# Patient Record
Sex: Female | Born: 1940 | Race: Black or African American | Hispanic: No | Smoking: Never smoker
Health system: Southern US, Community
[De-identification: ages and names within clinical notes are randomized; demographics above are authoritative.]

## PROBLEM LIST (undated history)

## (undated) DIAGNOSIS — E119 Type 2 diabetes mellitus without complications: Secondary | ICD-10-CM

## (undated) DIAGNOSIS — Z8639 Personal history of other endocrine, nutritional and metabolic disease: Secondary | ICD-10-CM

## (undated) DIAGNOSIS — D6859 Other primary thrombophilia: Secondary | ICD-10-CM

## (undated) DIAGNOSIS — Z86018 Personal history of other benign neoplasm: Secondary | ICD-10-CM

## (undated) DIAGNOSIS — I509 Heart failure, unspecified: Secondary | ICD-10-CM

## (undated) DIAGNOSIS — Z992 Dependence on renal dialysis: Secondary | ICD-10-CM

## (undated) DIAGNOSIS — N186 End stage renal disease: Secondary | ICD-10-CM

## (undated) DIAGNOSIS — I10 Essential (primary) hypertension: Secondary | ICD-10-CM

## (undated) HISTORY — PX: CHOLECYSTECTOMY: SHX55

## (undated) HISTORY — PX: DIALYSIS FISTULA CREATION: SHX611

## (undated) HISTORY — PX: IVC FILTER INSERTION: CATH118245

## (undated) HISTORY — PX: PORTACATH PLACEMENT: SHX2246

---

## 2013-03-29 DIAGNOSIS — Z86711 Personal history of pulmonary embolism: Secondary | ICD-10-CM | POA: Insufficient documentation

## 2013-03-29 DIAGNOSIS — I119 Hypertensive heart disease without heart failure: Secondary | ICD-10-CM | POA: Insufficient documentation

## 2017-03-01 ENCOUNTER — Emergency Department (HOSPITAL_COMMUNITY): Payer: Medicare Other

## 2017-03-01 ENCOUNTER — Encounter (HOSPITAL_COMMUNITY): Payer: Self-pay | Admitting: Emergency Medicine

## 2017-03-01 ENCOUNTER — Observation Stay (HOSPITAL_COMMUNITY)
Admission: EM | Admit: 2017-03-01 | Discharge: 2017-03-04 | Disposition: A | Discharge status: Home / Self Care | Payer: Medicare Other | Attending: Internal Medicine | Admitting: Internal Medicine

## 2017-03-01 DIAGNOSIS — Z79899 Other long term (current) drug therapy: Secondary | ICD-10-CM | POA: Insufficient documentation

## 2017-03-01 DIAGNOSIS — R55 Syncope and collapse: Secondary | ICD-10-CM | POA: Diagnosis present

## 2017-03-01 DIAGNOSIS — N289 Disorder of kidney and ureter, unspecified: Secondary | ICD-10-CM | POA: Diagnosis not present

## 2017-03-01 DIAGNOSIS — Z6841 Body Mass Index (BMI) 40.0 and over, adult: Secondary | ICD-10-CM | POA: Insufficient documentation

## 2017-03-01 DIAGNOSIS — G459 Transient cerebral ischemic attack, unspecified: Secondary | ICD-10-CM | POA: Diagnosis not present

## 2017-03-01 DIAGNOSIS — I10 Essential (primary) hypertension: Secondary | ICD-10-CM | POA: Diagnosis present

## 2017-03-01 DIAGNOSIS — N184 Chronic kidney disease, stage 4 (severe): Secondary | ICD-10-CM | POA: Diagnosis not present

## 2017-03-01 DIAGNOSIS — I130 Hypertensive heart and chronic kidney disease with heart failure and stage 1 through stage 4 chronic kidney disease, or unspecified chronic kidney disease: Secondary | ICD-10-CM | POA: Diagnosis not present

## 2017-03-01 DIAGNOSIS — R21 Rash and other nonspecific skin eruption: Secondary | ICD-10-CM | POA: Diagnosis not present

## 2017-03-01 DIAGNOSIS — R531 Weakness: Secondary | ICD-10-CM | POA: Diagnosis not present

## 2017-03-01 DIAGNOSIS — Z86711 Personal history of pulmonary embolism: Secondary | ICD-10-CM | POA: Insufficient documentation

## 2017-03-01 DIAGNOSIS — I081 Rheumatic disorders of both mitral and tricuspid valves: Secondary | ICD-10-CM | POA: Insufficient documentation

## 2017-03-01 DIAGNOSIS — Z794 Long term (current) use of insulin: Secondary | ICD-10-CM | POA: Diagnosis not present

## 2017-03-01 DIAGNOSIS — R0602 Shortness of breath: Secondary | ICD-10-CM | POA: Insufficient documentation

## 2017-03-01 DIAGNOSIS — R4781 Slurred speech: Secondary | ICD-10-CM | POA: Diagnosis not present

## 2017-03-01 DIAGNOSIS — Z8249 Family history of ischemic heart disease and other diseases of the circulatory system: Secondary | ICD-10-CM | POA: Diagnosis not present

## 2017-03-01 DIAGNOSIS — Z7982 Long term (current) use of aspirin: Secondary | ICD-10-CM | POA: Diagnosis not present

## 2017-03-01 DIAGNOSIS — Z86718 Personal history of other venous thrombosis and embolism: Secondary | ICD-10-CM | POA: Diagnosis not present

## 2017-03-01 DIAGNOSIS — I5022 Chronic systolic (congestive) heart failure: Secondary | ICD-10-CM | POA: Insufficient documentation

## 2017-03-01 DIAGNOSIS — E1122 Type 2 diabetes mellitus with diabetic chronic kidney disease: Secondary | ICD-10-CM | POA: Insufficient documentation

## 2017-03-01 DIAGNOSIS — I5032 Chronic diastolic (congestive) heart failure: Secondary | ICD-10-CM | POA: Diagnosis present

## 2017-03-01 DIAGNOSIS — I951 Orthostatic hypotension: Secondary | ICD-10-CM | POA: Diagnosis not present

## 2017-03-01 DIAGNOSIS — E118 Type 2 diabetes mellitus with unspecified complications: Secondary | ICD-10-CM | POA: Diagnosis present

## 2017-03-01 DIAGNOSIS — N183 Chronic kidney disease, stage 3 (moderate): Secondary | ICD-10-CM | POA: Diagnosis not present

## 2017-03-01 HISTORY — DX: Personal history of other benign neoplasm: Z86.018

## 2017-03-01 HISTORY — DX: Personal history of other endocrine, nutritional and metabolic disease: Z86.39

## 2017-03-01 HISTORY — DX: Essential (primary) hypertension: I10

## 2017-03-01 HISTORY — DX: Type 2 diabetes mellitus without complications: E11.9

## 2017-03-01 HISTORY — DX: Heart failure, unspecified: I50.9

## 2017-03-01 LAB — CBC
HCT: 32.7 % — ABNORMAL LOW (ref 36.0–46.0)
Hemoglobin: 10.5 g/dL — ABNORMAL LOW (ref 12.0–15.0)
MCH: 24.4 pg — ABNORMAL LOW (ref 26.0–34.0)
MCHC: 32.1 g/dL (ref 30.0–36.0)
MCV: 76 fL — ABNORMAL LOW (ref 78.0–100.0)
Platelets: 186 10*3/uL (ref 150–400)
RBC: 4.3 MIL/uL (ref 3.87–5.11)
RDW: 15.3 % (ref 11.5–15.5)
WBC: 6.5 10*3/uL (ref 4.0–10.5)

## 2017-03-01 LAB — COMPREHENSIVE METABOLIC PANEL
ALT: 7 U/L — ABNORMAL LOW (ref 14–54)
AST: 11 U/L — ABNORMAL LOW (ref 15–41)
Albumin: 2.8 g/dL — ABNORMAL LOW (ref 3.5–5.0)
Alkaline Phosphatase: 84 U/L (ref 38–126)
Anion gap: 6 (ref 5–15)
BUN: 18 mg/dL (ref 6–20)
CO2: 22 mmol/L (ref 22–32)
Calcium: 8.5 mg/dL — ABNORMAL LOW (ref 8.9–10.3)
Chloride: 112 mmol/L — ABNORMAL HIGH (ref 101–111)
Creatinine, Ser: 3.15 mg/dL — ABNORMAL HIGH (ref 0.44–1.00)
GFR calc Af Amer: 16 mL/min — ABNORMAL LOW (ref >60)
GFR calc non Af Amer: 13 mL/min — ABNORMAL LOW (ref >60)
Glucose, Bld: 88 mg/dL (ref 65–99)
Potassium: 4.1 mmol/L (ref 3.5–5.1)
Sodium: 140 mmol/L (ref 135–145)
Total Bilirubin: 0.4 mg/dL (ref 0.3–1.2)
Total Protein: 6.1 g/dL — ABNORMAL LOW (ref 6.5–8.1)

## 2017-03-01 LAB — URINALYSIS, ROUTINE W REFLEX MICROSCOPIC
Bacteria, UA: NONE SEEN
Bilirubin Urine: NEGATIVE
Glucose, UA: 50 mg/dL — AB
Ketones, ur: NEGATIVE mg/dL
Nitrite: NEGATIVE
Protein, ur: >=300 mg/dL — AB
Specific Gravity, Urine: 1.007 (ref 1.005–1.030)
pH: 6 (ref 5.0–8.0)

## 2017-03-01 LAB — RAPID URINE DRUG SCREEN, HOSP PERFORMED
Amphetamines: NOT DETECTED
Barbiturates: NOT DETECTED
Benzodiazepines: NOT DETECTED
Cocaine: NOT DETECTED
Opiates: NOT DETECTED
Tetrahydrocannabinol: NOT DETECTED

## 2017-03-01 LAB — DIFFERENTIAL
Basophils Absolute: 0 10*3/uL (ref 0.0–0.1)
Basophils Relative: 0 %
Eosinophils Absolute: 0.3 10*3/uL (ref 0.0–0.7)
Eosinophils Relative: 4 %
Lymphocytes Relative: 23 %
Lymphs Abs: 1.5 10*3/uL (ref 0.7–4.0)
Monocytes Absolute: 0.5 10*3/uL (ref 0.1–1.0)
Monocytes Relative: 8 %
Neutro Abs: 4.2 10*3/uL (ref 1.7–7.7)
Neutrophils Relative %: 65 %

## 2017-03-01 LAB — APTT: aPTT: 29 seconds (ref 24–36)

## 2017-03-01 LAB — PROTIME-INR
INR: 1.02
Prothrombin Time: 13.3 seconds (ref 11.4–15.2)

## 2017-03-01 LAB — CBG MONITORING, ED: Glucose-Capillary: 71 mg/dL (ref 65–99)

## 2017-03-01 LAB — I-STAT TROPONIN, ED: Troponin i, poc: 0.03 ng/mL (ref 0.00–0.08)

## 2017-03-01 LAB — ETHANOL: Alcohol, Ethyl (B): <5 mg/dL (ref <5)
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
## 2017-03-01 NOTE — H&P (Signed)
Ashlee Miles TGY:[id number] DOB: 12/30/1940 DOA: 03/01/2017     PCP: Darlina Rumpf, MD   Outpatient Specialists: [facility] local Patient coming from:   home Lives   With family    Chief Complaint: Syncope with Possible Left side weakness  HPI: Ashlee Miles is a 76 y.o. female with medical history significant of CHF, DM 2, CKD, HTN, hx of pituitary adenoma    Presented with sudden onset of leaning to left   left arm weakness garbled speech started at 4:30 PM  witnessed by her family. Patient got up stated she did not feel well. She was generally weak and then was weaker on the left. She has been saying some words back wards. Family is worried that she has UTI because she was a bit confused. She has a rush under her breast.  originally from [place] coming to [place] due to [facility]  She has improved currently. Denies chest pain but had some  shortness of breath. This AM her BG was 302. Remote hx of PE 8 years ago no longer on anticoagulation.  Family reports fluctuating BG up to 300 this AM and down to 70 at other times. She has rash under her breasts and panus bilaterally.  Regarding pertinent Chronic problems:  Patient has a port on her right chest wall due to history of poor axis CKD states in the past she had had fistulous place but they have clotted  IN ER:  Temp (24hrs), Avg:97.6 F (36.4 C), Min:97.6 F (36.4 C), Max:97.6 F (36.4 C)      on arrival  ED Triage Vitals  Enc Vitals Group     BP 03/01/17 1734 (!) 144/128     Pulse Rate 03/01/17 1734 65     Resp 03/01/17 1734 11     Temp 03/01/17 1739 97.6 F (36.4 C)     Temp Source 03/01/17 1739 Oral     SpO2 03/01/17 1734 98 %     Weight 03/01/17 1749 255 lb (115.7 kg)     Height 03/01/17 1749 5\' 2"  (1.575 m)     Head Circumference --      Peak Flow --      Pain Score 03/01/17 1756 10     Pain Loc --      Pain Edu? --      Excl. in [place]? --     Latest RR 18 99% HR 72 BP 173/98 Trop 0.03 INR 1.02 WBC 6.5 Hg  10.5 Na 140 K 4.1 Cr 3.15 Alb 2.8 CBG 71 CT head: Non acute  Following Medications were ordered in ER: Medications - No data to display     Hospitalist was called for admission for  TIA vs syncope  Review of Systems:    Pertinent positives include:  fatigue,  localizing neurological complaints  Constitutional:  No weight loss, night sweats, Fevers, chills,weight loss  HEENT:  No headaches, Difficulty swallowing,Tooth/dental problems,Sore throat,  No sneezing, itching, ear ache, nasal congestion, post nasal drip,  Cardio-vascular:  No chest pain, Orthopnea, PND, anasarca, dizziness, palpitations.no Bilateral lower extremity swelling  GI:  No heartburn, indigestion, abdominal pain, nausea, vomiting, diarrhea, change in bowel habits, loss of appetite, melena, blood in stool, hematemesis Resp:  no shortness of breath at rest. No dyspnea on exertion, No excess mucus, no productive cough, No non-productive cough, No coughing up of blood.No change in color of mucus.No wheezing. Skin:  no rash or lesions. No jaundice GU:  no dysuria, change in color of  urine, no urgency or frequency. No straining to urinate.  No flank pain.  Musculoskeletal:  No joint pain or no joint swelling. No decreased range of motion. No back pain.  Psych:  No change in mood or affect. No depression or anxiety. No memory loss.  Neuro: no, no tingling, no weakness, no double vision, no gait abnormality, no slurred speech, no confusion  As per HPI otherwise 10 point review of systems negative.   Past Medical History: Past Medical History:  Diagnosis Date  . CHF (congestive heart failure) ([place])   . Diabetes mellitus without complication ([place])   . History of pituitary adenoma   . Hypertension   . Renal disorder    Past Surgical History:  Procedure Laterality Date  . CHOLECYSTECTOMY       Social History:  Ambulatory   independently cane or  walker     reports that she has never smoked. She has never  used smokeless tobacco. She reports that she does not drink alcohol or use drugs.  Allergies:  No Known Allergies     Family History:   Family History  Problem Relation Age of Onset  . Breast cancer Mother   . Diabetes Sister   . Diabetes Brother   . CAD Other   . Stroke Neg Hx     Medications: Prior to Admission medications   Medication Sig Start Date End Date Taking? Authorizing Provider  amLODipine (NORVASC) 10 MG tablet Take 10 mg by mouth at bedtime.   Yes [provider]  calcitRIOL (ROCALTROL) 0.5 MCG capsule Take 0.5 mcg by mouth daily.   Yes [provider]  carvedilol (COREG) 25 MG tablet Take 25 mg by mouth 2 (two) times daily with a meal.   Yes [provider]  cyclobenzaprine (FLEXERIL) 10 MG tablet Take 10 mg by mouth as needed for muscle spasms.   Yes [provider]  furosemide (LASIX) 80 MG tablet Take 80 mg by mouth 2 (two) times daily.   Yes [provider]  gabapentin (NEURONTIN) 300 MG capsule Take 300 mg by mouth 3 (three) times daily.   Yes [provider]  hydrochlorothiazide (HYDRODIURIL) 50 MG tablet Take 50 mg by mouth 3 (three) times daily.   Yes [provider]  HYDROcodone-acetaminophen (NORCO) 7.5-325 MG tablet Take 1 tablet by mouth every 6 (six) hours as needed for moderate pain.   Yes [provider]  insulin aspart protamine- aspart (NOVOLOG MIX 70/30) (70-30) 100 UNIT/ML injection Inject 40 Units into the skin daily with breakfast.   Yes [provider]  linaclotide (LINZESS) 145 MCG CAPS capsule Take 145 mcg by mouth daily before breakfast.   Yes [provider]  losartan (COZAAR) 25 MG tablet Take 25 mg by mouth daily.   Yes [provider]  omeprazole (PRILOSEC) 20 MG capsule Take 20 mg by mouth daily.   Yes [provider]  potassium chloride (KLOR-CON) 20 MEQ packet Take 20 mEq by mouth daily.   Yes [provider]  ranitidine  (ZANTAC) 150 MG tablet Take 150 mg by mouth 2 (two) times daily.   Yes [provider]  simvastatin (ZOCOR) 20 MG tablet Take 20 mg by mouth daily.   Yes [provider]  triamcinolone cream (KENALOG) 0.1 % Apply 1 application topically 2 (two) times daily. Use on her hand   Yes [provider]    Physical Exam: Patient Vitals for the past 24 hrs:  BP Temp Temp src  Pulse Resp SpO2 Height Weight  03/01/17 2230 - - - 65 19 100 % - -  03/01/17 2215 (!) 120/99 - - 73 (!) 21 97 % - -  03/01/17 2200 (!) 155/83 - - 61 16 98 % - -  03/01/17 2145 (!) 165/95 - - 61 18 100 % - -  03/01/17 2130 (!) 149/72 - - (!) 59 16 100 % - -  03/01/17 2115 (!) 156/72 - - (!) 59 17 100 % - -  03/01/17 2100 (!) 170/68 - - 62 20 100 % - -  03/01/17 2045 (!) 183/94 - - 67 18 98 % - -  03/01/17 2030 (!) 172/89 - - 69 17 100 % - -  03/01/17 2015 (!) 148/99 - - 63 17 100 % - -  03/01/17 2000 - - - 63 18 100 % - -  03/01/17 1953 - 97.6 F (36.4 C) - - - - - -  03/01/17 1945 (!) 165/84 - - 60 18 99 % - -  03/01/17 1930 - - - 62 13 100 % - -  03/01/17 1915 (!) 170/86 - - 62 16 100 % - -  03/01/17 1900 - - - 69 (!) 23 100 % - -  03/01/17 1845 (!) 168/78 - - 65 19 99 % - -  03/01/17 1830 (!) 164/67 - - 60 19 100 % - -  03/01/17 1815 (!) 162/75 - - - 18 - - -  03/01/17 1800 (!) 169/74 - - 61 18 98 % - -  03/01/17 1749 - - - - - - 5\' 2"  (1.575 m) 115.7 kg (255 lb)  03/01/17 1745 (!) 155/69 - - 68 (!) 22 98 % - -  03/01/17 1743 - - - - - 97 % - -  03/01/17 1740 - - - 69 (!) 21 98 % - -  03/01/17 1739 (!) 155/69 97.6 F (36.4 C) Oral 64 20 98 % - -  03/01/17 1736 - - - 68 15 100 % - -  03/01/17 1734 (!) 144/128 - - 65 11 98 % - -    1. General:  in No Acute distress  Chronically ill  -appearing 2. Psychological: Alert and  Oriented 3. Head/ENT:     Dry Mucous Membranes                          Head Non traumatic, neck supple                         Poor Dentition 4. SKIN:   decreased  Skin turgor,  Skin clean Dry and intact no rash 5. Heart: Regular rate and rhythm no  Murmur, no Rub or gallop 6. Lungs: no wheezes or crackles   7. Abdomen: Soft,   non-tender, Non distended  Obese bowel sounds present 8. Lower extremities: no clubbing, cyanosis, or edema 9. Neurologically   strength 5 out of 5 in all 4 extremities cranial nerves II through XII intact 10. MSK: Normal range of motion   body mass index is 46.64 kg/m.  Labs on Admission:   Labs on Admission: I have personally reviewed following labs and imaging studies  CBC:  Recent Labs Lab 03/01/17 2051  WBC 6.5  NEUTROABS 4.2  HGB 10.5*  HCT 32.7*  MCV 76.0*  PLT 326   Basic Metabolic Panel:  Recent Labs Lab 03/01/17 2051  NA 140  K 4.1  CL 112*  CO2 22  GLUCOSE 88  BUN 18  CREATININE 3.15*  CALCIUM 8.5*   GFR: Estimated Creatinine Clearance: 18.6 mL/min (A) (by C-G formula based on SCr of 3.15 mg/dL (H)). Liver Function Tests:  Recent Labs Lab 03/01/17 2051  AST 11*  ALT 7*  ALKPHOS 84  BILITOT 0.4  PROT 6.1*  ALBUMIN 2.8*   No results for input(s): LIPASE, AMYLASE in the last 168 hours. No results for input(s): AMMONIA in the last 168 hours. Coagulation Profile:  Recent Labs Lab 03/01/17 2051  INR 1.02   Cardiac Enzymes: No results for input(s): CKTOTAL, CKMB, CKMBINDEX, TROPONINI in the last 168 hours. BNP (last 3 results) No results for input(s): PROBNP in the last 8760 hours. HbA1C: No results for input(s): HGBA1C in the last 72 hours. CBG:  Recent Labs Lab 03/01/17 1932  GLUCAP 71   Lipid Profile: No results for input(s): CHOL, HDL, LDLCALC, TRIG, CHOLHDL, LDLDIRECT in the last 72 hours. Thyroid Function Tests: No results for input(s): TSH, T4TOTAL, FREET4, T3FREE, THYROIDAB in the last 72 hours. Anemia Panel: No results for input(s): VITAMINB12, FOLATE, FERRITIN, TIBC, IRON, RETICCTPCT in the last 72 hours. Urine analysis:    Component Value Date/Time    COLORURINE STRAW (A) 03/01/2017 2100   APPEARANCEUR CLEAR 03/01/2017 2100   LABSPEC 1.007 03/01/2017 2100   PHURINE 6.0 03/01/2017 2100   GLUCOSEU 50 (A) 03/01/2017 2100   HGBUR SMALL (A) 03/01/2017 2100   BILIRUBINUR NEGATIVE 03/01/2017 2100   KETONESUR NEGATIVE 03/01/2017 2100   PROTEINUR >=300 (A) 03/01/2017 2100   NITRITE NEGATIVE 03/01/2017 2100   LEUKOCYTESUR TRACE (A) 03/01/2017 2100   Sepsis Labs: @LABRCNTIP (procalcitonin:4,lacticidven:4) )No results found for this or any previous visit (from the past 240 hour(s)).     UA Pyuria  No results found for: HGBA1C  Estimated Creatinine Clearance: 18.6 mL/min (A) (by C-G formula based on SCr of 3.15 mg/dL (H)).  BNP (last 3 results) No results for input(s): PROBNP in the last 8760 hours.   ECG REPORT  Independently reviewed Rate: 64  Rhythm: NSR ST&T Change: No acute ischemic changes   QTC 430  Filed Weights   03/01/17 1749  Weight: 115.7 kg (255 lb)     Cultures: No results found for: SDES, SPECREQUEST, CULT, REPTSTATUS   Radiological Exams on Admission: Dg Chest 2 View  Result Date: 03/01/2017 CLINICAL DATA:  Weakness EXAM: CHEST  2 VIEW COMPARISON:  None. FINDINGS: The mediastinal contour is normal. The heart size is enlarged. Right central venous line is identified distal tip in the superior vena cava. The lung volumes are low. There is no focal infiltrate, pulmonary edema, or pleural effusion. No acute abnormality is identified within the visualized bones. IMPRESSION: Cardiomegaly.  Low lung volumes.  The lungs are otherwise clear. Electronically Signed   By: Abelardo Diesel M.D.   On: 03/01/2017 18:56   Ct Head Wo Contrast  Result Date: 03/01/2017 CLINICAL DATA:  Slurred speech pain inability to move the left arm,onset at 1632 hours now resolved. Patient was leaning to the left. EXAM: CT HEAD WITHOUT CONTRAST TECHNIQUE: Contiguous axial images were obtained from the base of the skull through the vertex without  intravenous contrast. COMPARISON:  None. FINDINGS: Brain: No evidence of acute large vessel territory infarction, hemorrhage, hydrocephalus, extra-axial collection or mass lesion/mass effect. Age-indeterminate but likely chronic minimal small vessel ischemic disease of deep white matter. Vascular: Mild-to-moderate atherosclerosis of the carotid siphons. No hyperdense appearing vessels. Skull: Hyperostosis frontalis interna of  the skull. No acute fracture bone destruction. Sinuses/Orbits: No acute finding. Other: Small left mastoid effusion. IMPRESSION: 1. No acute intracranial abnormality. 2. Minimally age-indeterminate small vessel ischemic changes periventricular white matter in the absence of prior studies for comparison. However given the moderate degree of atherosclerotic calcifications at the skullbase, findings are more likely to be chronic. 3. Small left mastoid effusion. Electronically Signed   By: Ashley Royalty M.D.   On: 03/01/2017 19:24    Chart has been reviewed    Assessment/Plan   76 y.o. female with medical history significant of CHF, DM 2, CKD, HTN, hx of pituitary adenoma  Admitted for TIA vs Syncope  Present on Admission: . TIA (transient ischemic attack) -  - will admit based on TIA/CVA protocol, await results of MRA/MRI, Carotid Doppler and Echo, obtain cardiac enzymes,  ECG,   Lipid panel, TSH. Order PT/OT evaluation. Will make sure patient is on antiplatelet agent.   Neurology consulted.      . Syncope - her CTA unsure if she ever completely loss consciousness obtain echogram cycle cardiac enzymes monitoring telemetry. Given somewhat soft blood sugars hold NPH for tonight need to reassess and perhaps decrease dose of NPH going home . CKD (chronic kidney disease), stage III -unclear baseline continue to monitor creatinine . DM type 2 causing CKD stage 3 (HCC) order SSI, hold NPH for tonight given relative hypoglycemia may need lower dose at discahrge . Essential hypertension  allow permissive hypertension for tonight given TIA . Chronic systolic CHF (congestive heart failure), NYHA class 2 (HCC) - obtain echogram given occasional episodes of dyspnea currently does not appear to be fluid overloaded hold Lasix for tonight given possible TIA and monitor for fluid overload restart when able prior to discharge  History of PE/DVT remote currently not on anticoagulation denies any chest pain  History of pituitary on adenoma family states has been just monitored nonacute Candidal infection of the skin. Treat  with nystatin powder  Other plan as per orders.  DVT prophylaxis:  SCD    Code Status:   DNR/DNI   as per patient    Family Communication:   Family   at  Bedside  plan of care was discussed with Daughter  Disposition Plan:     To home once workup is complete and patient is stable                    Would benefit from PT/OT eval prior to DC  ordered                                               Consults called: NEUROLOGy  Admission status:   obs   Level of care  tele            I have spent a total of 66 min on this admission   extra time was spent to discuss case with consultants  Ashlee Miles 03/02/2017, 12:59 AM    [facility]  Pager [phone]   after 2 AM please page floor coverage PA If 7AM-7PM, please contact the day team taking care of the patient  [email or web address]  Password [email or web address]

## 2017-03-01 NOTE — ED Notes (Addendum)
Pt's daughter is at bedside and states pt's CBG was 302 this morning. Per daughter pt took 40 units of insulin (Novalog) this am. Pt's daughter also states she believes pt has a UTI.

## 2017-03-01 NOTE — ED Notes (Signed)
Gave pt sandwich and OJ

## 2017-03-02 ENCOUNTER — Observation Stay (HOSPITAL_BASED_OUTPATIENT_CLINIC_OR_DEPARTMENT_OTHER): Payer: Medicare Other

## 2017-03-02 ENCOUNTER — Observation Stay (HOSPITAL_COMMUNITY): Payer: Medicare Other

## 2017-03-02 DIAGNOSIS — I340 Nonrheumatic mitral (valve) insufficiency: Secondary | ICD-10-CM

## 2017-03-02 DIAGNOSIS — N183 Chronic kidney disease, stage 3 (moderate): Secondary | ICD-10-CM | POA: Diagnosis not present

## 2017-03-02 DIAGNOSIS — N184 Chronic kidney disease, stage 4 (severe): Secondary | ICD-10-CM | POA: Diagnosis not present

## 2017-03-02 DIAGNOSIS — I10 Essential (primary) hypertension: Secondary | ICD-10-CM | POA: Diagnosis not present

## 2017-03-02 DIAGNOSIS — G459 Transient cerebral ischemic attack, unspecified: Secondary | ICD-10-CM | POA: Diagnosis not present

## 2017-03-02 DIAGNOSIS — R531 Weakness: Secondary | ICD-10-CM | POA: Diagnosis not present

## 2017-03-02 DIAGNOSIS — Z794 Long term (current) use of insulin: Secondary | ICD-10-CM | POA: Diagnosis not present

## 2017-03-02 DIAGNOSIS — I130 Hypertensive heart and chronic kidney disease with heart failure and stage 1 through stage 4 chronic kidney disease, or unspecified chronic kidney disease: Secondary | ICD-10-CM | POA: Diagnosis not present

## 2017-03-02 DIAGNOSIS — E1122 Type 2 diabetes mellitus with diabetic chronic kidney disease: Secondary | ICD-10-CM | POA: Diagnosis not present

## 2017-03-02 DIAGNOSIS — R4781 Slurred speech: Secondary | ICD-10-CM | POA: Diagnosis not present

## 2017-03-02 LAB — ECHOCARDIOGRAM COMPLETE
Ao-asc: 31 cm
E decel time: 246 msec
FS: 31 % (ref 28–44)
Height: 62 in
IVS/LV PW RATIO, ED: 1.18
LA ID, A-P, ES: 37 mm
LA diam end sys: 37 mm
LA diam index: 1.69 cm/m2
LA vol A4C: 38.8 ml
LA vol index: 17.1 mL/m2
LA vol: 37.5 mL
LV PW d: 11 mm — AB (ref 0.6–1.1)
LV e' LATERAL: 7.3 cm/s
LVOT area: 2.54 cm2
LVOT diameter: 18 mm
MV Dec: 246
MV pk E vel: 0.8 m/s
PV Reg vel dias: 70.8 cm/s
Reg peak vel: 262 cm/s
TDI e' lateral: 7.3
TDI e' medial: 5.27
TR max vel: 262 cm/s
Weight: 3660.8 oz

## 2017-03-02 LAB — POCT I-STAT, CHEM 8
BUN: 21 mg/dL — ABNORMAL HIGH (ref 6–20)
Calcium, Ion: 1.22 mmol/L (ref 1.15–1.40)
Chloride: 112 mmol/L — ABNORMAL HIGH (ref 101–111)
Creatinine, Ser: 3.2 mg/dL — ABNORMAL HIGH (ref 0.44–1.00)
Glucose, Bld: 87 mg/dL (ref 65–99)
HCT: 34 % — ABNORMAL LOW (ref 36.0–46.0)
Hemoglobin: 11.6 g/dL — ABNORMAL LOW (ref 12.0–15.0)
Potassium: 4.2 mmol/L (ref 3.5–5.1)
Sodium: 144 mmol/L (ref 135–145)
TCO2: 22 mmol/L (ref 22–32)

## 2017-03-02 LAB — GLUCOSE, CAPILLARY
Glucose-Capillary: 163 mg/dL — ABNORMAL HIGH (ref 65–99)
Glucose-Capillary: 158 mg/dL — ABNORMAL HIGH (ref 65–99)
Glucose-Capillary: 158 mg/dL — ABNORMAL HIGH (ref 65–99)
Glucose-Capillary: 169 mg/dL — ABNORMAL HIGH (ref 65–99)
Glucose-Capillary: 186 mg/dL — ABNORMAL HIGH (ref 65–99)

## 2017-03-02 LAB — LIPID PANEL
Cholesterol: 195 mg/dL (ref 0–200)
HDL: 38 mg/dL — ABNORMAL LOW (ref >40)
LDL Cholesterol: 127 mg/dL — ABNORMAL HIGH (ref 0–99)
Total CHOL/HDL Ratio: 5.1 RATIO
Triglycerides: 151 mg/dL — ABNORMAL HIGH (ref <150)
VLDL: 30 mg/dL (ref 0–40)

## 2017-03-02 LAB — TROPONIN I
Troponin I: 0.03 ng/mL (ref <0.03)
Troponin I: <0.03 ng/mL (ref <0.03)

## 2017-03-02 LAB — HEMOGLOBIN A1C
Hgb A1c MFr Bld: 7.2 % — ABNORMAL HIGH (ref 4.8–5.6)
Mean Plasma Glucose: 159.94 mg/dL

## 2017-03-02 MED ORDER — PANTOPRAZOLE SODIUM 40 MG PO TBEC
40.0000 mg | DELAYED_RELEASE_TABLET | Freq: Every day | ORAL | Status: DC
  Administered 2017-03-02 – 2017-03-04 (×3): 40 mg via ORAL
  Filled 2017-03-02 (×3): qty 1

## 2017-03-02 MED ORDER — SIMVASTATIN 20 MG PO TABS
20.0000 mg | ORAL_TABLET | Freq: Every day | ORAL | Status: DC
  Administered 2017-03-02: 20 mg via ORAL
  Filled 2017-03-02: qty 1

## 2017-03-02 MED ORDER — SODIUM CHLORIDE 0.9% FLUSH
10.0000 mL | INTRAVENOUS | Status: DC | PRN
  Administered 2017-03-02: 15 mL
  Filled 2017-03-02: qty 40

## 2017-03-02 MED ORDER — ENSURE ENLIVE PO LIQD: 237.0000 mL | Freq: Two times a day (BID) | ORAL | Status: DC

## 2017-03-02 MED ORDER — ASPIRIN 325 MG PO TABS
325.0000 mg | ORAL_TABLET | Freq: Every day | ORAL | Status: DC
  Administered 2017-03-02 – 2017-03-04 (×3): 325 mg via ORAL
  Filled 2017-03-02 (×3): qty 1

## 2017-03-02 MED ORDER — STROKE: EARLY STAGES OF RECOVERY BOOK
Freq: Once | Status: DC
  Filled 2017-03-02: qty 1

## 2017-03-02 MED ORDER — ACETAMINOPHEN 325 MG PO TABS: 650.0000 mg | ORAL_TABLET | ORAL | Status: DC | PRN

## 2017-03-02 MED ORDER — CALCITRIOL 0.5 MCG PO CAPS
0.5000 ug | ORAL_CAPSULE | Freq: Every day | ORAL | Status: DC
  Administered 2017-03-02 – 2017-03-04 (×3): 0.5 ug via ORAL
  Filled 2017-03-02: qty 1
  Filled 2017-03-02 (×3): qty 2
  Filled 2017-03-02 (×2): qty 1
  Filled 2017-03-02: qty 2

## 2017-03-02 MED ORDER — ACETAMINOPHEN 160 MG/5ML PO SOLN: 650.0000 mg | ORAL | Status: DC | PRN

## 2017-03-02 MED ORDER — ASPIRIN 300 MG RE SUPP: 300.0000 mg | Freq: Every day | RECTAL | Status: DC

## 2017-03-02 MED ORDER — GABAPENTIN 300 MG PO CAPS
300.0000 mg | ORAL_CAPSULE | Freq: Three times a day (TID) | ORAL | Status: DC
  Administered 2017-03-02 – 2017-03-04 (×8): 300 mg via ORAL
  Filled 2017-03-02 (×9): qty 1

## 2017-03-02 MED ORDER — DIPHENHYDRAMINE HCL 50 MG/ML IJ SOLN: 12.5000 mg | Freq: Three times a day (TID) | INTRAMUSCULAR | Status: DC | PRN

## 2017-03-02 MED ORDER — ACETAMINOPHEN 650 MG RE SUPP
650.0000 mg | RECTAL | Status: DC | PRN
Start: 2017-03-02 — End: 2017-03-04

## 2017-03-02 MED ORDER — INSULIN ASPART 100 UNIT/ML ~~LOC~~ SOLN
0.0000 [IU] | SUBCUTANEOUS | Status: DC
  Administered 2017-03-02 (×5): 2 [IU] via SUBCUTANEOUS
  Administered 2017-03-03: 5 [IU] via SUBCUTANEOUS
  Administered 2017-03-03: 1 [IU] via SUBCUTANEOUS
  Administered 2017-03-03: 5 [IU] via SUBCUTANEOUS
  Administered 2017-03-03: 3 [IU] via SUBCUTANEOUS
  Administered 2017-03-04: 1 [IU] via SUBCUTANEOUS
  Administered 2017-03-04: 2 [IU] via SUBCUTANEOUS
  Administered 2017-03-04: 3 [IU] via SUBCUTANEOUS

## 2017-03-02 MED ORDER — PERFLUTREN LIPID MICROSPHERE
1.0000 mL | INTRAVENOUS | Status: AC | PRN
  Administered 2017-03-02: 6 mL via INTRAVENOUS
  Filled 2017-03-02: qty 10

## 2017-03-02 MED ORDER — PERFLUTREN LIPID MICROSPHERE
INTRAVENOUS | Status: AC
  Filled 2017-03-02: qty 10

## 2017-03-02 MED ORDER — SODIUM CHLORIDE 0.9% FLUSH
10.0000 mL | Freq: Two times a day (BID) | INTRAVENOUS | Status: DC
  Administered 2017-03-02 (×2): 10 mL

## 2017-03-02 MED ORDER — FAMOTIDINE 20 MG PO TABS
10.0000 mg | ORAL_TABLET | Freq: Every day | ORAL | Status: DC
  Administered 2017-03-02 – 2017-03-04 (×3): 10 mg via ORAL
  Filled 2017-03-02 (×3): qty 1

## 2017-03-02 MED ORDER — SODIUM CHLORIDE 0.9% FLUSH
10.0000 mL | INTRAVENOUS | Status: DC | PRN
  Administered 2017-03-02 (×2): 10 mL
  Administered 2017-03-02: 20 mL
  Administered 2017-03-03 – 2017-03-04 (×3): 10 mL
  Filled 2017-03-02 (×6): qty 40

## 2017-03-02 MED ORDER — CYCLOBENZAPRINE HCL 10 MG PO TABS
10.0000 mg | ORAL_TABLET | ORAL | Status: DC | PRN
  Administered 2017-03-03: 10 mg via ORAL
  Filled 2017-03-02: qty 1

## 2017-03-02 MED ORDER — SENNOSIDES-DOCUSATE SODIUM 8.6-50 MG PO TABS: 1.0000 | ORAL_TABLET | Freq: Every evening | ORAL | Status: DC | PRN

## 2017-03-02 MED ORDER — HYDROCODONE-ACETAMINOPHEN 7.5-325 MG PO TABS
1.0000 | ORAL_TABLET | Freq: Four times a day (QID) | ORAL | Status: DC | PRN
  Administered 2017-03-02 – 2017-03-04 (×5): 1 via ORAL
  Filled 2017-03-02 (×5): qty 1

## 2017-03-02 MED ORDER — NYSTATIN 100000 UNIT/GM EX POWD
Freq: Three times a day (TID) | CUTANEOUS | Status: DC
  Administered 2017-03-02 – 2017-03-04 (×7): via TOPICAL
  Filled 2017-03-02: qty 15
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
## 2017-03-02 NOTE — Evaluation (Signed)
Speech Language Pathology Evaluation Patient Details Name: Ashlee Miles MRN: [id number] DOB: 08-20-1940 Today's Date: 03/02/2017 Time: 0932-6712 SLP Time Calculation (min) (ACUTE ONLY): 17 min  Problem List:  Patient Active Problem List   Diagnosis Date Noted  . TIA (transient ischemic attack) 03/01/2017  . Syncope 03/01/2017  . CKD (chronic kidney disease), stage III 03/01/2017  . DM type 2 causing CKD stage 3 ([place]) 03/01/2017  . Essential hypertension 03/01/2017  . Chronic systolic CHF (congestive heart failure), NYHA class 2 ([place]) 03/01/2017   Past Medical History:  Past Medical History:  Diagnosis Date  . CHF (congestive heart failure) ([place])   . Diabetes mellitus without complication ([place])   . History of pituitary adenoma   . Hypertension   . Renal disorder    Past Surgical History:  Past Surgical History:  Procedure Laterality Date  . CHOLECYSTECTOMY     HPI:  Ptis a 76 y.o.femaleAfrican-American with PMH of morbidobesity, CHF, DM 2, CTD, hypertension, pituitary adenoma who is brought to the hospital for possible left-sided weakness. MRI negative for acute infarct.   Assessment / Plan / Recommendation Clinical Impression  Pt believes that she is nearing her baseline, but that her speech is mildly impaired. She is easily intelligible in conversation. She does have intermittent word-finding errors that she denies having at baseline, but she has good awareness and compensates well. She recalled 3 out of 4 words with Min cues but can describe events of her hospital stay well. At baseline she lives with family and has support from them and a home health aid. No acute SLP f/u indicated given clear MRI, rapidly improving symptoms, and adequate support for discharge. Should symptoms persist, she may benefit from [facility] SLP services.    SLP Assessment  SLP Recommendation/Assessment: All further Speech Lanaguage Pathology  needs can be addressed in the next venue of care SLP Visit  Diagnosis: Cognitive communication deficit (R41.841)    Follow Up Recommendations  Home health SLP;24 hour supervision/assistance (if symptoms don't resolve)    Frequency and Duration           SLP Evaluation Cognition  Overall Cognitive Status: History of cognitive impairments - at baseline Orientation Level: Oriented X4       Comprehension  Auditory Comprehension Overall Auditory Comprehension: Appears within functional limits for tasks assessed    Expression Expression Primary Mode of Expression: Verbal Verbal Expression Overall Verbal Expression: Impaired Initiation: No impairment Level of Generative/Spontaneous Verbalization: Conversation Naming: Impairment Verbal Errors: Other (comment);Aware of errors (anomia in conversation) Pragmatics: No impairment Non-Verbal Means of Communication: Not applicable   Oral / Motor  Oral Motor/Sensory Function Overall Oral Motor/Sensory Function: Mild impairment (subtle L sided weakness) Facial ROM: Reduced left;Suspected CN VII (facial) dysfunction Facial Symmetry: Abnormal symmetry left;Suspected CN VII (facial) dysfunction Facial Strength: Reduced left;Suspected CN VII (facial) dysfunction Motor Speech Overall Motor Speech: Impaired Respiration: Within functional limits Phonation: Normal Resonance: Within functional limits Articulation: Impaired Level of Impairment: Conversation Intelligibility: Intelligible   GO          Functional Assessment Tool Used: skilled clinical judgment Functional Limitations: Spoken language expressive Spoken Language Expression Current Status [phone]): At least 1 percent but less than 20 percent impaired, limited or restricted Spoken Language Expression Goal Status [phone]): At least 1 percent but less than 20 percent impaired, limited or restricted Spoken Language Expression Discharge Status [phone]): At least 1 percent but less than 20 percent impaired, limited or restricted         Inez,  Mickel Baas 03/02/2017, 1:59 PM  Germain Osgood, M.A. CCC-SLP [phone]
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
## 2017-03-02 NOTE — Progress Notes (Signed)
  Echocardiogram 2D Echocardiogram has been performed.  Randa Lynn Milta Croson 03/02/2017, 12:59 PM

## 2017-03-02 NOTE — Progress Notes (Signed)
PROGRESS NOTE    Ashlee Miles  ATF:[id number] DOB: 11/28/1940 DOA: 03/01/2017 PCP: Darlina Rumpf, MD    Brief Narrative:  Ashlee Miles is a 76 y.o. female with medical history significant of CHF, DM 2, CKD, HTN, hx of pituitary adenoma presented with sudden onset of leaning to left   left arm weakness garbled speech started at 4:30 PM on 9/12,  witnessed by her family.   Assessment & Plan:   Active Problems:   TIA (transient ischemic attack)   Syncope   CKD (chronic kidney disease), stage III   DM type 2 causing CKD stage 3 ([facility])   Essential hypertension    Transient left arm weakness and slurred speech:  Resolved on on my exam .  Probably TIA.  Neuro work up with negative MRI brain.  Neurology on board.  Therapy evals done and recommended home health PT. ECHO shows mild LVH, good LVEF, no wall motion abn, and grade 1 diastolic dysfunction.  On aspirin 325 mg  Lipid panel,  Resume zocor.   Stage 3 CKD: stable.   Hypertension: well controlled.    Diabetes mellitus: CBG (last 3)   Recent Labs  03/02/17 0749 03/02/17 1217 03/02/17 1654  GLUCAP 158* 158* 169*    Resume SSI.    Back pain;  Resume norco.     DVT prophylaxis: scd's Code Status: DNR Family Communication: NONE AT BEDSIDE.  Disposition Plan: pending further evaluation. Possibly home in am.   Consultants:   Neurology.   Procedures:ECHO   Antimicrobials: NONE.   Subjective: REPORTS back pain from laying down.   Objective: Vitals:   03/02/17 0700 03/02/17 0846 03/02/17 1152 03/02/17 1424  BP:  (!) 141/76 (!) 185/69 (!) 138/92  Pulse:  62 71 83  Resp:   18   Temp:  (!) 97.5 F (36.4 C) 98.2 F (36.8 C) (!) 97.5 F (36.4 C)  TempSrc:  Oral Oral Oral  SpO2:  99% 100% 100%  Weight: 103.8 kg (228 lb 12.8 oz)     Height: 5\' 2"  (1.575 m)       Intake/Output Summary (Last 24 hours) at 03/02/17 1527 Last data filed at 03/02/17 1310  Gross per 24 hour  Intake              230 ml    Output              800 ml  Net             -570 ml   Filed Weights   03/01/17 1749 03/02/17 0700  Weight: 115.7 kg (255 lb) 103.8 kg (228 lb 12.8 oz)    Examination:  General exam: Appears calm and comfortable  Respiratory system: Clear to auscultation. Respiratory effort normal. Cardiovascular system: S1 & S2 heard, RRR. No JVD, murmurs, rubs, gallops or clicks. No pedal edema. Gastrointestinal system: Abdomen is nondistended, soft and nontender. No organomegaly or masses felt. Normal bowel sounds heard. Central nervous system: Alert and oriented. No focal neurological deficits. Extremities: Symmetric 5 x 5 power. Skin: No rashes, lesions or ulcers Psychiatry: Judgement and insight appear normal. Mood & affect appropriate.     Data Reviewed: I have personally reviewed following labs and imaging studies  CBC:  Recent Labs Lab 03/01/17 2051 03/01/17 2102  WBC 6.5  --   NEUTROABS 4.2  --   HGB 10.5* 11.6*  HCT 32.7* 34.0*  MCV 76.0*  --   PLT 186  --    Basic Metabolic Panel:  Recent Labs Lab 03/01/17 2051 03/01/17 2102  NA 140 144  K 4.1 4.2  CL 112* 112*  CO2 22  --   GLUCOSE 88 87  BUN 18 21*  CREATININE 3.15* 3.20*  CALCIUM 8.5*  --    GFR: Estimated Creatinine Clearance: 17.2 mL/min (A) (by C-G formula based on SCr of 3.2 mg/dL (H)). Liver Function Tests:  Recent Labs Lab 03/01/17 2051  AST 11*  ALT 7*  ALKPHOS 84  BILITOT 0.4  PROT 6.1*  ALBUMIN 2.8*   No results for input(s): LIPASE, AMYLASE in the last 168 hours. No results for input(s): AMMONIA in the last 168 hours. Coagulation Profile:  Recent Labs Lab 03/01/17 2051  INR 1.02   Cardiac Enzymes:  Recent Labs Lab 03/02/17 0548  TROPONINI 0.03*   BNP (last 3 results) No results for input(s): PROBNP in the last 8760 hours. HbA1C:  Recent Labs  03/02/17 0548  HGBA1C 7.2*   CBG:  Recent Labs Lab 03/01/17 1932 03/02/17 0353 03/02/17 0749 03/02/17 1217  GLUCAP 71  163* 158* 158*   Lipid Profile:  Recent Labs  03/02/17 0548  CHOL 195  HDL 38*  LDLCALC 127*  TRIG 151*  CHOLHDL 5.1   Thyroid Function Tests: No results for input(s): TSH, T4TOTAL, FREET4, T3FREE, THYROIDAB in the last 72 hours. Anemia Panel: No results for input(s): VITAMINB12, FOLATE, FERRITIN, TIBC, IRON, RETICCTPCT in the last 72 hours. Sepsis Labs: No results for input(s): PROCALCITON, LATICACIDVEN in the last 168 hours.  No results found for this or any previous visit (from the past 240 hour(s)).       Radiology Studies: Dg Chest 2 View  Result Date: 03/01/2017 CLINICAL DATA:  Weakness EXAM: CHEST  2 VIEW COMPARISON:  None. FINDINGS: The mediastinal contour is normal. The heart size is enlarged. Right central venous line is identified distal tip in the superior vena cava. The lung volumes are low. There is no focal infiltrate, pulmonary edema, or pleural effusion. No acute abnormality is identified within the visualized bones. IMPRESSION: Cardiomegaly.  Low lung volumes.  The lungs are otherwise clear. Electronically Signed   By: Abelardo Diesel M.D.   On: 03/01/2017 18:56   Ct Head Wo Contrast  Result Date: 03/01/2017 CLINICAL DATA:  Slurred speech pain inability to move the left arm,onset at 1632 hours now resolved. Patient was leaning to the left. EXAM: CT HEAD WITHOUT CONTRAST TECHNIQUE: Contiguous axial images were obtained from the base of the skull through the vertex without intravenous contrast. COMPARISON:  None. FINDINGS: Brain: No evidence of acute large vessel territory infarction, hemorrhage, hydrocephalus, extra-axial collection or mass lesion/mass effect. Age-indeterminate but likely chronic minimal small vessel ischemic disease of deep white matter. Vascular: Mild-to-moderate atherosclerosis of the carotid siphons. No hyperdense appearing vessels. Skull: Hyperostosis frontalis interna of the skull. No acute fracture bone destruction. Sinuses/Orbits: No acute  finding. Other: Small left mastoid effusion. IMPRESSION: 1. No acute intracranial abnormality. 2. Minimally age-indeterminate small vessel ischemic changes periventricular white matter in the absence of prior studies for comparison. However given the moderate degree of atherosclerotic calcifications at the skullbase, findings are more likely to be chronic. 3. Small left mastoid effusion. Electronically Signed   By: Ashley Royalty M.D.   On: 03/01/2017 19:24   Mr Brain Wo Contrast  Result Date: 03/02/2017 CLINICAL DATA:  76 y/o F; syncope with possible left-sided weakness. EXAM: MRI HEAD WITHOUT CONTRAST MRA HEAD WITHOUT CONTRAST TECHNIQUE: Multiplanar, multiecho pulse sequences of the brain and surrounding structures  were obtained without intravenous contrast. Angiographic images of the head were obtained using MRA technique without contrast. COMPARISON:  03/01/2017 CT head FINDINGS: MRI HEAD FINDINGS Brain: No acute infarction, hemorrhage, hydrocephalus, extra-axial collection or mass lesion. Mild chronic microvascular ischemic changes and mild brain parenchymal volume loss for age. Vascular: As below. Skull and upper cervical spine: Normal marrow signal. Sinuses/Orbits: Mild diffuse paranasal sinus mucosal thickening. Small left mastoid effusion. Left intra-ocular lens replacement. Other: None. MRA HEAD FINDINGS Internal carotid arteries:  Patent. Anterior cerebral arteries:  Patent. Middle cerebral arteries: Patent. Anterior communicating artery: Patent and fenestrated. Posterior communicating arteries:  Patent. Posterior cerebral arteries:  Patent. Basilar artery:  Patent. Vertebral arteries:  Patent. No evidence of high-grade stenosis, large vessel occlusion, or aneurysm unless noted above. IMPRESSION: 1. No acute intracranial abnormality identified. 2. Mild chronic microvascular ischemic changes and parenchymal volume loss of the brain for age. 3. Mild paranasal sinus disease and small left mastoid effusion.  4. Patent circle of Willis. No large vessel occlusion, aneurysm, or significant stenosis is identified. Electronically Signed   By: Kristine Garbe M.D.   On: 03/02/2017 06:43   Mr Jodene Nam Head Wo Contrast  Result Date: 03/02/2017 CLINICAL DATA:  76 y/o F; syncope with possible left-sided weakness. EXAM: MRI HEAD WITHOUT CONTRAST MRA HEAD WITHOUT CONTRAST TECHNIQUE: Multiplanar, multiecho pulse sequences of the brain and surrounding structures were obtained without intravenous contrast. Angiographic images of the head were obtained using MRA technique without contrast. COMPARISON:  03/01/2017 CT head FINDINGS: MRI HEAD FINDINGS Brain: No acute infarction, hemorrhage, hydrocephalus, extra-axial collection or mass lesion. Mild chronic microvascular ischemic changes and mild brain parenchymal volume loss for age. Vascular: As below. Skull and upper cervical spine: Normal marrow signal. Sinuses/Orbits: Mild diffuse paranasal sinus mucosal thickening. Small left mastoid effusion. Left intra-ocular lens replacement. Other: None. MRA HEAD FINDINGS Internal carotid arteries:  Patent. Anterior cerebral arteries:  Patent. Middle cerebral arteries: Patent. Anterior communicating artery: Patent and fenestrated. Posterior communicating arteries:  Patent. Posterior cerebral arteries:  Patent. Basilar artery:  Patent. Vertebral arteries:  Patent. No evidence of high-grade stenosis, large vessel occlusion, or aneurysm unless noted above. IMPRESSION: 1. No acute intracranial abnormality identified. 2. Mild chronic microvascular ischemic changes and parenchymal volume loss of the brain for age. 3. Mild paranasal sinus disease and small left mastoid effusion. 4. Patent circle of Willis. No large vessel occlusion, aneurysm, or significant stenosis is identified. Electronically Signed   By: Kristine Garbe M.D.   On: 03/02/2017 06:43        Scheduled Meds: .  stroke: mapping our early stages of recovery book    Does not apply Once  . aspirin  300 mg Rectal Daily   Or  . aspirin  325 mg Oral Daily  . calcitRIOL  0.5 mcg Oral Daily  . famotidine  10 mg Oral Daily  . gabapentin  300 mg Oral TID  . insulin aspart  0-9 Units Subcutaneous Q4H  . nystatin   Topical TID  . pantoprazole  40 mg Oral Daily  . perflutren lipid microspheres (DEFINITY) IV suspension      . simvastatin  20 mg Oral Daily  . sodium chloride flush  10-40 mL Intracatheter Q12H   Continuous Infusions:   LOS: 0 days    Time spent: 35 minutes.     Hosie Poisson, MD [facility] Pager [phone] If 7PM-7AM, please contact night-coverage [email or web address] Password [facility] 03/02/2017, 3:27 PM

## 2017-03-02 NOTE — Progress Notes (Signed)
Pt arrived to [place]. Alert, oriented to self only. VS stable, no signs of acute distress. Pt denied pain, SOB.  Pt identified appropriately, tele box placed and central telemetry notified.  Pt oriented to room and equipment, instructed to call for assistance and call bell left within reach. Pt also placed on low bed and bed alarm activated. Will continue to monitor and treat pt per MD orders.

## 2017-03-02 NOTE — Progress Notes (Signed)
OT Cancellation Note  Patient Details Name: Ashlee Miles MRN: [id number] DOB: 1940/08/03   Cancelled Treatment:    Reason Eval/Treat Not Completed: Patient at procedure or test/ unavailable. Will reattempt OT eval as able.   Tyrone Schimke OTR/L Pager: [phone]   03/02/2017, 12:17 PM

## 2017-03-03 ENCOUNTER — Observation Stay (HOSPITAL_BASED_OUTPATIENT_CLINIC_OR_DEPARTMENT_OTHER): Payer: Medicare Other

## 2017-03-03 ENCOUNTER — Encounter (HOSPITAL_COMMUNITY): Payer: Medicare Other

## 2017-03-03 DIAGNOSIS — I5022 Chronic systolic (congestive) heart failure: Secondary | ICD-10-CM

## 2017-03-03 DIAGNOSIS — N183 Chronic kidney disease, stage 3 (moderate): Secondary | ICD-10-CM | POA: Diagnosis not present

## 2017-03-03 DIAGNOSIS — G459 Transient cerebral ischemic attack, unspecified: Secondary | ICD-10-CM | POA: Diagnosis not present

## 2017-03-03 DIAGNOSIS — Z794 Long term (current) use of insulin: Secondary | ICD-10-CM

## 2017-03-03 DIAGNOSIS — I951 Orthostatic hypotension: Secondary | ICD-10-CM | POA: Diagnosis not present

## 2017-03-03 DIAGNOSIS — I10 Essential (primary) hypertension: Secondary | ICD-10-CM

## 2017-03-03 DIAGNOSIS — R5381 Other malaise: Secondary | ICD-10-CM

## 2017-03-03 DIAGNOSIS — E1122 Type 2 diabetes mellitus with diabetic chronic kidney disease: Secondary | ICD-10-CM

## 2017-03-03 LAB — BASIC METABOLIC PANEL
Anion gap: 6 (ref 5–15)
BUN: 22 mg/dL — ABNORMAL HIGH (ref 6–20)
CO2: 21 mmol/L — ABNORMAL LOW (ref 22–32)
Calcium: 8.2 mg/dL — ABNORMAL LOW (ref 8.9–10.3)
Chloride: 109 mmol/L (ref 101–111)
Creatinine, Ser: 3.65 mg/dL — ABNORMAL HIGH (ref 0.44–1.00)
GFR calc Af Amer: 13 mL/min — ABNORMAL LOW (ref >60)
GFR calc non Af Amer: 11 mL/min — ABNORMAL LOW (ref >60)
Glucose, Bld: 292 mg/dL — ABNORMAL HIGH (ref 65–99)
Potassium: 4.5 mmol/L (ref 3.5–5.1)
Sodium: 136 mmol/L (ref 135–145)

## 2017-03-03 LAB — GLUCOSE, CAPILLARY
Glucose-Capillary: 102 mg/dL — ABNORMAL HIGH (ref 65–99)
Glucose-Capillary: 132 mg/dL — ABNORMAL HIGH (ref 65–99)
Glucose-Capillary: 215 mg/dL — ABNORMAL HIGH (ref 65–99)
Glucose-Capillary: 247 mg/dL — ABNORMAL HIGH (ref 65–99)
Glucose-Capillary: 275 mg/dL — ABNORMAL HIGH (ref 65–99)
Glucose-Capillary: 300 mg/dL — ABNORMAL HIGH (ref 65–99)

## 2017-03-03 LAB — URINE CULTURE

## 2017-03-03 LAB — TROPONIN I: Troponin I: <0.03 ng/mL (ref <0.03)

## 2017-03-03 MED ORDER — GLUCERNA SHAKE PO LIQD
237.0000 mL | Freq: Three times a day (TID) | ORAL | Status: DC
  Filled 2017-03-03: qty 237

## 2017-03-03 MED ORDER — SIMVASTATIN 40 MG PO TABS
40.0000 mg | ORAL_TABLET | Freq: Every day | ORAL | Status: DC
  Administered 2017-03-03 – 2017-03-04 (×2): 40 mg via ORAL
  Filled 2017-03-03 (×2): qty 1

## 2017-03-03 MED ORDER — INSULIN GLARGINE 100 UNIT/ML ~~LOC~~ SOLN
8.0000 [IU] | Freq: Every day | SUBCUTANEOUS | Status: DC
  Administered 2017-03-03: 8 [IU] via SUBCUTANEOUS
  Filled 2017-03-03 (×2): qty 0.08
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
## 2017-03-03 NOTE — Progress Notes (Signed)
Chaplain came to patient visit by way of request for prayer. Chaplain shared scripture, prayers and the ministry of presence and encouragement. The patient expressed the significance of Faith in her life and appeared to indeed by comforted by the time spent.

## 2017-03-03 NOTE — Progress Notes (Signed)
VASCULAR LAB PRELIMINARY  PRELIMINARY  PRELIMINARY  PRELIMINARY  Carotid duplex completed.    Preliminary report:  Technically difficult due to extremely high bifurcations and respiratory interference. No obvious evidence of significant ICA stenosis. Unable ot insonate the vertebral artery bilaterally  due to respiratory interference  Osmany Azer, RVS 03/03/2017, 5:32 PM

## 2017-03-03 NOTE — Progress Notes (Signed)
PROGRESS NOTE    Ashlee Miles  WYO:[id number] DOB: 1941-06-18 DOA: 03/01/2017 PCP: Darlina Rumpf, MD    Brief Narrative:  Ashlee Miles is a 76 y.o. female with medical history significant of CHF, DM 2, CKD, HTN, hx of pituitary adenoma presented with sudden onset of leaning to left   left arm weakness garbled speech started at 4:30 PM on 9/12,  witnessed by her family.   Assessment & Plan:   Active Problems:   TIA (transient ischemic attack)   Syncope   CKD (chronic kidney disease), stage III   DM type 2 causing CKD stage 3 (HCC)   Essential hypertension    Transient left arm weakness and slurred speech:  Resolved on on my exam .  Probably TIA vs deconditioning  Neuro work up with negative MRI brain.  Neurology on board and recommendations given.  Therapy evals done and recommended home health PT. ECHO shows mild LVH, good LVEF, no wall motion abn, and grade 1 diastolic dysfunction.  On aspirin 325 mg  Lipid panel,  Resume zocor.  Carotid duplex pending.   Stage 3 CKD: stable.   Hypertension: well controlled.    Diabetes mellitus: CBG (last 3)   Recent Labs  03/03/17 0529 03/03/17 0735 03/03/17 1241  GLUCAP 132* 102* 275*    Resume SSI. On 8 units of lantus at bedtime.    Back pain;  Resume norco.   Orthostatic hypotension:  Stop bp meds. TED hoses.  Repeat orthostatics in am.      DVT prophylaxis: scd's Code Status: DNR Family Communication: NONE AT BEDSIDE.  Disposition Plan: pending further evaluation. Possibly home in am.   Consultants:   Neurology.   Procedures:ECHO   Antimicrobials: NONE.   Subjective: Reports neck pain, back pain, arm pain. Dizzy on working with PT.   Objective: Vitals:   03/02/17 2031 03/03/17 0000 03/03/17 0500 03/03/17 0817  BP: (!) 159/60 124/66 115/61 (!) 167/69  Pulse: 69 67 64 71  Resp: 18 16 16    Temp: 97.9 F (36.6 C) 98.7 F (37.1 C) 98.1 F (36.7 C) 97.7 F (36.5 C)  TempSrc: Oral Oral Oral  Oral  SpO2: 100% 98% 97% 99%  Weight:      Height:        Intake/Output Summary (Last 24 hours) at 03/03/17 1628 Last data filed at 03/03/17 1346  Gross per 24 hour  Intake               20 ml  Output              400 ml  Net             -380 ml   Filed Weights   03/01/17 1749 03/02/17 0700  Weight: 115.7 kg (255 lb) 103.8 kg (228 lb 12.8 oz)    Examination:  General exam: Appears calm and comfortable  Respiratory system: Clear to auscultation. Respiratory effort normal. No wheezing or rhonchi Cardiovascular system: S1 & S2 heard, RRR. Trace pedal edema. Gastrointestinal system: Abdomen is soft non tender non distended bowel sounds heard.  Central nervous system: Alert and oriented. No focal neurological deficits. Extremities: Symmetric 5 x 5 power.trace pedal edema.  Skin: No rashes, lesions or ulcers Psychiatry: Judgement and insight appear normal. Mood & affect appropriate.     Data Reviewed: I have personally reviewed following labs and imaging studies  CBC:  Recent Labs Lab 03/01/17 2051 03/01/17 2102  WBC 6.5  --   NEUTROABS 4.2  --  HGB 10.5* 11.6*  HCT 32.7* 34.0*  MCV 76.0*  --   PLT 186  --    Basic Metabolic Panel:  Recent Labs Lab 03/01/17 2051 03/01/17 2102  NA 140 144  K 4.1 4.2  CL 112* 112*  CO2 22  --   GLUCOSE 88 87  BUN 18 21*  CREATININE 3.15* 3.20*  CALCIUM 8.5*  --    GFR: Estimated Creatinine Clearance: 17.2 mL/min (A) (by C-G formula based on SCr of 3.2 mg/dL (H)). Liver Function Tests:  Recent Labs Lab 03/01/17 2051  AST 11*  ALT 7*  ALKPHOS 84  BILITOT 0.4  PROT 6.1*  ALBUMIN 2.8*   No results for input(s): LIPASE, AMYLASE in the last 168 hours. No results for input(s): AMMONIA in the last 168 hours. Coagulation Profile:  Recent Labs Lab 03/01/17 2051  INR 1.02   Cardiac Enzymes:  Recent Labs Lab 03/02/17 0548 03/02/17 1800 03/03/17 0424  TROPONINI 0.03* <0.03 <0.03   BNP (last 3 results) No  results for input(s): PROBNP in the last 8760 hours. HbA1C:  Recent Labs  03/02/17 0548  HGBA1C 7.2*   CBG:  Recent Labs Lab 03/02/17 2029 03/03/17 0009 03/03/17 0529 03/03/17 0735 03/03/17 1241  GLUCAP 186* 215* 132* 102* 275*   Lipid Profile:  Recent Labs  03/02/17 0548  CHOL 195  HDL 38*  LDLCALC 127*  TRIG 151*  CHOLHDL 5.1   Thyroid Function Tests: No results for input(s): TSH, T4TOTAL, FREET4, T3FREE, THYROIDAB in the last 72 hours. Anemia Panel: No results for input(s): VITAMINB12, FOLATE, FERRITIN, TIBC, IRON, RETICCTPCT in the last 72 hours. Sepsis Labs: No results for input(s): PROCALCITON, LATICACIDVEN in the last 168 hours.  Recent Results (from the past 240 hour(s))  Urine Culture     Status: Abnormal   Collection Time: 03/02/17  2:31 AM  Result Value Ref Range Status   Specimen Description URINE, CLEAN CATCH  Final   Special Requests NONE  Final   Culture MULTIPLE SPECIES PRESENT, SUGGEST RECOLLECTION (A)  Final   Report Status 03/03/2017 FINAL  Final         Radiology Studies: Dg Chest 2 View  Result Date: 03/01/2017 CLINICAL DATA:  Weakness EXAM: CHEST  2 VIEW COMPARISON:  None. FINDINGS: The mediastinal contour is normal. The heart size is enlarged. Right central venous line is identified distal tip in the superior vena cava. The lung volumes are low. There is no focal infiltrate, pulmonary edema, or pleural effusion. No acute abnormality is identified within the visualized bones. IMPRESSION: Cardiomegaly.  Low lung volumes.  The lungs are otherwise clear. Electronically Signed   By: Abelardo Diesel M.D.   On: 03/01/2017 18:56   Ct Head Wo Contrast  Result Date: 03/01/2017 CLINICAL DATA:  Slurred speech pain inability to move the left arm,onset at 1632 hours now resolved. Patient was leaning to the left. EXAM: CT HEAD WITHOUT CONTRAST TECHNIQUE: Contiguous axial images were obtained from the base of the skull through the vertex without  intravenous contrast. COMPARISON:  None. FINDINGS: Brain: No evidence of acute large vessel territory infarction, hemorrhage, hydrocephalus, extra-axial collection or mass lesion/mass effect. Age-indeterminate but likely chronic minimal small vessel ischemic disease of deep white matter. Vascular: Mild-to-moderate atherosclerosis of the carotid siphons. No hyperdense appearing vessels. Skull: Hyperostosis frontalis interna of the skull. No acute fracture bone destruction. Sinuses/Orbits: No acute finding. Other: Small left mastoid effusion. IMPRESSION: 1. No acute intracranial abnormality. 2. Minimally age-indeterminate small vessel ischemic changes periventricular white  matter in the absence of prior studies for comparison. However given the moderate degree of atherosclerotic calcifications at the skullbase, findings are more likely to be chronic. 3. Small left mastoid effusion. Electronically Signed   By: Ashley Royalty M.D.   On: 03/01/2017 19:24   Mr Brain Wo Contrast  Result Date: 03/02/2017 CLINICAL DATA:  76 y/o F; syncope with possible left-sided weakness. EXAM: MRI HEAD WITHOUT CONTRAST MRA HEAD WITHOUT CONTRAST TECHNIQUE: Multiplanar, multiecho pulse sequences of the brain and surrounding structures were obtained without intravenous contrast. Angiographic images of the head were obtained using MRA technique without contrast. COMPARISON:  03/01/2017 CT head FINDINGS: MRI HEAD FINDINGS Brain: No acute infarction, hemorrhage, hydrocephalus, extra-axial collection or mass lesion. Mild chronic microvascular ischemic changes and mild brain parenchymal volume loss for age. Vascular: As below. Skull and upper cervical spine: Normal marrow signal. Sinuses/Orbits: Mild diffuse paranasal sinus mucosal thickening. Small left mastoid effusion. Left intra-ocular lens replacement. Other: None. MRA HEAD FINDINGS Internal carotid arteries:  Patent. Anterior cerebral arteries:  Patent. Middle cerebral arteries: Patent.  Anterior communicating artery: Patent and fenestrated. Posterior communicating arteries:  Patent. Posterior cerebral arteries:  Patent. Basilar artery:  Patent. Vertebral arteries:  Patent. No evidence of high-grade stenosis, large vessel occlusion, or aneurysm unless noted above. IMPRESSION: 1. No acute intracranial abnormality identified. 2. Mild chronic microvascular ischemic changes and parenchymal volume loss of the brain for age. 3. Mild paranasal sinus disease and small left mastoid effusion. 4. Patent circle of Willis. No large vessel occlusion, aneurysm, or significant stenosis is identified. Electronically Signed   By: Kristine Garbe M.D.   On: 03/02/2017 06:43   Mr Jodene Nam Head Wo Contrast  Result Date: 03/02/2017 CLINICAL DATA:  76 y/o F; syncope with possible left-sided weakness. EXAM: MRI HEAD WITHOUT CONTRAST MRA HEAD WITHOUT CONTRAST TECHNIQUE: Multiplanar, multiecho pulse sequences of the brain and surrounding structures were obtained without intravenous contrast. Angiographic images of the head were obtained using MRA technique without contrast. COMPARISON:  03/01/2017 CT head FINDINGS: MRI HEAD FINDINGS Brain: No acute infarction, hemorrhage, hydrocephalus, extra-axial collection or mass lesion. Mild chronic microvascular ischemic changes and mild brain parenchymal volume loss for age. Vascular: As below. Skull and upper cervical spine: Normal marrow signal. Sinuses/Orbits: Mild diffuse paranasal sinus mucosal thickening. Small left mastoid effusion. Left intra-ocular lens replacement. Other: None. MRA HEAD FINDINGS Internal carotid arteries:  Patent. Anterior cerebral arteries:  Patent. Middle cerebral arteries: Patent. Anterior communicating artery: Patent and fenestrated. Posterior communicating arteries:  Patent. Posterior cerebral arteries:  Patent. Basilar artery:  Patent. Vertebral arteries:  Patent. No evidence of high-grade stenosis, large vessel occlusion, or aneurysm unless  noted above. IMPRESSION: 1. No acute intracranial abnormality identified. 2. Mild chronic microvascular ischemic changes and parenchymal volume loss of the brain for age. 3. Mild paranasal sinus disease and small left mastoid effusion. 4. Patent circle of Willis. No large vessel occlusion, aneurysm, or significant stenosis is identified. Electronically Signed   By: Kristine Garbe M.D.   On: 03/02/2017 06:43        Scheduled Meds: .  stroke: mapping our early stages of recovery book   Does not apply Once  . aspirin  300 mg Rectal Daily   Or  . aspirin  325 mg Oral Daily  . calcitRIOL  0.5 mcg Oral Daily  . famotidine  10 mg Oral Daily  . feeding supplement (GLUCERNA SHAKE)  237 mL Oral TID BM  . gabapentin  300 mg Oral TID  . insulin  aspart  0-9 Units Subcutaneous Q4H  . insulin glargine  8 Units Subcutaneous QHS  . nystatin   Topical TID  . pantoprazole  40 mg Oral Daily  . simvastatin  40 mg Oral Daily  . sodium chloride flush  10-40 mL Intracatheter Q12H   Continuous Infusions:   LOS: 0 days    Time spent: 35 minutes.     Hosie Poisson, MD [facility] Pager [phone] If 7PM-7AM, please contact night-coverage [email or web address] Password [facility] 03/03/2017, 4:28 PM

## 2017-03-03 NOTE — Progress Notes (Signed)
Inpatient Diabetes Program Recommendations  AACE/ADA: New Consensus Statement on Inpatient Glycemic Control (2015)  Target Ranges:  Prepandial:   less than 140 mg/dL      Peak postprandial:   less than 180 mg/dL (1-2 hours)      Critically ill patients:  140 - 180 mg/dL   Lab Results  Component Value Date   GLUCAP 275 (H) 03/03/2017   HGBA1C 7.2 (H) 03/02/2017    Review of Glycemic ControlResults for Ashlee, Miles (MRN [id number]) as of 03/03/2017 14:33  Ref. Range 03/02/2017 20:29 03/03/2017 00:09 03/03/2017 05:29 03/03/2017 07:35 03/03/2017 12:41  Glucose-Capillary Latest Ref Range: 65 - 99 mg/dL 186 (H) 215 (H) 132 (H) 102 (H) 275 (H)   Diabetes history: Type 2 diabetes Outpatient Diabetes medications: Novolog 70/30 40 units q AM Current orders for Inpatient glycemic control:  Novolog sensitive q 4 hours  Inpatient Diabetes Program Recommendations:   Please change Novolog correction to sensitive tid with meals and HS since patient is eating.  If fasting CBG>180 mg/dL, consider adding Lantus 10 units daily.    Thanks, Adah Perl, RN, BC-ADM Inpatient Diabetes Coordinator Pager [phone] (8a-5p)

## 2017-03-03 NOTE — Progress Notes (Signed)
Nutrition Brief Note  Patient identified on the Malnutrition Screening Tool (MST) Report  Wt Readings from Last 15 Encounters:  03/02/17 228 lb 12.8 oz (103.8 kg)   Navpreet Szczygiel is an 76 y.o. female African-American with PMH of morbid obesity, CHF, DM 2, CTD, hypertension, pituitary adenoma who is brought to the hospital for possible left-sided weakness.  Body mass index is 41.85 kg/m. Patient meets criteria for morbid obesity based on current BMI.   Current diet order is Heart Healthy/ Carb Modified, patient is consuming approximately n/a% of meals at this time. Labs and medications reviewed.   No nutrition interventions warranted at this time. If nutrition issues arise, please consult RD.   Shaquanta Harkless A. Jimmye Norman, RD, LDN, CDE Pager: [phone] After hours Pager: [phone]

## 2017-03-03 NOTE — Progress Notes (Signed)
Physical Therapy Treatment Patient Details Name: Ashlee Miles MRN: [id number] DOB: 09/06/1940 Today's Date: 03/03/2017    History of Present Illness Ashlee Miles a 76 y.o.femalewith medical history significant of CHF, DM 2, CKD, HTN, pituitary adenoma, admitted with sudden onset of leaning to the left, Left arm weakness, qne garbled speech.  MRI negative for acute abnormality.    PT Comments    Pt progressing well. Encouragement provided for increased mobility. Pt ambulated to bathroom then agreeable to sitting in recliner for lunch. She fatigues quickly.    Follow Up Recommendations  Home health PT;Supervision/Assistance - 24 hour     Equipment Recommendations  None recommended by PT    Recommendations for Other Services       Precautions / Restrictions Precautions Precautions: Fall    Mobility  Bed Mobility         Supine to sit: Min assist     General bed mobility comments: increased time to complete, +rail  Transfers   Equipment used: Rolling walker (2 wheeled)   Sit to Stand: Min guard         General transfer comment: increased time to complete, verbal cues for hand placement, no physical assist to power up  Ambulation/Gait Ambulation/Gait assistance: Min guard Ambulation Distance (Feet): 20 Feet (x 2) Assistive device: Rolling walker (2 wheeled) Gait Pattern/deviations: Step-through pattern;Decreased stride length;Wide base of support Gait velocity: decreased Gait velocity interpretation: Below normal speed for age/gender General Gait Details: slow, short steps. Pt fatigues quickly. Min guard assist for safety.   Stairs            Wheelchair Mobility    Modified Rankin (Stroke Patients Only)       Balance     Sitting balance-[name]: Good       Standing balance-[name]: Poor Standing balance comment: heavy reliance on RW                            Cognition Arousal/Alertness: Awake/alert Behavior  During Therapy: WFL for tasks assessed/performed Overall Cognitive Status: Within Functional Limits for tasks assessed                                        Exercises      General Comments        Pertinent Vitals/Pain Pain Assessment: Faces Faces Pain Scale: Hurts a little bit Pain Location: generalized Pain Descriptors / Indicators: Aching;Grimacing Pain Intervention(s): Monitored during session;Repositioned    Home Living                      Prior Function            PT Goals (current goals can now be found in the care plan section) Acute Rehab PT Goals Patient Stated Goal: go home PT Goal Formulation: With patient Time For Goal Achievement: 03/09/17 Potential to Achieve Goals: Good Progress towards PT goals: Progressing toward goals    Frequency    Min 3X/week      PT Plan Current plan remains appropriate    Co-evaluation              AM-PAC PT "6 Clicks" Daily Activity  Outcome Measure  Difficulty turning over in bed (including adjusting bedclothes, sheets and blankets)?: A Lot Difficulty moving from lying on back to sitting on the side of the  bed? : A Lot Difficulty sitting down on and standing up from a chair with arms (e.g., wheelchair, bedside commode, etc,.)?: A Little Help needed moving to and from a bed to chair (including a wheelchair)?: A Little Help needed walking in hospital room?: A Little Help needed climbing 3-5 steps with a railing? : A Little 6 Click Score: 16    End of Session Equipment Utilized During Treatment: Gait belt Activity Tolerance: Patient tolerated treatment well Patient left: in chair;with call bell/phone within reach Nurse Communication: Mobility status PT Visit Diagnosis: Muscle weakness (generalized) (M62.81);Other abnormalities of gait and mobility (R26.89);Pain     Time: 3903-0092 PT Time Calculation (min) (ACUTE ONLY): 26 min  Charges:  $Gait Training: 23-37 mins                     G Codes:  Functional Assessment Tool Used: AM-PAC 6 Clicks Basic Mobility Functional Limitation: Mobility: Walking and moving around Mobility: Walking and Moving Around Current Status (Z3007): At least 40 percent but less than 60 percent impaired, limited or restricted Mobility: Walking and Moving Around Goal Status [phone]): At least 1 percent but less than 20 percent impaired, limited or restricted    Lorrin Goodell, PT  Office # [phone] Pager [phone]    [name] 03/03/2017, 1:42 PM

## 2017-03-04 DIAGNOSIS — E1122 Type 2 diabetes mellitus with diabetic chronic kidney disease: Secondary | ICD-10-CM | POA: Diagnosis not present

## 2017-03-04 DIAGNOSIS — Z794 Long term (current) use of insulin: Secondary | ICD-10-CM | POA: Diagnosis not present

## 2017-03-04 DIAGNOSIS — I10 Essential (primary) hypertension: Secondary | ICD-10-CM | POA: Diagnosis not present

## 2017-03-04 DIAGNOSIS — N183 Chronic kidney disease, stage 3 (moderate): Secondary | ICD-10-CM | POA: Diagnosis not present

## 2017-03-04 DIAGNOSIS — G459 Transient cerebral ischemic attack, unspecified: Secondary | ICD-10-CM | POA: Diagnosis not present

## 2017-03-04 LAB — GLUCOSE, CAPILLARY
Glucose-Capillary: 129 mg/dL — ABNORMAL HIGH (ref 65–99)
Glucose-Capillary: 147 mg/dL — ABNORMAL HIGH (ref 65–99)
Glucose-Capillary: 178 mg/dL — ABNORMAL HIGH (ref 65–99)
Glucose-Capillary: 205 mg/dL — ABNORMAL HIGH (ref 65–99)
Glucose-Capillary: 209 mg/dL — ABNORMAL HIGH (ref 65–99)

## 2017-03-04 LAB — VAS US CAROTID
Left CCA dist dias: -10 cm/s
Left CCA dist sys: -51 cm/s
Left CCA prox dias: 9 cm/s
Left CCA prox sys: 56 cm/s
Left ICA dist dias: -17 cm/s
Left ICA dist sys: -77 cm/s
Left ICA prox dias: 11 cm/s
Left ICA prox sys: 46 cm/s
RIGHT ECA DIAS: -9 cm/s
Right CCA prox dias: 14 cm/s
Right CCA prox sys: 76 cm/s
Right cca dist sys: -96 cm/s

## 2017-03-04 MED ORDER — HEPARIN SOD (PORK) LOCK FLUSH 100 UNIT/ML IV SOLN
500.0000 [IU] | INTRAVENOUS | Status: DC
  Filled 2017-03-04: qty 5

## 2017-03-04 MED ORDER — SIMVASTATIN 40 MG PO TABS: 40.0000 mg | ORAL_TABLET | Freq: Every day | ORAL | 0 refills | Status: DC

## 2017-03-04 MED ORDER — ASPIRIN 325 MG PO TABS
325.0000 mg | ORAL_TABLET | Freq: Every day | ORAL | 0 refills | Status: DC
Start: 2017-03-05 — End: 2017-04-12

## 2017-03-04 MED ORDER — HEPARIN SOD (PORK) LOCK FLUSH 100 UNIT/ML IV SOLN
500.0000 [IU] | INTRAVENOUS | Status: DC | PRN
  Administered 2017-03-04: 500 [IU]
  Filled 2017-03-04 (×2): qty 5

## 2017-03-04 MED ORDER — CARVEDILOL 6.25 MG PO TABS: 6.2500 mg | ORAL_TABLET | Freq: Two times a day (BID) | ORAL | 0 refills | Status: DC

## 2017-03-04 NOTE — Care Management Obs Status (Signed)
[place] NOTIFICATION   Patient Details  Name: Ashlee Miles MRN: [id number] Date of Birth: 06-Apr-1941   Medicare Observation Status Notification Given:  Yes    Carles Collet, RN 03/04/2017, 3:29 PM

## 2017-03-04 NOTE — Care Management Note (Signed)
Case Management Note  Patient Details  Name: Ashlee Miles MRN: [id number] Date of Birth: 1941/05/08  Subjective/Objective:                 Spoke with patient and daughter. Patient will DC to her daughter's house at [address] [place]. Spoke w [place] who will provide a taxi voucher on front of chart for patient, please provide patient with hospital gown x2 to wear home (as gown and robe) as her daughter has her clothes. Referral placed to [facility] for [facility] PT no DME needed. Bedside RN Lonnie updated.    Action/Plan:   Expected Discharge Date:                  Expected Discharge Plan:  [place]  In-House Referral:  Clinical Social Work  Discharge planning Services  CM Consult  Post Acute Care Choice:  Home Health Choice offered to:  Patient  DME Arranged:    DME Agency:     HH Arranged:  PT [place]:  [place]  Status of Service:  Completed, signed off  If discussed at [place] of Stay Meetings, dates discussed:    Additional Comments:  Carles Collet, RN 03/04/2017, 3:43 PM

## 2017-03-04 NOTE — NC FL2 (Signed)
[place] LEVEL OF CARE SCREENING TOOL     IDENTIFICATION  Patient Name: Ashlee Miles Birthdate: 05/11/1941 Sex: female Admission Date (Current Location): 03/01/2017  [facility] and [place] Number:  Herbalist and Address:  [facility] [place], [place] [address], [place], [place] [address]      Provider Number: [id number]  Attending Physician Name and Address:  Hosie Poisson, MD  Relative Name and Phone Number:       Current Level of Care: Hospital Recommended Level of Care: [place] Prior Approval Number:    Date Approved/Denied:   PASRR Number: [id number]  Discharge Plan: SNF    Current Diagnoses: Patient Active Problem List   Diagnosis Date Noted  . TIA (transient ischemic attack) 03/01/2017  . Syncope 03/01/2017  . CKD (chronic kidney disease), stage III 03/01/2017  . DM type 2 causing CKD stage 3 ([place]) 03/01/2017  . Essential hypertension 03/01/2017  . Chronic systolic CHF (congestive heart failure), NYHA class 2 ([place]) 03/01/2017    Orientation RESPIRATION BLADDER Height & Weight     Self, Time, Situation, Place  Normal Continent Weight: 103.8 kg (228 lb 12.8 oz) Height:  5\' 2"  (157.5 cm)  BEHAVIORAL SYMPTOMS/MOOD NEUROLOGICAL BOWEL NUTRITION STATUS      Continent Diet (Please see DC Summary)  AMBULATORY STATUS COMMUNICATION OF NEEDS Skin   Limited Assist Verbally Normal                       Personal Care Assistance Level of Assistance  Bathing, Feeding, Dressing Bathing Assistance: Limited assistance Feeding assistance: Independent Dressing Assistance: Limited assistance     Functional Limitations Info  Hearing, Sight Sight Info: Impaired Hearing Info: Impaired      SPECIAL CARE FACTORS FREQUENCY  PT (By licensed PT)     PT Frequency: 5x/week              Contractures      Additional Factors Info  Code Status, Allergies, Insulin Sliding Scale Code Status Info: DNR Allergies  Info: NKA   Insulin Sliding Scale Info: Every 4 hours and at bedtime       Current Medications (03/04/2017):  This is the current hospital active medication list Current Facility-Administered Medications  Medication Dose Route Frequency Provider Last Rate Last Dose  .  stroke: mapping our early stages of recovery book   Does not apply Once Toy Baker, MD      . acetaminophen (TYLENOL) tablet 650 mg  650 mg Oral Q4H PRN Toy Baker, MD       Or  . acetaminophen (TYLENOL) solution 650 mg  650 mg Per Tube Q4H PRN Doutova, Anastassia, MD       Or  . acetaminophen (TYLENOL) suppository 650 mg  650 mg Rectal Q4H PRN Doutova, Anastassia, MD      . aspirin suppository 300 mg  300 mg Rectal Daily Doutova, Anastassia, MD       Or  . aspirin tablet 325 mg  325 mg Oral Daily Doutova, Anastassia, MD   325 mg at 03/04/17 1011  . calcitRIOL (ROCALTROL) capsule 0.5 mcg  0.5 mcg Oral Daily Doutova, Anastassia, MD   0.5 mcg at 03/04/17 1011  . cyclobenzaprine (FLEXERIL) tablet 10 mg  10 mg Oral PRN Toy Baker, MD   10 mg at 03/03/17 1002  . diphenhydrAMINE (BENADRYL) injection 12.5 mg  12.5 mg Intravenous Q8H PRN Hosie Poisson, MD      . famotidine (  PEPCID) tablet 10 mg  10 mg Oral Daily Doutova, Anastassia, MD   10 mg at 03/04/17 1011  . gabapentin (NEURONTIN) capsule 300 mg  300 mg Oral TID Toy Baker, MD   300 mg at 03/04/17 1011  . HYDROcodone-acetaminophen (NORCO) 7.5-325 MG per tablet 1 tablet  1 tablet Oral Q6H PRN Toy Baker, MD   1 tablet at 03/04/17 1319  . insulin aspart (novoLOG) injection 0-9 Units  0-9 Units Subcutaneous Q4H Toy Baker, MD   3 Units at 03/04/17 1320  . insulin glargine (LANTUS) injection 8 Units  8 Units Subcutaneous QHS Hosie Poisson, MD   8 Units at 03/03/17 2136  . nystatin (MYCOSTATIN/NYSTOP) topical powder   Topical TID Toy Baker, MD      . pantoprazole (PROTONIX) EC tablet 40 mg  40 mg Oral Daily Doutova,  Anastassia, MD   40 mg at 03/04/17 1011  . senna-docusate (Senokot-S) tablet 1 tablet  1 tablet Oral QHS PRN Doutova, Anastassia, MD      . simvastatin (ZOCOR) tablet 40 mg  40 mg Oral Daily Rosalin Hawking, MD   40 mg at 03/04/17 1011  . sodium chloride flush (NS) 0.9 % injection 10-40 mL  10-40 mL Intracatheter Q12H Doutova, Anastassia, MD   10 mL at 03/02/17 2200  . sodium chloride flush (NS) 0.9 % injection 10-40 mL  10-40 mL Intracatheter PRN Doutova, Anastassia, MD   15 mL at 03/02/17 1254  . sodium chloride flush (NS) 0.9 % injection 10-40 mL  10-40 mL Intracatheter PRN Hosie Poisson, MD   10 mL at 03/03/17 0428     Discharge Medications: Please see discharge summary for a list of discharge medications.  Relevant Imaging Results:  Relevant Lab Results:   Additional Information SSN: [id number]  Benard Halsted, LCSWA

## 2017-03-04 NOTE — Progress Notes (Signed)
RNCM alerted CSW that patient is returning home. CSW provided taxi voucher.  Ashlee Miles LCSWA [phone]

## 2017-03-06 NOTE — Discharge Summary (Signed)
Physician Discharge Summary  Ashlee Miles DJM:[id number] DOB: 02/17/1941 DOA: 03/01/2017  PCP: Ashlee Rumpf, MD  Admit date: 03/01/2017 Discharge date: 03/04/2017  Admitted From: Home.  Disposition:  HOme.   Recommendations for Outpatient Follow-up:  1. Follow up with PCP in 1-2 weeks 2. Please obtain BMP/CBC in one week 3. Please follow up cardiology in 2 to 4 weeks as we have changed a lot of medications.   Home Health:yes   Discharge Condition:stable.  CODE STATUS:full code.  Diet recommendation: Heart Healthy  Brief/Interim Summary: Ashlee Miles a 76 y.o.femalewith medical history significant of CHF, DM 2, CKD, HTN, hx of pituitary adenoma presented with sudden onset of leaning to left left arm weaknessgarbled speech started at 4:30 PM on 9/12, witnessed by herfamily.   Discharge Diagnoses:  Active Problems:   TIA (transient ischemic attack)   Syncope   CKD (chronic kidney disease), stage III   DM type 2 causing CKD stage 3 (HCC)   Essential hypertension   Chronic systolic CHF (congestive heart failure), NYHA class 2 (HCC)  Transient left arm weakness and slurred speech:  Resolved on on my exam .  Probably TIA vs deconditioning  Neuro work up with negative MRI brain.  Neurology on board and recommendations given.  Therapy evals done and recommended home health PT. ECHO shows mild LVH, good LVEF, no wall motion abn, and grade 1 diastolic dysfunction.  On aspirin 325 mg  Lipid panel reviewed and increased the dose of zocor.  Carotid duplex negative for sig stenosis. .   Stage 3 CKD: stable.   Hypertension: well controlled.    Diabetes mellitus: CBG (last 3)   Recent Labs (last 2 labs)    Recent Labs  03/03/17 0529 03/03/17 0735 03/03/17 1241  GLUCAP 132* 102* 275*      Resume home meds on discharge.    Back pain;  Resume norco.   Orthostatic hypotension:  TED hoses. Gentle hdyration, decreased the doses of the bp medications.   Stopped lasix, amlodipine and decreased coreg from 25 to 6.25 mg BID.  Repeat orthostatics in am negative. Marland Kitchen     Discharge Instructions  Discharge Instructions    Diet - low sodium heart healthy    Complete by:  As directed    Discharge instructions    Complete by:  As directed    Please follow up with PCP in one week. You had significant orthostatic hypotension on admission, we have stopped your lasix and amlodipine because of that and decreased coreg to 6.25 mg BID from 25 mg BID, recommend follow up with PCP in one week and recheck your orthostatic vital signs and discuss about restarting lasix at a lower dose in 1 to 2 weeks.     Allergies as of 03/04/2017   No Known Allergies     Medication List    STOP taking these medications   amLODipine 10 MG tablet Commonly known as:  NORVASC   furosemide 80 MG tablet Commonly known as:  LASIX   hydrochlorothiazide 50 MG tablet Commonly known as:  HYDRODIURIL   losartan 25 MG tablet Commonly known as:  COZAAR   potassium chloride 20 MEQ packet Commonly known as:  KLOR-CON     TAKE these medications   aspirin 325 MG tablet Take 1 tablet (325 mg total) by mouth daily.   calcitRIOL 0.5 MCG capsule Commonly known as:  ROCALTROL Take 0.5 mcg by mouth daily.   carvedilol 6.25 MG tablet Commonly known as:  COREG Take 1  tablet (6.25 mg total) by mouth 2 (two) times daily. What changed:  medication strength  how much to take  when to take this   cyclobenzaprine 10 MG tablet Commonly known as:  FLEXERIL Take 10 mg by mouth as needed for muscle spasms.   gabapentin 300 MG capsule Commonly known as:  NEURONTIN Take 300 mg by mouth 3 (three) times daily.   HYDROcodone-acetaminophen 7.5-325 MG tablet Commonly known as:  NORCO Take 1 tablet by mouth every 6 (six) hours as needed for moderate pain.   insulin aspart protamine- aspart (70-30) 100 UNIT/ML injection Commonly known as:  NOVOLOG MIX 70/30 Inject 40 Units  into the skin daily with breakfast.   linaclotide 145 MCG Caps capsule Commonly known as:  LINZESS Take 145 mcg by mouth daily before breakfast.   omeprazole 20 MG capsule Commonly known as:  PRILOSEC Take 20 mg by mouth daily.   ranitidine 150 MG tablet Commonly known as:  ZANTAC Take 150 mg by mouth 2 (two) times daily.   simvastatin 40 MG tablet Commonly known as:  ZOCOR Take 1 tablet (40 mg total) by mouth daily. What changed:  medication strength  how much to take   triamcinolone cream 0.1 % Commonly known as:  KENALOG Apply 1 application topically 2 (two) times daily. Use on her hand            Discharge Care Instructions        Start     Ordered   03/05/17 0000  aspirin 325 MG tablet  Daily     03/04/17 1346   03/05/17 0000  simvastatin (ZOCOR) 40 MG tablet  Daily     03/04/17 1347   03/04/17 0000  carvedilol (COREG) 6.25 MG tablet  2 times daily     03/04/17 1503   03/04/17 0000  Diet - low sodium heart healthy     03/04/17 1503   03/04/17 0000  Discharge instructions    Comments:  Please follow up with PCP in one week. You had significant orthostatic hypotension on admission, we have stopped your lasix and amlodipine because of that and decreased coreg to 6.25 mg BID from 25 mg BID, recommend follow up with PCP in one week and recheck your orthostatic vital signs and discuss about restarting lasix at a lower dose in 1 to 2 weeks.   03/04/17 1713     Follow-up Information    Ashlee Rumpf, MD. Schedule an appointment as soon as possible for a visit in 1 week(s).   Specialty:  Internal Medicine Contact information: [address] [place] [address] [phone]          No Known Allergies  Consultations:  Neurology.    Procedures/Studies: Dg Chest 2 View  Result Date: 03/01/2017 CLINICAL DATA:  Weakness EXAM: CHEST  2 VIEW COMPARISON:  None. FINDINGS: The mediastinal contour is normal. The heart size is enlarged. Right central  venous line is identified distal tip in the superior vena cava. The lung volumes are low. There is no focal infiltrate, pulmonary edema, or pleural effusion. No acute abnormality is identified within the visualized bones. IMPRESSION: Cardiomegaly.  Low lung volumes.  The lungs are otherwise clear. Electronically Signed   By: Abelardo Diesel M.D.   On: 03/01/2017 18:56   Ct Head Wo Contrast  Result Date: 03/01/2017 CLINICAL DATA:  Slurred speech pain inability to move the left arm,onset at 1632 hours now resolved. Patient was leaning to the left. EXAM: CT HEAD WITHOUT  CONTRAST TECHNIQUE: Contiguous axial images were obtained from the base of the skull through the vertex without intravenous contrast. COMPARISON:  None. FINDINGS: Brain: No evidence of acute large vessel territory infarction, hemorrhage, hydrocephalus, extra-axial collection or mass lesion/mass effect. Age-indeterminate but likely chronic minimal small vessel ischemic disease of deep white matter. Vascular: Mild-to-moderate atherosclerosis of the carotid siphons. No hyperdense appearing vessels. Skull: Hyperostosis frontalis interna of the skull. No acute fracture bone destruction. Sinuses/Orbits: No acute finding. Other: Small left mastoid effusion. IMPRESSION: 1. No acute intracranial abnormality. 2. Minimally age-indeterminate small vessel ischemic changes periventricular white matter in the absence of prior studies for comparison. However given the moderate degree of atherosclerotic calcifications at the skullbase, findings are more likely to be chronic. 3. Small left mastoid effusion. Electronically Signed   By: Ashley Royalty M.D.   On: 03/01/2017 19:24   Mr Brain Wo Contrast  Result Date: 03/02/2017 CLINICAL DATA:  76 y/o F; syncope with possible left-sided weakness. EXAM: MRI HEAD WITHOUT CONTRAST MRA HEAD WITHOUT CONTRAST TECHNIQUE: Multiplanar, multiecho pulse sequences of the brain and surrounding structures were obtained without  intravenous contrast. Angiographic images of the head were obtained using MRA technique without contrast. COMPARISON:  03/01/2017 CT head FINDINGS: MRI HEAD FINDINGS Brain: No acute infarction, hemorrhage, hydrocephalus, extra-axial collection or mass lesion. Mild chronic microvascular ischemic changes and mild brain parenchymal volume loss for age. Vascular: As below. Skull and upper cervical spine: Normal marrow signal. Sinuses/Orbits: Mild diffuse paranasal sinus mucosal thickening. Small left mastoid effusion. Left intra-ocular lens replacement. Other: None. MRA HEAD FINDINGS Internal carotid arteries:  Patent. Anterior cerebral arteries:  Patent. Middle cerebral arteries: Patent. Anterior communicating artery: Patent and fenestrated. Posterior communicating arteries:  Patent. Posterior cerebral arteries:  Patent. Basilar artery:  Patent. Vertebral arteries:  Patent. No evidence of high-grade stenosis, large vessel occlusion, or aneurysm unless noted above. IMPRESSION: 1. No acute intracranial abnormality identified. 2. Mild chronic microvascular ischemic changes and parenchymal volume loss of the brain for age. 3. Mild paranasal sinus disease and small left mastoid effusion. 4. Patent circle of Willis. No large vessel occlusion, aneurysm, or significant stenosis is identified. Electronically Signed   By: Kristine Garbe M.D.   On: 03/02/2017 06:43   Mr Jodene Nam Head Wo Contrast  Result Date: 03/02/2017 CLINICAL DATA:  76 y/o F; syncope with possible left-sided weakness. EXAM: MRI HEAD WITHOUT CONTRAST MRA HEAD WITHOUT CONTRAST TECHNIQUE: Multiplanar, multiecho pulse sequences of the brain and surrounding structures were obtained without intravenous contrast. Angiographic images of the head were obtained using MRA technique without contrast. COMPARISON:  03/01/2017 CT head FINDINGS: MRI HEAD FINDINGS Brain: No acute infarction, hemorrhage, hydrocephalus, extra-axial collection or mass lesion. Mild  chronic microvascular ischemic changes and mild brain parenchymal volume loss for age. Vascular: As below. Skull and upper cervical spine: Normal marrow signal. Sinuses/Orbits: Mild diffuse paranasal sinus mucosal thickening. Small left mastoid effusion. Left intra-ocular lens replacement. Other: None. MRA HEAD FINDINGS Internal carotid arteries:  Patent. Anterior cerebral arteries:  Patent. Middle cerebral arteries: Patent. Anterior communicating artery: Patent and fenestrated. Posterior communicating arteries:  Patent. Posterior cerebral arteries:  Patent. Basilar artery:  Patent. Vertebral arteries:  Patent. No evidence of high-grade stenosis, large vessel occlusion, or aneurysm unless noted above. IMPRESSION: 1. No acute intracranial abnormality identified. 2. Mild chronic microvascular ischemic changes and parenchymal volume loss of the brain for age. 3. Mild paranasal sinus disease and small left mastoid effusion. 4. Patent circle of Willis. No large vessel occlusion, aneurysm, or significant  stenosis is identified. Electronically Signed   By: Kristine Garbe M.D.   On: 03/02/2017 06:43    Echcoardiogram.  Carotid duplex.    Subjective: No new complaints.   Discharge Exam: Vitals:   03/04/17 1504 03/04/17 1505  BP: 93/71 (!) 115/54  Pulse: 78 85  Resp:    Temp:    SpO2: (!) 81%    Vitals:   03/04/17 1459 03/04/17 1501 03/04/17 1504 03/04/17 1505  BP: (!) 128/47 (!) 129/55 93/71 (!) 115/54  Pulse: 76 78 78 85  Resp: 16     Temp: 98.7 F (37.1 C)     TempSrc: Oral     SpO2: 100% 97% (!) 81%   Weight:      Height:        General: Pt is alert, awake, not in acute distress Cardiovascular: RRR, S1/S2 +, no rubs, no gallops Respiratory: CTA bilaterally, no wheezing, no rhonchi Abdominal: Soft, NT, ND, bowel sounds + Extremities: no edema, no cyanosis    The results of significant diagnostics from this hospitalization (including imaging, microbiology, ancillary and  laboratory) are listed below for reference.     Microbiology: Recent Results (from the past 240 hour(s))  Urine Culture     Status: Abnormal   Collection Time: 03/02/17  2:31 AM  Result Value Ref Range Status   Specimen Description URINE, CLEAN CATCH  Final   Special Requests NONE  Final   Culture MULTIPLE SPECIES PRESENT, SUGGEST RECOLLECTION (A)  Final   Report Status 03/03/2017 FINAL  Final     Labs: BNP (last 3 results) No results for input(s): BNP in the last 8760 hours. Basic Metabolic Panel:  Recent Labs Lab 03/01/17 2051 03/01/17 2102 03/03/17 1613  NA 140 144 136  K 4.1 4.2 4.5  CL 112* 112* 109  CO2 22  --  21*  GLUCOSE 88 87 292*  BUN 18 21* 22*  CREATININE 3.15* 3.20* 3.65*  CALCIUM 8.5*  --  8.2*   Liver Function Tests:  Recent Labs Lab 03/01/17 2051  AST 11*  ALT 7*  ALKPHOS 84  BILITOT 0.4  PROT 6.1*  ALBUMIN 2.8*   No results for input(s): LIPASE, AMYLASE in the last 168 hours. No results for input(s): AMMONIA in the last 168 hours. CBC:  Recent Labs Lab 03/01/17 2051 03/01/17 2102  WBC 6.5  --   NEUTROABS 4.2  --   HGB 10.5* 11.6*  HCT 32.7* 34.0*  MCV 76.0*  --   PLT 186  --    Cardiac Enzymes:  Recent Labs Lab 03/02/17 0548 03/02/17 1800 03/03/17 0424  TROPONINI 0.03* <0.03 <0.03   BNP: Invalid input(s): POCBNP CBG:  Recent Labs Lab 03/04/17 0003 03/04/17 0431 03/04/17 0754 03/04/17 1150 03/04/17 1725  GLUCAP 147* 129* 178* 209* 205*   D-Dimer No results for input(s): DDIMER in the last 72 hours. Hgb A1c No results for input(s): HGBA1C in the last 72 hours. Lipid Profile No results for input(s): CHOL, HDL, LDLCALC, TRIG, CHOLHDL, LDLDIRECT in the last 72 hours. Thyroid function studies No results for input(s): TSH, T4TOTAL, T3FREE, THYROIDAB in the last 72 hours.  Invalid input(s): FREET3 Anemia work up No results for input(s): VITAMINB12, FOLATE, FERRITIN, TIBC, IRON, RETICCTPCT in the last 72  hours. Urinalysis    Component Value Date/Time   COLORURINE STRAW (A) 03/01/2017 2100   APPEARANCEUR CLEAR 03/01/2017 2100   LABSPEC 1.007 03/01/2017 2100   PHURINE 6.0 03/01/2017 2100   GLUCOSEU 50 (A) 03/01/2017 2100  HGBUR SMALL (A) 03/01/2017 2100   BILIRUBINUR NEGATIVE 03/01/2017 2100   KETONESUR NEGATIVE 03/01/2017 2100   PROTEINUR >=300 (A) 03/01/2017 2100   NITRITE NEGATIVE 03/01/2017 2100   LEUKOCYTESUR TRACE (A) 03/01/2017 2100   Sepsis Labs Invalid input(s): PROCALCITONIN,  WBC,  LACTICIDVEN Microbiology Recent Results (from the past 240 hour(s))  Urine Culture     Status: Abnormal   Collection Time: 03/02/17  2:31 AM  Result Value Ref Range Status   Specimen Description URINE, CLEAN CATCH  Final   Special Requests NONE  Final   Culture MULTIPLE SPECIES PRESENT, SUGGEST RECOLLECTION (A)  Final   Report Status 03/03/2017 FINAL  Final     Time coordinating discharge: Over 30 minutes  SIGNED:   Hosie Poisson, MD  [facility] 03/06/2017, 7:36 AM Pager   If 7PM-7AM, please contact night-coverage [email or web address] Password [email or web address]

## 2017-03-12 ENCOUNTER — Emergency Department (HOSPITAL_COMMUNITY): Payer: 59

## 2017-03-12 ENCOUNTER — Inpatient Hospital Stay (HOSPITAL_COMMUNITY): Payer: 59

## 2017-03-12 ENCOUNTER — Encounter (HOSPITAL_COMMUNITY): Payer: Self-pay | Admitting: Emergency Medicine

## 2017-03-12 ENCOUNTER — Inpatient Hospital Stay (HOSPITAL_COMMUNITY)
Admission: EM | Admit: 2017-03-12 | Discharge: 2017-03-22 | DRG: 673 | Disposition: A | Discharge status: Home / Self Care | Payer: 59 | Attending: Internal Medicine | Admitting: Internal Medicine

## 2017-03-12 DIAGNOSIS — N186 End stage renal disease: Secondary | ICD-10-CM | POA: Diagnosis present

## 2017-03-12 DIAGNOSIS — G8929 Other chronic pain: Secondary | ICD-10-CM | POA: Diagnosis present

## 2017-03-12 DIAGNOSIS — G9341 Metabolic encephalopathy: Secondary | ICD-10-CM | POA: Diagnosis not present

## 2017-03-12 DIAGNOSIS — E1122 Type 2 diabetes mellitus with diabetic chronic kidney disease: Secondary | ICD-10-CM | POA: Diagnosis present

## 2017-03-12 DIAGNOSIS — E875 Hyperkalemia: Secondary | ICD-10-CM | POA: Diagnosis present

## 2017-03-12 DIAGNOSIS — Z803 Family history of malignant neoplasm of breast: Secondary | ICD-10-CM

## 2017-03-12 DIAGNOSIS — G9340 Encephalopathy, unspecified: Secondary | ICD-10-CM | POA: Diagnosis not present

## 2017-03-12 DIAGNOSIS — I132 Hypertensive heart and chronic kidney disease with heart failure and with stage 5 chronic kidney disease, or end stage renal disease: Secondary | ICD-10-CM | POA: Diagnosis present

## 2017-03-12 DIAGNOSIS — R4182 Altered mental status, unspecified: Secondary | ICD-10-CM | POA: Diagnosis not present

## 2017-03-12 DIAGNOSIS — L989 Disorder of the skin and subcutaneous tissue, unspecified: Secondary | ICD-10-CM | POA: Diagnosis not present

## 2017-03-12 DIAGNOSIS — D631 Anemia in chronic kidney disease: Secondary | ICD-10-CM | POA: Diagnosis present

## 2017-03-12 DIAGNOSIS — D509 Iron deficiency anemia, unspecified: Secondary | ICD-10-CM | POA: Diagnosis present

## 2017-03-12 DIAGNOSIS — D696 Thrombocytopenia, unspecified: Secondary | ICD-10-CM | POA: Diagnosis present

## 2017-03-12 DIAGNOSIS — I871 Compression of vein: Secondary | ICD-10-CM | POA: Diagnosis present

## 2017-03-12 DIAGNOSIS — N179 Acute kidney failure, unspecified: Principal | ICD-10-CM | POA: Diagnosis present

## 2017-03-12 DIAGNOSIS — Z8673 Personal history of transient ischemic attack (TIA), and cerebral infarction without residual deficits: Secondary | ICD-10-CM

## 2017-03-12 DIAGNOSIS — M549 Dorsalgia, unspecified: Secondary | ICD-10-CM | POA: Diagnosis present

## 2017-03-12 DIAGNOSIS — N19 Unspecified kidney failure: Secondary | ICD-10-CM | POA: Diagnosis present

## 2017-03-12 DIAGNOSIS — R627 Adult failure to thrive: Secondary | ICD-10-CM | POA: Diagnosis present

## 2017-03-12 DIAGNOSIS — I10 Essential (primary) hypertension: Secondary | ICD-10-CM | POA: Diagnosis not present

## 2017-03-12 DIAGNOSIS — Z86711 Personal history of pulmonary embolism: Secondary | ICD-10-CM | POA: Diagnosis not present

## 2017-03-12 DIAGNOSIS — G92 Toxic encephalopathy: Secondary | ICD-10-CM | POA: Diagnosis present

## 2017-03-12 DIAGNOSIS — Z9049 Acquired absence of other specified parts of digestive tract: Secondary | ICD-10-CM

## 2017-03-12 DIAGNOSIS — I5032 Chronic diastolic (congestive) heart failure: Secondary | ICD-10-CM | POA: Diagnosis present

## 2017-03-12 DIAGNOSIS — Z66 Do not resuscitate: Secondary | ICD-10-CM | POA: Diagnosis present

## 2017-03-12 DIAGNOSIS — L988 Other specified disorders of the skin and subcutaneous tissue: Secondary | ICD-10-CM | POA: Diagnosis present

## 2017-03-12 DIAGNOSIS — Z7952 Long term (current) use of systemic steroids: Secondary | ICD-10-CM

## 2017-03-12 DIAGNOSIS — Z794 Long term (current) use of insulin: Secondary | ICD-10-CM

## 2017-03-12 DIAGNOSIS — N184 Chronic kidney disease, stage 4 (severe): Secondary | ICD-10-CM | POA: Diagnosis present

## 2017-03-12 DIAGNOSIS — Z8249 Family history of ischemic heart disease and other diseases of the circulatory system: Secondary | ICD-10-CM | POA: Diagnosis not present

## 2017-03-12 DIAGNOSIS — E118 Type 2 diabetes mellitus with unspecified complications: Secondary | ICD-10-CM | POA: Diagnosis present

## 2017-03-12 DIAGNOSIS — Z833 Family history of diabetes mellitus: Secondary | ICD-10-CM

## 2017-03-12 DIAGNOSIS — L8990 Pressure ulcer of unspecified site, unspecified stage: Secondary | ICD-10-CM | POA: Insufficient documentation

## 2017-03-12 DIAGNOSIS — Z01810 Encounter for preprocedural cardiovascular examination: Secondary | ICD-10-CM | POA: Diagnosis not present

## 2017-03-12 DIAGNOSIS — Z9889 Other specified postprocedural states: Secondary | ICD-10-CM

## 2017-03-12 DIAGNOSIS — N185 Chronic kidney disease, stage 5: Secondary | ICD-10-CM

## 2017-03-12 DIAGNOSIS — E11649 Type 2 diabetes mellitus with hypoglycemia without coma: Secondary | ICD-10-CM | POA: Diagnosis present

## 2017-03-12 DIAGNOSIS — Z79899 Other long term (current) drug therapy: Secondary | ICD-10-CM

## 2017-03-12 DIAGNOSIS — N2581 Secondary hyperparathyroidism of renal origin: Secondary | ICD-10-CM | POA: Diagnosis present

## 2017-03-12 DIAGNOSIS — Z419 Encounter for procedure for purposes other than remedying health state, unspecified: Secondary | ICD-10-CM

## 2017-03-12 DIAGNOSIS — E860 Dehydration: Secondary | ICD-10-CM | POA: Diagnosis present

## 2017-03-12 DIAGNOSIS — Z7982 Long term (current) use of aspirin: Secondary | ICD-10-CM

## 2017-03-12 DIAGNOSIS — Z6841 Body Mass Index (BMI) 40.0 and over, adult: Secondary | ICD-10-CM

## 2017-03-12 DIAGNOSIS — I509 Heart failure, unspecified: Secondary | ICD-10-CM | POA: Diagnosis not present

## 2017-03-12 LAB — BASIC METABOLIC PANEL
Anion gap: 7 (ref 5–15)
BUN: 62 mg/dL — ABNORMAL HIGH (ref 6–20)
CO2: 20 mmol/L — ABNORMAL LOW (ref 22–32)
Calcium: 8 mg/dL — ABNORMAL LOW (ref 8.9–10.3)
Chloride: 111 mmol/L (ref 101–111)
Creatinine, Ser: 5.48 mg/dL — ABNORMAL HIGH (ref 0.44–1.00)
GFR calc Af Amer: 8 mL/min — ABNORMAL LOW (ref >60)
GFR calc non Af Amer: 7 mL/min — ABNORMAL LOW (ref >60)
Glucose, Bld: 91 mg/dL (ref 65–99)
Potassium: 5.6 mmol/L — ABNORMAL HIGH (ref 3.5–5.1)
Sodium: 138 mmol/L (ref 135–145)

## 2017-03-12 LAB — CBG MONITORING, ED
Glucose-Capillary: 74 mg/dL (ref 65–99)
Glucose-Capillary: 87 mg/dL (ref 65–99)
Glucose-Capillary: 86 mg/dL (ref 65–99)
Glucose-Capillary: 75 mg/dL (ref 65–99)
Glucose-Capillary: 88 mg/dL (ref 65–99)

## 2017-03-12 LAB — CBC WITH DIFFERENTIAL/PLATELET
Basophils Absolute: 0.1 10*3/uL (ref 0.0–0.1)
Basophils Relative: 1 %
Eosinophils Absolute: 0.3 10*3/uL (ref 0.0–0.7)
Eosinophils Relative: 3 %
HCT: 29.3 % — ABNORMAL LOW (ref 36.0–46.0)
Hemoglobin: 9 g/dL — ABNORMAL LOW (ref 12.0–15.0)
Lymphocytes Relative: 16 %
Lymphs Abs: 2 10*3/uL (ref 0.7–4.0)
MCH: 24.2 pg — ABNORMAL LOW (ref 26.0–34.0)
MCHC: 30.7 g/dL (ref 30.0–36.0)
MCV: 78.8 fL (ref 78.0–100.0)
Monocytes Absolute: 0.5 10*3/uL (ref 0.1–1.0)
Monocytes Relative: 4 %
Neutro Abs: 9.2 10*3/uL — ABNORMAL HIGH (ref 1.7–7.7)
Neutrophils Relative %: 77 %
Platelets: 163 10*3/uL (ref 150–400)
RBC: 3.72 MIL/uL — ABNORMAL LOW (ref 3.87–5.11)
RDW: 16.3 % — ABNORMAL HIGH (ref 11.5–15.5)
WBC: 12.1 10*3/uL — ABNORMAL HIGH (ref 4.0–10.5)

## 2017-03-12 LAB — TROPONIN I: Troponin I: <0.03 ng/mL (ref <0.03)

## 2017-03-12 LAB — GLUCOSE, CAPILLARY
Glucose-Capillary: 68 mg/dL (ref 65–99)
Glucose-Capillary: 86 mg/dL (ref 65–99)

## 2017-03-12 MED ORDER — SODIUM CHLORIDE 0.9 % IV SOLN
INTRAVENOUS | Status: DC
  Administered 2017-03-12 – 2017-03-13 (×3): via INTRAVENOUS

## 2017-03-12 MED ORDER — ONDANSETRON HCL 4 MG PO TABS
4.0000 mg | ORAL_TABLET | Freq: Four times a day (QID) | ORAL | Status: DC | PRN
Start: 2017-03-12 — End: 2017-03-22

## 2017-03-12 MED ORDER — ONDANSETRON HCL 4 MG/2ML IJ SOLN: 4.0000 mg | Freq: Four times a day (QID) | INTRAMUSCULAR | Status: DC | PRN

## 2017-03-12 MED ORDER — SODIUM CHLORIDE 0.9 % IV SOLN
1.0000 g | Freq: Once | INTRAVENOUS | Status: DC
  Filled 2017-03-12: qty 10

## 2017-03-12 MED ORDER — INSULIN ASPART 100 UNIT/ML ~~LOC~~ SOLN: 0.0000 [IU] | Freq: Three times a day (TID) | SUBCUTANEOUS | Status: DC

## 2017-03-12 MED ORDER — HEPARIN SODIUM (PORCINE) 5000 UNIT/ML IJ SOLN
5000.0000 [IU] | Freq: Three times a day (TID) | INTRAMUSCULAR | Status: DC
  Administered 2017-03-13 – 2017-03-18 (×13): 5000 [IU] via SUBCUTANEOUS
  Filled 2017-03-12 (×13): qty 1

## 2017-03-12 MED ORDER — ACETAMINOPHEN 650 MG RE SUPP: 650.0000 mg | Freq: Four times a day (QID) | RECTAL | Status: DC | PRN

## 2017-03-12 MED ORDER — SODIUM CHLORIDE 0.9% FLUSH
10.0000 mL | INTRAVENOUS | Status: DC | PRN
  Administered 2017-03-17 – 2017-03-21 (×2): 10 mL
  Filled 2017-03-12 (×2): qty 40

## 2017-03-12 MED ORDER — INSULIN ASPART 100 UNIT/ML ~~LOC~~ SOLN
0.0000 [IU] | SUBCUTANEOUS | Status: DC
  Administered 2017-03-13 – 2017-03-14 (×2): 3 [IU] via SUBCUTANEOUS
  Administered 2017-03-14 – 2017-03-15 (×2): 2 [IU] via SUBCUTANEOUS
  Administered 2017-03-15: 3 [IU] via SUBCUTANEOUS
  Administered 2017-03-15 – 2017-03-16 (×3): 1 [IU] via SUBCUTANEOUS
  Administered 2017-03-16: 2 [IU] via SUBCUTANEOUS
  Administered 2017-03-17: 3 [IU] via SUBCUTANEOUS
  Administered 2017-03-17: 2 [IU] via SUBCUTANEOUS
  Administered 2017-03-17 (×2): 3 [IU] via SUBCUTANEOUS
  Administered 2017-03-18 – 2017-03-19 (×5): 1 [IU] via SUBCUTANEOUS

## 2017-03-12 MED ORDER — DEXTROSE 50 % IV SOLN
INTRAVENOUS | Status: AC
  Administered 2017-03-13: 50 mL
  Filled 2017-03-12: qty 50

## 2017-03-12 MED ORDER — ACETAMINOPHEN 325 MG PO TABS
650.0000 mg | ORAL_TABLET | Freq: Four times a day (QID) | ORAL | Status: DC | PRN
  Administered 2017-03-16 – 2017-03-17 (×2): 650 mg via ORAL
  Filled 2017-03-12 (×2): qty 2

## 2017-03-12 MED ORDER — DEXTROSE 50 % IV SOLN
25.0000 mL | Freq: Once | INTRAVENOUS | Status: AC
  Administered 2017-03-12: 25 mL via INTRAVENOUS

## 2017-03-12 NOTE — ED Notes (Signed)
Highest amount bladder scanned was 242 ml. Informed Gerald Stabs - RN and Dr. Alcario Drought.

## 2017-03-12 NOTE — ED Provider Notes (Addendum)
[place] DEPT Provider Note   CSN: [id number] Arrival date & time: 03/12/17  1717     History   Chief Complaint Chief Complaint  Patient presents with  . Hypoglycemia    HPI Ashlee Miles is a 76 y.o. female.  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 LEVEL 5 CAVEAT FOR ALTERED MENTAL STATUS.  Per daughter, patient was discharged last week. She was admitted for TIA. Since the admission, patient has progressively declined. Pt appears more confused, somnolent and hallucinating. Today, patient had home health cme to check on her, and they recommended admission to SNF. Today pt was noted to have labile blood sugars and so EMS was called. Pt was given 5 units of insulin prior to her AMS, so EMS gave her glucagon. Her CBG was normal at arrival. Pt has no complains. She is oriented to self only.  Past Medical History:  Diagnosis Date  . CHF (congestive heart failure) ([place])   . Diabetes mellitus without complication ([place])   . History of pituitary adenoma   . Hypertension   . Renal disorder     Patient Active Problem List   Diagnosis Date Noted  . Acute renal failure superimposed on stage 4 chronic kidney disease ([place]) 03/12/2017  . Acute metabolic encephalopathy 93/71/6967  . Skin lesion of hand 03/12/2017  . TIA (transient ischemic attack) 03/01/2017  . Syncope 03/01/2017  . CKD (chronic kidney disease) stage 4, GFR 15-29 ml/min (HCC) 03/01/2017  . 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 Essential hypertension 03/01/2017  . Chronic diastolic CHF (congestive heart failure) ([place]) 03/01/2017    Past Surgical History:  Procedure Laterality Date  . CHOLECYSTECTOMY    . DIALYSIS FISTULA CREATION     clotted off  . IVC FILTER INSERTION    . PORTACATH PLACEMENT      OB History    No data available       Home Medications    Prior to  Admission medications   Medication Sig Start Date End Date Taking? Authorizing Provider  aspirin 325 MG tablet Take 1 tablet (325 mg total) by mouth daily. 03/05/17  Yes Hosie Poisson, MD  calcitRIOL (ROCALTROL) 0.5 MCG capsule Take 0.5 mcg by mouth daily.   Yes [provider]  carvedilol (COREG) 6.25 MG tablet Take 1 tablet (6.25 mg total) by mouth 2 (two) times daily. 03/04/17 03/04/18 Yes Hosie Poisson, MD  cyclobenzaprine (FLEXERIL) 10 MG tablet Take 10 mg by mouth as needed for muscle spasms.   Yes [provider]  gabapentin (NEURONTIN) 300 MG capsule Take 300 mg by mouth 3 (three) times daily.   Yes [provider]  HYDROcodone-acetaminophen (NORCO) 7.5-325 MG tablet Take 1 tablet by mouth every 6 (six) hours as needed for moderate pain.   Yes [provider]  insulin aspart protamine- aspart (NOVOLOG MIX 70/30) (70-30) 100 UNIT/ML injection Inject 40 Units into the skin daily with breakfast.   Yes [provider]  linaclotide (LINZESS) 145 MCG CAPS capsule Take 145 mcg by mouth daily before breakfast.   Yes [provider]  omeprazole (PRILOSEC) 20 MG capsule Take 20 mg by mouth daily.   Yes [provider]  ranitidine (ZANTAC) 150 MG tablet Take 150 mg by mouth 2 (two) times daily.   Yes [provider]  simvastatin (ZOCOR) 40 MG tablet Take 1 tablet (40  mg total) by mouth daily. 03/05/17  Yes Hosie Poisson, MD  triamcinolone cream (KENALOG) 0.1 % Apply 1 application topically 2 (two) times daily. Use on her hand   Yes [provider]    Family History Family History  Problem Relation Age of Onset  . Breast cancer Mother   . Diabetes Sister   . Diabetes Brother   . CAD Other   . Stroke Neg Hx     Social History Social History  Substance Use Topics  . Smoking status: Never Smoker  . Smokeless tobacco: Never Used  . Alcohol use No     Allergies   Patient has no known allergies.   Review of  Systems Review of Systems  Unable to perform ROS: Mental status change     Physical Exam Updated Vital Signs BP 106/70   Pulse 66   Temp 98.7 F (37.1 C) (Oral)   Resp 19   SpO2 100%   Physical Exam  Constitutional: She appears well-developed.  HENT:  Head: Normocephalic and atraumatic.  Eyes: EOM are normal.  Neck: Normal range of motion. Neck supple.  Cardiovascular: Normal rate.   Pulmonary/Chest: Effort normal.  Abdominal: Bowel sounds are normal.  Neurological:  Somnolent, moving all 4 extremities  Skin: Skin is warm and dry.  Nursing note and vitals reviewed.    ED Treatments / Results  Labs (all labs ordered are listed, but only abnormal results are displayed) Labs Reviewed  CBC WITH DIFFERENTIAL/PLATELET - Abnormal; Notable for the following:       Result Value   WBC 12.1 (*)    RBC 3.72 (*)    Hemoglobin 9.0 (*)    HCT 29.3 (*)    MCH 24.2 (*)    RDW 16.3 (*)    Neutro Abs 9.2 (*)    All other components within normal limits  BASIC METABOLIC PANEL - Abnormal; Notable for the following:    Potassium 5.6 (*)    CO2 20 (*)    BUN 62 (*)    Creatinine, Ser 5.48 (*)    Calcium 8.0 (*)    GFR calc non Af Amer 7 (*)    GFR calc Af Amer 8 (*)    All other components within normal limits  TROPONIN I  URINALYSIS, ROUTINE W REFLEX MICROSCOPIC  BASIC METABOLIC PANEL  CBG MONITORING, ED  CBG MONITORING, ED  CBG MONITORING, ED  CBG MONITORING, ED    EKG  EKG Interpretation  Date/Time:  Sunday March 12 2017 18:24:56 EDT Ventricular Rate:  75 PR Interval:    QRS Duration: 81 QT Interval:  399 QTC Calculation: 446 R Axis:   20 Text Interpretation:  Sinus arrhythmia Prolonged PR interval Low voltage, precordial leads No acute changes No significant change since last tracing Confirmed by Varney Biles [id number]) on 03/12/2017 6:27:39 PM       Radiology Ct Head Wo Contrast  Result Date: 03/12/2017 CLINICAL DATA:  Per EMS- pt cbg was 117 and  family administered 5 units of insulin. Pt became altered. CBG 50s for EMs. Given 10mg  Glucagon. CBG 74 upon arrival EXAM: CT HEAD WITHOUT CONTRAST TECHNIQUE: Contiguous axial images were obtained from the base of the skull through the vertex without intravenous contrast. COMPARISON:  Brain MRI, 03/02/2017.  Head CT, 03/01/2017. FINDINGS: Brain: No evidence of acute infarction, hemorrhage, hydrocephalus, extra-axial collection or mass lesion/mass effect. Vascular: No hyperdense vessel or unexpected calcification. Skull: Normal. Negative for fracture or focal lesion. Sinuses/Orbits: Globes and orbits are  unremarkable. Sinuses and mastoid air cells are clear. Other: None. IMPRESSION: No acute intracranial abnormalities. Stable appearance from the recent prior exams. Electronically Signed   By: Lajean Manes M.D.   On: 03/12/2017 19:22   Dg Chest Portable 1 View  Result Date: 03/12/2017 CLINICAL DATA:  Hypoglycemia.  Poor placement. EXAM: PORTABLE CHEST 1 VIEW COMPARISON:  Chest radiograph March 01, 2017 FINDINGS: Stable cardiomegaly. Calcified aortic knob. No pleural effusion or focal consolidation. Mild pulmonary vascular congestion. No pneumothorax. Vascular stent LEFT arm. Stable position of tunneled RIGHT Port-A-Cath distal tip projecting in mid superior vena cava, via subclavian approach. ACDF. IMPRESSION: Stable position of RIGHT Port-A-Cath. Stable cardiomegaly and pulmonary vascular congestion. Aortic Atherosclerosis (ICD10-I70.0). Electronically Signed   By: Elon Alas M.D.   On: 03/12/2017 18:18    Procedures .Critical Care Performed by: Varney Biles Authorized by: Varney Biles   Critical care provider statement:    Critical care time (minutes):  38   Critical care time was exclusive of:  Separately billable procedures and treating other patients   Critical care was necessary to treat or prevent imminent or life-threatening deterioration of the following conditions:  CNS  failure or compromise and metabolic crisis   Critical care was time spent personally by me on the following activities:  Blood draw for specimens, development of treatment plan with patient or surrogate, discussions with consultants, evaluation of patient's response to treatment, examination of patient, obtaining history from patient or surrogate, ordering and review of laboratory studies, ordering and review of radiographic studies, pulse oximetry, re-evaluation of patient's condition and review of old charts   (including critical care time)  Medications Ordered in ED Medications  0.9 %  sodium chloride infusion (not administered)  insulin aspart (novoLOG) injection 0-9 Units (not administered)  acetaminophen (TYLENOL) tablet 650 mg (not administered)    Or  acetaminophen (TYLENOL) suppository 650 mg (not administered)  ondansetron (ZOFRAN) tablet 4 mg (not administered)    Or  ondansetron (ZOFRAN) injection 4 mg (not administered)  heparin injection 5,000 Units (not administered)  dextrose 50 % solution 25 mL (not administered)     Initial Impression / Assessment and Plan / ED Course  I have reviewed the triage vital signs and the nursing notes.  Pertinent labs & imaging results that were available during my care of the patient were reviewed by me and considered in my medical decision making (see chart for details).     Pt comes in with cc of ams.  DDx includes: ICH / Stroke ACS Sepsis syndrome Infection - UTI/Pneumonia Encephalopathy  Electrolyte abnormality Drug overdose DKA Hypercapnia  Based on our evaluation pt likely has worsening uremia, possibly an underlying UTI. I dont think pt had a stroke, as the symptoms are gradually worsening. I also dont think the symptoms are due to hypoglycemia, as it was never documented as such.   Final Clinical Impressions(s) / ED Diagnoses   Final diagnoses:  Uremia  Acute encephalopathy    New Prescriptions New  Prescriptions   No medications on file     Varney Biles, MD 03/12/17 2010    Varney Biles, MD 03/12/17 2141

## 2017-03-12 NOTE — ED Notes (Signed)
Pt's CBG result was 88. Informed Gerald Stabs - RN.

## 2017-03-12 NOTE — ED Triage Notes (Signed)
Per EMS- pt cbg was 117 and family administered 5 units of insulin. Pt became altered. CBG 50s for EMs. Given 10mg  Glucagon. CBG 74 upon arrival. Pt is arousable, can answer questions. Port in chest. No IV access.

## 2017-03-12 NOTE — ED Notes (Signed)
Pt's CBG result was 86. Informed Gerald Stabs - RN.

## 2017-03-12 NOTE — ED Notes (Signed)
Report called to rn on 2w
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
## 2017-03-12 NOTE — ED Notes (Signed)
Pt in ct 

## 2017-03-12 NOTE — ED Notes (Signed)
Patient transported to CT 

## 2017-03-12 NOTE — ED Notes (Signed)
Patient transported to Ultrasound 

## 2017-03-13 LAB — BASIC METABOLIC PANEL
Anion gap: 6 (ref 5–15)
Anion gap: 7 (ref 5–15)
BUN: 65 mg/dL — ABNORMAL HIGH (ref 6–20)
BUN: 61 mg/dL — ABNORMAL HIGH (ref 6–20)
CO2: 22 mmol/L (ref 22–32)
CO2: 20 mmol/L — ABNORMAL LOW (ref 22–32)
Calcium: 8 mg/dL — ABNORMAL LOW (ref 8.9–10.3)
Calcium: 7.9 mg/dL — ABNORMAL LOW (ref 8.9–10.3)
Chloride: 115 mmol/L — ABNORMAL HIGH (ref 101–111)
Chloride: 118 mmol/L — ABNORMAL HIGH (ref 101–111)
Creatinine, Ser: 5.16 mg/dL — ABNORMAL HIGH (ref 0.44–1.00)
Creatinine, Ser: 5 mg/dL — ABNORMAL HIGH (ref 0.44–1.00)
GFR calc Af Amer: 9 mL/min — ABNORMAL LOW (ref >60)
GFR calc Af Amer: 9 mL/min — ABNORMAL LOW (ref >60)
GFR calc non Af Amer: 7 mL/min — ABNORMAL LOW (ref >60)
GFR calc non Af Amer: 8 mL/min — ABNORMAL LOW (ref >60)
Glucose, Bld: 73 mg/dL (ref 65–99)
Glucose, Bld: 48 mg/dL — ABNORMAL LOW (ref 65–99)
Potassium: 5.9 mmol/L — ABNORMAL HIGH (ref 3.5–5.1)
Potassium: 5.3 mmol/L — ABNORMAL HIGH (ref 3.5–5.1)
Sodium: 143 mmol/L (ref 135–145)
Sodium: 145 mmol/L (ref 135–145)

## 2017-03-13 LAB — URINALYSIS, ROUTINE W REFLEX MICROSCOPIC
Bilirubin Urine: NEGATIVE
Glucose, UA: NEGATIVE mg/dL
Hgb urine dipstick: NEGATIVE
Ketones, ur: NEGATIVE mg/dL
Nitrite: NEGATIVE
Protein, ur: 100 mg/dL — AB
Specific Gravity, Urine: 1.01 (ref 1.005–1.030)
pH: 6 (ref 5.0–8.0)

## 2017-03-13 LAB — GLUCOSE, CAPILLARY
Glucose-Capillary: 169 mg/dL — ABNORMAL HIGH (ref 65–99)
Glucose-Capillary: 210 mg/dL — ABNORMAL HIGH (ref 65–99)
Glucose-Capillary: 247 mg/dL — ABNORMAL HIGH (ref 65–99)
Glucose-Capillary: 37 mg/dL — CL (ref 65–99)
Glucose-Capillary: 60 mg/dL — ABNORMAL LOW (ref 65–99)
Glucose-Capillary: 71 mg/dL (ref 65–99)
Glucose-Capillary: 77 mg/dL (ref 65–99)
Glucose-Capillary: 95 mg/dL (ref 65–99)
Glucose-Capillary: 99 mg/dL (ref 65–99)

## 2017-03-13 MED ORDER — CALCITRIOL 0.5 MCG PO CAPS
0.5000 ug | ORAL_CAPSULE | Freq: Every day | ORAL | Status: DC
  Administered 2017-03-13 – 2017-03-21 (×8): 0.5 ug via ORAL
  Filled 2017-03-13 (×8): qty 1

## 2017-03-13 MED ORDER — ASPIRIN 325 MG PO TABS
325.0000 mg | ORAL_TABLET | Freq: Every day | ORAL | Status: DC
  Administered 2017-03-13 – 2017-03-21 (×8): 325 mg via ORAL
  Filled 2017-03-13 (×8): qty 1

## 2017-03-13 MED ORDER — SODIUM POLYSTYRENE SULFONATE 15 GM/60ML PO SUSP
30.0000 g | Freq: Once | ORAL | Status: AC
  Administered 2017-03-13: 30 g via ORAL
  Filled 2017-03-13: qty 120

## 2017-03-13 MED ORDER — SENNOSIDES-DOCUSATE SODIUM 8.6-50 MG PO TABS
1.0000 | ORAL_TABLET | Freq: Every day | ORAL | Status: DC
  Administered 2017-03-13 – 2017-03-21 (×9): 1 via ORAL
  Filled 2017-03-13 (×9): qty 1

## 2017-03-13 MED ORDER — GLUCAGON HCL RDNA (DIAGNOSTIC) 1 MG IJ SOLR
INTRAMUSCULAR | Status: AC
  Administered 2017-03-13: 1 mg
  Filled 2017-03-13: qty 1

## 2017-03-13 MED ORDER — DEXTROSE 50 % IV SOLN
INTRAVENOUS | Status: AC
Start: 2017-03-13 — End: 2017-03-13
  Administered 2017-03-13: 50 mL
  Filled 2017-03-13: qty 50

## 2017-03-13 MED ORDER — HYDROCODONE-ACETAMINOPHEN 5-325 MG PO TABS
1.0000 | ORAL_TABLET | Freq: Four times a day (QID) | ORAL | Status: DC | PRN
  Administered 2017-03-13: 1 via ORAL
  Filled 2017-03-13: qty 1

## 2017-03-13 MED ORDER — SIMVASTATIN 40 MG PO TABS
40.0000 mg | ORAL_TABLET | Freq: Every day | ORAL | Status: DC
  Administered 2017-03-13 – 2017-03-22 (×10): 40 mg via ORAL
  Filled 2017-03-13 (×10): qty 1

## 2017-03-13 MED ORDER — METHOCARBAMOL 500 MG PO TABS
500.0000 mg | ORAL_TABLET | Freq: Four times a day (QID) | ORAL | Status: DC
  Administered 2017-03-13 – 2017-03-18 (×18): 500 mg via ORAL
  Filled 2017-03-13 (×18): qty 1

## 2017-03-13 MED ORDER — CALCITRIOL 1 MCG/ML IV SOLN: 0.5000 ug | Freq: Every day | INTRAVENOUS | Status: DC

## 2017-03-13 MED ORDER — DEXTROSE 50 % IV SOLN
INTRAVENOUS | Status: AC
  Administered 2017-03-13: 25 mL
  Filled 2017-03-13: qty 50

## 2017-03-13 MED ORDER — PANTOPRAZOLE SODIUM 40 MG PO TBEC
40.0000 mg | DELAYED_RELEASE_TABLET | Freq: Every day | ORAL | Status: DC
  Administered 2017-03-13 – 2017-03-21 (×8): 40 mg via ORAL
  Filled 2017-03-13 (×8): qty 1

## 2017-03-13 NOTE — Progress Notes (Signed)
Hypoglycemic Event  CBG:  Results for Ashlee Miles, Ashlee Miles (MRN [id number]) as of 03/13/2017 06:47  Ref. Range 03/13/2017 06:06  Glucose-Capillary Latest Ref Range: 65 - 99 mg/dL 60 (L)    Treatment: Glucagon IM 1 mg  Symptoms: None  Follow-up CBG: Time: CBG Result: Results for Ashlee Miles, Ashlee Miles (MRN [id number]) as of 03/13/2017 06:49  Ref. Range 03/13/2017 06:42  Glucose-Capillary Latest Ref Range: 65 - 99 mg/dL 77    Possible Reasons for Event: Inadequate meal intake  Comments/MD notified:    Viviano Simas

## 2017-03-13 NOTE — Progress Notes (Signed)
New Admission Note:  Arrival Method: Stretcher  Mental Orientation: Confused. Alert to self only. Telemetry: Box 09 Assessment: Completed Skin: Warm and dry  IV: Porta cath  Pain: Denies  Tubes: N/A  Safety Measures: Safety Fall Prevention Plan initiated  Admission: Completed 2[facility]  Orientation: Patient has been orientated to the room, unit and the staff. Family: Dtr  Orders have been reviewed and implemented. Will continue to monitor the patient. Call light has been placed within reach and bed alarm has been activated.   Sima Matas BSN, RN  Phone Number: [phone]

## 2017-03-13 NOTE — Progress Notes (Signed)
Glucagon given IM. Access clotted.
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
## 2017-03-13 NOTE — Progress Notes (Signed)
Hypoglycemic Event  CBG: Results for JESUSITA, JOCELYN (MRN [id number]) as of 03/13/2017 00:12  Ref. Range 03/12/2017 23:52  Glucose-Capillary Latest Ref Range: 65 - 99 mg/dL 68    Treatment: D50 IV 50 mL  Symptoms: None  Follow-up CBG: Time: CBG Result:  Results for DOROTHY, POLHEMUS (MRN [id number]) as of 03/13/2017 00:33  Ref. Range 03/13/2017 00:30  Glucose-Capillary Latest Ref Range: 65 - 99 mg/dL 169 (H)   Possible Reasons for Event: Inadequate meal intake  Comments/MD notified:    Viviano Simas

## 2017-03-13 NOTE — Progress Notes (Signed)
PROGRESS NOTE    Ashlee Miles  FWY:[id number] DOB: [birth year] DOA: 03/12/2017 PCP: Darlina Rumpf, MD   Brief Narrative: 76 year old female with history of hypertension, type 2 diabetes, chronic back pain, chronic kidney disease stage IV presented with episode of syncope at home. Patient also with worsening mental status over last 1 week. Patient was hypoglycemic on admission. Worsening renal failure with hyperkalemia. Admitted for further evaluation. Patient was recently admitted for TIA workup.  Assessment & Plan:  # Acute toxic/metabolic encephalopathy likely in the setting of renal failure, hypoglycemia and multiple pain medications. -Patient's mental status is improved this morning. She is complaining of back pain and asking for pain medication. -CT head with no acute finding -Resume diet -minimize sedatives -PT OT evaluation  #Acute kidney injury on chronic kidney disease is stage IV likely hemodynamically mediated: Ultrasound of kidneys with no hydronephrosis or acute finding. -Check UA, strict ins and outs -Currently on gentle IV hydration, monitor BMP. -Check bladder scan -Check PTH, iron studies. Resume calcitriol. -Patient also with hyperkalemia. Treated with Kayexalate 1 dose. I consulted nephrologist.   #Type 2 diabetes with hypoglycemia on admission: Patient has A1c of 7.2 on 03/02/2017. -Currently on sliding scale. Monitor blood sugar level -Since patient is more alert by started diet. -Diabetic educator referral.  #History of TIA: Continue aspirin, zocor  #Hypertension: Blood pressure acceptable. Monitor blood pressure closely.  #Chronic diastolic congestive heart failure: Continue cardiac medication. Watch for fluid overload.  # Chronic back pain: Resume low-dose Norco and added Robaxin for muscle ceftriaxone. A stool softener ordered.  DVT prophylaxis: Heparin subcutaneous Code Status: DO NOT RESUSCITATE Family Communication: No family at  bedside Disposition Plan: Currently admitted    Consultants:   Nephrologist  Procedures: None Antimicrobials: None  Subjective: Seen and examined at bedside. Reported chronic back pain and asking for pain medication. Denied nausea vomiting chest pain or shortness of breath.  Objective: Vitals:   03/12/17 2221 03/12/17 2301 03/13/17 0450 03/13/17 0800  BP: 123/73 (!) 139/58 95/67 (!) 127/49  Pulse: 62 (!) 56 (!) 57 68  Resp: 18 (!) 21 20 14   Temp:   97.6 F (36.4 C)   TempSrc:      SpO2: 100% 100% 97% 99%  Weight:   103.4 kg (228 lb)     Intake/Output Summary (Last 24 hours) at 03/13/17 1119 Last data filed at 03/13/17 1000  Gross per 24 hour  Intake              800 ml  Output                0 ml  Net              800 ml   Filed Weights   03/13/17 0450  Weight: 103.4 kg (228 lb)    Examination:  General exam: Appears calm and comfortable  Respiratory system: Clear to auscultation. Respiratory effort normal. No wheezing or crackle Cardiovascular system: S1 & S2 heard, RRR.  No pedal edema. Gastrointestinal system: Abdomen is  soft and nontender. Normal bowel sounds heard. Central nervous system: Alert Awake and following commands. Extremities: Symmetric 5 x 5 power. Skin: No rashes, lesions or ulcers Psychiatry: Judgement and insight appear normal. Mood & affect appropriate.     Data Reviewed: I have personally reviewed following labs and imaging studies  CBC:  Recent Labs Lab 03/12/17 1800  WBC 12.1*  NEUTROABS 9.2*  HGB 9.0*  HCT 29.3*  MCV 78.8  PLT 163  Basic Metabolic Panel:  Recent Labs Lab 03/12/17 1800 03/13/17 0510  NA 138 143  K 5.6* 5.9*  CL 111 115*  CO2 20* 22  GLUCOSE 91 73  BUN 62* 65*  CREATININE 5.48* 5.16*  CALCIUM 8.0* 8.0*   GFR: Estimated Creatinine Clearance: 10.6 mL/min (A) (by C-G formula based on SCr of 5.16 mg/dL (H)). Liver Function Tests: No results for input(s): AST, ALT, ALKPHOS, BILITOT, PROT, ALBUMIN  in the last 168 hours. No results for input(s): LIPASE, AMYLASE in the last 168 hours. No results for input(s): AMMONIA in the last 168 hours. Coagulation Profile: No results for input(s): INR, PROTIME in the last 168 hours. Cardiac Enzymes:  Recent Labs Lab 03/12/17 1800  TROPONINI <0.03   BNP (last 3 results) No results for input(s): PROBNP in the last 8760 hours. HbA1C: No results for input(s): HGBA1C in the last 72 hours. CBG:  Recent Labs Lab 03/13/17 0030 03/13/17 0441 03/13/17 0606 03/13/17 0642 03/13/17 0741  GLUCAP 169* 71 60* 77 210*   Lipid Profile: No results for input(s): CHOL, HDL, LDLCALC, TRIG, CHOLHDL, LDLDIRECT in the last 72 hours. Thyroid Function Tests: No results for input(s): TSH, T4TOTAL, FREET4, T3FREE, THYROIDAB in the last 72 hours. Anemia Panel: No results for input(s): VITAMINB12, FOLATE, FERRITIN, TIBC, IRON, RETICCTPCT in the last 72 hours. Sepsis Labs: No results for input(s): PROCALCITON, LATICACIDVEN in the last 168 hours.  No results found for this or any previous visit (from the past 240 hour(s)).       Radiology Studies: Ct Head Wo Contrast  Result Date: 03/12/2017 CLINICAL DATA:  Per EMS- pt cbg was 117 and family administered 5 units of insulin. Pt became altered. CBG 50s for EMs. Given 10mg  Glucagon. CBG 74 upon arrival EXAM: CT HEAD WITHOUT CONTRAST TECHNIQUE: Contiguous axial images were obtained from the base of the skull through the vertex without intravenous contrast. COMPARISON:  Brain MRI, 03/02/2017.  Head CT, 03/01/2017. FINDINGS: Brain: No evidence of acute infarction, hemorrhage, hydrocephalus, extra-axial collection or mass lesion/mass effect. Vascular: No hyperdense vessel or unexpected calcification. Skull: Normal. Negative for fracture or focal lesion. Sinuses/Orbits: Globes and orbits are unremarkable. Sinuses and mastoid air cells are clear. Other: None. IMPRESSION: No acute intracranial abnormalities. Stable  appearance from the recent prior exams. Electronically Signed   By: Lajean Manes M.D.   On: 03/12/2017 19:22   US Renal  Result Date: 03/12/2017 CLINICAL DATA:  Acute renal failure today. Diabetes and hypertension. Chronic kidney disease stage 4. EXAM: RENAL / URINARY TRACT ULTRASOUND COMPLETE COMPARISON:  None. FINDINGS: Right Kidney: Length: 10.2 cm. Echogenicity within normal limits. No mass or hydronephrosis visualized. Minimal renal cortical thinning. Left Kidney: Length: 9.3 cm. Echogenicity within normal limits. No mass or hydronephrosis visualized. Mild renal cortical thinning. Bladder: Appears normal for degree of bladder distention. IMPRESSION: Normal size kidneys without hydronephrosis. Mild bilateral renal cortical thinning. Electronically Signed   By: Marin Olp M.D.   On: 03/12/2017 21:54   Dg Chest Portable 1 View  Result Date: 03/12/2017 CLINICAL DATA:  Hypoglycemia.  Poor placement. EXAM: PORTABLE CHEST 1 VIEW COMPARISON:  Chest radiograph March 01, 2017 FINDINGS: Stable cardiomegaly. Calcified aortic knob. No pleural effusion or focal consolidation. Mild pulmonary vascular congestion. No pneumothorax. Vascular stent LEFT arm. Stable position of tunneled RIGHT Port-A-Cath distal tip projecting in mid superior vena cava, via subclavian approach. ACDF. IMPRESSION: Stable position of RIGHT Port-A-Cath. Stable cardiomegaly and pulmonary vascular congestion. Aortic Atherosclerosis (ICD10-I70.0). Electronically Signed   By: Sandie Ano  Bloomer M.D.   On: 03/12/2017 18:18        Scheduled Meds: . heparin  5,000 Units Subcutaneous Q8H  . insulin aspart  0-9 Units Subcutaneous Q4H  . methocarbamol  500 mg Oral QID   Continuous Infusions: . sodium chloride 125 mL/hr at 03/12/17 2336     LOS: 1 day    Dron Tanna Furry, MD [facility] Pager [phone]  If 7PM-7AM, please contact night-coverage [email or web address] Password [email or web address] 03/13/2017, 11:19 AM

## 2017-03-13 NOTE — Care Management Note (Signed)
Case Management Note  Patient Details  Name: Ashlee Miles MRN: [id number] Date of Birth: May 22, 1941  Subjective/Objective:       CM following for progression and d/c planning.              Action/Plan: 03/13/2017 Noted CM referral for CSW. This CM met with pt who was unable to discuss needs. Pt daughter Mrs Jenita Seashore contacted and explained to this CM that this pt was recently hospitalized for a "mini stroke" and apparently d/c to home on 03/04/2017 with [facility]. However she continued to decline per the pt daughter and when [facility] arrived to assess the pt they recommended that she be sent to a SNF. The [facility] social worker had worked with the pt and family and the plan was for d/c to SNF on Monday, 03/13/2017 [facility]). The pt continued to decline and was readmitted to the hospital on 03/12/2017.  Pt daughter is very concerned re pt financial situation as she was forced to evacuate her home due to [facility] and has few clothes and very little money with her. Her daughter has been trying to buy her some clothes and necessary items to use at the SNF. This CM notified [place] rep of pt admission to the hospital and updated CSW, Crawford Givens of pt needs and daughter's concerns.   Expected Discharge Date:  03/17/17               Expected Discharge Plan:  [place]  In-House Referral:  Clinical Social Work  Discharge planning Services  CM Consult  Post Acute Care Choice:  NA Choice offered to:  NA  DME Arranged:  N/A DME Agency:  NA  HH Arranged:  NA HH Agency:  NA  Status of Service:  Completed, signed off  If discussed at [employer] of Stay Meetings, dates discussed:    Additional Comments:  Adron Bene, RN 03/13/2017, 12:32 PM

## 2017-03-13 NOTE — NC FL2 (Signed)
[place] LEVEL OF CARE SCREENING TOOL     IDENTIFICATION  Patient Name: Ashlee Miles Birthdate: [birth year] Sex: female Admission Date (Current Location): 03/12/2017  Combine and [place] Number:  Ashlee Miles [id number] [place] and Address:  [facility] [place]. [facility], [place] [address], [place], [place] [address]      Provider Number: [id number]  Attending Physician Name and Address:  Rosita Fire, MD  Relative Name and Phone Number:  Ashlee Miles - Daughter; [phone]; [phone] (mobile) and Ashlee Miles,Ashlee Miles; [phone] (h), [phone] (mobile)      Current Level of Care: Hospital Recommended Level of Care: [place] Prior Approval Number:    Date Approved/Denied:   PASRR Number: [id number] (Eff. 06/18/08)  Discharge Plan: SNF (Family had arranged prior to hospitalization for patient to go to [facility])    Current Diagnoses: Patient Active Problem List   Diagnosis Date Noted  . Acute renal failure superimposed on stage 4 chronic kidney disease ([place]) 03/12/2017  . Acute metabolic encephalopathy 27/74/1287  . Skin lesion of hand 03/12/2017  . TIA (transient ischemic attack) 03/01/2017  . Syncope 03/01/2017  . CKD (chronic kidney disease) stage 4, GFR 15-29 ml/min (HCC) 03/01/2017  . DM type 2 causing CKD stage 4 ([place]) 03/01/2017  . Essential hypertension 03/01/2017  . Chronic diastolic CHF (congestive heart failure) ([place]) 03/01/2017    Orientation RESPIRATION BLADDER Height & Weight     Self  Normal, O2 (3.5 L oxygen) Continent Weight: 228 lb (103.4 kg) Height:     BEHAVIORAL SYMPTOMS/MOOD NEUROLOGICAL BOWEL NUTRITION STATUS      Continent Diet (DYS 2)  AMBULATORY STATUS COMMUNICATION OF NEEDS Skin   Extensive Assist Verbally Other (Comment) (MASD to breast)                       Personal Care Assistance Level of Assistance  Bathing, Feeding, Dressing Bathing Assistance: Maximum  assistance Feeding assistance: Limited assistance (Assistance with set-up) Dressing Assistance: Maximum assistance     Functional Limitations Info  Sight, Hearing, Speech Sight Info: Impaired Hearing Info: Adequate Speech Info: Adequate    SPECIAL CARE FACTORS FREQUENCY                       Contractures Contractures Info: Not present    Additional Factors Info  Code Status, Allergies, Insulin Sliding Scale Code Status Info: DNR Allergies Info: No known allergies   Insulin Sliding Scale Info: 0-9 Units every 4 hours       Current Medications (03/13/2017):  This is the current hospital active medication list Current Facility-Administered Medications  Medication Dose Route Frequency Provider Last Rate Last Dose  . 0.9 %  sodium chloride infusion   Intravenous Continuous Rosita Fire, MD 75 mL/hr at 03/13/17 1157    . acetaminophen (TYLENOL) tablet 650 mg  650 mg Oral Q6H PRN Etta Quill, DO       Or  . acetaminophen (TYLENOL) suppository 650 mg  650 mg Rectal Q6H PRN Etta Quill, DO      . aspirin tablet 325 mg  325 mg Oral Daily Rosita Fire, MD   325 mg at 03/13/17 1153  . calcitRIOL (ROCALTROL) capsule 0.5 mcg  0.5 mcg Oral Daily Rosita Fire, MD   0.5 mcg at 03/13/17 1157  . heparin injection 5,000 Units  5,000 Units Subcutaneous Q8H Jennette Kettle M, DO   5,000 Units at 03/13/17 1443  . HYDROcodone-acetaminophen (NORCO/VICODIN)  5-325 MG per tablet 1 tablet  1 tablet Oral Q6H PRN Rosita Fire, MD   1 tablet at 03/13/17 0911  . insulin aspart (novoLOG) injection 0-9 Units  0-9 Units Subcutaneous Q4H Etta Quill, DO   3 Units at 03/13/17 1153  . methocarbamol (ROBAXIN) tablet 500 mg  500 mg Oral QID Rosita Fire, MD   500 mg at 03/13/17 1443  . ondansetron (ZOFRAN) tablet 4 mg  4 mg Oral Q6H PRN Etta Quill, DO       Or  . ondansetron [facility]) injection 4 mg  4 mg Intravenous Q6H PRN Etta Quill, DO       . pantoprazole (PROTONIX) EC tablet 40 mg  40 mg Oral Daily Rosita Fire, MD   40 mg at 03/13/17 1154  . senna-docusate (Senokot-S) tablet 1 tablet  1 tablet Oral QHS Rosita Fire, MD      . simvastatin (ZOCOR) tablet 40 mg  40 mg Oral q1800 Rosita Fire, MD      . sodium chloride flush (NS) 0.9 % injection 10-40 mL  10-40 mL Intracatheter PRN Etta Quill, DO         Discharge Medications: Please see discharge summary for a list of discharge medications.  Relevant Imaging Results:  Relevant Lab Results:   Additional Information [phone]  Sable Feil, LCSW

## 2017-03-13 NOTE — Progress Notes (Signed)
Inpatient Diabetes Program Recommendations  AACE/ADA: New Consensus Statement on Inpatient Glycemic Control (2015)  Target Ranges:  Prepandial:   less than 140 mg/dL      Peak postprandial:   less than 180 mg/dL (1-2 hours)      Critically ill patients:  140 - 180 mg/dL   Lab Results  Component Value Date   GLUCAP 247 (H) 03/13/2017   HGBA1C 7.2 (H) 03/02/2017    Review of Glycemic Control Results for Ashlee Miles, Ashlee Miles (MRN [id number]) as of 03/13/2017 15:32  Ref. Range 03/13/2017 04:41 03/13/2017 06:06 03/13/2017 06:42 03/13/2017 07:41 03/13/2017 11:23  Glucose-Capillary Latest Ref Range: 65 - 99 mg/dL 71 60 (L) 77 210 (H) 247 (H)   Diabetes history: DM2 Outpatient Diabetes medications: 70/30 Novolog mix 40 units q am Current orders for Inpatient glycemic control: Novolog correction sensitive q 4 hrs.  Inpatient Diabetes Program Recommendations:    Please consider: -Lantus 14 units daily -Novolog 3 units tid meal coverage if eats 50%  Will follow during hospitalization.  Thank you, Nani Gasser. Kailiana Granquist, RN, MSN, CDE  Diabetes Coordinator Inpatient Glycemic Control Team Team Pager [phone] (8am-5pm) 03/13/2017 3:36 PM
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
## 2017-03-14 DIAGNOSIS — I509 Heart failure, unspecified: Secondary | ICD-10-CM

## 2017-03-14 DIAGNOSIS — N185 Chronic kidney disease, stage 5: Secondary | ICD-10-CM

## 2017-03-14 DIAGNOSIS — G9340 Encephalopathy, unspecified: Secondary | ICD-10-CM

## 2017-03-14 DIAGNOSIS — L8990 Pressure ulcer of unspecified site, unspecified stage: Secondary | ICD-10-CM | POA: Insufficient documentation

## 2017-03-14 DIAGNOSIS — R4182 Altered mental status, unspecified: Secondary | ICD-10-CM

## 2017-03-14 LAB — IRON AND TIBC
Iron: 32 ug/dL (ref 28–170)
Saturation Ratios: 19 % (ref 10.4–31.8)
TIBC: 169 ug/dL — ABNORMAL LOW (ref 250–450)
UIBC: 137 ug/dL

## 2017-03-14 LAB — CBC
HCT: 30.7 % — ABNORMAL LOW (ref 36.0–46.0)
Hemoglobin: 9.6 g/dL — ABNORMAL LOW (ref 12.0–15.0)
MCH: 24.7 pg — ABNORMAL LOW (ref 26.0–34.0)
MCHC: 31.3 g/dL (ref 30.0–36.0)
MCV: 78.9 fL (ref 78.0–100.0)
Platelets: 164 10*3/uL (ref 150–400)
RBC: 3.89 MIL/uL (ref 3.87–5.11)
RDW: 16.9 % — ABNORMAL HIGH (ref 11.5–15.5)
WBC: 5.9 10*3/uL (ref 4.0–10.5)

## 2017-03-14 LAB — GLUCOSE, CAPILLARY
Glucose-Capillary: 102 mg/dL — ABNORMAL HIGH (ref 65–99)
Glucose-Capillary: 108 mg/dL — ABNORMAL HIGH (ref 65–99)
Glucose-Capillary: 116 mg/dL — ABNORMAL HIGH (ref 65–99)
Glucose-Capillary: 141 mg/dL — ABNORMAL HIGH (ref 65–99)
Glucose-Capillary: 198 mg/dL — ABNORMAL HIGH (ref 65–99)
Glucose-Capillary: 229 mg/dL — ABNORMAL HIGH (ref 65–99)
Glucose-Capillary: 98 mg/dL (ref 65–99)

## 2017-03-14 LAB — PTH, INTACT AND CALCIUM
Calcium, Total (PTH): 7.8 mg/dL — ABNORMAL LOW (ref 8.7–10.3)
PTH: 623 pg/mL — ABNORMAL HIGH (ref 15–65)

## 2017-03-14 LAB — HEMOGLOBIN A1C
Hgb A1c MFr Bld: 7.3 % — ABNORMAL HIGH (ref 4.8–5.6)
Mean Plasma Glucose: 162.81 mg/dL

## 2017-03-14 LAB — FERRITIN: Ferritin: 364 ng/mL — ABNORMAL HIGH (ref 11–307)

## 2017-03-14 LAB — RENAL FUNCTION PANEL
Albumin: 2.5 g/dL — ABNORMAL LOW (ref 3.5–5.0)
Anion gap: 4 — ABNORMAL LOW (ref 5–15)
BUN: 51 mg/dL — ABNORMAL HIGH (ref 6–20)
CO2: 22 mmol/L (ref 22–32)
Calcium: 7.8 mg/dL — ABNORMAL LOW (ref 8.9–10.3)
Chloride: 119 mmol/L — ABNORMAL HIGH (ref 101–111)
Creatinine, Ser: 4.35 mg/dL — ABNORMAL HIGH (ref 0.44–1.00)
GFR calc Af Amer: 11 mL/min — ABNORMAL LOW (ref >60)
GFR calc non Af Amer: 9 mL/min — ABNORMAL LOW (ref >60)
Glucose, Bld: 91 mg/dL (ref 65–99)
Phosphorus: 4.8 mg/dL — ABNORMAL HIGH (ref 2.5–4.6)
Potassium: 5.4 mmol/L — ABNORMAL HIGH (ref 3.5–5.1)
Sodium: 145 mmol/L (ref 135–145)

## 2017-03-14 MED ORDER — SODIUM POLYSTYRENE SULFONATE 15 GM/60ML PO SUSP
30.0000 g | Freq: Once | ORAL | Status: AC
  Administered 2017-03-14: 30 g via ORAL
  Filled 2017-03-14: qty 120

## 2017-03-14 MED ORDER — PATIROMER SORBITEX CALCIUM 8.4 G PO PACK
8.4000 g | PACK | Freq: Every day | ORAL | Status: DC
  Administered 2017-03-15: 8.4 g via ORAL
  Filled 2017-03-14 (×3): qty 4

## 2017-03-14 MED ORDER — SODIUM CHLORIDE 0.9 % IV SOLN
125.0000 mg | Freq: Every day | INTRAVENOUS | Status: AC
  Administered 2017-03-14 – 2017-03-21 (×7): 125 mg via INTRAVENOUS
  Filled 2017-03-14 (×14): qty 10

## 2017-03-14 MED ORDER — DEXTROSE 5 % IV SOLN
1.0000 g | INTRAVENOUS | Status: DC
Start: 2017-03-14 — End: 2017-03-15
  Administered 2017-03-14 – 2017-03-15 (×2): 1 g via INTRAVENOUS
  Filled 2017-03-14 (×2): qty 10
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
## 2017-03-14 NOTE — Progress Notes (Signed)
Subjective:  Patient sleepy but arousable. She did not remember any of our conversation yesterday.  Doesn't understand when she may need dialysis because she is still peeing  Objective Vital signs in last 24 hours: Vitals:   03/13/17 1800 03/13/17 2020 03/14/17 0414 03/14/17 0815  BP: (!) 134/59 128/60 130/78 131/60  Pulse: 72 74 82 92  Resp: 18 19 19 18   Temp: 98 F (36.7 C) 98.5 F (36.9 C) 98.8 F (37.1 C) 97.9 F (36.6 C)  TempSrc: Oral Oral Oral Oral  SpO2: 99% 100% 100% 100%  Weight:  103.2 kg (227 lb 8.2 oz)     Weight change: -0.22 kg (-7.8 oz)  Intake/Output Summary (Last 24 hours) at 03/14/17 1247 Last data filed at 03/14/17 8366  Gross per 24 hour  Intake             1780 ml  Output              500 ml  Net             1280 ml    Assessment/Plan: 76 year old black female with advanced CKD and now what appears to be uremic symptoms 1.Renal- patient with pretty advanced CKD at baseline.  From hospitalization last week to this week kidney function has worsened and now GFR is less than 10 ml/min. It seems that along with this is, she has had failure to thrive which I suspect may be uremia related.  I spoke to the patient and her daughter who is medical power of attorney and we were at the agreement that may be dialysis should be initiated in order to see if her clinical situation improves. I am aware that there is an issue with vascular access- I will ask vascular to see her tomorrow to see if anything in her upper vasculature is possible, if not she may need a thigh graft and a femoral catheter.  Unfortunately, patient does not understand at this time why this might be needed. We need to make sure daughter is on board if vascular has something to offer for access 2. Hypertension/volume  -  is getting IVF but also has pitting edema. Will stop fluids at this time 3. Pyuria- patient may have UTI- We'll check a urine culture 4. Anemia  - hemoglobin 9- iron stores low- . We will  replete for now- no ESA yet 5. Bones- PTH 623- phos OK- started calcitriol  6. Hyperkalemia- medical treatment for now- add veltassa for tomorrow   Jehu Mccauslin A    Labs: Basic Metabolic Panel:  Recent Labs Lab 03/13/17 0510 03/13/17 1559 03/14/17 1036  NA 143 145 145  K 5.9* 5.3* 5.4*  CL 115* 118* 119*  CO2 22 20* 22  GLUCOSE 73 48* 91  BUN 65* 61* 51*  CREATININE 5.16* 5.00* 4.35*  CALCIUM 8.0* 7.9*  7.8* 7.8*  PHOS  --   --  4.8*   Liver Function Tests:  Recent Labs Lab 03/14/17 1036  ALBUMIN 2.5*   No results for input(s): LIPASE, AMYLASE in the last 168 hours. No results for input(s): AMMONIA in the last 168 hours. CBC:  Recent Labs Lab 03/12/17 1800 03/14/17 1036  WBC 12.1* 5.9  NEUTROABS 9.2*  --   HGB 9.0* 9.6*  HCT 29.3* 30.7*  MCV 78.8 78.9  PLT 163 164   Cardiac Enzymes:  Recent Labs Lab 03/12/17 1800  TROPONINI <0.03   CBG:  Recent Labs Lab 03/13/17 2018 03/14/17 0007 03/14/17 0345 03/14/17 0811 03/14/17  1156  GLUCAP 99 116* 98 102* 108*    Iron Studies:  Recent Labs  03/14/17 1036  IRON 32  TIBC 169*  FERRITIN 364*   Studies/Results: Ct Head Wo Contrast  Result Date: 03/12/2017 CLINICAL DATA:  Per EMS- pt cbg was 117 and family administered 5 units of insulin. Pt became altered. CBG 50s for EMs. Given 10mg  Glucagon. CBG 74 upon arrival EXAM: CT HEAD WITHOUT CONTRAST TECHNIQUE: Contiguous axial images were obtained from the base of the skull through the vertex without intravenous contrast. COMPARISON:  Brain MRI, 03/02/2017.  Head CT, 03/01/2017. FINDINGS: Brain: No evidence of acute infarction, hemorrhage, hydrocephalus, extra-axial collection or mass lesion/mass effect. Vascular: No hyperdense vessel or unexpected calcification. Skull: Normal. Negative for fracture or focal lesion. Sinuses/Orbits: Globes and orbits are unremarkable. Sinuses and mastoid air cells are clear. Other: None. IMPRESSION: No acute  intracranial abnormalities. Stable appearance from the recent prior exams. Electronically Signed   By: Lajean Manes M.D.   On: 03/12/2017 19:22   US Renal  Result Date: 03/12/2017 CLINICAL DATA:  Acute renal failure today. Diabetes and hypertension. Chronic kidney disease stage 4. EXAM: RENAL / URINARY TRACT ULTRASOUND COMPLETE COMPARISON:  None. FINDINGS: Right Kidney: Length: 10.2 cm. Echogenicity within normal limits. No mass or hydronephrosis visualized. Minimal renal cortical thinning. Left Kidney: Length: 9.3 cm. Echogenicity within normal limits. No mass or hydronephrosis visualized. Mild renal cortical thinning. Bladder: Appears normal for degree of bladder distention. IMPRESSION: Normal size kidneys without hydronephrosis. Mild bilateral renal cortical thinning. Electronically Signed   By: Marin Olp M.D.   On: 03/12/2017 21:54   Dg Chest Portable 1 View  Result Date: 03/12/2017 CLINICAL DATA:  Hypoglycemia.  Poor placement. EXAM: PORTABLE CHEST 1 VIEW COMPARISON:  Chest radiograph March 01, 2017 FINDINGS: Stable cardiomegaly. Calcified aortic knob. No pleural effusion or focal consolidation. Mild pulmonary vascular congestion. No pneumothorax. Vascular stent LEFT arm. Stable position of tunneled RIGHT Port-A-Cath distal tip projecting in mid superior vena cava, via subclavian approach. ACDF. IMPRESSION: Stable position of RIGHT Port-A-Cath. Stable cardiomegaly and pulmonary vascular congestion. Aortic Atherosclerosis (ICD10-I70.0). Electronically Signed   By: Elon Alas M.D.   On: 03/12/2017 18:18   Medications: Infusions: . cefTRIAXone (ROCEPHIN)  IV      Scheduled Medications: . aspirin  325 mg Oral Daily  . calcitRIOL  0.5 mcg Oral Daily  . heparin  5,000 Units Subcutaneous Q8H  . insulin aspart  0-9 Units Subcutaneous Q4H  . methocarbamol  500 mg Oral QID  . pantoprazole  40 mg Oral Daily  . senna-docusate  1 tablet Oral QHS  . simvastatin  40 mg Oral q1800  .  sodium polystyrene  30 g Oral Once    have reviewed scheduled and prn medications.  Physical Exam: General: Obese, somnolent Heart: Regular rate and rhythm Lungs: Poor effort, decreased breath sounds at the bases Abdomen: Obese, soft, nontender Extremities: Dependent mild pitting edema Dialysis Access: None yet    03/14/2017,12:47 PM  LOS: 2 days

## 2017-03-14 NOTE — Progress Notes (Signed)
PROGRESS NOTE    Ashlee Miles  MVE:[id number] DOB: [birth year] DOA: 03/12/2017 PCP: Darlina Rumpf, MD   Brief Narrative: 76 year old female with history of hypertension, type 2 diabetes, chronic back pain, chronic kidney disease stage IV presented with episode of syncope at home. Patient also with worsening mental status over last 1 week. Patient was hypoglycemic on admission. Worsening renal failure with hyperkalemia. Admitted for further evaluation. Patient was recently admitted for TIA workup.  Assessment & Plan:  # Acute toxic/metabolic encephalopathy likely in the setting of renal failure/[place], hypoglycemia and multiple pain medications. -Patient's mental status unchanged this morning. She was alert awake and oriented to name and hospital only. -CT head with no acute finding -Holding sedatives -PT OT evaluation  #Acute kidney injury on chronic kidney disease is stage IV versus progressive security stage IV: Ultrasound of kidneys with no hydronephrosis or acute finding. Patient is nonoliguric. Urine output of 1000 cc in 24 hours -UA with possible UTI. Given encephalopathy and worsening renal failure plan to treat with IV ceftriaxone. Unknown if patient is symptomatic. Follow up culture results. Discussed with the nurse to send urine culture -Discontinue IV fluid, monitor BMP. Bladder is Unremarkable. -Serum creatinine level trending down to 4.3 today. Baseline serum creatinine level around 3.5. -Elevated PTH. On calcitriol. May need Cinacalcet, defer to nephrology. Monitor serum calcium level. -Patient also with hyperkalemia. Serum potassium level of 5.4, ordered a dose of Kayexalate -Evaluated by nephrologist, plan for initiation of renal replacement therapy for uremia and hyperkalemia. Defer to nephrology colleagues. Ordered UE vein mapping.  #Type 2 diabetes with hypoglycemia on admission: Patient has A1c of 7.2 on 03/02/2017. -Currently on sliding scale. Monitor blood sugar  level  #History of TIA: Continue aspirin, zocor  #Hypertension: Blood pressure acceptable. Monitor blood pressure closely.  #Chronic diastolic congestive heart failure: Continue cardiac medication  # Chronic back pain: Continue Robaxin for muscle ceftriaxone. Holding sedatives.  DVT prophylaxis: Heparin subcutaneous Code Status: DO NOT RESUSCITATE Family Communication: No family at bedside Disposition Plan: Currently admitted    Consultants:   Nephrologist  Procedures: None Antimicrobials: None  Subjective: Seen and examined at bedside. Patient was alert awake and oriented to herself and hospital only. Denies pain, nausea or vomiting. Review of systems Limited. Objective: Vitals:   03/13/17 1800 03/13/17 2020 03/14/17 0414 03/14/17 0815  BP: (!) 134/59 128/60 130/78 131/60  Pulse: 72 74 82 92  Resp: 18 19 19 18   Temp: 98 F (36.7 C) 98.5 F (36.9 C) 98.8 F (37.1 C) 97.9 F (36.6 C)  TempSrc: Oral Oral Oral Oral  SpO2: 99% 100% 100% 100%  Weight:  103.2 kg (227 lb 8.2 oz)      Intake/Output Summary (Last 24 hours) at 03/14/17 1230 Last data filed at 03/14/17 2836  Gross per 24 hour  Intake             1780 ml  Output              500 ml  Net             1280 ml   Filed Weights   03/13/17 0450 03/13/17 2020  Weight: 103.4 kg (228 lb) 103.2 kg (227 lb 8.2 oz)    Examination:  General exam: Lying on bed, not in distress Respiratory system: Bibasal decreased breath sound, respiratory effort normal. No wheezing Cardiovascular system: Regular rate rhythm S1-S2 normal. Trace lower extremity edema. Gastrointestinal system: Abdomen soft, nontender, nondistended. Bowel sound positive Central nervous system: Alert awake  o Skin: No rashes, lesions or ulcers  Data Reviewed: I have personally reviewed following labs and imaging studies  CBC:  Recent Labs Lab 03/12/17 1800 03/14/17 1036  WBC 12.1* 5.9  NEUTROABS 9.2*  --   HGB 9.0* 9.6*  HCT 29.3* 30.7*   MCV 78.8 78.9  PLT 163 998   Basic Metabolic Panel:  Recent Labs Lab 03/12/17 1800 03/13/17 0510 03/13/17 1559 03/14/17 1036  NA 138 143 145 145  K 5.6* 5.9* 5.3* 5.4*  CL 111 115* 118* 119*  CO2 20* 22 20* 22  GLUCOSE 91 73 48* 91  BUN 62* 65* 61* 51*  CREATININE 5.48* 5.16* 5.00* 4.35*  CALCIUM 8.0* 8.0* 7.9*  7.8* 7.8*  PHOS  --   --   --  4.8*   GFR: Estimated Creatinine Clearance: 12.6 mL/min (A) (by C-G formula based on SCr of 4.35 mg/dL (H)). Liver Function Tests:  Recent Labs Lab 03/14/17 1036  ALBUMIN 2.5*   No results for input(s): LIPASE, AMYLASE in the last 168 hours. No results for input(s): AMMONIA in the last 168 hours. Coagulation Profile: No results for input(s): INR, PROTIME in the last 168 hours. Cardiac Enzymes:  Recent Labs Lab 03/12/17 1800  TROPONINI <0.03   BNP (last 3 results) No results for input(s): PROBNP in the last 8760 hours. HbA1C: No results for input(s): HGBA1C in the last 72 hours. CBG:  Recent Labs Lab 03/13/17 2018 03/14/17 0007 03/14/17 0345 03/14/17 0811 03/14/17 1156  GLUCAP 99 116* 98 102* 108*   Lipid Profile: No results for input(s): CHOL, HDL, LDLCALC, TRIG, CHOLHDL, LDLDIRECT in the last 72 hours. Thyroid Function Tests: No results for input(s): TSH, T4TOTAL, FREET4, T3FREE, THYROIDAB in the last 72 hours. Anemia Panel:  Recent Labs  03/14/17 1036  FERRITIN 364*  TIBC 169*  IRON 32   Sepsis Labs: No results for input(s): PROCALCITON, LATICACIDVEN in the last 168 hours.  No results found for this or any previous visit (from the past 240 hour(s)).       Radiology Studies: Ct Head Wo Contrast  Result Date: 03/12/2017 CLINICAL DATA:  Per EMS- pt cbg was 117 and family administered 5 units of insulin. Pt became altered. CBG 50s for EMs. Given 10mg  Glucagon. CBG 74 upon arrival EXAM: CT HEAD WITHOUT CONTRAST TECHNIQUE: Contiguous axial images were obtained from the base of the skull through the  vertex without intravenous contrast. COMPARISON:  Brain MRI, 03/02/2017.  Head CT, 03/01/2017. FINDINGS: Brain: No evidence of acute infarction, hemorrhage, hydrocephalus, extra-axial collection or mass lesion/mass effect. Vascular: No hyperdense vessel or unexpected calcification. Skull: Normal. Negative for fracture or focal lesion. Sinuses/Orbits: Globes and orbits are unremarkable. Sinuses and mastoid air cells are clear. Other: None. IMPRESSION: No acute intracranial abnormalities. Stable appearance from the recent prior exams. Electronically Signed   By: Lajean Manes M.D.   On: 03/12/2017 19:22   US Renal  Result Date: 03/12/2017 CLINICAL DATA:  Acute renal failure today. Diabetes and hypertension. Chronic kidney disease stage 4. EXAM: RENAL / URINARY TRACT ULTRASOUND COMPLETE COMPARISON:  None. FINDINGS: Right Kidney: Length: 10.2 cm. Echogenicity within normal limits. No mass or hydronephrosis visualized. Minimal renal cortical thinning. Left Kidney: Length: 9.3 cm. Echogenicity within normal limits. No mass or hydronephrosis visualized. Mild renal cortical thinning. Bladder: Appears normal for degree of bladder distention. IMPRESSION: Normal size kidneys without hydronephrosis. Mild bilateral renal cortical thinning. Electronically Signed   By: Marin Olp M.D.   On: 03/12/2017 21:54  Dg Chest Portable 1 View  Result Date: 03/12/2017 CLINICAL DATA:  Hypoglycemia.  Poor placement. EXAM: PORTABLE CHEST 1 VIEW COMPARISON:  Chest radiograph March 01, 2017 FINDINGS: Stable cardiomegaly. Calcified aortic knob. No pleural effusion or focal consolidation. Mild pulmonary vascular congestion. No pneumothorax. Vascular stent LEFT arm. Stable position of tunneled RIGHT Port-A-Cath distal tip projecting in mid superior vena cava, via subclavian approach. ACDF. IMPRESSION: Stable position of RIGHT Port-A-Cath. Stable cardiomegaly and pulmonary vascular congestion. Aortic Atherosclerosis (ICD10-I70.0).  Electronically Signed   By: Elon Alas M.D.   On: 03/12/2017 18:18        Scheduled Meds: . aspirin  325 mg Oral Daily  . calcitRIOL  0.5 mcg Oral Daily  . heparin  5,000 Units Subcutaneous Q8H  . insulin aspart  0-9 Units Subcutaneous Q4H  . methocarbamol  500 mg Oral QID  . pantoprazole  40 mg Oral Daily  . senna-docusate  1 tablet Oral QHS  . simvastatin  40 mg Oral q1800  . sodium polystyrene  30 g Oral Once   Continuous Infusions:    LOS: 2 days    Dron Tanna Furry, MD [facility] Pager [phone]  If 7PM-7AM, please contact night-coverage [email or web address] Password [email or web address] 03/14/2017, 12:30 PM
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
## 2017-03-14 NOTE — Clinical Social Work Note (Signed)
Consult received for SNF placement. CSW informed by nurse case manager on 9/24 that  family worked with patient's home health agency for SNF placement, prior to admission to hospital and [facility] chosen. CSW will follow-up with family (daughter [name]) and facilitate discharge to SNF when medically stable.  [place] Sigmon Givens, MSW, LCSW Licensed Clinical Social Worker [place] [phone]
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
## 2017-03-15 ENCOUNTER — Inpatient Hospital Stay (HOSPITAL_COMMUNITY): Payer: 59

## 2017-03-15 DIAGNOSIS — Z01810 Encounter for preprocedural cardiovascular examination: Secondary | ICD-10-CM

## 2017-03-15 DIAGNOSIS — N185 Chronic kidney disease, stage 5: Secondary | ICD-10-CM

## 2017-03-15 LAB — RENAL FUNCTION PANEL
Albumin: 2.5 g/dL — ABNORMAL LOW (ref 3.5–5.0)
Anion gap: 9 (ref 5–15)
BUN: 45 mg/dL — ABNORMAL HIGH (ref 6–20)
CO2: 23 mmol/L (ref 22–32)
Calcium: 7.8 mg/dL — ABNORMAL LOW (ref 8.9–10.3)
Chloride: 115 mmol/L — ABNORMAL HIGH (ref 101–111)
Creatinine, Ser: 4.04 mg/dL — ABNORMAL HIGH (ref 0.44–1.00)
GFR calc Af Amer: 12 mL/min — ABNORMAL LOW (ref >60)
GFR calc non Af Amer: 10 mL/min — ABNORMAL LOW (ref >60)
Glucose, Bld: 84 mg/dL (ref 65–99)
Phosphorus: 4 mg/dL (ref 2.5–4.6)
Potassium: 5.1 mmol/L (ref 3.5–5.1)
Sodium: 147 mmol/L — ABNORMAL HIGH (ref 135–145)

## 2017-03-15 LAB — URINE CULTURE

## 2017-03-15 LAB — CBC
HCT: 29.8 % — ABNORMAL LOW (ref 36.0–46.0)
Hemoglobin: 9.1 g/dL — ABNORMAL LOW (ref 12.0–15.0)
MCH: 24.1 pg — ABNORMAL LOW (ref 26.0–34.0)
MCHC: 30.5 g/dL (ref 30.0–36.0)
MCV: 78.8 fL (ref 78.0–100.0)
Platelets: 169 10*3/uL (ref 150–400)
RBC: 3.78 MIL/uL — ABNORMAL LOW (ref 3.87–5.11)
RDW: 16.2 % — ABNORMAL HIGH (ref 11.5–15.5)
WBC: 7.8 10*3/uL (ref 4.0–10.5)

## 2017-03-15 LAB — GLUCOSE, CAPILLARY
Glucose-Capillary: 103 mg/dL — ABNORMAL HIGH (ref 65–99)
Glucose-Capillary: 104 mg/dL — ABNORMAL HIGH (ref 65–99)
Glucose-Capillary: 195 mg/dL — ABNORMAL HIGH (ref 65–99)
Glucose-Capillary: 211 mg/dL — ABNORMAL HIGH (ref 65–99)
Glucose-Capillary: 68 mg/dL (ref 65–99)
Glucose-Capillary: 86 mg/dL (ref 65–99)
Glucose-Capillary: 89 mg/dL (ref 65–99)

## 2017-03-15 MED ORDER — CEFAZOLIN SODIUM-DEXTROSE 1-4 GM/50ML-% IV SOLN
1.0000 g | INTRAVENOUS | Status: AC
Start: 2017-03-16 — End: 2017-03-16
  Administered 2017-03-16: 2 g via INTRAVENOUS
  Filled 2017-03-15 (×2): qty 50
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
## 2017-03-15 NOTE — Anesthesia Preprocedure Evaluation (Addendum)
Anesthesia Evaluation  Patient identified by MRN, date of birth, ID band Patient awake    Reviewed: Allergy & Precautions, NPO status , Patient's Chart, lab work & pertinent test results  Airway Mallampati: II  TM Distance: >3 FB Neck ROM: Full    Dental no notable dental hx.    Pulmonary neg pulmonary ROS,    Pulmonary exam normal breath sounds clear to auscultation       Cardiovascular hypertension, Pt. on medications +CHF  Normal cardiovascular exam Rhythm:Regular Rate:Normal  Echo 03/02/17 - Left ventricle: The cavity size was normal. Wall thickness was increased in a pattern of mild LVH. Systolic function was normal. The estimated ejection fraction was in the range of 55% to 60%. Wall motion was normal; there were no regional wall motion abnormalities. Doppler parameters are consistent with abnormal left ventricular relaxation (grade 1 diastolic dysfunction). - Mitral valve: There was mild regurgitation.   Neuro/Psych TIAnegative psych ROS   GI/Hepatic negative GI ROS, Neg liver ROS,   Endo/Other  diabetesMorbid obesity  Renal/GU Renal disease     Musculoskeletal negative musculoskeletal ROS (+)   Abdominal (+) + obese,   Peds  Hematology negative hematology ROS (+)   Anesthesia Other Findings   Reproductive/Obstetrics negative OB ROS                             Anesthesia Physical Anesthesia Plan  ASA: III  Anesthesia Plan: General   Post-op Pain Management:    Induction: Intravenous  PONV Risk Score and Plan: 3 and Ondansetron and Treatment may vary due to age or medical condition  Airway Management Planned: Oral ETT  Additional Equipment:   Intra-op Plan:   Post-operative Plan: Extubation in OR  Informed Consent: I have reviewed the patients History and Physical, chart, labs and discussed the procedure including the risks, benefits and alternatives for the proposed  anesthesia with the patient or authorized representative who has indicated his/her understanding and acceptance.   Dental advisory given  Plan Discussed with: CRNA  Anesthesia Plan Comments:        Anesthesia Quick Evaluation

## 2017-03-15 NOTE — Progress Notes (Signed)
PROGRESS NOTE    Ashlee Miles  JIR:[id number] DOB: 05-26-1941 DOA: 03/12/2017 PCP: Darlina Rumpf, MD   Brief Narrative: 76 year old female with history of hypertension, type 2 diabetes, chronic back pain, chronic kidney disease stage IV presented with episode of syncope at home. Patient also with worsening mental status over last 1 week. Patient was hypoglycemic on admission. Worsening renal failure with hyperkalemia. Admitted for further evaluation. Patient was recently admitted for TIA workup.  Assessment & Plan:  # Acute encephalopathy likely uremic/metabolic in the setting of renal failure and multiple pain medications. -Patient was alert awake and oriented this morning but she was weak. -CT head with no acute finding -Holding sedatives -PT OT evaluation  #Acute kidney injury on chronic kidney disease is stage IV versus progressive security stage IV with uremic symptoms: Ultrasound of kidneys with no hydronephrosis or acute finding.  -Serum creatinine level trending down. Patient is off IV fluids. Urine output is not measured. Plan for initiation of hemodialysis treatment for uremic symptoms as per nephrologist. Evaluated by vascular surgery for placement of tunneled catheter and thigh graft. Nephrology consult appreciated. Monitor BMP, electrolytes and blood pressure. -Urine culture likely contaminant. Patient with no urinary symptoms. Discontinue antibiotics. Patient with pyuria, asymptomatic.  #Type 2 diabetes with hypoglycemia on admission: Patient has A1c of 7.2 on 03/02/2017. -Currently on sliding scale. Monitor blood sugar level  #History of TIA: Continue aspirin, zocor  #Hypertension: Blood pressure acceptable. Monitor blood pressure closely.  #Chronic diastolic congestive heart failure: Continue cardiac medication  # Chronic back pain: Continue Robaxin for muscle relaxant. Holding sedatives.  DVT prophylaxis: Heparin subcutaneous Code Status: DO NOT RESUSCITATE Family  Communication: No family at bedside Disposition Plan: Currently admitted    Consultants:   Nephrologist  Vascular surgery  Procedures: None Antimicrobials: None  Subjective: Seen and examined at bedside. Denied headache, dizziness, nausea vomiting just shortness of breath. Objective: Vitals:   03/14/17 1811 03/14/17 2007 03/15/17 0447 03/15/17 0900  BP: (!) 136/41 129/69 118/73 (!) 157/43  Pulse: 85 82 88 80  Resp: 18 19 20 19   Temp: 97.7 F (36.5 C) 98.4 F (36.9 C) 98.4 F (36.9 C) 98.3 F (36.8 C)  TempSrc: Oral Oral Oral Oral  SpO2: 100% 100% 100% 100%  Weight:  103.4 kg (227 lb 15.3 oz)      Intake/Output Summary (Last 24 hours) at 03/15/17 1333 Last data filed at 03/15/17 1007  Gross per 24 hour  Intake              880 ml  Output              200 ml  Net              680 ml   Filed Weights   03/13/17 0450 03/13/17 2020 03/14/17 2007  Weight: 103.4 kg (228 lb) 103.2 kg (227 lb 8.2 oz) 103.4 kg (227 lb 15.3 oz)    Examination:  General exam: Lying on bed comfortable, not in distress Respiratory system: Bibasal decreased breath sound, respiratory effort normal, no wheezing Cardiovascular system: Regular rate rhythm, S1 is normal. Trace lower extremity edema. No drops Gastrointestinal system: Abdomen soft, nontender. Bowel sound positive Central nervous system: Alert awake and oriented 3 oriented to September 2018, [facility] and name. Skin: No rashes, lesions or ulcers  Data Reviewed: I have personally reviewed following labs and imaging studies  CBC:  Recent Labs Lab 03/12/17 1800 03/14/17 1036 03/15/17 0449  WBC 12.1* 5.9 7.8  NEUTROABS  9.2*  --   --   HGB 9.0* 9.6* 9.1*  HCT 29.3* 30.7* 29.8*  MCV 78.8 78.9 78.8  PLT 163 164 335   Basic Metabolic Panel:  Recent Labs Lab 03/12/17 1800 03/13/17 0510 03/13/17 1559 03/14/17 1036 03/15/17 0449  NA 138 143 145 145 147*  K 5.6* 5.9* 5.3* 5.4* 5.1  CL 111 115* 118* 119* 115*    CO2 20* 22 20* 22 23  GLUCOSE 91 73 48* 91 84  BUN 62* 65* 61* 51* 45*  CREATININE 5.48* 5.16* 5.00* 4.35* 4.04*  CALCIUM 8.0* 8.0* 7.9*  7.8* 7.8* 7.8*  PHOS  --   --   --  4.8* 4.0   GFR: Estimated Creatinine Clearance: 13.6 mL/min (A) (by C-G formula based on SCr of 4.04 mg/dL (H)). Liver Function Tests:  Recent Labs Lab 03/14/17 1036 03/15/17 0449  ALBUMIN 2.5* 2.5*   No results for input(s): LIPASE, AMYLASE in the last 168 hours. No results for input(s): AMMONIA in the last 168 hours. Coagulation Profile: No results for input(s): INR, PROTIME in the last 168 hours. Cardiac Enzymes:  Recent Labs Lab 03/12/17 1800  TROPONINI <0.03   BNP (last 3 results) No results for input(s): PROBNP in the last 8760 hours. HbA1C:  Recent Labs  03/14/17 1036  HGBA1C 7.3*   CBG:  Recent Labs Lab 03/14/17 2346 03/15/17 0441 03/15/17 0530 03/15/17 0827 03/15/17 1244  GLUCAP 141* 68 103* 86 195*   Lipid Profile: No results for input(s): CHOL, HDL, LDLCALC, TRIG, CHOLHDL, LDLDIRECT in the last 72 hours. Thyroid Function Tests: No results for input(s): TSH, T4TOTAL, FREET4, T3FREE, THYROIDAB in the last 72 hours. Anemia Panel:  Recent Labs  03/14/17 1036  FERRITIN 364*  TIBC 169*  IRON 32   Sepsis Labs: No results for input(s): PROCALCITON, LATICACIDVEN in the last 168 hours.  Recent Results (from the past 240 hour(s))  Urine Culture     Status: Abnormal   Collection Time: 03/13/17  4:05 PM  Result Value Ref Range Status   Specimen Description URINE, RANDOM  Final   Special Requests NONE  Final   Culture MULTIPLE SPECIES PRESENT, SUGGEST RECOLLECTION (A)  Final   Report Status 03/15/2017 FINAL  Final         Radiology Studies: No results found.      Scheduled Meds: . aspirin  325 mg Oral Daily  . calcitRIOL  0.5 mcg Oral Daily  . heparin  5,000 Units Subcutaneous Q8H  . insulin aspart  0-9 Units Subcutaneous Q4H  . methocarbamol  500 mg Oral  QID  . pantoprazole  40 mg Oral Daily  . patiromer  8.4 g Oral Daily  . senna-docusate  1 tablet Oral QHS  . simvastatin  40 mg Oral q1800   Continuous Infusions: . ferric gluconate (FERRLECIT/NULECIT) IV 125 mg (03/15/17 1059)     LOS: 3 days    Lynx Goodrich Tanna Furry, MD [facility] Pager [phone]  If 7PM-7AM, please contact night-coverage [email or web address] Password [email or web address] 03/15/2017, 1:33 PM

## 2017-03-15 NOTE — Progress Notes (Signed)
   VASCULAR SURGERY ASSESSMENT & PLAN:   I tentatively have her scheduled for placement of a right femoral tunneled dialysis catheter and a left thigh AV graft tomorrow, if she and her family are agreeable. I discussed the case with Dr. Moshe Cipro who will also discussed this with the family. I have discussed this again with the patient this morning. We have discussed indications for surgery and potential complications. She is not sure whether or not she wants to proceed and would like to speak to her family. We'll  SUBJECTIVE:   No specific complaints this morning.  PHYSICAL EXAM:   Vitals:   03/14/17 0815 03/14/17 1811 03/14/17 2007 03/15/17 0447  BP: 131/60 (!) 136/41 129/69 118/73  Pulse: 92 85 82 88  Resp: 18 18 19 20   Temp: 97.9 F (36.6 C) 97.7 F (36.5 C) 98.4 F (36.9 C) 98.4 F (36.9 C)  TempSrc: Oral Oral Oral Oral  SpO2: 100% 100% 100% 100%  Weight:   227 lb 15.3 oz (103.4 kg)    Lungs are clear. Palpable dorsalis pedis pulses.  LABS:   Lab Results  Component Value Date   WBC 7.8 03/15/2017   HGB 9.1 (L) 03/15/2017   HCT 29.8 (L) 03/15/2017   MCV 78.8 03/15/2017   PLT 169 03/15/2017   Lab Results  Component Value Date   CREATININE 4.04 (H) 03/15/2017   Lab Results  Component Value Date   INR 1.02 03/01/2017   CBG (last 3)   Recent Labs  03/14/17 2346 03/15/17 0441 03/15/17 0530  GLUCAP 141* 68 103*    PROBLEM LIST:    Principal Problem:   Acute renal failure superimposed on stage 4 chronic kidney disease ([place]) Active Problems:   CKD (chronic kidney disease) stage 4, GFR 15-29 ml/min (HCC)   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 aspirin  325 mg Oral Daily  . calcitRIOL  0.5 mcg Oral Daily  . heparin  5,000 Units Subcutaneous Q8H    . insulin aspart  0-9 Units Subcutaneous Q4H  . methocarbamol  500 mg Oral QID  . pantoprazole  40 mg Oral Daily  . patiromer  8.4 g Oral Daily  . senna-docusate  1 tablet Oral QHS  . simvastatin  40 mg Oral q1800    Gae Gallop Beeper: [phone] Office: [phone] 03/15/2017

## 2017-03-15 NOTE — Progress Notes (Signed)
Pt CBG-68, tech gave pt 4 oz of juice. Rechecked CBG-103. Will continue to monitor.

## 2017-03-15 NOTE — Progress Notes (Signed)
Subjective:  Patient more alert today. She is not recalling our full conversations.  Is accepting of the plan to go ahead and start HD to see if clinically she will improve  Objective Vital signs in last 24 hours: Vitals:   03/14/17 1811 03/14/17 2007 03/15/17 0447 03/15/17 0900  BP: (!) 136/41 129/69 118/73 (!) 157/43  Pulse: 85 82 88 80  Resp: 18 19 20 19   Temp: 97.7 F (36.5 C) 98.4 F (36.9 C) 98.4 F (36.9 C) 98.3 F (36.8 C)  TempSrc: Oral Oral Oral Oral  SpO2: 100% 100% 100% 100%  Weight:  103.4 kg (227 lb 15.3 oz)     Weight change: 0.2 kg (7.1 oz)  Intake/Output Summary (Last 24 hours) at 03/15/17 1235 Last data filed at 03/15/17 1007  Gross per 24 hour  Intake              880 ml  Output              200 ml  Net              680 ml    Assessment/Plan: 76 year old black female with advanced CKD and now what appears to be uremic symptoms 1.Renal- patient with pretty advanced CKD at baseline.  From hospitalization last week to this week kidney function has worsened and now GFR of around 10 ml/min. It seems that along with this is, she has had failure to thrive which I suspect may be uremia related.  I spoke to the patient and her daughter Ashlee Miles who is medical power of attorney and we were at the agreement that  dialysis should be initiated in order to see if her clinical situation improves. I am aware that there is an issue with vascular access- she will need a thigh graft and a femoral catheter.  Plan is for placement of those tomorrow and initiation of HD- pt and daughter accepting  2. Hypertension/volume  -  have stopped IVF- I think was just diluting her blood as no UOP has been recorded  3. Pyuria- patient may have UTI- urine culture just showed many species 4. Anemia  - hemoglobin 9- iron stores low- . We will replete iron for now- start ESA when starting HD 5. Bones- PTH 623- phos OK- started calcitriol  6. Hyperkalemia- medical treatment for now- added veltassa     Jakiyah Stepney A    Labs: Basic Metabolic Panel:  Recent Labs Lab 03/13/17 1559 03/14/17 1036 03/15/17 0449  NA 145 145 147*  K 5.3* 5.4* 5.1  CL 118* 119* 115*  CO2 20* 22 23  GLUCOSE 48* 91 84  BUN 61* 51* 45*  CREATININE 5.00* 4.35* 4.04*  CALCIUM 7.9*  7.8* 7.8* 7.8*  PHOS  --  4.8* 4.0   Liver Function Tests:  Recent Labs Lab 03/14/17 1036 03/15/17 0449  ALBUMIN 2.5* 2.5*   No results for input(s): LIPASE, AMYLASE in the last 168 hours. No results for input(s): AMMONIA in the last 168 hours. CBC:  Recent Labs Lab 03/12/17 1800 03/14/17 1036 03/15/17 0449  WBC 12.1* 5.9 7.8  NEUTROABS 9.2*  --   --   HGB 9.0* 9.6* 9.1*  HCT 29.3* 30.7* 29.8*  MCV 78.8 78.9 78.8  PLT 163 164 169   Cardiac Enzymes:  Recent Labs Lab 03/12/17 1800  TROPONINI <0.03   CBG:  Recent Labs Lab 03/14/17 2007 03/14/17 2346 03/15/17 0441 03/15/17 0530 03/15/17 0827  GLUCAP 229* 141* 68 103* 86  Iron Studies:   Recent Labs  03/14/17 1036  IRON 32  TIBC 169*  FERRITIN 364*   Studies/Results: No results found. Medications: Infusions: . cefTRIAXone (ROCEPHIN)  IV Stopped (03/14/17 1518)  . ferric gluconate (FERRLECIT/NULECIT) IV 125 mg (03/15/17 1059)    Scheduled Medications: . aspirin  325 mg Oral Daily  . calcitRIOL  0.5 mcg Oral Daily  . heparin  5,000 Units Subcutaneous Q8H  . insulin aspart  0-9 Units Subcutaneous Q4H  . methocarbamol  500 mg Oral QID  . pantoprazole  40 mg Oral Daily  . patiromer  8.4 g Oral Daily  . senna-docusate  1 tablet Oral QHS  . simvastatin  40 mg Oral q1800    have reviewed scheduled and prn medications.  Physical Exam: General: Obese, more alert today  Heart: Regular rate and rhythm Lungs: Poor effort, decreased breath sounds at the bases Abdomen: Obese, soft, nontender Extremities: Dependent pitting edema Dialysis Access: None yet    03/15/2017,12:35 PM  LOS: 3 days
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
## 2017-03-15 NOTE — Progress Notes (Signed)
Bilateral Upper Extremity Vein Map  Right Upper Extremity Vein Map  Basilic  Segment Diameter Depth Comment  1. Axilla 4.6 mm 8.9 mm   2. Mid upper arm 2.6 mm 21.8 mm Branch  3. Above AC 2.4 mm 16.6 mm Branch  4. In AC 1.8 mm 12.4 mm   5. Below AC 1.7 mm 9.3 mm Branch  6. Mid forearm 1.2 mm 5.7 mm   7. Wrist 1 mm 4.4 mm    Cephalic  Segment Diameter Depth Comment  1. Axilla 2.4 mm 17.1 mm   2. Mid upper arm 2.1 mm 13.1 mm   3. Above AC 1.4 mm 11.6 mm   4. In AC 1.1 mm 5.3 mm   5. Below AC 0.9 mm 5 mm   6. Mid forearm 1.2 mm 7.8 mm   7. Wrist 1.8 mm 5.8 mm     Left Upper Extremity Vein Map  Basilic  Segment Diameter Depth Comment  1. Axilla 4 mm 16.9 mm   2. Mid upper arm 2.9 mm 31.6 mm   3. Above AC 2.6 mm 18.1 mm Branch  4. In AC 1.8 mm 14.3 mm   5. Below AC 1.5 mm 11.8 mm   6. Mid forearm 1.4 mm 4.4 mm   7. Wrist 0.8 mm 4.5 mm    Cephalic  Segment Diameter Depth Comment  1. Axilla 2.4 mm 17.2 mm   2. Mid upper arm 2.7 mm 13.3 mm Branch  3. Above AC 2.1 mm 12.6 mm   4. In AC 3.1 mm 6.1 mm   5. Below AC 2 mm 4.3 mm   6. Mid forearm 1.2 mm 8.3 mm   7. Wrist 1.4 mm 6.7 mm    03/15/17 12:06 PM Carlos Levering RVT

## 2017-03-15 NOTE — Progress Notes (Signed)
Spoke with daughter Roswell Nickel by phone to clarify insulin dosages. Met with patient @ bedside yesterday but patient was unable to remember how she takes her insulin. Daughter states her mom took Novolog 70/30 mix up to 40 units if fasting CBG is high. Daughter does not know the insulin sliding scale that her mom has been using. Will follow.  Thank you, Nani Gasser. Kamarie Veno, RN, MSN, CDE  Diabetes Coordinator Inpatient Glycemic Control Team Team Pager [phone] (8am-5pm) 03/15/2017 2:21 PM

## 2017-03-16 ENCOUNTER — Encounter (HOSPITAL_COMMUNITY): Payer: Self-pay | Admitting: Certified Registered"

## 2017-03-16 ENCOUNTER — Inpatient Hospital Stay (HOSPITAL_COMMUNITY): Payer: 59

## 2017-03-16 ENCOUNTER — Inpatient Hospital Stay (HOSPITAL_COMMUNITY): Payer: 59 | Admitting: Anesthesiology

## 2017-03-16 ENCOUNTER — Encounter (HOSPITAL_COMMUNITY)
Admission: EM | Disposition: A | Discharge status: Home / Self Care | Payer: Self-pay | Source: Home / Self Care | Attending: Nephrology

## 2017-03-16 HISTORY — PX: INSERTION OF DIALYSIS CATHETER: SHX1324

## 2017-03-16 HISTORY — PX: AV FISTULA PLACEMENT: SHX1204

## 2017-03-16 LAB — RENAL FUNCTION PANEL
Albumin: 2.2 g/dL — ABNORMAL LOW (ref 3.5–5.0)
Anion gap: 5 (ref 5–15)
BUN: 39 mg/dL — ABNORMAL HIGH (ref 6–20)
CO2: 23 mmol/L (ref 22–32)
Calcium: 8 mg/dL — ABNORMAL LOW (ref 8.9–10.3)
Chloride: 117 mmol/L — ABNORMAL HIGH (ref 101–111)
Creatinine, Ser: 3.54 mg/dL — ABNORMAL HIGH (ref 0.44–1.00)
GFR calc Af Amer: 14 mL/min — ABNORMAL LOW (ref >60)
GFR calc non Af Amer: 12 mL/min — ABNORMAL LOW (ref >60)
Glucose, Bld: 103 mg/dL — ABNORMAL HIGH (ref 65–99)
Phosphorus: 4.1 mg/dL (ref 2.5–4.6)
Potassium: 4.7 mmol/L (ref 3.5–5.1)
Sodium: 145 mmol/L (ref 135–145)

## 2017-03-16 LAB — GLUCOSE, CAPILLARY
Glucose-Capillary: 101 mg/dL — ABNORMAL HIGH (ref 65–99)
Glucose-Capillary: 102 mg/dL — ABNORMAL HIGH (ref 65–99)
Glucose-Capillary: 128 mg/dL — ABNORMAL HIGH (ref 65–99)
Glucose-Capillary: 129 mg/dL — ABNORMAL HIGH (ref 65–99)
Glucose-Capillary: 143 mg/dL — ABNORMAL HIGH (ref 65–99)
Glucose-Capillary: 173 mg/dL — ABNORMAL HIGH (ref 65–99)
Glucose-Capillary: 222 mg/dL — ABNORMAL HIGH (ref 65–99)

## 2017-03-16 LAB — SURGICAL PCR SCREEN
MRSA, PCR: NEGATIVE
Staphylococcus aureus: NEGATIVE

## 2017-03-16 SURGERY — INSERTION OF ARTERIOVENOUS (AV) GORE-TEX GRAFT THIGH
Anesthesia: General | Site: Leg Upper | Laterality: Right

## 2017-03-16 MED ORDER — ONDANSETRON HCL 4 MG/2ML IJ SOLN
INTRAMUSCULAR | Status: DC | PRN
  Administered 2017-03-16: 4 mg via INTRAVENOUS

## 2017-03-16 MED ORDER — LIDOCAINE-EPINEPHRINE (PF) 1 %-1:200000 IJ SOLN
INTRAMUSCULAR | Status: AC
  Filled 2017-03-16: qty 30

## 2017-03-16 MED ORDER — SUGAMMADEX SODIUM 200 MG/2ML IV SOLN: INTRAVENOUS | Status: DC | PRN

## 2017-03-16 MED ORDER — EPHEDRINE SULFATE-NACL 50-0.9 MG/10ML-% IV SOSY
PREFILLED_SYRINGE | INTRAVENOUS | Status: DC | PRN
  Administered 2017-03-16 (×2): 5 mg via INTRAVENOUS

## 2017-03-16 MED ORDER — ONDANSETRON HCL 4 MG/2ML IJ SOLN
INTRAMUSCULAR | Status: AC
  Filled 2017-03-16: qty 2

## 2017-03-16 MED ORDER — SUCCINYLCHOLINE CHLORIDE 200 MG/10ML IV SOSY
PREFILLED_SYRINGE | INTRAVENOUS | Status: DC | PRN
  Administered 2017-03-16: 100 mg via INTRAVENOUS

## 2017-03-16 MED ORDER — EPHEDRINE 5 MG/ML INJ
INTRAVENOUS | Status: AC
  Filled 2017-03-16: qty 10

## 2017-03-16 MED ORDER — LIDOCAINE 2% (20 MG/ML) 5 ML SYRINGE
INTRAMUSCULAR | Status: DC | PRN
  Administered 2017-03-16: 100 mg via INTRAVENOUS

## 2017-03-16 MED ORDER — PHENYLEPHRINE 40 MCG/ML (10ML) SYRINGE FOR IV PUSH (FOR BLOOD PRESSURE SUPPORT)
PREFILLED_SYRINGE | INTRAVENOUS | Status: DC | PRN
  Administered 2017-03-16 (×3): 80 ug via INTRAVENOUS
  Administered 2017-03-16: 120 ug via INTRAVENOUS
  Administered 2017-03-16 (×2): 80 ug via INTRAVENOUS

## 2017-03-16 MED ORDER — SODIUM CHLORIDE 0.9 % IV SOLN
INTRAVENOUS | Status: DC | PRN
  Administered 2017-03-16: 500 mL

## 2017-03-16 MED ORDER — ALTEPLASE 2 MG IJ SOLR: 2.0000 mg | Freq: Once | INTRAMUSCULAR | Status: DC | PRN

## 2017-03-16 MED ORDER — HEPARIN SODIUM (PORCINE) 1000 UNIT/ML IJ SOLN
INTRAMUSCULAR | Status: DC | PRN
  Administered 2017-03-16: 1000 [IU]

## 2017-03-16 MED ORDER — ROCURONIUM BROMIDE 10 MG/ML (PF) SYRINGE
PREFILLED_SYRINGE | INTRAVENOUS | Status: AC
  Filled 2017-03-16: qty 5

## 2017-03-16 MED ORDER — DARBEPOETIN ALFA 60 MCG/0.3ML IJ SOSY
60.0000 ug | PREFILLED_SYRINGE | INTRAMUSCULAR | Status: DC
  Filled 2017-03-16: qty 0.3

## 2017-03-16 MED ORDER — PHENYLEPHRINE 40 MCG/ML (10ML) SYRINGE FOR IV PUSH (FOR BLOOD PRESSURE SUPPORT)
PREFILLED_SYRINGE | INTRAVENOUS | Status: AC
  Filled 2017-03-16: qty 10

## 2017-03-16 MED ORDER — FENTANYL CITRATE (PF) 100 MCG/2ML IJ SOLN: 25.0000 ug | INTRAMUSCULAR | Status: DC | PRN

## 2017-03-16 MED ORDER — 0.9 % SODIUM CHLORIDE (POUR BTL) OPTIME
TOPICAL | Status: DC | PRN
  Administered 2017-03-16: 2000 mL

## 2017-03-16 MED ORDER — HEPARIN SODIUM (PORCINE) 1000 UNIT/ML DIALYSIS: 1000.0000 [IU] | INTRAMUSCULAR | Status: DC | PRN

## 2017-03-16 MED ORDER — ROCURONIUM BROMIDE 100 MG/10ML IV SOLN
INTRAVENOUS | Status: DC | PRN
  Administered 2017-03-16: 10 mg via INTRAVENOUS
  Administered 2017-03-16: 40 mg via INTRAVENOUS
  Administered 2017-03-16: 10 mg via INTRAVENOUS

## 2017-03-16 MED ORDER — PROPOFOL 10 MG/ML IV BOLUS
INTRAVENOUS | Status: AC
  Filled 2017-03-16: qty 20

## 2017-03-16 MED ORDER — LIDOCAINE-PRILOCAINE 2.5-2.5 % EX CREA: 1.0000 "application " | TOPICAL_CREAM | CUTANEOUS | Status: DC | PRN

## 2017-03-16 MED ORDER — FENTANYL CITRATE (PF) 250 MCG/5ML IJ SOLN
INTRAMUSCULAR | Status: AC
  Filled 2017-03-16: qty 5

## 2017-03-16 MED ORDER — SUGAMMADEX SODIUM 200 MG/2ML IV SOLN
INTRAVENOUS | Status: DC | PRN
  Administered 2017-03-16: 300 mg via INTRAVENOUS

## 2017-03-16 MED ORDER — PROTAMINE SULFATE 10 MG/ML IV SOLN
INTRAVENOUS | Status: DC | PRN
  Administered 2017-03-16: 20 mg via INTRAVENOUS
  Administered 2017-03-16: 15 mg via INTRAVENOUS
  Administered 2017-03-16: 5 mg via INTRAVENOUS
  Administered 2017-03-16: 10 mg via INTRAVENOUS

## 2017-03-16 MED ORDER — PENTAFLUOROPROP-TETRAFLUOROETH EX AERO: 1.0000 "application " | INHALATION_SPRAY | CUTANEOUS | Status: DC | PRN

## 2017-03-16 MED ORDER — LIDOCAINE HCL (PF) 1 % IJ SOLN: 5.0000 mL | INTRAMUSCULAR | Status: DC | PRN

## 2017-03-16 MED ORDER — HEPARIN SODIUM (PORCINE) 1000 UNIT/ML IJ SOLN
INTRAMUSCULAR | Status: DC | PRN
  Administered 2017-03-16: 9000 [IU] via INTRAVENOUS

## 2017-03-16 MED ORDER — FENTANYL CITRATE (PF) 100 MCG/2ML IJ SOLN
INTRAMUSCULAR | Status: DC | PRN
  Administered 2017-03-16 (×2): 50 ug via INTRAVENOUS

## 2017-03-16 MED ORDER — LIDOCAINE 2% (20 MG/ML) 5 ML SYRINGE
INTRAMUSCULAR | Status: AC
  Filled 2017-03-16: qty 5

## 2017-03-16 MED ORDER — HEPARIN SODIUM (PORCINE) 1000 UNIT/ML IJ SOLN
INTRAMUSCULAR | Status: AC
Start: 2017-03-16 — End: 2017-03-16
  Filled 2017-03-16: qty 1

## 2017-03-16 MED ORDER — SUGAMMADEX SODIUM 200 MG/2ML IV SOLN
INTRAVENOUS | Status: AC
  Filled 2017-03-16: qty 4

## 2017-03-16 MED ORDER — SODIUM CHLORIDE 0.9 % IV SOLN
INTRAVENOUS | Status: DC | PRN
  Administered 2017-03-16: 07:00:00 via INTRAVENOUS

## 2017-03-16 MED ORDER — MEPERIDINE HCL 25 MG/ML IJ SOLN: 6.2500 mg | INTRAMUSCULAR | Status: DC | PRN

## 2017-03-16 MED ORDER — PROPOFOL 10 MG/ML IV BOLUS
INTRAVENOUS | Status: DC | PRN
  Administered 2017-03-16: 120 mg via INTRAVENOUS

## 2017-03-16 MED ORDER — PROMETHAZINE HCL 25 MG/ML IJ SOLN: 6.2500 mg | INTRAMUSCULAR | Status: DC | PRN

## 2017-03-16 MED ORDER — SUCCINYLCHOLINE CHLORIDE 200 MG/10ML IV SOSY
PREFILLED_SYRINGE | INTRAVENOUS | Status: AC
  Filled 2017-03-16: qty 10

## 2017-03-16 MED ORDER — SODIUM CHLORIDE 0.9 % IV SOLN: 100.0000 mL | INTRAVENOUS | Status: DC | PRN

## 2017-03-16 SURGICAL SUPPLY — 56 items
BAG DECANTER FOR FLEXI CONT (MISCELLANEOUS) ×3 IMPLANT
BIOPATCH RED 1 DISK 7.0 (GAUZE/BANDAGES/DRESSINGS) ×3 IMPLANT
CANISTER SUCT 3000ML PPV (MISCELLANEOUS) ×3 IMPLANT
CANNULA VESSEL 3MM 2 BLNT TIP (CANNULA) ×3 IMPLANT
CATH PALINDROME RT-P 15FX19CM (CATHETERS) IMPLANT
CATH PALINDROME RT-P 15FX23CM (CATHETERS) ×3 IMPLANT
CATH PALINDROME RT-P 15FX28CM (CATHETERS) IMPLANT
CATH PALINDROME RT-P 15FX55CM (CATHETERS) ×3 IMPLANT
CHLORAPREP W/TINT 26ML (MISCELLANEOUS) ×3 IMPLANT
CLIP TI WIDE RED SMALL 6 (CLIP) ×3 IMPLANT
CLIP VESOCCLUDE MED 6/CT (CLIP) ×3 IMPLANT
CLIP VESOCCLUDE SM WIDE 6/CT (CLIP) ×3 IMPLANT
COVER PROBE W GEL 5X96 (DRAPES) IMPLANT
COVER SURGICAL LIGHT HANDLE (MISCELLANEOUS) ×3 IMPLANT
DERMABOND ADVANCED (GAUZE/BANDAGES/DRESSINGS) ×2
DERMABOND ADVANCED .7 DNX12 (GAUZE/BANDAGES/DRESSINGS) ×4 IMPLANT
DRAPE C-ARM 42X72 X-RAY (DRAPES) ×3 IMPLANT
DRAPE CHEST BREAST 15X10 FENES (DRAPES) ×3 IMPLANT
DRAPE INCISE IOBAN 66X45 STRL (DRAPES) ×6 IMPLANT
ELECT REM PT RETURN 9FT ADLT (ELECTROSURGICAL) ×3
ELECTRODE REM PT RTRN 9FT ADLT (ELECTROSURGICAL) ×2 IMPLANT
GAUZE SPONGE 4X4 16PLY XRAY LF (GAUZE/BANDAGES/DRESSINGS) ×3 IMPLANT
GLOVE BIO SURGEON STRL SZ7.5 (GLOVE) ×6 IMPLANT
GLOVE BIOGEL PI IND STRL 7.5 (GLOVE) ×2 IMPLANT
GLOVE BIOGEL PI IND STRL 8 (GLOVE) ×4 IMPLANT
GLOVE BIOGEL PI INDICATOR 7.5 (GLOVE) ×1
GLOVE BIOGEL PI INDICATOR 8 (GLOVE) ×2
GLOVE ECLIPSE 7.0 STRL STRAW (GLOVE) ×3 IMPLANT
GLOVE SURG SS PI 6.5 STRL IVOR (GLOVE) ×3 IMPLANT
GOWN STRL REUS W/ TWL LRG LVL3 (GOWN DISPOSABLE) ×6 IMPLANT
GOWN STRL REUS W/TWL LRG LVL3 (GOWN DISPOSABLE) ×3
GRAFT GORETEX STRT 4-7X45 (Vascular Products) ×3 IMPLANT
HEMOSTAT SPONGE AVITENE ULTRA (HEMOSTASIS) ×3 IMPLANT
KIT BASIN OR (CUSTOM PROCEDURE TRAY) ×3 IMPLANT
KIT ROOM TURNOVER OR (KITS) ×3 IMPLANT
NEEDLE 18GX1X1/2 (RX/OR ONLY) (NEEDLE) ×3 IMPLANT
NEEDLE HYPO 25GX1X1/2 BEV (NEEDLE) ×3 IMPLANT
NS IRRIG 1000ML POUR BTL (IV SOLUTION) ×3 IMPLANT
PACK CV ACCESS (CUSTOM PROCEDURE TRAY) ×3 IMPLANT
PACK SURGICAL SETUP 50X90 (CUSTOM PROCEDURE TRAY) ×3 IMPLANT
PAD ARMBOARD 7.5X6 YLW CONV (MISCELLANEOUS) ×6 IMPLANT
SPONGE SURGIFOAM ABS GEL 100 (HEMOSTASIS) IMPLANT
SUT ETHILON 3 0 PS 1 (SUTURE) ×3 IMPLANT
SUT PROLENE 6 0 BV (SUTURE) ×12 IMPLANT
SUT VIC AB 2-0 CTB1 (SUTURE) ×3 IMPLANT
SUT VIC AB 3-0 SH 27 (SUTURE) ×3
SUT VIC AB 3-0 SH 27X BRD (SUTURE) ×6 IMPLANT
SUT VICRYL 4-0 PS2 18IN ABS (SUTURE) ×9 IMPLANT
SYR 10ML LL (SYRINGE) ×3 IMPLANT
SYR 20CC LL (SYRINGE) ×6 IMPLANT
SYR 5ML LL (SYRINGE) ×6 IMPLANT
SYR CONTROL 10ML LL (SYRINGE) ×3 IMPLANT
TOWEL GREEN STERILE (TOWEL DISPOSABLE) ×3 IMPLANT
TOWEL GREEN STERILE FF (TOWEL DISPOSABLE) ×3 IMPLANT
UNDERPAD 30X30 (UNDERPADS AND DIAPERS) ×3 IMPLANT
WATER STERILE IRR 1000ML POUR (IV SOLUTION) ×3 IMPLANT

## 2017-03-16 NOTE — Transfer of Care (Signed)
Immediate Anesthesia Transfer of Care Note  Patient: Ashlee Miles  Procedure(s) Performed: Procedure(s): INSERTION OF ARTERIOVENOUS (AV) GORE-TEX GRAFT THIGH-LEFT (Left) INSERTION OF RIGHT FEMORAL TUNNELED DIALYSIS CATHETER (Right)  Patient Location: PACU  Anesthesia Type:General  Level of Consciousness: awake and patient cooperative  Airway & Oxygen Therapy: Patient Spontanous Breathing and Patient connected to face mask oxygen  Post-op Assessment: Report given to RN and Post -op Vital signs reviewed and stable  Post vital signs: Reviewed and stable  Last Vitals:  Vitals:   03/16/17 0441 03/16/17 1009  BP: (!) 154/58 (P) 130/79  Pulse: 85   Resp: 18   Temp: 36.8 C (P) 36.6 C  SpO2: 100%     Last Pain:  Vitals:   03/15/17 1140  TempSrc:   PainSc: 0-No pain         Complications: No apparent anesthesia complications

## 2017-03-16 NOTE — Interval H&P Note (Signed)
History and Physical Interval Note:  03/16/2017 7:32 AM  Ashlee Miles  has presented today for surgery, with the diagnosis of Chronic Kidney Disease Stage 5  N18.5  The various methods of treatment have been discussed with the patient and family. After consideration of risks, benefits and other options for treatment, the patient has consented to  Procedure(s): INSERTION OF ARTERIOVENOUS (AV) GORE-TEX GRAFT THIGH-LEFT (Left) INSERTION OF RIGHT FEMORAL TUNNELED DIALYSIS CATHETER (Right) as a surgical intervention .  The patient's history has been reviewed, patient examined, no change in status, stable for surgery.  I have reviewed the patient's chart and labs.  Questions were answered to the patient's satisfaction.     [name]

## 2017-03-16 NOTE — Progress Notes (Signed)
Daughter Miguel Aschoff. Arriving in 20 minutes to sign her consent.

## 2017-03-16 NOTE — Progress Notes (Signed)
Subjective:  S/p permcath and femoral graft- in pain   Objective Vital signs in last 24 hours: Vitals:   03/16/17 1030 03/16/17 1045 03/16/17 1100 03/16/17 1122  BP: (!) 138/46 (!) 143/56 125/74 99/74  Pulse: 93 99 98 98  Resp: 20 16 15 16   Temp:      TempSrc:      SpO2: 100% 95% 96% 98%  Weight:      Height:    5\' 3"  (1.6 m)   Weight change: 10.6 kg (23 lb 5.9 oz)  Intake/Output Summary (Last 24 hours) at 03/16/17 1154 Last data filed at 03/16/17 1030  Gross per 24 hour  Intake             1360 ml  Output             1770 ml  Net             -410 ml    Assessment/Plan: 76 year old black female with advanced CKD and now what appears to be uremic symptoms 1.Renal- patient with pretty advanced CKD at baseline.  From hospitalization last week to this week kidney function has worsened and now GFR of around 10 ml/min. It seems that along with this is, she has had failure to thrive which I suspect may be uremia related.  I spoke to the patient and her daughter [name] who is medical power of attorney and we were at the agreement that  dialysis should be initiated in order to see if her clinical situation improves. there is an issue with vascular access- she will need a thigh graft and a femoral catheter- done today.  Plan is for initiation of HD- pt and daughter accepting.  For first treatment later today   2. Hypertension/volume  -  have stopped IVF- I think was just diluting her blood as no UOP recorded, although now is some 3. Pyuria- patient may have UTI- urine culture just showed many species 4. Anemia  - hemoglobin 9- iron stores low- replete iron and start ESA  5. Bones- PTH 623- phos OK- started calcitriol  6. Hyperkalemia- medical treatment for now- added veltassa - better today will stop    Thatcher Doberstein A    Labs: Basic Metabolic Panel:  Recent Labs Lab 03/14/17 1036 03/15/17 0449 03/16/17 0416  NA 145 147* 145  K 5.4* 5.1 4.7  CL 119* 115* 117*  CO2 22 23  23   GLUCOSE 91 84 103*  BUN 51* 45* 39*  CREATININE 4.35* 4.04* 3.54*  CALCIUM 7.8* 7.8* 8.0*  PHOS 4.8* 4.0 4.1   Liver Function Tests:  Recent Labs Lab 03/14/17 1036 03/15/17 0449 03/16/17 0416  ALBUMIN 2.5* 2.5* 2.2*   No results for input(s): LIPASE, AMYLASE in the last 168 hours. No results for input(s): AMMONIA in the last 168 hours. CBC:  Recent Labs Lab 03/12/17 1800 03/14/17 1036 03/15/17 0449  WBC 12.1* 5.9 7.8  NEUTROABS 9.2*  --   --   HGB 9.0* 9.6* 9.1*  HCT 29.3* 30.7* 29.8*  MCV 78.8 78.9 78.8  PLT 163 164 169   Cardiac Enzymes:  Recent Labs Lab 03/12/17 1800  TROPONINI <0.03   CBG:  Recent Labs Lab 03/15/17 2356 03/16/17 0445 03/16/17 0641 03/16/17 1029 03/16/17 1138  GLUCAP 89 101* 102* 128* 143*    Iron Studies:   Recent Labs  03/14/17 1036  IRON 32  TIBC 169*  FERRITIN 364*   Studies/Results: Dg Abd Portable 1v  Result Date: 03/16/2017 CLINICAL DATA:  Post femoral catheter placement, diabetes mellitus, hypertension, CHF EXAM: PORTABLE ABDOMEN - 1 VIEW COMPARISON:  Portable exam 1007 hours without priors for comparison FINDINGS: RIGHT femoral line with tip projecting over expected position of the RIGHT common iliac vein. IVC filter noted. Nonobstructive bowel gas pattern with prominent stool throughout colon. Surgical clips RIGHT upper quadrant question cholecystectomy. Bones demineralized. IMPRESSION: Tip of RIGHT femoral line projects over expected position of RIGHT common iliac vein. Increased stool in colon. Electronically Signed   By: Lavonia Dana M.D.   On: 03/16/2017 10:24   Dg Fluoro Guide Cv Line-no Report  Result Date: 03/16/2017 Fluoroscopy was utilized by the requesting physician.  No radiographic interpretation.   Medications: Infusions: . sodium chloride    . sodium chloride    . ferric gluconate (FERRLECIT/NULECIT) IV Stopped (03/15/17 1159)    Scheduled Medications: . aspirin  325 mg Oral Daily  .  calcitRIOL  0.5 mcg Oral Daily  . heparin  5,000 Units Subcutaneous Q8H  . insulin aspart  0-9 Units Subcutaneous Q4H  . methocarbamol  500 mg Oral QID  . pantoprazole  40 mg Oral Daily  . patiromer  8.4 g Oral Daily  . senna-docusate  1 tablet Oral QHS  . simvastatin  40 mg Oral q1800    have reviewed scheduled and prn medications.  Physical Exam: General: Obese, post op so in pain  Heart: Regular rate and rhythm Lungs: Poor effort, decreased breath sounds at the bases Abdomen: Obese, soft, nontender Extremities: Dependent pitting edema Dialysis Access: right femoral pc and left AVG- difficult to appreciate bruit    03/16/2017,11:54 AM  LOS: 4 days

## 2017-03-16 NOTE — Progress Notes (Signed)
PT Cancellation Note  Patient Details Name: Ashlee Miles MRN: [id number] DOB: 18-Mar-1941   Cancelled Treatment:    Reason Eval/Treat Not Completed: Patient at procedure or test/unavailable Pt off unit for R femoral HD cath. PT will check on pt later as time allows.    Salina April, PTA Pager: [phone]   03/16/2017, 10:32 AM

## 2017-03-16 NOTE — Progress Notes (Addendum)
PROGRESS NOTE    Ashlee Miles  QQI:[id number] DOB: 12/23/1940 DOA: 03/12/2017 PCP: Darlina Rumpf, MD   Brief Narrative: 76 year old female with history of hypertension, type 2 diabetes, chronic back pain, chronic kidney disease stage IV presented with episode of syncope at home. Patient also with worsening mental status over last 1 week. Patient was hypoglycemic on admission. Worsening renal failure with hyperkalemia. Admitted for further evaluation. Patient was recently admitted for TIA workup.  Assessment & Plan:  # Acute encephalopathy likely uremic/metabolic in the setting of renal failure and multiple pain medications. -Patient is feeling weak however mental status is improving. -CT head with no acute finding -Holding sedatives -PT OT evaluation  #Acute kidney injury on chronic kidney disease is stage IV versus progressive security stage IV with uremic symptoms: Ultrasound of kidneys with no hydronephrosis or acute finding.  -Planned to initiate hemodialysis today for advance CKD with uremic symptoms.  -Status post right femoral tunneled catheter placement and placement of left thigh AVG by vascular. First dialysis today. Discussed with the patient's daughters at bedside.  #Anemia of chronic kidney disease: Iron and he is seated in dialysis. Monitor CBC. No sign of bleeding.  #Type 2 diabetes with hypoglycemia on admission: Patient has A1c of 7.2 on 03/02/2017. -Currently on sliding scale. Monitor blood sugar level  #History of TIA: Continue aspirin, zocor  #Hypertension: Blood pressure acceptable. Monitor blood pressure closely.  #Chronic diastolic congestive heart failure: Continue cardiac medication  # Chronic back pain: Continue Robaxin for muscle relaxant. Holding sedatives.  DVT prophylaxis: Heparin subcutaneous Code Status: DO NOT RESUSCITATE Family Communication: I discussed with 2 daughters at bedside. Disposition Plan: Currently admitted, likely discharge to  skilled nursing facility when bed is available and after outpatient dialysis center.    Consultants:   Nephrologist  Vascular surgery  Procedures: None Antimicrobials: None  Subjective: Seen and examined at bedside. Denied headache, dizziness, nausea vomiting chest pain shortness of breath. Feels weak. Objective: Vitals:   03/16/17 1030 03/16/17 1045 03/16/17 1100 03/16/17 1122  BP: (!) 138/46 (!) 143/56 125/74 99/74  Pulse: 93 99 98 98  Resp: 20 16 15 16   Temp:      TempSrc:      SpO2: 100% 95% 96% 98%  Weight:      Height:    5\' 3"  (1.6 m)    Intake/Output Summary (Last 24 hours) at 03/16/17 1311 Last data filed at 03/16/17 1030  Gross per 24 hour  Intake             1360 ml  Output             1770 ml  Net             -410 ml   Filed Weights   03/13/17 2020 03/14/17 2007 03/15/17 2045  Weight: 103.2 kg (227 lb 8.2 oz) 103.4 kg (227 lb 15.3 oz) 114 kg (251 lb 5.2 oz)    Examination:  General exam: Lying on bed, not in distress Respiratory system: Bibasal decreased breath sound, respiratory effort normal. No wheezing Cardiovascular system: Regular rate rhythm, S1-S2 normal. Trace lower extremity edema. Gastrointestinal system: Abdomen soft, nontender. Bowel sound positive Central nervous system: Alert awake and oriented 3. Skin: No rashes, lesions or ulcers Right femoral tunneled catheter and left thigh graft. Data Reviewed: I have personally reviewed following labs and imaging studies  CBC:  Recent Labs Lab 03/12/17 1800 03/14/17 1036 03/15/17 0449  WBC 12.1* 5.9 7.8  NEUTROABS 9.2*  --   --  HGB 9.0* 9.6* 9.1*  HCT 29.3* 30.7* 29.8*  MCV 78.8 78.9 78.8  PLT 163 164 177   Basic Metabolic Panel:  Recent Labs Lab 03/13/17 0510 03/13/17 1559 03/14/17 1036 03/15/17 0449 03/16/17 0416  NA 143 145 145 147* 145  K 5.9* 5.3* 5.4* 5.1 4.7  CL 115* 118* 119* 115* 117*  CO2 22 20* 22 23 23   GLUCOSE 73 48* 91 84 103*  BUN 65* 61* 51* 45* 39*    CREATININE 5.16* 5.00* 4.35* 4.04* 3.54*  CALCIUM 8.0* 7.9*  7.8* 7.8* 7.8* 8.0*  PHOS  --   --  4.8* 4.0 4.1   GFR: Estimated Creatinine Clearance: 16.7 mL/min (A) (by C-G formula based on SCr of 3.54 mg/dL (H)). Liver Function Tests:  Recent Labs Lab 03/14/17 1036 03/15/17 0449 03/16/17 0416  ALBUMIN 2.5* 2.5* 2.2*   No results for input(s): LIPASE, AMYLASE in the last 168 hours. No results for input(s): AMMONIA in the last 168 hours. Coagulation Profile: No results for input(s): INR, PROTIME in the last 168 hours. Cardiac Enzymes:  Recent Labs Lab 03/12/17 1800  TROPONINI <0.03   BNP (last 3 results) No results for input(s): PROBNP in the last 8760 hours. HbA1C:  Recent Labs  03/14/17 1036  HGBA1C 7.3*   CBG:  Recent Labs Lab 03/15/17 2356 03/16/17 0445 03/16/17 0641 03/16/17 1029 03/16/17 1138  GLUCAP 89 101* 102* 128* 143*   Lipid Profile: No results for input(s): CHOL, HDL, LDLCALC, TRIG, CHOLHDL, LDLDIRECT in the last 72 hours. Thyroid Function Tests: No results for input(s): TSH, T4TOTAL, FREET4, T3FREE, THYROIDAB in the last 72 hours. Anemia Panel:  Recent Labs  03/14/17 1036  FERRITIN 364*  TIBC 169*  IRON 32   Sepsis Labs: No results for input(s): PROCALCITON, LATICACIDVEN in the last 168 hours.  Recent Results (from the past 240 hour(s))  Urine Culture     Status: Abnormal   Collection Time: 03/13/17  4:05 PM  Result Value Ref Range Status   Specimen Description URINE, RANDOM  Final   Special Requests NONE  Final   Culture MULTIPLE SPECIES PRESENT, SUGGEST RECOLLECTION (A)  Final   Report Status 03/15/2017 FINAL  Final  Surgical pcr screen     Status: None   Collection Time: 03/15/17 10:39 PM  Result Value Ref Range Status   MRSA, PCR NEGATIVE NEGATIVE Final   Staphylococcus aureus NEGATIVE NEGATIVE Final    Comment: (NOTE) The Xpert SA Assay (FDA approved for NASAL specimens in patients 36 years of age and older), is one  component of a comprehensive surveillance program. It is not intended to diagnose infection nor to guide or monitor treatment.          Radiology Studies: Dg Abd Portable 1v  Result Date: 03/16/2017 CLINICAL DATA:  Post femoral catheter placement, diabetes mellitus, hypertension, CHF EXAM: PORTABLE ABDOMEN - 1 VIEW COMPARISON:  Portable exam 1007 hours without priors for comparison FINDINGS: RIGHT femoral line with tip projecting over expected position of the RIGHT common iliac vein. IVC filter noted. Nonobstructive bowel gas pattern with prominent stool throughout colon. Surgical clips RIGHT upper quadrant question cholecystectomy. Bones demineralized. IMPRESSION: Tip of RIGHT femoral line projects over expected position of RIGHT common iliac vein. Increased stool in colon. Electronically Signed   By: Lavonia Dana M.D.   On: 03/16/2017 10:24   Dg Fluoro Guide Cv Line-no Report  Result Date: 03/16/2017 Fluoroscopy was utilized by the requesting physician.  No radiographic interpretation.  Scheduled Meds: . aspirin  325 mg Oral Daily  . calcitRIOL  0.5 mcg Oral Daily  . darbepoetin (ARANESP) injection - DIALYSIS  60 mcg Intravenous Q Thu-HD  . heparin  5,000 Units Subcutaneous Q8H  . insulin aspart  0-9 Units Subcutaneous Q4H  . methocarbamol  500 mg Oral QID  . pantoprazole  40 mg Oral Daily  . senna-docusate  1 tablet Oral QHS  . simvastatin  40 mg Oral q1800   Continuous Infusions: . sodium chloride    . sodium chloride    . ferric gluconate (FERRLECIT/NULECIT) IV Stopped (03/15/17 1159)     LOS: 4 days    Dron Tanna Furry, MD [facility] Pager [phone]  If 7PM-7AM, please contact night-coverage [email or web address] Password [email or web address] 03/16/2017, 1:11 PM

## 2017-03-16 NOTE — Anesthesia Procedure Notes (Signed)
Procedure Name: Intubation Date/Time: 03/16/2017 7:57 AM Performed by: Orlie Dakin Pre-anesthesia Checklist: Patient identified, Emergency Drugs available, Suction available, Patient being monitored and Timeout performed Patient Re-evaluated:Patient Re-evaluated prior to induction Oxygen Delivery Method: Circle system utilized Preoxygenation: Pre-oxygenation with 100% oxygen Induction Type: IV induction Ventilation: Mask ventilation without difficulty Laryngoscope Size: Miller and 3 Grade View: Grade II Tube type: Oral Tube size: 7.0 mm Number of attempts: 1 Airway Equipment and Method: Stylet Placement Confirmation: ETT inserted through vocal cords under direct vision,  positive ETCO2 and breath sounds checked- equal and bilateral Secured at: 23 cm Tube secured with: Tape Dental Injury: Teeth and Oropharynx as per pre-operative assessment  Comments: Poor dentition pre-op noted, some missing upper, chipped right incisor, none loose per patient report.  4x4s bite block used.

## 2017-03-16 NOTE — Op Note (Signed)
    NAME: Ashlee Miles    MRN: [id number] DOB: [birth year]    DATE OF OPERATION: 03/16/2017  PREOP DIAGNOSIS:    Stage 5 CKD  POSTOP DIAGNOSIS:    Same  PROCEDURE:    1. Ultrasound guided placement of right femoral tunneled dialysis catheter. 2. Placement of left thigh AVG  SURGEON: Judeth Cornfield. Scot Dock, MD, FACS  ASSIST: Benjamine Sprague, PA  ANESTHESIA: Gen.   EBL: min  INDICATIONS:    Ashlee Miles is a 76 y.o. female who presents for new access.  FINDINGS:    IVC filter so cath placed in Right CIV  TECHNIQUE:    The patient was taken to the operating room and received a general anesthetic. Both groins are prepped and draped in the usual sterile fashion. Under ultrasound guidance, after the skin was anesthetized, the right common femoral vein was cannulated and a guidewire introduced into the inferior vena cava under fluoroscopic control. It was an IVC filter in place and therefore I selected a 23 cm tunneled dialysis catheter. The tract over the wire was dilated and then the dilator and peel-away sheath were advanced over the wire and the wire and dilator removed. The cath was passed through the peel-away sheath and positioned in the right common iliac vein. The exit site of the cath was selected and the catheter brought through the tunnel. Cath was cut to the appropriate length and the distal ports were attached. Both ports withdrew easily and flushed with heparin saline and filled with concentrated. The catheter was secured at its exit site with a 3-0 nylon suture. The femoral cannulation site was closed with 40 subcutaneous stitch. Attention was then turned  Attention was then turned to the left thigh. An oblique incision was made in the left groin and through this incision the common femoral artery was dissected free and controlled with a vessel loop. A separate incision was made over the saphenous vein in the medial proximal thigh where the saphenous vein was dissected  free and controlled. Using one distal counterincision, a 4-7 mm PTFE graft was tunneled in a loop fashion in the thigh and the patient was heparinized. The common femoral artery was clamped proximally and distally and a longitudinal arteriotomy was made. A segment of the 4 mm and the graft was excised, the graft slightly spatulated, and sewn end to side to the common femoral artery using continuous 6-0 Prolene suture. The graft was important properly length for anastomosis to the saphenous vein. The saphenous vein was ligated distally and spatulated proximally. The graft was cut the appropriate length, spatulated, and sewn end to end to the vein using 2 continuous 6-0 Prolene sutures. At the completion was an excellent thrill in the fistula. Hemostasis was obtained in the wounds.    the groin incision was closed with a deep 2-0 Vicryl, a subcutaneous layer of 2-0 Vicryl and the skin closed with 4-0 Vicryl. This incision over the saphenous vein was closed with 2 deep layers of 3-0 Vicryl and the skin closed with 4-0 Vicryl. The counterincision was closed with the peritoneal Vicryl the skin closed with 4-0 Vicryl.  Dermabond was applied. The patient tolerated the procedure well and transferred to the recovery room in stable condition. All needle and sponge counts were correct.   Ashlee Mayo, MD, FACS [facility]  DATE OF DICTATION:   03/16/2017

## 2017-03-16 NOTE — H&P (View-Only) (Signed)
   VASCULAR SURGERY ASSESSMENT & PLAN:   I tentatively have her scheduled for placement of a right femoral tunneled dialysis catheter and a left thigh AV graft tomorrow, if she and her family are agreeable. I discussed the case with Dr. Moshe Cipro who will also discussed this with the family. I have discussed this again with the patient this morning. We have discussed indications for surgery and potential complications. She is not sure whether or not she wants to proceed and would like to speak to her family. We'll  SUBJECTIVE:   No specific complaints this morning.  PHYSICAL EXAM:   Vitals:   03/14/17 0815 03/14/17 1811 03/14/17 2007 03/15/17 0447  BP: 131/60 (!) 136/41 129/69 118/73  Pulse: 92 85 82 88  Resp: 18 18 19 20   Temp: 97.9 F (36.6 C) 97.7 F (36.5 C) 98.4 F (36.9 C) 98.4 F (36.9 C)  TempSrc: Oral Oral Oral Oral  SpO2: 100% 100% 100% 100%  Weight:   227 lb 15.3 oz (103.4 kg)    Lungs are clear. Palpable dorsalis pedis pulses.  LABS:   Lab Results  Component Value Date   WBC 7.8 03/15/2017   HGB 9.1 (L) 03/15/2017   HCT 29.8 (L) 03/15/2017   MCV 78.8 03/15/2017   PLT 169 03/15/2017   Lab Results  Component Value Date   CREATININE 4.04 (H) 03/15/2017   Lab Results  Component Value Date   INR 1.02 03/01/2017   CBG (last 3)   Recent Labs  03/14/17 2346 03/15/17 0441 03/15/17 0530  GLUCAP 141* 68 103*    PROBLEM LIST:    Principal Problem:   Acute renal failure superimposed on stage 4 chronic kidney disease ([place]) Active Problems:   CKD (chronic kidney disease) stage 4, GFR 15-29 ml/min ([facility])   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 aspirin  325 mg Oral Daily  . calcitRIOL  0.5 mcg Oral Daily  . heparin  5,000 Units Subcutaneous Q8H    . insulin aspart  0-9 Units Subcutaneous Q4H  . methocarbamol  500 mg Oral QID  . pantoprazole  40 mg Oral Daily  . patiromer  8.4 g Oral Daily  . senna-docusate  1 tablet Oral QHS  . simvastatin  40 mg Oral q1800    Gae Gallop Beeper: [phone] Office: [phone] 03/15/2017

## 2017-03-17 ENCOUNTER — Encounter (HOSPITAL_COMMUNITY): Payer: Self-pay | Admitting: Vascular Surgery

## 2017-03-17 LAB — RENAL FUNCTION PANEL
Albumin: 2.2 g/dL — ABNORMAL LOW (ref 3.5–5.0)
Albumin: 2.3 g/dL — ABNORMAL LOW (ref 3.5–5.0)
Anion gap: 3 — ABNORMAL LOW (ref 5–15)
Anion gap: 7 (ref 5–15)
BUN: 25 mg/dL — ABNORMAL HIGH (ref 6–20)
BUN: 27 mg/dL — ABNORMAL HIGH (ref 6–20)
CO2: 28 mmol/L (ref 22–32)
CO2: 23 mmol/L (ref 22–32)
Calcium: 8 mg/dL — ABNORMAL LOW (ref 8.9–10.3)
Calcium: 8.2 mg/dL — ABNORMAL LOW (ref 8.9–10.3)
Chloride: 112 mmol/L — ABNORMAL HIGH (ref 101–111)
Chloride: 108 mmol/L (ref 101–111)
Creatinine, Ser: 2.84 mg/dL — ABNORMAL HIGH (ref 0.44–1.00)
Creatinine, Ser: 3.09 mg/dL — ABNORMAL HIGH (ref 0.44–1.00)
GFR calc Af Amer: 18 mL/min — ABNORMAL LOW (ref >60)
GFR calc Af Amer: 16 mL/min — ABNORMAL LOW (ref >60)
GFR calc non Af Amer: 15 mL/min — ABNORMAL LOW (ref >60)
GFR calc non Af Amer: 14 mL/min — ABNORMAL LOW (ref >60)
Glucose, Bld: 174 mg/dL — ABNORMAL HIGH (ref 65–99)
Glucose, Bld: 240 mg/dL — ABNORMAL HIGH (ref 65–99)
Phosphorus: 3.5 mg/dL (ref 2.5–4.6)
Phosphorus: 3 mg/dL (ref 2.5–4.6)
Potassium: 4.3 mmol/L (ref 3.5–5.1)
Potassium: 4.4 mmol/L (ref 3.5–5.1)
Sodium: 143 mmol/L (ref 135–145)
Sodium: 138 mmol/L (ref 135–145)

## 2017-03-17 LAB — GLUCOSE, CAPILLARY
Glucose-Capillary: 178 mg/dL — ABNORMAL HIGH (ref 65–99)
Glucose-Capillary: 157 mg/dL — ABNORMAL HIGH (ref 65–99)
Glucose-Capillary: 207 mg/dL — ABNORMAL HIGH (ref 65–99)
Glucose-Capillary: 211 mg/dL — ABNORMAL HIGH (ref 65–99)
Glucose-Capillary: 115 mg/dL — ABNORMAL HIGH (ref 65–99)

## 2017-03-17 LAB — HEPATITIS B SURFACE ANTIGEN: Hepatitis B Surface Ag: NEGATIVE

## 2017-03-17 LAB — CBC
HCT: 26 % — ABNORMAL LOW (ref 36.0–46.0)
Hemoglobin: 8.4 g/dL — ABNORMAL LOW (ref 12.0–15.0)
MCH: 25.1 pg — ABNORMAL LOW (ref 26.0–34.0)
MCHC: 32.3 g/dL (ref 30.0–36.0)
MCV: 77.6 fL — ABNORMAL LOW (ref 78.0–100.0)
Platelets: 113 10*3/uL — ABNORMAL LOW (ref 150–400)
RBC: 3.35 MIL/uL — ABNORMAL LOW (ref 3.87–5.11)
RDW: 16 % — ABNORMAL HIGH (ref 11.5–15.5)
WBC: 8.4 10*3/uL (ref 4.0–10.5)

## 2017-03-17 LAB — HEPATITIS B SURFACE ANTIBODY,QUALITATIVE: Hep B S Ab: NONREACTIVE

## 2017-03-17 LAB — HEPATITIS B CORE ANTIBODY, TOTAL: Hep B Core Total Ab: NEGATIVE

## 2017-03-17 MED ORDER — INSULIN GLARGINE 100 UNIT/ML ~~LOC~~ SOLN
6.0000 [IU] | Freq: Every day | SUBCUTANEOUS | Status: DC
  Administered 2017-03-18 – 2017-03-22 (×6): 6 [IU] via SUBCUTANEOUS
  Filled 2017-03-17 (×7): qty 0.06

## 2017-03-17 MED ORDER — DARBEPOETIN ALFA 60 MCG/0.3ML IJ SOSY
60.0000 ug | PREFILLED_SYRINGE | INTRAMUSCULAR | Status: DC
  Administered 2017-03-18: 60 ug via INTRAVENOUS
  Filled 2017-03-17: qty 0.3

## 2017-03-17 NOTE — Care Management Important Message (Signed)
Important Message  Patient Details  Name: Ashlee Miles MRN: [id number] Date of Birth: 17-Jan-1941   Medicare Important Message Given:  Yes    Nathen May 03/17/2017, 11:43 AM

## 2017-03-17 NOTE — Progress Notes (Signed)
   VASCULAR SURGERY ASSESSMENT & PLAN:   1 Day Post-Op s/p: right fem TDC and left thigh AVG  Graft is working well  Did well on HD yesterday  I have arranged f/u as outpt  Vascular will be available as needed.    SUBJECTIVE:   No complaints  PHYSICAL EXAM:   Vitals:   03/16/17 1600 03/16/17 1759 03/16/17 2026 03/17/17 0351  BP: (!) 141/67 (!) 148/74 (!) 162/53 (!) 173/55  Pulse: 92 96 91 89  Resp: 18 18 (!) 21   Temp: 99.5 F (37.5 C) 97.8 F (36.6 C) 98.5 F (36.9 C) 98.2 F (36.8 C)  TempSrc: Oral Oral    SpO2: 98% 100% 98% 100%  Weight: 251 lb 5.2 oz (114 kg)  255 lb 11.7 oz (116 kg)   Height:       Graft with good thrill Left foot warm  LABS:   Lab Results  Component Value Date   WBC 7.8 03/15/2017   HGB 9.1 (L) 03/15/2017   HCT 29.8 (L) 03/15/2017   MCV 78.8 03/15/2017   PLT 169 03/15/2017   Lab Results  Component Value Date   CREATININE 2.84 (H) 03/17/2017   Lab Results  Component Value Date   INR 1.02 03/01/2017   CBG (last 3)   Recent Labs  03/16/17 2025 03/16/17 2352 03/17/17 0350  GLUCAP 173* 222* 178*    PROBLEM LIST:    Principal Problem:   Acute renal failure superimposed on stage 4 chronic kidney disease (HCC) Active Problems:   CKD (chronic kidney disease) stage 4, GFR 15-29 ml/min (HCC)   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 aspirin  325 mg Oral Daily  . calcitRIOL  0.5 mcg Oral Daily  . darbepoetin (ARANESP) injection - DIALYSIS  60 mcg Intravenous Q Thu-HD  . heparin  5,000 Units Subcutaneous Q8H  . insulin aspart  0-9 Units Subcutaneous Q4H  . methocarbamol  500 mg Oral QID  . pantoprazole  40 mg Oral Daily  . senna-docusate  1 tablet Oral QHS  . simvastatin  40 mg Oral q1800    Gae Gallop Beeper:  [phone] Office: [phone] 03/17/2017

## 2017-03-17 NOTE — Anesthesia Postprocedure Evaluation (Signed)
Anesthesia Post Note  Patient: Ashlee Miles  Procedure(s) Performed: Procedure(s) (LRB): INSERTION OF ARTERIOVENOUS (AV) GORE-TEX GRAFT THIGH-LEFT (Left) INSERTION OF RIGHT FEMORAL TUNNELED DIALYSIS CATHETER (Right)     Patient location during evaluation: PACU Anesthesia Type: General Level of consciousness: sedated and patient cooperative Pain management: pain level controlled Vital Signs Assessment: post-procedure vital signs reviewed and stable Respiratory status: spontaneous breathing Cardiovascular status: stable Anesthetic complications: no    Last Vitals:  Vitals:   03/16/17 2026 03/17/17 0351  BP: (!) 162/53 (!) 173/55  Pulse: 91 89  Resp: (!) 21   Temp: 36.9 C 36.8 C  SpO2: 98% 100%    Last Pain:  Vitals:   03/16/17 1759  TempSrc: Oral  PainSc:                  Nolon Nations

## 2017-03-17 NOTE — Progress Notes (Signed)
Patient with some chest discomfort. Positive for cough for a couple days.  Vital signs stable. New Hemodialysis patient. Going to hemodialysis now. Tylenol given. MD paged.

## 2017-03-17 NOTE — Progress Notes (Signed)
HD tx completed w/o problem, UF goal met, blood rinsed back, VSS, report called to Jennifer Nelsonsmith, RN 

## 2017-03-17 NOTE — Progress Notes (Signed)
HD tx initiated via HD cath w/o problem, pull/push/flush equally w/o problem, VSS, will cont to monitor while on HD tx 

## 2017-03-17 NOTE — Progress Notes (Signed)
PROGRESS NOTE    Ashlee Miles  NGE:[id number] DOB: March 03, 1941 DOA: 03/12/2017 PCP: Darlina Rumpf, MD   Brief Narrative: 76 year old female with history of hypertension, type 2 diabetes, chronic back pain, chronic kidney disease stage IV presented with episode of syncope at home. Patient also with worsening mental status over last 1 week. Patient was hypoglycemic on admission. Worsening renal failure with hyperkalemia. Admitted for further evaluation. Patient was recently admitted for TIA workup.  Assessment & Plan:  # Acute encephalopathy likely uremic/metabolic in the setting of renal failure and multiple pain medications. -Mental status improved. -CT head with no acute finding -PT OT evaluation recommended skilled nursing facility. Social worker evaluation ongoing.  #Acute kidney injury on chronic kidney disease stage IV versus progressive CKD IV with uremic symptoms: - Ultrasound of kidneys with no hydronephrosis or acute finding.  -Status post right femoral tunneled catheter placement and placement of left thigh AVG by vascular on 9/27 and initiated dialysis. -Outpatient hemodialysis center set up  #Anemia of chronic kidney disease: Iron and ESA as per nephrology. Monitor CBC.  #Type 2 diabetes with hypoglycemia on admission: Patient has A1c of 7.2 on 03/02/2017. -Blood sugar level is now elevated. He started low-dose Lantus. Continue sliding scale.  #History of TIA: Continue aspirin, zocor  #Hypertension: Blood pressure elevated. Initiated hemodialysis. Monitor blood pressure closely.  #Chronic diastolic congestive heart failure: Continue cardiac medication  # Chronic back pain: Continue Robaxin for muscle relaxant. Holding sedatives.  DVT prophylaxis: Heparin subcutaneous Code Status: DO NOT RESUSCITATE Family Communication: I discussed with 2 daughters at bedside yesterday. Disposition Plan: Currently admitted, likely discharge to skilled nursing facility when bed is  available and after outpatient dialysis center. Discussed with the case manager today    Consultants:   Nephrologist  Vascular surgery  Procedures: None Antimicrobials: None  Subjective: Seen and examined at bedside. Denied headache, dizziness, nausea vomiting chest pain or shortness of breath.. Objective: Vitals:   03/16/17 1759 03/16/17 2026 03/17/17 0351 03/17/17 0942  BP: (!) 148/74 (!) 162/53 (!) 173/55 (!) 166/62  Pulse: 96 91 89 89  Resp: 18 (!) 21    Temp: 97.8 F (36.6 C) 98.5 F (36.9 C) 98.2 F (36.8 C) 98.6 F (37 C)  TempSrc: Oral   Oral  SpO2: 100% 98% 100% 97%  Weight:  116 kg (255 lb 11.7 oz)    Height:        Intake/Output Summary (Last 24 hours) at 03/17/17 1153 Last data filed at 03/17/17 0943  Gross per 24 hour  Intake              360 ml  Output              900 ml  Net             -540 ml   Filed Weights   03/16/17 1349 03/16/17 1600 03/16/17 2026  Weight: 114.5 kg (252 lb 6.8 oz) 114 kg (251 lb 5.2 oz) 116 kg (255 lb 11.7 oz)    Examination:  General exam: Not in distress Respiratory system: Bibasal decreased breath sound, no wheezing Cardiovascular system: Regular rate rhythm, S1-S2 normal. No lower estimated edema. Gastrointestinal system: Abdomen soft, nontender. Bowel sound positive Central nervous system: Alert awake and oriented 3. Nonfocal Skin: No rashes, lesions or ulcers Right femoral tunneled catheter and left thigh graft. Unchanged Data Reviewed: I have personally reviewed following labs and imaging studies  CBC:  Recent Labs Lab 03/12/17 1800 03/14/17 1036 03/15/17 0449  WBC 12.1* 5.9 7.8  NEUTROABS 9.2*  --   --   HGB 9.0* 9.6* 9.1*  HCT 29.3* 30.7* 29.8*  MCV 78.8 78.9 78.8  PLT 163 164 194   Basic Metabolic Panel:  Recent Labs Lab 03/13/17 1559 03/14/17 1036 03/15/17 0449 03/16/17 0416 03/17/17 0357  NA 145 145 147* 145 143  K 5.3* 5.4* 5.1 4.7 4.3  CL 118* 119* 115* 117* 112*  CO2 20* 22 23 23 28    GLUCOSE 48* 91 84 103* 174*  BUN 61* 51* 45* 39* 25*  CREATININE 5.00* 4.35* 4.04* 3.54* 2.84*  CALCIUM 7.9*  7.8* 7.8* 7.8* 8.0* 8.0*  PHOS  --  4.8* 4.0 4.1 3.5   GFR: Estimated Creatinine Clearance: 21 mL/min (A) (by C-G formula based on SCr of 2.84 mg/dL (H)). Liver Function Tests:  Recent Labs Lab 03/14/17 1036 03/15/17 0449 03/16/17 0416 03/17/17 0357  ALBUMIN 2.5* 2.5* 2.2* 2.2*   No results for input(s): LIPASE, AMYLASE in the last 168 hours. No results for input(s): AMMONIA in the last 168 hours. Coagulation Profile: No results for input(s): INR, PROTIME in the last 168 hours. Cardiac Enzymes:  Recent Labs Lab 03/12/17 1800  TROPONINI <0.03   BNP (last 3 results) No results for input(s): PROBNP in the last 8760 hours. HbA1C: No results for input(s): HGBA1C in the last 72 hours. CBG:  Recent Labs Lab 03/16/17 2025 03/16/17 2352 03/17/17 0350 03/17/17 0747 03/17/17 1148  GLUCAP 173* 222* 178* 157* 207*   Lipid Profile: No results for input(s): CHOL, HDL, LDLCALC, TRIG, CHOLHDL, LDLDIRECT in the last 72 hours. Thyroid Function Tests: No results for input(s): TSH, T4TOTAL, FREET4, T3FREE, THYROIDAB in the last 72 hours. Anemia Panel: No results for input(s): VITAMINB12, FOLATE, FERRITIN, TIBC, IRON, RETICCTPCT in the last 72 hours. Sepsis Labs: No results for input(s): PROCALCITON, LATICACIDVEN in the last 168 hours.  Recent Results (from the past 240 hour(s))  Urine Culture     Status: Abnormal   Collection Time: 03/13/17  4:05 PM  Result Value Ref Range Status   Specimen Description URINE, RANDOM  Final   Special Requests NONE  Final   Culture MULTIPLE SPECIES PRESENT, SUGGEST RECOLLECTION (A)  Final   Report Status 03/15/2017 FINAL  Final  Surgical pcr screen     Status: None   Collection Time: 03/15/17 10:39 PM  Result Value Ref Range Status   MRSA, PCR NEGATIVE NEGATIVE Final   Staphylococcus aureus NEGATIVE NEGATIVE Final    Comment:  (NOTE) The Xpert SA Assay (FDA approved for NASAL specimens in patients 32 years of age and older), is one component of a comprehensive surveillance program. It is not intended to diagnose infection nor to guide or monitor treatment.          Radiology Studies: Dg Abd Portable 1v  Result Date: 03/16/2017 CLINICAL DATA:  Post femoral catheter placement, diabetes mellitus, hypertension, CHF EXAM: PORTABLE ABDOMEN - 1 VIEW COMPARISON:  Portable exam 1007 hours without priors for comparison FINDINGS: RIGHT femoral line with tip projecting over expected position of the RIGHT common iliac vein. IVC filter noted. Nonobstructive bowel gas pattern with prominent stool throughout colon. Surgical clips RIGHT upper quadrant question cholecystectomy. Bones demineralized. IMPRESSION: Tip of RIGHT femoral line projects over expected position of RIGHT common iliac vein. Increased stool in colon. Electronically Signed   By: Lavonia Dana M.D.   On: 03/16/2017 10:24   Dg Fluoro Guide Cv Line-no Report  Result Date: 03/16/2017 Fluoroscopy was utilized by  the requesting physician.  No radiographic interpretation.        Scheduled Meds: . aspirin  325 mg Oral Daily  . calcitRIOL  0.5 mcg Oral Daily  . darbepoetin (ARANESP) injection - DIALYSIS  60 mcg Intravenous Q Thu-HD  . heparin  5,000 Units Subcutaneous Q8H  . insulin aspart  0-9 Units Subcutaneous Q4H  . methocarbamol  500 mg Oral QID  . pantoprazole  40 mg Oral Daily  . senna-docusate  1 tablet Oral QHS  . simvastatin  40 mg Oral q1800   Continuous Infusions: . sodium chloride    . sodium chloride    . ferric gluconate (FERRLECIT/NULECIT) IV Stopped (03/15/17 1159)     LOS: 5 days    Nole Robey Tanna Furry, MD [facility] Pager [phone]  If 7PM-7AM, please contact night-coverage [email or web address] Password [facility] 03/17/2017, 11:53 AM
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
## 2017-03-17 NOTE — Progress Notes (Signed)
Subjective:  First HD yesterday- tolerated well - removed one liter- for second tx today   Objective Vital signs in last 24 hours: Vitals:   03/16/17 1759 03/16/17 2026 03/17/17 0351 03/17/17 0942  BP: (!) 148/74 (!) 162/53 (!) 173/55 (!) 166/62  Pulse: 96 91 89 89  Resp: 18 (!) 21    Temp: 97.8 F (36.6 C) 98.5 F (36.9 C) 98.2 F (36.8 C) 98.6 F (37 C)  TempSrc: Oral   Oral  SpO2: 100% 98% 100% 97%  Weight:  116 kg (255 lb 11.7 oz)    Height:       Weight change: 0.5 kg (1 lb 1.6 oz)  Intake/Output Summary (Last 24 hours) at 03/17/17 1204 Last data filed at 03/17/17 0943  Gross per 24 hour  Intake              360 ml  Output              900 ml  Net             -540 ml    Assessment/Plan: 76 year old black female with advanced CKD and now what appears to be uremic symptoms 1.Renal- patient with very advanced CKD at baseline.  From hospitalization last week to this week kidney function has worsened and now GFR of around 10 ml/min. It seems that along with this is, she has had failure to thrive which I suspect may be uremia related.  I spoke to the patient and her daughter [name] who is medical power of attorney and we were at the agreement that  dialysis should be initiated in order to see if her clinical situation improves. there is an issue with vascular access- she will need a thigh graft and a femoral catheter- done today.  Plan is for initiation of HD- pt and daughter accepting.  For first treatment 9/27, second 9/28- third 9/29.  In search for OP HD unit  2. Hypertension/volume  -  have stopped IVF- I think was just diluting her blood as no UOP recorded, although now is some 3. Pyuria- patient may have UTI- urine culture just showed many species 4. Anemia  - hemoglobin 9- iron stores low- replete iron and start ESA  5. Bones- PTH 623- phos OK no binder- started calcitriol  6. Hyperkalemia- resolved   Arin Peral A    Labs: Basic Metabolic Panel:  Recent  Labs Lab 03/15/17 0449 03/16/17 0416 03/17/17 0357  NA 147* 145 143  K 5.1 4.7 4.3  CL 115* 117* 112*  CO2 23 23 28   GLUCOSE 84 103* 174*  BUN 45* 39* 25*  CREATININE 4.04* 3.54* 2.84*  CALCIUM 7.8* 8.0* 8.0*  PHOS 4.0 4.1 3.5   Liver Function Tests:  Recent Labs Lab 03/15/17 0449 03/16/17 0416 03/17/17 0357  ALBUMIN 2.5* 2.2* 2.2*   No results for input(s): LIPASE, AMYLASE in the last 168 hours. No results for input(s): AMMONIA in the last 168 hours. CBC:  Recent Labs Lab 03/12/17 1800 03/14/17 1036 03/15/17 0449  WBC 12.1* 5.9 7.8  NEUTROABS 9.2*  --   --   HGB 9.0* 9.6* 9.1*  HCT 29.3* 30.7* 29.8*  MCV 78.8 78.9 78.8  PLT 163 164 169   Cardiac Enzymes:  Recent Labs Lab 03/12/17 1800  TROPONINI <0.03   CBG:  Recent Labs Lab 03/16/17 2025 03/16/17 2352 03/17/17 0350 03/17/17 0747 03/17/17 1148  GLUCAP 173* 222* 178* 157* 207*    Iron Studies:  No results for input(s):  IRON, TIBC, TRANSFERRIN, FERRITIN in the last 72 hours. Studies/Results: Dg Abd Portable 1v  Result Date: 03/16/2017 CLINICAL DATA:  Post femoral catheter placement, diabetes mellitus, hypertension, CHF EXAM: PORTABLE ABDOMEN - 1 VIEW COMPARISON:  Portable exam 1007 hours without priors for comparison FINDINGS: RIGHT femoral line with tip projecting over expected position of the RIGHT common iliac vein. IVC filter noted. Nonobstructive bowel gas pattern with prominent stool throughout colon. Surgical clips RIGHT upper quadrant question cholecystectomy. Bones demineralized. IMPRESSION: Tip of RIGHT femoral line projects over expected position of RIGHT common iliac vein. Increased stool in colon. Electronically Signed   By: Lavonia Dana M.D.   On: 03/16/2017 10:24   Dg Fluoro Guide Cv Line-no Report  Result Date: 03/16/2017 Fluoroscopy was utilized by the requesting physician.  No radiographic interpretation.   Medications: Infusions: . sodium chloride    . sodium chloride    .  ferric gluconate (FERRLECIT/NULECIT) IV Stopped (03/15/17 1159)    Scheduled Medications: . aspirin  325 mg Oral Daily  . calcitRIOL  0.5 mcg Oral Daily  . darbepoetin (ARANESP) injection - DIALYSIS  60 mcg Intravenous Q Thu-HD  . heparin  5,000 Units Subcutaneous Q8H  . insulin aspart  0-9 Units Subcutaneous Q4H  . insulin glargine  6 Units Subcutaneous Daily  . methocarbamol  500 mg Oral QID  . pantoprazole  40 mg Oral Daily  . senna-docusate  1 tablet Oral QHS  . simvastatin  40 mg Oral q1800    have reviewed scheduled and prn medications.  Physical Exam: General: Obese, no c/o's pain better controlled  Heart: Regular rate and rhythm Lungs: Poor effort, decreased breath sounds at the bases Abdomen: Obese, soft, nontender Extremities: Dependent pitting edema Dialysis Access: right femoral pc and left AVG- good bruit today    03/17/2017,12:04 PM  LOS: 5 days

## 2017-03-18 LAB — RENAL FUNCTION PANEL
Albumin: 2.2 g/dL — ABNORMAL LOW (ref 3.5–5.0)
Anion gap: 8 (ref 5–15)
BUN: 17 mg/dL (ref 6–20)
CO2: 25 mmol/L (ref 22–32)
Calcium: 7.9 mg/dL — ABNORMAL LOW (ref 8.9–10.3)
Chloride: 106 mmol/L (ref 101–111)
Creatinine, Ser: 2.34 mg/dL — ABNORMAL HIGH (ref 0.44–1.00)
GFR calc Af Amer: 22 mL/min — ABNORMAL LOW (ref >60)
GFR calc non Af Amer: 19 mL/min — ABNORMAL LOW (ref >60)
Glucose, Bld: 154 mg/dL — ABNORMAL HIGH (ref 65–99)
Phosphorus: 2.6 mg/dL (ref 2.5–4.6)
Potassium: 4 mmol/L (ref 3.5–5.1)
Sodium: 139 mmol/L (ref 135–145)

## 2017-03-18 LAB — GLUCOSE, CAPILLARY
Glucose-Capillary: 112 mg/dL — ABNORMAL HIGH (ref 65–99)
Glucose-Capillary: 131 mg/dL — ABNORMAL HIGH (ref 65–99)
Glucose-Capillary: 137 mg/dL — ABNORMAL HIGH (ref 65–99)
Glucose-Capillary: 143 mg/dL — ABNORMAL HIGH (ref 65–99)
Glucose-Capillary: 151 mg/dL — ABNORMAL HIGH (ref 65–99)

## 2017-03-18 MED ORDER — DARBEPOETIN ALFA 60 MCG/0.3ML IJ SOSY
PREFILLED_SYRINGE | INTRAMUSCULAR | Status: AC
  Administered 2017-03-18: 60 ug via INTRAVENOUS
  Filled 2017-03-18: qty 0.3

## 2017-03-18 MED ORDER — METHOCARBAMOL 500 MG PO TABS: 500.0000 mg | ORAL_TABLET | Freq: Three times a day (TID) | ORAL | Status: DC | PRN

## 2017-03-18 NOTE — Progress Notes (Signed)
Subjective:  second HD yesterday- tolerated well - removed one liter- for third  tx today - she is c/o leg pain where thigh AVG is  Objective Vital signs in last 24 hours: Vitals:   03/17/17 2205 03/17/17 2212 03/17/17 2305 03/18/17 0419  BP: (!) 158/74 (!) 165/65 (!) 148/50 (!) 159/57  Pulse: 93 95 98 93  Resp: 20 19 18 16   Temp:  98.1 F (36.7 C) 98.3 F (36.8 C) 98.2 F (36.8 C)  TempSrc:  Oral Oral Oral  SpO2: 98% 98% 100% 99%  Weight:  113.1 kg (249 lb 5.4 oz) 114.6 kg (252 lb 10.4 oz)   Height:       Weight change: -0.4 kg (-14.1 oz)  Intake/Output Summary (Last 24 hours) at 03/18/17 5465 Last data filed at 03/18/17 0606  Gross per 24 hour  Intake              870 ml  Output             1075 ml  Net             -205 ml    Assessment/Plan: 76 year old black female with advanced CKD and now what appears to be uremic symptoms 1.Renal- patient with very advanced CKD at baseline.  From hospitalization last week to this week kidney function has worsened and GFR of around 10 ml/min. It seems that along with this is, she has had failure to thrive which I suspect may be uremia related.  I spoke to the patient and her daughter [name] who is medical power of attorney and we were at the agreement that  dialysis should be initiated in order to see if her clinical situation improves. there is an issue with vascular access- she needed a thigh graft and a femoral catheter- done Thursday.   initiation of HD-  For first treatment 9/27, second 9/28- third 9/29.  In search for OP HD unit  2. Hypertension/volume  -  have stopped IVF- I think was just diluting her blood - fluid removal with HD 3. Pyuria- patient may have UTI- urine culture just showed many species 4. Anemia  - hemoglobin 9- iron stores low- replete iron and start ESA  5. Bones- PTH 623- phos OK no binder- started calcitriol  6. Hyperkalemia- resolved 7. Getting robaxin scheduled- will change to PRN    Azarian Starace  A    Labs: Basic Metabolic Panel:  Recent Labs Lab 03/17/17 0357 03/17/17 2020 03/18/17 0321  NA 143 138 139  K 4.3 4.4 4.0  CL 112* 108 106  CO2 28 23 25   GLUCOSE 174* 240* 154*  BUN 25* 27* 17  CREATININE 2.84* 3.09* 2.34*  CALCIUM 8.0* 8.2* 7.9*  PHOS 3.5 3.0 2.6   Liver Function Tests:  Recent Labs Lab 03/17/17 0357 03/17/17 2020 03/18/17 0321  ALBUMIN 2.2* 2.3* 2.2*   No results for input(s): LIPASE, AMYLASE in the last 168 hours. No results for input(s): AMMONIA in the last 168 hours. CBC:  Recent Labs Lab 03/12/17 1800 03/14/17 1036 03/15/17 0449 03/17/17 2020  WBC 12.1* 5.9 7.8 8.4  NEUTROABS 9.2*  --   --   --   HGB 9.0* 9.6* 9.1* 8.4*  HCT 29.3* 30.7* 29.8* 26.0*  MCV 78.8 78.9 78.8 77.6*  PLT 163 164 169 113*   Cardiac Enzymes:  Recent Labs Lab 03/12/17 1800  TROPONINI <0.03   CBG:  Recent Labs Lab 03/17/17 1602 03/17/17 2303 03/18/17 0019 03/18/17 0415 03/18/17 6812  GLUCAP 211* 115* 151* 143* 112*    Iron Studies:  No results for input(s): IRON, TIBC, TRANSFERRIN, FERRITIN in the last 72 hours. Studies/Results: Dg Abd Portable 1v  Result Date: 03/16/2017 CLINICAL DATA:  Post femoral catheter placement, diabetes mellitus, hypertension, CHF EXAM: PORTABLE ABDOMEN - 1 VIEW COMPARISON:  Portable exam 1007 hours without priors for comparison FINDINGS: RIGHT femoral line with tip projecting over expected position of the RIGHT common iliac vein. IVC filter noted. Nonobstructive bowel gas pattern with prominent stool throughout colon. Surgical clips RIGHT upper quadrant question cholecystectomy. Bones demineralized. IMPRESSION: Tip of RIGHT femoral line projects over expected position of RIGHT common iliac vein. Increased stool in colon. Electronically Signed   By: Lavonia Dana M.D.   On: 03/16/2017 10:24   Medications: Infusions: . sodium chloride    . sodium chloride    . ferric gluconate (FERRLECIT/NULECIT) IV 125 mg (03/17/17 2055)     Scheduled Medications: . aspirin  325 mg Oral Daily  . calcitRIOL  0.5 mcg Oral Daily  . darbepoetin (ARANESP) injection - DIALYSIS  60 mcg Intravenous Q Sat-HD  . heparin  5,000 Units Subcutaneous Q8H  . insulin aspart  0-9 Units Subcutaneous Q4H  . insulin glargine  6 Units Subcutaneous Daily  . methocarbamol  500 mg Oral QID  . pantoprazole  40 mg Oral Daily  . senna-docusate  1 tablet Oral QHS  . simvastatin  40 mg Oral q1800    have reviewed scheduled and prn medications.  Physical Exam: General: Obese, more pain today  Heart: Regular rate and rhythm Lungs: Poor effort, decreased breath sounds at the bases Abdomen: Obese, soft, nontender Extremities: Dependent pitting edema Dialysis Access: right femoral pc and left AVG- good bruit today    03/18/2017,9:52 AM  LOS: 6 days

## 2017-03-18 NOTE — Progress Notes (Signed)
PROGRESS NOTE    Season Astacio  TDD:[id number] DOB: 01/06/1941 DOA: 03/12/2017 PCP: Darlina Rumpf, MD   Brief Narrative: 76 year old female with history of hypertension, type 2 diabetes, chronic back pain, chronic kidney disease stage IV presented with episode of syncope at home. Patient also with worsening mental status over last 1 week. Patient was hypoglycemic on admission. Worsening renal failure with hyperkalemia. Admitted for further evaluation. Patient was recently admitted for TIA workup.  Assessment & Plan:  # Acute encephalopathy likely uremic/metabolic in the setting of renal failure and multiple pain medications. -Mental status improved. -CT head with no acute finding -PT OT evaluation recommended skilled nursing facility. Social worker evaluation ongoing.  #Acute kidney injury on chronic kidney disease stage IV versus progressive CKD IV with uremic symptoms: - Ultrasound of kidneys with no hydronephrosis or acute finding.  -Status post right femoral tunneled catheter placement and placement of left thigh AVG by vascular on 9/27 and initiated dialysis. -Outpatient hemodialysis center set up evaluation ongoing. Patient is tolerating dialysis well.  #Anemia of chronic kidney disease: Iron and ESA as per nephrology. Monitor CBC.  #Type 2 diabetes with hypoglycemia on admission: Patient has A1c of 7.2 on 03/02/2017. -Blood sugar level is now elevated. Continue current insulin regimen. Monitor blood sugar level.  #History of TIA: Continue aspirin, zocor  #Hypertension:  Initiated hemodialysis. Monitor blood pressure closely.  #Chronic diastolic congestive heart failure: Continue cardiac medication  # Chronic back pain: Continue Robaxin for muscle relaxant. Holding sedatives.  #Thrombocytopenia: Discontinue heparin subcutaneous. Continue SCD for DVT prophylaxis. Monitor CBC.  DVT prophylaxis: SCD Code Status: DO NOT RESUSCITATE Family Communication: No family member at  bedside Disposition Plan: Currently admitted, likely discharge to skilled nursing facility when bed is available and after outpatient dialysis center.     Consultants:   Nephrologist  Vascular surgery  Procedures: None Antimicrobials: None  Subjective: Seen and examined at bedside. No new event. Denied headache, digits, nausea vomiting or chest pain. Objective: Vitals:   03/17/17 2212 03/17/17 2305 03/18/17 0419 03/18/17 1004  BP: (!) 165/65 (!) 148/50 (!) 159/57 (!) 155/52  Pulse: 95 98 93 86  Resp: 19 18 16 17   Temp: 98.1 F (36.7 C) 98.3 F (36.8 C) 98.2 F (36.8 C) 97.7 F (36.5 C)  TempSrc: Oral Oral Oral Oral  SpO2: 98% 100% 99% 99%  Weight: 113.1 kg (249 lb 5.4 oz) 114.6 kg (252 lb 10.4 oz)    Height:        Intake/Output Summary (Last 24 hours) at 03/18/17 1146 Last data filed at 03/18/17 1004  Gross per 24 hour  Intake             1110 ml  Output             1075 ml  Net               35 ml   Filed Weights   03/17/17 1924 03/17/17 2212 03/17/17 2305  Weight: 114.1 kg (251 lb 8.7 oz) 113.1 kg (249 lb 5.4 oz) 114.6 kg (252 lb 10.4 oz)    Examination:  General exam: Not in distress Respiratory system: Bibasal decreased,nowheezing Cardiovascular system: Regular rate rhythm, S1 and S2 normal. No lower extremity edema Gastrointestinal system: Abdomen soft, nontender. Bowel sound positive Central nervous system: Alert awake oriented 3. Nonfocal. Skin: No rashes, lesions or ulcers Right femoral tunneled catheter and left thigh graft. Unchanged Data Reviewed: I have personally reviewed following labs and imaging studies  CBC:  Recent Labs Lab 03/12/17 1800 03/14/17 1036 03/15/17 0449 03/17/17 2020  WBC 12.1* 5.9 7.8 8.4  NEUTROABS 9.2*  --   --   --   HGB 9.0* 9.6* 9.1* 8.4*  HCT 29.3* 30.7* 29.8* 26.0*  MCV 78.8 78.9 78.8 77.6*  PLT 163 164 169 024*   Basic Metabolic Panel:  Recent Labs Lab 03/15/17 0449 03/16/17 0416 03/17/17 0357  03/17/17 2020 03/18/17 0321  NA 147* 145 143 138 139  K 5.1 4.7 4.3 4.4 4.0  CL 115* 117* 112* 108 106  CO2 23 23 28 23 25   GLUCOSE 84 103* 174* 240* 154*  BUN 45* 39* 25* 27* 17  CREATININE 4.04* 3.54* 2.84* 3.09* 2.34*  CALCIUM 7.8* 8.0* 8.0* 8.2* 7.9*  PHOS 4.0 4.1 3.5 3.0 2.6   GFR: Estimated Creatinine Clearance: 25.3 mL/min (A) (by C-G formula based on SCr of 2.34 mg/dL (H)). Liver Function Tests:  Recent Labs Lab 03/15/17 0449 03/16/17 0416 03/17/17 0357 03/17/17 2020 03/18/17 0321  ALBUMIN 2.5* 2.2* 2.2* 2.3* 2.2*   No results for input(s): LIPASE, AMYLASE in the last 168 hours. No results for input(s): AMMONIA in the last 168 hours. Coagulation Profile: No results for input(s): INR, PROTIME in the last 168 hours. Cardiac Enzymes:  Recent Labs Lab 03/12/17 1800  TROPONINI <0.03   BNP (last 3 results) No results for input(s): PROBNP in the last 8760 hours. HbA1C: No results for input(s): HGBA1C in the last 72 hours. CBG:  Recent Labs Lab 03/17/17 1602 03/17/17 2303 03/18/17 0019 03/18/17 0415 03/18/17 0806  GLUCAP 211* 115* 151* 143* 112*   Lipid Profile: No results for input(s): CHOL, HDL, LDLCALC, TRIG, CHOLHDL, LDLDIRECT in the last 72 hours. Thyroid Function Tests: No results for input(s): TSH, T4TOTAL, FREET4, T3FREE, THYROIDAB in the last 72 hours. Anemia Panel: No results for input(s): VITAMINB12, FOLATE, FERRITIN, TIBC, IRON, RETICCTPCT in the last 72 hours. Sepsis Labs: No results for input(s): PROCALCITON, LATICACIDVEN in the last 168 hours.  Recent Results (from the past 240 hour(s))  Urine Culture     Status: Abnormal   Collection Time: 03/13/17  4:05 PM  Result Value Ref Range Status   Specimen Description URINE, RANDOM  Final   Special Requests NONE  Final   Culture MULTIPLE SPECIES PRESENT, SUGGEST RECOLLECTION (A)  Final   Report Status 03/15/2017 FINAL  Final  Surgical pcr screen     Status: None   Collection Time: 03/15/17  10:39 PM  Result Value Ref Range Status   MRSA, PCR NEGATIVE NEGATIVE Final   Staphylococcus aureus NEGATIVE NEGATIVE Final    Comment: (NOTE) The Xpert SA Assay (FDA approved for NASAL specimens in patients 53 years of age and older), is one component of a comprehensive surveillance program. It is not intended to diagnose infection nor to guide or monitor treatment.          Radiology Studies: No results found.      Scheduled Meds: . aspirin  325 mg Oral Daily  . calcitRIOL  0.5 mcg Oral Daily  . darbepoetin (ARANESP) injection - DIALYSIS  60 mcg Intravenous Q Sat-HD  . insulin aspart  0-9 Units Subcutaneous Q4H  . insulin glargine  6 Units Subcutaneous Daily  . pantoprazole  40 mg Oral Daily  . senna-docusate  1 tablet Oral QHS  . simvastatin  40 mg Oral q1800   Continuous Infusions: . sodium chloride    . sodium chloride    . ferric gluconate (FERRLECIT/NULECIT) IV 125  mg (03/17/17 2055)     LOS: 6 days    Kamal Jurgens Tanna Furry, MD [facility] Pager [phone]  If 7PM-7AM, please contact night-coverage [email or web address] Password [email or web address] 03/18/2017, 11:46 AM

## 2017-03-18 NOTE — Progress Notes (Signed)
Pt returned to unit at 2245 after report called from dialysis per report pt ran for 2.5 hours and 1083ml was removed. Ferrous gluconate was given in dialysis along with heparin 3400 units. Vital signs stable. Right chest portacath with ns infusing at kvo which was removed without noted order. On tele with box # 9 and SR @ 99 with PVCs. T/c received from dtr and update was given per request. All surgical sites are well approximated with dermabond and is without any noted s/s of infection noted. Able to make needs known. callbell within reach. Safety maintained. Will continue to monitor.

## 2017-03-19 LAB — GLUCOSE, CAPILLARY
Glucose-Capillary: 105 mg/dL — ABNORMAL HIGH (ref 65–99)
Glucose-Capillary: 105 mg/dL — ABNORMAL HIGH (ref 65–99)
Glucose-Capillary: 124 mg/dL — ABNORMAL HIGH (ref 65–99)
Glucose-Capillary: 135 mg/dL — ABNORMAL HIGH (ref 65–99)
Glucose-Capillary: 151 mg/dL — ABNORMAL HIGH (ref 65–99)
Glucose-Capillary: 91 mg/dL (ref 65–99)

## 2017-03-19 LAB — CBC
HCT: 26.7 % — ABNORMAL LOW (ref 36.0–46.0)
Hemoglobin: 8.7 g/dL — ABNORMAL LOW (ref 12.0–15.0)
MCH: 25 pg — ABNORMAL LOW (ref 26.0–34.0)
MCHC: 32.6 g/dL (ref 30.0–36.0)
MCV: 76.7 fL — ABNORMAL LOW (ref 78.0–100.0)
Platelets: 90 10*3/uL — ABNORMAL LOW (ref 150–400)
RBC: 3.48 MIL/uL — ABNORMAL LOW (ref 3.87–5.11)
RDW: 15.6 % — ABNORMAL HIGH (ref 11.5–15.5)
WBC: 7.6 10*3/uL (ref 4.0–10.5)

## 2017-03-19 MED ORDER — INSULIN ASPART 100 UNIT/ML ~~LOC~~ SOLN
0.0000 [IU] | Freq: Three times a day (TID) | SUBCUTANEOUS | Status: DC
  Administered 2017-03-21 (×2): 1 [IU] via SUBCUTANEOUS
  Administered 2017-03-22: 3 [IU] via SUBCUTANEOUS

## 2017-03-19 MED ORDER — CARVEDILOL 6.25 MG PO TABS
6.2500 mg | ORAL_TABLET | Freq: Two times a day (BID) | ORAL | Status: DC
  Administered 2017-03-19 – 2017-03-22 (×5): 6.25 mg via ORAL
  Filled 2017-03-19 (×5): qty 1

## 2017-03-19 NOTE — Progress Notes (Signed)
Subjective:  third HD yesterday- tolerated well - removed 2 liter-  - she looks good, has not been up at all - making some urine  Objective Vital signs in last 24 hours: Vitals:   03/18/17 1920 03/18/17 2105 03/19/17 0457 03/19/17 0922  BP: (!) 157/62 (!) 153/56 (!) 147/65 (!) 175/56  Pulse: 84 90 86 83  Resp: 18 18 18 17   Temp: 98.4 F (36.9 C) 98.5 F (36.9 C) 98.5 F (36.9 C) 98.5 F (36.9 C)  TempSrc: Oral Oral Oral Oral  SpO2: 97% 100% 98% 100%  Weight: 109.2 kg (240 lb 11.9 oz) 109.3 kg (241 lb)    Height:       Weight change: -2.9 kg (-6 lb 6.3 oz)  Intake/Output Summary (Last 24 hours) at 03/19/17 1110 Last data filed at 03/19/17 1054  Gross per 24 hour  Intake              360 ml  Output             2252 ml  Net            -1892 ml    Assessment/Plan: 76 year old black female with advanced CKD and now what appears to be uremic symptoms 1.Renal- patient with very advanced CKD at baseline.  From hospitalization last week to this week kidney function had worsened and GFR  around 10 ml/min. It seems that along with this is, she has had failure to thrive which I suspected was uremia related.  I spoke to the patient and her daughter [name] who is medical power of attorney and we were at the agreement that  dialysis should be initiated in order to see if her clinical situation improves. there is an issue with vascular access- she needed a thigh graft and a femoral catheter- done Thursday.   initiation of HD-  For first treatment 9/27, second 9/28- third 9/29.  In search for OP HD unit.  Plan for 4th HD tomorrow  2. Hypertension/volume  -- fluid removal with HD.  Was on coreg as OP- will resume  3. Pyuria-  urine culture just showed many species 4. Anemia  - hemoglobin 9--8.7  iron stores low- replete iron and started ESA  5. Bones- PTH 623- phos OK no binder- started calcitriol  6. Hyperkalemia- resolved 7. Getting robaxin scheduled- changed to PRN    Buffie Herne  A    Labs: Basic Metabolic Panel:  Recent Labs Lab 03/17/17 0357 03/17/17 2020 03/18/17 0321  NA 143 138 139  K 4.3 4.4 4.0  CL 112* 108 106  CO2 28 23 25   GLUCOSE 174* 240* 154*  BUN 25* 27* 17  CREATININE 2.84* 3.09* 2.34*  CALCIUM 8.0* 8.2* 7.9*  PHOS 3.5 3.0 2.6   Liver Function Tests:  Recent Labs Lab 03/17/17 0357 03/17/17 2020 03/18/17 0321  ALBUMIN 2.2* 2.3* 2.2*   No results for input(s): LIPASE, AMYLASE in the last 168 hours. No results for input(s): AMMONIA in the last 168 hours. CBC:  Recent Labs Lab 03/12/17 1800 03/14/17 1036 03/15/17 0449 03/17/17 2020 03/19/17 0542  WBC 12.1* 5.9 7.8 8.4 7.6  NEUTROABS 9.2*  --   --   --   --   HGB 9.0* 9.6* 9.1* 8.4* 8.7*  HCT 29.3* 30.7* 29.8* 26.0* 26.7*  MCV 78.8 78.9 78.8 77.6* 76.7*  PLT 163 164 169 113* 90*   Cardiac Enzymes:  Recent Labs Lab 03/12/17 1800  TROPONINI <0.03   CBG:  Recent Labs Lab  03/18/17 1208 03/18/17 2101 03/19/17 0010 03/19/17 0455 03/19/17 0754  GLUCAP 137* 131* 124* 91 105*    Iron Studies:  No results for input(s): IRON, TIBC, TRANSFERRIN, FERRITIN in the last 72 hours. Studies/Results: No results found. Medications: Infusions: . ferric gluconate (FERRLECIT/NULECIT) IV 125 mg (03/19/17 1045)    Scheduled Medications: . aspirin  325 mg Oral Daily  . calcitRIOL  0.5 mcg Oral Daily  . darbepoetin (ARANESP) injection - DIALYSIS  60 mcg Intravenous Q Sat-HD  . insulin aspart  0-9 Units Subcutaneous Q4H  . insulin glargine  6 Units Subcutaneous Daily  . pantoprazole  40 mg Oral Daily  . senna-docusate  1 tablet Oral QHS  . simvastatin  40 mg Oral q1800    have reviewed scheduled and prn medications.  Physical Exam: General: more alert today, no distress  Heart: Regular rate and rhythm Lungs: Poor effort, decreased breath sounds at the bases Abdomen: Obese, soft, nontender Extremities: Dependent pitting edema Dialysis Access: right femoral pc and left  AVG- good bruit    03/19/2017,11:10 AM  LOS: 7 days

## 2017-03-19 NOTE — Progress Notes (Signed)
Messaged MD to see if pt needs to be ACHS or Q 4. This nurse was told pt is supposed to be ACHS now, but order is not changed.   Eleanora Neighbor, RN

## 2017-03-19 NOTE — Progress Notes (Signed)
PROGRESS NOTE    Ashlee Miles  XTG:[id number] DOB: April 08, 1941 DOA: 03/12/2017 PCP: Darlina Rumpf, MD   Brief Narrative: 76 year old female with history of hypertension, type 2 diabetes, chronic back pain, chronic kidney disease stage IV presented with episode of syncope at home. Patient also with worsening mental status over last 1 week. Patient was hypoglycemic on admission. Worsening renal failure with hyperkalemia. Admitted for further evaluation. Patient was recently admitted for TIA workup.  Assessment & Plan:  # Acute encephalopathy likely uremic/metabolic in the setting of renal failure and multiple pain medications. -Mental status improved. -CT head with no acute finding -PT OT evaluation recommended skilled nursing facility. Social worker evaluation ongoing.  #Acute kidney injury on chronic kidney disease stage IV versus progressive CKD IV with uremic symptoms: - Ultrasound of kidneys with no hydronephrosis or acute finding.  -Status post right femoral tunneled catheter placement and placement of left thigh AVG by vascular on 9/27 and initiated dialysis. -Outpatient hemodialysis center set up evaluation ongoing. Patient is tolerating dialysis well. -next HD tomorrow. Discussed with nephrology.  #Anemia of chronic kidney disease: Iron and ESA as per nephrology. Monitor CBC.  #Type 2 diabetes with hypoglycemia on admission: Patient has A1c of 7.2 on 03/02/2017. -Blood sugar level is now elevated. Continue current insulin regimen. Monitor blood sugar level.  #History of TIA: Continue aspirin, zocor  #Hypertension:  Initiated hemodialysis. Monitor blood pressure closely. Resume coreg.  #Chronic diastolic congestive heart failure: Continue cardiac medication  # Chronic back pain: Continue Robaxin for muscle relaxant. Holding sedatives.  #Thrombocytopenia: Discontinue heparin subcutaneous. Platelet 90 today. No sign of bleeding. Repeat CBC in the morning.  DVT prophylaxis:  SCD Code Status: DO NOT RESUSCITATE Family Communication: No family member at bedside Disposition Plan: Currently admitted, likely discharge to skilled nursing facility when bed is available and after outpatient dialysis center.     Consultants:   Nephrologist  Vascular surgery  Procedures: None Antimicrobials: None  Subjective: Seen and examined at bedside. Denied headache, dizziness, nausea vomiting or chest pain. Objective: Vitals:   03/18/17 1920 03/18/17 2105 03/19/17 0457 03/19/17 0922  BP: (!) 157/62 (!) 153/56 (!) 147/65 (!) 175/56  Pulse: 84 90 86 83  Resp: 18 18 18 17   Temp: 98.4 F (36.9 C) 98.5 F (36.9 C) 98.5 F (36.9 C) 98.5 F (36.9 C)  TempSrc: Oral Oral Oral Oral  SpO2: 97% 100% 98% 100%  Weight: 109.2 kg (240 lb 11.9 oz) 109.3 kg (241 lb)    Height:        Intake/Output Summary (Last 24 hours) at 03/19/17 1219 Last data filed at 03/19/17 1054  Gross per 24 hour  Intake              360 ml  Output             2252 ml  Net            -1892 ml   Filed Weights   03/18/17 1603 03/18/17 1920 03/18/17 2105  Weight: 111.2 kg (245 lb 2.4 oz) 109.2 kg (240 lb 11.9 oz) 109.3 kg (241 lb)    Examination:  General exam: Not in distress Respiratory system: Clear bilateral, no wheezing or crackle Cardiovascular system: Regular rate rhythm S1 and S2 normal. No lower extremity edema. Gastrointestinal system: Abdomen soft, nontender. Bowel sound positive Central nervous system: Alert awake and oriented. No focal neurological deficit. Skin: No rashes, lesions or ulcers Right femoral tunneled catheter and left thigh graft. Unchanged Data Reviewed: I  have personally reviewed following labs and imaging studies  CBC:  Recent Labs Lab 03/12/17 1800 03/14/17 1036 03/15/17 0449 03/17/17 2020 03/19/17 0542  WBC 12.1* 5.9 7.8 8.4 7.6  NEUTROABS 9.2*  --   --   --   --   HGB 9.0* 9.6* 9.1* 8.4* 8.7*  HCT 29.3* 30.7* 29.8* 26.0* 26.7*  MCV 78.8 78.9 78.8  77.6* 76.7*  PLT 163 164 169 113* 90*   Basic Metabolic Panel:  Recent Labs Lab 03/15/17 0449 03/16/17 0416 03/17/17 0357 03/17/17 2020 03/18/17 0321  NA 147* 145 143 138 139  K 5.1 4.7 4.3 4.4 4.0  CL 115* 117* 112* 108 106  CO2 23 23 28 23 25   GLUCOSE 84 103* 174* 240* 154*  BUN 45* 39* 25* 27* 17  CREATININE 4.04* 3.54* 2.84* 3.09* 2.34*  CALCIUM 7.8* 8.0* 8.0* 8.2* 7.9*  PHOS 4.0 4.1 3.5 3.0 2.6   GFR: Estimated Creatinine Clearance: 24.7 mL/min (A) (by C-G formula based on SCr of 2.34 mg/dL (H)). Liver Function Tests:  Recent Labs Lab 03/15/17 0449 03/16/17 0416 03/17/17 0357 03/17/17 2020 03/18/17 0321  ALBUMIN 2.5* 2.2* 2.2* 2.3* 2.2*   No results for input(s): LIPASE, AMYLASE in the last 168 hours. No results for input(s): AMMONIA in the last 168 hours. Coagulation Profile: No results for input(s): INR, PROTIME in the last 168 hours. Cardiac Enzymes:  Recent Labs Lab 03/12/17 1800  TROPONINI <0.03   BNP (last 3 results) No results for input(s): PROBNP in the last 8760 hours. HbA1C: No results for input(s): HGBA1C in the last 72 hours. CBG:  Recent Labs Lab 03/18/17 2101 03/19/17 0010 03/19/17 0455 03/19/17 0754 03/19/17 1119  GLUCAP 131* 124* 91 105* 135*   Lipid Profile: No results for input(s): CHOL, HDL, LDLCALC, TRIG, CHOLHDL, LDLDIRECT in the last 72 hours. Thyroid Function Tests: No results for input(s): TSH, T4TOTAL, FREET4, T3FREE, THYROIDAB in the last 72 hours. Anemia Panel: No results for input(s): VITAMINB12, FOLATE, FERRITIN, TIBC, IRON, RETICCTPCT in the last 72 hours. Sepsis Labs: No results for input(s): PROCALCITON, LATICACIDVEN in the last 168 hours.  Recent Results (from the past 240 hour(s))  Urine Culture     Status: Abnormal   Collection Time: 03/13/17  4:05 PM  Result Value Ref Range Status   Specimen Description URINE, RANDOM  Final   Special Requests NONE  Final   Culture MULTIPLE SPECIES PRESENT, SUGGEST  RECOLLECTION (A)  Final   Report Status 03/15/2017 FINAL  Final  Surgical pcr screen     Status: None   Collection Time: 03/15/17 10:39 PM  Result Value Ref Range Status   MRSA, PCR NEGATIVE NEGATIVE Final   Staphylococcus aureus NEGATIVE NEGATIVE Final    Comment: (NOTE) The Xpert SA Assay (FDA approved for NASAL specimens in patients 29 years of age and older), is one component of a comprehensive surveillance program. It is not intended to diagnose infection nor to guide or monitor treatment.          Radiology Studies: No results found.      Scheduled Meds: . aspirin  325 mg Oral Daily  . calcitRIOL  0.5 mcg Oral Daily  . carvedilol  6.25 mg Oral BID WC  . darbepoetin (ARANESP) injection - DIALYSIS  60 mcg Intravenous Q Sat-HD  . insulin aspart  0-9 Units Subcutaneous Q4H  . insulin glargine  6 Units Subcutaneous Daily  . pantoprazole  40 mg Oral Daily  . senna-docusate  1 tablet Oral QHS  .  simvastatin  40 mg Oral q1800   Continuous Infusions: . ferric gluconate (FERRLECIT/NULECIT) IV Stopped (03/19/17 1145)     LOS: 7 days    Ashlee Miles Tanna Furry, MD [facility] Pager [phone]  If 7PM-7AM, please contact night-coverage [email or web address] Password [email or web address] 03/19/2017, 12:19 PM

## 2017-03-20 LAB — GLUCOSE, CAPILLARY
Glucose-Capillary: 111 mg/dL — ABNORMAL HIGH (ref 65–99)
Glucose-Capillary: 108 mg/dL — ABNORMAL HIGH (ref 65–99)
Glucose-Capillary: 115 mg/dL — ABNORMAL HIGH (ref 65–99)

## 2017-03-20 LAB — BASIC METABOLIC PANEL
Anion gap: 7 (ref 5–15)
BUN: 19 mg/dL (ref 6–20)
CO2: 24 mmol/L (ref 22–32)
Calcium: 8.3 mg/dL — ABNORMAL LOW (ref 8.9–10.3)
Chloride: 108 mmol/L (ref 101–111)
Creatinine, Ser: 2.78 mg/dL — ABNORMAL HIGH (ref 0.44–1.00)
GFR calc Af Amer: 18 mL/min — ABNORMAL LOW (ref >60)
GFR calc non Af Amer: 16 mL/min — ABNORMAL LOW (ref >60)
Glucose, Bld: 123 mg/dL — ABNORMAL HIGH (ref 65–99)
Potassium: 3.8 mmol/L (ref 3.5–5.1)
Sodium: 139 mmol/L (ref 135–145)

## 2017-03-20 LAB — CBC
HCT: 27.1 % — ABNORMAL LOW (ref 36.0–46.0)
Hemoglobin: 8.7 g/dL — ABNORMAL LOW (ref 12.0–15.0)
MCH: 24.7 pg — ABNORMAL LOW (ref 26.0–34.0)
MCHC: 32.1 g/dL (ref 30.0–36.0)
MCV: 77 fL — ABNORMAL LOW (ref 78.0–100.0)
Platelets: 105 10*3/uL — ABNORMAL LOW (ref 150–400)
RBC: 3.52 MIL/uL — ABNORMAL LOW (ref 3.87–5.11)
RDW: 15.6 % — ABNORMAL HIGH (ref 11.5–15.5)
WBC: 8.2 10*3/uL (ref 4.0–10.5)

## 2017-03-20 NOTE — Procedures (Signed)
4th HD today well tolerated Pre weight 107.6 kg Nadir BP 108/45 BFR 400 no R thigh TDC issues reported Pre K 3.8 Hb 8.7 CLIP still pending (info had to be re-sent this AM)  Jamal Maes, MD [place] Pager 03/20/2017, 1:22 PM

## 2017-03-20 NOTE — Progress Notes (Signed)
PT Cancellation Note  Patient Details Name: Velta Rockholt MRN: [id number] DOB: 12/15/1940   Cancelled Treatment:    Reason Eval/Treat Not Completed: Patient at procedure or test/unavailable   Currently in HD;  Will follow up later today as time allows;  Otherwise, will follow up for PT tomorrow;   Thank you,  Roney Marion, PT  [facility] Pager [phone] Office [place] 03/20/2017, 8:40 AM
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
## 2017-03-20 NOTE — Progress Notes (Signed)
PROGRESS NOTE    Ashlee Miles  FGH:[id number] DOB: [birth year] DOA: 03/12/2017 PCP: Darlina Rumpf, MD   Brief Narrative: 76 year old female with history of hypertension, type 2 diabetes, chronic back pain, chronic kidney disease stage IV presented with episode of syncope at home. Patient also with worsening mental status over last 1 week. Patient was hypoglycemic on admission. Worsening renal failure with hyperkalemia. Admitted for further evaluation. Patient was recently admitted for TIA workup.  Patient is medically stable. Receiving hemodialysis today. Continue current medical and supportive care. Discussed with the nephrology and dialysis nurse, waiting for outpatient hemodialysis center set up.  Social worker for a skilled nursing home placement. No change in medical management today.  Assessment & Plan:  # Acute encephalopathy likely uremic/metabolic in the setting of renal failure and multiple pain medications. -Mental status improved. -CT head with no acute finding -PT OT evaluation recommended skilled nursing facility. Social worker evaluation ongoing.  #Acute kidney injury on chronic kidney disease stage IV versus progressive CKD IV with uremic symptoms: - Ultrasound of kidneys with no hydronephrosis or acute finding.  -Status post right femoral tunneled catheter placement and placement of left thigh AVG by vascular on 9/27 and initiated dialysis. -Outpatient hemodialysis center set up evaluation ongoing. Patient is tolerating dialysis well.  #Anemia of chronic kidney disease: Iron and ESA as per nephrology. Monitor CBC.  #Type 2 diabetes with hypoglycemia on admission: Patient has A1c of 7.2 on 03/02/2017. -Blood sugar level is now elevated. Continue current insulin regimen. Monitor blood sugar level.  #History of TIA: Continue aspirin, zocor  #Hypertension:  Initiated hemodialysis. Monitor blood pressure closely.  #Chronic diastolic congestive heart failure: Continue  cardiac medication  # Chronic back pain: Continue Robaxin for muscle relaxant. Holding sedatives.  #Thrombocytopenia: Discontinue heparin subcutaneous. Continue SCD for DVT prophylaxis. plt 105  DVT prophylaxis: SCD Code Status: DO NOT RESUSCITATE Family Communication: No family member at bedside Disposition Plan: Currently admitted, likely discharge to skilled nursing facility when bed is available and after outpatient dialysis center.     Consultants:   Nephrologist  Vascular surgery  Procedures: None Antimicrobials: None  Subjective: Seen and examined at dialysis center. Denied headache, dizziness, nausea vomiting chest pain or shortness of breath. Objective: Vitals:   03/20/17 0956 03/20/17 1030 03/20/17 1100 03/20/17 1130  BP: 130/74 126/71 (!) 145/77 114/67  Pulse: 81 86 85 80  Resp: 19 19 18 18   Temp:      TempSrc:      SpO2: 100% 100%  100%  Weight:      Height:        Intake/Output Summary (Last 24 hours) at 03/20/17 1307 Last data filed at 03/20/17 0930  Gross per 24 hour  Intake              240 ml  Output             1401 ml  Net            -1161 ml   Filed Weights   03/18/17 2105 03/19/17 2307 03/20/17 0820  Weight: 109.3 kg (241 lb) 112.4 kg (247 lb 14.4 oz) 107.6 kg (237 lb 3.4 oz)    Examination:  General exam: Not in distress Respiratory system: Clear bilateral, no wheezing or crackle Cardiovascular system: Regular rate and rhythm, S1-S2 normal. No extremity edema. Gastrointestinal system: Abdomen soft, nontender. Bowel sound positive Central nervous system: Alert awake oriented 3. Nonfocal. Skin: No rashes, lesions or ulcers Right femoral tunneled catheter and left  thigh graft. Unchanged Data Reviewed: I have personally reviewed following labs and imaging studies  CBC:  Recent Labs Lab 03/14/17 1036 03/15/17 0449 03/17/17 2020 03/19/17 0542 03/20/17 0406  WBC 5.9 7.8 8.4 7.6 8.2  HGB 9.6* 9.1* 8.4* 8.7* 8.7*  HCT 30.7* 29.8*  26.0* 26.7* 27.1*  MCV 78.9 78.8 77.6* 76.7* 77.0*  PLT 164 169 113* 90* 542*   Basic Metabolic Panel:  Recent Labs Lab 03/15/17 0449 03/16/17 0416 03/17/17 0357 03/17/17 2020 03/18/17 0321 03/20/17 0406  NA 147* 145 143 138 139 139  K 5.1 4.7 4.3 4.4 4.0 3.8  CL 115* 117* 112* 108 106 108  CO2 23 23 28 23 25 24   GLUCOSE 84 103* 174* 240* 154* 123*  BUN 45* 39* 25* 27* 17 19  CREATININE 4.04* 3.54* 2.84* 3.09* 2.34* 2.78*  CALCIUM 7.8* 8.0* 8.0* 8.2* 7.9* 8.3*  PHOS 4.0 4.1 3.5 3.0 2.6  --    GFR: Estimated Creatinine Clearance: 20.6 mL/min (A) (by C-G formula based on SCr of 2.78 mg/dL (H)). Liver Function Tests:  Recent Labs Lab 03/15/17 0449 03/16/17 0416 03/17/17 0357 03/17/17 2020 03/18/17 0321  ALBUMIN 2.5* 2.2* 2.2* 2.3* 2.2*   No results for input(s): LIPASE, AMYLASE in the last 168 hours. No results for input(s): AMMONIA in the last 168 hours. Coagulation Profile: No results for input(s): INR, PROTIME in the last 168 hours. Cardiac Enzymes: No results for input(s): CKTOTAL, CKMB, CKMBINDEX, TROPONINI in the last 168 hours. BNP (last 3 results) No results for input(s): PROBNP in the last 8760 hours. HbA1C: No results for input(s): HGBA1C in the last 72 hours. CBG:  Recent Labs Lab 03/19/17 1119 03/19/17 1716 03/19/17 1801 03/19/17 2016 03/20/17 0750  GLUCAP 135* 111* 105* 151* 108*   Lipid Profile: No results for input(s): CHOL, HDL, LDLCALC, TRIG, CHOLHDL, LDLDIRECT in the last 72 hours. Thyroid Function Tests: No results for input(s): TSH, T4TOTAL, FREET4, T3FREE, THYROIDAB in the last 72 hours. Anemia Panel: No results for input(s): VITAMINB12, FOLATE, FERRITIN, TIBC, IRON, RETICCTPCT in the last 72 hours. Sepsis Labs: No results for input(s): PROCALCITON, LATICACIDVEN in the last 168 hours.  Recent Results (from the past 240 hour(s))  Urine Culture     Status: Abnormal   Collection Time: 03/13/17  4:05 PM  Result Value Ref Range Status    Specimen Description URINE, RANDOM  Final   Special Requests NONE  Final   Culture MULTIPLE SPECIES PRESENT, SUGGEST RECOLLECTION (A)  Final   Report Status 03/15/2017 FINAL  Final  Surgical pcr screen     Status: None   Collection Time: 03/15/17 10:39 PM  Result Value Ref Range Status   MRSA, PCR NEGATIVE NEGATIVE Final   Staphylococcus aureus NEGATIVE NEGATIVE Final    Comment: (NOTE) The Xpert SA Assay (FDA approved for NASAL specimens in patients 47 years of age and older), is one component of a comprehensive surveillance program. It is not intended to diagnose infection nor to guide or monitor treatment.          Radiology Studies: No results found.      Scheduled Meds: . aspirin  325 mg Oral Daily  . calcitRIOL  0.5 mcg Oral Daily  . carvedilol  6.25 mg Oral BID WC  . darbepoetin (ARANESP) injection - DIALYSIS  60 mcg Intravenous Q Sat-HD  . insulin aspart  0-9 Units Subcutaneous TID WC  . insulin glargine  6 Units Subcutaneous Daily  . pantoprazole  40 mg Oral Daily  .  senna-docusate  1 tablet Oral QHS  . simvastatin  40 mg Oral q1800   Continuous Infusions: . ferric gluconate (FERRLECIT/NULECIT) IV Stopped (03/19/17 1145)     LOS: 8 days    Keiry Kowal Tanna Furry, MD [facility] Pager [phone]  If 7PM-7AM, please contact night-coverage [email or web address] Password [email or web address] 03/20/2017, 1:07 PM
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
## 2017-03-20 NOTE — Care Management Important Message (Signed)
Important Message  Patient Details  Name: Ashlee Miles MRN: [id number] Date of Birth: 27-Feb-1941   Medicare Important Message Given:  Yes    [name] 03/20/2017, 9:20 AM

## 2017-03-21 LAB — GLUCOSE, CAPILLARY
Glucose-Capillary: 107 mg/dL — ABNORMAL HIGH (ref 65–99)
Glucose-Capillary: 129 mg/dL — ABNORMAL HIGH (ref 65–99)
Glucose-Capillary: 136 mg/dL — ABNORMAL HIGH (ref 65–99)
Glucose-Capillary: 72 mg/dL (ref 65–99)
Glucose-Capillary: 139 mg/dL — ABNORMAL HIGH (ref 65–99)
Glucose-Capillary: 137 mg/dL — ABNORMAL HIGH (ref 65–99)
Glucose-Capillary: 188 mg/dL — ABNORMAL HIGH (ref 65–99)

## 2017-03-21 MED ORDER — DOXERCALCIFEROL 4 MCG/2ML IV SOLN
2.0000 ug | INTRAVENOUS | Status: DC
  Filled 2017-03-21: qty 2
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
## 2017-03-21 NOTE — Progress Notes (Addendum)
PROGRESS NOTE    Ashlee Miles  XIP:[id number] DOB: 1941/05/17 DOA: 03/12/2017 PCP: Darlina Rumpf, MD   Brief Narrative: 76 year old female with history of hypertension, type 2 diabetes, chronic back pain, chronic kidney disease stage IV presented with episode of syncope at home. Patient also with worsening mental status over last 1 week. Patient was hypoglycemic on admission. Worsening renal failure with hyperkalemia. Admitted for further evaluation. Patient was recently admitted for TIA workup.  Patient is medically stable. Receiving hemodialysis today. Continue current medical and supportive care. Discussed with the nephrology and dialysis nurse, waiting for outpatient hemodialysis center set up.  Social worker for a skilled nursing home placement. No change in medical management today.  Assessment & Plan:  # Acute encephalopathy likely uremic/metabolic in the setting of renal failure and multiple pain medications. -Mental status improved. -CT head with no acute finding -PT OT evaluation recommended skilled nursing facility. Social worker evaluation ongoing.  #Progressive CKD IV with uremic symptoms, now ESRD - Ultrasound of kidneys with no hydronephrosis or acute finding.  -Status post right femoral tunneled catheter placement and placement of left thigh AVG by vascular on 9/27 and initiated dialysis. -Patient is tolerating hemodialysis well. Outpatient hemodialysis was arranged starting from Friday (10/05). Plan for next hemodialysis tomorrow in the hospital and can be discharged if skilled nursing home placement arranged by social worker. I have discussed with the case manager during progressive round today.  #Anemia of chronic kidney disease: Iron and ESA as per nephrology. Monitor CBC.  #Type 2 diabetes with hypoglycemia on admission: Patient has A1c of 7.2 on 03/02/2017. -Blood sugar level is now elevated. Continue current insulin regimen. Monitor blood sugar level.  #History of  TIA: Continue aspirin, zocor  #Hypertension:  Initiated hemodialysis. Monitor blood pressure closely.  #Chronic diastolic congestive heart failure: Continue cardiac medication  # Chronic back pain: Continue Robaxin for muscle relaxant. Holding sedatives.  #Thrombocytopenia: Discontinue heparin subcutaneous. Continue SCD for DVT prophylaxis. plt 105  DVT prophylaxis: SCD Code Status: DO NOT RESUSCITATE Family Communication: No family member at bedside Disposition Plan: Discharge when a skilled nursing facility available at SNF, after tomorrow's dialysis.     Consultants:   Nephrologist  Vascular surgery  Procedures: None Antimicrobials: None  Subjective: Seen and examined at bedside. Working with physical therapy. Still has weakness. Denied headache, dizziness, nausea vomiting or chest pain.  Objective: Vitals:   03/20/17 1827 03/20/17 2036 03/21/17 0517 03/21/17 1000  BP: (!) 146/46 (!) 145/50 (!) 151/46 (!) 135/48  Pulse: 68 74 72 70  Resp: 18 18 18 18   Temp: 98.6 F (37 C) 98.4 F (36.9 C) 98 F (36.7 C) 98.4 F (36.9 C)  TempSrc: Oral Oral Oral Oral  SpO2: 100% 98% 100% 100%  Weight:      Height:        Intake/Output Summary (Last 24 hours) at 03/21/17 1314 Last data filed at 03/21/17 0900  Gross per 24 hour  Intake              470 ml  Output               25 ml  Net              445 ml   Filed Weights   03/19/17 2307 03/20/17 0820 03/20/17 1300  Weight: 112.4 kg (247 lb 14.4 oz) 107.6 kg (237 lb 3.4 oz) 105.3 kg (232 lb 2.3 oz)    Examination:  General exam: Sitting on chair comfortably, not  in distress Respiratory system: Clear bilaterally, respiratory effort normal Cardiovascular system: Regular rate rhythm S1-S2 normal. No edema Gastrointestinal system: Abdomen soft, nontender. Bowel sound positive Central nervous system: Alert awake and oriented. Skin: No rashes, lesions or ulcers Right femoral tunneled catheter and left thigh graft.  Unchanged Data Reviewed: I have personally reviewed following labs and imaging studies  CBC:  Recent Labs Lab 03/15/17 0449 03/17/17 2020 03/19/17 0542 03/20/17 0406  WBC 7.8 8.4 7.6 8.2  HGB 9.1* 8.4* 8.7* 8.7*  HCT 29.8* 26.0* 26.7* 27.1*  MCV 78.8 77.6* 76.7* 77.0*  PLT 169 113* 90* 353*   Basic Metabolic Panel:  Recent Labs Lab 03/15/17 0449 03/16/17 0416 03/17/17 0357 03/17/17 2020 03/18/17 0321 03/20/17 0406  NA 147* 145 143 138 139 139  K 5.1 4.7 4.3 4.4 4.0 3.8  CL 115* 117* 112* 108 106 108  CO2 23 23 28 23 25 24   GLUCOSE 84 103* 174* 240* 154* 123*  BUN 45* 39* 25* 27* 17 19  CREATININE 4.04* 3.54* 2.84* 3.09* 2.34* 2.78*  CALCIUM 7.8* 8.0* 8.0* 8.2* 7.9* 8.3*  PHOS 4.0 4.1 3.5 3.0 2.6  --    GFR: Estimated Creatinine Clearance: 20.3 mL/min (A) (by C-G formula based on SCr of 2.78 mg/dL (H)). Liver Function Tests:  Recent Labs Lab 03/15/17 0449 03/16/17 0416 03/17/17 0357 03/17/17 2020 03/18/17 0321  ALBUMIN 2.5* 2.2* 2.2* 2.3* 2.2*   No results for input(s): LIPASE, AMYLASE in the last 168 hours. No results for input(s): AMMONIA in the last 168 hours. Coagulation Profile: No results for input(s): INR, PROTIME in the last 168 hours. Cardiac Enzymes: No results for input(s): CKTOTAL, CKMB, CKMBINDEX, TROPONINI in the last 168 hours. BNP (last 3 results) No results for input(s): PROBNP in the last 8760 hours. HbA1C: No results for input(s): HGBA1C in the last 72 hours. CBG:  Recent Labs Lab 03/20/17 1531 03/20/17 1705 03/20/17 2039 03/21/17 0748 03/21/17 1136  GLUCAP 129* 136* 115* 72 139*   Lipid Profile: No results for input(s): CHOL, HDL, LDLCALC, TRIG, CHOLHDL, LDLDIRECT in the last 72 hours. Thyroid Function Tests: No results for input(s): TSH, T4TOTAL, FREET4, T3FREE, THYROIDAB in the last 72 hours. Anemia Panel: No results for input(s): VITAMINB12, FOLATE, FERRITIN, TIBC, IRON, RETICCTPCT in the last 72 hours. Sepsis  Labs: No results for input(s): PROCALCITON, LATICACIDVEN in the last 168 hours.  Recent Results (from the past 240 hour(s))  Urine Culture     Status: Abnormal   Collection Time: 03/13/17  4:05 PM  Result Value Ref Range Status   Specimen Description URINE, RANDOM  Final   Special Requests NONE  Final   Culture MULTIPLE SPECIES PRESENT, SUGGEST RECOLLECTION (A)  Final   Report Status 03/15/2017 FINAL  Final  Surgical pcr screen     Status: None   Collection Time: 03/15/17 10:39 PM  Result Value Ref Range Status   MRSA, PCR NEGATIVE NEGATIVE Final   Staphylococcus aureus NEGATIVE NEGATIVE Final    Comment: (NOTE) The Xpert SA Assay (FDA approved for NASAL specimens in patients 65 years of age and older), is one component of a comprehensive surveillance program. It is not intended to diagnose infection nor to guide or monitor treatment.          Radiology Studies: No results found.      Scheduled Meds: . aspirin  325 mg Oral Daily  . carvedilol  6.25 mg Oral BID WC  . darbepoetin (ARANESP) injection - DIALYSIS  60 mcg Intravenous  Q Sat-HD  . [START ON 03/22/2017] doxercalciferol  2 mcg Intravenous Q M,W,F-HD  . insulin aspart  0-9 Units Subcutaneous TID WC  . insulin glargine  6 Units Subcutaneous Daily  . pantoprazole  40 mg Oral Daily  . senna-docusate  1 tablet Oral QHS  . simvastatin  40 mg Oral q1800   Continuous Infusions: . ferric gluconate (FERRLECIT/NULECIT) IV 125 mg (03/21/17 1127)     LOS: 9 days    Lysha Schrade Tanna Furry, MD [facility] Pager [phone]  If 7PM-7AM, please contact night-coverage [email or web address] Password [email or web address] 03/21/2017, 1:14 PM

## 2017-03-22 DIAGNOSIS — N185 Chronic kidney disease, stage 5: Secondary | ICD-10-CM

## 2017-03-22 LAB — GLUCOSE, CAPILLARY
Glucose-Capillary: 84 mg/dL (ref 65–99)
Glucose-Capillary: 236 mg/dL — ABNORMAL HIGH (ref 65–99)

## 2017-03-22 LAB — CBC
HCT: 28.5 % — ABNORMAL LOW (ref 36.0–46.0)
Hemoglobin: 9.1 g/dL — ABNORMAL LOW (ref 12.0–15.0)
MCH: 24.9 pg — ABNORMAL LOW (ref 26.0–34.0)
MCHC: 31.9 g/dL (ref 30.0–36.0)
MCV: 78.1 fL (ref 78.0–100.0)
Platelets: 152 10*3/uL (ref 150–400)
RBC: 3.65 MIL/uL — ABNORMAL LOW (ref 3.87–5.11)
RDW: 15.6 % — ABNORMAL HIGH (ref 11.5–15.5)
WBC: 8.9 10*3/uL (ref 4.0–10.5)

## 2017-03-22 LAB — RENAL FUNCTION PANEL
Albumin: 2.3 g/dL — ABNORMAL LOW (ref 3.5–5.0)
Anion gap: 9 (ref 5–15)
BUN: 19 mg/dL (ref 6–20)
CO2: 27 mmol/L (ref 22–32)
Calcium: 8.3 mg/dL — ABNORMAL LOW (ref 8.9–10.3)
Chloride: 103 mmol/L (ref 101–111)
Creatinine, Ser: 2.86 mg/dL — ABNORMAL HIGH (ref 0.44–1.00)
GFR calc Af Amer: 17 mL/min — ABNORMAL LOW (ref >60)
GFR calc non Af Amer: 15 mL/min — ABNORMAL LOW (ref >60)
Glucose, Bld: 119 mg/dL — ABNORMAL HIGH (ref 65–99)
Phosphorus: 3.3 mg/dL (ref 2.5–4.6)
Potassium: 3.5 mmol/L (ref 3.5–5.1)
Sodium: 139 mmol/L (ref 135–145)

## 2017-03-22 MED ORDER — ALTEPLASE 2 MG IJ SOLR
INTRAMUSCULAR | Status: AC
  Administered 2017-03-22: 4 mg
  Filled 2017-03-22: qty 4

## 2017-03-22 MED ORDER — INSULIN ASPART 100 UNIT/ML ~~LOC~~ SOLN: 0.0000 [IU] | Freq: Three times a day (TID) | SUBCUTANEOUS | 11 refills | Status: DC

## 2017-03-22 MED ORDER — DOXERCALCIFEROL 4 MCG/2ML IV SOLN
INTRAVENOUS | Status: AC
  Filled 2017-03-22: qty 2

## 2017-03-22 MED ORDER — INSULIN GLARGINE 100 UNIT/ML ~~LOC~~ SOLN: 6.0000 [IU] | Freq: Every day | SUBCUTANEOUS | 11 refills | Status: DC
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
## 2017-03-22 NOTE — Progress Notes (Signed)
Report given to [place] at [facility], waiting for [facility] to transport pt.
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
## 2017-03-22 NOTE — Procedures (Signed)
I have personally attended this patient's dialysis session.   Currently initiating HD via [facility] Weight pending 4K bath pending labs  Jamal Maes, MD House Pager 03/22/2017, 7:41 AM

## 2017-03-22 NOTE — Progress Notes (Signed)
Attempted to call report at [facility].

## 2017-03-22 NOTE — Progress Notes (Signed)
Clinical Social Worker facilitated patient discharge including contacting patient family and facility to confirm patient discharge plans.  Clinical information faxed to facility and family agreeable with plan.  CSW arranged ambulance transport via [facility] to [facility] .  RN to call [phone] for report prior to discharge.  Clinical Social Worker will sign off for now as social work intervention is no longer needed. Please consult [place] again if new need arises.  Rhea Pink, MSW, [place]

## 2017-03-24 DIAGNOSIS — D689 Coagulation defect, unspecified: Secondary | ICD-10-CM | POA: Insufficient documentation

## 2017-03-24 DIAGNOSIS — E876 Hypokalemia: Secondary | ICD-10-CM | POA: Insufficient documentation

## 2017-04-05 ENCOUNTER — Inpatient Hospital Stay (HOSPITAL_COMMUNITY)
Admission: EM | Admit: 2017-04-05 | Discharge: 2017-04-12 | DRG: 314 | Disposition: A | Discharge status: Home Health | Payer: 59 | Attending: Internal Medicine | Admitting: Internal Medicine

## 2017-04-05 ENCOUNTER — Encounter (HOSPITAL_COMMUNITY): Payer: Self-pay

## 2017-04-05 ENCOUNTER — Emergency Department (HOSPITAL_COMMUNITY): Payer: 59

## 2017-04-05 ENCOUNTER — Other Ambulatory Visit: Payer: Self-pay | Admitting: *Deleted

## 2017-04-05 DIAGNOSIS — I82411 Acute embolism and thrombosis of right femoral vein: Secondary | ICD-10-CM | POA: Diagnosis not present

## 2017-04-05 DIAGNOSIS — Z6841 Body Mass Index (BMI) 40.0 and over, adult: Secondary | ICD-10-CM

## 2017-04-05 DIAGNOSIS — I5032 Chronic diastolic (congestive) heart failure: Secondary | ICD-10-CM | POA: Diagnosis present

## 2017-04-05 DIAGNOSIS — D696 Thrombocytopenia, unspecified: Secondary | ICD-10-CM | POA: Diagnosis present

## 2017-04-05 DIAGNOSIS — N184 Chronic kidney disease, stage 4 (severe): Secondary | ICD-10-CM

## 2017-04-05 DIAGNOSIS — I10 Essential (primary) hypertension: Secondary | ICD-10-CM

## 2017-04-05 DIAGNOSIS — Z8249 Family history of ischemic heart disease and other diseases of the circulatory system: Secondary | ICD-10-CM

## 2017-04-05 DIAGNOSIS — T82868A Thrombosis of vascular prosthetic devices, implants and grafts, initial encounter: Principal | ICD-10-CM | POA: Diagnosis present

## 2017-04-05 DIAGNOSIS — E1122 Type 2 diabetes mellitus with diabetic chronic kidney disease: Secondary | ICD-10-CM | POA: Diagnosis present

## 2017-04-05 DIAGNOSIS — Z992 Dependence on renal dialysis: Secondary | ICD-10-CM

## 2017-04-05 DIAGNOSIS — R059 Cough, unspecified: Secondary | ICD-10-CM | POA: Diagnosis present

## 2017-04-05 DIAGNOSIS — Z794 Long term (current) use of insulin: Secondary | ICD-10-CM | POA: Diagnosis not present

## 2017-04-05 DIAGNOSIS — R0602 Shortness of breath: Secondary | ICD-10-CM | POA: Diagnosis not present

## 2017-04-05 DIAGNOSIS — Z79899 Other long term (current) drug therapy: Secondary | ICD-10-CM

## 2017-04-05 DIAGNOSIS — D631 Anemia in chronic kidney disease: Secondary | ICD-10-CM | POA: Diagnosis present

## 2017-04-05 DIAGNOSIS — E876 Hypokalemia: Secondary | ICD-10-CM | POA: Diagnosis not present

## 2017-04-05 DIAGNOSIS — Z66 Do not resuscitate: Secondary | ICD-10-CM | POA: Diagnosis present

## 2017-04-05 DIAGNOSIS — N186 End stage renal disease: Secondary | ICD-10-CM | POA: Diagnosis present

## 2017-04-05 DIAGNOSIS — I82409 Acute embolism and thrombosis of unspecified deep veins of unspecified lower extremity: Secondary | ICD-10-CM | POA: Diagnosis present

## 2017-04-05 DIAGNOSIS — Z8673 Personal history of transient ischemic attack (TIA), and cerebral infarction without residual deficits: Secondary | ICD-10-CM

## 2017-04-05 DIAGNOSIS — Z4931 Encounter for adequacy testing for hemodialysis: Secondary | ICD-10-CM

## 2017-04-05 DIAGNOSIS — I953 Hypotension of hemodialysis: Secondary | ICD-10-CM | POA: Diagnosis not present

## 2017-04-05 DIAGNOSIS — E669 Obesity, unspecified: Secondary | ICD-10-CM | POA: Diagnosis present

## 2017-04-05 DIAGNOSIS — N189 Chronic kidney disease, unspecified: Secondary | ICD-10-CM | POA: Diagnosis present

## 2017-04-05 DIAGNOSIS — Z7982 Long term (current) use of aspirin: Secondary | ICD-10-CM

## 2017-04-05 DIAGNOSIS — R05 Cough: Secondary | ICD-10-CM

## 2017-04-05 DIAGNOSIS — I4891 Unspecified atrial fibrillation: Secondary | ICD-10-CM | POA: Diagnosis present

## 2017-04-05 DIAGNOSIS — D6859 Other primary thrombophilia: Secondary | ICD-10-CM | POA: Diagnosis present

## 2017-04-05 DIAGNOSIS — Z86711 Personal history of pulmonary embolism: Secondary | ICD-10-CM

## 2017-04-05 DIAGNOSIS — J449 Chronic obstructive pulmonary disease, unspecified: Secondary | ICD-10-CM | POA: Diagnosis present

## 2017-04-05 DIAGNOSIS — I132 Hypertensive heart and chronic kidney disease with heart failure and with stage 5 chronic kidney disease, or end stage renal disease: Secondary | ICD-10-CM | POA: Diagnosis present

## 2017-04-05 DIAGNOSIS — Z833 Family history of diabetes mellitus: Secondary | ICD-10-CM

## 2017-04-05 DIAGNOSIS — N2581 Secondary hyperparathyroidism of renal origin: Secondary | ICD-10-CM | POA: Diagnosis present

## 2017-04-05 DIAGNOSIS — Y832 Surgical operation with anastomosis, bypass or graft as the cause of abnormal reaction of the patient, or of later complication, without mention of misadventure at the time of the procedure: Secondary | ICD-10-CM | POA: Diagnosis present

## 2017-04-05 DIAGNOSIS — E118 Type 2 diabetes mellitus with unspecified complications: Secondary | ICD-10-CM | POA: Diagnosis present

## 2017-04-05 HISTORY — DX: Dependence on renal dialysis: Z99.2

## 2017-04-05 HISTORY — DX: Other primary thrombophilia: D68.59

## 2017-04-05 HISTORY — DX: Dependence on renal dialysis: N18.6

## 2017-04-05 LAB — CBC WITH DIFFERENTIAL/PLATELET
Basophils Absolute: 0 10*3/uL (ref 0.0–0.1)
Basophils Relative: 0 %
Eosinophils Absolute: 0.7 10*3/uL (ref 0.0–0.7)
Eosinophils Relative: 11 %
HCT: 28.7 % — ABNORMAL LOW (ref 36.0–46.0)
Hemoglobin: 9.2 g/dL — ABNORMAL LOW (ref 12.0–15.0)
Lymphocytes Relative: 29 %
Lymphs Abs: 2 10*3/uL (ref 0.7–4.0)
MCH: 25.6 pg — ABNORMAL LOW (ref 26.0–34.0)
MCHC: 32.1 g/dL (ref 30.0–36.0)
MCV: 79.9 fL (ref 78.0–100.0)
Monocytes Absolute: 0.6 10*3/uL (ref 0.1–1.0)
Monocytes Relative: 9 %
Neutro Abs: 3.5 10*3/uL (ref 1.7–7.7)
Neutrophils Relative %: 51 %
Platelets: 113 10*3/uL — ABNORMAL LOW (ref 150–400)
RBC: 3.59 MIL/uL — ABNORMAL LOW (ref 3.87–5.11)
RDW: 16.4 % — ABNORMAL HIGH (ref 11.5–15.5)
WBC: 6.8 10*3/uL (ref 4.0–10.5)

## 2017-04-05 LAB — BASIC METABOLIC PANEL
Anion gap: 9 (ref 5–15)
BUN: 11 mg/dL (ref 6–20)
CO2: 27 mmol/L (ref 22–32)
Calcium: 7.8 mg/dL — ABNORMAL LOW (ref 8.9–10.3)
Chloride: 95 mmol/L — ABNORMAL LOW (ref 101–111)
Creatinine, Ser: 2.05 mg/dL — ABNORMAL HIGH (ref 0.44–1.00)
GFR calc Af Amer: 26 mL/min — ABNORMAL LOW (ref >60)
GFR calc non Af Amer: 23 mL/min — ABNORMAL LOW (ref >60)
Glucose, Bld: 193 mg/dL — ABNORMAL HIGH (ref 65–99)
Potassium: 4 mmol/L (ref 3.5–5.1)
Sodium: 131 mmol/L — ABNORMAL LOW (ref 135–145)

## 2017-04-05 LAB — I-STAT TROPONIN, ED: Troponin i, poc: 0 ng/mL (ref 0.00–0.08)

## 2017-04-05 LAB — BRAIN NATRIURETIC PEPTIDE: B Natriuretic Peptide: 216 pg/mL — ABNORMAL HIGH (ref 0.0–100.0)

## 2017-04-05 MED ORDER — HEPARIN (PORCINE) IN NACL 100-0.45 UNIT/ML-% IJ SOLN
1200.0000 [IU]/h | INTRAMUSCULAR | Status: DC
  Administered 2017-04-05: 1200 [IU]/h via INTRAVENOUS
  Filled 2017-04-05: qty 250

## 2017-04-05 MED ORDER — HEPARIN BOLUS VIA INFUSION
4000.0000 [IU] | Freq: Once | INTRAVENOUS | Status: AC
  Administered 2017-04-05: 4000 [IU] via INTRAVENOUS
  Filled 2017-04-05: qty 4000

## 2017-04-05 MED ORDER — SODIUM CHLORIDE 0.9% FLUSH: 10.0000 mL | INTRAVENOUS | Status: DC | PRN

## 2017-04-05 MED ORDER — ONDANSETRON HCL 4 MG/2ML IJ SOLN
4.0000 mg | Freq: Once | INTRAMUSCULAR | Status: AC
  Administered 2017-04-05: 4 mg via INTRAVENOUS
  Filled 2017-04-05: qty 2

## 2017-04-05 MED ORDER — MORPHINE SULFATE (PF) 4 MG/ML IV SOLN
4.0000 mg | INTRAVENOUS | Status: DC | PRN
  Administered 2017-04-05: 4 mg via INTRAVENOUS
  Filled 2017-04-05: qty 1

## 2017-04-05 MED ORDER — IOPAMIDOL (ISOVUE-370) INJECTION 76%
INTRAVENOUS | Status: AC
  Administered 2017-04-05: 100 mL
  Filled 2017-04-05: qty 100

## 2017-04-05 NOTE — ED Notes (Signed)
CT made aware of pt's port access.  Pt continues to refuse IVs

## 2017-04-05 NOTE — ED Provider Notes (Signed)
[facility] EMERGENCY DEPARTMENT Provider Note   CSN: [id number] Arrival date & time: 04/05/17  1809     History   Chief Complaint Chief Complaint  Patient presents with  . Leg Pain  . Shortness of Breath    HPI Ashlee Miles is a 76 y.o. female with history of diabetes, CHF, ESRD with recent start of dialysis on 03/24/2017, hypertension, PE who presents with a few day history of right leg pain and swelling as well as associated cough and shortness of breath.  Patient had a Doppler ultrasound outpatient which showed DVT in the right thigh.  Patient has a temporary dialysis catheter in the right thigh placed on 03/16/2017.  Patient reports she had mild right-sided chest pain earlier today, however she does not have this anymore.  She denies any abdominal pain, nausea, vomiting.  Patient has history of DVT as well as PE.  She has an IVC filter.  She is not on any anticoagulants at this time.  HPI  Past Medical History:  Diagnosis Date  . CHF (congestive heart failure) ([place])   . Diabetes mellitus without complication ([place])   . History of pituitary adenoma   . Hypertension   . Renal disorder     Patient Active Problem List   Diagnosis Date Noted  . Pressure injury of skin 03/14/2017  . Acute encephalopathy   . Acute renal failure superimposed on stage 4 chronic kidney disease ([place]) 03/12/2017  . Acute metabolic encephalopathy 84/53/6468  . Skin lesion of hand 03/12/2017  . TIA (transient ischemic attack) 03/01/2017  . Syncope 03/01/2017  . CKD (chronic kidney disease) stage 4, GFR 15-29 ml/min (HCC) 03/01/2017  . 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 Essential hypertension 03/01/2017  . Chronic diastolic CHF (congestive heart failure) ([place]) 03/01/2017    Past Surgical History:  Procedure Laterality Date  . AV FISTULA PLACEMENT Left 03/16/2017   Procedure: INSERTION OF ARTERIOVENOUS (AV) GORE-TEX GRAFT THIGH-LEFT;  Surgeon: Angelia Mould, MD;  Location: [place];  Service: Vascular;  Laterality: Left;  . CHOLECYSTECTOMY    . DIALYSIS FISTULA CREATION     clotted off  . INSERTION OF DIALYSIS CATHETER Right 03/16/2017   Procedure: INSERTION OF RIGHT FEMORAL TUNNELED DIALYSIS CATHETER;  Surgeon: Angelia Mould, MD;  Location: [place];  Service: Vascular;  Laterality: Right;  . IVC FILTER INSERTION    . PORTACATH PLACEMENT      OB History    No data available       Home Medications    Prior to Admission medications   Medication Sig Start Date End Date Taking? Authorizing Provider  aspirin 325 MG tablet Take 1 tablet (325 mg total) by mouth daily. 03/05/17   Hosie Poisson, MD  calcitRIOL (ROCALTROL) 0.5 MCG capsule Take 0.5 mcg by mouth daily.    [provider]  carvedilol (COREG) 6.25 MG tablet Take 1 tablet (6.25 mg total) by mouth 2 (two) times daily. 03/04/17 03/04/18  Hosie Poisson, MD  cyclobenzaprine (FLEXERIL) 10 MG tablet Take 10 mg by mouth as needed for muscle spasms.    [provider]  gabapentin (NEURONTIN) 300 MG capsule Take 300 mg by mouth 3 (three) times daily.    [provider]  insulin aspart (NOVOLOG) 100 UNIT/ML injection Inject 0-9 Units into the skin 3 (three) times daily with meals. 03/22/17   Debbe Odea, MD  insulin glargine (LANTUS) 100 UNIT/ML injection Inject 0.06 mLs (6 Units total)  into the skin daily. 03/22/17   Debbe Odea, MD  linaclotide (LINZESS) 145 MCG CAPS capsule Take 145 mcg by mouth daily before breakfast.    [provider]  omeprazole (PRILOSEC) 20 MG capsule Take 20 mg by mouth daily.    [provider]  ranitidine (ZANTAC) 150 MG tablet Take 150 mg by mouth 2 (two) times daily.    [provider]  simvastatin (ZOCOR) 40 MG tablet Take 1 tablet (40 mg total) by mouth daily. 03/05/17   Hosie Poisson, MD  triamcinolone cream (KENALOG) 0.1 % Apply 1 application topically 2 (two) times daily. Use on her hand    [provider]    Family History Family History  Problem Relation Age of Onset  . Breast cancer Mother   . Diabetes Sister   . Diabetes Brother   . CAD Other   . Stroke Neg Hx     Social History Social History  Substance Use Topics  . Smoking status: Never Smoker  . Smokeless tobacco: Never Used  . Alcohol use No     Allergies   Patient has no known allergies.   Review of Systems Review of Systems  Constitutional: Negative for chills and fever.  HENT: Negative for facial swelling and sore throat.   Respiratory: Positive for cough and shortness of breath.   Cardiovascular: Positive for chest pain and leg swelling.  Gastrointestinal: Negative for abdominal pain, nausea and vomiting.  Genitourinary: Negative for dysuria.  Musculoskeletal: Negative for back pain.  Skin: Negative for rash and wound.  Neurological: Negative for headaches.  Psychiatric/Behavioral: The patient is not nervous/anxious.      Physical Exam Updated Vital Signs BP (!) 148/105 (BP Location: Right Arm)   Pulse 74   Temp 98.2 F (36.8 C) (Oral)   Resp 20   Ht 5\' 1"  (1.549 m)   Wt 104.8 kg (231 lb)   SpO2 99%   BMI 43.65 kg/m   Physical Exam  Constitutional: She appears well-developed and well-nourished. No distress.  HENT:  Head: Normocephalic and atraumatic.  Mouth/Throat: Oropharynx is clear and moist. No oropharyngeal exudate.  Eyes: Pupils are equal, round, and reactive to light. Conjunctivae are normal. Right eye exhibits no discharge. Left eye exhibits no discharge. No scleral icterus.  Neck: Normal range of motion. Neck supple. No thyromegaly present.  Cardiovascular: Normal rate, regular rhythm, normal heart sounds and intact distal pulses.  Exam reveals no gallop and no friction rub.   No murmur heard. Pulmonary/Chest: Effort normal. No stridor. No respiratory distress. She has decreased breath sounds. She has no wheezes. She has rales (faint at bases bilaterally).  Abdominal:  Soft. Bowel sounds are normal. She exhibits no distension. There is no tenderness. There is no rebound and no guarding.  Musculoskeletal: She exhibits edema.  Significant swelling to right leg from ankle to thigh; posterior tenderness; dialysis catheter present anteriorly on the R  Lymphadenopathy:    She has no cervical adenopathy.  Neurological: She is alert. Coordination normal.  Skin: Skin is warm and dry. No rash noted. She is not diaphoretic. No pallor.  Psychiatric: She has a normal mood and affect.  Nursing note and vitals reviewed.    ED Treatments / Results  Labs (all labs ordered are listed, but only abnormal results are displayed) Labs Reviewed  BASIC METABOLIC PANEL  CBC WITH DIFFERENTIAL/PLATELET  BRAIN NATRIURETIC PEPTIDE  HEPARIN LEVEL (UNFRACTIONATED)  CBC  I-STAT TROPONIN, ED    EKG  EKG Interpretation None  Radiology No results found.  Procedures Procedures (including critical care time)  Medications Ordered in ED Medications  heparin bolus via infusion 4,000 Units (not administered)  heparin ADULT infusion 100 units/mL (25000 units/291mL sodium chloride 0.45%) (not administered)  sodium chloride flush (NS) 0.9 % injection 10-40 mL (not administered)     Initial Impression / Assessment and Plan / ED Course  I have reviewed the triage vital signs and the nursing notes.  Pertinent labs & imaging results that were available during my care of the patient were reviewed by me and considered in my medical decision making (see chart for details).     Patient was diagnosed with DVT in the right leg with outpatient ultrasound.  Patient also with cough, shortness of breath, intermittent chest pain.  Concern for PE.  I consulted nephrologist, Dr. Posey Pronto, who recommended heparin drip to bridge to Coumadin with hospitalist admission.  He also advised that we could do a CT angio of the chest to assess for PE despite new initiation of dialysis. CT angio, CXR  are pending, as well as labs. At shift change, patient care transferred to Dr. Jeneen Rinks for continued evaluation, follow up of labs, imaging and plan to admit. Heparin drip initiated in the ED.     Final Clinical Impressions(s) / ED Diagnoses   Final diagnoses:  Acute deep vein thrombosis (DVT) of femoral vein of right lower extremity (HCC)  Shortness of breath    New Prescriptions New Prescriptions   No medications on file         Caryl Ada 04/05/17 Celesta Aver, MD 04/05/17 2257

## 2017-04-05 NOTE — ED Notes (Signed)
Pt transported to CT ?

## 2017-04-05 NOTE — ED Notes (Signed)
MD at bedside updating patient/family 

## 2017-04-05 NOTE — Progress Notes (Signed)
ANTICOAGULATION CONSULT NOTE - Initial Consult  Pharmacy Consult for heparin dosing Indication: DVT  No Known Allergies  Patient Measurements: Height: 5\' 1"  (154.9 cm) Weight: 231 lb (104.8 kg) IBW/kg (Calculated) : 47.8 Heparin Dosing Weight: 73.3  Vital Signs Temp: 98.2 F (36.8 C) (10/17 1827) Temp Source: Oral (10/17 1827) BP: 148/105 (10/17 1827) Pulse Rate: 74 (10/17 1827)  Labs: No results for input(s): HGB, HCT, PLT, APTT, LABPROT, INR, HEPARINUNFRC, HEPRLOWMOCWT, CREATININE, CKTOTAL, CKMB, TROPONINI in the last 72 hours.  Estimated Creatinine Clearance: 18.9 mL/min (A) (by C-G formula based on SCr of 2.86 mg/dL (H)).   Medical History: Past Medical History:  Diagnosis Date  . CHF (congestive heart failure) ([place])   . Diabetes mellitus without complication ([place])   . History of pituitary adenoma   . Hypertension   . Renal disorder        Assessment: 25 YOF admitted from imaging facility with DVT in right thigh. Of note, had stent placed last Friday in left thigh. Not anticoag PTA, no bleeding reported. ESRD MWF. CBC pending- previous HGB 8.7-9.1, Hct 26.7-28.5, Plt 90-152.   Goal of Therapy:  Heparin level 0.3-0.7 units/ml Monitor platelets by anticoagulation protocol: Yes   Plan:  Give 4000 units bolus x 1 Start heparin infusion at 1200 units/hr  8 hour heparin level, daily heparin level  and CBC Monitor for s/sx of bleeding.   Jerrye Noble, PharmD Candidate 04/05/2017,6:59 PM

## 2017-04-05 NOTE — H&P (Signed)
History and Physical    [name] MOQ:[id number] DOB: 20-Mar-1941 DOA: 04/05/2017  PCP: None - has not had the chance to establish with PCP Consultants:  Nephrology Patient coming from: Will be living with her daughter; Donald Prose: Daughter, [phone], [phone], [phone]   Chief Complaint: leg swelling and pain  HPI: Ashlee Miles is a 76 y.o. female with medical history significant of ESRD (newly diagnosed and started on HD), DM2, HTN, chronic back pain, anemia, and recent TIA presenting with R leg swelling/pain.  She was at dialysis today and they called her daughter.  The nurse said that her leg was swollen and painful.  US showed DVT in the right femoral vein where her temporary dialysis catheter is located.  They sent her via ambulance to [facility].    She has an AV graft that is maturing in her right leg.  She has temporary venous access in the right femoral vein.  She is on  MWF HD.  Her leg started bothering her ever since the procedure.  It has not changed or gotten more red or swollen.    She has had cough since last week.  She was started on Mucinex with improvement for just a couple of hours.  Prior rhinorrhea but none recently.  No reflux noted.  She has an IVC filter in place.  She moved here after [facility].  Came on a Tuesday and has been sick since.  Admitted initially from 9/12-15 for TIA/syncope.  Discharged to her daughter's house and admitted again from 9/23-10/3 for acute metabolic encephalopathy and progressive renal failure.  She started HD and was discharged to [facility] and has been there for 2-3 weeks.    ED Course: d/w Dr. Posey Pronto who suggests admission for IV heparin and consideration of removal of catheter.  Given IV heparin bolus and infusion.  CTA negative for PE.  Review of Systems:  As per HPI; otherwise review of systems reviewed and negative.   Ambulatory Status:  Non-ambulatory at this time  Past Medical History:  Diagnosis Date   . CHF (congestive heart failure) ([place])   . Diabetes mellitus without complication ([place])   . ESRD (end stage renal disease) on dialysis ([place])   . History of pituitary adenoma   . Hypertension     Past Surgical History:  Procedure Laterality Date  . AV FISTULA PLACEMENT Left 03/16/2017   Procedure: INSERTION OF ARTERIOVENOUS (AV) GORE-TEX GRAFT THIGH-LEFT;  Surgeon: Angelia Mould, MD;  Location: [facility];  Service: Vascular;  Laterality: Left;  . CHOLECYSTECTOMY    . DIALYSIS FISTULA CREATION     clotted off  . INSERTION OF DIALYSIS CATHETER Right 03/16/2017   Procedure: INSERTION OF RIGHT FEMORAL TUNNELED DIALYSIS CATHETER;  Surgeon: Angelia Mould, MD;  Location: [facility];  Service: Vascular;  Laterality: Right;  . IVC FILTER INSERTION    . PORTACATH PLACEMENT      Social History   Social History  . Marital status: Married    Spouse name: N/A  . Number of children: N/A  . Years of education: N/A   Occupational History  . retired    Social History Main Topics  . Smoking status: Never Smoker  . Smokeless tobacco: Never Used  . Alcohol use No  . Drug use: No  . Sexual activity: Not on file   Other Topics Concern  . Not on file   Social History Narrative  . No narrative on file    No Known Allergies  Family History  Problem Relation Age of Onset  . Breast cancer Mother   . Diabetes Sister   . Diabetes Brother   . CAD Other   . Stroke Neg Hx     Prior to Admission medications   Medication Sig Start Date End Date Taking? Authorizing Provider  aspirin 325 MG tablet Take 1 tablet (325 mg total) by mouth daily. 03/05/17   Hosie Poisson, MD  calcitRIOL (ROCALTROL) 0.5 MCG capsule Take 0.5 mcg by mouth daily.    [provider]  carvedilol (COREG) 6.25 MG tablet Take 1 tablet (6.25 mg total) by mouth 2 (two) times daily. 03/04/17 03/04/18  Hosie Poisson, MD  cyclobenzaprine (FLEXERIL) 10 MG tablet Take 10 mg by mouth as needed for muscle spasms.     [provider]  gabapentin (NEURONTIN) 300 MG capsule Take 300 mg by mouth 3 (three) times daily.    [provider]  insulin aspart (NOVOLOG) 100 UNIT/ML injection Inject 0-9 Units into the skin 3 (three) times daily with meals. 03/22/17   Debbe Odea, MD  insulin glargine (LANTUS) 100 UNIT/ML injection Inject 0.06 mLs (6 Units total) into the skin daily. 03/22/17   Debbe Odea, MD  linaclotide (LINZESS) 145 MCG CAPS capsule Take 145 mcg by mouth daily before breakfast.    [provider]  omeprazole (PRILOSEC) 20 MG capsule Take 20 mg by mouth daily.    [provider]  ranitidine (ZANTAC) 150 MG tablet Take 150 mg by mouth 2 (two) times daily.    [provider]  simvastatin (ZOCOR) 40 MG tablet Take 1 tablet (40 mg total) by mouth daily. 03/05/17   Hosie Poisson, MD  triamcinolone cream (KENALOG) 0.1 % Apply 1 application topically 2 (two) times daily. Use on her hand    [provider]    Physical Exam: Vitals:   04/05/17 2145 04/05/17 2245 04/06/17 0030 04/06/17 0100  BP: (!) 113/33 (!) 129/44 (!) 102/33 (!) 104/39  Pulse: 71 78 73 74  Resp: 11 18 16 15   Temp:      TempSrc:      SpO2: 100% 98% 97% 100%  Weight:      Height:         General:  Appears calm and comfortable and is NAD Eyes:  PERRL, EOMI, normal lids, iris ENT:  grossly normal hearing, lips & tongue, mmm Neck:  no LAD, masses or thyromegaly Cardiovascular:  RRR, no m/r/g. No LE edema.  Respiratory:   CTA bilaterally with no rales/rhonchi but scattered wheezes noted.  Normal respiratory effort but patient with a dry cough frequently during evaluation. Abdomen:  soft, NT, ND, NABS Skin: RLE with diffuse erythema and edema Musculoskeletal:  grossly normal tone BUE/BLE, good ROM, no bony abnormality Psychiatric:  grossly normal mood and affect, speech fluent and appropriate, AOx3 Neurologic:  CN 2-12 grossly intact, moves all extremities in coordinated fashion,  sensation intact    Radiological Exams on Admission: Dg Chest 2 View  Result Date: 04/05/2017 CLINICAL DATA:  Cough and shortness of Breath or several days EXAM: CHEST  2 VIEW COMPARISON:  03/12/2017 FINDINGS: Right chest wall port is again identified and stable. Cardiac shadow is enlarged but stable. Postsurgical changes in the cervical spine are seen. The overall inspiratory effort is poor although no focal infiltrate or sizable effusion is seen. Vascular stenting is again seen in the left arm. IMPRESSION: Poor inspiratory effort without acute abnormality. Electronically Signed   By: Linus Mako.D.  On: 04/05/2017 19:59   Ct Angio Chest Pe W And/or Wo Contrast  Result Date: 04/05/2017 CLINICAL DATA:  Recently diagnosed with deep venous thrombosis in the right thigh. Now complains of chest pain and shortness of breath for 3 days. Positive D-dimer history of previous pulmonary embolus, hypertension, diabetes. EXAM: CT ANGIOGRAPHY CHEST WITH CONTRAST TECHNIQUE: Multidetector CT imaging of the chest was performed using the standard protocol during bolus administration of intravenous contrast. Multiplanar CT image reconstructions and MIPs were obtained to evaluate the vascular anatomy. CONTRAST:  50ccs isovue 370 given. Pt on dialysis <51month, contrast approved by Dr. JJeneen Rinksand Dr. PPosey ProntoCOMPARISON:  None. FINDINGS: Cardiovascular: Moderately good opacification of the central and segmental pulmonary arteries. No filling defects identified. No evidence of significant pulmonary embolus. Mild cardiac enlargement. No pericardial effusion. Coronary artery calcifications. Normal caliber thoracic aorta. No aortic dissection. Great vessel origins are patent. Scattered calcifications in the aorta. Mediastinum/Nodes: Esophagus is decompressed. No significant lymphadenopathy in the chest. Thyroid gland is unremarkable. Lungs/Pleura: Motion artifact limits evaluation of the lungs. No focal consolidation is  identified. Probable atelectasis in the lung bases. No pleural effusions. No pneumothorax. Airways appear patent. Upper Abdomen: Surgical absence of the gallbladder. No bile duct dilatation. Kidneys are partially visualized but appear diffusely atrophic. There is a catheter in the inferior vena cava with tip in the intrahepatic vena cava. Musculoskeletal: Degenerative changes in the thoracic spine. Normal alignment. Diffusely heterogeneous appearance of bone matrix with mixed sclerosis and lucent changes throughout. 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 No vertebral compression fractures. Review of the MIP images confirms the above findings. IMPRESSION: 1. No evidence of significant pulmonary embolus. 2. No evidence of active pulmonary disease. Atelectasis in the lung bases. 3. Aortic atherosclerosis. 4. 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 5. Central venous catheter from inferior approach with tip in the intrahepatic inferior vena cava. Aortic Atherosclerosis (ICD10-I70.0). Electronically Signed   By: WLucienne CapersM.D.   On: 04/05/2017 21:48    EKG: Independently reviewed.  NSR with rate 72;  no evidence of acute ischemia   Labs on Admission: I have personally reviewed the available labs and imaging studies at the time of the admission.  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 (HCC)   Essential hypertension   ESRD on dialysis (HCC)   Anemia due to chronic kidney disease   Thrombocytopenia (HCC)   Cough in adult   DVT -Patient with prior IVC filter placement presenting  with new DVT -She does not have a PE on CTA -DVT appears to have resulted from temporary dialysis catheter in the right femoral vein; unfortunately, her AV fistula is not yet mature -Will observe on med surg -Initiate anticoagulation - for now, will start Heparin drip -[facility] -She is not a great candidate for Coumadin since she has not yet established with PCP -Will request CM assistance in determining which novel anticoagulant will be most cost-effective for her -Will need to determine alternate access route, but will defer to nephrology regarding this issue  Cough -  Patient with recurrent cough during our interview and physical -Lungs with scattered wheezing  -Her daughter reports that she has been told in the past she has COPD -Will start Duonebs standing and prn albuterol -If ineffective, consider vocal cord dysfunction as the cause for her cough  ESRD on HD --Patient on chronic MWF HD -Nephrology prn order set utilized -She does not appear to be volume overloaded or otherwise in need of acute HD -She received her HD today and so will not need it again until Friday  HTN -Continue Coreg  DM -A1c on 9/25 was 7.3 -Continue Lantus -Cover with sensitive scale SSI  Anemia/thrombocytopenia -Appears to be stable -Will follow  DVT prophylaxis: Heparin drip Code Status:  DNR - confirmed with patient/family Family Communication: Daughter present throughout evaluation Disposition Plan:  Home once clinically improved - while she came in from a facility, her daughter plans to take her home with her after this admission Consults called: CM; nephrology to see in AM Admission status: It is my clinical opinion that referral for OBSERVATION is reasonable and necessary in this patient based on the above information provided. The aforementioned taken together are felt to place the patient at high risk for further clinical deterioration.  However it is anticipated that the patient may be medically stable for discharge from the hospital within 24 to 48 hours.    Karmen Bongo MD [facility]  If note is complete, please contact covering daytime or nighttime physician. [email or web address] Password [email or web address]  04/06/2017, 1:46 AM

## 2017-04-05 NOTE — Progress Notes (Signed)
Patient in ER asked to check out PAC. Right chest PAC was accessed with a date of 9/30 on dressing , tubing with old blood noted in caps. PAC deaccessed ,site WNL . reaccessed port per policy with good blood return noted .

## 2017-04-05 NOTE — ED Notes (Signed)
This RN went in to attempt to look for IV.  PT's family member and patient refusing any sticks.  Stating she has a port for that need.  When this RN went to assess port site for accessing, this RN noted port was still accessed.  Will reach out to IV team for access/assessment.

## 2017-04-05 NOTE — ED Triage Notes (Signed)
Pt from imaging facility bib [facility] due positive DVT right thigh. Pt states they put a stent in on last Friday and yesterday woke up with a swollen leg that was painful. Pt also has a productive cough that has been going on for 3 days.

## 2017-04-05 NOTE — ED Notes (Addendum)
Pt back from radiology 

## 2017-04-05 NOTE — ED Notes (Signed)
Admitting at bedside 

## 2017-04-06 ENCOUNTER — Encounter (HOSPITAL_COMMUNITY): Payer: Self-pay | Admitting: Internal Medicine

## 2017-04-06 ENCOUNTER — Inpatient Hospital Stay (HOSPITAL_COMMUNITY): Admission: RE | Admit: 2017-04-06 | Payer: Medicare Other | Source: Ambulatory Visit

## 2017-04-06 DIAGNOSIS — R05 Cough: Secondary | ICD-10-CM | POA: Diagnosis not present

## 2017-04-06 DIAGNOSIS — T82868A Thrombosis of vascular prosthetic devices, implants and grafts, initial encounter: Secondary | ICD-10-CM | POA: Diagnosis present

## 2017-04-06 DIAGNOSIS — D696 Thrombocytopenia, unspecified: Secondary | ICD-10-CM | POA: Diagnosis present

## 2017-04-06 DIAGNOSIS — Z7982 Long term (current) use of aspirin: Secondary | ICD-10-CM | POA: Diagnosis not present

## 2017-04-06 DIAGNOSIS — I5032 Chronic diastolic (congestive) heart failure: Secondary | ICD-10-CM | POA: Diagnosis present

## 2017-04-06 DIAGNOSIS — Z6841 Body Mass Index (BMI) 40.0 and over, adult: Secondary | ICD-10-CM | POA: Diagnosis not present

## 2017-04-06 DIAGNOSIS — D6859 Other primary thrombophilia: Secondary | ICD-10-CM | POA: Diagnosis present

## 2017-04-06 DIAGNOSIS — N184 Chronic kidney disease, stage 4 (severe): Secondary | ICD-10-CM | POA: Diagnosis not present

## 2017-04-06 DIAGNOSIS — Z8673 Personal history of transient ischemic attack (TIA), and cerebral infarction without residual deficits: Secondary | ICD-10-CM | POA: Diagnosis not present

## 2017-04-06 DIAGNOSIS — I82411 Acute embolism and thrombosis of right femoral vein: Secondary | ICD-10-CM | POA: Diagnosis present

## 2017-04-06 DIAGNOSIS — E1122 Type 2 diabetes mellitus with diabetic chronic kidney disease: Secondary | ICD-10-CM | POA: Diagnosis present

## 2017-04-06 DIAGNOSIS — I82409 Acute embolism and thrombosis of unspecified deep veins of unspecified lower extremity: Secondary | ICD-10-CM | POA: Diagnosis not present

## 2017-04-06 DIAGNOSIS — Z833 Family history of diabetes mellitus: Secondary | ICD-10-CM | POA: Diagnosis not present

## 2017-04-06 DIAGNOSIS — I132 Hypertensive heart and chronic kidney disease with heart failure and with stage 5 chronic kidney disease, or end stage renal disease: Secondary | ICD-10-CM | POA: Diagnosis present

## 2017-04-06 DIAGNOSIS — Y832 Surgical operation with anastomosis, bypass or graft as the cause of abnormal reaction of the patient, or of later complication, without mention of misadventure at the time of the procedure: Secondary | ICD-10-CM | POA: Diagnosis present

## 2017-04-06 DIAGNOSIS — Z66 Do not resuscitate: Secondary | ICD-10-CM | POA: Diagnosis present

## 2017-04-06 DIAGNOSIS — Z79899 Other long term (current) drug therapy: Secondary | ICD-10-CM | POA: Diagnosis not present

## 2017-04-06 DIAGNOSIS — I824Y1 Acute embolism and thrombosis of unspecified deep veins of right proximal lower extremity: Secondary | ICD-10-CM | POA: Diagnosis not present

## 2017-04-06 DIAGNOSIS — Z992 Dependence on renal dialysis: Secondary | ICD-10-CM

## 2017-04-06 DIAGNOSIS — Z86711 Personal history of pulmonary embolism: Secondary | ICD-10-CM | POA: Diagnosis not present

## 2017-04-06 DIAGNOSIS — D631 Anemia in chronic kidney disease: Secondary | ICD-10-CM | POA: Diagnosis present

## 2017-04-06 DIAGNOSIS — N2581 Secondary hyperparathyroidism of renal origin: Secondary | ICD-10-CM | POA: Diagnosis present

## 2017-04-06 DIAGNOSIS — R0602 Shortness of breath: Secondary | ICD-10-CM | POA: Diagnosis present

## 2017-04-06 DIAGNOSIS — E876 Hypokalemia: Secondary | ICD-10-CM | POA: Diagnosis not present

## 2017-04-06 DIAGNOSIS — N186 End stage renal disease: Secondary | ICD-10-CM | POA: Diagnosis present

## 2017-04-06 DIAGNOSIS — N189 Chronic kidney disease, unspecified: Secondary | ICD-10-CM | POA: Diagnosis present

## 2017-04-06 DIAGNOSIS — Z8249 Family history of ischemic heart disease and other diseases of the circulatory system: Secondary | ICD-10-CM | POA: Diagnosis not present

## 2017-04-06 DIAGNOSIS — E669 Obesity, unspecified: Secondary | ICD-10-CM | POA: Diagnosis present

## 2017-04-06 DIAGNOSIS — Z794 Long term (current) use of insulin: Secondary | ICD-10-CM | POA: Diagnosis not present

## 2017-04-06 DIAGNOSIS — R059 Cough, unspecified: Secondary | ICD-10-CM | POA: Diagnosis present

## 2017-04-06 DIAGNOSIS — J449 Chronic obstructive pulmonary disease, unspecified: Secondary | ICD-10-CM | POA: Diagnosis present

## 2017-04-06 DIAGNOSIS — I4891 Unspecified atrial fibrillation: Secondary | ICD-10-CM | POA: Diagnosis present

## 2017-04-06 DIAGNOSIS — I10 Essential (primary) hypertension: Secondary | ICD-10-CM | POA: Diagnosis not present

## 2017-04-06 LAB — CBC
HCT: 26.7 % — ABNORMAL LOW (ref 36.0–46.0)
Hemoglobin: 8.3 g/dL — ABNORMAL LOW (ref 12.0–15.0)
MCH: 25 pg — ABNORMAL LOW (ref 26.0–34.0)
MCHC: 31.1 g/dL (ref 30.0–36.0)
MCV: 80.4 fL (ref 78.0–100.0)
Platelets: 123 10*3/uL — ABNORMAL LOW (ref 150–400)
RBC: 3.32 MIL/uL — ABNORMAL LOW (ref 3.87–5.11)
RDW: 15.9 % — ABNORMAL HIGH (ref 11.5–15.5)
WBC: 6.5 10*3/uL (ref 4.0–10.5)

## 2017-04-06 LAB — BASIC METABOLIC PANEL
Anion gap: 7 (ref 5–15)
BUN: 15 mg/dL (ref 6–20)
CO2: 30 mmol/L (ref 22–32)
Calcium: 8.1 mg/dL — ABNORMAL LOW (ref 8.9–10.3)
Chloride: 98 mmol/L — ABNORMAL LOW (ref 101–111)
Creatinine, Ser: 2.93 mg/dL — ABNORMAL HIGH (ref 0.44–1.00)
GFR calc Af Amer: 17 mL/min — ABNORMAL LOW (ref >60)
GFR calc non Af Amer: 15 mL/min — ABNORMAL LOW (ref >60)
Glucose, Bld: 174 mg/dL — ABNORMAL HIGH (ref 65–99)
Potassium: 4.5 mmol/L (ref 3.5–5.1)
Sodium: 135 mmol/L (ref 135–145)

## 2017-04-06 LAB — HEPARIN LEVEL (UNFRACTIONATED)
Heparin Unfractionated: 1.02 IU/mL — ABNORMAL HIGH (ref 0.30–0.70)
Heparin Unfractionated: 1.16 IU/mL — ABNORMAL HIGH (ref 0.30–0.70)
Heparin Unfractionated: 0.56 IU/mL (ref 0.30–0.70)

## 2017-04-06 LAB — CBG MONITORING, ED
Glucose-Capillary: 199 mg/dL — ABNORMAL HIGH (ref 65–99)
Glucose-Capillary: 189 mg/dL — ABNORMAL HIGH (ref 65–99)
Glucose-Capillary: 125 mg/dL — ABNORMAL HIGH (ref 65–99)

## 2017-04-06 LAB — GLUCOSE, CAPILLARY
Glucose-Capillary: 242 mg/dL — ABNORMAL HIGH (ref 65–99)
Glucose-Capillary: 208 mg/dL — ABNORMAL HIGH (ref 65–99)

## 2017-04-06 MED ORDER — CAMPHOR-MENTHOL 0.5-0.5 % EX LOTN: 1.0000 "application " | TOPICAL_LOTION | Freq: Three times a day (TID) | CUTANEOUS | Status: DC | PRN

## 2017-04-06 MED ORDER — LINACLOTIDE 145 MCG PO CAPS
145.0000 ug | ORAL_CAPSULE | Freq: Every day | ORAL | Status: DC
  Administered 2017-04-07 – 2017-04-12 (×5): 145 ug via ORAL
  Filled 2017-04-06 (×9): qty 1

## 2017-04-06 MED ORDER — INSULIN ASPART 100 UNIT/ML ~~LOC~~ SOLN
0.0000 [IU] | Freq: Three times a day (TID) | SUBCUTANEOUS | Status: DC
  Administered 2017-04-06: 1 [IU] via SUBCUTANEOUS
  Administered 2017-04-06: 3 [IU] via SUBCUTANEOUS
  Administered 2017-04-06 – 2017-04-07 (×2): 2 [IU] via SUBCUTANEOUS
  Administered 2017-04-07: 3 [IU] via SUBCUTANEOUS
  Administered 2017-04-08 – 2017-04-09 (×5): 2 [IU] via SUBCUTANEOUS
  Administered 2017-04-09 – 2017-04-10 (×2): 3 [IU] via SUBCUTANEOUS
  Administered 2017-04-10 (×2): 2 [IU] via SUBCUTANEOUS
  Administered 2017-04-11 (×2): 3 [IU] via SUBCUTANEOUS
  Administered 2017-04-11: 5 [IU] via SUBCUTANEOUS
  Administered 2017-04-12: 2 [IU] via SUBCUTANEOUS
  Administered 2017-04-12: 3 [IU] via SUBCUTANEOUS
  Filled 2017-04-06 (×2): qty 1

## 2017-04-06 MED ORDER — CYCLOBENZAPRINE HCL 10 MG PO TABS
10.0000 mg | ORAL_TABLET | ORAL | Status: DC | PRN
  Administered 2017-04-07 – 2017-04-10 (×3): 10 mg via ORAL
  Filled 2017-04-06 (×3): qty 1

## 2017-04-06 MED ORDER — FAMOTIDINE 20 MG PO TABS
20.0000 mg | ORAL_TABLET | Freq: Two times a day (BID) | ORAL | Status: DC
  Administered 2017-04-06 (×2): 20 mg via ORAL
  Filled 2017-04-06 (×2): qty 1

## 2017-04-06 MED ORDER — ACETAMINOPHEN 325 MG PO TABS
650.0000 mg | ORAL_TABLET | Freq: Four times a day (QID) | ORAL | Status: DC | PRN
  Administered 2017-04-07 – 2017-04-12 (×5): 650 mg via ORAL
  Filled 2017-04-06 (×3): qty 2

## 2017-04-06 MED ORDER — ASPIRIN 325 MG PO TABS
325.0000 mg | ORAL_TABLET | Freq: Every day | ORAL | Status: DC
  Administered 2017-04-06 – 2017-04-12 (×7): 325 mg via ORAL
  Filled 2017-04-06 (×7): qty 1

## 2017-04-06 MED ORDER — ONDANSETRON HCL 4 MG PO TABS: 4.0000 mg | ORAL_TABLET | Freq: Four times a day (QID) | ORAL | Status: DC | PRN

## 2017-04-06 MED ORDER — CALCIUM CARBONATE ANTACID 1250 MG/5ML PO SUSP
500.0000 mg | Freq: Four times a day (QID) | ORAL | Status: DC | PRN
  Filled 2017-04-06: qty 5

## 2017-04-06 MED ORDER — SORBITOL 70 % SOLN
30.0000 mL | Status: DC | PRN
  Filled 2017-04-06: qty 30

## 2017-04-06 MED ORDER — INSULIN ASPART 100 UNIT/ML ~~LOC~~ SOLN
0.0000 [IU] | Freq: Every day | SUBCUTANEOUS | Status: DC
Start: 2017-04-06 — End: 2017-04-12
  Administered 2017-04-06: 2 [IU] via SUBCUTANEOUS
  Administered 2017-04-09 – 2017-04-11 (×2): 3 [IU] via SUBCUTANEOUS

## 2017-04-06 MED ORDER — GABAPENTIN 300 MG PO CAPS
300.0000 mg | ORAL_CAPSULE | Freq: Three times a day (TID) | ORAL | Status: DC
  Administered 2017-04-06 – 2017-04-12 (×19): 300 mg via ORAL
  Filled 2017-04-06 (×19): qty 1

## 2017-04-06 MED ORDER — INSULIN GLARGINE 100 UNIT/ML ~~LOC~~ SOLN
6.0000 [IU] | Freq: Every day | SUBCUTANEOUS | Status: DC
  Administered 2017-04-06 – 2017-04-10 (×5): 6 [IU] via SUBCUTANEOUS
  Filled 2017-04-06 (×7): qty 0.06

## 2017-04-06 MED ORDER — IPRATROPIUM-ALBUTEROL 0.5-2.5 (3) MG/3ML IN SOLN
3.0000 mL | RESPIRATORY_TRACT | Status: DC | PRN
  Administered 2017-04-10 – 2017-04-11 (×2): 3 mL via RESPIRATORY_TRACT
  Filled 2017-04-06 (×2): qty 3

## 2017-04-06 MED ORDER — NEPRO/CARBSTEADY PO LIQD
237.0000 mL | Freq: Three times a day (TID) | ORAL | Status: DC | PRN
  Filled 2017-04-06: qty 237

## 2017-04-06 MED ORDER — CALCITRIOL 0.5 MCG PO CAPS
0.5000 ug | ORAL_CAPSULE | Freq: Every day | ORAL | Status: DC
  Filled 2017-04-06: qty 1

## 2017-04-06 MED ORDER — DOXERCALCIFEROL 4 MCG/2ML IV SOLN
2.0000 ug | INTRAVENOUS | Status: DC
  Administered 2017-04-07 – 2017-04-12 (×3): 2 ug via INTRAVENOUS
  Filled 2017-04-06 (×3): qty 2

## 2017-04-06 MED ORDER — SIMVASTATIN 40 MG PO TABS
40.0000 mg | ORAL_TABLET | Freq: Every day | ORAL | Status: DC
  Administered 2017-04-06 – 2017-04-12 (×7): 40 mg via ORAL
  Filled 2017-04-06 (×8): qty 1

## 2017-04-06 MED ORDER — DOCUSATE SODIUM 100 MG PO CAPS
100.0000 mg | ORAL_CAPSULE | Freq: Two times a day (BID) | ORAL | Status: DC
  Administered 2017-04-06 – 2017-04-12 (×14): 100 mg via ORAL
  Filled 2017-04-06 (×14): qty 1

## 2017-04-06 MED ORDER — IPRATROPIUM-ALBUTEROL 0.5-2.5 (3) MG/3ML IN SOLN
3.0000 mL | Freq: Four times a day (QID) | RESPIRATORY_TRACT | Status: DC
  Administered 2017-04-06 (×2): 3 mL via RESPIRATORY_TRACT
  Filled 2017-04-06 (×2): qty 3

## 2017-04-06 MED ORDER — ACETAMINOPHEN 650 MG RE SUPP: 650.0000 mg | Freq: Four times a day (QID) | RECTAL | Status: DC | PRN

## 2017-04-06 MED ORDER — PANTOPRAZOLE SODIUM 40 MG PO TBEC
40.0000 mg | DELAYED_RELEASE_TABLET | Freq: Every day | ORAL | Status: DC
  Administered 2017-04-06 – 2017-04-12 (×7): 40 mg via ORAL
  Filled 2017-04-06 (×7): qty 1

## 2017-04-06 MED ORDER — HYDROMORPHONE HCL 1 MG/ML IJ SOLN
0.5000 mg | INTRAMUSCULAR | Status: DC | PRN
  Administered 2017-04-06 – 2017-04-08 (×4): 0.5 mg via INTRAVENOUS
  Filled 2017-04-06 (×2): qty 1
  Filled 2017-04-06 (×2): qty 0.5

## 2017-04-06 MED ORDER — DOCUSATE SODIUM 283 MG RE ENEM
1.0000 | ENEMA | RECTAL | Status: DC | PRN
  Filled 2017-04-06: qty 1

## 2017-04-06 MED ORDER — CARVEDILOL 6.25 MG PO TABS
6.2500 mg | ORAL_TABLET | Freq: Two times a day (BID) | ORAL | Status: DC
  Administered 2017-04-06 – 2017-04-11 (×12): 6.25 mg via ORAL
  Filled 2017-04-06 (×13): qty 1

## 2017-04-06 MED ORDER — DARBEPOETIN ALFA 100 MCG/0.5ML IJ SOSY
100.0000 ug | PREFILLED_SYRINGE | INTRAMUSCULAR | Status: DC
  Administered 2017-04-07: 100 ug via INTRAVENOUS
  Filled 2017-04-06: qty 0.5

## 2017-04-06 MED ORDER — ALBUTEROL SULFATE (2.5 MG/3ML) 0.083% IN NEBU: 2.5000 mg | INHALATION_SOLUTION | RESPIRATORY_TRACT | Status: DC | PRN

## 2017-04-06 MED ORDER — ONDANSETRON HCL 4 MG/2ML IJ SOLN: 4.0000 mg | Freq: Four times a day (QID) | INTRAMUSCULAR | Status: DC | PRN

## 2017-04-06 MED ORDER — RENA-VITE PO TABS
1.0000 | ORAL_TABLET | Freq: Every day | ORAL | Status: DC
  Administered 2017-04-06 – 2017-04-11 (×5): 1 via ORAL
  Filled 2017-04-06 (×5): qty 1

## 2017-04-06 MED ORDER — HEPARIN (PORCINE) IN NACL 100-0.45 UNIT/ML-% IJ SOLN
1050.0000 [IU]/h | INTRAMUSCULAR | Status: DC
  Administered 2017-04-06 – 2017-04-11 (×5): 1050 [IU]/h via INTRAVENOUS
  Filled 2017-04-06 (×6): qty 250

## 2017-04-06 MED ORDER — PRO-STAT SUGAR FREE PO LIQD
30.0000 mL | Freq: Two times a day (BID) | ORAL | Status: DC
  Administered 2017-04-06 – 2017-04-09 (×6): 30 mL via ORAL
  Filled 2017-04-06 (×9): qty 30

## 2017-04-06 MED ORDER — HYDROXYZINE HCL 25 MG PO TABS: 25.0000 mg | ORAL_TABLET | Freq: Three times a day (TID) | ORAL | Status: DC | PRN

## 2017-04-06 MED ORDER — ZOLPIDEM TARTRATE 5 MG PO TABS
5.0000 mg | ORAL_TABLET | Freq: Every evening | ORAL | Status: DC | PRN
  Administered 2017-04-07 – 2017-04-11 (×3): 5 mg via ORAL
  Filled 2017-04-06 (×3): qty 1

## 2017-04-06 MED ORDER — FAMOTIDINE 20 MG PO TABS
20.0000 mg | ORAL_TABLET | Freq: Every day | ORAL | Status: DC
  Administered 2017-04-07 – 2017-04-12 (×6): 20 mg via ORAL
  Filled 2017-04-06 (×6): qty 1

## 2017-04-06 NOTE — ED Notes (Signed)
Pt changed into hospital bed.

## 2017-04-06 NOTE — Plan of Care (Signed)
Problem: Tissue Perfusion: Goal: Risk factors for ineffective tissue perfusion will decrease Outcome: Not Progressing Pt aware of clot in right leg and educated about importance of heparin drip.
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
## 2017-04-06 NOTE — Progress Notes (Signed)
PROGRESS NOTE    Ashlee [place]  MBE:[id number] DOB: Sep 24, 1940 DOA: 04/05/2017 PCP: Darlina Rumpf, MD   Outpatient Specialists:     Brief Narrative:  Ashlee [place] is a 76 y.o. female with medical history significant of ESRD (newly diagnosed and started on HD), DM2, HTN, chronic back pain, anemia, and recent TIA presenting with R leg swelling/pain.  She was at dialysis today and they called her daughter.  The nurse said that her leg was swollen and painful.  US showed DVT in the right femoral vein where her temporary dialysis catheter is located.  They sent her via ambulance to [facility].    She has an AV graft that is maturing in her right leg.  She has temporary venous access in the right femoral vein.  She is on  MWF HD.  Her leg started bothering her ever since the procedure.  It has not changed or gotten more red or swollen.    She has had cough since last week.  She was started on Mucinex with improvement for just a couple of hours.  Prior rhinorrhea but none recently.  No reflux noted.  She has an IVC filter in place.  She moved here after [facility].  Came on a Tuesday and has been sick since.  Admitted initially from 9/12-15 for TIA/syncope.  Discharged to her daughter's house and admitted again from 9/23-10/3 for acute metabolic encephalopathy and progressive renal failure.  She started HD and was discharged to [facility] and has been there for 2-3 weeks.    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)   Essential hypertension   ESRD on dialysis ([place])   Anemia due to chronic kidney disease   Thrombocytopenia (HCC)   Cough in adult   DVT- history of protein C def per records -Patient with prior IVC filter placement presenting with new DVT -She does not have a PE on CTA -DVT appears to have resulted from temporary dialysis catheter in the right femoral  vein -Initiate anticoagulation - for now, will start Heparin drip-- transition to coumadin unless records from PCP indicate otherwise (has IVC filter)  Cough -Patient with recurrent cough during our interview and physical -Lungs with scattered wheezing  -Her daughter reports that she has been told in the past she has COPD - Duonebs standing and prn albuterol -If ineffective, consider vocal cord dysfunction as the cause for her cough -atelectasis on CT-- incentive spirometry  ESRD on HD --Patient on chronic MWF HD -She does not appear to be volume overloaded or otherwise in need of acute HD -She received her HD today and so will not need it again until Friday -nephrology consulted  HTN -Continue Coreg  DM -A1c on 9/25 was 7.3 -Continue Lantus -Cover with sensitive scale SSI  Anemia/thrombocytopenia -Appears to be stable -Will follow   DVT prophylaxis:  Fully anticoagulated   Code Status: DNR   Family Communication:   Disposition Plan:     Consultants:   nephro     Subjective: Getting breathing treatment in ER  Objective: Vitals:   04/06/17 0820 04/06/17 0822 04/06/17 0900 04/06/17 1030  BP: (!) 101/53  103/88 (!) 100/56  Pulse:  80 74 68  Resp: 16 13 (!) 23   Temp:      TempSrc:      SpO2: 98% 97% 97% 100%  Weight:  Height:        Intake/Output Summary (Last 24 hours) at 04/06/17 1051 Last data filed at 04/06/17 1000  Gross per 24 hour  Intake            166.8 ml  Output                0 ml  Net            166.8 ml   Filed Weights   04/05/17 1813  Weight: 104.8 kg (231 lb)    Examination:  General exam: Appears calm and comfortable  Respiratory system: diminished, few wheezes Cardiovascular system: rrr. Gastrointestinal system: Abdomen is nondistended, soft and nontender. No organomegaly or masses felt. Normal bowel sounds heard. Central nervous system: Alert and oriented. No focal neurological deficits. Extremities:  Symmetric 5 x 5 power. Skin: No rashes, lesions or ulcers Psychiatry: Judgement and insight appear normal. Mood & affect appropriate.     Data Reviewed: I have personally reviewed following labs and imaging studies  CBC:  Recent Labs Lab 04/05/17 1947 04/06/17 0524  WBC 6.8 6.5  NEUTROABS 3.5  --   HGB 9.2* 8.3*  HCT 28.7* 26.7*  MCV 79.9 80.4  PLT 113* 161*   Basic Metabolic Panel:  Recent Labs Lab 04/05/17 1947 04/06/17 0524  NA 131* 135  K 4.0 4.5  CL 95* 98*  CO2 27 30  GLUCOSE 193* 174*  BUN 11 15  CREATININE 2.05* 2.93*  CALCIUM 7.8* 8.1*   GFR: Estimated Creatinine Clearance: 18.5 mL/min (A) (by C-G formula based on SCr of 2.93 mg/dL (H)). Liver Function Tests: No results for input(s): AST, ALT, ALKPHOS, BILITOT, PROT, ALBUMIN in the last 168 hours. No results for input(s): LIPASE, AMYLASE in the last 168 hours. No results for input(s): AMMONIA in the last 168 hours. Coagulation Profile: No results for input(s): INR, PROTIME in the last 168 hours. Cardiac Enzymes: No results for input(s): CKTOTAL, CKMB, CKMBINDEX, TROPONINI in the last 168 hours. BNP (last 3 results) No results for input(s): PROBNP in the last 8760 hours. HbA1C: No results for input(s): HGBA1C in the last 72 hours. CBG:  Recent Labs Lab 04/06/17 0126 04/06/17 0829  GLUCAP 199* 189*   Lipid Profile: No results for input(s): CHOL, HDL, LDLCALC, TRIG, CHOLHDL, LDLDIRECT in the last 72 hours. Thyroid Function Tests: No results for input(s): TSH, T4TOTAL, FREET4, T3FREE, THYROIDAB in the last 72 hours. Anemia Panel: No results for input(s): VITAMINB12, FOLATE, FERRITIN, TIBC, IRON, RETICCTPCT in the last 72 hours. Urine analysis:    Component Value Date/Time   COLORURINE STRAW (A) 03/13/2017 1101   APPEARANCEUR CLEAR 03/13/2017 1101   LABSPEC 1.010 03/13/2017 1101   PHURINE 6.0 03/13/2017 1101   GLUCOSEU NEGATIVE 03/13/2017 1101   HGBUR NEGATIVE 03/13/2017 1101   BILIRUBINUR  NEGATIVE 03/13/2017 1101   KETONESUR NEGATIVE 03/13/2017 1101   PROTEINUR 100 (A) 03/13/2017 1101   NITRITE NEGATIVE 03/13/2017 1101   LEUKOCYTESUR MODERATE (A) 03/13/2017 1101     )No results found for this or any previous visit (from the past 240 hour(s)).    Anti-infectives    None       Radiology Studies: Dg Chest 2 View  Result Date: 04/05/2017 CLINICAL DATA:  Cough and shortness of Breath or several days EXAM: CHEST  2 VIEW COMPARISON:  03/12/2017 FINDINGS: Right chest wall port is again identified and stable. Cardiac shadow is enlarged but stable. Postsurgical changes in the cervical spine are seen. The overall inspiratory effort  is poor although no focal infiltrate or sizable effusion is seen. Vascular stenting is again seen in the left arm. IMPRESSION: Poor inspiratory effort without acute abnormality. Electronically Signed   By: Inez Catalina M.D.   On: 04/05/2017 19:59   Ct Angio Chest Pe W And/or Wo Contrast  Result Date: 04/05/2017 CLINICAL DATA:  Recently diagnosed with deep venous thrombosis in the right thigh. Now complains of chest pain and shortness of breath for 3 days. Positive D-dimer history of previous pulmonary embolus, hypertension, diabetes. EXAM: CT ANGIOGRAPHY CHEST WITH CONTRAST TECHNIQUE: Multidetector CT imaging of the chest was performed using the standard protocol during bolus administration of intravenous contrast. Multiplanar CT image reconstructions and MIPs were obtained to evaluate the vascular anatomy. CONTRAST:  50ccs isovue 370 given. Pt on dialysis <72month, contrast approved by Dr. JJeneen Rinksand Dr. PPosey ProntoCOMPARISON:  None. FINDINGS: Cardiovascular: Moderately good opacification of the central and segmental pulmonary arteries. No filling defects identified. No evidence of significant pulmonary embolus. Mild cardiac enlargement. No pericardial effusion. Coronary artery calcifications. Normal caliber thoracic aorta. No aortic dissection. Great vessel  origins are patent. Scattered calcifications in the aorta. Mediastinum/Nodes: Esophagus is decompressed. No significant lymphadenopathy in the chest. Thyroid gland is unremarkable. Lungs/Pleura: Motion artifact limits evaluation of the lungs. No focal consolidation is identified. Probable atelectasis in the lung bases. No pleural effusions. No pneumothorax. Airways appear patent. Upper Abdomen: Surgical absence of the gallbladder. No bile duct dilatation. Kidneys are partially visualized but appear diffusely atrophic. There is a catheter in the inferior vena cava with tip in the intrahepatic vena cava. Musculoskeletal: Degenerative changes in the thoracic spine. Normal alignment. Diffusely heterogeneous appearance of bone matrix with mixed sclerosis and lucent changes throughout. 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 No vertebral compression fractures. Review of the MIP images confirms the above findings. IMPRESSION: 1. No evidence of significant pulmonary embolus. 2. No evidence of active pulmonary disease. Atelectasis in the lung bases. 3. Aortic atherosclerosis. 4. 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 5. Central venous catheter from inferior approach with tip in the intrahepatic inferior vena cava. Aortic Atherosclerosis (ICD10-I70.0). Electronically Signed   By: WLucienne CapersM.D.   On: 04/05/2017 21:48        Scheduled Meds: . aspirin  325 mg Oral Daily  . calcitRIOL  0.5 mcg Oral Daily  . carvedilol  6.25 mg Oral BID  . docusate sodium  100 mg Oral BID  . famotidine  20 mg Oral BID  . gabapentin  300 mg Oral TID  . insulin aspart  0-5 Units Subcutaneous QHS  . insulin aspart  0-9 Units Subcutaneous TID WC  . insulin glargine  6 Units Subcutaneous Daily  . linaclotide  145 mcg Oral QAC breakfast  . pantoprazole   40 mg Oral Daily  . simvastatin  40 mg Oral Daily   Continuous Infusions: . heparin       LOS: 0 days    Time spent: 35 min    JBayside Gardens DO [facility] Pager [phone] If 7PM-7AM, please contact night-coverage [email or web address] Password [email or web address] 04/06/2017, 10:51 AM

## 2017-04-06 NOTE — Progress Notes (Signed)
[place] for heparin dosing Indication: DVT  No Known Allergies  Patient Measurements: Height: 5\' 1"  (154.9 cm) Weight: 231 lb (104.8 kg) IBW/kg (Calculated) : 47.8 Heparin Dosing Weight: 73.3  Vital Signs Temp: 98.3 F (36.8 C) (10/18 1400) Temp Source: Oral (10/18 1400) BP: 122/63 (10/18 1722) Pulse Rate: 82 (10/18 1722)  Labs:  Recent Labs  04/05/17 1947 04/06/17 0524 04/06/17 0800 04/06/17 1947  HGB 9.2* 8.3*  --   --   HCT 28.7* 26.7*  --   --   PLT 113* 123*  --   --   HEPARINUNFRC  --  1.02* 1.16* 0.56  CREATININE 2.05* 2.93*  --   --     Estimated Creatinine Clearance: 18.5 mL/min (A) (by C-G formula based on SCr of 2.93 mg/dL (H)).   Medical History: Past Medical History:  Diagnosis Date  . CHF (congestive heart failure) ([place])   . Diabetes mellitus without complication ([place])   . ESRD (end stage renal disease) on dialysis ([place])   . History of pituitary adenoma   . Hypertension   . Protein C deficiency ([place])        Assessment: Ashlee Miles admitted from imaging facility with DVT in right thigh. Of note, had stent placed last Friday in left thigh. Not anticoag PTA, no bleeding reported. ESRD MWF. Hg down to 8.3, plt low but stable.   Heparin level now therapeutic  Goal of Therapy:  Heparin level 0.3-0.7 units/ml Monitor platelets by anticoagulation protocol: Yes   Plan:  Continue heparin at 1050 units / hr Daily heparin level/CBC Monitor for s/sx bleeding  Thank you Anette Guarneri, PharmD [phone] 04/06/2017 9:13 PM

## 2017-04-06 NOTE — Care Management Obs Status (Signed)
[facility] NOTIFICATION   Patient Details  Name: Ashlee [place] MRN: [id number] Date of Birth: February 01, 1941   Medicare Observation Status Notification Given:  Yes    Carles Collet, RN 04/06/2017, 2:38 PM

## 2017-04-06 NOTE — Progress Notes (Signed)
ANTICOAGULATION CONSULT NOTE  Pharmacy Consult for heparin dosing Indication: DVT  No Known Allergies  Patient Measurements: Height: 5\' 1"  (154.9 cm) Weight: 231 lb (104.8 kg) IBW/kg (Calculated) : 47.8 Heparin Dosing Weight: 73.3  Vital Signs BP: 101/53 (10/18 0820) Pulse Rate: 80 (10/18 0822)  Labs:  Recent Labs  04/05/17 1947 04/06/17 0524 04/06/17 0800  HGB 9.2* 8.3*  --   HCT 28.7* 26.7*  --   PLT 113* 123*  --   HEPARINUNFRC  --  1.02* 1.16*  CREATININE 2.05* 2.93*  --     Estimated Creatinine Clearance: 18.5 mL/min (A) (by C-G formula based on SCr of 2.93 mg/dL (H)).   Medical History: Past Medical History:  Diagnosis Date  . CHF (congestive heart failure) ([place])   . Diabetes mellitus without complication ([place])   . ESRD (end stage renal disease) on dialysis ([place])   . History of pituitary adenoma   . Hypertension        Assessment: 42 YOF admitted from imaging facility with DVT in right thigh. Of note, had stent placed last Friday in left thigh. Not anticoag PTA, no bleeding reported. ESRD MWF. Hg down to 8.3, plt low but stable.   Heparin level remains high this AM after stat redraw. Unable to get blood peripherally. Drawing from port. RN paused, flush x 3, held x 2 min prior to drawing. No bleeding or IV line issues per RN. Communicated plan to hold and resume at lower rate with RN.  Goal of Therapy:  Heparin level 0.3-0.7 units/ml Monitor platelets by anticoagulation protocol: Yes   Plan:  Hold heparin x 1 hour Resume heparin at reduced rate 1050 units/hr at 1115 6h heparin level Daily heparin level/CBC Monitor for s/sx bleeding   Elicia Lamp, PharmD, BCPS Clinical Pharmacist Rx Phone # for today: [phone] After 3:30PM, please call Main Rx: #[id number] 04/06/2017 9:55 AM

## 2017-04-06 NOTE — ED Notes (Signed)
Pt made aware of holding and hospital bed ordered

## 2017-04-07 LAB — RENAL FUNCTION PANEL
Albumin: 2.6 g/dL — ABNORMAL LOW (ref 3.5–5.0)
Anion gap: 7 (ref 5–15)
BUN: 36 mg/dL — ABNORMAL HIGH (ref 6–20)
CO2: 26 mmol/L (ref 22–32)
Calcium: 8.1 mg/dL — ABNORMAL LOW (ref 8.9–10.3)
Chloride: 101 mmol/L (ref 101–111)
Creatinine, Ser: 4.67 mg/dL — ABNORMAL HIGH (ref 0.44–1.00)
GFR calc Af Amer: 10 mL/min — ABNORMAL LOW (ref >60)
GFR calc non Af Amer: 8 mL/min — ABNORMAL LOW (ref >60)
Glucose, Bld: 223 mg/dL — ABNORMAL HIGH (ref 65–99)
Phosphorus: 4.3 mg/dL (ref 2.5–4.6)
Potassium: 5.1 mmol/L (ref 3.5–5.1)
Sodium: 134 mmol/L — ABNORMAL LOW (ref 135–145)

## 2017-04-07 LAB — GLUCOSE, CAPILLARY
Glucose-Capillary: 207 mg/dL — ABNORMAL HIGH (ref 65–99)
Glucose-Capillary: 199 mg/dL — ABNORMAL HIGH (ref 65–99)
Glucose-Capillary: 187 mg/dL — ABNORMAL HIGH (ref 65–99)

## 2017-04-07 LAB — CBC
HCT: 28 % — ABNORMAL LOW (ref 36.0–46.0)
Hemoglobin: 8.6 g/dL — ABNORMAL LOW (ref 12.0–15.0)
MCH: 24.9 pg — ABNORMAL LOW (ref 26.0–34.0)
MCHC: 30.7 g/dL (ref 30.0–36.0)
MCV: 80.9 fL (ref 78.0–100.0)
Platelets: 147 10*3/uL — ABNORMAL LOW (ref 150–400)
RBC: 3.46 MIL/uL — ABNORMAL LOW (ref 3.87–5.11)
RDW: 16 % — ABNORMAL HIGH (ref 11.5–15.5)
WBC: 5.2 10*3/uL (ref 4.0–10.5)

## 2017-04-07 LAB — PROTIME-INR
INR: 1.15
Prothrombin Time: 14.6 seconds (ref 11.4–15.2)

## 2017-04-07 LAB — HEPARIN LEVEL (UNFRACTIONATED): Heparin Unfractionated: 0.5 IU/mL (ref 0.30–0.70)

## 2017-04-07 MED ORDER — SODIUM CHLORIDE 0.9 % IV SOLN: 100.0000 mL | INTRAVENOUS | Status: DC | PRN

## 2017-04-07 MED ORDER — COUMADIN BOOK
Freq: Once | Status: AC
  Administered 2017-04-08: 11:00:00
  Filled 2017-04-07: qty 1

## 2017-04-07 MED ORDER — LIDOCAINE-PRILOCAINE 2.5-2.5 % EX CREA: 1.0000 "application " | TOPICAL_CREAM | CUTANEOUS | Status: DC | PRN

## 2017-04-07 MED ORDER — LIDOCAINE HCL (PF) 1 % IJ SOLN: 5.0000 mL | INTRAMUSCULAR | Status: DC | PRN

## 2017-04-07 MED ORDER — ACETAMINOPHEN 325 MG PO TABS
ORAL_TABLET | ORAL | Status: AC
  Administered 2017-04-07: 650 mg via ORAL
  Filled 2017-04-07: qty 2

## 2017-04-07 MED ORDER — WARFARIN - PHARMACIST DOSING INPATIENT
Freq: Every day | Status: DC
  Administered 2017-04-08 – 2017-04-12 (×4)

## 2017-04-07 MED ORDER — DOXERCALCIFEROL 4 MCG/2ML IV SOLN
INTRAVENOUS | Status: AC
  Administered 2017-04-07: 2 ug via INTRAVENOUS
  Filled 2017-04-07: qty 2

## 2017-04-07 MED ORDER — WARFARIN VIDEO
Freq: Once | Status: AC
  Administered 2017-04-09: 15:00:00

## 2017-04-07 MED ORDER — WARFARIN SODIUM 5 MG PO TABS
5.0000 mg | ORAL_TABLET | Freq: Once | ORAL | Status: AC
  Administered 2017-04-07: 5 mg via ORAL
  Filled 2017-04-07: qty 1

## 2017-04-07 MED ORDER — DARBEPOETIN ALFA 100 MCG/0.5ML IJ SOSY
PREFILLED_SYRINGE | INTRAMUSCULAR | Status: AC
  Administered 2017-04-07: 100 ug via INTRAVENOUS
  Filled 2017-04-07: qty 0.5

## 2017-04-07 MED ORDER — ALTEPLASE 2 MG IJ SOLR: 2.0000 mg | Freq: Once | INTRAMUSCULAR | Status: DC | PRN

## 2017-04-07 MED ORDER — PENTAFLUOROPROP-TETRAFLUOROETH EX AERO: 1.0000 "application " | INHALATION_SPRAY | CUTANEOUS | Status: DC | PRN

## 2017-04-07 NOTE — Progress Notes (Signed)
PROGRESS NOTE    Caleigh Rabelo  IDH:[id number] DOB: [birth year] DOA: 04/05/2017 PCP: Darlina Rumpf, MD   Brief Narrative: Ashlee [place] is a 76 y.o. femalewith medical history significant of ESRD (newly diagnosed and started on HD), DM2, HTN, chronic back pain, anemia, and recent TIA presenting with R leg swelling/pain. She was at dialysis today and they called her daughter. The nurse said that her leg was swollen and painful. US showed DVT in the right femoral vein where her temporary dialysis catheter is located. They sent her via ambulance to [facility].   She has an AV graft that is maturing in her right leg. She has temporary venous access in the right femoral vein. She is on MWF HD. Her leg started bothering her ever since the procedure. It has not changed or gotten more red or swollen.   She has had cough since last week. She was started on Mucinex with improvement for just a couple of hours. Prior rhinorrhea but none recently. No reflux noted. She has an IVC filter in place.  She moved here after [facility]. Came on a Tuesday and has been sick since. Admitted initially from 9/12-15for TIA/syncope. Discharged to her daughter's house and admitted again from 9/23-10/3 for acute metabolic encephalopathy andprogressive renal failure. Shestarted HD and was discharged to [facility] and has been there for 2-3 weeks.    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  She has recurrent DVT which is provoked secondary to temporary hemodialysis catheter.  Symptoms have improved. -Continue heparin -Start Coumadin and bridge with  heparin  Cough Appears improved  ESRD on hemodialysis She receives hemodialysis on Monday, Wednesday, Friday -Nephrology recommendation  Essential hypertension -Continue Coreg  Diabetes mellitus -Continue Lantus -Continue sliding scale insulin  Anemia Normocytic and chronic.  Likely secondary to anemia of chronic disease.  Previous iron studies in September 2018 suggest anemia of chronic disease.  Hemoglobin currently stable no evidence of bleeding.  Thrombocytopenia Improving -Repeat CBC   DVT prophylaxis: Heparin bridge, Coumadin Code Status: DNR Family Communication: None at bedside Disposition Plan: Discharge likely home with daughter   Consultants:   Nephrology  Procedures:   Hemodialysis, MWF  Antimicrobials:  None   Subjective: Patient reports some pain in her right leg.  Objective: Vitals:   04/06/17 1722 04/06/17 2141 04/07/17 0625 04/07/17 1000  BP: 122/63 (!) 143/44 (!) 115/50 (!) 131/47  Pulse: 82 69 65 72  Resp:  _0 Temp:  98.6 F (37 C) 98.1 F (36.7 C) 98.4 F (36.9 C)  TempSrc:  Oral Oral Oral  SpO2: 98% 98% 99% 98%  Weight:      Height:        Intake/Output Summary (Last 24 hours) at 04/07/17 1341 Last data filed at 04/07/17 1300  Gross per 24 hour  Intake           672.85 ml  Output                0 ml  Net           672.85 ml   [employer]  04/05/17 1813  Weight: 104.8 kg (231 lb)    Examination:  General exam: Appears calm and comfortable  Respiratory system: Clear to auscultation. Respiratory effort normal. Cardiovascular system: S1 & S2 heard, RRR. Gastrointestinal system: Abdomen is nondistended, soft and nontender. Normal bowel sounds heard. Central nervous system: Alert and oriented. No focal neurological deficits. Extremities: Right leg swollen compared to left with tenderness of the right leg. No calf tenderness Skin: No cyanosis. No rashes Psychiatry: Judgement and insight appear normal. Mood &  affect appropriate.     Data Reviewed: I have personally reviewed following labs and imaging studies  CBC:  Recent Labs Lab 04/05/17 1947 04/06/17 0524 04/07/17 0324  WBC 6.8 6.5 5.2  NEUTROABS 3.5  --   --   HGB 9.2* 8.3* 8.6*  HCT 28.7* 26.7* 28.0*  MCV 79.9 80.4 80.9  PLT 113* 123* 025*   Basic Metabolic Panel:  Recent Labs Lab 04/05/17 1947 04/06/17 0524  NA 131* 135  K 4.0 4.5  CL 95* 98*  CO2 27 30  GLUCOSE 193* 174*  BUN 11 15  CREATININE 2.05* 2.93*  CALCIUM 7.8* 8.1*   GFR: Estimated Creatinine Clearance: 18.5 mL/min (A) (by C-G formula based on SCr of 2.93 mg/dL (H)). Liver Function Tests: No results for input(s): AST, ALT, ALKPHOS, BILITOT, PROT, ALBUMIN in the last 168 hours. No results for input(s): LIPASE, AMYLASE in the last 168 hours. No results for input(s): AMMONIA in the last 168 hours. Coagulation Profile: No results for input(s): INR, PROTIME in the last 168 hours. Cardiac Enzymes: No results for input(s): CKTOTAL, CKMB, CKMBINDEX, TROPONINI in the last 168 hours. BNP (last 3 results) No results for input(s): PROBNP in the last 8760 hours. HbA1C: No results for input(s): HGBA1C in the last 72 hours. CBG:  Recent Labs Lab 04/06/17 1230 04/06/17 1618 04/06/17 2136 04/07/17 0804 04/07/17 1220  GLUCAP 125* 242* 208* 207* 199*   Lipid Profile: No results for input(s): CHOL, HDL, LDLCALC, TRIG, CHOLHDL, LDLDIRECT in the last 72 hours. Thyroid Function Tests: No results for input(s): TSH, T4TOTAL, FREET4, T3FREE, THYROIDAB in the last 72 hours. Anemia Panel: No results for input(s): VITAMINB12, FOLATE, FERRITIN, TIBC, IRON, RETICCTPCT in the last 72 hours. Sepsis Labs: No results for input(s): PROCALCITON, LATICACIDVEN in the last 168 hours.  No results found for this or any previous visit (from the past 240 hour(s)).       Radiology Studies: Dg Chest 2 View  Result Date: 04/05/2017 CLINICAL DATA:  Cough and shortness of  Breath or several days EXAM: CHEST  2 VIEW COMPARISON:  03/12/2017 FINDINGS: Right chest wall port is again identified and stable. Cardiac shadow is enlarged but stable. Postsurgical changes in the cervical spine are seen. The overall inspiratory effort is poor although no focal infiltrate or sizable effusion is seen. Vascular stenting is again seen in the left arm. IMPRESSION: Poor inspiratory effort without acute abnormality. Electronically Signed   By: Inez Catalina M.D.   On: 04/05/2017 19:59   Ct Angio Chest Pe W And/or Wo Contrast  Result Date: 04/05/2017 CLINICAL DATA:  Recently diagnosed with deep venous thrombosis in the right thigh. Now complains of chest pain and shortness of breath for 3 days. Positive D-dimer history of previous pulmonary embolus, hypertension, diabetes. EXAM: CT ANGIOGRAPHY CHEST WITH CONTRAST TECHNIQUE: Multidetector CT imaging of the chest was performed using the standard protocol during bolus administration of intravenous contrast. Multiplanar CT image reconstructions and MIPs were obtained to evaluate the vascular anatomy.  CONTRAST:  50ccs isovue 370 given. Pt on dialysis <62month, contrast approved by Dr. JJeneen Rinksand Dr. PPosey ProntoCOMPARISON:  None. FINDINGS: Cardiovascular: Moderately good opacification of the central and segmental pulmonary arteries. No filling defects identified. No evidence of significant pulmonary embolus. Mild cardiac enlargement. No pericardial effusion. Coronary artery calcifications. Normal caliber thoracic aorta. No aortic dissection. Great vessel origins are patent. Scattered calcifications in the aorta. Mediastinum/Nodes: Esophagus is decompressed. No significant lymphadenopathy in the chest. Thyroid gland is unremarkable. Lungs/Pleura: Motion artifact limits evaluation of the lungs. No focal consolidation is identified. Probable atelectasis in the lung bases. No pleural effusions. No pneumothorax. Airways appear patent. Upper Abdomen: Surgical absence  of the gallbladder. No bile duct dilatation. Kidneys are partially visualized but appear diffusely atrophic. There is a catheter in the inferior vena cava with tip in the intrahepatic vena cava. Musculoskeletal: Degenerative changes in the thoracic spine. Normal alignment. Diffusely heterogeneous appearance of bone matrix with mixed sclerosis and lucent changes throughout. 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 No vertebral compression fractures. Review of the MIP images confirms the above findings. IMPRESSION: 1. No evidence of significant pulmonary embolus. 2. No evidence of active pulmonary disease. Atelectasis in the lung bases. 3. Aortic atherosclerosis. 4. 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 5. Central venous catheter from inferior approach with tip in the intrahepatic inferior vena cava. Aortic Atherosclerosis (ICD10-I70.0). Electronically Signed   By: WLucienne CapersM.D.   On: 04/05/2017 21:48        Scheduled Meds: . aspirin  325 mg Oral Daily  . carvedilol  6.25 mg Oral BID  . coumadin book   Does not apply Once  . darbepoetin (ARANESP) injection - DIALYSIS  100 mcg Intravenous Q Fri-HD  . docusate sodium  100 mg Oral BID  . doxercalciferol  2 mcg Intravenous Q M,W,F-HD  . famotidine  20 mg Oral Daily  . feeding supplement (PRO-STAT SUGAR FREE 64)  30 mL Oral BID  . gabapentin  300 mg Oral TID  . insulin aspart  0-5 Units Subcutaneous QHS  . insulin aspart  0-9 Units Subcutaneous TID WC  . insulin glargine  6 Units Subcutaneous Daily  . linaclotide  145 mcg Oral QAC breakfast  . multivitamin  1 tablet Oral QHS  . pantoprazole  40 mg Oral Daily  . simvastatin  40 mg Oral Daily  . warfarin  5 mg Oral ONCE-1800  . warfarin   Does not apply Once  . Warfarin - Pharmacist Dosing Inpatient   Does not apply  q1800   Continuous Infusions: . heparin 1,050 Units/hr (04/06/17 1139)     LOS: 1 day     RCordelia Poche MD [facility] 04/07/2017, 1:41 PM Pager: [phone] If 7PM-7AM, please contact night-coverage [email or web address] Password [email or web address] 04/07/2017, 1:41 PM

## 2017-04-07 NOTE — Progress Notes (Signed)
Initial Nutrition Assessment  DOCUMENTATION CODES:   Morbid obesity  INTERVENTION:   -Continue Pro-Stat 30 mL BID po  -Continue Nepro Shake po TID prn, each supplement provides 425 kcal and 19 grams protein   NUTRITION DIAGNOSIS:   Increased nutrient needs related to chronic illness as evidenced by estimated needs.  GOAL:   Patient will meet greater than or equal to 90% of their needs  MONITOR:   PO intake, Supplement acceptance, Labs, Weight trends  REASON FOR ASSESSMENT:   Malnutrition Screening Tool    ASSESSMENT:   76 yo female admitted with DVT.  Pt with newly diagnosed ESRD with HD, DM, HTN, recent TIA  Pt has been on dialysis for a few weeks now.   Pt reports appetite is poor. Pt reports she typically eats 3 meals per day currently. Breakfast is grits and bacon. Lunch is typically a meat and cheese sandwich. Dinner is meat with a veggie and starch. Pt reports she typically does not snack.   Noted pt current at EDW of 104 kg but LE still present. Noted current wt from 10/17; recommend re-weighing.   Nutrition-Focused physical exam completed. Findings are no fat depletion, no muscle depletion, and moderate edema.   Labs: CBGs 125-208 Meds: calcium carbonate, hectoral, ss novolog, lantus, Rena-Vit  Diet Order:  Diet renal/carb modified with fluid restriction Diet-HS Snack? Nothing; Room service appropriate? Yes; Fluid consistency: Thin  Skin:  Wound (see comment) (stage I sacrum, multiple leg incisions)  Last BM:  no documented BM  Height:   Ht Readings from Last 1 Encounters:  04/05/17 5\' 1"  (1.549 m)    Weight:   Wt Readings from Last 1 Encounters:  04/05/17 231 lb (104.8 kg)    Ideal Body Weight:     BMI:  Body mass index is 43.65 kg/m.  Estimated Nutritional Needs:   Kcal:  1388-7195 kcals  Protein:  120-137 g  Fluid:  1000 mL plus UOP  EDUCATION NEEDS:   Education needs addressed  Kerman Passey MS, RD, LDN [phone] Pager   [phone] Weekend/On-Call Pager

## 2017-04-07 NOTE — Progress Notes (Signed)
[facility] Progress Note  Subjective: doing well, R leg still swollen and painful, no SOB  Vitals:   04/06/17 1400 04/06/17 1722 04/06/17 2141 04/07/17 0625  BP: (!) 92/56 122/63 (!) 143/44 (!) 115/50  Pulse: 76 82 69 65  Resp: 16  16 17   Temp: 98.3 F (36.8 C)  98.6 F (37 C) 98.1 F (36.7 C)  TempSrc: Oral  Oral Oral  SpO2: 97% 98% 98% 99%  Weight:      Height:        Inpatient medications: . aspirin  325 mg Oral Daily  . carvedilol  6.25 mg Oral BID  . darbepoetin (ARANESP) injection - DIALYSIS  100 mcg Intravenous Q Fri-HD  . docusate sodium  100 mg Oral BID  . doxercalciferol  2 mcg Intravenous Q M,W,F-HD  . famotidine  20 mg Oral Daily  . feeding supplement (PRO-STAT SUGAR FREE 64)  30 mL Oral BID  . gabapentin  300 mg Oral TID  . insulin aspart  0-5 Units Subcutaneous QHS  . insulin aspart  0-9 Units Subcutaneous TID WC  . insulin glargine  6 Units Subcutaneous Daily  . linaclotide  145 mcg Oral QAC breakfast  . multivitamin  1 tablet Oral QHS  . pantoprazole  40 mg Oral Daily  . simvastatin  40 mg Oral Daily   . heparin 1,050 Units/hr (04/06/17 1139)   acetaminophen **OR** acetaminophen, calcium carbonate (dosed in mg elemental calcium), camphor-menthol **AND** hydrOXYzine, cyclobenzaprine, docusate sodium, feeding supplement (NEPRO CARB STEADY), HYDROmorphone (DILAUDID) injection, ipratropium-albuterol, ondansetron **OR** ondansetron (ZOFRAN) IV, sodium chloride flush, sorbitol, zolpidem  Exam: General: WDWN, NAD, BF Head: NCAT, sclera not icteric, MMM Neck: Supple. No lymphadenopathy, Lungs: CTA bilaterally. No wheeze, rales or rhonchi. Chest: right side port.  Heart: RRR. No murmur, rubs or gallops.  Abdomen: soft, nontender, +BS, no guarding, no rebound tenderness Ext: 2+ R lower leg edema, no edema on L Neuro: NF, Ox 3 Dialysis Access: L thigh AVG +thrill, R TDC -minimal drainage around site.  Dialysis Orders: MWF NW 4h  104kg    3K/2.25  R thigh TDC / L thigh AVG placed 03/16/17  Hep 4000 - Mircera 66mcg q 2wks - last 10/5  Last Labs: 10/12 Hgb 9.5, K 5.1, Ca 8.7, P 4.0  Assessment/Plan: 1. DVT R leg - h/o PE with IVC filter. Anticoagulation started.  DVT is likely associated with R femoral TDC, plan to remove cath after successful cannulation of AVG. No PE indicated on CT. 2. Cough - persistent throughout exam.  Possible history of COPD. Per primary.  3. ESRD -  MWF HD.  Use AVG today 1st time and if successful, remove TDC R thigh later today or tomorrow.  4. HTN/ volume - at dry wt, sig LE edema, BP's normal on low-dose Coreg 5. Anemia of CKD  - Hgb trending down 9.2> 8.3. Last mircera given 10/5. Add aranesp 100 mcg qwk on Fri.  6. Secondary Hyperparathyroidism -  Ca in goal. P will be drawn tomorrow. Continue VDRA. 7. Nutrition - Alb pending, previously low. Start prostat, renavite, renal diet.  8. HF -EF 55-60% - per primary 9. DMT2 - per primary   Plan - HD today, get vol and drywt down as tolerated.    [name] MD [facility] pager [phone]   04/07/2017, 10:46 AM    Recent Labs Lab 04/05/17 1947 04/06/17 0524  NA 131* 135  K 4.0 4.5  CL 95* 98*  CO2 27 30  GLUCOSE 193* 174*  BUN 11 15  CREATININE 2.05* 2.93*  CALCIUM 7.8* 8.1*   No results for input(s): AST, ALT, ALKPHOS, BILITOT, PROT, ALBUMIN in the last 168 hours.  Recent Labs Lab 04/05/17 1947 04/06/17 0524 04/07/17 0324  WBC 6.8 6.5 5.2  NEUTROABS 3.5  --   --   HGB 9.2* 8.3* 8.6*  HCT 28.7* 26.7* 28.0*  MCV 79.9 80.4 80.9  PLT 113* 123* 147*   Iron/TIBC/Ferritin/ %Sat    Component Value Date/Time   IRON 32 03/14/2017 1036   TIBC 169 (L) 03/14/2017 1036   FERRITIN 364 (H) 03/14/2017 1036   IRONPCTSAT 19 03/14/2017 1036

## 2017-04-07 NOTE — Care Management Note (Signed)
Case Management Note  Patient Details  Name: Ashlee Miles MRN: [id number] Date of Birth: [birth year]  Subjective/Objective:        CM following fjor progression and d/c planning.            Action/Plan: 04/07/2017 Noted CM referral for medication assistance. Noted that pt has Medicare and Medicaid, therefore we are unable to offer further assistance.   Expected Discharge Date:                  Expected Discharge Plan:  [place]  In-House Referral:     Discharge planning Services  CM Consult, Medication Assistance  Post Acute Care Choice:    Choice offered to:     DME Arranged:    DME Agency:     HH Arranged:    HH Agency:     Status of Service:  In process, will continue to follow  If discussed at Long Length of Stay Meetings, dates discussed:    Additional Comments:  Adron Bene, RN 04/07/2017, 12:50 PM

## 2017-04-07 NOTE — Progress Notes (Addendum)
[facility] for Heparin and Warfarin  Indication: DVT RLE  No Known Allergies  Patient Measurements: Height: 5\' 1"  (154.9 cm) Weight: 231 lb (104.8 kg) IBW/kg (Calculated) : 47.8 Heparin Dosing Weight: 73.3  Vital Signs Temp: 98.4 F (36.9 C) (10/19 1000) Temp Source: Oral (10/19 1000) BP: 131/47 (10/19 1000) Pulse Rate: 72 (10/19 1000)  Labs:  Recent Labs  04/05/17 1947  04/06/17 0524 04/06/17 0800 04/06/17 1947 04/07/17 0324  HGB 9.2*  --  8.3*  --   --  8.6*  HCT 28.7*  --  26.7*  --   --  28.0*  PLT 113*  --  123*  --   --  147*  HEPARINUNFRC  --   < > 1.02* 1.16* 0.56 0.50  CREATININE 2.05*  --  2.93*  --   --   --   < > = values in this interval not displayed.  Estimated Creatinine Clearance: 18.5 mL/min (A) (by C-G formula based on SCr of 2.93 mg/dL (H)).   Medical History: Past Medical History:  Diagnosis Date  . CHF (congestive heart failure) ([place])   . Diabetes mellitus without complication ([place])   . ESRD (end stage renal disease) on dialysis ([place])   . History of pituitary adenoma   . Hypertension   . Protein C deficiency ([place])        Assessment: 22 YOF admitted from imaging facility with DVT in right thigh. Of note, had AV fistula placed  in left thigh and right femoral TDC placed  on 03/16/17.  Not on anticoagulation PTA.  ESRD MWF. Hgb low/stable at 8.6, plt low but stable at 147k. No bleeding reported.  Heparin level is 0.5, remains therapeutic on IV heparin drip rate 1050 units/hr.  Pharmacy consulted to start Warfarin today.  Albumin on 03/22/17 was 2.3   Goal of Therapy:  Heparin level 0.3-0.7 units/ml Monitor platelets by anticoagulation protocol: Yes   Plan:  Continue heparin at 1050 units / hr Daily heparin level/CBC Starting Warfarin today- give Warfarin 5 mg po x1 today INR today , and Daily INR Monitor for s/sx bleeding   Thank you for allowing pharmacy to be part of this patients care  team. Nicole Cella, RPh Clinical Pharmacist Pager: [phone]-[phone] [phone] phone [phone] or [place] 04/07/2017 1:22 PM   Addendum:   Baseline INR = 1.15 today  Continue with plan as noted above.  Nicole Cella, RPh  Clinical Pharmacist 04/07/2017 4:17 PM

## 2017-04-08 LAB — CBC
HCT: 30.1 % — ABNORMAL LOW (ref 36.0–46.0)
Hemoglobin: 9.6 g/dL — ABNORMAL LOW (ref 12.0–15.0)
MCH: 25.5 pg — ABNORMAL LOW (ref 26.0–34.0)
MCHC: 31.9 g/dL (ref 30.0–36.0)
MCV: 80.1 fL (ref 78.0–100.0)
Platelets: 113 10*3/uL — ABNORMAL LOW (ref 150–400)
RBC: 3.76 MIL/uL — ABNORMAL LOW (ref 3.87–5.11)
RDW: 15.8 % — ABNORMAL HIGH (ref 11.5–15.5)
WBC: 6.2 10*3/uL (ref 4.0–10.5)

## 2017-04-08 LAB — PROTIME-INR
INR: 1.07
Prothrombin Time: 13.8 seconds (ref 11.4–15.2)

## 2017-04-08 LAB — GLUCOSE, CAPILLARY
Glucose-Capillary: 156 mg/dL — ABNORMAL HIGH (ref 65–99)
Glucose-Capillary: 182 mg/dL — ABNORMAL HIGH (ref 65–99)
Glucose-Capillary: 162 mg/dL — ABNORMAL HIGH (ref 65–99)
Glucose-Capillary: 129 mg/dL — ABNORMAL HIGH (ref 65–99)

## 2017-04-08 LAB — HEPARIN LEVEL (UNFRACTIONATED): Heparin Unfractionated: 0.69 IU/mL (ref 0.30–0.70)

## 2017-04-08 MED ORDER — BENZONATATE 100 MG PO CAPS
200.0000 mg | ORAL_CAPSULE | Freq: Three times a day (TID) | ORAL | Status: DC
  Administered 2017-04-08 – 2017-04-12 (×13): 200 mg via ORAL
  Filled 2017-04-08 (×14): qty 2

## 2017-04-08 MED ORDER — WARFARIN SODIUM 5 MG PO TABS
5.0000 mg | ORAL_TABLET | Freq: Once | ORAL | Status: AC
  Administered 2017-04-08: 5 mg via ORAL
  Filled 2017-04-08: qty 1

## 2017-04-08 NOTE — Evaluation (Signed)
Physical Therapy Evaluation Patient Details Name: Ashlee Miles MRN: [id number] DOB: 06/11/1941 Today's Date: 04/08/2017   History of Present Illness  Pt is a 76 y/o female admitted from [facility] secondary to R LE pain. Pt found to have R femoral vein DVT and has been on a heparin drip since 10/18. PMH including but not limited to ESRD  Clinical Impression  Pt presented supine in bed with HOB elevated, awake and willing to participate in therapy session. Prior to admission, pt reported that she has been at [employer] for a couple of weeks, working with therapy services almost everyday. Pt reported that her PT goals were "standing" and "walking with the walker". Pt currently very limited secondary to R LE pain with movement and weight bearing. Pt requires min-mod A with bed mobility, min A with transfers and min A to ambulate (limited to side steps at EOB only this session secondary to R LE pain). Pt would continue to benefit from skilled physical therapy services at this time while admitted and after d/c to address the below listed limitations in order to improve overall safety and independence with functional mobility.     Follow Up Recommendations SNF    Equipment Recommendations  None recommended by PT    Recommendations for Other Services       Precautions / Restrictions Precautions Precautions: Fall Restrictions Weight Bearing Restrictions: No      Mobility  Bed Mobility Overal bed mobility: Needs Assistance Bed Mobility: Supine to Sit;Sit to Supine     Supine to sit: Mod assist Sit to supine: Min assist   General bed mobility comments: increased time and effort, cueing for technique, use of bed rail, mod A to elevate trunk and min A with bilateral LEs to return onto bed  Transfers Overall transfer level: Needs assistance Equipment used: Rolling walker (2 wheeled) Transfers: Sit to/from Stand Sit to Stand: Min assist         General  transfer comment: assist to power into standing and for stability with transition  Ambulation/Gait             General Gait Details: pt only able to tolerate several small side steps at EOB with min A for stability; limited due to R LE pain  Stairs            Wheelchair Mobility    Modified Rankin (Stroke Patients Only)       Balance Overall balance assessment: Needs assistance Sitting-balance support: Feet supported Sitting balance-[name]: Fair     Standing balance support: Bilateral upper extremity supported;During functional activity Standing balance-[name]: Poor Standing balance comment: heavy reliance on RW                             Pertinent Vitals/Pain Pain Assessment: 0-10 Pain Score: 7  Pain Location: R LE Pain Descriptors / Indicators: Grimacing;Sore;Guarding Pain Intervention(s): Monitored during session;Repositioned    Home Living Family/patient expects to be discharged to:: Skilled nursing facility                 Additional Comments: [employer], [employer]    Prior Function Level of Independence: Needs assistance   Gait / Transfers Assistance Needed: ambulates with rollator  ADL's / Homemaking Assistance Needed: needs assistance from SNF staff        Hand Dominance        Extremity/Trunk Assessment   Upper Extremity Assessment Upper Extremity Assessment: Generalized  weakness    Lower Extremity Assessment Lower Extremity Assessment: Generalized weakness;RLE deficits/detail RLE Deficits / Details: painful and weak with noted edema, all of which is limiting her strength and AROM of ankle       Communication   Communication: No difficulties  Cognition Arousal/Alertness: Awake/alert Behavior During Therapy: WFL for tasks assessed/performed Overall Cognitive Status: Within Functional Limits for tasks assessed                                        General Comments      Exercises      Assessment/Plan    PT Assessment Patient needs continued PT services  PT Problem List Decreased strength;Decreased range of motion;Decreased activity tolerance;Decreased balance;Decreased mobility;Decreased coordination;Decreased knowledge of use of DME;Decreased safety awareness;Decreased knowledge of precautions;Pain       PT Treatment Interventions DME instruction;Gait training;Stair training;Functional mobility training;Therapeutic activities;Therapeutic exercise;Balance training;Neuromuscular re-education;Patient/family education    PT Goals (Current goals can be found in the Care Plan section)  Acute Rehab PT Goals Patient Stated Goal: decrease pain PT Goal Formulation: With patient Time For Goal Achievement: 04/22/17 Potential to Achieve Goals: Good    Frequency Min 2X/week   Barriers to discharge        Co-evaluation               AM-PAC PT "6 Clicks" Daily Activity  Outcome Measure Difficulty turning over in bed (including adjusting bedclothes, sheets and blankets)?: A Lot Difficulty moving from lying on back to sitting on the side of the bed? : Unable Difficulty sitting down on and standing up from a chair with arms (e.g., wheelchair, bedside commode, etc,.)?: Unable Help needed moving to and from a bed to chair (including a wheelchair)?: A Little Help needed walking in hospital room?: A Lot Help needed climbing 3-5 steps with a railing? : Total 6 Click Score: 10    End of Session Equipment Utilized During Treatment: Gait belt Activity Tolerance: Patient limited by pain;Patient limited by fatigue Patient left: in bed;with call bell/phone within reach;with bed alarm set Nurse Communication: Mobility status PT Visit Diagnosis: Muscle weakness (generalized) (M62.81);Pain;Other abnormalities of gait and mobility (R26.89) Pain - Right/Left: Right Pain - part of body: Leg    Time: 6333-5456 PT Time Calculation (min) (ACUTE ONLY): 19 min   Charges:   PT  Evaluation $PT Eval Moderate Complexity: 1 Mod     PT G Codes:        [place], PT, DPT [place] 04/08/2017, 10:02 AM

## 2017-04-08 NOTE — Progress Notes (Signed)
PROGRESS NOTE    Ashlee Miles  XLK:[id number] DOB: 03-30-1941 DOA: 04/05/2017 PCP: Darlina Rumpf, MD   Brief Narrative: Ashlee Miles is a 76 y.o. femalewith medical history significant of ESRD (newly diagnosed and started on HD), DM2, HTN, chronic back pain, anemia, and recent TIA presenting with R leg swelling/pain. She was at dialysis today and they called her daughter. The nurse said that her leg was swollen and painful. US showed DVT in the right femoral vein where her temporary dialysis catheter is located. They sent her via ambulance to [facility].   She has an AV graft that is maturing in her right leg. She has temporary venous access in the right femoral vein. She is on MWF HD. Her leg started bothering her ever since the procedure. It has not changed or gotten more red or swollen.   She has had cough since last week. She was started on Mucinex with improvement for just a couple of hours. Prior rhinorrhea but none recently. No reflux noted. She has an IVC filter in place.  She moved here after [facility]. Came on a Tuesday and has been sick since. Admitted initially from 9/12-15for TIA/syncope. Discharged to her daughter's house and admitted again from 9/23-10/3 for acute metabolic encephalopathy andprogressive renal failure. Shestarted HD and was discharged to [facility] and has been there for 2-3 weeks.    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  She has recurrent DVT which is provoked secondary to temporary hemodialysis catheter.  Symptoms have improved. -Continue Coumadin and bridge with  heparin  Cough Recurrent.  Chest x-ray unremarkable.  Nonproductive.  -Tessalon perles  ESRD on hemodialysis She receives hemodialysis on Monday, Wednesday, Friday -Nephrology recommendations  Essential hypertension -Continue Coreg  Diabetes mellitus -Continue Lantus -Continue sliding scale insulin  Anemia Normocytic and chronic.  Likely secondary to anemia of chronic disease.  Previous iron studies in September 2018 suggest anemia of chronic disease.  Hemoglobin currently stable no evidence of bleeding.  Thrombocytopenia Labile.  No evidence of bleeding. -Continue to trend   DVT prophylaxis: Heparin bridge, Coumadin Code Status: DNR Family Communication: None at bedside Disposition Plan: Discharge likely home with daughter   Consultants:   Nephrology  Procedures:   Hemodialysis, MWF  Antimicrobials:  None   Subjective: Continued pain in her leg.  Coughing  Objective: Vitals:   04/07/17 1820 04/07/17 2231 04/08/17 0541 04/08/17 1112  BP: (!) 125/47 (!) 109/58 126/68 (!) 119/38  Pulse: 69 70 70 66  Resp: 18 18 20 17   Temp: 98.3 F (36.8 C) 98.8 F (37.1 C) 98.7 F (37.1 C) 98.2 F (36.8 C)  TempSrc: Oral Oral Oral Oral  SpO2: 99% 98% 98% 98%  Weight: 106.3 kg (234 lb 5.6 oz) 109 kg (240 lb 4.8 oz)  107.5 kg (236 lb 15.9 oz)  Height:        Intake/Output Summary (Last 24 hours) at 04/08/17 1348 Last data filed at 04/08/17 1100  Gross per 24 hour  Intake           491.83 ml  Output  850 ml  Net          -358.17 ml   Filed Weights   04/07/17 1820 04/07/17 2231 04/08/17 1112  Weight: 106.3 kg (234 lb 5.6 oz) 109 kg (240 lb 4.8 oz) 107.5 kg (236 lb 15.9 oz)    Examination:  General exam: Appears calm and comfortable  Respiratory system: Clear to auscultation. Respiratory effort normal. Cardiovascular system: S1 & S2 heard, RRR. Gastrointestinal system: Abdomen is nondistended, soft and nontender. Normal bowel sounds  heard. Central nervous system: Alert and oriented. No focal neurological deficits. Extremities: Right leg swollen compared to left with tenderness of the right leg. No calf tenderness Skin: No cyanosis. No rashes Psychiatry: Judgement and insight appear normal. Mood & affect appropriate.     Data Reviewed: I have personally reviewed following labs and imaging studies  CBC:  Recent Labs Lab 04/05/17 1947 04/06/17 0524 04/07/17 0324 04/08/17 0235  WBC 6.8 6.5 5.2 6.2  NEUTROABS 3.5  --   --   --   HGB 9.2* 8.3* 8.6* 9.6*  HCT 28.7* 26.7* 28.0* 30.1*  MCV 79.9 80.4 80.9 80.1  PLT 113* 123* 147* 829*   Basic Metabolic Panel:  Recent Labs Lab 04/05/17 1947 04/06/17 0524 04/07/17 1457  NA 131* 135 134*  K 4.0 4.5 5.1  CL 95* 98* 101  CO2 27 30 26   GLUCOSE 193* 174* 223*  BUN 11 15 36*  CREATININE 2.05* 2.93* 4.67*  CALCIUM 7.8* 8.1* 8.1*  PHOS  --   --  4.3   GFR: Estimated Creatinine Clearance: 11.8 mL/min (A) (by C-G formula based on SCr of 4.67 mg/dL (H)). Liver Function Tests:  Recent Labs Lab 04/07/17 1457  ALBUMIN 2.6*   No results for input(s): LIPASE, AMYLASE in the last 168 hours. No results for input(s): AMMONIA in the last 168 hours. Coagulation Profile:  Recent Labs Lab 04/07/17 1456 04/08/17 0235  INR 1.15 1.07   Cardiac Enzymes: No results for input(s): CKTOTAL, CKMB, CKMBINDEX, TROPONINI in the last 168 hours. BNP (last 3 results) No results for input(s): PROBNP in the last 8760 hours. HbA1C: No results for input(s): HGBA1C in the last 72 hours. CBG:  Recent Labs Lab 04/07/17 0804 04/07/17 1220 04/07/17 2229 04/08/17 0838 04/08/17 1232  GLUCAP 207* 199* 187* 156* 182*   Lipid Profile: No results for input(s): CHOL, HDL, LDLCALC, TRIG, CHOLHDL, LDLDIRECT in the last 72 hours. Thyroid Function Tests: No results for input(s): TSH, T4TOTAL, FREET4, T3FREE, THYROIDAB in the last 72 hours. Anemia Panel: No results for input(s):  VITAMINB12, FOLATE, FERRITIN, TIBC, IRON, RETICCTPCT in the last 72 hours. Sepsis Labs: No results for input(s): PROCALCITON, LATICACIDVEN in the last 168 hours.  No results found for this or any previous visit (from the past 240 hour(s)).       Radiology Studies: No results found.      Scheduled Meds: . aspirin  325 mg Oral Daily  . benzonatate  200 mg Oral TID  . carvedilol  6.25 mg Oral BID  . darbepoetin (ARANESP) injection - DIALYSIS  100 mcg Intravenous Q Fri-HD  . docusate sodium  100 mg Oral BID  . doxercalciferol  2 mcg Intravenous Q M,W,F-HD  . famotidine  20 mg Oral Daily  . feeding supplement (PRO-STAT SUGAR FREE 64)  30 mL Oral BID  . gabapentin  300 mg Oral TID  . insulin aspart  0-5 Units Subcutaneous QHS  . insulin aspart  0-9 Units Subcutaneous TID WC  . insulin  glargine  6 Units Subcutaneous Daily  . linaclotide  145 mcg Oral QAC breakfast  . multivitamin  1 tablet Oral QHS  . pantoprazole  40 mg Oral Daily  . simvastatin  40 mg Oral Daily  . warfarin  5 mg Oral ONCE-1800  . warfarin   Does not apply Once  . Warfarin - Pharmacist Dosing Inpatient   Does not apply q1800   Continuous Infusions: . heparin 1,050 Units/hr (04/06/17 1139)     LOS: 2 days     Cordelia Poche, MD [facility] 04/08/2017, 1:48 PM Pager: [phone]  If 7PM-7AM, please contact night-coverage [email or web address] Password [email or web address] 04/08/2017, 1:48 PM
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
## 2017-04-08 NOTE — Progress Notes (Signed)
[place] for Heparin and Warfarin  Indication: DVT RLE  No Known Allergies  Patient Measurements: Height: 5\' 1"  (154.9 cm) Weight: 240 lb 4.8 oz (109 kg) IBW/kg (Calculated) : 47.8 Heparin Dosing Weight: 73.3  Vital Signs Temp: 98.7 F (37.1 C) (10/20 0541) Temp Source: Oral (10/20 0541) BP: 126/68 (10/20 0541) Pulse Rate: 70 (10/20 0541)  Labs:  Recent Labs  04/05/17 1947 04/06/17 0524  04/06/17 1947 04/07/17 0324 04/07/17 1456 04/07/17 1457 04/08/17 0235  HGB 9.2* 8.3*  --   --  8.6*  --   --  9.6*  HCT 28.7* 26.7*  --   --  28.0*  --   --  30.1*  PLT 113* 123*  --   --  147*  --   --  113*  LABPROT  --   --   --   --   --  14.6  --  13.8  INR  --   --   --   --   --  1.15  --  1.07  HEPARINUNFRC  --  1.02*  < > 0.56 0.50  --   --  0.69  CREATININE 2.05* 2.93*  --   --   --   --  4.67*  --   < > = values in this interval not displayed.  Estimated Creatinine Clearance: 11.9 mL/min (A) (by C-G formula based on SCr of 4.67 mg/dL (H)).   Medical History: Past Medical History:  Diagnosis Date  . CHF (congestive heart failure) ([place])   . Diabetes mellitus without complication ([place])   . ESRD (end stage renal disease) on dialysis ([place])   . History of pituitary adenoma   . Hypertension   . Protein C deficiency ([place])        Assessment: 12 YOF admitted from imaging facility with DVT in right thigh. Of note, had AV fistula placed  in left thigh and right femoral TDC placed  on 03/16/17.  Not on anticoagulation PTA.  ESRD MWF.   Heparin level therapeutic INR 1.07 today No bleeding noted  Goal of Therapy:  Heparin level 0.3-0.7 units/ml Monitor platelets by anticoagulation protocol: Yes  INR = 2 to 3   Plan:  Continue heparin at 1050 units / hr Daily heparin level/CBC Warfarin 5 mg po x 1 today at 1800 pm  Thank you Anette Guarneri, PharmD [phone] 04/08/2017 11:04 AM

## 2017-04-09 ENCOUNTER — Inpatient Hospital Stay (HOSPITAL_COMMUNITY): Payer: 59

## 2017-04-09 LAB — CBC
HCT: 27.5 % — ABNORMAL LOW (ref 36.0–46.0)
Hemoglobin: 8.8 g/dL — ABNORMAL LOW (ref 12.0–15.0)
MCH: 25.4 pg — ABNORMAL LOW (ref 26.0–34.0)
MCHC: 32 g/dL (ref 30.0–36.0)
MCV: 79.3 fL (ref 78.0–100.0)
Platelets: 129 10*3/uL — ABNORMAL LOW (ref 150–400)
RBC: 3.47 MIL/uL — ABNORMAL LOW (ref 3.87–5.11)
RDW: 16.2 % — ABNORMAL HIGH (ref 11.5–15.5)
WBC: 6.4 10*3/uL (ref 4.0–10.5)

## 2017-04-09 LAB — PROTIME-INR
INR: 1.06
Prothrombin Time: 13.8 seconds (ref 11.4–15.2)

## 2017-04-09 LAB — GLUCOSE, CAPILLARY
Glucose-Capillary: 168 mg/dL — ABNORMAL HIGH (ref 65–99)
Glucose-Capillary: 180 mg/dL — ABNORMAL HIGH (ref 65–99)
Glucose-Capillary: 225 mg/dL — ABNORMAL HIGH (ref 65–99)
Glucose-Capillary: 287 mg/dL — ABNORMAL HIGH (ref 65–99)

## 2017-04-09 LAB — HEPARIN LEVEL (UNFRACTIONATED): Heparin Unfractionated: 0.35 IU/mL (ref 0.30–0.70)

## 2017-04-09 MED ORDER — LIDOCAINE HCL (PF) 1 % IJ SOLN
INTRAMUSCULAR | Status: AC
  Filled 2017-04-09: qty 30

## 2017-04-09 MED ORDER — WARFARIN SODIUM 7.5 MG PO TABS
7.5000 mg | ORAL_TABLET | Freq: Once | ORAL | Status: AC
  Administered 2017-04-09: 7.5 mg via ORAL
  Filled 2017-04-09: qty 1
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
## 2017-04-09 NOTE — Progress Notes (Signed)
Came to remove the patient's thigh catheter.  She states that she does not want it removed tonight but is okay with having it done in the morning.  We will most likely stop her heparin 1 hour before removing the catheter.  We will coordinate with her nurses regarding timing for removal.  Annamarie Major

## 2017-04-09 NOTE — Progress Notes (Signed)
PROGRESS NOTE    Orit Sanville  MVE:[id number] DOB: 05-Mar-1941 DOA: 04/05/2017 PCP: Darlina Rumpf, MD   Brief Narrative: Ashlee Miles is a 76 y.o. femalewith medical history significant of ESRD (newly diagnosed and started on HD), DM2, HTN, chronic back pain, anemia, and recent TIA presenting with R leg swelling/pain. She was at dialysis today and they called her daughter. The nurse said that her leg was swollen and painful. US showed DVT in the right femoral vein where her temporary dialysis catheter is located. They sent her via ambulance to [facility].   She has an AV graft that is maturing in her right leg. She has temporary venous access in the right femoral vein. She is on MWF HD. Her leg started bothering her ever since the procedure. It has not changed or gotten more red or swollen.   She has had cough since last week. She was started on Mucinex with improvement for just a couple of hours. Prior rhinorrhea but none recently. No reflux noted. She has an IVC filter in place.  She moved here after [facility]. Came on a Tuesday and has been sick since. Admitted initially from 9/12-15for TIA/syncope. Discharged to her daughter's house and admitted again from 9/23-10/3 for acute metabolic encephalopathy andprogressive renal failure. Shestarted HD and was discharged to [facility] and has been there for 2-3 weeks.    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  She has recurrent DVT which is provoked secondary to temporary hemodialysis catheter.  Symptoms have improved. -Continue Coumadin and bridge with  heparin  Cough Recurrent.  Chest x-ray unremarkable.  Productive.  -Tessalon perles -Chest x-ray -RVP  ESRD on hemodialysis She receives hemodialysis on Monday, Wednesday, Friday -Nephrology recommendations  Essential hypertension -Continue Coreg  Diabetes mellitus -Continue Lantus -Continue sliding scale insulin  Anemia Normocytic and chronic.  Likely secondary to anemia of chronic disease.  Previous iron studies in September 2018 suggest anemia of chronic disease.  Hemoglobin currently stable no evidence of bleeding.  Thrombocytopenia Labile.  No evidence of bleeding. -Continue to trend   DVT prophylaxis: Heparin bridge, Coumadin Code Status: DNR Family Communication: None at bedside Disposition Plan: Discharge likely home with daughter   Consultants:   Nephrology  Procedures:   Hemodialysis, MWF  Antimicrobials:  None   Subjective: No events overnight. Cough persistent and productive.  Objective: Vitals:   04/08/17 1806 04/08/17 2138 04/09/17 0422 04/09/17 1102  BP: (!) 133/46 (!) 152/34 (!) 131/39 (!) 123/41  Pulse: 68 70 69 62  Resp: 18 19 16 18   Temp: 98.2 F (36.8 C) 97.7 F (36.5 C) 97.8 F (36.6 C) 97.8 F (36.6 C)  TempSrc: Oral Oral Oral Oral  SpO2: 100% 100% 100% 100%  Weight:  107.5 kg (236 lb 15.9 oz)    Height:        Intake/Output Summary (Last 24 hours) at 04/09/17 1132 Last data filed at 04/09/17 0600  Gross per 24 hour  Intake           624.55 ml  Output  250 ml  Net           374.55 ml   Filed Weights   04/07/17 2231 04/08/17 1112 04/08/17 2138  Weight: 109 kg (240 lb 4.8 oz) 107.5 kg (236 lb 15.9 oz) 107.5 kg (236 lb 15.9 oz)    Examination:  General exam: Appears calm and comfortable  Respiratory system: Respiratory effort normal. Gastrointestinal system: Abdomen is nondistended Central nervous system: Alert and oriented. No focal neurological deficits. Skin: No cyanosis. No rashes Psychiatry:  Judgement and insight appear normal. Mood & affect appropriate.     Data Reviewed: I have personally reviewed following labs and imaging studies  CBC:  Recent Labs Lab 04/05/17 1947 04/06/17 0524 04/07/17 0324 04/08/17 0235 04/09/17 0456  WBC 6.8 6.5 5.2 6.2 6.4  NEUTROABS 3.5  --   --   --   --   HGB 9.2* 8.3* 8.6* 9.6* 8.8*  HCT 28.7* 26.7* 28.0* 30.1* 27.5*  MCV 79.9 80.4 80.9 80.1 79.3  PLT 113* 123* 147* 113* 151*   Basic Metabolic Panel:  Recent Labs Lab 04/05/17 1947 04/06/17 0524 04/07/17 1457  NA 131* 135 134*  K 4.0 4.5 5.1  CL 95* 98* 101  CO2 27 30 26   GLUCOSE 193* 174* 223*  BUN 11 15 36*  CREATININE 2.05* 2.93* 4.67*  CALCIUM 7.8* 8.1* 8.1*  PHOS  --   --  4.3   GFR: Estimated Creatinine Clearance: 11.8 mL/min (A) (by C-G formula based on SCr of 4.67 mg/dL (H)). Liver Function Tests:  Recent Labs Lab 04/07/17 1457  ALBUMIN 2.6*   No results for input(s): LIPASE, AMYLASE in the last 168 hours. No results for input(s): AMMONIA in the last 168 hours. Coagulation Profile:  Recent Labs Lab 04/07/17 1456 04/08/17 0235 04/09/17 0456  INR 1.15 1.07 1.06   Cardiac Enzymes: No results for input(s): CKTOTAL, CKMB, CKMBINDEX, TROPONINI in the last 168 hours. BNP (last 3 results) No results for input(s): PROBNP in the last 8760 hours. HbA1C: No results for input(s): HGBA1C in the last 72 hours. CBG:  Recent Labs Lab 04/08/17 0838 04/08/17 1232 04/08/17 1804 04/08/17 2136 04/09/17 0748  GLUCAP 156* 182* 162* 129* 168*   Lipid Profile: No results for input(s): CHOL, HDL, LDLCALC, TRIG, CHOLHDL, LDLDIRECT in the last 72 hours. Thyroid Function Tests: No results for input(s): TSH, T4TOTAL, FREET4, T3FREE, THYROIDAB in the last 72 hours. Anemia Panel: No results for input(s): VITAMINB12, FOLATE, FERRITIN, TIBC, IRON, RETICCTPCT in the last 72 hours. Sepsis Labs: No results for input(s): PROCALCITON, LATICACIDVEN in the last 168  hours.  No results found for this or any previous visit (from the past 240 hour(s)).       Radiology Studies: No results found.      Scheduled Meds: . aspirin  325 mg Oral Daily  . benzonatate  200 mg Oral TID  . carvedilol  6.25 mg Oral BID  . darbepoetin (ARANESP) injection - DIALYSIS  100 mcg Intravenous Q Fri-HD  . docusate sodium  100 mg Oral BID  . doxercalciferol  2 mcg Intravenous Q M,W,F-HD  . famotidine  20 mg Oral Daily  . feeding supplement (PRO-STAT SUGAR FREE 64)  30 mL Oral BID  . gabapentin  300 mg Oral TID  . insulin aspart  0-5 Units Subcutaneous QHS  . insulin aspart  0-9 Units Subcutaneous TID WC  . insulin glargine  6 Units Subcutaneous Daily  . linaclotide  145 mcg Oral QAC breakfast  . multivitamin  1 tablet Oral QHS  . pantoprazole  40 mg Oral Daily  . simvastatin  40 mg Oral Daily  . warfarin  7.5 mg Oral ONCE-1800  . warfarin   Does not apply Once  . Warfarin - Pharmacist Dosing Inpatient   Does not apply q1800   Continuous Infusions: . heparin 1,050 Units/hr (04/08/17 1946)     LOS: 3 days     Cordelia Poche, MD [facility] 04/09/2017, 11:32 AM Pager: [phone]  If 7PM-7AM, please contact night-coverage [email or web address] Password [email or web address] 04/09/2017, 11:32 AM

## 2017-04-09 NOTE — Progress Notes (Signed)
ANTICOAGULATION CONSULT NOTE  Pharmacy Consult for Heparin and Warfarin  Indication: DVT RLE  No Known Allergies  Patient Measurements: Height: 5\' 1"  (154.9 cm) Weight: 236 lb 15.9 oz (107.5 kg) IBW/kg (Calculated) : 47.8 Heparin Dosing Weight: 73.3  Vital Signs Temp: 97.8 F (36.6 C) (10/21 1102) Temp Source: Oral (10/21 1102) BP: 123/41 (10/21 1102) Pulse Rate: 62 (10/21 1102)  Labs:  Recent Labs  04/07/17 0324 04/07/17 1456 04/07/17 1457 04/08/17 0235 04/09/17 0456  HGB 8.6*  --   --  9.6* 8.8*  HCT 28.0*  --   --  30.1* 27.5*  PLT 147*  --   --  113* 129*  LABPROT  --  14.6  --  13.8 13.8  INR  --  1.15  --  1.07 1.06  HEPARINUNFRC 0.50  --   --  0.69 0.35  CREATININE  --   --  4.67*  --   --     Estimated Creatinine Clearance: 11.8 mL/min (A) (by C-G formula based on SCr of 4.67 mg/dL (H)).   Medical History: Past Medical History:  Diagnosis Date  . CHF (congestive heart failure) ([place])   . Diabetes mellitus without complication ([place])   . ESRD (end stage renal disease) on dialysis ([place])   . History of pituitary adenoma   . Hypertension   . Protein C deficiency ([place])        Assessment: 19 YOF admitted from imaging facility with DVT in right thigh. Of note, had AV fistula placed  in left thigh and right femoral TDC placed  on 03/16/17.  Not on anticoagulation PTA.  ESRD MWF.   Heparin level therapeutic INR 1.06 today, very slow to trend up No bleeding noted  Goal of Therapy:  Heparin level 0.3-0.7 units/ml Monitor platelets by anticoagulation protocol: Yes  INR = 2 to 3   Plan:  Continue heparin at 1050 units / hr Daily heparin level/CBC Warfarin 7.5 mg po x 1 today at 1800 pm  Thank you Anette Guarneri, PharmD [phone] 04/09/2017 11:16 AM

## 2017-04-10 DIAGNOSIS — Z992 Dependence on renal dialysis: Secondary | ICD-10-CM

## 2017-04-10 DIAGNOSIS — N186 End stage renal disease: Secondary | ICD-10-CM

## 2017-04-10 LAB — RESPIRATORY PANEL BY PCR

## 2017-04-10 LAB — RENAL FUNCTION PANEL
Albumin: 2.7 g/dL — ABNORMAL LOW (ref 3.5–5.0)
Anion gap: 8 (ref 5–15)
BUN: 39 mg/dL — ABNORMAL HIGH (ref 6–20)
CO2: 27 mmol/L (ref 22–32)
Calcium: 8.5 mg/dL — ABNORMAL LOW (ref 8.9–10.3)
Chloride: 102 mmol/L (ref 101–111)
Creatinine, Ser: 5.04 mg/dL — ABNORMAL HIGH (ref 0.44–1.00)
GFR calc Af Amer: 9 mL/min — ABNORMAL LOW (ref >60)
GFR calc non Af Amer: 8 mL/min — ABNORMAL LOW (ref >60)
Glucose, Bld: 215 mg/dL — ABNORMAL HIGH (ref 65–99)
Phosphorus: 3.1 mg/dL (ref 2.5–4.6)
Potassium: 4.1 mmol/L (ref 3.5–5.1)
Sodium: 137 mmol/L (ref 135–145)

## 2017-04-10 LAB — CBC
HCT: 28 % — ABNORMAL LOW (ref 36.0–46.0)
HCT: 28.3 % — ABNORMAL LOW (ref 36.0–46.0)
Hemoglobin: 9 g/dL — ABNORMAL LOW (ref 12.0–15.0)
Hemoglobin: 9 g/dL — ABNORMAL LOW (ref 12.0–15.0)
MCH: 25.3 pg — ABNORMAL LOW (ref 26.0–34.0)
MCH: 25.4 pg — ABNORMAL LOW (ref 26.0–34.0)
MCHC: 31.8 g/dL (ref 30.0–36.0)
MCHC: 32.1 g/dL (ref 30.0–36.0)
MCV: 79.1 fL (ref 78.0–100.0)
MCV: 79.5 fL (ref 78.0–100.0)
Platelets: 147 10*3/uL — ABNORMAL LOW (ref 150–400)
Platelets: 218 10*3/uL (ref 150–400)
RBC: 3.54 MIL/uL — ABNORMAL LOW (ref 3.87–5.11)
RBC: 3.56 MIL/uL — ABNORMAL LOW (ref 3.87–5.11)
RDW: 16.5 % — ABNORMAL HIGH (ref 11.5–15.5)
RDW: 16.5 % — ABNORMAL HIGH (ref 11.5–15.5)
WBC: 6.4 10*3/uL (ref 4.0–10.5)
WBC: 7.2 10*3/uL (ref 4.0–10.5)

## 2017-04-10 LAB — HEPARIN LEVEL (UNFRACTIONATED)
Heparin Unfractionated: 0.34 IU/mL (ref 0.30–0.70)
Heparin Unfractionated: 0.39 IU/mL (ref 0.30–0.70)

## 2017-04-10 LAB — GLUCOSE, CAPILLARY
Glucose-Capillary: 156 mg/dL — ABNORMAL HIGH (ref 65–99)
Glucose-Capillary: 176 mg/dL — ABNORMAL HIGH (ref 65–99)
Glucose-Capillary: 208 mg/dL — ABNORMAL HIGH (ref 65–99)
Glucose-Capillary: 211 mg/dL — ABNORMAL HIGH (ref 65–99)

## 2017-04-10 LAB — PROTIME-INR
INR: 1.14
Prothrombin Time: 14.6 seconds (ref 11.4–15.2)

## 2017-04-10 MED ORDER — LIDOCAINE HCL (PF) 1 % IJ SOLN
INTRAMUSCULAR | Status: AC
  Filled 2017-04-10: qty 30

## 2017-04-10 MED ORDER — HEPARIN SODIUM (PORCINE) 1000 UNIT/ML DIALYSIS
4000.0000 [IU] | Freq: Once | INTRAMUSCULAR | Status: DC
Start: 2017-04-11 — End: 2017-04-12

## 2017-04-10 MED ORDER — HYDROCODONE-ACETAMINOPHEN 5-325 MG PO TABS
1.0000 | ORAL_TABLET | Freq: Four times a day (QID) | ORAL | Status: DC | PRN
  Administered 2017-04-10: 1 via ORAL
  Administered 2017-04-11: 2 via ORAL
  Administered 2017-04-11: 1 via ORAL
  Administered 2017-04-12: 2 via ORAL
  Filled 2017-04-10: qty 1
  Filled 2017-04-10 (×2): qty 2
  Filled 2017-04-10: qty 1

## 2017-04-10 MED ORDER — SODIUM CHLORIDE 0.9 % IV SOLN: 100.0000 mL | INTRAVENOUS | Status: DC | PRN

## 2017-04-10 MED ORDER — HEPARIN SODIUM (PORCINE) 1000 UNIT/ML DIALYSIS: 1000.0000 [IU] | INTRAMUSCULAR | Status: DC | PRN

## 2017-04-10 MED ORDER — LIDOCAINE-PRILOCAINE 2.5-2.5 % EX CREA: 1.0000 "application " | TOPICAL_CREAM | CUTANEOUS | Status: DC | PRN

## 2017-04-10 MED ORDER — ALTEPLASE 2 MG IJ SOLR: 2.0000 mg | Freq: Once | INTRAMUSCULAR | Status: DC | PRN

## 2017-04-10 MED ORDER — PENTAFLUOROPROP-TETRAFLUOROETH EX AERO: 1.0000 "application " | INHALATION_SPRAY | CUTANEOUS | Status: DC | PRN

## 2017-04-10 MED ORDER — WARFARIN SODIUM 7.5 MG PO TABS
7.5000 mg | ORAL_TABLET | Freq: Once | ORAL | Status: AC
  Administered 2017-04-10: 7.5 mg via ORAL
  Filled 2017-04-10: qty 1

## 2017-04-10 MED ORDER — LIDOCAINE HCL (PF) 1 % IJ SOLN: 5.0000 mL | INTRAMUSCULAR | Status: DC | PRN
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
## 2017-04-10 NOTE — Progress Notes (Signed)
[facility] Catheter Removal Procedure Note  Diagnosis: ESRD with Functioning AVG left thigh   Plan:  Remove right femoral diatek catheter  Consent signed:  yes Time out completed:  yes Coumadin:  Yes.   PT/INR (if applicable):  8.82, Heparin stopped 1 hour prior to pulling catheter Other labs:   Procedure: 1.  Sterile prepping and draping over catheter area 2. 0 ml 2% lidocaine plain instilled at removal site. 3.  right catheter removed in its entirety with cuff in tact. 4.  Complications: none  5. Tip of catheter sent for culture:  no   Patient tolerated procedure well:  yes Pressure held for 15 minuets , no bleeding noted, dressing applied Instructions given to the pt regarding wound care and bleeding.  [facility] 04/10/2017 9:52 AM

## 2017-04-10 NOTE — Progress Notes (Signed)
PROGRESS NOTE    Ashlee Miles  HGD:[id number] DOB: 1940/09/09 DOA: 04/05/2017 PCP: Darlina Rumpf, MD   Brief Narrative: Ashlee Miles is a 76 y.o. femalewith medical history significant of ESRD (newly diagnosed and started on HD), DM2, HTN, chronic back pain, anemia, and recent TIA presenting with R leg swelling/pain. She was at dialysis today and they called her daughter. The nurse said that her leg was swollen and painful. US showed DVT in the right femoral vein where her temporary dialysis catheter is located. They sent her via ambulance to [facility].   She has an AV graft that is maturing in her right leg. She has temporary venous access in the right femoral vein. She is on MWF HD. Her leg started bothering her ever since the procedure. It has not changed or gotten more red or swollen.   She has had cough since last week. She was started on Mucinex with improvement for just a couple of hours. Prior rhinorrhea but none recently. No reflux noted. She has an IVC filter in place.  She moved here after [facility]. Came on a Tuesday and has been sick since. Admitted initially from 9/12-15for TIA/syncope. Discharged to her daughter's house and admitted again from 9/23-10/3 for acute metabolic encephalopathy andprogressive renal failure. Shestarted HD and was discharged to [facility] and has been there for 2-3 weeks.    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  She has recurrent DVT which is provoked secondary to temporary hemodialysis catheter.  Symptoms have improved. Still with some pain -Continue Coumadin and bridge with  heparin  Cough Recurrent.  Chest x-ray unremarkable.  Productive. Chest x-ray unremarkable. -Tessalon perles -RVP pending  ESRD on hemodialysis She receives hemodialysis on Monday, Wednesday, Friday. Thigh catheter removed this morning by vascular surgery -Nephrology recommendations  Essential hypertension -Continue Coreg  Diabetes mellitus -Continue Lantus -Continue sliding scale insulin  Anemia Normocytic and chronic.  Likely secondary to anemia of chronic disease.  Previous iron studies in September 2018 suggest anemia of chronic disease.  Hemoglobin currently stable no evidence of bleeding.  Thrombocytopenia Labile.  No evidence of bleeding. -Continue to trend   DVT prophylaxis: Heparin bridge, Coumadin Code Status: DNR Family Communication: None at bedside Disposition Plan: Discharge likely home with daughter   Consultants:   Nephrology  Procedures:   Hemodialysis, MWF  Antimicrobials:  None   Subjective: No events overnight. Cough persistent and productive.  Objective: Vitals:   04/09/17 1102 04/09/17 2044 04/10/17 0500 04/10/17 0955  BP: (!) 123/41 (!) 136/39 (!) 135/42 (!) 134/40  Pulse: 62 65 69 68  Resp: 18 16 16 16   Temp: 97.8 F (36.6 C) 98.4 F (36.9 C) 98.6 F (37 C)   TempSrc: Oral Oral Oral   SpO2: 100% 100% 98% 100%  Weight:  107.6 kg (237 lb 3.4 oz)    Height:        Intake/Output Summary (Last 24 hours) at 04/10/17 1031 Last data filed at 04/10/17 1000  Gross per 24 hour  Intake           941.45 ml  Output  400 ml  Net           541.45 ml   Filed Weights   04/08/17 1112 04/08/17 2138 04/09/17 2044  Weight: 107.5 kg (236 lb 15.9 oz) 107.5 kg (236 lb 15.9 oz) 107.6 kg (237 lb 3.4 oz)    Examination:  General exam: Appears calm and comfortable  Respiratory system: Respiratory effort normal. Gastrointestinal system: Abdomen is nondistended Central nervous system: Alert and oriented. No focal neurological  deficits. Skin: No cyanosis. No rashes Psychiatry: Judgement and insight appear normal. Mood & affect appropriate.     Data Reviewed: I have personally reviewed following labs and imaging studies  CBC:  Recent Labs Lab 04/05/17 1947 04/06/17 0524 04/07/17 0324 04/08/17 0235 04/09/17 0456 04/10/17 0429  WBC 6.8 6.5 5.2 6.2 6.4 6.4  NEUTROABS 3.5  --   --   --   --   --   HGB 9.2* 8.3* 8.6* 9.6* 8.8* 9.0*  HCT 28.7* 26.7* 28.0* 30.1* 27.5* 28.0*  MCV 79.9 80.4 80.9 80.1 79.3 79.1  PLT 113* 123* 147* 113* 129* 144*   Basic Metabolic Panel:  Recent Labs Lab 04/05/17 1947 04/06/17 0524 04/07/17 1457  NA 131* 135 134*  K 4.0 4.5 5.1  CL 95* 98* 101  CO2 27 30 26   GLUCOSE 193* 174* 223*  BUN 11 15 36*  CREATININE 2.05* 2.93* 4.67*  CALCIUM 7.8* 8.1* 8.1*  PHOS  --   --  4.3   GFR: Estimated Creatinine Clearance: 11.8 mL/min (A) (by C-G formula based on SCr of 4.67 mg/dL (H)). Liver Function Tests:  Recent Labs Lab 04/07/17 1457  ALBUMIN 2.6*   No results for input(s): LIPASE, AMYLASE in the last 168 hours. No results for input(s): AMMONIA in the last 168 hours. Coagulation Profile:  Recent Labs Lab 04/07/17 1456 04/08/17 0235 04/09/17 0456 04/10/17 0429  INR 1.15 1.07 1.06 1.14   Cardiac Enzymes: No results for input(s): CKTOTAL, CKMB, CKMBINDEX, TROPONINI in the last 168 hours. BNP (last 3 results) No results for input(s): PROBNP in the last 8760 hours. HbA1C: No results for input(s): HGBA1C in the last 72 hours. CBG:  Recent Labs Lab 04/09/17 0748 04/09/17 1218 04/09/17 1831 04/09/17 2114 04/10/17 0813  GLUCAP 168* 180* 225* 287* 156*   Lipid Profile: No results for input(s): CHOL, HDL, LDLCALC, TRIG, CHOLHDL, LDLDIRECT in the last 72 hours. Thyroid Function Tests: No results for input(s): TSH, T4TOTAL, FREET4, T3FREE, THYROIDAB in the last 72 hours. Anemia Panel: No results for input(s): VITAMINB12, FOLATE, FERRITIN, TIBC, IRON,  RETICCTPCT in the last 72 hours. Sepsis Labs: No results for input(s): PROCALCITON, LATICACIDVEN in the last 168 hours.  No results found for this or any previous visit (from the past 240 hour(s)).       Radiology Studies: Dg Chest 2 View  Result Date: 04/09/2017 CLINICAL DATA:  Productive cough since last week EXAM: CHEST  2 VIEW COMPARISON:  CT chest 04/05/2017 FINDINGS: Right-sided Port-A-Cath with the tip projecting over the SVC. There is no focal parenchymal opacity. There is no pleural effusion or pneumothorax. The heart and mediastinal contours are unremarkable. The osseous structures are unremarkable. IMPRESSION: No active cardiopulmonary disease. Electronically Signed   By: Kathreen Devoid   On: 04/09/2017 14:51        Scheduled Meds: . aspirin  325 mg Oral Daily  . benzonatate  200 mg Oral TID  . carvedilol  6.25 mg Oral BID  . darbepoetin (ARANESP) injection - DIALYSIS  100 mcg  Intravenous Q Fri-HD  . docusate sodium  100 mg Oral BID  . doxercalciferol  2 mcg Intravenous Q M,W,F-HD  . famotidine  20 mg Oral Daily  . feeding supplement (PRO-STAT SUGAR FREE 64)  30 mL Oral BID  . gabapentin  300 mg Oral TID  . insulin aspart  0-5 Units Subcutaneous QHS  . insulin aspart  0-9 Units Subcutaneous TID WC  . insulin glargine  6 Units Subcutaneous Daily  . lidocaine (PF)      . linaclotide  145 mcg Oral QAC breakfast  . multivitamin  1 tablet Oral QHS  . pantoprazole  40 mg Oral Daily  . simvastatin  40 mg Oral Daily  . warfarin  7.5 mg Oral ONCE-1800  . Warfarin - Pharmacist Dosing Inpatient   Does not apply q1800   Continuous Infusions: . heparin 1,050 Units/hr (04/09/17 2028)     LOS: 4 days     Cordelia Poche, MD [facility] 04/10/2017, 10:31 AM Pager: [phone]  If 7PM-7AM, please contact night-coverage [email or web address] Password [email or web address] 04/10/2017, 10:31 AM

## 2017-04-10 NOTE — Progress Notes (Signed)
[place] for Heparin Indication: DVT RLE  No Known Allergies  Patient Measurements: Height: 5\' 1"  (154.9 cm) Weight: 240 lb 4.8 oz (109 kg) IBW/kg (Calculated) : 47.8 Heparin Dosing Weight: 73.3  Vital Signs Temp: 98.3 F (36.8 C) (10/22 2018) Temp Source: Oral (10/22 1704) BP: 142/45 (10/22 2018) Pulse Rate: 74 (10/22 2018)  Labs:  Recent Labs  04/08/17 0235 04/09/17 0456 04/10/17 0429 04/10/17 1937  HGB 9.6* 8.8* 9.0*  --   HCT 30.1* 27.5* 28.0*  --   PLT 113* 129* 147*  --   LABPROT 13.8 13.8 14.6  --   INR 1.07 1.06 1.14  --   HEPARINUNFRC 0.69 0.35 0.34 0.39    Estimated Creatinine Clearance: 11.9 mL/min (A) (by C-G formula based on SCr of 4.67 mg/dL (H)).   Assessment: 61 YOF admitted from imaging facility with DVT in right thigh. Hx of PE with IVC filter. Of note, had AV fistula placed  in left thigh and right femoral TDC placed on 03/16/17. Not on anticoagulation PTA. ESRD MWF.   Heparin level remains therapeutic at 0.39. No bleeding noted.  Goal of Therapy:  Heparin level 0.3-0.7 units/ml Monitor platelets by anticoagulation protocol: Yes    Plan:  Continue heparin drip at 1050 units/hr Daily heparin level/CBC/INR Monitor for s/sx bleeding Decrease asa to 81 mg when INR therapeutic?   Renold Genta, PharmD, BCPS Clinical Pharmacist Phone for today - [place] - [phone] 04/10/2017 8:21 PM

## 2017-04-10 NOTE — Progress Notes (Addendum)
[place] for Heparin and Warfarin  Indication: DVT RLE  No Known Allergies  Patient Measurements: Height: 5\' 1"  (154.9 cm) Weight: 237 lb 3.4 oz (107.6 kg) IBW/kg (Calculated) : 47.8 Heparin Dosing Weight: 73.3  Vital Signs Temp: 98.6 F (37 C) (10/22 0500) Temp Source: Oral (10/22 0500) BP: 135/42 (10/22 0500) Pulse Rate: 69 (10/22 0500)  Labs:  Recent Labs  04/07/17 1457  04/08/17 0235 04/09/17 0456 04/10/17 0429  HGB  --   < > 9.6* 8.8* 9.0*  HCT  --   --  30.1* 27.5* 28.0*  PLT  --   --  113* 129* 147*  LABPROT  --   --  13.8 13.8 14.6  INR  --   --  1.07 1.06 1.14  HEPARINUNFRC  --   --  0.69 0.35 0.34  CREATININE 4.67*  --   --   --   --   < > = values in this interval not displayed.  Estimated Creatinine Clearance: 11.8 mL/min (A) (by C-G formula based on SCr of 4.67 mg/dL (H)).   Medical History: Past Medical History:  Diagnosis Date  . CHF (congestive heart failure) ([place])   . Diabetes mellitus without complication ([place])   . ESRD (end stage renal disease) on dialysis ([place])   . History of pituitary adenoma   . Hypertension   . Protein C deficiency ([place])        Assessment: 54 YOF admitted from imaging facility with DVT in right thigh. Hx of PE with IVC filter. Of note, had AV fistula placed  in left thigh and right femoral TDC placed on 03/16/17. Not on anticoagulation PTA. ESRD MWF.   Heparin level remains therapeutic INR 1.14 today, very slow to trend up No bleeding noted  Per Vascular Surgery, likely to have thigh catheter removed 10/22 AM and will stop heparin 1hr prior to procedure.  Goal of Therapy:  Heparin level 0.3-0.7 units/ml Monitor platelets by anticoagulation protocol: Yes  INR 2 to 3   Plan:  Continue heparin at 1050 units / hr Warfarin 7.5 mg po x 1 (may need to further increase if INR not increasing tomorrow) Daily heparin level/CBC/INR Monitor for s/sx bleeding Decrease asa to 81 mg  when INR therapeutic?   Elicia Lamp, PharmD, BCPS Clinical Pharmacist 04/10/2017 8:47 AM    ADDENDUM:  Heparin held around dialysis catheter removal this AM, resumed at previous therapeutic rate at ~1000 per RN. Will schedule heparin level in 8 hours to confirm remains therapeutic.   Elicia Lamp, PharmD, BCPS Clinical Pharmacist Rx Phone # for today: [phone] After 3:30PM, please call Main Rx: [id number] 04/10/2017 11:05 AM

## 2017-04-11 LAB — CBC
HCT: 28.3 % — ABNORMAL LOW (ref 36.0–46.0)
Hemoglobin: 9 g/dL — ABNORMAL LOW (ref 12.0–15.0)
MCH: 25.5 pg — ABNORMAL LOW (ref 26.0–34.0)
MCHC: 31.8 g/dL (ref 30.0–36.0)
MCV: 80.2 fL (ref 78.0–100.0)
Platelets: 152 10*3/uL (ref 150–400)
RBC: 3.53 MIL/uL — ABNORMAL LOW (ref 3.87–5.11)
RDW: 16.6 % — ABNORMAL HIGH (ref 11.5–15.5)
WBC: 7.5 10*3/uL (ref 4.0–10.5)

## 2017-04-11 LAB — GLUCOSE, CAPILLARY
Glucose-Capillary: 190 mg/dL — ABNORMAL HIGH (ref 65–99)
Glucose-Capillary: 289 mg/dL — ABNORMAL HIGH (ref 65–99)
Glucose-Capillary: 246 mg/dL — ABNORMAL HIGH (ref 65–99)
Glucose-Capillary: 211 mg/dL — ABNORMAL HIGH (ref 65–99)
Glucose-Capillary: 251 mg/dL — ABNORMAL HIGH (ref 65–99)

## 2017-04-11 LAB — PROTIME-INR
INR: 1.65
Prothrombin Time: 19.3 seconds — ABNORMAL HIGH (ref 11.4–15.2)

## 2017-04-11 LAB — HEPARIN LEVEL (UNFRACTIONATED): Heparin Unfractionated: 0.46 IU/mL (ref 0.30–0.70)

## 2017-04-11 MED ORDER — INSULIN GLARGINE 100 UNIT/ML ~~LOC~~ SOLN
8.0000 [IU] | Freq: Every day | SUBCUTANEOUS | Status: DC
  Administered 2017-04-11 – 2017-04-12 (×2): 8 [IU] via SUBCUTANEOUS
  Filled 2017-04-11 (×2): qty 0.08

## 2017-04-11 MED ORDER — DOXERCALCIFEROL 4 MCG/2ML IV SOLN
INTRAVENOUS | Status: AC
  Administered 2017-04-11: 2 ug via INTRAVENOUS
  Filled 2017-04-11: qty 2

## 2017-04-11 MED ORDER — MIDODRINE HCL 5 MG PO TABS
10.0000 mg | ORAL_TABLET | ORAL | Status: DC
  Administered 2017-04-12: 10 mg via ORAL

## 2017-04-11 MED ORDER — WARFARIN SODIUM 7.5 MG PO TABS
7.5000 mg | ORAL_TABLET | Freq: Once | ORAL | Status: AC
  Administered 2017-04-11: 7.5 mg via ORAL
  Filled 2017-04-11: qty 1

## 2017-04-11 NOTE — Progress Notes (Signed)
HD tx completed @ 0250 w/ bp issues throughout most of tx requiring UF off for some of tx and 264m NS bolus x2 before bp would come up. Pt states this happens every time that her bp will just all the sudden drop and then come back up but as I told her, every time I tried to pull fluid again it would drop again. UF goal not met, blood rinsed back, report called to NPercell [place] RN

## 2017-04-11 NOTE — Progress Notes (Signed)
HD tx initiated via 17G x3 w/o problem, pull/push/flush equally w/o problem, first attempt at venous cannulation was not successful, no blood return. VSS, will cont to monitor while on HD tx

## 2017-04-11 NOTE — Progress Notes (Signed)
PROGRESS NOTE    Natika Geyer  LHT:[id number] DOB: 10-12-1940 DOA: 04/05/2017 PCP: Darlina Rumpf, MD   Brief Narrative: Ashlee Miles is a 76 y.o. femalewith medical history significant of ESRD (newly diagnosed and started on HD), DM2, HTN, chronic back pain, anemia, and recent TIA presenting with R leg swelling/pain. She was at dialysis today and they called her daughter. The nurse said that her leg was swollen and painful. US showed DVT in the right femoral vein where her temporary dialysis catheter is located. They sent her via ambulance to [facility].   She has an AV graft that is maturing in her right leg. She has temporary venous access in the right femoral vein. She is on MWF HD. Her leg started bothering her ever since the procedure. It has not changed or gotten more red or swollen.   She has had cough since last week. She was started on Mucinex with improvement for just a couple of hours. Prior rhinorrhea but none recently. No reflux noted. She has an IVC filter in place.  She moved here after [facility]. Came on a Tuesday and has been sick since. Admitted initially from 9/12-15for TIA/syncope. Discharged to her daughter's house and admitted again from 9/23-10/3 for acute metabolic encephalopathy andprogressive renal failure. Shestarted HD and was discharged to [facility] and has been there for 2-3 weeks.    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  She has recurrent DVT which is provoked secondary to temporary hemodialysis catheter.  Symptoms have improved. Still with some pain -Continue Coumadin and bridge with  heparin  Cough Recurrent.  Chest x-ray unremarkable.  Productive. Chest x-ray unremarkable. RVP negative -Tessalon perles  ESRD on hemodialysis She receives hemodialysis on Monday, Wednesday, Friday. Thigh catheter removed this morning by vascular surgery. Hemodialysis through left thigh AVG -Nephrology recommendations  Essential hypertension -Continue Coreg  Diabetes mellitus -Continue Lantus -Continue sliding scale insulin  Anemia Normocytic and chronic.  Likely secondary to anemia of chronic disease.  Previous iron studies in September 2018 suggest anemia of chronic disease.  Hemoglobin currently stable no evidence of bleeding.  Thrombocytopenia Labile.  No evidence of bleeding. -Continue to trend   DVT prophylaxis: Heparin bridge, Coumadin Code Status: DNR Family Communication: None at bedside Disposition Plan: Discharge to SNF pending therapeutic INR x2   Consultants:   Nephrology  Procedures:   Hemodialysis, MWF  Antimicrobials:  None   Subjective: Afebrile.  Objective: Vitals:   04/11/17 0230 04/11/17 0250 04/11/17 0327 04/11/17 0429  BP: (!) 94/43 (!) 119/43 (!) 143/99 (!) 122/39  Pulse: 65 68 67 73  Resp: 14 16 17 18   Temp:   97.9 F (36.6 C) 98.1 F (36.7 C)  TempSrc:   Oral   SpO2: 98% 97% 100% 99%  Weight:   105.6 kg (232 lb 12.9 oz)   Height:        Intake/Output Summary (Last 24 hours) at 04/11/17 0933 Last data filed at 04/11/17 0927  Gross per 24 hour  Intake              460 ml  Output  3827 ml  Net            -3367 ml   Filed Weights   04/10/17 2018 04/10/17 2306 04/11/17 0327  Weight: 109 kg (240 lb 4.8 oz) 108.6 kg (239 lb 6.7 oz) 105.6 kg (232 lb 12.9 oz)    Examination:  General exam: Appears calm and comfortable  Respiratory system: Respiratory effort normal. Gastrointestinal system: Abdomen is nondistended Musculoskeletal: right leg is swollen/edematous Psychiatry: Judgement and insight appear normal.  Mood & affect appropriate.     Data Reviewed: I have personally reviewed following labs and imaging studies  CBC:  Recent Labs Lab 04/05/17 1947  04/07/17 0324 04/08/17 0235 04/09/17 0456 04/10/17 0429 04/10/17 2337  WBC 6.8  < > 5.2 6.2 6.4 6.4 7.2  NEUTROABS 3.5  --   --   --   --   --   --   HGB 9.2*  < > 8.6* 9.6* 8.8* 9.0* 9.0*  HCT 28.7*  < > 28.0* 30.1* 27.5* 28.0* 28.3*  MCV 79.9  < > 80.9 80.1 79.3 79.1 79.5  PLT 113*  < > 147* 113* 129* 147* 218  < > = values in this interval not displayed. Basic Metabolic Panel:  Recent Labs Lab 04/05/17 1947 04/06/17 0524 04/07/17 1457 04/10/17 2337  NA 131* 135 134* 137  K 4.0 4.5 5.1 4.1  CL 95* 98* 101 102  CO2 27 30 26 27   GLUCOSE 193* 174* 223* 215*  BUN 11 15 36* 39*  CREATININE 2.05* 2.93* 4.67* 5.04*  CALCIUM 7.8* 8.1* 8.1* 8.5*  PHOS  --   --  4.3 3.1   GFR: Estimated Creatinine Clearance: 10.8 mL/min (A) (by C-G formula based on SCr of 5.04 mg/dL (H)). Liver Function Tests:  Recent Labs Lab 04/07/17 1457 04/10/17 2337  ALBUMIN 2.6* 2.7*   No results for input(s): LIPASE, AMYLASE in the last 168 hours. No results for input(s): AMMONIA in the last 168 hours. Coagulation Profile:  Recent Labs Lab 04/07/17 1456 04/08/17 0235 04/09/17 0456 04/10/17 0429  INR 1.15 1.07 1.06 1.14   Cardiac Enzymes: No results for input(s): CKTOTAL, CKMB, CKMBINDEX, TROPONINI in the last 168 hours. BNP (last 3 results) No results for input(s): PROBNP in the last 8760 hours. HbA1C: No results for input(s): HGBA1C in the last 72 hours. CBG:  Recent Labs Lab 04/10/17 1157 04/10/17 1703 04/10/17 2202 04/11/17 0428 04/11/17 0741  GLUCAP 176* 208* 211* 190* 289*   Lipid Profile: No results for input(s): CHOL, HDL, LDLCALC, TRIG, CHOLHDL, LDLDIRECT in the last 72 hours. Thyroid Function Tests: No results for input(s): TSH, T4TOTAL, FREET4, T3FREE, THYROIDAB in the last 72 hours. Anemia Panel: No results for  input(s): VITAMINB12, FOLATE, FERRITIN, TIBC, IRON, RETICCTPCT in the last 72 hours. Sepsis Labs: No results for input(s): PROCALCITON, LATICACIDVEN in the last 168 hours.  Recent Results (from the past 240 hour(s))  Respiratory Panel by PCR     Status: None   Collection Time: 04/09/17 10:29 PM  Result Value Ref Range Status   Adenovirus NOT DETECTED NOT DETECTED Final   Coronavirus 229E NOT DETECTED NOT DETECTED Final   Coronavirus HKU1 NOT DETECTED NOT DETECTED Final   Coronavirus NL63 NOT DETECTED NOT DETECTED Final   Coronavirus OC43 NOT DETECTED NOT DETECTED Final   Metapneumovirus NOT DETECTED NOT DETECTED Final   Rhinovirus / Enterovirus NOT DETECTED NOT DETECTED Final   Influenza A NOT DETECTED NOT DETECTED Final   Influenza B NOT DETECTED NOT DETECTED Final  Parainfluenza Virus 1 NOT DETECTED NOT DETECTED Final   Parainfluenza Virus 2 NOT DETECTED NOT DETECTED Final   Parainfluenza Virus 3 NOT DETECTED NOT DETECTED Final   Parainfluenza Virus 4 NOT DETECTED NOT DETECTED Final   Respiratory Syncytial Virus NOT DETECTED NOT DETECTED Final   Bordetella pertussis NOT DETECTED NOT DETECTED Final   Chlamydophila pneumoniae NOT DETECTED NOT DETECTED Final   Mycoplasma pneumoniae NOT DETECTED NOT DETECTED Final         Radiology Studies: Dg Chest 2 View  Result Date: 04/09/2017 CLINICAL DATA:  Productive cough since last week EXAM: CHEST  2 VIEW COMPARISON:  CT chest 04/05/2017 FINDINGS: Right-sided Port-A-Cath with the tip projecting over the SVC. There is no focal parenchymal opacity. There is no pleural effusion or pneumothorax. The heart and mediastinal contours are unremarkable. The osseous structures are unremarkable. IMPRESSION: No active cardiopulmonary disease. Electronically Signed   By: Kathreen Devoid   On: 04/09/2017 14:51        Scheduled Meds: . aspirin  325 mg Oral Daily  . benzonatate  200 mg Oral TID  . carvedilol  6.25 mg Oral BID  . darbepoetin  (ARANESP) injection - DIALYSIS  100 mcg Intravenous Q Fri-HD  . docusate sodium  100 mg Oral BID  . doxercalciferol  2 mcg Intravenous Q M,W,F-HD  . famotidine  20 mg Oral Daily  . feeding supplement (PRO-STAT SUGAR FREE 64)  30 mL Oral BID  . gabapentin  300 mg Oral TID  . heparin  4,000 Units Dialysis Once in dialysis  . insulin aspart  0-5 Units Subcutaneous QHS  . insulin aspart  0-9 Units Subcutaneous TID WC  . insulin glargine  6 Units Subcutaneous Daily  . linaclotide  145 mcg Oral QAC breakfast  . multivitamin  1 tablet Oral QHS  . pantoprazole  40 mg Oral Daily  . simvastatin  40 mg Oral Daily  . Warfarin - Pharmacist Dosing Inpatient   Does not apply q1800   Continuous Infusions: . sodium chloride    . sodium chloride    . heparin 1,050 Units/hr (04/10/17 1838)     LOS: 5 days     Cordelia Poche, MD [facility] 04/11/2017, 9:33 AM Pager: [phone]  If 7PM-7AM, please contact night-coverage [email or web address] Password [facility] 04/11/2017, 9:33 AM

## 2017-04-11 NOTE — Progress Notes (Signed)
[facility] Progress Note   Subjective:   Patient resting in bed, tired, was in HD until 5am, but tolerated treatment well. States she just feels bad, no specific complaint.  Cough improving.   Objective Vitals:   04/11/17 0250 04/11/17 0327 04/11/17 0429 04/11/17 1007  BP: (!) 119/43 (!) 143/99 (!) 122/39 (!) 99/36  Pulse: 68 67 73 72  Resp: 16 17 18 18   Temp:  97.9 F (36.6 C) 98.1 F (36.7 C) 97.7 F (36.5 C)  TempSrc:  Oral  Oral  SpO2: 97% 100% 99% 98%  Weight:  105.6 kg (232 lb 12.9 oz)    Height:       Physical Exam NAD, chronically ill appearing BF. RRR, hard to appreciate due to body habitus (heart sounds distant) Lungs clear  Large pannus, soft NT abdomen No LE edema Dialysis Access: L thigh AVG - +bruit/thrill   Filed Weights   04/10/17 2018 04/10/17 2306 04/11/17 0327  Weight: 109 kg (240 lb 4.8 oz) 108.6 kg (239 lb 6.7 oz) 105.6 kg (232 lb 12.9 oz)    Intake/Output Summary (Last 24 hours) at 04/11/17 1108 Last data filed at 04/11/17 0927  Gross per 24 hour  Intake              460 ml  Output             3727 ml  Net            -3267 ml     Recent Labs Lab 04/06/17 0524 04/07/17 1457 04/10/17 2337  NA 135 134* 137  K 4.5 5.1 4.1  CL 98* 101 102  CO2 30 26 27   GLUCOSE 174* 223* 215*  BUN 15 36* 39*  CREATININE 2.93* 4.67* 5.04*  CALCIUM 8.1* 8.1* 8.5*  PHOS  --  4.3 3.1    Recent Labs Lab 04/07/17 1457 04/10/17 2337  ALBUMIN 2.6* 2.7*    Recent Labs Lab 04/05/17 1947  04/07/17 0324 04/08/17 0235 04/09/17 0456 04/10/17 0429 04/10/17 2337  WBC 6.8  < > 5.2 6.2 6.4 6.4 7.2  NEUTROABS 3.5  --   --   --   --   --   --   HGB 9.2*  < > 8.6* 9.6* 8.8* 9.0* 9.0*  HCT 28.7*  < > 28.0* 30.1* 27.5* 28.0* 28.3*  MCV 79.9  < > 80.9 80.1 79.3 79.1 79.5  PLT 113*  < > 147* 113* 129* 147* 218  < > = values in this interval not displayed.    Recent Labs Lab 04/10/17 1157 04/10/17 1703 04/10/17 2202 04/11/17 0428  04/11/17 0741  GLUCAP 176* 208* 211* 190* 289*    Lab Results  Component Value Date   INR 1.14 04/10/2017   INR 1.06 04/09/2017   INR 1.07 04/08/2017   Studies/Results: Dg Chest 2 View  Result Date: 04/09/2017 CLINICAL DATA:  Productive cough since last week EXAM: CHEST  2 VIEW COMPARISON:  CT chest 04/05/2017 FINDINGS: Right-sided Port-A-Cath with the tip projecting over the SVC. There is no focal parenchymal opacity. There is no pleural effusion or pneumothorax. The heart and mediastinal contours are unremarkable. The osseous structures are unremarkable. IMPRESSION: No active cardiopulmonary disease. Electronically Signed   By: Kathreen Devoid   On: 04/09/2017 14:51    Medications: . sodium chloride    . sodium chloride    . heparin 1,050 Units/hr (04/10/17 1838)   . aspirin  325 mg Oral Daily  . benzonatate  200  mg Oral TID  . carvedilol  6.25 mg Oral BID  . darbepoetin (ARANESP) injection - DIALYSIS  100 mcg Intravenous Q Fri-HD  . docusate sodium  100 mg Oral BID  . doxercalciferol  2 mcg Intravenous Q M,W,F-HD  . famotidine  20 mg Oral Daily  . feeding supplement (PRO-STAT SUGAR FREE 64)  30 mL Oral BID  . gabapentin  300 mg Oral TID  . heparin  4,000 Units Dialysis Once in dialysis  . insulin aspart  0-5 Units Subcutaneous QHS  . insulin aspart  0-9 Units Subcutaneous TID WC  . insulin glargine  8 Units Subcutaneous Daily  . linaclotide  145 mcg Oral QAC breakfast  . multivitamin  1 tablet Oral QHS  . pantoprazole  40 mg Oral Daily  . simvastatin  40 mg Oral Daily  . Warfarin - Pharmacist Dosing Inpatient   Does not apply q1800    Dialysis Orders: MWF NW 4h  104kg  3K/2.25  R thigh TDC / L thigh AVG placed 03/16/17 Hep 4000 - Mircera 54mcg q 2wks - last 10/5 -Hectorol 27mcg IV qHD  Last Labs PTA: 10/12Hgb 9.5, K 5.1, Ca 8.7, P 4.0  Assessment/Plan: 1. DVT R leg 2/2 R fem cath, removed 10/22 - h/o PE with IVC filter placement. Coumadin bridge with  heparin, per pharm/primary.  INR 1.14 CT indicates no current PE. 2. Cough - Improving. Resp panel neg, CXR unremarkable.  3. ESRD - MWF.  R TDC removed by [facility] 10/22. HD 10/22, use of L thigh AVG for 1st time, net UF 3.132mL, post weight 105.6kg.  Hypotension throughout HD, requiring 228mL bolus x2 & removal from UF.  Pain meds given right before HD, need to hold all pain meds, prior to treatment. HD tomorrow, orders written. Add midodrine for BP support during HD 3. Anemia of CKD- Hgb stable 9.0. Follow. Aranesp 179mcg IV qwk (Fri) started 10/19.  4. Secondary hyperparathyroidism - Ca and P in goal. Continue VDRA. 5. HTN/volume - BP variable, mostly soft. Hypotension during HD yesterday, but not the prior treatment. Monitor. If continues may need to consider midodrine with HD. Still above EDW, titrate down volume as tolerated.  Add midodrine as above 6. Nutrition - Alb 2.7. Prostat BID, renavite, Renal/Carb modified diet.  7. HF - EF 55-60%, per primary 8. DMT2 - per primary  Jen Mow, PA-C [facility] Pager: [phone] 04/11/2017,11:08 AM  LOS: 5 days   I have seen and examined this patient and agree with plan and assessment in the above note with renal recommendations/intervention highlighted. AVG worked fine for HD yesterday. Next HD Wednesday. Waiting on therapeutic INR. Will need to work out way for her to have INR followed in her SNF once closer to discharge  [place] B,MD 04/11/2017 12:38 PM

## 2017-04-11 NOTE — Evaluation (Signed)
Occupational Therapy Evaluation Patient Details Name: Ashlee Miles MRN: [id number] DOB: 1941/06/11 Today's Date: 04/11/2017    History of Present Illness Pt is a 76 y/o female admitted from [facility] secondary to R LE pain. Pt found to have R femoral vein DVT and has been on a heparin drip since 10/18. PMH including but not limited to ESRD   Clinical Impression   PTA, pt was participating in rehabilitation at SNF. She currently requires max assist for LB ADL, min assist for UB ADL, and supervision for seated grooming and self-feeding tasks at EOB. Per chart review and pt report she did require assistance with all ADL prior to precious admission and D/C to SNF and is likely close to baseline. She would benefit from continued OT services while admitted to improve independence with ADL and functional mobility. Recommend continued rehabilitation at SNF level post-acute D/C.     Follow Up Recommendations  SNF;Supervision/Assistance - 24 hour    Equipment Recommendations  None recommended by OT    Recommendations for Other Services       Precautions / Restrictions Precautions Precautions: Fall Restrictions Weight Bearing Restrictions: No      Mobility Bed Mobility Overal bed mobility: Needs Assistance Bed Mobility: Supine to Sit;Sit to Supine     Supine to sit: Mod assist        Transfers Overall transfer level: Needs assistance Equipment used: Rolling walker (2 wheeled) Transfers: Sit to/from Stand Sit to Stand: Min assist         General transfer comment: Assist to power up and stabilize. Pt able to take side steps to reposition in bed.     Balance Overall balance assessment: Needs assistance Sitting-balance support: Feet supported;No upper extremity supported Sitting balance-[name]: Fair     Standing balance support: Bilateral upper extremity supported;During functional activity Standing balance-[name]: Poor Standing balance  comment: heavy reliance on RW                           ADL either performed or assessed with clinical judgement   ADL Overall ADL's : Needs assistance/impaired Eating/Feeding: Set up;Sitting   Grooming: Set up;Wash/dry hands;Wash/dry face;Sitting   Upper Body Bathing: Minimal assistance;Sitting   Lower Body Bathing: Maximal assistance;Sitting/lateral leans   Upper Body Dressing : Minimal assistance;Sitting   Lower Body Dressing: Maximal assistance;Sitting/lateral leans     Toilet Transfer Details (indicate cue type and reason): Able to complete sit <> stand with lateral steps toward [facility] in preparation for improved toilet transfers. Limited by fatigue.            General ADL Comments: Pt able to complete oral care and self feeding tasks sitting at EOB with supervision and set-up. Limited by fatigue from HD overnight.      Vision Baseline Vision/History: Wears glasses Wears Glasses:  ("most of the time" help me read) Patient Visual Report: No change from baseline Vision Assessment?: No apparent visual deficits     Perception     Praxis      Pertinent Vitals/Pain Pain Assessment: Faces Faces Pain Scale: Hurts even more Pain Location: sacrum Pain Descriptors / Indicators: Grimacing;Sore;Guarding Pain Intervention(s): Limited activity within patient's tolerance;Monitored during session;Repositioned     Hand Dominance     Extremity/Trunk Assessment Upper Extremity Assessment Upper Extremity Assessment: Generalized weakness   Lower Extremity Assessment Lower Extremity Assessment: Defer to PT evaluation       Communication Communication Communication: No difficulties  Cognition Arousal/Alertness: Awake/alert Behavior During Therapy: WFL for tasks assessed/performed Overall Cognitive Status: Within Functional Limits for tasks assessed                                     General Comments       Exercises     Shoulder Instructions       Home Living Family/patient expects to be discharged to:: Skilled nursing facility (SNF per chart; pt reports going home with daughter) Living Arrangements: Children Available Help at Discharge: Family;Personal care attendant (PCA M-F 3.25 hours) Type of Home: House Home Access: Stairs to enter [employer] of Steps: 3         Bathroom Shower/Tub: Corporate investment banker: Standard (with riser)     Home Equipment: Toilet riser;Walker - 4 wheels;Shower seat   Additional Comments: Was having rehab at [facility]. Pt reports preference to go home with her daughter rather than back to rehab although chart reads as though plan is to return to rehab      Prior Functioning/Environment Level of Independence: Needs assistance  Gait / Transfers Assistance Needed: ambulates with Rollator with PT assistance at rehab ADL's / Homemaking Assistance Needed: needs assistance from SNF staff            OT Problem List: Decreased strength;Decreased activity tolerance;Impaired UE functional use;Pain      OT Treatment/Interventions: Self-care/ADL training;Therapeutic exercise;Energy conservation;DME and/or AE instruction;Therapeutic activities;Patient/family education;Balance training    OT Goals(Current goals can be found in the care plan section) Acute Rehab OT Goals Patient Stated Goal: get some rest OT Goal Formulation: With patient Time For Goal Achievement: 04/25/17 Potential to Achieve Goals: Good ADL Goals Pt Will Perform Grooming: with modified independence;sitting Pt Will Perform Upper Body Bathing: with modified independence;sitting Pt Will Transfer to Toilet: with min guard assist;stand pivot transfer;bedside commode Pt Will Perform Toileting - Clothing Manipulation and hygiene: with min assist;sit to/from stand Pt/caregiver will Perform Home Exercise Program: Increased strength;Both right and left upper extremity;With Supervision;With  written HEP provided  OT Frequency: Min 1X/week   Barriers to D/C:            Co-evaluation              AM-PAC PT "6 Clicks" Daily Activity     Outcome Measure Help from another person eating meals?: A Little Help from another person taking care of personal grooming?: A Little Help from another person toileting, which includes using toliet, bedpan, or urinal?: A Lot Help from another person bathing (including washing, rinsing, drying)?: A Lot Help from another person to put on and taking off regular upper body clothing?: A Little Help from another person to put on and taking off regular lower body clothing?: A Lot 6 Click Score: 15   End of Session Equipment Utilized During Treatment: Rolling walker Nurse Communication: Mobility status  Activity Tolerance: Patient tolerated treatment well Patient left: in bed;with call bell/phone within reach;with bed alarm set  OT Visit Diagnosis: Unsteadiness on feet (R26.81);Muscle weakness (generalized) (M62.81);Pain Pain - Right/Left: Left Pain - part of body: Leg                Time: 2297-9892 OT Time Calculation (min): 29 min Charges:  OT General Charges $OT Visit: 1 Visit OT Evaluation $OT Eval Moderate Complexity: 1 Mod OT Treatments $Self Care/Home Management : 8-22 mins G-Codes:     [employer]  Eusebio Me, MS OTR/L  Pager: [place] A Chaselynn Kepple 04/11/2017, 10:02 AM

## 2017-04-11 NOTE — Progress Notes (Signed)
PT Cancellation Note  Patient Details Name: Ashlee Miles MRN: [id number] DOB: Jun 24, 1940   Cancelled Treatment:    Reason Eval/Treat Not Completed: Fatigue/lethargy limiting ability to participate.  Pt is reporting she was in HD overnight and declined to get up today.  Also declined to do ROM to legs, and asked PT to try again tomorrow.   Ramond Dial 04/11/2017, 12:08 PM   Mee Hives, PT MS Acute Rehab Dept. Number: [place]

## 2017-04-11 NOTE — Progress Notes (Signed)
[place] for Heparin and Warfarin  Indication: DVT RLE  No Known Allergies  Patient Measurements: Height: 5\' 1"  (154.9 cm) Weight: 232 lb 12.9 oz (105.6 kg) IBW/kg (Calculated) : 47.8 Heparin Dosing Weight: 73.3  Vital Signs Temp: 97.7 F (36.5 C) (10/23 1007) Temp Source: Oral (10/23 1007) BP: 99/36 (10/23 1007) Pulse Rate: 72 (10/23 1007)  Labs:  Recent Labs  04/09/17 0456 04/10/17 0429 04/10/17 1937 04/10/17 2337 04/11/17 1051  HGB 8.8* 9.0*  --  9.0* 9.0*  HCT 27.5* 28.0*  --  28.3* 28.3*  PLT 129* 147*  --  218 PENDING  LABPROT 13.8 14.6  --   --  19.3*  INR 1.06 1.14  --   --  1.65  HEPARINUNFRC 0.35 0.34 0.39  --   --   CREATININE  --   --   --  5.04*  --     Estimated Creatinine Clearance: 10.8 mL/min (A) (by C-G formula based on SCr of 5.04 mg/dL (H)).   Medical History: Past Medical History:  Diagnosis Date  . CHF (congestive heart failure) ([place])   . Diabetes mellitus without complication ([place])   . ESRD (end stage renal disease) on dialysis ([place])   . History of pituitary adenoma   . Hypertension   . Protein C deficiency ([place])        Assessment: 15 YOF admitted from imaging facility with DVT in right thigh. Hx of PE with IVC filter - no current PE. Not on anticoagulation PTA. ESRD MWF. Day #5/5 of overlap with new DVT.  Heparin level remains therapeutic INR trending up today, 1.14 to 1.65 after doses increased to 7.5mg  last 2 days Hg low stable, plt improved to wnl. No bleeding noted  Goal of Therapy:  Heparin level 0.3-0.7 units/ml Monitor platelets by anticoagulation protocol: Yes  INR 2 to 3   Plan:  Continue heparin at 1050 units / hr Warfarin 7.5 mg po x 1 more dose - then likely decrease tomorrow Daily heparin level/CBC/INR Monitor for s/sx bleeding Decrease asa to 81 mg?   Elicia Lamp, PharmD, BCPS Clinical Pharmacist 04/11/2017 11:54 AM

## 2017-04-12 DIAGNOSIS — R0602 Shortness of breath: Secondary | ICD-10-CM

## 2017-04-12 DIAGNOSIS — I824Y1 Acute embolism and thrombosis of unspecified deep veins of right proximal lower extremity: Secondary | ICD-10-CM

## 2017-04-12 DIAGNOSIS — I82409 Acute embolism and thrombosis of unspecified deep veins of unspecified lower extremity: Secondary | ICD-10-CM

## 2017-04-12 LAB — COMPREHENSIVE METABOLIC PANEL
ALT: 14 U/L (ref 14–54)
AST: 27 U/L (ref 15–41)
Albumin: 2.3 g/dL — ABNORMAL LOW (ref 3.5–5.0)
Alkaline Phosphatase: 55 U/L (ref 38–126)
Anion gap: 7 (ref 5–15)
BUN: 26 mg/dL — ABNORMAL HIGH (ref 6–20)
CO2: 25 mmol/L (ref 22–32)
Calcium: 7.3 mg/dL — ABNORMAL LOW (ref 8.9–10.3)
Chloride: 104 mmol/L (ref 101–111)
Creatinine, Ser: 3.79 mg/dL — ABNORMAL HIGH (ref 0.44–1.00)
GFR calc Af Amer: 12 mL/min — ABNORMAL LOW (ref >60)
GFR calc non Af Amer: 11 mL/min — ABNORMAL LOW (ref >60)
Glucose, Bld: 158 mg/dL — ABNORMAL HIGH (ref 65–99)
Potassium: 3.4 mmol/L — ABNORMAL LOW (ref 3.5–5.1)
Sodium: 136 mmol/L (ref 135–145)
Total Bilirubin: 0.4 mg/dL (ref 0.3–1.2)
Total Protein: 5.1 g/dL — ABNORMAL LOW (ref 6.5–8.1)

## 2017-04-12 LAB — PROTIME-INR
INR: 2.15
Prothrombin Time: 23.9 seconds — ABNORMAL HIGH (ref 11.4–15.2)

## 2017-04-12 LAB — MAGNESIUM: Magnesium: 1.6 mg/dL — ABNORMAL LOW (ref 1.7–2.4)

## 2017-04-12 LAB — CBC WITH DIFFERENTIAL/PLATELET
Basophils Absolute: 0 10*3/uL (ref 0.0–0.1)
Basophils Relative: 1 %
Eosinophils Absolute: 0.7 10*3/uL (ref 0.0–0.7)
Eosinophils Relative: 11 %
HCT: 25.1 % — ABNORMAL LOW (ref 36.0–46.0)
Hemoglobin: 8.1 g/dL — ABNORMAL LOW (ref 12.0–15.0)
Lymphocytes Relative: 40 %
Lymphs Abs: 2.5 10*3/uL (ref 0.7–4.0)
MCH: 25.7 pg — ABNORMAL LOW (ref 26.0–34.0)
MCHC: 32.3 g/dL (ref 30.0–36.0)
MCV: 79.7 fL (ref 78.0–100.0)
Monocytes Absolute: 0.5 10*3/uL (ref 0.1–1.0)
Monocytes Relative: 9 %
Neutro Abs: 2.4 10*3/uL (ref 1.7–7.7)
Neutrophils Relative %: 39 %
Platelets: 140 10*3/uL — ABNORMAL LOW (ref 150–400)
RBC: 3.15 MIL/uL — ABNORMAL LOW (ref 3.87–5.11)
RDW: 16.9 % — ABNORMAL HIGH (ref 11.5–15.5)
WBC: 6.1 10*3/uL (ref 4.0–10.5)

## 2017-04-12 LAB — HEPARIN LEVEL (UNFRACTIONATED): Heparin Unfractionated: 1.34 IU/mL — ABNORMAL HIGH (ref 0.30–0.70)

## 2017-04-12 LAB — GLUCOSE, CAPILLARY
Glucose-Capillary: 211 mg/dL — ABNORMAL HIGH (ref 65–99)
Glucose-Capillary: 190 mg/dL — ABNORMAL HIGH (ref 65–99)

## 2017-04-12 LAB — PHOSPHORUS: Phosphorus: 3.3 mg/dL (ref 2.5–4.6)

## 2017-04-12 MED ORDER — HEPARIN SODIUM (PORCINE) 1000 UNIT/ML DIALYSIS: 1000.0000 [IU] | INTRAMUSCULAR | Status: DC | PRN

## 2017-04-12 MED ORDER — WARFARIN SODIUM 5 MG PO TABS
5.0000 mg | ORAL_TABLET | Freq: Once | ORAL | Status: AC
  Administered 2017-04-12: 5 mg via ORAL
  Filled 2017-04-12 (×2): qty 1

## 2017-04-12 MED ORDER — RENA-VITE PO TABS: 1.0000 | ORAL_TABLET | Freq: Every day | ORAL | 0 refills | Status: AC

## 2017-04-12 MED ORDER — DOCUSATE SODIUM 100 MG PO CAPS: 100.0000 mg | ORAL_CAPSULE | Freq: Two times a day (BID) | ORAL | 0 refills | Status: AC

## 2017-04-12 MED ORDER — PRO-STAT SUGAR FREE PO LIQD: 30.0000 mL | Freq: Two times a day (BID) | ORAL | 0 refills | Status: DC

## 2017-04-12 MED ORDER — WARFARIN SODIUM 5 MG PO TABS: 5.0000 mg | ORAL_TABLET | Freq: Every day | ORAL | 0 refills | Status: DC

## 2017-04-12 MED ORDER — PENTAFLUOROPROP-TETRAFLUOROETH EX AERO: 1.0000 "application " | INHALATION_SPRAY | CUTANEOUS | Status: DC | PRN

## 2017-04-12 MED ORDER — IPRATROPIUM-ALBUTEROL 0.5-2.5 (3) MG/3ML IN SOLN: 3.0000 mL | RESPIRATORY_TRACT | 0 refills | Status: DC | PRN

## 2017-04-12 MED ORDER — HEPARIN SOD (PORK) LOCK FLUSH 100 UNIT/ML IV SOLN
500.0000 [IU] | INTRAVENOUS | Status: AC | PRN
  Administered 2017-04-12: 500 [IU]

## 2017-04-12 MED ORDER — ASPIRIN EC 81 MG PO TBEC: 81.0000 mg | DELAYED_RELEASE_TABLET | Freq: Every day | ORAL | Status: DC

## 2017-04-12 MED ORDER — ASPIRIN 81 MG PO TBEC: 81.0000 mg | DELAYED_RELEASE_TABLET | Freq: Every day | ORAL | 0 refills | Status: DC

## 2017-04-12 MED ORDER — HEPARIN SODIUM (PORCINE) 1000 UNIT/ML DIALYSIS: 20.0000 [IU]/kg | INTRAMUSCULAR | Status: DC | PRN

## 2017-04-12 MED ORDER — NEPRO/CARBSTEADY PO LIQD: 237.0000 mL | Freq: Three times a day (TID) | ORAL | 0 refills | Status: DC | PRN

## 2017-04-12 MED ORDER — DOXERCALCIFEROL 4 MCG/2ML IV SOLN
INTRAVENOUS | Status: AC
  Administered 2017-04-12: 2 ug via INTRAVENOUS
  Filled 2017-04-12: qty 2

## 2017-04-12 MED ORDER — SODIUM CHLORIDE 0.9 % IV SOLN: 100.0000 mL | INTRAVENOUS | Status: DC | PRN

## 2017-04-12 MED ORDER — MIDODRINE HCL 5 MG PO TABS
ORAL_TABLET | ORAL | Status: AC
  Administered 2017-04-12: 10 mg via ORAL
  Filled 2017-04-12: qty 2

## 2017-04-12 MED ORDER — ACETAMINOPHEN 325 MG PO TABS
ORAL_TABLET | ORAL | Status: AC
  Filled 2017-04-12: qty 2

## 2017-04-12 MED ORDER — LIDOCAINE-PRILOCAINE 2.5-2.5 % EX CREA: 1.0000 "application " | TOPICAL_CREAM | CUTANEOUS | Status: DC | PRN

## 2017-04-12 MED ORDER — GUAIFENESIN-DM 100-10 MG/5ML PO SYRP: 5.0000 mL | ORAL_SOLUTION | ORAL | Status: DC | PRN

## 2017-04-12 MED ORDER — CAMPHOR-MENTHOL 0.5-0.5 % EX LOTN: 1.0000 "application " | TOPICAL_LOTION | Freq: Three times a day (TID) | CUTANEOUS | 0 refills | Status: DC | PRN

## 2017-04-12 MED ORDER — HYDROXYZINE HCL 25 MG PO TABS: 25.0000 mg | ORAL_TABLET | Freq: Three times a day (TID) | ORAL | 0 refills | Status: DC | PRN

## 2017-04-12 MED ORDER — MIDODRINE HCL 10 MG PO TABS: 10.0000 mg | ORAL_TABLET | ORAL | 0 refills | Status: AC

## 2017-04-12 MED ORDER — LIDOCAINE HCL (PF) 1 % IJ SOLN: 5.0000 mL | INTRAMUSCULAR | Status: DC | PRN

## 2017-04-12 NOTE — Care Management Note (Signed)
Case Management Note  Patient Details  Name: Cherish Runde MRN: [id number] Date of Birth: 1940-11-16  Subjective/Objective:                 Spoke w daughter listed below who is able to accept patient back home instead of her going to SNF. Requires HH and DME. Spoke w Dr Alfredia Ferguson who place orders. They would like to use [facility], referral placed to [facility]. DME to be delivered to room prior to DC. Patient has 2 apts at [facility] patient care center one on Monday for INR check and one later in Nov to est PCP. Daughter updated w apts. She will be here to get patient around 6:00pmBelenda Cruise Daughter [phone]  [phone]      Action/Plan:   Expected Discharge Date:                  Expected Discharge Plan:  [place]  In-House Referral:     Discharge planning Services  CM Consult, Medication Assistance  Post Acute Care Choice:  Durable Medical Equipment, Home Health Choice offered to:  Adult Children  DME Arranged:  3-N-1, Walker rolling DME Agency:  [place] Arranged:  RN, PT, OT, Nurse's Aide, Social Work [employer] Agency:  [place]  Status of Service:  Completed, signed off  If discussed at [employer], dates discussed:    Additional Comments:  Carles Collet, RN 04/12/2017, 3:50 PM

## 2017-04-12 NOTE — Discharge Summary (Signed)
Physician Discharge Summary  Ashlee Miles OZY:[id number] DOB: [birth year] DOA: 04/05/2017  PCP: Darlina Rumpf, MD  Admit date: 04/05/2017 Discharge date: 04/12/2017  Admitted From: SNF Disposition:  Home with [place] PT/OT/RN/Aide SW as patienat refused going back to SNF  Recommendations for Outpatient Follow-up:  1. Follow up with PCP in 1-2 weeks; Appointment Made 2. Have INR Rechecked with 3 days to have Coumadin Adjusted 3. Please obtain CMP/CBC, Mag, Phos in one week 4. Have CXR repeated in 4 weeks 5. Please follow up on the following pending results:  Home Health: Yes Equipment/Devices: RW and 3 in 1    Discharge Condition: Stable  CODE STATUS: FULL CODE  Diet recommendation: Renal Carb Modified Heart Healthy Diet  Brief/Interim Summary: Ashlee Miles is a 76 y.o. femalewith medical history significant of ESRD (newly diagnosed and started on HD), DM2, HTN, chronic back pain, anemia, and recent TIA presenting with R leg swelling/pain. She was at dialysis on the day of admission and they called her daughter. The nurse said that her leg was swollen and painful. US showed DVT in the right femoral vein where her temporary dialysis catheter is located. They sent her via ambulance to [facility]. She has an AV graft that is maturing in her right leg. She has temporary venous access in the right femoral vein. She is on MWF HD. Her leg started bothering her ever since the procedure. It has not changed or gotten more red or swollen. She has had cough since last week. She was started on Mucinex with improvement for just a couple of hours. Prior rhinorrhea but none recently. No reflux noted. She has an IVC filter in place.  She moved here after [facility]. Came on a Tuesday and has been sick since. Admitted initially from 9/12-15for TIA/syncope. Discharged to her daughter's house and admitted again from 9/23-10/3 for acute metabolic encephalopathy andprogressive renal  failure. Shestarted HD and was discharged to [facility] and has been there for 2-3 weeks. She was found to have an acute DVT this admission and placed on Coumadin because of her ESRD and was therapeutic today. Patient was going to be discharged back to SNF but wanted to go home with Home Health so she will be D/C'd today. She will need to establish and follow up with a PCP and have INR repeated within then next few days so Coumadin can be adjusted.   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   -She has recurrent DVT which is provoked secondary to temporary hemodialysis catheter.  Symptoms have improved. Still with some pain -Continued Coumadin and bridge with heparin; Coumadin Level now therapeutic -Will D/C on po Coumadin and will need INR Check -Patient appointment made by CSW  Cough -Recurrent.  Chest x-ray unremarkable.  Productive. Chest x-ray unremarkable. RVP negative -Tessalon perles and Mucinex -Repeat CXR as an outpatient   ESRD on hemodialysis -She receives hemodialysis on Monday, Wednesday, Friday. Thigh catheter removed 10/23 by vascular surgery. Hemodialysis through left thigh AVG -Nephrology recommendations -C/w Current Dialysis Schedule   Essential hypertension -Continue Coreg  Diabetes mellitus -Continue Home Medications   Anemia -Normocytic and chronic.  Likely secondary to anemia of chronic disease.  Previous iron studies in September 2018 suggest anemia of chronic disease.  Hemoglobin currently stable at 8.1  Thrombocytopenia -Labile.  No evidence of bleeding. -Continue to trend as an outpatient  Hypokalemia -likely to be  corrected in Dialysis   Hx of TIA  -ASA 325 decreased to 81 mg now that patient is on Coumadin  Discharge Instructions  Discharge Instructions    Call MD for:  difficulty breathing, headache or visual disturbances    Complete by:  As directed    Call MD for:  extreme fatigue    Complete by:  As directed    Call MD for:  hives    Complete by:  As directed    Call MD for:  persistant dizziness or light-headedness    Complete by:  As directed    Call MD for:  persistant nausea and vomiting    Complete by:  As directed    Call MD for:  redness, tenderness, or signs of infection (pain, swelling, redness, odor or green/yellow discharge around incision site)    Complete by:  As directed    Call MD for:  severe uncontrolled pain    Complete by:  As directed    Call MD for:  temperature >100.4    Complete by:  As directed    Diet - low sodium heart healthy    Complete by:  As directed    Renal Carb Modified   Discharge instructions    Complete by:  As directed    Follow up with PCP and Nephrology as an outpatient. Take all medications as prescribed and have PCP repeat INR. If symptoms change or worsen please return to the ED for evaluation.   Increase activity slowly    Complete by:  As directed      Allergies as of 04/12/2017   No Known Allergies     Medication List    STOP taking these medications   aspirin 325 MG tablet Replaced by:  aspirin 81 MG EC tablet     TAKE these medications   acetaminophen 325 MG tablet Commonly known as:  TYLENOL Take 650 mg by mouth every 6 (six) hours as needed for mild pain.   aspirin 81 MG EC tablet Take 1 tablet (81 mg total) by mouth daily. Replaces:  aspirin 325 MG tablet   calcitRIOL 0.5 MCG capsule Commonly known as:  ROCALTROL Take 0.5 mcg by mouth daily.   camphor-menthol lotion Commonly known as:  SARNA Apply 1 application topically every 8 (eight) hours as needed for itching.   carvedilol 6.25 MG tablet Commonly known  as:  COREG Take 1 tablet (6.25 mg total) by mouth 2 (two) times daily.   cyclobenzaprine 10 MG tablet Commonly known as:  FLEXERIL Take 10 mg by mouth as needed for muscle spasms.   docusate sodium 100 MG capsule Commonly known as:  COLACE Take 1 capsule (100 mg total) by mouth 2 (two) times daily.   feeding supplement (NEPRO CARB STEADY) Liqd Take 237 mLs by mouth 3 (three) times daily as needed (Supplement).   feeding supplement (PRO-STAT SUGAR FREE 64) Liqd Take 30 mLs by mouth 2 (two) times daily.   gabapentin 300 MG capsule Commonly known as:  NEURONTIN Take 300 mg by mouth 3 (three) times daily.   hydrOXYzine 25 MG tablet Commonly known as:  ATARAX/VISTARIL Take 1 tablet (25 mg total) by mouth every 8 (eight) hours as needed for itching.  insulin aspart 100 UNIT/ML injection Commonly known as:  novoLOG Inject 0-9 Units into the skin 3 (three) times daily with meals. What changed:  how much to take   insulin glargine 100 UNIT/ML injection Commonly known as:  LANTUS Inject 0.06 mLs (6 Units total) into the skin daily.   ipratropium-albuterol 0.5-2.5 (3) MG/3ML Soln Commonly known as:  DUONEB Take 3 mLs by nebulization every 4 (four) hours as needed.   linaclotide 145 MCG Caps capsule Commonly known as:  LINZESS Take 145 mcg by mouth daily before breakfast.   midodrine 10 MG tablet Commonly known as:  PROAMATINE Take 1 tablet (10 mg total) by mouth every Monday, Wednesday, and Friday with hemodialysis.   MUCINEX 600 MG 12 hr tablet Generic drug:  guaiFENesin Take 600 mg by mouth 2 (two) times daily.   multivitamin Tabs tablet Take 1 tablet by mouth at bedtime.   omeprazole 20 MG capsule Commonly known as:  PRILOSEC Take 20 mg by mouth daily.   OXYCODONE HCL PO Take 5 mg by mouth daily as needed. pain   ranitidine 150 MG tablet Commonly known as:  ZANTAC Take 150 mg by mouth 2 (two) times daily.   simvastatin 40 MG tablet Commonly known as:   ZOCOR Take 1 tablet (40 mg total) by mouth daily.   triamcinolone cream 0.1 % Commonly known as:  KENALOG Apply 1 application topically 2 (two) times daily. Use on her hand   warfarin 5 MG tablet Commonly known as:  COUMADIN Take 1 tablet (5 mg total) by mouth daily at 6 PM.            Durable Medical Equipment        Start     Ordered   04/12/17 1547  For home use only DME Walker rolling  Once    Question:  Patient needs a walker to treat with the following condition  Answer:  Weakness   04/12/17 1546   04/12/17 1547  For home use only DME 3 n 1  Once     04/12/17 [place] Follow up.   Why:  Rw and 3/1 to be delivered to room prior to DC [employer] information: [address] [place] [address] [phone]        [facility] Follow up.   Why:  Home health PT OT SW RN HHA to begin in next 1-2 days Contact information: [address] [place] [address] [phone]        [facility]. Go on 04/17/2017.   Specialty:  Internal Medicine Why:  at [place] INR FOR YOUR COUMADIN DOSE. DO NOT MISS THIS APPOINTMENT. You also have an appointment on Tuesday November 13 at [place] [place] to see a doctor.  Contact information: [place] [phone]         No Known Allergies  Consultations: Nephrology Vascular Surgery  Procedures/Studies: Dg Chest 2 View  Result Date: 04/09/2017 CLINICAL DATA:  Productive cough since last week EXAM: CHEST  2 VIEW COMPARISON:  CT chest 04/05/2017 FINDINGS: Right-sided Port-A-Cath with the tip projecting over the SVC. There is no focal parenchymal opacity. There is no pleural effusion or pneumothorax. The heart and mediastinal contours are unremarkable. The osseous structures are unremarkable. IMPRESSION: No active cardiopulmonary disease. Electronically Signed    By: Kathreen Devoid  On: 04/09/2017 14:51   Dg Chest 2 View  Result Date: 04/05/2017 CLINICAL DATA:  Cough and shortness of Breath or several days EXAM: CHEST  2 VIEW COMPARISON:  03/12/2017 FINDINGS: Right chest wall port is again identified and stable. Cardiac shadow is enlarged but stable. Postsurgical changes in the cervical spine are seen. The overall inspiratory effort is poor although no focal infiltrate or sizable effusion is seen. Vascular stenting is again seen in the left arm. IMPRESSION: Poor inspiratory effort without acute abnormality. Electronically Signed   By: Inez Catalina M.D.   On: 04/05/2017 19:59   Ct Angio Chest Pe W And/or Wo Contrast  Result Date: 04/05/2017 CLINICAL DATA:  Recently diagnosed with deep venous thrombosis in the right thigh. Now complains of chest pain and shortness of breath for 3 days. Positive D-dimer history of previous pulmonary embolus, hypertension, diabetes. EXAM: CT ANGIOGRAPHY CHEST WITH CONTRAST TECHNIQUE: Multidetector CT imaging of the chest was performed using the standard protocol during bolus administration of intravenous contrast. Multiplanar CT image reconstructions and MIPs were obtained to evaluate the vascular anatomy. CONTRAST:  50ccs isovue 370 given. Pt on dialysis <91month, contrast approved by Dr. JJeneen Rinksand Dr. PPosey ProntoCOMPARISON:  None. FINDINGS: Cardiovascular: Moderately good opacification of the central and segmental pulmonary arteries. No filling defects identified. No evidence of significant pulmonary embolus. Mild cardiac enlargement. No pericardial effusion. Coronary artery calcifications. Normal caliber thoracic aorta. No aortic dissection. Great vessel origins are patent. Scattered calcifications in the aorta. Mediastinum/Nodes: Esophagus is decompressed. No significant lymphadenopathy in the chest. Thyroid gland is unremarkable. Lungs/Pleura: Motion artifact limits evaluation of the lungs. No focal consolidation is identified. Probable  atelectasis in the lung bases. No pleural effusions. No pneumothorax. Airways appear patent. Upper Abdomen: Surgical absence of the gallbladder. No bile duct dilatation. Kidneys are partially visualized but appear diffusely atrophic. There is a catheter in the inferior vena cava with tip in the intrahepatic vena cava. Musculoskeletal: Degenerative changes in the thoracic spine. Normal alignment. Diffusely heterogeneous appearance of bone matrix with mixed sclerosis and lucent changes throughout. 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 No vertebral compression fractures. Review of the MIP images confirms the above findings. IMPRESSION: 1. No evidence of significant pulmonary embolus. 2. No evidence of active pulmonary disease. Atelectasis in the lung bases. 3. Aortic atherosclerosis. 4. 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 5. Central venous catheter from inferior approach with tip in the intrahepatic inferior vena cava. Aortic Atherosclerosis (ICD10-I70.0). Electronically Signed   By: WLucienne CapersM.D.   On: 04/05/2017 21:48   Dg Abd Portable 1v  Result Date: 03/16/2017 CLINICAL DATA:  Post femoral catheter placement, diabetes mellitus, hypertension, CHF EXAM: PORTABLE ABDOMEN - 1 VIEW COMPARISON:  Portable exam 1007 hours without priors for comparison FINDINGS: RIGHT femoral line with tip projecting over expected position of the RIGHT common iliac vein. IVC filter noted. Nonobstructive bowel gas pattern with prominent stool throughout colon. Surgical clips RIGHT upper quadrant question cholecystectomy. Bones demineralized. IMPRESSION: Tip of RIGHT femoral line projects over expected position of RIGHT common iliac vein. Increased stool in colon. Electronically Signed   By: MLavonia DanaM.D.   On: 03/16/2017 10:24   Dg Fluoro Guide  Cv Line-no Report  Result Date: 03/16/2017 Fluoroscopy was utilized by the requesting physician.  No radiographic interpretation.    Subjective: Seen and examined in  dialysis and stated she had some leg pain. States her cough is also chronic. No CP or SOB. Did not want to go back to SNF so going to go home with Home Health.  Discharge Exam: Vitals:   04/12/17 1420 04/12/17 1701  BP: 130/61 135/65  Pulse: 60 70  Resp: 16 17  Temp: (!) 97.4 F (36.3 C) 98 F (36.7 C)  SpO2: 99% 99%   Vitals:   04/12/17 1330 04/12/17 1400 04/12/17 1420 04/12/17 1701  BP: 120/62 (!) 133/111 130/61 135/65  Pulse: (!) 59 60 60 70  Resp:   16 17  Temp:   (!) 97.4 F (36.3 C) 98 F (36.7 C)  TempSrc:   Oral Oral  SpO2:   99% 99%  Weight:   102.5 kg (225 lb 15.5 oz)   Height:       General: Pt is alert, awake, not in acute distress Cardiovascular: RRR, S1/S2 +, no rubs, no gallops Respiratory: CTA bilaterally, no wheezing, no rhonchi; +Non-productive Cough Abdominal: Soft, NT, ND, bowel sounds + Extremities: Mild LE edema, no cyanosis  The results of significant diagnostics from this hospitalization (including imaging, microbiology, ancillary and laboratory) are listed below for reference.    Microbiology: Recent Results (from the past 240 hour(s))  Respiratory Panel by PCR     Status: None   Collection Time: 04/09/17 10:29 PM  Result Value Ref Range Status   Adenovirus NOT DETECTED NOT DETECTED Final   Coronavirus 229E NOT DETECTED NOT DETECTED Final   Coronavirus HKU1 NOT DETECTED NOT DETECTED Final   Coronavirus NL63 NOT DETECTED NOT DETECTED Final   Coronavirus OC43 NOT DETECTED NOT DETECTED Final   Metapneumovirus NOT DETECTED NOT DETECTED Final   Rhinovirus / Enterovirus NOT DETECTED NOT DETECTED Final   Influenza A NOT DETECTED NOT DETECTED Final   Influenza B NOT DETECTED NOT DETECTED Final   Parainfluenza Virus 1 NOT DETECTED NOT DETECTED Final   Parainfluenza Virus 2 NOT  DETECTED NOT DETECTED Final   Parainfluenza Virus 3 NOT DETECTED NOT DETECTED Final   Parainfluenza Virus 4 NOT DETECTED NOT DETECTED Final   Respiratory Syncytial Virus NOT DETECTED NOT DETECTED Final   Bordetella pertussis NOT DETECTED NOT DETECTED Final   Chlamydophila pneumoniae NOT DETECTED NOT DETECTED Final   Mycoplasma pneumoniae NOT DETECTED NOT DETECTED Final    Labs: BNP (last 3 results)  Recent Labs  04/05/17 1947  BNP 258.5*   Basic Metabolic Panel:  Recent Labs Lab 04/06/17 0524 04/07/17 1457 04/10/17 2337 04/12/17 1012  NA 135 134* 137 136  K 4.5 5.1 4.1 3.4*  CL 98* 101 102 104  CO2 _0 GLUCOSE 174* 223* 215* 158*  BUN 15 36* 39* 26*  CREATININE 2.93* 4.67* 5.04* 3.79*  CALCIUM 8.1* 8.1* 8.5* 7.3*  MG  --   --   --  1.6*  PHOS  --  4.3 3.1 3.3   Liver Function Tests:  Recent Labs Lab 04/07/17 1457 04/10/17 2337 04/12/17 1012  AST  --   --  27  ALT  --   --  14  ALKPHOS  --   --  55  BILITOT  --   --  0.4  PROT  --   --  5.1*  ALBUMIN 2.6* 2.7* 2.3*   No results for input(s): LIPASE, AMYLASE in the last 168 hours. No results for input(s): AMMONIA in the last 168 hours. CBC:  Recent Labs Lab 04/09/17 0456 04/10/17 0429 04/10/17  2337 04/11/17 1051 04/12/17 1012  WBC 6.4 6.4 7.2 7.5 6.1  NEUTROABS  --   --   --   --  2.4  HGB 8.8* 9.0* 9.0* 9.0* 8.1*  HCT 27.5* 28.0* 28.3* 28.3* 25.1*  MCV 79.3 79.1 79.5 80.2 79.7  PLT 129* 147* 218 152 140*   Cardiac Enzymes: No results for input(s): CKTOTAL, CKMB, CKMBINDEX, TROPONINI in the last 168 hours. BNP: Invalid input(s): POCBNP CBG:  Recent Labs Lab 04/11/17 1151 04/11/17 1707 04/11/17 2151 04/12/17 0756 04/12/17 1656  GLUCAP 246* 211* 251* 211* 190*   D-Dimer No results for input(s): DDIMER in the last 72 hours. Hgb A1c No results for input(s): HGBA1C in the last 72 hours. Lipid Profile No results for input(s): CHOL, HDL, LDLCALC, TRIG, CHOLHDL, LDLDIRECT in the  last 72 hours. Thyroid function studies No results for input(s): TSH, T4TOTAL, T3FREE, THYROIDAB in the last 72 hours.  Invalid input(s): FREET3 Anemia work up No results for input(s): VITAMINB12, FOLATE, FERRITIN, TIBC, IRON, RETICCTPCT in the last 72 hours. Urinalysis    Component Value Date/Time   COLORURINE STRAW (A) 03/13/2017 1101   APPEARANCEUR CLEAR 03/13/2017 1101   LABSPEC 1.010 03/13/2017 1101   PHURINE 6.0 03/13/2017 1101   GLUCOSEU NEGATIVE 03/13/2017 1101   HGBUR NEGATIVE 03/13/2017 1101   BILIRUBINUR NEGATIVE 03/13/2017 1101   [place] 03/13/2017 1101   PROTEINUR 100 (A) 03/13/2017 1101   NITRITE NEGATIVE 03/13/2017 1101   LEUKOCYTESUR MODERATE (A) 03/13/2017 1101   Sepsis Labs Invalid input(s): PROCALCITONIN,  WBC,  LACTICIDVEN Microbiology Recent Results (from the past 240 hour(s))  Respiratory Panel by PCR     Status: None   Collection Time: 04/09/17 10:29 PM  Result Value Ref Range Status   Adenovirus NOT DETECTED NOT DETECTED Final   Coronavirus 229E NOT DETECTED NOT DETECTED Final   Coronavirus HKU1 NOT DETECTED NOT DETECTED Final   Coronavirus NL63 NOT DETECTED NOT DETECTED Final   Coronavirus OC43 NOT DETECTED NOT DETECTED Final   Metapneumovirus NOT DETECTED NOT DETECTED Final   Rhinovirus / Enterovirus NOT DETECTED NOT DETECTED Final   Influenza A NOT DETECTED NOT DETECTED Final   Influenza B NOT DETECTED NOT DETECTED Final   Parainfluenza Virus 1 NOT DETECTED NOT DETECTED Final   Parainfluenza Virus 2 NOT DETECTED NOT DETECTED Final   Parainfluenza Virus 3 NOT DETECTED NOT DETECTED Final   Parainfluenza Virus 4 NOT DETECTED NOT DETECTED Final   Respiratory Syncytial Virus NOT DETECTED NOT DETECTED Final   Bordetella pertussis NOT DETECTED NOT DETECTED Final   Chlamydophila pneumoniae NOT DETECTED NOT DETECTED Final   Mycoplasma pneumoniae NOT DETECTED NOT DETECTED Final   Time coordinating discharge: 35 minutes  SIGNED:  Kerney Elbe, DO [facility] 04/12/2017, 8:45 PM Pager [phone]  If 7PM-7AM, please contact night-coverage [email or web address] Password [email or web address]

## 2017-04-12 NOTE — Progress Notes (Signed)
PT Cancellation Note  Patient Details Name: Ashlee Miles MRN: [id number] DOB: 1940/07/22   Cancelled Treatment:    Reason Eval/Treat Not Completed: Patient at procedure or test/unavailable   Currently transporting to HD;   Will follow up later today as time allows;  Otherwise, will follow up for PT tomorrow;   Thank you,  Roney Marion, PT  Acute Rehabilitation Services Pager [phone] Office [phone]     Colletta [place] 04/12/2017, 10:25 AM

## 2017-04-12 NOTE — Progress Notes (Signed)
ANTICOAGULATION CONSULT NOTE  Pharmacy Consult for Heparin and Warfarin  Indication: DVT RLE  No Known Allergies  Patient Measurements: Height: 5\' 1"  (154.9 cm) Weight: 236 lb 5.3 oz (107.2 kg) IBW/kg (Calculated) : 47.8 Heparin Dosing Weight: 73.3  Vital Signs Temp: 98.1 F (36.7 C) (10/24 1045) Temp Source: Oral (10/24 1045) BP: 98/45 (10/24 1130) Pulse Rate: 62 (10/24 1130)  Labs:  Recent Labs  04/10/17 0429 04/10/17 1937 04/10/17 2337 04/11/17 1051 04/12/17 0939 04/12/17 0953 04/12/17 1012  HGB 9.0*  --  9.0* 9.0*  --   --  8.1*  HCT 28.0*  --  28.3* 28.3*  --   --  25.1*  PLT 147*  --  218 152  --   --  140*  LABPROT 14.6  --   --  19.3* 23.9*  --   --   INR 1.14  --   --  1.65 2.15  --   --   HEPARINUNFRC 0.34 0.39  --  0.46  --  1.34*  --   CREATININE  --   --  5.04*  --   --   --  3.79*    Estimated Creatinine Clearance: 14.5 mL/min (A) (by C-G formula based on SCr of 3.79 mg/dL (H)).   Assessment: 18 YOF admitted from imaging facility with DVT in right thigh. Hx of PE with IVC filter - no current PE. Not on anticoagulation PTA. ESRD MWF. S/p 5 days overlap for new DVT.    The patient's INR this morning is therapeutic (INR 2.15 << 1.65, goal of 2-3). CBC stable - no bleeding noted. Discussed with MD - will d/c Heparin bridge today.  Goal of Therapy:  Heparin level 0.3-0.7 units/ml Monitor platelets by anticoagulation protocol: Yes  INR 2 to 3   Plan:  1. D/c Heparin 2. Warfarin 5 mg x 1 dose at 1800 today 3. Will decrease ASA to 81 mg now that INR is therapeutic (discussed with MD) 4. Will continue to monitor for any signs/symptoms of bleeding and will follow up with PT/INR in the a.m.   Thank you for allowing pharmacy to be a part of this patient's care.  Alycia Rossetti, PharmD, BCPS Clinical Pharmacist Pager: [phone] Clinical phone for 04/12/2017 from 7a-3:30p: [phone] If after 3:30p, please call main pharmacy at: [phone] 04/12/2017 11:38  AM

## 2017-04-12 NOTE — Progress Notes (Signed)
Physical Therapy Treatment Patient Details Name: Ashlee Miles MRN: [id number] DOB: 1941/02/26 Today's Date: 04/12/2017    History of Present Illness Pt is a 76 y/o female admitted from [facility] secondary to R LE pain. Pt found to have R femoral vein DVT and has been on a heparin drip since 10/18. PMH including but not limited to ESRD    PT Comments    Continuing work on functional mobility and activity tolerance;  Agreeable to work with PT after HD, though hesitant; Overall, Ashlee Miles is moving better than her last acute stay; She tells me she is dc'ing home, and often home is the most therapeutic place for people; I can get behind dc home with HHPT/OT follow up, as well at at or near 24 hour assistance available -- worth considering getting a wheelchair if she is exhausted post HD -- Ashlee Miles therapists can help assess and facilitate getting a wheelchair  Follow Up Recommendations  SNF (to complete her rehab)     Equipment Recommendations  Rolling walker with 5" wheels;3in1 (PT);Wheelchair (measurements PT);Other (comment) (consider wheelchair if going home)    Recommendations for Other Services       Precautions / Restrictions Precautions Precautions: Fall    Mobility  Bed Mobility Overal bed mobility: Needs Assistance Bed Mobility: Supine to Sit     Supine to sit: Min assist     General bed mobility comments: increased time and effort, cueing for technique, use of bed rail, min assist to elevate trunk  Transfers Overall transfer level: Needs assistance Equipment used: Rolling walker (2 wheeled) Transfers: Sit to/from Stand Sit to Stand: Min assist         General transfer comment: Assist to power up and stabilize. Pt able to take side steps to reposition in bed.   Ambulation/Gait Ambulation/Gait assistance: Min guard Ambulation Distance (Feet):  (sidesteps toward head of bed) Assistive device: Rolling walker (2 wheeled)       General Gait  Details: pt only able to tolerate several small side steps at EOB with minguard A for stability   Stairs            Wheelchair Mobility    Modified Rankin (Stroke Patients Only)       Balance     Sitting balance-[name]: Fair Sitting balance - Comments: sitting EOB with no back support     Standing balance-[name]: Poor Standing balance comment: heavy reliance on RW                            Cognition Arousal/Alertness: Awake/alert Behavior During Therapy: WFL for tasks assessed/performed Overall Cognitive Status: Within Functional Limits for tasks assessed                                        Exercises      General Comments        Pertinent Vitals/Pain Pain Assessment: Faces Faces Pain Scale: Hurts a little bit Pain Location: General: noted girmace with movement Pain Intervention(s): Monitored during session    Home Living                      Prior Function            PT Goals (current goals can now be found in the care plan section) Acute Rehab PT  Goals Patient Stated Goal: get some rest PT Goal Formulation: With patient Time For Goal Achievement: 04/22/17 Potential to Achieve Goals: Good Progress towards PT goals: Progressing toward goals    Frequency    Min 2X/week      PT Plan Current plan remains appropriate (though per pt, she is changingher mind to go home)    Co-evaluation              AM-PAC PT "6 Clicks" Daily Activity  Outcome Measure  Difficulty turning over in bed (including adjusting bedclothes, sheets and blankets)?: A Little Difficulty moving from lying on back to sitting on the side of the bed? : A Lot Difficulty sitting down on and standing up from a chair with arms (e.g., wheelchair, bedside commode, etc,.)?: A Little Help needed moving to and from a bed to chair (including a wheelchair)?: A Little Help needed walking in hospital room?: A Little Help needed  climbing 3-5 steps with a railing? : Total 6 Click Score: 15    End of Session Equipment Utilized During Treatment: Gait belt Activity Tolerance: Patient tolerated treatment well;Patient limited by fatigue Patient left: in bed;with call bell/phone within reach;Other (comment);with bed alarm set (sitting EOB to eat) Nurse Communication: Mobility status PT Visit Diagnosis: Muscle weakness (generalized) (M62.81);Pain;Other abnormalities of gait and mobility (R26.89) Pain - Right/Left: Right Pain - part of body: Leg     Time: 1556-1610 PT Time Calculation (min) (ACUTE ONLY): 14 min  Charges:  $Therapeutic Activity: 8-22 mins                    G Codes:       Roney Marion, PT  Acute Rehabilitation Services Pager [phone] Office [place] 04/12/2017, 4:53 PM

## 2017-04-12 NOTE — Progress Notes (Signed)
[facility] Progress Note   Subjective:  No SOB. No new c/o.   Objective Vitals:   04/12/17 1130 04/12/17 1145 04/12/17 1210 04/12/17 1230  BP: (!) 98/45 (!) 119/53 (!) 113/56 (!) 131/56  Pulse: 62 65 63 61  Resp:      Temp:      TempSrc:      SpO2:      Weight:      Height:       Physical Exam NAD, chronically ill appearing BF. RRR, hard to appreciate due to body habitus (heart sounds distant) Lungs clear  Large pannus, soft NT abdomen No LE edema Dialysis Access: L thigh AVG - +bruit/thrill   Filed Weights   04/11/17 0327 04/11/17 2210 04/12/17 1045  Weight: 105.6 kg (232 lb 12.9 oz) 106 kg (233 lb 11 oz) 107.2 kg (236 lb 5.3 oz)    Intake/Output Summary (Last 24 hours) at 04/12/17 1321 Last data filed at 04/12/17 1000  Gross per 24 hour  Intake              960 ml  Output              525 ml  Net              435 ml     Recent Labs Lab 04/07/17 1457 04/10/17 2337 04/12/17 1012  NA 134* 137 136  K 5.1 4.1 3.4*  CL 101 102 104  CO2 26 27 25   GLUCOSE 223* 215* 158*  BUN 36* 39* 26*  CREATININE 4.67* 5.04* 3.79*  CALCIUM 8.1* 8.5* 7.3*  PHOS 4.3 3.1 3.3    Recent Labs Lab 04/07/17 1457 04/10/17 2337 04/12/17 1012  AST  --   --  27  ALT  --   --  14  ALKPHOS  --   --  55  BILITOT  --   --  0.4  PROT  --   --  5.1*  ALBUMIN 2.6* 2.7* 2.3*    Recent Labs Lab 04/05/17 1947  04/09/17 0456 04/10/17 0429 04/10/17 2337 04/11/17 1051 04/12/17 1012  WBC 6.8  < > 6.4 6.4 7.2 7.5 6.1  NEUTROABS 3.5  --   --   --   --   --  2.4  HGB 9.2*  < > 8.8* 9.0* 9.0* 9.0* 8.1*  HCT 28.7*  < > 27.5* 28.0* 28.3* 28.3* 25.1*  MCV 79.9  < > 79.3 79.1 79.5 80.2 79.7  PLT 113*  < > 129* 147* 218 152 140*  < > = values in this interval not displayed.    Recent Labs Lab 04/11/17 0741 04/11/17 1151 04/11/17 1707 04/11/17 2151 04/12/17 0756  GLUCAP 289* 246* 211* 251* 211*    Lab Results  Component Value Date   INR 2.15 04/12/2017   INR 1.65 04/11/2017   INR 1.14 04/10/2017   Studies/Results: No results found.  Medications: . sodium chloride    . sodium chloride    . sodium chloride    . sodium chloride     . [START ON 04/13/2017] aspirin EC  81 mg Oral Daily  . benzonatate  200 mg Oral TID  . carvedilol  6.25 mg Oral BID  . darbepoetin (ARANESP) injection - DIALYSIS  100 mcg Intravenous Q Fri-HD  . docusate sodium  100 mg Oral BID  . doxercalciferol      . doxercalciferol  2 mcg Intravenous Q M,W,F-HD  . famotidine  20 mg Oral Daily  .  feeding supplement (PRO-STAT SUGAR FREE 64)  30 mL Oral BID  . gabapentin  300 mg Oral TID  . heparin  4,000 Units Dialysis Once in dialysis  . insulin aspart  0-5 Units Subcutaneous QHS  . insulin aspart  0-9 Units Subcutaneous TID WC  . insulin glargine  8 Units Subcutaneous Daily  . linaclotide  145 mcg Oral QAC breakfast  . midodrine  10 mg Oral Q M,W,F-HD  . multivitamin  1 tablet Oral QHS  . pantoprazole  40 mg Oral Daily  . simvastatin  40 mg Oral Daily  . warfarin  5 mg Oral ONCE-1800  . Warfarin - Pharmacist Dosing Inpatient   Does not apply q1800    Dialysis Orders: MWF [facility] 4h   104kg    3K/2.25  L thigh AVG (R thigh TDC removed here)   Hep 4000 - Mircera 27mcg q 2wks - last 10/5 -Hectorol 73mcg IV qHD  Assessment: 1. DVT R leg 2/2 R fem cath, removed 10/22 - h/o PE with IVC filter placement. Started on coumadin and INR in range today.  OK to DC heparin per pharm. Per primary.  2. Cough - Improving. Resp panel neg, CXR unremarkable. Afeb.  3. ESRD - MWF.  R TDC dc'd due to DVT and L thigh AVG ready to use. Using AVG now. Added midodrine here for BP support during HD. 3. Anemia of CKD- Hgb stable 8- 9. Follow. Aranesp 145mcg IV qwk (Fri) started 10/19.  4. Secondary hyperparathyroidism - Ca and P in goal. Continue VDRA. 5. HTN/volume - bp's soft, added midodrine as above. Up 3kg , BP's better, UF to dry wt 6. Nutrition - Alb 2.7. Prostat BID, renavite,  Renal/Carb modified diet.  7. HF - EF 55-60%, per primary 8. DMT2 - per primary  Plan - HD today on schedule using AVG.  [place] for dc from renal standpoint.  [name] MD [employer] pgr [phone]   04/12/2017, 1:23 PM

## 2017-04-13 ENCOUNTER — Encounter: Payer: Medicare Other | Admitting: Vascular Surgery

## 2017-04-17 ENCOUNTER — Ambulatory Visit: Payer: 59 | Admitting: Family Medicine

## 2017-04-21 ENCOUNTER — Emergency Department (HOSPITAL_COMMUNITY): Payer: 59

## 2017-04-21 ENCOUNTER — Emergency Department (HOSPITAL_COMMUNITY)
Admission: EM | Admit: 2017-04-21 | Discharge: 2017-04-22 | Disposition: A | Discharge status: Home / Self Care | Payer: 59 | Attending: Emergency Medicine | Admitting: Emergency Medicine

## 2017-04-21 ENCOUNTER — Encounter (HOSPITAL_COMMUNITY): Payer: Self-pay | Admitting: Emergency Medicine

## 2017-04-21 DIAGNOSIS — W19XXXA Unspecified fall, initial encounter: Secondary | ICD-10-CM | POA: Diagnosis not present

## 2017-04-21 DIAGNOSIS — S42035A Nondisplaced fracture of lateral end of left clavicle, initial encounter for closed fracture: Secondary | ICD-10-CM | POA: Diagnosis not present

## 2017-04-21 DIAGNOSIS — Z794 Long term (current) use of insulin: Secondary | ICD-10-CM | POA: Insufficient documentation

## 2017-04-21 DIAGNOSIS — Y92009 Unspecified place in unspecified non-institutional (private) residence as the place of occurrence of the external cause: Secondary | ICD-10-CM | POA: Insufficient documentation

## 2017-04-21 DIAGNOSIS — R404 Transient alteration of awareness: Secondary | ICD-10-CM | POA: Diagnosis not present

## 2017-04-21 DIAGNOSIS — Z992 Dependence on renal dialysis: Secondary | ICD-10-CM | POA: Insufficient documentation

## 2017-04-21 DIAGNOSIS — M542 Cervicalgia: Secondary | ICD-10-CM | POA: Diagnosis not present

## 2017-04-21 DIAGNOSIS — Z8673 Personal history of transient ischemic attack (TIA), and cerebral infarction without residual deficits: Secondary | ICD-10-CM | POA: Diagnosis not present

## 2017-04-21 DIAGNOSIS — E162 Hypoglycemia, unspecified: Secondary | ICD-10-CM

## 2017-04-21 DIAGNOSIS — I132 Hypertensive heart and chronic kidney disease with heart failure and with stage 5 chronic kidney disease, or end stage renal disease: Secondary | ICD-10-CM | POA: Insufficient documentation

## 2017-04-21 DIAGNOSIS — E1122 Type 2 diabetes mellitus with diabetic chronic kidney disease: Secondary | ICD-10-CM | POA: Insufficient documentation

## 2017-04-21 DIAGNOSIS — E11649 Type 2 diabetes mellitus with hypoglycemia without coma: Secondary | ICD-10-CM | POA: Diagnosis not present

## 2017-04-21 DIAGNOSIS — Z7901 Long term (current) use of anticoagulants: Secondary | ICD-10-CM | POA: Insufficient documentation

## 2017-04-21 DIAGNOSIS — Y999 Unspecified external cause status: Secondary | ICD-10-CM | POA: Diagnosis not present

## 2017-04-21 DIAGNOSIS — Y939 Activity, unspecified: Secondary | ICD-10-CM | POA: Diagnosis not present

## 2017-04-21 DIAGNOSIS — M79604 Pain in right leg: Secondary | ICD-10-CM | POA: Diagnosis not present

## 2017-04-21 DIAGNOSIS — R51 Headache: Secondary | ICD-10-CM | POA: Diagnosis not present

## 2017-04-21 DIAGNOSIS — M79605 Pain in left leg: Secondary | ICD-10-CM | POA: Insufficient documentation

## 2017-04-21 DIAGNOSIS — N186 End stage renal disease: Secondary | ICD-10-CM | POA: Insufficient documentation

## 2017-04-21 DIAGNOSIS — I5032 Chronic diastolic (congestive) heart failure: Secondary | ICD-10-CM | POA: Insufficient documentation

## 2017-04-21 DIAGNOSIS — S42034A Nondisplaced fracture of lateral end of right clavicle, initial encounter for closed fracture: Secondary | ICD-10-CM

## 2017-04-21 LAB — CBC WITH DIFFERENTIAL/PLATELET
Basophils Absolute: 0 10*3/uL (ref 0.0–0.1)
Basophils Relative: 0 %
Eosinophils Absolute: 0.4 10*3/uL (ref 0.0–0.7)
Eosinophils Relative: 4 %
HCT: 32.2 % — ABNORMAL LOW (ref 36.0–46.0)
Hemoglobin: 10.4 g/dL — ABNORMAL LOW (ref 12.0–15.0)
Lymphocytes Relative: 19 %
Lymphs Abs: 1.7 10*3/uL (ref 0.7–4.0)
MCH: 25.9 pg — ABNORMAL LOW (ref 26.0–34.0)
MCHC: 32.3 g/dL (ref 30.0–36.0)
MCV: 80.3 fL (ref 78.0–100.0)
Monocytes Absolute: 0.7 10*3/uL (ref 0.1–1.0)
Monocytes Relative: 8 %
Neutro Abs: 6.2 10*3/uL (ref 1.7–7.7)
Neutrophils Relative %: 69 %
Platelets: 148 10*3/uL — ABNORMAL LOW (ref 150–400)
RBC: 4.01 MIL/uL (ref 3.87–5.11)
RDW: 16.5 % — ABNORMAL HIGH (ref 11.5–15.5)
WBC: 9 10*3/uL (ref 4.0–10.5)

## 2017-04-21 LAB — COMPREHENSIVE METABOLIC PANEL
ALT: 11 U/L — ABNORMAL LOW (ref 14–54)
AST: 25 U/L (ref 15–41)
Albumin: 3.2 g/dL — ABNORMAL LOW (ref 3.5–5.0)
Alkaline Phosphatase: 67 U/L (ref 38–126)
Anion gap: 10 (ref 5–15)
BUN: 19 mg/dL (ref 6–20)
CO2: 26 mmol/L (ref 22–32)
Calcium: 7.9 mg/dL — ABNORMAL LOW (ref 8.9–10.3)
Chloride: 96 mmol/L — ABNORMAL LOW (ref 101–111)
Creatinine, Ser: 3.09 mg/dL — ABNORMAL HIGH (ref 0.44–1.00)
GFR calc Af Amer: 16 mL/min — ABNORMAL LOW (ref >60)
GFR calc non Af Amer: 14 mL/min — ABNORMAL LOW (ref >60)
Glucose, Bld: 233 mg/dL — ABNORMAL HIGH (ref 65–99)
Potassium: 4.7 mmol/L (ref 3.5–5.1)
Sodium: 132 mmol/L — ABNORMAL LOW (ref 135–145)
Total Bilirubin: 0.4 mg/dL (ref 0.3–1.2)
Total Protein: 6.7 g/dL (ref 6.5–8.1)

## 2017-04-21 LAB — CBG MONITORING, ED: Glucose-Capillary: 50 mg/dL — ABNORMAL LOW (ref 65–99)

## 2017-04-21 MED ORDER — DEXTROSE 10 % IV BOLUS: 500.0000 mL | Freq: Once | INTRAVENOUS | Status: DC

## 2017-04-21 MED ORDER — HYDROCODONE-ACETAMINOPHEN 5-325 MG PO TABS
1.0000 | ORAL_TABLET | Freq: Once | ORAL | Status: AC
  Administered 2017-04-21: 1 via ORAL
  Filled 2017-04-21: qty 1

## 2017-04-21 MED ORDER — DEXTROSE 10 % IV BOLUS: 250.0000 mL | Freq: Once | INTRAVENOUS | Status: DC

## 2017-04-21 MED ORDER — DEXTROSE 50 % IV SOLN
INTRAVENOUS | Status: AC
  Administered 2017-04-21: 100 mL via INTRAVENOUS
  Filled 2017-04-21: qty 50

## 2017-04-21 MED ORDER — SODIUM CHLORIDE 0.9% FLUSH: 10.0000 mL | INTRAVENOUS | Status: DC | PRN

## 2017-04-21 MED ORDER — ONDANSETRON 4 MG PO TBDP
4.0000 mg | ORAL_TABLET | Freq: Once | ORAL | Status: AC
  Administered 2017-04-21: 4 mg via ORAL
  Filled 2017-04-21: qty 1

## 2017-04-21 MED ORDER — DEXTROSE 50 % IV SOLN
2.0000 | Freq: Once | INTRAVENOUS | Status: AC
  Administered 2017-04-21: 100 mL via INTRAVENOUS

## 2017-04-21 NOTE — ED Triage Notes (Signed)
Per EMS pt from home. Had dialysis today. WHen getting up from the toilet had syncopal episode and fell forward landing prone. Pt has a laceration to the bridge of the nose and bruising to left eye.  Complaints of generalized weakness.  Pt CBG on arrival was 50, given 15 of oral glucose. No IV established CBG to 60 en route and now back to 50.  On coumadin . History of clot in the past, lower extremity

## 2017-04-21 NOTE — ED Notes (Signed)
Charting of EMS report and acuity level assignment done by Sherrie Sport, RN and charted in error under Myna Bright NT

## 2017-04-21 NOTE — ED Provider Notes (Signed)
[facility] EMERGENCY DEPARTMENT Provider Note   CSN: [id number] Arrival date & time: 04/21/17  1922 History   Chief Complaint Chief Complaint  Patient presents with  . Hypoglycemia  . Fall   HPI Ashlee Miles is a 76 y.o. female with medical history of CHF, diabetes, ESRD on HD, who presents after hypoglycemic episode which caused a fall at home.  Upon EMS arrival patient was found to have glucose of 50.  She was given oral glucose by EMS with repeat blood glucose of 60 in route.  Upon arrival to the emergency department glucose was down to 50.  On further discussion with the patient and her daughter the patient admits to have not eaten very well today.  She complains of pain in her left shoulder head and neck and lower extremities.  HPI  Past Medical History:  Diagnosis Date  . CHF (congestive heart failure) ([place])   . Diabetes mellitus without complication ([place])   . ESRD (end stage renal disease) on dialysis ([place])   . History of pituitary adenoma   . Hypertension   . Protein C deficiency [facility])     Patient Active Problem List   Diagnosis Date Noted  . ESRD on dialysis ([place]) 04/06/2017  . Anemia due to chronic kidney disease 04/06/2017  . Thrombocytopenia ([place]) 04/06/2017  . Cough in adult 04/06/2017  . DVT (deep vein thrombosis) in pregnancy ([place]) 04/06/2017  . DVT (deep venous thrombosis) ([place]) 04/05/2017  . Pressure injury of skin 03/14/2017  . Acute metabolic encephalopathy 40/81/4481  . Skin lesion of hand 03/12/2017  . TIA (transient ischemic attack) 03/01/2017  . Syncope 03/01/2017  . 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 Essential hypertension 03/01/2017  . Chronic diastolic CHF (congestive heart failure) ([place]) 03/01/2017   Past Surgical History:  Procedure Laterality Date  . AV FISTULA PLACEMENT Left 03/16/2017   Procedure: INSERTION OF ARTERIOVENOUS (AV) GORE-TEX GRAFT THIGH-LEFT;  Surgeon: Angelia Mould, MD;  Location: [facility];  Service: Vascular;  Laterality: Left;  . CHOLECYSTECTOMY    . DIALYSIS FISTULA CREATION     clotted off  . INSERTION OF DIALYSIS CATHETER Right 03/16/2017   Procedure: INSERTION OF RIGHT FEMORAL TUNNELED DIALYSIS CATHETER;  Surgeon: Angelia Mould, MD;  Location: [facility];  Service: Vascular;  Laterality: Right;  . IVC FILTER INSERTION    . PORTACATH PLACEMENT     OB History    No data available     Home Medications    Prior to Admission medications   Medication Sig Start Date End Date Taking? Authorizing Provider  acetaminophen (TYLENOL) 325 MG tablet Take 650 mg by mouth every 6 (six) hours as needed for mild pain.   Yes [provider]  Amino Acids-Protein Hydrolys (FEEDING SUPPLEMENT, PRO-STAT SUGAR FREE 64,) LIQD Take 30 mLs by mouth 2 (two) times daily. 04/12/17   Raiford Noble Latif, DO  aspirin EC 81 MG EC tablet Take 1 tablet (81 mg total) by mouth daily. 04/13/17   Raiford Noble Latif, DO  calcitRIOL (ROCALTROL) 0.5 MCG capsule Take 0.5 mcg by mouth daily.    [provider]  camphor-menthol Timoteo Ace) lotion Apply 1 application topically every 8 (eight) hours as needed for itching. 04/12/17   Sheikh, Omair Latif, DO  carvedilol (COREG) 6.25 MG tablet Take 1 tablet (6.25 mg total) by mouth 2 (two) times daily. 03/04/17 03/04/18  Hosie Poisson, MD  cyclobenzaprine (FLEXERIL) 10 MG tablet Take 10 mg by  mouth as needed for muscle spasms.    [provider]  docusate sodium (COLACE) 100 MG capsule Take 1 capsule (100 mg total) by mouth 2 (two) times daily. 04/12/17   Raiford Noble Latif, DO  gabapentin (NEURONTIN) 300 MG capsule Take 300 mg by mouth 3 (three) times daily.    [provider]  guaiFENesin (MUCINEX) 600 MG 12 hr tablet Take 600 mg by mouth 2 (two) times daily.    [provider]  hydrOXYzine (ATARAX/VISTARIL) 25 MG tablet Take 1 tablet (25 mg total) by mouth every 8 (eight) hours as needed for itching. 04/12/17   Sheikh,  Omair Latif, DO  insulin aspart (NOVOLOG) 100 UNIT/ML injection Inject 0-9 Units into the skin 3 (three) times daily with meals. Patient taking differently: Inject 2-10 Units into the skin 3 (three) times daily with meals.  03/22/17   Debbe Odea, MD  insulin glargine (LANTUS) 100 UNIT/ML injection Inject 0.06 mLs (6 Units total) into the skin daily. 03/22/17   Debbe Odea, MD  ipratropium-albuterol (DUONEB) 0.5-2.5 (3) MG/3ML SOLN Take 3 mLs by nebulization every 4 (four) hours as needed. 04/12/17   Raiford Noble Latif, DO  linaclotide [facility]) 145 MCG CAPS capsule Take 145 mcg by mouth daily before breakfast.    [provider]  midodrine (PROAMATINE) 10 MG tablet Take 1 tablet (10 mg total) by mouth every Monday, Wednesday, and Friday with hemodialysis. 04/14/17   Raiford Noble Latif, DO  multivitamin (RENA-VIT) TABS tablet Take 1 tablet by mouth at bedtime. 04/12/17   Raiford Noble Latif, DO  Nutritional Supplements (FEEDING SUPPLEMENT, NEPRO CARB STEADY,) LIQD Take 237 mLs by mouth 3 (three) times daily as needed (Supplement). 04/12/17   Raiford Noble Latif, DO  omeprazole (PRILOSEC) 20 MG capsule Take 20 mg by mouth daily.    [provider]  OXYCODONE HCL PO Take 5 mg by mouth daily as needed. pain    [provider]  ranitidine (ZANTAC) 150 MG tablet Take 150 mg by mouth 2 (two) times daily.    [provider]  simvastatin (ZOCOR) 40 MG tablet Take 1 tablet (40 mg total) by mouth daily. 03/05/17   Hosie Poisson, MD  triamcinolone cream (KENALOG) 0.1 % Apply 1 application topically 2 (two) times daily. Use on her hand    [provider]  warfarin (COUMADIN) 5 MG tablet Take 1 tablet (5 mg total) by mouth daily at 6 PM. 04/12/17   Kerney Elbe, DO   Family History Family History  Problem Relation Age of Onset  . Breast cancer Mother   . Diabetes Sister   . Diabetes Brother   . CAD Other   . Stroke Neg Hx    Social History Social  History  Substance Use Topics  . Smoking status: Never Smoker  . Smokeless tobacco: Never Used  . Alcohol use No   Allergies   Patient has no known allergies.  Review of Systems Review of Systems  Constitutional: Negative for chills and fever.  HENT: Negative for ear pain and sore throat.   Eyes: Negative for pain and visual disturbance.  Respiratory: Negative for cough and shortness of breath.   Cardiovascular: Negative for chest pain and palpitations.  Gastrointestinal: Negative for abdominal pain and vomiting.  Genitourinary: Negative for dysuria and hematuria.  Musculoskeletal: Negative for arthralgias and back pain.  Skin: Negative for color change and rash.  Neurological: Negative for seizures and syncope.  All other systems reviewed and are negative.  Physical  Exam Updated Vital Signs BP (!) 125/53   Pulse 77   Temp 98.1 F (36.7 C) (Oral)   Resp 18   Ht 5' 1"  (1.549 m)   Wt 102.1 kg (225 lb)   SpO2 100%   BMI 42.51 kg/m   Physical Exam  Constitutional: She is oriented to person, place, and time. She appears well-developed and well-nourished. No distress.  HENT:  Head: Normocephalic and atraumatic.  Eyes: Conjunctivae are normal.  Neck: Neck supple.  Cardiovascular: Normal rate and regular rhythm.   No murmur heard. Pulmonary/Chest: Effort normal and breath sounds normal. No respiratory distress.  Abdominal: Soft. There is no tenderness.  Musculoskeletal: She exhibits tenderness (over the right shoulder and clavicle ). She exhibits no edema or deformity.  Neurological: She is alert and oriented to person, place, and time.  Skin: Skin is warm and dry. Bruising and ecchymosis noted.  Psychiatric: She has a normal mood and affect.  Nursing note and vitals reviewed.  ED Treatments / Results  Labs (all labs ordered are listed, but only abnormal results are displayed) Labs Reviewed  CBC WITH DIFFERENTIAL/PLATELET - Abnormal; Notable for the following:        Result Value   Hemoglobin 10.4 (*)    HCT 32.2 (*)    MCH 25.9 (*)    RDW 16.5 (*)    Platelets 148 (*)    All other components within normal limits  COMPREHENSIVE METABOLIC PANEL - Abnormal; Notable for the following:    Sodium 132 (*)    Chloride 96 (*)    Glucose, Bld 233 (*)    Creatinine, Ser 3.09 (*)    Calcium 7.9 (*)    Albumin 3.2 (*)    ALT 11 (*)    GFR calc non Af Amer 14 (*)    GFR calc Af Amer 16 (*)    All other components within normal limits  CBG MONITORING, ED - Abnormal; Notable for the following:    Glucose-Capillary 50 (*)    All other components within normal limits  CBG MONITORING, ED   EKG  EKG Interpretation None       Radiology Dg Wrist Complete Left  Result Date: 04/22/2017 CLINICAL DATA:  Fall from commode with left wrist pain. EXAM: LEFT WRIST - COMPLETE 3+ VIEW COMPARISON:  None. FINDINGS: There is no evidence of fracture or dislocation. Mild radiocarpal osteoarthritis with joint space narrowing in radial styloid spurring. Soft tissues are unremarkable. IMPRESSION: Negative for fracture or subluxation. Electronically Signed   By: Jeb Levering M.D.   On: 04/22/2017 00:49   Dg Tibia/fibula Left  Result Date: 04/22/2017 CLINICAL DATA:  Fall from commode with left lower extremity pain. EXAM: LEFT TIBIA AND FIBULA - 2 VIEW COMPARISON:  None. FINDINGS: There is no evidence of fracture or other focal bone lesions. Degenerative change at the knee. Plantar calcaneal spur are. Mild diffuse soft tissue edema versus habitus. IMPRESSION: No fracture of the left lower leg. Electronically Signed   By: Jeb Levering M.D.   On: 04/22/2017 00:46   Dg Tibia/fibula Right  Result Date: 04/22/2017 CLINICAL DATA:  Fall from commode with right lower extremity pain. EXAM: RIGHT TIBIA AND FIBULA - 2 VIEW COMPARISON:  None. FINDINGS: There is no evidence of fracture or other focal bone lesions. Right knee arthroplasty appears intact. Rounded calcifications in the  anterior soft tissues likely phleboliths. Soft tissue edema versus habitus. IMPRESSION: No fracture of the right lower leg.  Intact right knee arthroplasty. Electronically  Signed   By: Jeb Levering M.D.   On: 04/22/2017 00:47   Ct Head Wo Contrast  Result Date: 04/21/2017 CLINICAL DATA:  76 year old female with head trauma. EXAM: CT HEAD WITHOUT CONTRAST CT CERVICAL SPINE WITHOUT CONTRAST TECHNIQUE: Multidetector CT imaging of the head and cervical spine was performed following the standard protocol without intravenous contrast. Multiplanar CT image reconstructions of the cervical spine were also generated. COMPARISON:  Head CT dated 03/12/2017 FINDINGS: CT HEAD FINDINGS Brain: The ventricles and sulci appropriate size for patient's age. Minimal periventricular and deep white matter chronic microvascular ischemic changes noted. There is no acute intracranial hemorrhage. No mass effect or midline shift noted. No extra-axial fluid collection. Vascular: No hyperdense vessel or unexpected calcification. Skull:  No acute calvarial pathology. Sinuses/Orbits: The visualized paranasal sinuses are clear. Mild left mastoid effusion. The right mastoid air cells are clear. Other: None CT CERVICAL SPINE FINDINGS Alignment: No acute subluxation. Skull base and vertebrae: No acute fracture. No primary bone lesion or focal pathologic process. Soft tissues and spinal canal: No prevertebral fluid or swelling. No visible canal hematoma. Disc levels: C4-C5 disc spacer and anterior fusion plate and screws noted. There are multilevel degenerative changes of the spine with osteophytes. Upper chest: Negative. Other: Atherosclerotic calcification of the vessels of the aortic arch. IMPRESSION: 1. No acute intracranial pathology. 2. No acute/traumatic cervical spine pathology. 3. Degenerative changes of the cervical spine and C4-C5 disc spacer and anterior fusion. Electronically Signed   By: Anner Crete M.D.   On: 04/21/2017  20:41   Ct Cervical Spine Wo Contrast  Result Date: 04/21/2017 CLINICAL DATA:  76 year old female with head trauma. EXAM: CT HEAD WITHOUT CONTRAST CT CERVICAL SPINE WITHOUT CONTRAST TECHNIQUE: Multidetector CT imaging of the head and cervical spine was performed following the standard protocol without intravenous contrast. Multiplanar CT image reconstructions of the cervical spine were also generated. COMPARISON:  Head CT dated 03/12/2017 FINDINGS: CT HEAD FINDINGS Brain: The ventricles and sulci appropriate size for patient's age. Minimal periventricular and deep white matter chronic microvascular ischemic changes noted. There is no acute intracranial hemorrhage. No mass effect or midline shift noted. No extra-axial fluid collection. Vascular: No hyperdense vessel or unexpected calcification. Skull:  No acute calvarial pathology. Sinuses/Orbits: The visualized paranasal sinuses are clear. Mild left mastoid effusion. The right mastoid air cells are clear. Other: None CT CERVICAL SPINE FINDINGS Alignment: No acute subluxation. Skull base and vertebrae: No acute fracture. No primary bone lesion or focal pathologic process. Soft tissues and spinal canal: No prevertebral fluid or swelling. No visible canal hematoma. Disc levels: C4-C5 disc spacer and anterior fusion plate and screws noted. There are multilevel degenerative changes of the spine with osteophytes. Upper chest: Negative. Other: Atherosclerotic calcification of the vessels of the aortic arch. IMPRESSION: 1. No acute intracranial pathology. 2. No acute/traumatic cervical spine pathology. 3. Degenerative changes of the cervical spine and C4-C5 disc spacer and anterior fusion. Electronically Signed   By: Anner Crete M.D.   On: 04/21/2017 20:41   Dg Chest Portable 1 View  Result Date: 04/21/2017 CLINICAL DATA:  Patient fell off commode striking right shoulder nose. Right shoulder pain. EXAM: PORTABLE CHEST 1 VIEW COMPARISON:  None. FINDINGS: The  cardiopericardial silhouette is enlarged with moderate aortic atherosclerosis at the arch. No acute pulmonary consolidation, effusion or pulmonary edema. Port catheter is noted with tip in the distal SVC. Vascular stent projects over the left upper arm. ACDF of the included lower cervical spine. Subtle lucency of  the distal right clavicle without displacement the suspicious for nondisplaced fracture. IMPRESSION: 1. Subtle lucency of the distal right clavicle without displacement. Findings are suspicious for nondisplaced fracture. 2. Cardiomegaly with aortic atherosclerosis. No active pulmonary disease. Electronically Signed   By: Ashley Royalty M.D.   On: 04/21/2017 20:18   Dg Shoulder Right Portable  Result Date: 04/21/2017 CLINICAL DATA:  Right shoulder pain after fall. EXAM: PORTABLE RIGHT SHOULDER COMPARISON:  None. FINDINGS: Subtle lucency of the distal right clavicle without displacement is noted similar to same day chest radiograph findings. Findings are suspicious for nondisplaced distal clavicular fracture without intra-articular involvement. AC glenohumeral joints are maintained. Port catheter is noted along the upper right chest wall with tip in the distal SVC. ACDF of the lower cervical spine. Axillary clips are seen on the right. No adjacent rib fracture. The included right lung is unremarkable. IMPRESSION: 1. Subtle lucency is noted involving the distal right clavicle suspicious for a nondisplaced fracture. This lucency is also seen on the same day chest radiograph. 2. Intact AC and glenohumeral joints. Electronically Signed   By: Ashley Royalty M.D.   On: 04/21/2017 20:21   Procedures Procedures (including critical care time)  Medications Ordered in ED Medications  sodium chloride flush (NS) 0.9 % injection 10-40 mL (not administered)  dextrose 50 % solution 100 mL (100 mLs Intravenous Given 04/21/17 2057)  HYDROcodone-acetaminophen (NORCO/VICODIN) 5-325 MG per tablet 1 tablet (1 tablet Oral  Given 04/21/17 2234)  ondansetron (ZOFRAN-ODT) disintegrating tablet 4 mg (4 mg Oral Given 04/21/17 2234)   Initial Impression / Assessment and Plan / ED Course  I have reviewed the triage vital signs and the nursing notes.  Pertinent labs & imaging results that were available during my care of the patient were reviewed by me and considered in my medical decision making (see chart for details).  Pt is a 76 y.o. female with pertinent PMHX of DM, HTN, ESRD on HD and DVT who presents w/ fall and episodes of hypoglycemia.   The patient is elderly and has multiple comorbidities.  The patient does take anticoagulants. Will obtain head CT and CT of head and neck.  Basic labs performed.   Repeat glucose in the emergency department markedly improved.  Only pertinent findings on imaging revealed: Subtle lucency is noted involving the distal right clavicle suspicious for a nondisplaced fracture  Patient able to eat and drink in the emergency department without difficulty.  Labs and imaging reviewed by myself and considered in medical decision making if ordered.  Imaging interpreted by radiology.  Upon learning that I would be discharging the patient that they were too concerned and felt unsafe taking her home.  I I explained to the family that at this time the patient met note admission criteria but we be more than happy to observe her in the emergency department have social work come to discuss possible nursing home placement in the morning.  The patient's daughter was happy with that plan.  However became extremely agitated and irate her cussing and yelling at staff when they explained that they had to move the patient out of the room and into the hallway to make room for new patients.   Patient's daughter stated that she would be leaving the ED taking her mother with her.  Patient was offered to have social work called him in the morning for assistance with possible nursing home placement the patient's  daughter agreed.  After leaving the patient's daughter came back to request  pain medication for her mother however I explained to staff that because the patient is a fall risk I did not feel comfortable prescribing narcotics and that because of her renal failure the patient cannot take NSAIDs.  Advised to take Tylenol for pain.   Patient discharged in stable condition  The plan for this patient was discussed with Dr. Ashok Cordia, who voiced agreement and who oversaw evaluation and treatment of this patient.   Final Clinical Impressions(s) / ED Diagnoses   Final diagnoses:  Hypoglycemia  Transient alteration of awareness  Closed nondisplaced fracture of acromial end of right clavicle, initial encounter   New Prescriptions New Prescriptions   No medications on file     Fenton Foy, MD 04/22/17 [id number]    Lajean Saver, MD 04/22/17 1547

## 2017-04-21 NOTE — ED Notes (Signed)
Pt reports sitting on the commode and fell off same, striking her nose and right shoulder. Pt complains of right shoulder pain.

## 2017-04-22 ENCOUNTER — Emergency Department (HOSPITAL_COMMUNITY): Payer: 59

## 2017-04-22 DIAGNOSIS — R531 Weakness: Secondary | ICD-10-CM | POA: Diagnosis not present

## 2017-04-22 DIAGNOSIS — K2900 Acute gastritis without bleeding: Secondary | ICD-10-CM | POA: Diagnosis not present

## 2017-04-22 LAB — CBG MONITORING, ED: Glucose-Capillary: 229 mg/dL — ABNORMAL HIGH (ref 65–99)

## 2017-04-22 NOTE — ED Notes (Signed)
SOCIAL WORK- Please contact the patient at home.

## 2017-04-22 NOTE — ED Notes (Signed)
Patient was placed as a boarder. Charge RN advised this RN to move the patient to the hallway for room usage. Family then asked about next dose of pain medication and this RN started to explain that she has been discharged but I could ask for something. At this point family became irate and starting cussing this RN out. Security escorted family out to the lobby. Spoke with patient and she advised she just wanted to go home. Advised pt that this RN would have social work contact her at home. Pt was satisfied with same.

## 2017-04-25 ENCOUNTER — Inpatient Hospital Stay (HOSPITAL_COMMUNITY): Payer: 59

## 2017-04-25 ENCOUNTER — Emergency Department (HOSPITAL_COMMUNITY): Payer: 59

## 2017-04-25 ENCOUNTER — Inpatient Hospital Stay (HOSPITAL_COMMUNITY)
Admission: EM | Admit: 2017-04-25 | Discharge: 2017-04-28 | DRG: 391 | Disposition: A | Discharge status: Skilled Nursing Facility | Payer: 59 | Attending: Internal Medicine | Admitting: Internal Medicine

## 2017-04-25 ENCOUNTER — Encounter (HOSPITAL_COMMUNITY): Payer: Self-pay | Admitting: Emergency Medicine

## 2017-04-25 DIAGNOSIS — K5900 Constipation, unspecified: Secondary | ICD-10-CM

## 2017-04-25 DIAGNOSIS — R509 Fever, unspecified: Secondary | ICD-10-CM

## 2017-04-25 DIAGNOSIS — I132 Hypertensive heart and chronic kidney disease with heart failure and with stage 5 chronic kidney disease, or end stage renal disease: Secondary | ICD-10-CM | POA: Diagnosis present

## 2017-04-25 DIAGNOSIS — R296 Repeated falls: Secondary | ICD-10-CM

## 2017-04-25 DIAGNOSIS — W19XXXA Unspecified fall, initial encounter: Secondary | ICD-10-CM | POA: Diagnosis not present

## 2017-04-25 DIAGNOSIS — Z7901 Long term (current) use of anticoagulants: Secondary | ICD-10-CM

## 2017-04-25 DIAGNOSIS — H1132 Conjunctival hemorrhage, left eye: Secondary | ICD-10-CM | POA: Diagnosis present

## 2017-04-25 DIAGNOSIS — E1122 Type 2 diabetes mellitus with diabetic chronic kidney disease: Secondary | ICD-10-CM | POA: Diagnosis present

## 2017-04-25 DIAGNOSIS — I959 Hypotension, unspecified: Secondary | ICD-10-CM | POA: Diagnosis not present

## 2017-04-25 DIAGNOSIS — W19XXXS Unspecified fall, sequela: Secondary | ICD-10-CM

## 2017-04-25 DIAGNOSIS — I82401 Acute embolism and thrombosis of unspecified deep veins of right lower extremity: Secondary | ICD-10-CM | POA: Diagnosis present

## 2017-04-25 DIAGNOSIS — I5032 Chronic diastolic (congestive) heart failure: Secondary | ICD-10-CM | POA: Diagnosis present

## 2017-04-25 DIAGNOSIS — Z6841 Body Mass Index (BMI) 40.0 and over, adult: Secondary | ICD-10-CM

## 2017-04-25 DIAGNOSIS — D72829 Elevated white blood cell count, unspecified: Secondary | ICD-10-CM

## 2017-04-25 DIAGNOSIS — N2581 Secondary hyperparathyroidism of renal origin: Secondary | ICD-10-CM | POA: Diagnosis present

## 2017-04-25 DIAGNOSIS — F4489 Other dissociative and conversion disorders: Secondary | ICD-10-CM | POA: Diagnosis not present

## 2017-04-25 DIAGNOSIS — I953 Hypotension of hemodialysis: Secondary | ICD-10-CM | POA: Diagnosis not present

## 2017-04-25 DIAGNOSIS — Z794 Long term (current) use of insulin: Secondary | ICD-10-CM | POA: Diagnosis not present

## 2017-04-25 DIAGNOSIS — N186 End stage renal disease: Secondary | ICD-10-CM | POA: Diagnosis present

## 2017-04-25 DIAGNOSIS — Z66 Do not resuscitate: Secondary | ICD-10-CM | POA: Diagnosis present

## 2017-04-25 DIAGNOSIS — Z79899 Other long term (current) drug therapy: Secondary | ICD-10-CM | POA: Diagnosis not present

## 2017-04-25 DIAGNOSIS — E118 Type 2 diabetes mellitus with unspecified complications: Secondary | ICD-10-CM | POA: Diagnosis not present

## 2017-04-25 DIAGNOSIS — R531 Weakness: Secondary | ICD-10-CM | POA: Diagnosis present

## 2017-04-25 DIAGNOSIS — K2900 Acute gastritis without bleeding: Principal | ICD-10-CM

## 2017-04-25 DIAGNOSIS — Z7982 Long term (current) use of aspirin: Secondary | ICD-10-CM | POA: Diagnosis not present

## 2017-04-25 DIAGNOSIS — E785 Hyperlipidemia, unspecified: Secondary | ICD-10-CM | POA: Diagnosis present

## 2017-04-25 DIAGNOSIS — D631 Anemia in chronic kidney disease: Secondary | ICD-10-CM | POA: Diagnosis present

## 2017-04-25 DIAGNOSIS — Z86711 Personal history of pulmonary embolism: Secondary | ICD-10-CM

## 2017-04-25 DIAGNOSIS — N390 Urinary tract infection, site not specified: Secondary | ICD-10-CM | POA: Diagnosis present

## 2017-04-25 DIAGNOSIS — Z86718 Personal history of other venous thrombosis and embolism: Secondary | ICD-10-CM | POA: Diagnosis not present

## 2017-04-25 DIAGNOSIS — I82409 Acute embolism and thrombosis of unspecified deep veins of unspecified lower extremity: Secondary | ICD-10-CM | POA: Diagnosis present

## 2017-04-25 DIAGNOSIS — R451 Restlessness and agitation: Secondary | ICD-10-CM | POA: Diagnosis present

## 2017-04-25 DIAGNOSIS — Z992 Dependence on renal dialysis: Secondary | ICD-10-CM | POA: Diagnosis not present

## 2017-04-25 DIAGNOSIS — F0390 Unspecified dementia without behavioral disturbance: Secondary | ICD-10-CM | POA: Diagnosis present

## 2017-04-25 DIAGNOSIS — D6859 Other primary thrombophilia: Secondary | ICD-10-CM | POA: Diagnosis present

## 2017-04-25 DIAGNOSIS — R1013 Epigastric pain: Secondary | ICD-10-CM

## 2017-04-25 LAB — CBC
HCT: 30.1 % — ABNORMAL LOW (ref 36.0–46.0)
Hemoglobin: 9.3 g/dL — ABNORMAL LOW (ref 12.0–15.0)
MCH: 25 pg — ABNORMAL LOW (ref 26.0–34.0)
MCHC: 30.9 g/dL (ref 30.0–36.0)
MCV: 80.9 fL (ref 78.0–100.0)
Platelets: 285 10*3/uL (ref 150–400)
RBC: 3.72 MIL/uL — ABNORMAL LOW (ref 3.87–5.11)
RDW: 16.2 % — ABNORMAL HIGH (ref 11.5–15.5)
WBC: 19.3 10*3/uL — ABNORMAL HIGH (ref 4.0–10.5)

## 2017-04-25 LAB — COMPREHENSIVE METABOLIC PANEL
ALT: 11 U/L — ABNORMAL LOW (ref 14–54)
AST: 26 U/L (ref 15–41)
Albumin: 3 g/dL — ABNORMAL LOW (ref 3.5–5.0)
Alkaline Phosphatase: 74 U/L (ref 38–126)
Anion gap: 17 — ABNORMAL HIGH (ref 5–15)
BUN: 18 mg/dL (ref 6–20)
CO2: 24 mmol/L (ref 22–32)
Calcium: 7.8 mg/dL — ABNORMAL LOW (ref 8.9–10.3)
Chloride: 97 mmol/L — ABNORMAL LOW (ref 101–111)
Creatinine, Ser: 3.99 mg/dL — ABNORMAL HIGH (ref 0.44–1.00)
GFR calc Af Amer: 12 mL/min — ABNORMAL LOW (ref >60)
GFR calc non Af Amer: 10 mL/min — ABNORMAL LOW (ref >60)
Glucose, Bld: 228 mg/dL — ABNORMAL HIGH (ref 65–99)
Potassium: 4.3 mmol/L (ref 3.5–5.1)
Sodium: 138 mmol/L (ref 135–145)
Total Bilirubin: 0.9 mg/dL (ref 0.3–1.2)
Total Protein: 6.4 g/dL — ABNORMAL LOW (ref 6.5–8.1)

## 2017-04-25 LAB — LIPASE, BLOOD: Lipase: 17 U/L (ref 11–51)

## 2017-04-25 LAB — URINALYSIS, ROUTINE W REFLEX MICROSCOPIC
Bilirubin Urine: NEGATIVE
Glucose, UA: NEGATIVE mg/dL
Hgb urine dipstick: NEGATIVE
Ketones, ur: 20 mg/dL — AB
Nitrite: NEGATIVE
Protein, ur: 100 mg/dL — AB
Specific Gravity, Urine: 1.013 (ref 1.005–1.030)
pH: 8 (ref 5.0–8.0)

## 2017-04-25 LAB — GLUCOSE, CAPILLARY: Glucose-Capillary: 201 mg/dL — ABNORMAL HIGH (ref 65–99)

## 2017-04-25 MED ORDER — HYDROMORPHONE HCL 1 MG/ML IJ SOLN
0.2500 mg | Freq: Once | INTRAMUSCULAR | Status: AC
  Administered 2017-04-25: 0.25 mg via INTRAVENOUS
  Filled 2017-04-25: qty 1

## 2017-04-25 MED ORDER — INSULIN GLARGINE 100 UNIT/ML ~~LOC~~ SOLN
3.0000 [IU] | Freq: Every day | SUBCUTANEOUS | Status: DC
  Administered 2017-04-26 – 2017-04-27 (×2): 3 [IU] via SUBCUTANEOUS
  Filled 2017-04-25 (×3): qty 0.03

## 2017-04-25 MED ORDER — CARVEDILOL 6.25 MG PO TABS
6.2500 mg | ORAL_TABLET | Freq: Two times a day (BID) | ORAL | Status: DC
  Administered 2017-04-26 – 2017-04-28 (×5): 6.25 mg via ORAL
  Filled 2017-04-25 (×5): qty 1

## 2017-04-25 MED ORDER — SIMVASTATIN 40 MG PO TABS
40.0000 mg | ORAL_TABLET | Freq: Every day | ORAL | Status: DC
  Administered 2017-04-26 – 2017-04-28 (×3): 40 mg via ORAL
  Filled 2017-04-25 (×3): qty 1

## 2017-04-25 MED ORDER — PANTOPRAZOLE SODIUM 40 MG PO TBEC
80.0000 mg | DELAYED_RELEASE_TABLET | Freq: Every day | ORAL | Status: DC
  Administered 2017-04-26 – 2017-04-28 (×3): 80 mg via ORAL
  Filled 2017-04-25 (×3): qty 2

## 2017-04-25 MED ORDER — INSULIN ASPART 100 UNIT/ML ~~LOC~~ SOLN
0.0000 [IU] | Freq: Every day | SUBCUTANEOUS | Status: DC
  Administered 2017-04-25: 2 [IU] via SUBCUTANEOUS

## 2017-04-25 MED ORDER — IPRATROPIUM-ALBUTEROL 0.5-2.5 (3) MG/3ML IN SOLN: 3.0000 mL | RESPIRATORY_TRACT | Status: DC | PRN

## 2017-04-25 MED ORDER — CALCITRIOL 0.5 MCG PO CAPS
0.5000 ug | ORAL_CAPSULE | Freq: Every day | ORAL | Status: DC
  Administered 2017-04-26 – 2017-04-28 (×3): 0.5 ug via ORAL
  Filled 2017-04-25 (×4): qty 1

## 2017-04-25 MED ORDER — ONDANSETRON HCL 4 MG PO TABS: 4.0000 mg | ORAL_TABLET | Freq: Four times a day (QID) | ORAL | Status: DC | PRN

## 2017-04-25 MED ORDER — INSULIN ASPART 100 UNIT/ML ~~LOC~~ SOLN
0.0000 [IU] | Freq: Three times a day (TID) | SUBCUTANEOUS | Status: DC
  Administered 2017-04-27: 5 [IU] via SUBCUTANEOUS
  Administered 2017-04-27: 3 [IU] via SUBCUTANEOUS
  Administered 2017-04-27: 2 [IU] via SUBCUTANEOUS

## 2017-04-25 MED ORDER — FAMOTIDINE 20 MG PO TABS
20.0000 mg | ORAL_TABLET | Freq: Two times a day (BID) | ORAL | Status: DC
  Administered 2017-04-25: 20 mg via ORAL
  Filled 2017-04-25: qty 1

## 2017-04-25 MED ORDER — ONDANSETRON HCL 4 MG/2ML IJ SOLN: 4.0000 mg | Freq: Four times a day (QID) | INTRAMUSCULAR | Status: DC | PRN

## 2017-04-25 MED ORDER — SODIUM CHLORIDE 0.9 % IV SOLN
250.0000 mL | INTRAVENOUS | Status: DC | PRN
  Administered 2017-04-27: 250 mL via INTRAVENOUS

## 2017-04-25 MED ORDER — CYCLOBENZAPRINE HCL 10 MG PO TABS: 10.0000 mg | ORAL_TABLET | ORAL | Status: DC | PRN

## 2017-04-25 MED ORDER — ALUM & MAG HYDROXIDE-SIMETH 200-200-20 MG/5ML PO SUSP
15.0000 mL | Freq: Once | ORAL | Status: AC
  Administered 2017-04-25: 15 mL via ORAL
  Filled 2017-04-25: qty 30

## 2017-04-25 MED ORDER — MORPHINE SULFATE (PF) 2 MG/ML IV SOLN
1.0000 mg | Freq: Once | INTRAVENOUS | Status: AC
  Administered 2017-04-25: 1 mg via INTRAVENOUS
  Filled 2017-04-25: qty 1

## 2017-04-25 MED ORDER — HYDROXYZINE HCL 25 MG PO TABS: 25.0000 mg | ORAL_TABLET | Freq: Three times a day (TID) | ORAL | Status: DC | PRN

## 2017-04-25 MED ORDER — SODIUM CHLORIDE 0.9% FLUSH
3.0000 mL | Freq: Two times a day (BID) | INTRAVENOUS | Status: DC
  Administered 2017-04-25 – 2017-04-27 (×2): 3 mL via INTRAVENOUS

## 2017-04-25 MED ORDER — ONDANSETRON HCL 4 MG/2ML IJ SOLN
4.0000 mg | Freq: Once | INTRAMUSCULAR | Status: AC
  Administered 2017-04-25: 4 mg via INTRAVENOUS
  Filled 2017-04-25: qty 2

## 2017-04-25 MED ORDER — LINACLOTIDE 145 MCG PO CAPS
145.0000 ug | ORAL_CAPSULE | Freq: Every day | ORAL | Status: DC
  Administered 2017-04-26 – 2017-04-28 (×3): 145 ug via ORAL
  Filled 2017-04-25 (×3): qty 1

## 2017-04-25 MED ORDER — ASPIRIN EC 325 MG PO TBEC
325.0000 mg | DELAYED_RELEASE_TABLET | Freq: Every day | ORAL | Status: DC
  Administered 2017-04-25 – 2017-04-28 (×4): 325 mg via ORAL
  Filled 2017-04-25 (×4): qty 1

## 2017-04-25 MED ORDER — MIDODRINE HCL 5 MG PO TABS
10.0000 mg | ORAL_TABLET | ORAL | Status: DC
  Administered 2017-04-26 – 2017-04-28 (×2): 10 mg via ORAL

## 2017-04-25 MED ORDER — SUCRALFATE 1 GM/10ML PO SUSP
1.0000 g | Freq: Three times a day (TID) | ORAL | Status: DC
  Administered 2017-04-25 – 2017-04-28 (×9): 1 g via ORAL
  Filled 2017-04-25 (×9): qty 10

## 2017-04-25 MED ORDER — SODIUM CHLORIDE 0.9% FLUSH: 3.0000 mL | INTRAVENOUS | Status: DC | PRN

## 2017-04-25 MED ORDER — RENA-VITE PO TABS
1.0000 | ORAL_TABLET | Freq: Every day | ORAL | Status: DC
  Administered 2017-04-25 – 2017-04-27 (×3): 1 via ORAL
  Filled 2017-04-25 (×3): qty 1

## 2017-04-25 NOTE — ED Triage Notes (Signed)
Pt is dialysis pt from home was brought by [facility]. Pt has c/o of mid abd pain that started today. Pt also had a fall off of commode 3 days ago. Pt's left wrist is swollen, pt has blood in left eye and knot on bridge of nose. Pt is alert and ox4.

## 2017-04-25 NOTE — ED Notes (Signed)
Gave pt water, per Dr. Tyrone Nine.

## 2017-04-25 NOTE — ED Provider Notes (Signed)
[facility] EMERGENCY DEPARTMENT Provider Note   CSN: [id number] Arrival date & time: 04/25/17  1118     History   Chief Complaint Chief Complaint  Patient presents with  . Abdominal Pain    HPI Ashlee Miles is a 76 y.o. female.  76 yo F with a chief complaint of epigastric abdominal pain decreased oral intake and generalized weakness.  This been going on for the past week.  The family is having trouble taking care of her as she is having difficulty figuring out how to take her diabetes medicines.  She has not been eating and drinking due to epigastric abdominal pain worsens with it.  Home health nurse was there today discovered that she had a fever of 101.  She denies cough congestion denies nausea vomiting or diarrhea.  Denies dysuria increased frequency or hesitancy.  She was recently started on dialysis.  Last had dialysis yesterday.   The history is provided by the patient.  Abdominal Pain   This is a new problem. The current episode started yesterday. The problem occurs constantly. The problem has not changed since onset.The pain is located in the epigastric region. The quality of the pain is burning. The pain is at a severity of 10/10. The pain is severe. Associated symptoms include nausea and vomiting. Pertinent negatives include fever, dysuria, headaches, arthralgias and myalgias. Nothing aggravates the symptoms. Nothing relieves the symptoms.    Past Medical History:  Diagnosis Date  . CHF (congestive heart failure) ([place])   . Diabetes mellitus without complication ([place])   . ESRD (end stage renal disease) on dialysis ([place])   . History of pituitary adenoma   . Hypertension   . Protein C deficiency [facility])     Patient Active Problem List   Diagnosis Date Noted  . ESRD (end stage renal disease) on dialysis ([place])   . ESRD on dialysis ([place]) 04/06/2017  . Anemia due to chronic kidney disease 04/06/2017  . Thrombocytopenia ([place]) 04/06/2017  . Cough in  adult 04/06/2017  . DVT (deep vein thrombosis) in pregnancy ([place]) 04/06/2017  . DVT (deep venous thrombosis) ([facility]) 04/05/2017  . Pressure injury of skin 03/14/2017  . Acute metabolic encephalopathy 78/46/9629  . Skin lesion of hand 03/12/2017  . TIA (transient ischemic attack) 03/01/2017  . Syncope 03/01/2017  . 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 Essential hypertension 03/01/2017  . Chronic diastolic CHF (congestive heart failure) ([place]) 03/01/2017    Past Surgical History:  Procedure Laterality Date  . CHOLECYSTECTOMY    . DIALYSIS FISTULA CREATION     clotted off  . IVC FILTER INSERTION    . PORTACATH PLACEMENT      OB History    No data available       Home Medications    Prior to Admission medications   Medication Sig Start Date End Date Taking? Authorizing Provider  acetaminophen (TYLENOL) 325 MG tablet Take 650 mg by mouth every 6 (six) hours as needed for mild pain.   Yes [provider]  aspirin 325 MG EC tablet Take 325 mg daily by mouth.   Yes [provider]  calcitRIOL (ROCALTROL) 0.5 MCG capsule Take 0.5 mcg by mouth daily.   Yes [provider]  hydrOXYzine (ATARAX/VISTARIL) 25 MG tablet Take 1 tablet (25 mg total) by mouth every 8 (eight) hours as needed for itching. 04/12/17  Yes Sheikh, Omair Latif, DO  insulin aspart protamine- aspart (NOVOLOG MIX 70/30) (70-30) 100  UNIT/ML injection Inject 0-6 Units 2 (two) times daily with a meal into the skin. Sliding scale per family   Yes [provider]  ranitidine (ZANTAC) 150 MG tablet Take 150 mg by mouth 2 (two) times daily.   Yes [provider]  triamcinolone cream (KENALOG) 0.1 % Apply 1 application topically 2 (two) times daily. Use on her hand   Yes [provider]  warfarin (COUMADIN) 5 MG tablet Take 1 tablet (5 mg total) by mouth daily at 6 PM. 04/12/17  Yes Sheikh, Omair Latif, DO  Amino Acids-Protein Hydrolys (FEEDING SUPPLEMENT, PRO-STAT  SUGAR FREE 64,) LIQD Take 30 mLs by mouth 2 (two) times daily. Patient not taking: Reported on 04/25/2017 04/12/17   Raiford Noble Latif, DO  aspirin EC 81 MG EC tablet Take 1 tablet (81 mg total) by mouth daily. Patient not taking: Reported on 04/25/2017 04/13/17   Raiford Noble Latif, DO  camphor-menthol [facility]) lotion Apply 1 application topically every 8 (eight) hours as needed for itching. Patient not taking: Reported on 04/25/2017 04/12/17   Raiford Noble Latif, DO  carvedilol (COREG) 6.25 MG tablet Take 1 tablet (6.25 mg total) by mouth 2 (two) times daily. 03/04/17 03/04/18  Hosie Poisson, MD  cyclobenzaprine (FLEXERIL) 10 MG tablet Take 10 mg by mouth as needed for muscle spasms.    [provider]  docusate sodium (COLACE) 100 MG capsule Take 1 capsule (100 mg total) by mouth 2 (two) times daily. 04/12/17   Sheikh, Omair Latif, DO  insulin aspart (NOVOLOG) 100 UNIT/ML injection Inject 0-9 Units into the skin 3 (three) times daily with meals. Patient not taking: Reported on 04/25/2017 03/22/17   Debbe Odea, MD  insulin glargine (LANTUS) 100 UNIT/ML injection Inject 0.06 mLs (6 Units total) into the skin daily. 03/22/17   Debbe Odea, MD  ipratropium-albuterol (DUONEB) 0.5-2.5 (3) MG/3ML SOLN Take 3 mLs by nebulization every 4 (four) hours as needed. Patient not taking: Reported on 04/25/2017 04/12/17   Raiford Noble Latif, DO  linaclotide [facility]) 145 MCG CAPS capsule Take 145 mcg by mouth daily before breakfast.    [provider]  midodrine (PROAMATINE) 10 MG tablet Take 1 tablet (10 mg total) by mouth every Monday, Wednesday, and Friday with hemodialysis. 04/14/17   Raiford Noble Latif, DO  multivitamin (RENA-VIT) TABS tablet Take 1 tablet by mouth at bedtime. 04/12/17   Raiford Noble Latif, DO  Nutritional Supplements (FEEDING SUPPLEMENT, NEPRO CARB STEADY,) LIQD Take 237 mLs by mouth 3 (three) times daily as needed (Supplement). Patient not taking: Reported on 04/25/2017  04/12/17   Raiford Noble Latif, DO  omeprazole (PRILOSEC) 20 MG capsule Take 20 mg by mouth daily.    [provider]  simvastatin (ZOCOR) 40 MG tablet Take 1 tablet (40 mg total) by mouth daily. 03/05/17   Hosie Poisson, MD    Family History Family History  Problem Relation Age of Onset  . Breast cancer Mother   . Diabetes Sister   . Diabetes Brother   . CAD Other   . Stroke Neg Hx     Social History Social History   Tobacco Use  . Smoking status: Never Smoker  . Smokeless tobacco: Never Used  Substance Use Topics  . Alcohol use: No  . Drug use: No     Allergies   Patient has no known allergies.   Review of Systems Review of Systems  Constitutional: Negative for chills and fever.  HENT: Negative for congestion and rhinorrhea.   Eyes:  Negative for redness and visual disturbance.  Respiratory: Negative for shortness of breath and wheezing.   Cardiovascular: Negative for chest pain and palpitations.  Gastrointestinal: Positive for abdominal pain, nausea and vomiting.  Genitourinary: Negative for dysuria and urgency.  Musculoskeletal: Negative for arthralgias and myalgias.  Skin: Negative for pallor and wound.  Neurological: Positive for weakness (generalized). Negative for dizziness and headaches.     Physical Exam Updated Vital Signs BP (!) 112/51   Pulse 88   Temp 98.1 F (36.7 C) (Oral)   Resp 20   Ht 5\' 1"  (1.549 m)   Wt 102.1 kg (225 lb)   SpO2 94%   BMI 42.51 kg/m   Physical Exam  Constitutional: She is oriented to person, place, and time. She appears well-developed and well-nourished. No distress.  HENT:  Head: Normocephalic.  Frontal bruising on the bridge of the nose and to bilateral eyes.  Eyes: EOM are normal. Pupils are equal, round, and reactive to light.  Neck: Normal range of motion. Neck supple.  Cardiovascular: Normal rate and regular rhythm. Exam reveals no gallop and no friction rub.  No murmur heard. Pulmonary/Chest: Effort  normal. She has no wheezes. She has no rales.  Abdominal: Soft. Bowel sounds are normal. She exhibits no distension. There is tenderness in the epigastric area.  Musculoskeletal: She exhibits no edema or tenderness.  Pain to the right clavicle  Neurological: She is alert and oriented to person, place, and time.  Generalized weakness  Skin: Skin is warm and dry. She is not diaphoretic.  Psychiatric: She has a normal mood and affect. Her behavior is normal.  Nursing note and vitals reviewed.    ED Treatments / Results  Labs (all labs ordered are listed, but only abnormal results are displayed) Labs Reviewed  COMPREHENSIVE METABOLIC PANEL - Abnormal; Notable for the following components:      Result Value   Chloride 97 (*)    Glucose, Bld 228 (*)    Creatinine, Ser 3.99 (*)    Calcium 7.8 (*)    Total Protein 6.4 (*)    Albumin 3.0 (*)    ALT 11 (*)    GFR calc non Af Amer 10 (*)    GFR calc Af Amer 12 (*)    Anion gap 17 (*)    All other components within normal limits  CBC - Abnormal; Notable for the following components:   WBC 19.3 (*)    RBC 3.72 (*)    Hemoglobin 9.3 (*)    HCT 30.1 (*)    MCH 25.0 (*)    RDW 16.2 (*)    All other components within normal limits  LIPASE, BLOOD  URINALYSIS, ROUTINE W REFLEX MICROSCOPIC    EKG  EKG Interpretation None       Radiology Ct Abdomen Pelvis Wo Contrast  Result Date: 04/25/2017 CLINICAL DATA:  Acute mid abdominal pain. EXAM: CT ABDOMEN AND PELVIS WITHOUT CONTRAST TECHNIQUE: Multidetector CT imaging of the abdomen and pelvis was performed following the standard protocol without IV contrast. COMPARISON:  None. FINDINGS: Lower chest: No acute abnormality. Hepatobiliary: No focal liver abnormality is seen. Status post cholecystectomy. No biliary dilatation. Pancreas: Unremarkable. No pancreatic ductal dilatation or surrounding inflammatory changes. Spleen: Normal in size without focal abnormality. Adrenals/Urinary Tract:  Adrenal glands appear normal. Bilateral renal atrophy is noted consistent with history of end-stage renal disease. No hydronephrosis or renal obstruction is noted. No renal or ureteral calculi are noted. Urinary bladder is unremarkable. Stomach/Bowel: Diffuse wall  thickening of the stomach is noted with mild inflammatory changes concerning for gastritis. No bowel dilatation or obstruction is noted. The appendix is not visualized. Vascular/Lymphatic: Aortic atherosclerosis. No enlarged abdominal or pelvic lymph nodes. Reproductive: Status post hysterectomy. No adnexal masses. Other: No abdominal wall hernia or abnormality. No abdominopelvic ascites. Musculoskeletal: Findings consistent with renal osteodystrophy. IMPRESSION: Diffuse gastric wall thickening is noted with mild surrounding inflammatory changes concerning for gastritis or possibly lymphomatous infiltration. Endoscopy is recommended for further evaluation. Bilateral renal atrophy is noted consistent with end-stage renal disease. Aortic atherosclerosis. Electronically Signed   By: Marijo Conception, M.D.   On: 04/25/2017 14:58   Dg Chest Port 1 View  Result Date: 04/25/2017 CLINICAL DATA:  Onset of mid abdominal pain. Fell from a commode 3 days ago. History of dialysis dependent renal failure, chronic CHF, diabetes. EXAM: PORTABLE CHEST 1 VIEW COMPARISON:  Chest x-ray of April 21, 2017 FINDINGS: The lungs are adequately inflated. There is no focal infiltrate. There is no pleural effusion. The cardiac silhouette is enlarged. The pulmonary vascularity is normal. There is calcification in the wall of the aortic arch. There is a catheter present via the right subclavian approach whose tip projects over the midportion of the SVC. The bony thorax is unremarkable. IMPRESSION: Stable cardiomegaly without pulmonary vascular congestion or pulmonary edema. No acute pneumonia nor other acute cardiopulmonary abnormality. Thoracic aortic atherosclerosis.  Electronically Signed   By: David  Martinique M.D.   On: 04/25/2017 14:12    Procedures Procedures (including critical care time) Procedure note: Ultrasound Guided Peripheral IV Ultrasound guided peripheral 1.88 inch angiocath IV placement performed by me. Indications: Nursing unable to place IV. Details: The antecubital fossa and upper arm were evaluated with a multifrequency linear probe. Patent brachial veins were noted. 1 attempt was made to cannulate a vein under realtime US guidance with successful cannulation of the vein and catheter placement. There is return of non-pulsatile dark red blood. The patient tolerated the procedure well without complications. Images archived electronically.  CPT codes: [phone] and [phone]  Medications Ordered in ED Medications  alum & mag hydroxide-simeth (MAALOX/MYLANTA) 200-200-20 MG/5ML suspension 15 mL (not administered)  HYDROmorphone (DILAUDID) injection 0.25 mg (0.25 mg Intravenous Given 04/25/17 1444)  ondansetron (ZOFRAN) injection 4 mg (4 mg Intravenous Given 04/25/17 1527)     Initial Impression / Assessment and Plan / ED Course  I have reviewed the triage vital signs and the nursing notes.  Pertinent labs & imaging results that were available during my care of the patient were reviewed by me and considered in my medical decision making (see chart for details).     76 yo F with a chief complaint of epigastric abdominal pain with fever and generalized weakness.  Chest x-ray without pneumonia.  CT scan with gastritis.  The patient has been able to tolerate very minimal fluids here.  White count is 20,000.  No urine yet obtained as the patient is very limited but after starting dialysis.  With fever and weakness and concern the patient may have a urinary tract infection.  Patient is having difficulty at home with the family.  Discussed with the hospitalist.  The patients results and plan were reviewed and discussed.   Any x-rays performed were  independently reviewed by myself.   Differential diagnosis were considered with the presenting HPI.  Medications  alum & mag hydroxide-simeth (MAALOX/MYLANTA) 200-200-20 MG/5ML suspension 15 mL (not administered)  HYDROmorphone (DILAUDID) injection 0.25 mg (0.25 mg Intravenous Given 04/25/17 1444)  ondansetron [facility]) injection 4 mg (4 mg Intravenous Given 04/25/17 1527)    Vitals:   04/25/17 1131 04/25/17 1134 04/25/17 1400 04/25/17 1415  BP:  112/86 (!) 75/58 (!) 112/51  Pulse:   87 88  Resp:   16 20  Temp:      TempSrc:      SpO2:   95% 94%  Weight: 102.1 kg (225 lb)     Height: 5\' 1"  (1.549 m)       Final diagnoses:  Acute gastritis without hemorrhage, unspecified gastritis type  Leukocytosis, unspecified type  Weakness  Fever in adult    Admission/ observation were discussed with the admitting physician, patient and/or family and they are comfortable with the plan.    Final Clinical Impressions(s) / ED Diagnoses   Final diagnoses:  Acute gastritis without hemorrhage, unspecified gastritis type  Leukocytosis, unspecified type  Weakness  Fever in adult    ED Discharge Orders    None       Deno Etienne, DO 04/25/17 1642

## 2017-04-25 NOTE — ED Notes (Signed)
PureWick placed on pt. 

## 2017-04-25 NOTE — ED Notes (Signed)
Patient transported to CT 
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
## 2017-04-25 NOTE — Progress Notes (Addendum)
[place] for warfarin Indication: DVT hx   Assessment: 64 yof with history of ESRD on HD, DVT, Protein C deficiency on warfarin PTA. Pharmacy consulted to dose while inpatient. Noted multiple recent falls noted - CT head negative for bleed. Oral intake has been variable recently per MD note. Hg 9.3, plt wnl. No bleed documented.  PTA warfarin dose: 5mg  daily (last dose 04/24/17 PTA)  Goal of Therapy:  INR 2-3 Monitor platelets by anticoagulation protocol: Yes   Plan:  F/u INR tonight for warfarin dose  Daily INR Monitor CBC, s/sx bleeding  Elicia Lamp, PharmD, BCPS Clinical Pharmacist 04/25/2017 6:28 PM   ADDENDUM: INR at goal, 2.38.  Will continue home Coumadin dose of 5mg  daily starting now. Wynona Neat, PharmD, BCPS 04/26/2017 1:16 AM

## 2017-04-26 DIAGNOSIS — W19XXXA Unspecified fall, initial encounter: Secondary | ICD-10-CM

## 2017-04-26 LAB — CBC WITH DIFFERENTIAL/PLATELET
Basophils Absolute: 0 10*3/uL (ref 0.0–0.1)
Basophils Relative: 0 %
Eosinophils Absolute: 0.2 10*3/uL (ref 0.0–0.7)
Eosinophils Relative: 1 %
HCT: 28.8 % — ABNORMAL LOW (ref 36.0–46.0)
Hemoglobin: 9 g/dL — ABNORMAL LOW (ref 12.0–15.0)
Lymphocytes Relative: 13 %
Lymphs Abs: 2.3 10*3/uL (ref 0.7–4.0)
MCH: 25.4 pg — ABNORMAL LOW (ref 26.0–34.0)
MCHC: 31.3 g/dL (ref 30.0–36.0)
MCV: 81.1 fL (ref 78.0–100.0)
Monocytes Absolute: 1 10*3/uL (ref 0.1–1.0)
Monocytes Relative: 5 %
Neutro Abs: 14.8 10*3/uL — ABNORMAL HIGH (ref 1.7–7.7)
Neutrophils Relative %: 81 %
Platelets: 255 10*3/uL (ref 150–400)
RBC: 3.55 MIL/uL — ABNORMAL LOW (ref 3.87–5.11)
RDW: 16.7 % — ABNORMAL HIGH (ref 11.5–15.5)
WBC: 18.3 10*3/uL — ABNORMAL HIGH (ref 4.0–10.5)

## 2017-04-26 LAB — COMPREHENSIVE METABOLIC PANEL
ALT: 9 U/L — ABNORMAL LOW (ref 14–54)
AST: 19 U/L (ref 15–41)
Albumin: 2.7 g/dL — ABNORMAL LOW (ref 3.5–5.0)
Alkaline Phosphatase: 72 U/L (ref 38–126)
Anion gap: 11 (ref 5–15)
BUN: 21 mg/dL — ABNORMAL HIGH (ref 6–20)
CO2: 30 mmol/L (ref 22–32)
Calcium: 8 mg/dL — ABNORMAL LOW (ref 8.9–10.3)
Chloride: 98 mmol/L — ABNORMAL LOW (ref 101–111)
Creatinine, Ser: 4.65 mg/dL — ABNORMAL HIGH (ref 0.44–1.00)
GFR calc Af Amer: 10 mL/min — ABNORMAL LOW (ref >60)
GFR calc non Af Amer: 8 mL/min — ABNORMAL LOW (ref >60)
Glucose, Bld: 173 mg/dL — ABNORMAL HIGH (ref 65–99)
Potassium: 3.8 mmol/L (ref 3.5–5.1)
Sodium: 139 mmol/L (ref 135–145)
Total Bilirubin: 0.9 mg/dL (ref 0.3–1.2)
Total Protein: 6.2 g/dL — ABNORMAL LOW (ref 6.5–8.1)

## 2017-04-26 LAB — PREALBUMIN: Prealbumin: 11.6 mg/dL — ABNORMAL LOW (ref 18–38)

## 2017-04-26 LAB — SEDIMENTATION RATE: Sed Rate: 44 mm/hr — ABNORMAL HIGH (ref 0–22)

## 2017-04-26 LAB — C-REACTIVE PROTEIN: CRP: 15.7 mg/dL — ABNORMAL HIGH (ref <1.0)

## 2017-04-26 LAB — PROTIME-INR
INR: 2.38
INR: 2.26
Prothrombin Time: 25.8 seconds — ABNORMAL HIGH (ref 11.4–15.2)
Prothrombin Time: 24.8 seconds — ABNORMAL HIGH (ref 11.4–15.2)

## 2017-04-26 LAB — LACTIC ACID, PLASMA
Lactic Acid, Venous: 0.8 mmol/L (ref 0.5–1.9)
Lactic Acid, Venous: 1.1 mmol/L (ref 0.5–1.9)

## 2017-04-26 LAB — MRSA PCR SCREENING: MRSA by PCR: NEGATIVE

## 2017-04-26 LAB — MAGNESIUM: Magnesium: 1.8 mg/dL (ref 1.7–2.4)

## 2017-04-26 LAB — PHOSPHORUS: Phosphorus: 4.3 mg/dL (ref 2.5–4.6)

## 2017-04-26 LAB — GLUCOSE, CAPILLARY: Glucose-Capillary: 160 mg/dL — ABNORMAL HIGH (ref 65–99)

## 2017-04-26 MED ORDER — LIDOCAINE HCL (PF) 1 % IJ SOLN: 5.0000 mL | INTRAMUSCULAR | Status: DC | PRN

## 2017-04-26 MED ORDER — PENTAFLUOROPROP-TETRAFLUOROETH EX AERO: 1.0000 "application " | INHALATION_SPRAY | CUTANEOUS | Status: DC | PRN

## 2017-04-26 MED ORDER — HEPARIN SODIUM (PORCINE) 1000 UNIT/ML DIALYSIS: 3000.0000 [IU] | Freq: Once | INTRAMUSCULAR | Status: DC

## 2017-04-26 MED ORDER — HEPARIN SODIUM (PORCINE) 1000 UNIT/ML DIALYSIS: 1000.0000 [IU] | INTRAMUSCULAR | Status: DC | PRN

## 2017-04-26 MED ORDER — FAMOTIDINE 20 MG PO TABS
20.0000 mg | ORAL_TABLET | Freq: Every day | ORAL | Status: DC
  Administered 2017-04-26 – 2017-04-28 (×3): 20 mg via ORAL
  Filled 2017-04-26 (×3): qty 1

## 2017-04-26 MED ORDER — SODIUM CHLORIDE 0.9 % IV SOLN: 100.0000 mL | INTRAVENOUS | Status: DC | PRN

## 2017-04-26 MED ORDER — LIDOCAINE-PRILOCAINE 2.5-2.5 % EX CREA: 1.0000 "application " | TOPICAL_CREAM | CUTANEOUS | Status: DC | PRN

## 2017-04-26 MED ORDER — WARFARIN SODIUM 5 MG PO TABS
5.0000 mg | ORAL_TABLET | Freq: Every day | ORAL | Status: DC
  Administered 2017-04-26 – 2017-04-27 (×3): 5 mg via ORAL
  Filled 2017-04-26 (×3): qty 1

## 2017-04-26 MED ORDER — MIDODRINE HCL 5 MG PO TABS
ORAL_TABLET | ORAL | Status: AC
  Filled 2017-04-26: qty 2

## 2017-04-26 MED ORDER — TRAMADOL HCL 50 MG PO TABS
50.0000 mg | ORAL_TABLET | Freq: Four times a day (QID) | ORAL | Status: DC | PRN
Start: 2017-04-26 — End: 2017-04-27
  Administered 2017-04-26: 50 mg via ORAL
  Filled 2017-04-26: qty 1

## 2017-04-26 MED ORDER — CEFTRIAXONE SODIUM 1 G IJ SOLR
1.0000 g | Freq: Every day | INTRAMUSCULAR | Status: DC
  Administered 2017-04-26 – 2017-04-28 (×3): 1 g via INTRAVENOUS
  Filled 2017-04-26 (×3): qty 10

## 2017-04-26 MED ORDER — METOCLOPRAMIDE HCL 5 MG/ML IJ SOLN: 5.0000 mg | Freq: Four times a day (QID) | INTRAMUSCULAR | Status: DC

## 2017-04-26 MED ORDER — ALTEPLASE 2 MG IJ SOLR: 2.0000 mg | Freq: Once | INTRAMUSCULAR | Status: DC | PRN

## 2017-04-26 MED ORDER — SODIUM CHLORIDE 0.9% FLUSH
10.0000 mL | INTRAVENOUS | Status: DC | PRN
  Administered 2017-04-28: 10 mL
  Filled 2017-04-26: qty 40

## 2017-04-26 MED ORDER — WARFARIN - PHARMACIST DOSING INPATIENT: Freq: Every day | Status: DC

## 2017-04-26 NOTE — Progress Notes (Signed)
PT Cancellation Note  Patient Details Name: Ashlee Miles MRN: [id number] DOB: 07-22-1940   Cancelled Treatment:    Reason Eval/Treat Not Completed: Patient at procedure or test/unavailable Pt is currently off the floor. PT will follow back later for evaluation. Thanks,  Dani Gobble. Migdalia Dk PT, DPT Acute Rehabilitation  [phone] Pager [phone]  [place] 04/26/2017, 8:56 AM

## 2017-04-26 NOTE — Progress Notes (Signed)
[facility] Progress Note  Patient: Ashlee Miles GQQ:[id number]   PCP: Darlina Rumpf, MD DOB: February 21, 1941   DOA: 04/25/2017   DOS: 04/26/2017   Date of Service: the patient was seen and examined on 04/26/2017  Subjective: Continues to have abdominal pain also has neck pain as well as back pain.  This pain she has reported as chronic with acute worsening since her fall.  No vision changes no painful eye movement.  No nausea no vomiting.  Tolerating oral diet.  Family reports poor appetite as well as poor oral intake over last 2 weeks.  Brief hospital course: Pt. with PMH of ESRD on HD, protein C deficiency on anticoagulation, pituitary adenoma, hypertension, chronic diastolic CHF, type II DM; admitted on 04/25/2017, presented with complaint of abdominal pain, was found to have UTI, chronic gastritis. Currently further plan is continue current treatment.  Assessment and Plan: 1.  Epigastric abdominal pain. CT scan shows diffuse gastric wall thickening consistent with gastritis versus lymphomatous infiltration. EGD recommended. ESR and CRP elevated, H pylori pending.  Lactic acid normal. Continue Carafate. Continue PPI as well as H2 blocker. Discussed with [facility], patient is currently on anticoagulation with warfarin and patient does not have any reporting history of active bleeding, no hematemesis, no melena. Currently performing an inpatient EGD is not recommended and would recommend outpatient follow-up for now. Monitor daily H&H.  2.  Fall, near syncope. Subconjunctival hemorrhage on the left eye. Presented with a fall, PT OT consult pending. CT head unremarkable, MRI brain MRI C-spine currently pending. Possible UTI cannot be ruled out and therefore patient will be treated with IV ceftriaxone.  3.  ESRD. On HD Monday Wednesday Friday. Continue HD per nephrology appreciate input.  4.  Type 2 diabetes mellitus. On low-dose Lantus right now along with sliding scale insulin. We  will monitor for now.  5.  History of DVT. 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 On warfarin.  Monitor for now.  6.  Chronic diastolic CHF. EF preserved. Continue hemodialysis, continue Coreg.  7.  Constipation. Continue bowel regimen.  Diet: Renal diet DVT Prophylaxis on therapeutic anticoagulation.  Advance goals of care discussion: DNR DNI  Family Communication: family was present at bedside, at the time of interview. The pt provided permission to discuss medical plan with the family. Opportunity was given to ask question and all questions were answered satisfactorily.   Disposition:  Discharge to be determined.   Consultants: none Procedures: HD  Antibiotics: Anti-infectives (From admission, onward)   Start     Dose/Rate Route Frequency Ordered Stop   04/26/17 1430  cefTRIAXone (ROCEPHIN) 1 g in dextrose 5 % 50 mL IVPB     1 g 100 mL/hr over 30 Minutes Intravenous Daily 04/26/17 1419         Objective: Physical Exam: Vitals:   04/26/17 1100 04/26/17 1130 04/26/17 1420 04/26/17 1820  BP: (!) 105/50 (!) 101/48 (!) 115/52 120/60  Pulse: 86 87 88 80  Resp: 16 16 18 18   Temp:  98.2 F (36.8 C) 98 F (36.7 C) 98.7 F (37.1 C)  TempSrc:  Oral Oral Oral  SpO2:  98% 98% 98%  Weight:  101.8 kg (224 lb 6.9 oz)    Height:        Intake/Output Summary (Last 24 hours) at 04/26/2017 1827 Last data filed at 04/26/2017 1130 Gross per 24 hour  Intake -  Output 1500 ml  Net -1500 ml   Filed Weights   04/25/17  2032 04/26/17 0710 04/26/17 1130  Weight: 106.5 kg (234 lb 12.6 oz) 103.3 kg (227 lb 11.8 oz) 101.8 kg (224 lb 6.9 oz)   General: Alert, Awake and Oriented to Time, Place and Person. Appear in mild distress, affect appropriate Eyes: PERRL, left eye subconjunctival hemorrhage, old bruising on nose as well as underneath bilateral eye ENT: Oral Mucosa clear moist Neck: no JVD, no Abnormal Mass Or lumps Cardiovascular: S1 and S2 Present, no Murmur,  Peripheral Pulses Present Respiratory: normal respiratory effort, Bilateral Air entry equal and Decreased, no use of accessory muscle, Clear to Auscultation, no Crackles, no wheezes Abdomen: Bowel Sound present, Soft and epigastric tenderness, no hernia Skin: no redness, no Rash, no induration Extremities: bilateral Pedal edema, no calf tenderness Neurologic: Grossly no focal neuro deficit. Bilaterally Equal motor strength  Data Reviewed: CBC: Recent Labs  Lab 04/21/17 2057 04/25/17 1455 04/26/17 0348  WBC 9.0 19.3* 18.3*  NEUTROABS 6.2  --  14.8*  HGB 10.4* 9.3* 9.0*  HCT 32.2* 30.1* 28.8*  MCV 80.3 80.9 81.1  PLT 148* 285 710   Basic Metabolic Panel: Recent Labs  Lab 04/21/17 2057 04/25/17 1145 04/26/17 0020 04/26/17 0348  NA 132* 138  --  139  K 4.7 4.3  --  3.8  CL 96* 97*  --  98*  CO2 26 24  --  30  GLUCOSE 233* 228*  --  173*  BUN 19 18  --  21*  CREATININE 3.09* 3.99*  --  4.65*  CALCIUM 7.9* 7.8*  --  8.0*  MG  --   --  1.8  --   PHOS  --   --  4.3  --     Liver Function Tests: Recent Labs  Lab 04/21/17 2057 04/25/17 1145 04/26/17 0348  AST 25 26 19   ALT 11* 11* 9*  ALKPHOS 67 74 72  BILITOT 0.4 0.9 0.9  PROT 6.7 6.4* 6.2*  ALBUMIN 3.2* 3.0* 2.7*   Recent Labs  Lab 04/25/17 1145  LIPASE 17   No results for input(s): AMMONIA in the last 168 hours. Coagulation Profile: Recent Labs  Lab 04/26/17 0020 04/26/17 0348  INR 2.38 2.26   Cardiac Enzymes: No results for input(s): CKTOTAL, CKMB, CKMBINDEX, TROPONINI in the last 168 hours. BNP (last 3 results) No results for input(s): PROBNP in the last 8760 hours. CBG: Recent Labs  Lab 04/21/17 1926 04/22/17 0031 04/25/17 2100 04/26/17 0614  GLUCAP 50* 229* 201* 160*   Studies: Ct Head Wo Contrast  Result Date: 04/25/2017 CLINICAL DATA:  Patient fell off toilet today and has blood in left eye and knot on the bridge of the nose. EXAM: CT HEAD WITHOUT CONTRAST TECHNIQUE: Contiguous axial  images were obtained from the base of the skull through the vertex without intravenous contrast. COMPARISON:  None. FINDINGS: Brain: Chronic mild small vessel ischemic disease. Mild superficial and central atrophy. Idiopathic basal ganglial calcifications are re- demonstrated. No acute intracranial hemorrhage, large vascular territory infarct, edema or midline shift. No effacement of the basal cisterns Vascular: No hyperdense vessels. Moderate atherosclerosis of the carotid siphons. Skull: Negative for acute fracture or focal osseous lesions. Sinuses/Orbits: Intact orbits and globes without retrobulbar hemorrhage. No acute sinus disease. Other: Soft tissue contusion of the nasal bridge. IMPRESSION: 1. Soft tissue contusion over the nasal bridge without nasal bone fracture. 2. Chronic microvascular ischemic disease. No acute intracranial abnormality. 3. Intact orbits and globes without retrobulbar hemorrhage. Electronically Signed   By: Meredith Leeds.D.  On: 04/25/2017 19:59    Scheduled Meds: . aspirin  325 mg Oral Daily  . calcitRIOL  0.5 mcg Oral Daily  . carvedilol  6.25 mg Oral BID  . famotidine  20 mg Oral Daily  . insulin aspart  0-5 Units Subcutaneous QHS  . insulin aspart  0-9 Units Subcutaneous TID WC  . insulin glargine  3 Units Subcutaneous Daily  . linaclotide  145 mcg Oral QAC breakfast  . midodrine  10 mg Oral Q M,W,F-HD  . multivitamin  1 tablet Oral QHS  . pantoprazole  80 mg Oral Daily  . simvastatin  40 mg Oral Daily  . sodium chloride flush  3 mL Intravenous Q12H  . sucralfate  1 g Oral TID WC & HS  . warfarin  5 mg Oral q1800  . Warfarin - Pharmacist Dosing Inpatient   Does not apply q1800   Continuous Infusions: . sodium chloride    . cefTRIAXone (ROCEPHIN)  IV Stopped (04/26/17 1608)   PRN Meds: sodium chloride, hydrOXYzine, ipratropium-albuterol, ondansetron **OR** ondansetron (ZOFRAN) IV, sodium chloride flush, sodium chloride flush, traMADol  Time spent: 35  minutes  Author: Berle Mull, MD [facility] Pager: [phone] 04/26/2017 6:27 PM  If 7PM-7AM, please contact night-coverage at [email or web address], password [facility]

## 2017-04-26 NOTE — Progress Notes (Signed)
OT Cancellation    04/26/17 0800  OT Visit Information  Last OT Received On 04/26/17  Reason Eval/Treat Not Completed Patient at procedure or test/ unavailable (Pt off the floor. Will return as schedule allows. Thank you.)   [place], OTR/L Acute Rehab Pager: [phone] Office: [phone]

## 2017-04-26 NOTE — Progress Notes (Signed)
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 Pharmacy consulted to dose while inpatient. Noted multiple recent falls noted - CT head negative for bleed. Oral intake has been variable recently per MD note. Hg 9.3, plt wnl. No bleed documented.  PTA warfarin dose: 5mg  daily (last dose 04/24/17 PTA)  INR therapeutic today  Goal of Therapy:  INR 2-3 Monitor platelets by anticoagulation protocol: Yes   Plan:  Continue warfarin 5 mg po daily Daily INR  Thank you Anette Guarneri, PharmD [phone] 04/26/2017 9:46 AM

## 2017-04-26 NOTE — Procedures (Signed)
Patient seen on Hemodialysis. QB 400, UF goal 2L Treatment adjusted as needed.  Elmarie Shiley MD [facility]. Office # [phone] Pager # [phone] 9:54 AM

## 2017-04-26 NOTE — Evaluation (Signed)
Physical Therapy Evaluation Patient Details Name: Ashlee Miles MRN: [id number] DOB: [birth year] Today's Date: 04/26/2017   History of Present Illness  Ashlee Miles is a 76 y.o. female admitted from home with increased confusion and weakness over the past 2 weeks. Pt brought to ED a week ago after mechanical fall resulting in fractured nasal bone. Prior medical history significant of congestive heart failure, diabetes, ESRD on dialysis, pituitary adenoma, hypertension, protein C deficiency. CT of head showed no acute intracranial infarct. CT of abdomen revealed Diffuse gastric wall thickening is noted with mild surrounding.  Clinical Impression  Pt admitted with above diagnosis. Pt currently with functional limitations due to the deficits listed below (see PT Problem List). Pt is limited in her mobility by generalized weakness and decreased balance. Pt currently modAx2 for bed mobility, minAx2 for transfers and modAx2 for ambulation of 3 feet to recliner using RW. Pt will benefit from skilled PT to increase their independence and safety with mobility to allow discharge to the venue listed below.       Follow Up Recommendations SNF    Equipment Recommendations  None recommended by PT    Recommendations for Other Services       Precautions / Restrictions Precautions Precautions: Fall Restrictions Weight Bearing Restrictions: No      Mobility  Bed Mobility Overal bed mobility: Needs Assistance Bed Mobility: Supine to Sit     Supine to sit: Mod assist;+2 for physical assistance     General bed mobility comments: modAx2 for trunk to upright, LE off bed and pad scoot of hips to EoB, vc for use of handrail to aid in coming to upright  Transfers Overall transfer level: Needs assistance Equipment used: Rolling walker (2 wheeled)   Sit to Stand: Min assist;+2 physical assistance         General transfer comment: minAx2 for power up and stabilizing with RW, vc for hand and feet  placement  Ambulation/Gait Ambulation/Gait assistance: Mod assist;+2 physical assistance Ambulation Distance (Feet): 3 Feet Assistive device: Rolling walker (2 wheeled) Gait Pattern/deviations: Step-to pattern;Shuffle;Decreased step length - right;Decreased step length - left;Trunk flexed Gait velocity: slowed Gait velocity interpretation: Below normal speed for age/gender General Gait Details: modAx2 for steadying with gait, only could tolerate 8 steps to recliner, vc for upright posture and use of UE for support     Balance   Sitting-balance support: Feet supported;No upper extremity supported Sitting balance-[name]: Fair     Standing balance support: Bilateral upper extremity supported;During functional activity Standing balance-[name]: Poor Standing balance comment: heavy reliance on RW                             Pertinent Vitals/Pain Pain Assessment: 0-10 Pain Score: 8  Pain Location: Center of chest Pain Descriptors / Indicators: Grimacing;Sore;Guarding Pain Intervention(s): Limited activity within patient's tolerance;Monitored during session    Home Living Family/patient expects to be discharged to:: Skilled nursing facility Living Arrangements: Children Available Help at Discharge: Family;Personal care attendant(PCA M-F 3.5 hours) Type of Home: House Home Access: Stairs to enter Entrance Stairs-Rails: Psychiatric nurse of Steps: 3   Home Equipment: Toilet riser;Walker - 4 wheels;Shower seat      Prior Function Level of Independence: Needs assistance   Gait / Transfers Assistance Needed: house hold mobility with RW and community with Rollator  ADL's / Homemaking Assistance Needed: bathing and dressing by PCA        Hand Dominance  Dominant Hand: Right    Extremity/Trunk Assessment   Upper Extremity Assessment Upper Extremity Assessment: Defer to OT evaluation    Lower Extremity Assessment Lower Extremity  Assessment: Generalized weakness       Communication   Communication: No difficulties  Cognition Arousal/Alertness: Awake/alert Behavior During Therapy: WFL for tasks assessed/performed Overall Cognitive Status: Within Functional Limits for tasks assessed                                         Assessment/Plan    PT Assessment Patient needs continued PT services  PT Problem List Decreased strength;Decreased range of motion;Decreased activity tolerance;Decreased balance;Decreased mobility;Pain       PT Treatment Interventions DME instruction;Gait training;Functional mobility training;Stair training;Therapeutic activities;Therapeutic exercise;Balance training;Patient/family education    PT Goals (Current goals can be found in the Care Plan section)  Acute Rehab PT Goals Patient Stated Goal: none stated PT Goal Formulation: With patient Time For Goal Achievement: 05/10/17 Potential to Achieve Goals: Good    Frequency Min 2X/week   Barriers to discharge Decreased caregiver support      Co-evaluation PT/OT/SLP Co-Evaluation/Treatment: Yes Reason for Co-Treatment: For patient/therapist safety;Necessary to address cognition/behavior during functional activity PT goals addressed during session: Mobility/safety with mobility;Balance;Proper use of DME OT goals addressed during session: ADL's and self-care;Proper use of Adaptive equipment and DME       AM-PAC PT "6 Clicks" Daily Activity  Outcome Measure Difficulty turning over in bed (including adjusting bedclothes, sheets and blankets)?: Unable Difficulty moving from lying on back to sitting on the side of the bed? : Unable Difficulty sitting down on and standing up from a chair with arms (e.g., wheelchair, bedside commode, etc,.)?: Unable Help needed moving to and from a bed to chair (including a wheelchair)?: A Lot Help needed walking in hospital room?: Total Help needed climbing 3-5 steps with a railing? :  Total 6 Click Score: 7    End of Session Equipment Utilized During Treatment: Gait belt Activity Tolerance: Patient limited by fatigue Patient left: in chair;with call bell/phone within reach;with chair alarm set Nurse Communication: Mobility status PT Visit Diagnosis: Unsteadiness on feet (R26.81);Other abnormalities of gait and mobility (R26.89);Muscle weakness (generalized) (M62.81);History of falling (Z91.81);Difficulty in walking, not elsewhere classified (R26.2);Pain    Time: 1829-9371 PT Time Calculation (min) (ACUTE ONLY): 27 min   Charges:   PT Evaluation $PT Eval Moderate Complexity: 1 Mod     PT G Codes:        Kenzo Ozment B. Migdalia Dk PT, DPT Acute Rehabilitation  [phone] Pager [phone]    [place] 04/26/2017, 4:56 PM
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
## 2017-04-26 NOTE — Evaluation (Signed)
Occupational Therapy Evaluation Patient Details Name: Ashlee Miles MRN: [id number] DOB: [birth year] Today's Date: 04/26/2017    History of Present Illness Ashlee Miles is a 76 y.o. female admitted from home with increased confusion and weakness over the past 2 weeks. Pt brought to ED a week ago after mechanical fall resulting in fractured nasal bone. Prior medical history significant of congestive heart failure, diabetes, ESRD on dialysis, pituitary adenoma, hypertension, protein C deficiency. CT of head showed no acute intracranial infarct. CT of abdomen revealed Diffuse gastric wall thickening is noted with mild surrounding.   Clinical Impression   PTA, pt was living with her daughter and had a PCA for bathing and dressing. Currently, pt requires Max for LB ADLs and Min A +2 functional mobility using RW. Pt presenting with decreased activity tolerance, balance, and cognition. Pt would benefit form further acute OT to facilitate safe dc. Recommend dc SNF for further OT to increase safety and independence with ADLs and functional mobility before dc home.     Follow Up Recommendations  SNF;Supervision/Assistance - 24 hour    Equipment Recommendations  Other (comment)(Defer ot next venue)    Recommendations for Other Services PT consult     Precautions / Restrictions Precautions Precautions: Fall Restrictions Weight Bearing Restrictions: No      Mobility Bed Mobility Overal bed mobility: Needs Assistance Bed Mobility: Supine to Sit     Supine to sit: Mod assist;+2 for physical assistance     General bed mobility comments: modAx2 for trunk to upright, LE off bed and pad scoot of hips to EoB, vc for use of handrail to aid in coming to upright  Transfers Overall transfer level: Needs assistance Equipment used: Rolling walker (2 wheeled)   Sit to Stand: Min assist;+2 physical assistance         General transfer comment: minAx2 for power up and stabilizing with RW, vc for  hand and feet placement    Balance   Sitting-balance support: Feet supported;No upper extremity supported Sitting balance-[name]: Fair     Standing balance support: Bilateral upper extremity supported;During functional activity Standing balance-[name]: Poor Standing balance comment: heavy reliance on RW                           ADL either performed or assessed with clinical judgement   ADL Overall ADL's : Needs assistance/impaired Eating/Feeding: Set up;Sitting   Grooming: Set up;Sitting   Upper Body Bathing: Minimal assistance;Sitting   Lower Body Bathing: Maximal assistance;Sit to/from stand   Upper Body Dressing : Minimal assistance;Sitting   Lower Body Dressing: Maximal assistance;Sit to/from stand   Toilet Transfer: Maximal assistance;+2 for safety/equipment;Stand-pivot;RW(Simulated to recliner)             General ADL Comments: Pt demonstrating decreased funcitonal performance as seen by fatigue, poor balance, and decreased cognititon.      Vision Baseline Vision/History: Wears glasses Wears Glasses: Reading only Patient Visual Report: No change from baseline Vision Assessment?: Yes Eye Alignment: Within Functional Limits Tracking/Visual Pursuits: Decreased smoothness of horizontal tracking;Decreased smoothness of vertical tracking Additional Comments: Pt unable to maintain lateral eye gaze and dmeonstrating poor visual attention. D     Perception     Praxis      Pertinent Vitals/Pain Pain Assessment: 0-10 Pain Score: 8  Pain Location: Center of chest Pain Descriptors / Indicators: Grimacing;Sore;Guarding Pain Intervention(s): Monitored during session;Limited activity within patient's tolerance;Repositioned     Hand Dominance Right  Extremity/Trunk Assessment Upper Extremity Assessment Upper Extremity Assessment: Generalized weakness;LUE deficits/detail LUE Deficits / Details: L hand weaker than R. decreased grasp and UE  strength LUE Coordination: decreased fine motor;decreased gross motor   Lower Extremity Assessment Lower Extremity Assessment: Generalized weakness   Cervical / Trunk Assessment Cervical / Trunk Assessment: Other exceptions Cervical / Trunk Exceptions: increased body habitus   Communication Communication Communication: No difficulties   Cognition Arousal/Alertness: Awake/alert Behavior During Therapy: WFL for tasks assessed/performed Overall Cognitive Status: No family/caregiver present to determine baseline cognitive functioning Area of Impairment: Attention;Memory;Following commands;Orientation;Awareness;Problem solving                 Orientation Level: Disoriented to;Place;Time Current Attention Level: Selective Memory: Decreased short-term memory Following Commands: Follows one step commands with increased time   Awareness: Emergent Problem Solving: Slow processing;Requires verbal cues General Comments: Pt requiring increased time and cues to sequence tasks. Pt with difficulty answering orientation questions (place and time) but answered home set up and PLOF without difficulty. No family present to confirm information and cognittive baseline   General Comments       Exercises     Shoulder Instructions      Home Living Family/patient expects to be discharged to:: Skilled nursing facility Living Arrangements: Children Available Help at Discharge: Family;Personal care attendant(PCA M-F 3.5 hours) Type of Home: House Home Access: Stairs to enter [employer] of Steps: 3 Entrance Stairs-Rails: Right;Left       Bathroom Shower/Tub: Tub/shower unit;Curtain   Bathroom Toilet: Standard(toilet riser)     Home Equipment: Toilet riser;Walker - 4 wheels;Shower seat          Prior Functioning/Environment Level of Independence: Needs assistance  Gait / Transfers Assistance Needed: house hold mobility with RW and community with Rollator ADL's /  Homemaking Assistance Needed: bathing and dressing by PCA            OT Problem List: Decreased strength;Decreased activity tolerance;Impaired UE functional use;Pain      OT Treatment/Interventions: Self-care/ADL training;Therapeutic exercise;Energy conservation;DME and/or AE instruction;Therapeutic activities;Patient/family education    OT Goals(Current goals can be found in the care plan section) Acute Rehab OT Goals Patient Stated Goal: none stated OT Goal Formulation: With patient Time For Goal Achievement: 04/25/17 Potential to Achieve Goals: Good  OT Frequency: Min 2X/week   Barriers to D/C:            Co-evaluation PT/OT/SLP Co-Evaluation/Treatment: Yes Reason for Co-Treatment: For patient/therapist safety PT goals addressed during session: Mobility/safety with mobility OT goals addressed during session: ADL's and self-care      AM-PAC PT "6 Clicks" Daily Activity     Outcome Measure Help from another person eating meals?: A Little Help from another person taking care of personal grooming?: A Little Help from another person toileting, which includes using toliet, bedpan, or urinal?: A Lot Help from another person bathing (including washing, rinsing, drying)?: A Lot Help from another person to put on and taking off regular upper body clothing?: A Little Help from another person to put on and taking off regular lower body clothing?: A Lot 6 Click Score: 15   End of Session Equipment Utilized During Treatment: Rolling walker;Gait belt Nurse Communication: Mobility status  Activity Tolerance: Patient tolerated treatment well;Patient limited by fatigue Patient left: in chair;with call bell/phone within reach;with chair alarm set  OT Visit Diagnosis: Unsteadiness on feet (R26.81);Muscle weakness (generalized) (M62.81);Pain Pain - part of body: (Chest)  Time: 9718-2099 OT Time Calculation (min): 29 min Charges:  OT General Charges $OT Visit: 1  Visit OT Evaluation $OT Eval Moderate Complexity: 1 Mod G-Codes:     Sheana Bir MSOT, OTR/L Acute Rehab Pager: [phone] Office: [place] 04/26/2017, 5:11 PM
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
## 2017-04-26 NOTE — Care Management Note (Signed)
Case Management Note  Patient Details  Name: Gregoria Selvy MRN: [id number] Date of Birth: 10-28-1940  Subjective/Objective:  Pt in with confusion. He is ESRD and does dialysis M,W,F. He is from home with family.                  Action/Plan: Awaiting PT/OT evals. CM following for d/c needs, physician orders.   Expected Discharge Date:                  Expected Discharge Plan:     In-House Referral:     Discharge planning Services     Post Acute Care Choice:    Choice offered to:     DME Arranged:    DME Agency:     HH Arranged:    HH Agency:     Status of Service:  In process, will continue to follow  If discussed at Long Length of Stay Meetings, dates discussed:    Additional Comments:  Pollie Friar, RN 04/26/2017, 2:12 PM

## 2017-04-26 NOTE — Progress Notes (Signed)
PT Cancellation Note  Patient Details Name: Ashlee Miles MRN: [id number] DOB: 1941-06-12   Cancelled Treatment:    Reason Eval/Treat Not Completed: Patient at procedure or test/unavailable Pt currently at HD. PT will check back this afternoon as able. Thanks,   Dani Gobble. Migdalia Dk PT, DPT Acute Rehabilitation  [phone] Pager [phone]     [place] 04/26/2017, 11:16 AM

## 2017-04-27 ENCOUNTER — Encounter (HOSPITAL_COMMUNITY): Payer: Self-pay | Admitting: *Deleted

## 2017-04-27 ENCOUNTER — Other Ambulatory Visit: Payer: Self-pay

## 2017-04-27 ENCOUNTER — Inpatient Hospital Stay (HOSPITAL_COMMUNITY): Payer: 59

## 2017-04-27 LAB — CBC WITH DIFFERENTIAL/PLATELET
Basophils Absolute: 0 10*3/uL (ref 0.0–0.1)
Basophils Relative: 0 %
Eosinophils Absolute: 0.4 10*3/uL (ref 0.0–0.7)
Eosinophils Relative: 3 %
HCT: 25.9 % — ABNORMAL LOW (ref 36.0–46.0)
Hemoglobin: 8.2 g/dL — ABNORMAL LOW (ref 12.0–15.0)
Lymphocytes Relative: 14 %
Lymphs Abs: 2.5 10*3/uL (ref 0.7–4.0)
MCH: 25.5 pg — ABNORMAL LOW (ref 26.0–34.0)
MCHC: 31.7 g/dL (ref 30.0–36.0)
MCV: 80.7 fL (ref 78.0–100.0)
Monocytes Absolute: 0.8 10*3/uL (ref 0.1–1.0)
Monocytes Relative: 5 %
Neutro Abs: 13.6 10*3/uL — ABNORMAL HIGH (ref 1.7–7.7)
Neutrophils Relative %: 78 %
Platelets: 225 10*3/uL (ref 150–400)
RBC: 3.21 MIL/uL — ABNORMAL LOW (ref 3.87–5.11)
RDW: 16.2 % — ABNORMAL HIGH (ref 11.5–15.5)
WBC: 17.4 10*3/uL — ABNORMAL HIGH (ref 4.0–10.5)

## 2017-04-27 LAB — GLUCOSE, CAPILLARY
Glucose-Capillary: 176 mg/dL — ABNORMAL HIGH (ref 65–99)
Glucose-Capillary: 238 mg/dL — ABNORMAL HIGH (ref 65–99)
Glucose-Capillary: 279 mg/dL — ABNORMAL HIGH (ref 65–99)
Glucose-Capillary: 156 mg/dL — ABNORMAL HIGH (ref 65–99)

## 2017-04-27 LAB — LACTIC ACID, PLASMA: Lactic Acid, Venous: 0.7 mmol/L (ref 0.5–1.9)

## 2017-04-27 LAB — MAGNESIUM: Magnesium: 1.7 mg/dL (ref 1.7–2.4)

## 2017-04-27 LAB — COMPREHENSIVE METABOLIC PANEL
ALT: 9 U/L — ABNORMAL LOW (ref 14–54)
AST: 15 U/L (ref 15–41)
Albumin: 2.5 g/dL — ABNORMAL LOW (ref 3.5–5.0)
Alkaline Phosphatase: 69 U/L (ref 38–126)
Anion gap: 9 (ref 5–15)
BUN: 12 mg/dL (ref 6–20)
CO2: 30 mmol/L (ref 22–32)
Calcium: 8 mg/dL — ABNORMAL LOW (ref 8.9–10.3)
Chloride: 96 mmol/L — ABNORMAL LOW (ref 101–111)
Creatinine, Ser: 3.4 mg/dL — ABNORMAL HIGH (ref 0.44–1.00)
GFR calc Af Amer: 14 mL/min — ABNORMAL LOW (ref >60)
GFR calc non Af Amer: 12 mL/min — ABNORMAL LOW (ref >60)
Glucose, Bld: 166 mg/dL — ABNORMAL HIGH (ref 65–99)
Potassium: 3.6 mmol/L (ref 3.5–5.1)
Sodium: 135 mmol/L (ref 135–145)
Total Bilirubin: 0.6 mg/dL (ref 0.3–1.2)
Total Protein: 5.6 g/dL — ABNORMAL LOW (ref 6.5–8.1)

## 2017-04-27 LAB — T4, FREE: Free T4: 1.45 ng/dL — ABNORMAL HIGH (ref 0.61–1.12)

## 2017-04-27 LAB — PROTIME-INR
INR: 2.58
Prothrombin Time: 27.4 seconds — ABNORMAL HIGH (ref 11.4–15.2)

## 2017-04-27 LAB — VITAMIN B12: Vitamin B-12: 2787 pg/mL — ABNORMAL HIGH (ref 180–914)

## 2017-04-27 LAB — TSH: TSH: 0.877 u[IU]/mL (ref 0.350–4.500)

## 2017-04-27 MED ORDER — POLYETHYLENE GLYCOL 3350 17 G PO PACK
17.0000 g | PACK | Freq: Every day | ORAL | Status: DC
  Administered 2017-04-27: 17 g via ORAL
  Filled 2017-04-27 (×2): qty 1

## 2017-04-27 MED ORDER — HYDROCODONE-ACETAMINOPHEN 5-325 MG PO TABS
1.0000 | ORAL_TABLET | ORAL | Status: DC | PRN
  Administered 2017-04-27 (×2): 1 via ORAL
  Filled 2017-04-27 (×2): qty 1

## 2017-04-27 MED ORDER — NEPRO/CARBSTEADY PO LIQD
237.0000 mL | Freq: Three times a day (TID) | ORAL | Status: DC
  Administered 2017-04-27 (×2): 237 mL via ORAL
  Filled 2017-04-27 (×8): qty 237

## 2017-04-27 MED ORDER — METHOCARBAMOL 500 MG PO TABS: 500.0000 mg | ORAL_TABLET | Freq: Three times a day (TID) | ORAL | Status: DC | PRN

## 2017-04-27 NOTE — Progress Notes (Signed)
Patient ID: Ashlee Miles, female   DOB: [birth year], 76 y.o.   MRN: [id number]  [facility] Progress Note   Assessment/ Plan:   1. Deconditioning/altered mental status: mobility impaired especially after she suffered her DVT and it is suspected that she has continued to become further deconditioned with falls at home. CT scan of the head negative for any acute intracranial pathology including subdural hematoma. No clear focus of infection to suggest delirium. 2. End-stage renal disease:continue hemodialysis on a Monday/Wednesday/Friday schedule. Ordered for hemodialysis tomorrow via left femoral loop graft. 3. Hypotension/volume: continue midodrine for blood pressure support and efforts at ultrafiltration/lowering dry weight with hemodialysis. 4. Anemia of chronic kidney disease: continue ESA and intravenous iron for anemia management. 5. Secondary hyperparathyroidism: Continue Hectorol for PTH suppression and resume renal diet with phosphorus restriction as well as phosphorus binders. 6. Cough, sputum production: based on negative chest x-ray findings and lung exam, this appears to be primarily upper respiratory in origin, symptom management. 7. Epigastric/abdominal pain: CT scan of the abdomen showed diffuse gastric wall thickening with mild surrounding inflammatory changes concerning for gastritis versus lipomatous infiltration-endoscopy will be done as an outpatient when patient more stable from acute hospitalization. 8. Right leg DVT: Ongoing anticoagulation with warfarin and with IVC filter in situ.  Subjective:   Reports to be uncomfortable with continued multifocal pain. Intermittently confused.   Objective:   BP (!) 139/44 (BP Location: Right Arm)   Pulse 74   Temp 98.7 F (37.1 C) (Oral)   Resp 18   Ht 5\' 2"  (1.575 m)   Wt 101.8 kg (224 lb 6.9 oz)   SpO2 100%   BMI 41.05 kg/m   Physical Exam: DQQ:IWLNLGX fairly comfortable resting in a recliner QJJ:HERDE regular  rhythm, normal rate, S1 and S2 normal Resp:diminished breath sounds over bases-poor inspiratory effort, transmitted sounds. Abd: soft, obese, mildly tender over epigastric area, bowel sounds normal Ext: 2-3+ right lower extremity edema, 1+ left lower extremity edema  Labs: BMET Recent Labs  Lab 04/21/17 2057 04/25/17 1145 04/26/17 0020 04/26/17 0348 04/27/17 0414  NA 132* 138  --  139 135  K 4.7 4.3  --  3.8 3.6  CL 96* 97*  --  98* 96*  CO2 26 24  --  30 30  GLUCOSE 233* 228*  --  173* 166*  BUN 19 18  --  21* 12  CREATININE 3.09* 3.99*  --  4.65* 3.40*  CALCIUM 7.9* 7.8*  --  8.0* 8.0*  PHOS  --   --  4.3  --   --    CBC Recent Labs  Lab 04/21/17 2057 04/25/17 1455 04/26/17 0348 04/27/17 0414  WBC 9.0 19.3* 18.3* 17.4*  NEUTROABS 6.2  --  14.8* 13.6*  HGB 10.4* 9.3* 9.0* 8.2*  HCT 32.2* 30.1* 28.8* 25.9*  MCV 80.3 80.9 81.1 80.7  PLT 148* 285 255 225    Medications:    . aspirin  325 mg Oral Daily  . calcitRIOL  0.5 mcg Oral Daily  . carvedilol  6.25 mg Oral BID  . famotidine  20 mg Oral Daily  . feeding supplement (NEPRO CARB STEADY)  237 mL Oral TID BM  . insulin aspart  0-5 Units Subcutaneous QHS  . insulin aspart  0-9 Units Subcutaneous TID WC  . insulin glargine  3 Units Subcutaneous Daily  . linaclotide  145 mcg Oral QAC breakfast  . midodrine  10 mg Oral Q M,W,F-HD  . multivitamin  1 tablet Oral  QHS  . pantoprazole  80 mg Oral Daily  . simvastatin  40 mg Oral Daily  . sodium chloride flush  3 mL Intravenous Q12H  . sucralfate  1 g Oral TID WC & HS  . warfarin  5 mg Oral q1800  . Warfarin - Pharmacist Dosing Inpatient   Does not apply K2301   Elmarie Shiley, MD 04/27/2017, 9:27 AM

## 2017-04-27 NOTE — Clinical Social Work Note (Signed)
Clinical Social Work Assessment  Patient Details  Name: Ashlee Miles MRN: [id number] Date of Birth: 06/09/1941  Date of referral:  04/27/17               Reason for consult:  Facility Placement                Permission sought to share information with:  Facility Sport and exercise psychologist, Family Supports Permission granted to share information::  Yes, Verbal Permission Granted  Name::     Diplomatic Services operational officer::  SNF  Relationship::  Daughter  Contact Information:     Housing/Transportation Living arrangements for the past 2 months:  Single Family Home Source of Information:  Patient Patient Interpreter Needed:  None Criminal Activity/Legal Involvement Pertinent to Current Situation/Hospitalization:  No - Comment as needed Significant Relationships:  Adult Children Lives with:  Self, Adult Children Do you feel safe going back to the place where you live?  Yes Need for family participation in patient care:  No (Coment)  Care giving concerns:  Patient has been living at home with daughter, but will benefit from rehab at discharge before returning home.   Social Worker assessment / plan:  CSW met with patient to discuss recommendation for SNF. CSW discussed facility options with the patient. CSW to fax out referral and follow up with bed offers. CSW alerted by RN that patient's daughter had called and had SNF preferences; CSW left daughter a Advertising account executive. CSW to follow.  Employment status:  Retired Nurse, adult PT Recommendations:  [place] / Referral to community resources:  [place]  Patient/Family's Response to care:  Patient agreeable to SNF placement.  Patient/Family's Understanding of and Emotional Response to Diagnosis, Current Treatment, and Prognosis:  Patient was lethargic and in pain during discussion, so she didn't want to talk long. Patient indicated that she thought that rehab would be a good idea, but didn't  know much about where to go or what happened. Patient said she wanted to be able to talk about facility options later when she felt better.  Emotional Assessment Appearance:  Appears stated age Attitude/Demeanor/Rapport:  Lethargic Affect (typically observed):  Appropriate Orientation:  Oriented to Self, Oriented to Place, Oriented to  Time, Oriented to Situation Alcohol / Substance use:  Not Applicable Psych involvement (Current and /or in the community):  No (Comment)  Discharge Needs  Concerns to be addressed:  Care Coordination Readmission within the last 30 days:  Yes Current discharge risk:  Physical Impairment Barriers to Discharge:  Continued Medical Work up, [place], [place] 04/27/2017, 12:54 PM

## 2017-04-27 NOTE — Progress Notes (Signed)
Paged [facility]: cardiac monitoring: expired ordered. Diet should be renal? Dr ordered continued cardiac monitor, Diet changed to Renal with fluid restriction of 1200 cc/day.

## 2017-04-27 NOTE — Discharge Instructions (Signed)
Information on my medicine - Coumadin   (Warfarin)  Why was Coumadin prescribed for you? Coumadin was prescribed for you because you have a blood clot or a medical condition that can cause an increased risk of forming blood clots. Blood   What test will check on my response to Coumadin? While on Coumadin (warfarin) you will need to have an INR test regularly to ensure that your dose is keeping you in the desired range. The INR (international normalized ratio) number is calculated from the result of the laboratory test called prothrombin time (PT).  If an INR APPOINTMENT HAS NOT ALREADY BEEN MADE FOR YOU please schedule an appointment to have this lab work done by your health care provider within 7 days. Your INR goal is usually a number between:  2 to 3 or your provider may give you a more narrow range like 2-2.5.  Ask your health care provider during an office visit what your goal INR is.  What  do you need to  know  About  COUMADIN? Take Coumadin (warfarin) exactly as prescribed by your healthcare provider about the same time each day.  DO NOT stop taking without talking to the doctor who prescribed the medication.  Stopping without other blood clot prevention medication to take the place of Coumadin may increase your risk of developing a new clot or stroke.  Get refills before you run out.  What do you do if you miss a dose? If you miss a dose, take it as soon as you remember on the same day then continue your regularly scheduled regimen the next day.  Do not take two doses of Coumadin at the same time.  Important Safety Information A possible side effect of Coumadin (Warfarin) is an increased risk of bleeding. You should call your healthcare provider right away if you experience any of the following: ? Bleeding from an injury or your nose that does not stop. ? Unusual colored urine (red or dark [place]) or unusual colored stools (red or black). ? Unusual bruising for unknown reasons. ? A  serious fall or if you hit your head (even if there is no bleeding).  Some foods or medicines interact with Coumadin (warfarin) and might alter your response to warfarin. To help avoid this: ? Eat a balanced diet, maintaining a consistent amount of Vitamin K. ? Notify your provider about major diet changes you plan to make. ? Avoid alcohol or limit your intake to 1 drink for women and 2 drinks for men per day. (1 drink is 5 oz. wine, 12 oz. beer, or 1.5 oz. liquor.)  Make sure that ANY health care provider who prescribes medication for you knows that you are taking Coumadin (warfarin).  Also make sure the healthcare provider who is monitoring your Coumadin knows when you have started a new medication including herbals and non-prescription products.  Coumadin (Warfarin)  Major Drug Interactions  Increased Warfarin Effect Decreased Warfarin Effect  Alcohol (large quantities) Antibiotics (esp. Septra/Bactrim, Flagyl, Cipro) Amiodarone (Cordarone) Aspirin (ASA) Cimetidine (Tagamet) Megestrol (Megace) NSAIDs (ibuprofen, naproxen, etc.) Piroxicam (Feldene) Propafenone (Rythmol SR) Propranolol (Inderal) Isoniazid (INH) Posaconazole (Noxafil) Barbiturates (Phenobarbital) Carbamazepine (Tegretol) Chlordiazepoxide (Librium) Cholestyramine (Questran) Griseofulvin Oral Contraceptives Rifampin Sucralfate (Carafate) Vitamin K   Coumadin (Warfarin) Major Herbal Interactions  Increased Warfarin Effect Decreased Warfarin Effect  Garlic Ginseng Ginkgo biloba Coenzyme Q10 Green tea St. Johns wort    Coumadin (Warfarin) FOOD Interactions  Eat a consistent number of servings per week of foods HIGH in Vitamin  K (1 serving =  cup)  Collards (cooked, or boiled & drained) Kale (cooked, or boiled & drained) Mustard greens (cooked, or boiled & drained) Parsley *serving size only =  cup Spinach (cooked, or boiled & drained) Swiss chard (cooked, or boiled & drained) Turnip greens (cooked,  or boiled & drained)  Eat a consistent number of servings per week of foods MEDIUM-HIGH in Vitamin K (1 serving = 1 cup)  Asparagus (cooked, or boiled & drained) Broccoli (cooked, boiled & drained, or raw & chopped) Brussel sprouts (cooked, or boiled & drained) *serving size only =  cup Lettuce, raw (green leaf, endive, romaine) Spinach, raw Turnip greens, raw & chopped   These websites have more information on Coumadin (warfarin):  FailFactory.se; VeganReport.com.au;

## 2017-04-27 NOTE — Progress Notes (Signed)
Bedside report given to [facility]. Patient appeared more confused this am, although she had intermittent confusion during the night. From approximately 7pm until 3 or 4 am this morning she seemed more confused. Patient is pleasant. Safety maintained.

## 2017-04-27 NOTE — Progress Notes (Signed)
[facility] Progress Note  Patient: Ashlee Miles VXY:[id number]   PCP: Darlina Rumpf, MD DOB: 1940-08-01   DOA: 04/25/2017   DOS: 04/27/2017   Date of Service: the patient was seen and examined on 04/27/2017  Subjective: Still has the abdominal pain, no nausea vomiting.  Daughter reported an episode of confusion earlier this morning.  No diarrhea. poor appetite.  No fever no chills.  Brief hospital course: Pt. with PMH of ESRD on HD, protein C deficiency on anticoagulation, pituitary adenoma, hypertension, chronic diastolic CHF, type II DM; admitted on 04/25/2017, presented with complaint of abdominal pain, was found to have UTI, chronic gastritis. Currently further plan is to arrange SNF after completion of HD tomorrow.  Assessment and Plan: 1.  Epigastric abdominal pain. CT scan shows diffuse gastric wall thickening consistent with gastritis versus lymphomatous infiltration. EGD recommended. ESR and CRP elevated, H pylori pending.  Lactic acid normal. Continue Carafate. Continue PPI as well as H2 blocker. Discussed with [facility], patient is currently on anticoagulation with warfarin and patient does not have any reporting history of active bleeding, no hematemesis, no melena. Currently performing an inpatient EGD is not recommended and would recommend outpatient follow-up for now. Monitor daily H&H.  2.  Fall, near syncope. Subconjunctival hemorrhage on the left eye. Presented with a fall, PT OT consult pending. CT head unremarkable, MRI brain MRI C-spine currently pending. Possible UTI cannot be ruled out and therefore patient will be treated with IV ceftriaxone.  3.  ESRD. On HD Monday Wednesday Friday. Continue HD per nephrology appreciate input.  4.  Type 2 diabetes mellitus. On low-dose Lantus right now along with sliding scale insulin. We will monitor for now.  5.  History of DVT. 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 On warfarin.  Monitor for now.  6.   Chronic diastolic CHF. EF preserved. Continue hemodialysis, continue Coreg.  7.  Constipation. Continue bowel regimen.  8.  History of dementia. Likely acute delirium in the setting of UTI as well as possible concussion injury. MRI negative. Check M27 and folic acid TSH. This may also represent progression of her dementia.  Diet: Renal diet DVT Prophylaxis on therapeutic anticoagulation.  Advance goals of care discussion: DNR DNI  Family Communication: family was present at bedside, at the time of interview. The pt provided permission to discuss medical plan with the family. Opportunity was given to ask question and all questions were answered satisfactorily.   Disposition:  Discharge to SNF.  Consultants: none Procedures: HD  Antibiotics: Anti-infectives (From admission, onward)   Start     Dose/Rate Route Frequency Ordered Stop   04/26/17 1430  cefTRIAXone (ROCEPHIN) 1 g in dextrose 5 % 50 mL IVPB     1 g 100 mL/hr over 30 Minutes Intravenous Daily 04/26/17 1419         Objective: Physical Exam: Vitals:   04/27/17 0610 04/27/17 0858 04/27/17 1337 04/27/17 1649  BP: 125/60 (!) 139/44 (!) 90/42 (!) 102/46  Pulse: 77 74 74 68  Resp:  _0 Temp:  98.7 F (37.1 C) 98.6 F (37 C) 97.6 F (36.4 C)  TempSrc:  Oral Oral Oral  SpO2:  100% 95% 100%  Weight:      Height:        Intake/Output Summary (Last 24 hours) at 04/27/2017 1733 Last data filed at 04/27/2017 0900 Gross per 24 hour  Intake 463 ml  Output -  Net 463 ml   [employer]  04/25/17 2032 04/26/17 0710 04/26/17 1130  Weight: 106.5 kg (234 lb 12.6 oz) 103.3 kg (227 lb 11.8 oz) 101.8 kg (224 lb 6.9 oz)   General: Alert, Awake and Oriented to Time, Place and Person. Appear in mild distress, affect appropriate Eyes: PERRL, left eye subconjunctival hemorrhage, old bruising on nose as well as underneath bilateral eye ENT: Oral Mucosa clear moist Neck: no JVD, no Abnormal Mass Or  lumps Cardiovascular: S1 and S2 Present, no Murmur, Peripheral Pulses Present Respiratory: normal respiratory effort, Bilateral Air entry equal and Decreased, no use of accessory muscle, Clear to Auscultation, no Crackles, no wheezes Abdomen: Bowel Sound present, Soft and epigastric tenderness, no hernia Skin: no redness, no Rash, no induration Extremities: bilateral Pedal edema, no calf tenderness Neurologic: Grossly no focal neuro deficit. Bilaterally Equal motor strength  Data Reviewed: CBC: Recent Labs  Lab 04/21/17 2057 04/25/17 1455 04/26/17 0348 04/27/17 0414  WBC 9.0 19.3* 18.3* 17.4*  NEUTROABS 6.2  --  14.8* 13.6*  HGB 10.4* 9.3* 9.0* 8.2*  HCT 32.2* 30.1* 28.8* 25.9*  MCV 80.3 80.9 81.1 80.7  PLT 148* 285 255 983   Basic Metabolic Panel: Recent Labs  Lab 04/21/17 2057 04/25/17 1145 04/26/17 0020 04/26/17 0348 04/27/17 0414  NA 132* 138  --  139 135  K 4.7 4.3  --  3.8 3.6  CL 96* 97*  --  98* 96*  CO2 26 24  --  30 30  GLUCOSE 233* 228*  --  173* 166*  BUN 19 18  --  21* 12  CREATININE 3.09* 3.99*  --  4.65* 3.40*  CALCIUM 7.9* 7.8*  --  8.0* 8.0*  MG  --   --  1.8  --  1.7  PHOS  --   --  4.3  --   --     Liver Function Tests: Recent Labs  Lab 04/21/17 2057 04/25/17 1145 04/26/17 0348 04/27/17 0414  AST _0 ALT 11* 11* 9* 9*  ALKPHOS 67 74 72 69  BILITOT 0.4 0.9 0.9 0.6  PROT 6.7 6.4* 6.2* 5.6*  ALBUMIN 3.2* 3.0* 2.7* 2.5*   Recent Labs  Lab 04/25/17 1145  LIPASE 17   No results for input(s): AMMONIA in the last 168 hours. Coagulation Profile: Recent Labs  Lab 04/26/17 0020 04/26/17 0348 04/27/17 0414  INR 2.38 2.26 2.58   Cardiac Enzymes: No results for input(s): CKTOTAL, CKMB, CKMBINDEX, TROPONINI in the last 168 hours. BNP (last 3 results) No results for input(s): PROBNP in the last 8760 hours. CBG: Recent Labs  Lab 04/25/17 2100 04/26/17 0614 04/27/17 0633 04/27/17 1156 04/27/17 1631  GLUCAP 201* 160* 176*  238* 279*   Studies: Mr Brain Wo Contrast  Result Date: 04/27/2017 CLINICAL DATA:  Recurrent syncope EXAM: MRI HEAD WITHOUT CONTRAST MRI CERVICAL SPINE WITHOUT CONTRAST TECHNIQUE: Multiplanar, multiecho pulse sequences of the brain and surrounding structures, and cervical spine, to include the craniocervical junction and cervicothoracic junction, were obtained without intravenous contrast. COMPARISON:  Head CT 04/25/2017 FINDINGS: MRI HEAD FINDINGS Brain: The midline structures are normal. There is no acute infarct or acute hemorrhage. No mass lesion, hydrocephalus, dural abnormality or extra-axial collection. Mild periventricular T2 hyperintensity. No age-advanced or lobar predominant atrophy. No chronic microhemorrhage or superficial siderosis. Vascular: Major intracranial arterial and venous sinus flow voids are preserved. Skull and upper cervical spine: The visualized skull base, calvarium, upper cervical spine and extracranial soft tissues are normal. Sinuses/Orbits: No fluid levels or advanced mucosal thickening.  No mastoid or middle ear effusion. Normal orbits. MRI CERVICAL SPINE FINDINGS Alignment: Straightening of the normal cervical lordosis. No static subluxation. Vertebrae: No fracture, evidence of discitis, or bone lesion. There is ACDF at C4-C5. Cord: No focal signal abnormality. Posterior Fossa, vertebral arteries, paraspinal tissues: Normal vertebral artery flow voids. No prevertebral soft tissue swelling. Disc levels: C1-C2: Normal. C2-C3: Normal disc space and facets. No spinal canal or neuroforaminal stenosis. C3-C4: Large disc osteophyte complex and bilateral uncovertebral hypertrophy severe spinal canal stenosis with flattening of the spinal cord. Moderate right neural foraminal stenosis. C4-C5: Postfusion changes without stenosis. C5-C6: Small disc bulge without stenosis. C6-C7: Small disc bulge without stenosis. C7-T1: Normal disc space and facets. No spinal canal or neuroforaminal  stenosis. IMPRESSION: 1. Normal brain MRI for age. 2. 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 3. Moderate right C3-4 neural foraminal stenosis. Electronically Signed   By: Ulyses Jarred M.D.   On: 04/27/2017 02:27   Mr Cervical Spine Wo Contrast  Result Date: 04/27/2017 CLINICAL DATA:  Recurrent syncope EXAM: MRI HEAD WITHOUT CONTRAST MRI CERVICAL SPINE WITHOUT CONTRAST TECHNIQUE: Multiplanar, multiecho pulse sequences of the brain and surrounding structures, and cervical spine, to include the craniocervical junction and cervicothoracic junction, were obtained without intravenous contrast. COMPARISON:  Head CT 04/25/2017 FINDINGS: MRI HEAD FINDINGS Brain: The midline structures are normal. There is no acute infarct or acute hemorrhage. No mass lesion, hydrocephalus, dural abnormality or extra-axial collection. Mild periventricular T2 hyperintensity. No age-advanced or lobar predominant atrophy. No chronic microhemorrhage or superficial siderosis. Vascular: Major intracranial arterial and venous sinus flow voids are preserved. Skull and upper cervical spine: The visualized skull base, calvarium, upper cervical spine and extracranial soft tissues are normal. Sinuses/Orbits: No fluid levels or advanced mucosal thickening. No mastoid or middle ear effusion. Normal orbits. MRI CERVICAL SPINE FINDINGS Alignment: Straightening of the normal cervical lordosis. No static subluxation. Vertebrae: No fracture, evidence of discitis, or bone lesion. There is ACDF at C4-C5. Cord: No focal signal abnormality. Posterior Fossa, vertebral arteries, paraspinal tissues: Normal vertebral artery flow voids. No prevertebral soft tissue swelling. Disc levels: C1-C2: Normal. C2-C3: Normal disc space and facets. No spinal canal or neuroforaminal stenosis. C3-C4: Large disc osteophyte complex and bilateral uncovertebral hypertrophy severe spinal canal  stenosis with flattening of the spinal cord. Moderate right neural foraminal stenosis. C4-C5: Postfusion changes without stenosis. C5-C6: Small disc bulge without stenosis. C6-C7: Small disc bulge without stenosis. C7-T1: Normal disc space and facets. No spinal canal or neuroforaminal stenosis. IMPRESSION: 1. Normal brain MRI for age. 2. 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 3. Moderate right C3-4 neural foraminal stenosis. Electronically Signed   By: Ulyses Jarred M.D.   On: 04/27/2017 02:27    Scheduled Meds: . aspirin  325 mg Oral Daily  . calcitRIOL  0.5 mcg Oral Daily  . carvedilol  6.25 mg Oral BID  . famotidine  20 mg Oral Daily  . feeding supplement (NEPRO CARB STEADY)  237 mL Oral TID BM  . insulin aspart  0-5 Units Subcutaneous QHS  . insulin aspart  0-9 Units Subcutaneous TID WC  . insulin glargine  3 Units Subcutaneous Daily  . linaclotide  145 mcg Oral QAC breakfast  . midodrine  10 mg Oral Q M,W,F-HD  . multivitamin  1 tablet Oral QHS  . pantoprazole  80 mg  Oral Daily  . polyethylene glycol  17 g Oral Daily  . simvastatin  40 mg Oral Daily  . sodium chloride flush  3 mL Intravenous Q12H  . sucralfate  1 g Oral TID WC & HS  . warfarin  5 mg Oral q1800  . Warfarin - Pharmacist Dosing Inpatient   Does not apply q1800   Continuous Infusions: . sodium chloride    . cefTRIAXone (ROCEPHIN)  IV Stopped (04/27/17 1007)   PRN Meds: sodium chloride, HYDROcodone-acetaminophen, hydrOXYzine, ipratropium-albuterol, methocarbamol, ondansetron **OR** ondansetron (ZOFRAN) IV, sodium chloride flush, sodium chloride flush  Time spent: 35 minutes  Author: Berle Mull, MD [facility] Pager: [phone] 04/27/2017 5:33 PM  If 7PM-7AM, please contact night-coverage at [email or web address], password [facility]

## 2017-04-27 NOTE — Progress Notes (Signed)
[place] for warfarin Indication: DVT hx   Assessment: 17 yof with history of ESRD on HD, DVT, Protein C deficiency on warfarin PTA. Pharmacy consulted to dose while inpatient. Noted multiple recent falls noted - CT head negative for bleed. Oral intake has been variable recently per MD note. Hg 8.2, plt wnl. No bleed documented. INR 2.5 today.  PTA warfarin dose: 5mg  daily (last dose 04/24/17 PTA)   Goal of Therapy:  INR 2-3 Monitor platelets by anticoagulation protocol: Yes   Plan:  Continue warfarin 5 mg po daily Daily INR    Hughes Better, PharmD, BCPS Clinical Pharmacist 04/27/2017 8:47 AM

## 2017-04-28 LAB — COMPREHENSIVE METABOLIC PANEL
ALT: 9 U/L — ABNORMAL LOW (ref 14–54)
AST: 17 U/L (ref 15–41)
Albumin: 2.7 g/dL — ABNORMAL LOW (ref 3.5–5.0)
Alkaline Phosphatase: 128 U/L — ABNORMAL HIGH (ref 38–126)
Anion gap: 11 (ref 5–15)
BUN: 24 mg/dL — ABNORMAL HIGH (ref 6–20)
CO2: 28 mmol/L (ref 22–32)
Calcium: 8.5 mg/dL — ABNORMAL LOW (ref 8.9–10.3)
Chloride: 95 mmol/L — ABNORMAL LOW (ref 101–111)
Creatinine, Ser: 4.8 mg/dL — ABNORMAL HIGH (ref 0.44–1.00)
GFR calc Af Amer: 9 mL/min — ABNORMAL LOW (ref >60)
GFR calc non Af Amer: 8 mL/min — ABNORMAL LOW (ref >60)
Glucose, Bld: 213 mg/dL — ABNORMAL HIGH (ref 65–99)
Potassium: 3.7 mmol/L (ref 3.5–5.1)
Sodium: 134 mmol/L — ABNORMAL LOW (ref 135–145)
Total Bilirubin: 0.7 mg/dL (ref 0.3–1.2)
Total Protein: 6.1 g/dL — ABNORMAL LOW (ref 6.5–8.1)

## 2017-04-28 LAB — CBC WITH DIFFERENTIAL/PLATELET
Basophils Absolute: 0 10*3/uL (ref 0.0–0.1)
Basophils Relative: 0 %
Eosinophils Absolute: 0.6 10*3/uL (ref 0.0–0.7)
Eosinophils Relative: 5 %
HCT: 26.8 % — ABNORMAL LOW (ref 36.0–46.0)
Hemoglobin: 8.4 g/dL — ABNORMAL LOW (ref 12.0–15.0)
Lymphocytes Relative: 17 %
Lymphs Abs: 2.1 10*3/uL (ref 0.7–4.0)
MCH: 25.2 pg — ABNORMAL LOW (ref 26.0–34.0)
MCHC: 31.3 g/dL (ref 30.0–36.0)
MCV: 80.5 fL (ref 78.0–100.0)
Monocytes Absolute: 0.8 10*3/uL (ref 0.1–1.0)
Monocytes Relative: 6 %
Neutro Abs: 8.9 10*3/uL — ABNORMAL HIGH (ref 1.7–7.7)
Neutrophils Relative %: 72 %
Platelets: 249 10*3/uL (ref 150–400)
RBC: 3.33 MIL/uL — ABNORMAL LOW (ref 3.87–5.11)
RDW: 16 % — ABNORMAL HIGH (ref 11.5–15.5)
WBC: 12.4 10*3/uL — ABNORMAL HIGH (ref 4.0–10.5)

## 2017-04-28 LAB — PROTIME-INR
INR: 2.84
Prothrombin Time: 29.6 seconds — ABNORMAL HIGH (ref 11.4–15.2)

## 2017-04-28 LAB — GLUCOSE, CAPILLARY: Glucose-Capillary: 97 mg/dL (ref 65–99)

## 2017-04-28 LAB — MAGNESIUM: Magnesium: 1.8 mg/dL (ref 1.7–2.4)

## 2017-04-28 MED ORDER — METHOCARBAMOL 500 MG PO TABS: 500.0000 mg | ORAL_TABLET | Freq: Three times a day (TID) | ORAL | 0 refills | Status: DC | PRN

## 2017-04-28 MED ORDER — HYDROCODONE-ACETAMINOPHEN 5-325 MG PO TABS: 1.0000 | ORAL_TABLET | Freq: Three times a day (TID) | ORAL | 0 refills | Status: DC | PRN

## 2017-04-28 MED ORDER — PANTOPRAZOLE SODIUM 40 MG PO TBEC: 40.0000 mg | DELAYED_RELEASE_TABLET | Freq: Two times a day (BID) | ORAL | 0 refills | Status: AC

## 2017-04-28 MED ORDER — POLYETHYLENE GLYCOL 3350 17 G PO PACK: 17.0000 g | PACK | Freq: Every day | ORAL | 0 refills | Status: DC

## 2017-04-28 MED ORDER — CEPHALEXIN 500 MG PO CAPS: 500.0000 mg | ORAL_CAPSULE | Freq: Two times a day (BID) | ORAL | 0 refills | Status: AC

## 2017-04-28 MED ORDER — PAROXETINE HCL 10 MG PO TABS: 10.0000 mg | ORAL_TABLET | Freq: Every day | ORAL | 0 refills | Status: DC

## 2017-04-28 MED ORDER — HEPARIN SOD (PORK) LOCK FLUSH 100 UNIT/ML IV SOLN
500.0000 [IU] | INTRAVENOUS | Status: AC | PRN
  Administered 2017-04-28: 500 [IU]

## 2017-04-28 MED ORDER — MIDODRINE HCL 5 MG PO TABS
ORAL_TABLET | ORAL | Status: AC
  Filled 2017-04-28: qty 2

## 2017-04-28 MED ORDER — SUCRALFATE 1 GM/10ML PO SUSP: 1.0000 g | Freq: Three times a day (TID) | ORAL | 0 refills | Status: DC

## 2017-04-28 MED ORDER — PAROXETINE HCL 20 MG PO TABS
10.0000 mg | ORAL_TABLET | Freq: Every day | ORAL | Status: DC
  Administered 2017-04-28: 10 mg via ORAL
  Filled 2017-04-28: qty 1

## 2017-04-28 MED ORDER — FAMOTIDINE 20 MG PO TABS: 20.0000 mg | ORAL_TABLET | Freq: Every day | ORAL | 0 refills | Status: DC

## 2017-04-28 NOTE — Clinical Social Work Placement (Signed)
Nurse to call report to [phone], Room 220A  Transport set up for after 4:00 PM   CLINICAL SOCIAL WORK PLACEMENT  NOTE  Date:  04/28/2017  Patient Details  Name: Ashlee Miles MRN: [id number] Date of Birth: October 03, 1940  Clinical Social Work is seeking post-discharge placement for this patient at the [place] level of care (*CSW will initial, date and re-position this form in  chart as items are completed):  Yes   Patient/family provided with [facility] Work Department's list of facilities offering this level of care within the geographic area requested by the patient (or if unable, by the patient's family).  Yes   Patient/family informed of their freedom to choose among providers that offer the needed level of care, that participate in Medicare, Medicaid or managed care program needed by the patient, have an available bed and are willing to accept the patient.  Yes   Patient/family informed of [facility]'s ownership interest in [facility] and [facility], as well as of the fact that they are under no obligation to receive care at these facilities.  PASRR submitted to EDS on 04/28/17     PASRR number received on       Existing PASRR number confirmed on 04/28/17     FL2 transmitted to all facilities in geographic area requested by pt/family on       FL2 transmitted to all facilities within larger geographic area on 04/28/17     Patient informed that his/her managed care company has contracts with or will negotiate with certain facilities, including the following:        Yes   Patient/family informed of bed offers received.  Patient chooses bed at [facility]     Physician recommends and patient chooses bed at      Patient to be transferred to [facility] on 04/28/17.  Patient to be transferred to facility by [facility]     Patient family notified on 04/28/17 of transfer.  Name of family member notified:  [name]      PHYSICIAN       Additional Comment:    _______________________________________________ Geralynn Ochs, LCSW 04/28/2017, 3:17 PM

## 2017-04-28 NOTE — Progress Notes (Signed)
ANTICOAGULATION CONSULT NOTE - Follow Up Consult  Pharmacy Consult:  Coumadin Indication:  History of DVT and Protein C deficiency  No Known Allergies  Patient Measurements: Height: 5\' 2"  (157.5 cm) Weight: 218 lb 11.1 oz (99.2 kg) IBW/kg (Calculated) : 50.1  Vital Signs: Temp: 98 F (36.7 C) (11/09 1137) Temp Source: Oral (11/09 1137) BP: 145/53 (11/09 1137) Pulse Rate: 65 (11/09 1137)  Labs: Recent Labs    04/26/17 0348 04/27/17 0414 04/28/17 0436  HGB 9.0* 8.2* 8.4*  HCT 28.8* 25.9* 26.8*  PLT 255 225 249  LABPROT 24.8* 27.4* 29.6*  INR 2.26 2.58 2.84  CREATININE 4.65* 3.40* 4.80*    Estimated Creatinine Clearance: 11.1 mL/min (A) (by C-G formula based on SCr of 4.8 mg/dL (H)).    Assessment: 65 YOF with history of DVT and Protein C deficiency on Coumadin 5mg  daily PTA.  Noted multiple recent falls - CT head negative for bleed.  Patient's PO intake has been variable.  INR is therapeutic and trending up.  No bleeding reported.   Goal of Therapy:  INR 2-3 Monitor platelets by anticoagulation protocol: Yes    Plan:  Continue Coumadin 5mg  PO daily at 1800 as at home Daily PT / INR for now Monitor CBGs, abx LOT, PO intake   Nyana Haren D. Mina Marble, PharmD, BCPS Pager:  [phone] 04/28/2017, 1:15 PM

## 2017-04-28 NOTE — Progress Notes (Signed)
PT Cancellation Note  Patient Details Name: Ashlee Miles MRN: [id number] DOB: December 13, 1940   Cancelled Treatment:    Reason Eval/Treat Not Completed: Patient at procedure or test/unavailable. Pt currently in HD. Will check back if time allows.    Benjiman Core, PTA Pager [phone] Acute Rehab  Allena Katz 04/28/2017, 10:21 AM

## 2017-04-28 NOTE — Progress Notes (Signed)
Patient became very confused around 11pm on 04/27/17.  She thought she was at home after several attempts to redirect her.  She saw [facility] go past the door with a stretcher and she swore something was wrong with her daughter, [name].  I tried to redirect patient again several times, but patient just got more anxious and worried.  We finally called the daughter, [name] around 11:30pm so she could speak with her and now that she was alright.  Patient was very confused with family on the phone, just stating that, "I know I am not crazy but this makes no sense."  Daughter says this is her baseline at home and she has told the doctor about it several times.  The nurse that had her the previous night stated, "she was confused last night too."

## 2017-04-28 NOTE — Discharge Summary (Signed)
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 surrounding structures, and cervical spine, to include the craniocervical junction and cervicothoracic junction, were obtained without intravenous contrast. COMPARISON:  Head CT 04/25/2017 FINDINGS: MRI HEAD FINDINGS Brain: The midline structures are normal. There is no acute infarct or acute hemorrhage. No mass lesion, hydrocephalus, dural abnormality or extra-axial collection. Mild periventricular T2 hyperintensity. No age-advanced or lobar predominant atrophy. No chronic microhemorrhage or superficial siderosis. Vascular: Major intracranial arterial and venous sinus flow voids are preserved. Skull and upper cervical spine: The visualized skull base, calvarium, upper cervical spine and extracranial soft tissues are normal. Sinuses/Orbits: No fluid levels or advanced mucosal thickening. No mastoid or middle ear effusion. Normal orbits. MRI CERVICAL SPINE FINDINGS Alignment: Straightening of the normal cervical lordosis. No static  subluxation. Vertebrae: No fracture, evidence of discitis, or bone lesion. There is ACDF at C4-C5. Cord: No focal signal abnormality. Posterior Fossa, vertebral arteries, paraspinal tissues: Normal vertebral artery flow voids. No prevertebral soft tissue swelling. Disc levels: C1-C2: Normal. C2-C3: Normal disc space and facets. No spinal canal or neuroforaminal stenosis. C3-C4: Large disc osteophyte complex and bilateral uncovertebral hypertrophy severe spinal canal stenosis with flattening of the spinal cord. Moderate right neural foraminal stenosis. C4-C5: Postfusion changes without stenosis. C5-C6: Small disc bulge without stenosis. C6-C7: Small disc bulge without stenosis. C7-T1: Normal disc space and facets. No spinal canal or neuroforaminal stenosis. IMPRESSION: 1. Normal brain MRI for age. 2. 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 3. Moderate right C3-4 neural foraminal stenosis. Electronically Signed   By: Ulyses Jarred M.D.   On: 04/27/2017 02:27  Mr Cervical Spine Wo Contrast  Result Date: 04/27/2017 CLINICAL DATA:  Recurrent syncope EXAM: MRI HEAD WITHOUT CONTRAST MRI CERVICAL SPINE WITHOUT CONTRAST TECHNIQUE: Multiplanar, multiecho pulse sequences of the brain and surrounding structures, and cervical spine, to include the craniocervical junction and cervicothoracic junction, were obtained without intravenous contrast. COMPARISON:  Head CT 04/25/2017 FINDINGS: MRI HEAD FINDINGS Brain: The midline structures are normal. There is no acute infarct or acute hemorrhage. No mass lesion, hydrocephalus, dural abnormality or extra-axial collection. Mild periventricular T2 hyperintensity. No age-advanced or lobar predominant atrophy. No chronic microhemorrhage or superficial siderosis. Vascular: Major intracranial arterial and venous sinus flow voids are preserved. Skull and upper cervical spine: The visualized skull  base, calvarium, upper cervical spine and extracranial soft tissues are normal. Sinuses/Orbits: No fluid levels or advanced mucosal thickening. No mastoid or middle ear effusion. Normal orbits. MRI CERVICAL SPINE FINDINGS Alignment: Straightening of the normal cervical lordosis. No static subluxation. Vertebrae: No fracture, evidence of discitis, or bone lesion. There is ACDF at C4-C5. Cord: No focal signal abnormality. Posterior Fossa, vertebral arteries, paraspinal tissues: Normal vertebral artery flow voids. No prevertebral soft tissue swelling. Disc levels: C1-C2: Normal. C2-C3: Normal disc space and facets. No spinal canal or neuroforaminal stenosis. C3-C4: Large disc osteophyte complex and bilateral uncovertebral hypertrophy severe spinal canal stenosis with flattening of the spinal cord. Moderate right neural foraminal stenosis. C4-C5: Postfusion changes without stenosis. C5-C6: Small disc bulge without stenosis. C6-C7: Small disc bulge without stenosis. C7-T1: Normal disc space and facets. No spinal canal or neuroforaminal stenosis. IMPRESSION: 1. Normal brain MRI for age. 2. 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 3. Moderate right C3-4 neural foraminal stenosis. Electronically Signed   By: Ulyses Jarred M.D.   On: 04/27/2017 02:27   Dg Chest Port 1 View  Result Date: 04/25/2017 CLINICAL DATA:  Onset of mid abdominal pain. Fell from a commode 3 days ago. History of dialysis dependent renal failure, chronic CHF, diabetes. EXAM: PORTABLE CHEST 1 VIEW COMPARISON:  Chest x-ray of April 21, 2017 FINDINGS: The lungs are adequately inflated. There is no focal infiltrate. There is no pleural effusion. The cardiac silhouette is enlarged. The pulmonary vascularity is normal. There is calcification in the wall of the aortic arch. There is a catheter present via the right subclavian approach whose tip projects over the  midportion of the SVC. The bony thorax is unremarkable. IMPRESSION: Stable cardiomegaly without pulmonary vascular congestion or pulmonary edema. No acute pneumonia nor other acute cardiopulmonary abnormality. Thoracic aortic atherosclerosis. Electronically Signed   By: David  Martinique M.D.   On: 04/25/2017 14:12   Dg Chest Portable 1 View  Result Date: 04/21/2017 CLINICAL DATA:  Patient fell off commode striking right shoulder nose. Right shoulder pain. EXAM: PORTABLE CHEST 1 VIEW COMPARISON:  None. FINDINGS: The cardiopericardial silhouette is enlarged with moderate aortic atherosclerosis at the arch. No acute pulmonary consolidation, effusion or pulmonary edema. Port catheter is noted with tip in the distal SVC. Vascular stent projects over the left upper arm. ACDF of the included lower cervical spine. Subtle lucency of the distal right clavicle without displacement the suspicious for nondisplaced fracture. IMPRESSION: 1. Subtle lucency of the distal right clavicle without displacement. Findings are suspicious for nondisplaced fracture. 2. Cardiomegaly with aortic atherosclerosis. No active pulmonary disease. Electronically Signed   By: Ashley Royalty M.D.   On: 04/21/2017 20:18   Dg Shoulder Right Portable  Result Date: 04/21/2017  CLINICAL DATA:  Right shoulder pain after fall. EXAM: PORTABLE RIGHT SHOULDER COMPARISON:  None. FINDINGS: Subtle lucency of the distal right clavicle without displacement is noted similar to same day chest radiograph findings. Findings are suspicious for nondisplaced distal clavicular fracture without intra-articular involvement. AC glenohumeral joints are maintained. Port catheter is noted along the upper right chest wall with tip in the distal SVC. ACDF of the lower cervical spine. Axillary clips are seen on the right. No adjacent rib fracture. The included right lung is unremarkable. IMPRESSION: 1. Subtle lucency is noted involving the distal right clavicle suspicious for a  nondisplaced fracture. This lucency is also seen on the same day chest radiograph. 2. Intact AC and glenohumeral joints. Electronically Signed   By: Ashley Royalty M.D.   On: 04/21/2017 20:21    Microbiology: Recent Results (from the past 240 hour(s))  MRSA PCR Screening     Status: None   Collection Time: 04/26/17  5:15 AM  Result Value Ref Range Status   MRSA by PCR NEGATIVE NEGATIVE Final    Comment:        The GeneXpert MRSA Assay (FDA approved for NASAL specimens only), is one component of a comprehensive MRSA colonization surveillance program. It is not intended to diagnose MRSA infection nor to guide or monitor treatment for MRSA infections.      Labs: CBC: Recent Labs  Lab 04/21/17 2057 04/25/17 1455 04/26/17 0348 04/27/17 0414 04/28/17 0436  WBC 9.0 19.3* 18.3* 17.4* 12.4*  NEUTROABS 6.2  --  14.8* 13.6* 8.9*  HGB 10.4* 9.3* 9.0* 8.2* 8.4*  HCT 32.2* 30.1* 28.8* 25.9* 26.8*  MCV 80.3 80.9 81.1 80.7 80.5  PLT 148* 285 255 225 237   Basic Metabolic Panel: Recent Labs  Lab 04/21/17 2057 04/25/17 1145 04/26/17 0020 04/26/17 0348 04/27/17 0414 04/28/17 0436  NA 132* 138  --  139 135 134*  K 4.7 4.3  --  3.8 3.6 3.7  CL 96* 97*  --  98* 96* 95*  CO2 26 24  --  _0 GLUCOSE 233* 228*  --  173* 166* 213*  BUN 19 18  --  21* 12 24*  CREATININE 3.09* 3.99*  --  4.65* 3.40* 4.80*  CALCIUM 7.9* 7.8*  --  8.0* 8.0* 8.5*  MG  --   --  1.8  --  1.7 1.8  PHOS  --   --  4.3  --   --   --    Liver Function Tests: Recent Labs  Lab 04/21/17 2057 04/25/17 1145 04/26/17 0348 04/27/17 0414 04/28/17 0436  AST _1 ALT 11* 11* 9* 9* 9*  ALKPHOS 67 74 72 69 128*  BILITOT 0.4 0.9 0.9 0.6 0.7  PROT 6.7 6.4* 6.2* 5.6* 6.1*  ALBUMIN 3.2* 3.0* 2.7* 2.5* 2.7*   Recent Labs  Lab 04/25/17 1145  LIPASE 17   No results for input(s): AMMONIA in the last 168 hours. Cardiac Enzymes: No results for input(s): CKTOTAL, CKMB, CKMBINDEX, TROPONINI in the last  168 hours. BNP (last 3 results) Recent Labs    04/05/17 1947  BNP 216.0*   CBG: Recent Labs  Lab 04/27/17 0633 04/27/17 1156 04/27/17 1631 04/27/17 2144 04/28/17 1215  GLUCAP 176* 238* 279* 156* 97   Time spent: 35 minutes  Signed:  Natalin Bible  [facility]  04/28/2017  , 1:55 PM

## 2017-04-28 NOTE — NC FL2 (Signed)
[place] LEVEL OF CARE SCREENING TOOL     IDENTIFICATION  Patient Name: Ashlee Miles Birthdate: 22-May-1941 Sex: female Admission Date (Current Location): 04/25/2017  [facility] and [place] Number:  Herbalist and Address:  [facility], [place] [address], [place], [place] [address]      Provider Number: [id number]  Attending Physician Name and Address:  Lavina Hamman, MD  Relative Name and Phone Number:       Current Level of Care: Hospital Recommended Level of Care: [place] Prior Approval Number:    Date Approved/Denied:   PASRR Number: [id number]  Discharge Plan: SNF    Current Diagnoses: Patient Active Problem List   Diagnosis Date Noted  . 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 Falls 04/25/2017  . Hypotension 04/25/2017  . Epigastric pain 04/25/2017  . Constipation 04/25/2017  . ESRD (end stage renal disease) on dialysis ([place])   . ESRD on dialysis ([place]) 04/06/2017  . Anemia due to chronic kidney disease 04/06/2017  . Thrombocytopenia ([place]) 04/06/2017  . Cough in adult 04/06/2017  . DVT (deep vein thrombosis) in pregnancy ([place]) 04/06/2017  . DVT (deep venous thrombosis) ([place]) 04/05/2017  . Pressure injury of skin 03/14/2017  . Acute metabolic encephalopathy 78/46/9629  . Skin lesion of hand 03/12/2017  . TIA (transient ischemic attack) 03/01/2017  . Syncope 03/01/2017  . Diabetes mellitus with complication ([place]) 52/84/1324  . Essential hypertension 03/01/2017  . Chronic diastolic CHF (congestive heart failure) ([place]) 03/01/2017    Orientation RESPIRATION BLADDER Height & Weight     Self, Time, Situation  Normal Continent, External catheter(catheter placed 04/25/17) Weight: 223 lb 15.8 oz (101.6 kg) Height:  5\' 2"  (157.5 cm)  BEHAVIORAL SYMPTOMS/MOOD NEUROLOGICAL BOWEL NUTRITION STATUS      Continent    AMBULATORY STATUS COMMUNICATION OF NEEDS Skin   Limited Assist Verbally PU Stage and Appropriate  Care PU Stage 1 Dressing: No Dressing(sacrum)                     Personal Care Assistance Level of Assistance  Bathing, Dressing Bathing Assistance: Limited assistance   Dressing Assistance: Limited assistance     Functional Limitations Info  Sight, Hearing, Speech Sight Info: Impaired(Left eye redness) Hearing Info: Impaired Speech Info: Adequate    SPECIAL CARE FACTORS FREQUENCY  PT (By licensed PT), OT (By licensed OT)     PT Frequency: 5x/wk OT Frequency: 5x/wk            Contractures Contractures Info: Not present    Additional Factors Info  Code Status, Allergies, Psychotropic, Insulin Sliding Scale Code Status Info: DNR Allergies Info: NKA Psychotropic Info: Paxil 10mg  daily Insulin Sliding Scale Info: Insulin 0-9 units 3x/day with meals; 0-5 units daily at bed; Lantus 3 units daily       Current Medications (04/28/2017):  This is the current hospital active medication list Current Facility-Administered Medications  Medication Dose Route Frequency Provider Last Rate Last Dose  . 0.9 %  sodium chloride infusion  250 mL Intravenous PRN Waldemar Dickens, MD 10 mL/hr at 04/28/17 0422 250 mL at 04/28/17 0422  . aspirin EC tablet 325 mg  325 mg Oral Daily Waldemar Dickens, MD   325 mg at 04/27/17 4010  . calcitRIOL (ROCALTROL) capsule 0.5 mcg  0.5 mcg Oral Daily Waldemar Dickens, MD   0.5 mcg at 04/27/17 0929  . carvedilol (COREG) tablet 6.25 mg  6.25 mg Oral BID Marily Memos,  Grayling Congress, MD   6.25 mg at 04/27/17 2220  . cefTRIAXone (ROCEPHIN) 1 g in dextrose 5 % 50 mL IVPB  1 g Intravenous Daily Lavina Hamman, MD   Stopped at 04/27/17 1007  . famotidine (PEPCID) tablet 20 mg  20 mg Oral Daily Alvira Philips, [place]   20 mg at 04/27/17 2637  . feeding supplement (NEPRO CARB STEADY) liquid 237 mL  237 mL Oral TID BM Lavina Hamman, MD   Stopped at 04/28/17 0422  . HYDROcodone-acetaminophen (NORCO/VICODIN) 5-325 MG per tablet 1 tablet  1 tablet Oral Q4H PRN Lavina Hamman, MD   1 tablet at 04/27/17 1942  . hydrOXYzine (ATARAX/VISTARIL) tablet 25 mg  25 mg Oral Q8H PRN Waldemar Dickens, MD      . insulin aspart (novoLOG) injection 0-5 Units  0-5 Units Subcutaneous QHS Waldemar Dickens, MD   2 Units at 04/25/17 2124  . insulin aspart (novoLOG) injection 0-9 Units  0-9 Units Subcutaneous TID WC Waldemar Dickens, MD   5 Units at 04/27/17 1700  . insulin glargine (LANTUS) injection 3 Units  3 Units Subcutaneous Daily Waldemar Dickens, MD   3 Units at 04/27/17 [phone]  . ipratropium-albuterol (DUONEB) 0.5-2.5 (3) MG/3ML nebulizer solution 3 mL  3 mL Nebulization Q4H PRN Waldemar Dickens, MD      . linaclotide [facility]) capsule 145 mcg  145 mcg Oral QAC breakfast Waldemar Dickens, MD   145 mcg at 04/27/17 0929  . methocarbamol (ROBAXIN) tablet 500 mg  500 mg Oral Q8H PRN Lavina Hamman, MD      . midodrine (PROAMATINE) tablet 10 mg  10 mg Oral Q M,W,F-HD Waldemar Dickens, MD   10 mg at 04/28/17 0853  . multivitamin (RENA-VIT) tablet 1 tablet  1 tablet Oral QHS Waldemar Dickens, MD   1 tablet at 04/27/17 2220  . ondansetron (ZOFRAN) tablet 4 mg  4 mg Oral Q6H PRN Waldemar Dickens, MD       Or  . ondansetron [facility]) injection 4 mg  4 mg Intravenous Q6H PRN Waldemar Dickens, MD      . pantoprazole (PROTONIX) EC tablet 80 mg  80 mg Oral Daily Waldemar Dickens, MD   80 mg at 04/27/17 5027  . PARoxetine (PAXIL) tablet 10 mg  10 mg Oral Daily Lavina Hamman, MD      . polyethylene glycol (MIRALAX / GLYCOLAX) packet 17 g  17 g Oral Daily Lavina Hamman, MD   17 g at 04/27/17 1123  . simvastatin (ZOCOR) tablet 40 mg  40 mg Oral Daily Waldemar Dickens, MD   40 mg at 04/27/17 7412  . sodium chloride flush (NS) 0.9 % injection 10-40 mL  10-40 mL Intracatheter PRN Lavina Hamman, MD      . sodium chloride flush (NS) 0.9 % injection 3 mL  3 mL Intravenous Q12H Waldemar Dickens, MD   3 mL at 04/27/17 2304  . sodium chloride flush (NS) 0.9 % injection 3 mL  3 mL Intravenous  PRN Waldemar Dickens, MD      . sucralfate (CARAFATE) 1 GM/10ML suspension 1 g  1 g Oral TID WC & HS Waldemar Dickens, MD   1 g at 04/27/17 2220  . warfarin (COUMADIN) tablet 5 mg  5 mg Oral q1800 Laren Everts, RPH   5 mg at 04/27/17 1700  . Warfarin - Pharmacist Dosing Inpatient  Does not apply q1800 Laren Everts, [facility]         Discharge Medications: Please see discharge summary for a list of discharge medications.  Relevant Imaging Results:  Relevant Lab Results:   Additional Information SS#: [id number]; Dialysis MWF  Geralynn Ochs, LCSW

## 2017-04-28 NOTE — Care Management Note (Signed)
Case Management Note  Patient Details  Name: Ashlee Miles MRN: [id number] Date of Birth: 11-19-1940  Subjective/Objective:                    Action/Plan: Pt discharging to SNF for rehab. No further needs per CM.  Expected Discharge Date:  04/28/17               Expected Discharge Plan:  Skilled Nursing Facility  In-House Referral:  Clinical Social Work  Discharge planning Services     Post Acute Care Choice:    Choice offered to:     DME Arranged:    DME Agency:     HH Arranged:    [place] Agency:     Status of Service:  Completed, signed off  If discussed at [employer], dates discussed:    Additional Comments:  Pollie Friar, RN 04/28/2017, 12:56 PM

## 2017-04-28 NOTE — Progress Notes (Signed)
Patient ID: Ashlee Miles, female   DOB: [birth year], 76 y.o.   MRN: [id number]  [facility] Progress Note   Assessment/ Plan:   1. Deconditioning/altered mental status: mobility impaired especially after she suffered her DVT and it is suspected that she has continued to become further deconditioned with falls at home. CT scan of the head negative for any acute intracranial pathology including subdural hematoma. Would recommend getting psychiatry consultation at this time.  2. End-stage renal disease:continue hemodialysis on a Monday/Wednesday/Friday schedule. Using her left femoral loop graft without problems. Discussed need to limit fluid intake with her hyponatremia and current volume status.  3. Hypotension/volume: continue midodrine for blood pressure support and efforts at ultrafiltration/lowering dry weight with hemodialysis with evidence of some asymmetric LE edema on exam. 4. Anemia of chronic kidney disease: continue ESA and intravenous iron for anemia management. 5. Secondary hyperparathyroidism: Continue Hectorol for PTH suppression and renal diet with phosphorus binders. 6. Cough, sputum production: resoved at this time. No evidence of PNA.  7. Epigastric/abdominal pain: CT scan of the abdomen showed diffuse gastric wall thickening with mild surrounding inflammatory changes concerning for gastritis versus lymphomatous infiltration-endoscopy will be done as an outpatient when patient recovered from her acute hospitalization. 8. Right leg DVT: Ongoing anticoagulation with warfarin and with IVC filter in situ.  Subjective:   Episode with confusion/?delusion noted overnight. "my daughter thinks I am going crazy". Still having MSK pain over sites of her fall related injuries.    Objective:   BP (!) 154/62   Pulse 70   Temp 97.8 F (36.6 C) (Oral)   Resp 18   Ht 5\' 2"  (1.575 m)   Wt 101.6 kg (223 lb 15.8 oz)   SpO2 100%   BMI 40.97 kg/m   Physical Exam: NKN:[id number]  fairly comfortable resting in dialysis LPF:[id number] regular rhythm, normal rate, S1 and S2 normal Resp: anteriorly clear breath sounds. No rales/rhonchi Abd: soft, obese, non tender, bowel sounds normal Ext: 1+ right lower extremity edema, trace left lower extremity edema  Labs: BMET Recent Labs  Lab 04/21/17 2057 04/25/17 1145 04/26/17 0020 04/26/17 0348 04/27/17 0414 04/28/17 0436  NA 132* 138  --  139 135 134*  K 4.7 4.3  --  3.8 3.6 3.7  CL 96* 97*  --  98* 96* 95*  CO2 26 24  --  30 30 28   GLUCOSE 233* 228*  --  173* 166* 213*  BUN 19 18  --  21* 12 24*  CREATININE 3.09* 3.99*  --  4.65* 3.40* 4.80*  CALCIUM 7.9* 7.8*  --  8.0* 8.0* 8.5*  PHOS  --   --  4.3  --   --   --    CBC Recent Labs  Lab 04/21/17 2057 04/25/17 1455 04/26/17 0348 04/27/17 0414 04/28/17 0436  WBC 9.0 19.3* 18.3* 17.4* 12.4*  NEUTROABS 6.2  --  14.8* 13.6* 8.9*  HGB 10.4* 9.3* 9.0* 8.2* 8.4*  HCT 32.2* 30.1* 28.8* 25.9* 26.8*  MCV 80.3 80.9 81.1 80.7 80.5  PLT 148* 285 255 225 249    Medications:    . aspirin  325 mg Oral Daily  . calcitRIOL  0.5 mcg Oral Daily  . carvedilol  6.25 mg Oral BID  . famotidine  20 mg Oral Daily  . feeding supplement (NEPRO CARB STEADY)  237 mL Oral TID BM  . insulin aspart  0-5 Units Subcutaneous QHS  . insulin aspart  0-9 Units Subcutaneous TID WC  . insulin  glargine  3 Units Subcutaneous Daily  . linaclotide  145 mcg Oral QAC breakfast  . midodrine  10 mg Oral Q M,W,F-HD  . multivitamin  1 tablet Oral QHS  . pantoprazole  80 mg Oral Daily  . PARoxetine  10 mg Oral Daily  . polyethylene glycol  17 g Oral Daily  . simvastatin  40 mg Oral Daily  . sodium chloride flush  3 mL Intravenous Q12H  . sucralfate  1 g Oral TID WC & HS  . warfarin  5 mg Oral q1800  . Warfarin - Pharmacist Dosing Inpatient   Does not apply [id number]   Elmarie Shiley, MD 04/28/2017, 8:57 AM

## 2017-04-28 NOTE — Progress Notes (Signed)
Inpatient Diabetes Program Recommendations  AACE/ADA: New Consensus Statement on Inpatient Glycemic Control (2015)  Target Ranges:  Prepandial:   less than 140 mg/dL      Peak postprandial:   less than 180 mg/dL (1-2 hours)      Critically ill patients:  140 - 180 mg/dL   Lab Results  Component Value Date   GLUCAP 156 (H) 04/27/2017   HGBA1C 7.3 (H) 03/14/2017    Please change diet to Carbohydrate modified.  Thanks,  Tama Headings RN, MSN, [facility] Inpatient Diabetes Coordinator Team Pager [phone] (8a-5p)

## 2017-04-28 NOTE — Progress Notes (Signed)
Pt d/c from hospital, pt has no new concerns, alert and oriented, pt is stable. Nurse called [facility] to give report but was unable to reach receiving RN, Message  And phone number left with Dyann Ruddle to have nurse call back. Pt will be transported from the hospital by [facility]

## 2017-04-28 NOTE — Procedures (Signed)
Patient seen on Hemodialysis. QB 400, UF goal 1.5L (net) Treatment adjusted as needed.  Ashlee Shiley MD [facility]. Office # [phone] Pager # [phone] 8:57 AM

## 2017-05-01 LAB — H PYLORI, IGM, IGG, IGA AB
H Pylori IgG: <0.8 Index Value (ref 0.00–0.79)
H. Pylogi, Iga Abs: 9.8 units — ABNORMAL HIGH (ref 0.0–8.9)
H. Pylogi, Igm Abs: <9 units (ref 0.0–8.9)

## 2017-05-02 ENCOUNTER — Telehealth: Payer: Self-pay | Admitting: Internal Medicine

## 2017-05-02 NOTE — Telephone Encounter (Signed)
[facility] TELEPHONE ENCOUNTER NOTE  Patient: Ashlee Miles UPJ:[id number]   PCP: Darlina Rumpf, MD DOB: 05-27-1941     DOS: 05/02/2017   Patient's H. pylori serology came back equivocal for IgA, IgM and IgG were negative.  Given that the patient presented with gastritis symptoms I recommend to treat the patient with full dose of triple therapy.  This was informed with the nurse in charge of the patient at the SNF at [place].   Author: Berle Mull, MD [facility] Pager: [phone] 05/02/2017 8:45 AM

## 2017-05-09 ENCOUNTER — Encounter (HOSPITAL_COMMUNITY): Payer: Self-pay | Admitting: Internal Medicine

## 2017-05-09 ENCOUNTER — Inpatient Hospital Stay (HOSPITAL_COMMUNITY)
Admission: EM | Admit: 2017-05-09 | Discharge: 2017-05-13 | DRG: 252 | Disposition: A | Discharge status: Skilled Nursing Facility | Payer: 59 | Attending: Family Medicine | Admitting: Family Medicine

## 2017-05-09 DIAGNOSIS — E1122 Type 2 diabetes mellitus with diabetic chronic kidney disease: Secondary | ICD-10-CM | POA: Diagnosis present

## 2017-05-09 DIAGNOSIS — E54 Ascorbic acid deficiency: Secondary | ICD-10-CM | POA: Diagnosis present

## 2017-05-09 DIAGNOSIS — Z7982 Long term (current) use of aspirin: Secondary | ICD-10-CM

## 2017-05-09 DIAGNOSIS — D6859 Other primary thrombophilia: Secondary | ICD-10-CM | POA: Diagnosis present

## 2017-05-09 DIAGNOSIS — Z66 Do not resuscitate: Secondary | ICD-10-CM | POA: Diagnosis present

## 2017-05-09 DIAGNOSIS — M898X9 Other specified disorders of bone, unspecified site: Secondary | ICD-10-CM | POA: Diagnosis present

## 2017-05-09 DIAGNOSIS — M7989 Other specified soft tissue disorders: Secondary | ICD-10-CM

## 2017-05-09 DIAGNOSIS — S0012XD Contusion of left eyelid and periocular area, subsequent encounter: Secondary | ICD-10-CM

## 2017-05-09 DIAGNOSIS — E118 Type 2 diabetes mellitus with unspecified complications: Secondary | ICD-10-CM | POA: Diagnosis present

## 2017-05-09 DIAGNOSIS — I82409 Acute embolism and thrombosis of unspecified deep veins of unspecified lower extremity: Secondary | ICD-10-CM | POA: Diagnosis present

## 2017-05-09 DIAGNOSIS — Z419 Encounter for procedure for purposes other than remedying health state, unspecified: Secondary | ICD-10-CM

## 2017-05-09 DIAGNOSIS — I5032 Chronic diastolic (congestive) heart failure: Secondary | ICD-10-CM | POA: Diagnosis present

## 2017-05-09 DIAGNOSIS — Z86718 Personal history of other venous thrombosis and embolism: Secondary | ICD-10-CM

## 2017-05-09 DIAGNOSIS — N186 End stage renal disease: Secondary | ICD-10-CM | POA: Diagnosis present

## 2017-05-09 DIAGNOSIS — Z992 Dependence on renal dialysis: Secondary | ICD-10-CM

## 2017-05-09 DIAGNOSIS — T82590A Other mechanical complication of surgically created arteriovenous fistula, initial encounter: Secondary | ICD-10-CM

## 2017-05-09 DIAGNOSIS — T82838A Hemorrhage of vascular prosthetic devices, implants and grafts, initial encounter: Secondary | ICD-10-CM | POA: Diagnosis not present

## 2017-05-09 DIAGNOSIS — D631 Anemia in chronic kidney disease: Secondary | ICD-10-CM | POA: Diagnosis present

## 2017-05-09 DIAGNOSIS — I10 Essential (primary) hypertension: Secondary | ICD-10-CM | POA: Diagnosis present

## 2017-05-09 DIAGNOSIS — Z794 Long term (current) use of insulin: Secondary | ICD-10-CM

## 2017-05-09 DIAGNOSIS — W1811XD Fall from or off toilet without subsequent striking against object, subsequent encounter: Secondary | ICD-10-CM

## 2017-05-09 DIAGNOSIS — N189 Chronic kidney disease, unspecified: Secondary | ICD-10-CM | POA: Diagnosis present

## 2017-05-09 DIAGNOSIS — Z79899 Other long term (current) drug therapy: Secondary | ICD-10-CM

## 2017-05-09 DIAGNOSIS — Z8249 Family history of ischemic heart disease and other diseases of the circulatory system: Secondary | ICD-10-CM

## 2017-05-09 DIAGNOSIS — I132 Hypertensive heart and chronic kidney disease with heart failure and with stage 5 chronic kidney disease, or end stage renal disease: Secondary | ICD-10-CM | POA: Diagnosis present

## 2017-05-09 DIAGNOSIS — S0011XD Contusion of right eyelid and periocular area, subsequent encounter: Secondary | ICD-10-CM

## 2017-05-09 LAB — CBC WITH DIFFERENTIAL/PLATELET
Basophils Absolute: 0 10*3/uL (ref 0.0–0.1)
Basophils Relative: 1 %
Eosinophils Absolute: 0.4 10*3/uL (ref 0.0–0.7)
Eosinophils Relative: 6 %
HCT: 28.6 % — ABNORMAL LOW (ref 36.0–46.0)
Hemoglobin: 9.2 g/dL — ABNORMAL LOW (ref 12.0–15.0)
Lymphocytes Relative: 28 %
Lymphs Abs: 1.8 10*3/uL (ref 0.7–4.0)
MCH: 24.9 pg — ABNORMAL LOW (ref 26.0–34.0)
MCHC: 32.2 g/dL (ref 30.0–36.0)
MCV: 77.5 fL — ABNORMAL LOW (ref 78.0–100.0)
Monocytes Absolute: 0.6 10*3/uL (ref 0.1–1.0)
Monocytes Relative: 9 %
Neutro Abs: 3.6 10*3/uL (ref 1.7–7.7)
Neutrophils Relative %: 56 %
Platelets: 311 10*3/uL (ref 150–400)
RBC: 3.69 MIL/uL — ABNORMAL LOW (ref 3.87–5.11)
RDW: 16 % — ABNORMAL HIGH (ref 11.5–15.5)
WBC: 6.4 10*3/uL (ref 4.0–10.5)

## 2017-05-09 LAB — COMPREHENSIVE METABOLIC PANEL
ALT: 9 U/L — ABNORMAL LOW (ref 14–54)
AST: 17 U/L (ref 15–41)
Albumin: 3.1 g/dL — ABNORMAL LOW (ref 3.5–5.0)
Alkaline Phosphatase: 77 U/L (ref 38–126)
Anion gap: 11 (ref 5–15)
BUN: 44 mg/dL — ABNORMAL HIGH (ref 6–20)
CO2: 25 mmol/L (ref 22–32)
Calcium: 8.3 mg/dL — ABNORMAL LOW (ref 8.9–10.3)
Chloride: 94 mmol/L — ABNORMAL LOW (ref 101–111)
Creatinine, Ser: 5.53 mg/dL — ABNORMAL HIGH (ref 0.44–1.00)
GFR calc Af Amer: 8 mL/min — ABNORMAL LOW (ref >60)
GFR calc non Af Amer: 7 mL/min — ABNORMAL LOW (ref >60)
Glucose, Bld: 344 mg/dL — ABNORMAL HIGH (ref 65–99)
Potassium: 4.1 mmol/L (ref 3.5–5.1)
Sodium: 130 mmol/L — ABNORMAL LOW (ref 135–145)
Total Bilirubin: 0.5 mg/dL (ref 0.3–1.2)
Total Protein: 6.3 g/dL — ABNORMAL LOW (ref 6.5–8.1)

## 2017-05-09 LAB — CBG MONITORING, ED: Glucose-Capillary: 384 mg/dL — ABNORMAL HIGH (ref 65–99)

## 2017-05-09 LAB — PROTIME-INR
INR: 2.04
Prothrombin Time: 22.9 seconds — ABNORMAL HIGH (ref 11.4–15.2)

## 2017-05-09 MED ORDER — NEPRO/CARBSTEADY PO LIQD
237.0000 mL | Freq: Three times a day (TID) | ORAL | Status: DC
  Filled 2017-05-09 (×15): qty 237

## 2017-05-09 MED ORDER — SUCRALFATE 1 GM/10ML PO SUSP
1.0000 g | Freq: Three times a day (TID) | ORAL | Status: DC
  Administered 2017-05-11 – 2017-05-13 (×8): 1 g via ORAL
  Filled 2017-05-09 (×9): qty 10

## 2017-05-09 MED ORDER — HYDROCODONE-ACETAMINOPHEN 5-325 MG PO TABS
1.0000 | ORAL_TABLET | Freq: Three times a day (TID) | ORAL | Status: DC | PRN
  Administered 2017-05-10 – 2017-05-13 (×7): 1 via ORAL
  Filled 2017-05-09 (×7): qty 1

## 2017-05-09 MED ORDER — ONDANSETRON HCL 4 MG/2ML IJ SOLN: 4.0000 mg | Freq: Four times a day (QID) | INTRAMUSCULAR | Status: DC | PRN

## 2017-05-09 MED ORDER — FAMOTIDINE 20 MG PO TABS
20.0000 mg | ORAL_TABLET | Freq: Every day | ORAL | Status: DC
  Administered 2017-05-10 – 2017-05-13 (×4): 20 mg via ORAL
  Filled 2017-05-09 (×4): qty 1

## 2017-05-09 MED ORDER — HYDROXYZINE HCL 25 MG PO TABS: 25.0000 mg | ORAL_TABLET | Freq: Three times a day (TID) | ORAL | Status: DC | PRN

## 2017-05-09 MED ORDER — MIDODRINE HCL 5 MG PO TABS
10.0000 mg | ORAL_TABLET | ORAL | Status: DC
  Administered 2017-05-10 – 2017-05-12 (×2): 10 mg via ORAL
  Filled 2017-05-09 (×3): qty 2

## 2017-05-09 MED ORDER — SIMVASTATIN 40 MG PO TABS
40.0000 mg | ORAL_TABLET | Freq: Every day | ORAL | Status: DC
Start: 2017-05-10 — End: 2017-05-13
  Administered 2017-05-10 – 2017-05-13 (×4): 40 mg via ORAL
  Filled 2017-05-09 (×4): qty 1

## 2017-05-09 MED ORDER — SODIUM CHLORIDE 0.9% FLUSH
10.0000 mL | INTRAVENOUS | Status: DC | PRN
  Administered 2017-05-10: 10 mL
  Filled 2017-05-09: qty 40

## 2017-05-09 MED ORDER — RENA-VITE PO TABS
1.0000 | ORAL_TABLET | Freq: Every day | ORAL | Status: DC
  Administered 2017-05-10 – 2017-05-12 (×3): 1 via ORAL
  Filled 2017-05-09 (×3): qty 1

## 2017-05-09 MED ORDER — CARVEDILOL 6.25 MG PO TABS
6.2500 mg | ORAL_TABLET | Freq: Two times a day (BID) | ORAL | Status: DC
  Administered 2017-05-10 – 2017-05-13 (×7): 6.25 mg via ORAL
  Filled 2017-05-09 (×7): qty 1

## 2017-05-09 MED ORDER — LINACLOTIDE 145 MCG PO CAPS
145.0000 ug | ORAL_CAPSULE | Freq: Every day | ORAL | Status: DC
  Administered 2017-05-10 – 2017-05-13 (×4): 145 ug via ORAL
  Filled 2017-05-09 (×4): qty 1

## 2017-05-09 MED ORDER — ONDANSETRON HCL 4 MG PO TABS: 4.0000 mg | ORAL_TABLET | Freq: Four times a day (QID) | ORAL | Status: DC | PRN

## 2017-05-09 MED ORDER — IPRATROPIUM-ALBUTEROL 0.5-2.5 (3) MG/3ML IN SOLN: 3.0000 mL | RESPIRATORY_TRACT | Status: DC | PRN

## 2017-05-09 MED ORDER — PRO-STAT SUGAR FREE PO LIQD
30.0000 mL | Freq: Two times a day (BID) | ORAL | Status: DC
  Administered 2017-05-11: 30 mL via ORAL
  Filled 2017-05-09 (×4): qty 30

## 2017-05-09 MED ORDER — ACETAMINOPHEN 650 MG RE SUPP: 650.0000 mg | Freq: Four times a day (QID) | RECTAL | Status: DC | PRN

## 2017-05-09 MED ORDER — DOCUSATE SODIUM 100 MG PO CAPS
100.0000 mg | ORAL_CAPSULE | Freq: Two times a day (BID) | ORAL | Status: DC
  Administered 2017-05-10 – 2017-05-12 (×5): 100 mg via ORAL
  Filled 2017-05-09 (×6): qty 1

## 2017-05-09 MED ORDER — METHOCARBAMOL 500 MG PO TABS
500.0000 mg | ORAL_TABLET | Freq: Three times a day (TID) | ORAL | Status: DC | PRN
  Administered 2017-05-10 – 2017-05-11 (×2): 500 mg via ORAL
  Filled 2017-05-09 (×2): qty 1

## 2017-05-09 MED ORDER — PAROXETINE HCL 20 MG PO TABS
10.0000 mg | ORAL_TABLET | Freq: Every day | ORAL | Status: DC
  Administered 2017-05-10 – 2017-05-13 (×4): 10 mg via ORAL
  Filled 2017-05-09 (×4): qty 1

## 2017-05-09 MED ORDER — FENTANYL CITRATE (PF) 100 MCG/2ML IJ SOLN
50.0000 ug | Freq: Once | INTRAMUSCULAR | Status: AC
  Administered 2017-05-09: 50 ug via INTRAVENOUS
  Filled 2017-05-09: qty 2

## 2017-05-09 MED ORDER — CALCITRIOL 0.5 MCG PO CAPS
0.5000 ug | ORAL_CAPSULE | Freq: Every day | ORAL | Status: DC
  Administered 2017-05-10 – 2017-05-13 (×4): 0.5 ug via ORAL
  Filled 2017-05-09 (×4): qty 1

## 2017-05-09 MED ORDER — PANTOPRAZOLE SODIUM 40 MG PO TBEC
40.0000 mg | DELAYED_RELEASE_TABLET | Freq: Two times a day (BID) | ORAL | Status: DC
  Administered 2017-05-10 – 2017-05-13 (×7): 40 mg via ORAL
  Filled 2017-05-09 (×7): qty 1

## 2017-05-09 MED ORDER — ACETAMINOPHEN 325 MG PO TABS
650.0000 mg | ORAL_TABLET | Freq: Four times a day (QID) | ORAL | Status: DC | PRN
  Administered 2017-05-12: 650 mg via ORAL

## 2017-05-09 MED ORDER — INSULIN ASPART 100 UNIT/ML ~~LOC~~ SOLN
0.0000 [IU] | Freq: Three times a day (TID) | SUBCUTANEOUS | Status: DC
  Administered 2017-05-10 (×2): 3 [IU] via SUBCUTANEOUS
  Administered 2017-05-11: 5 [IU] via SUBCUTANEOUS
  Administered 2017-05-11: 3 [IU] via SUBCUTANEOUS
  Administered 2017-05-11 – 2017-05-12 (×2): 2 [IU] via SUBCUTANEOUS
  Administered 2017-05-12: 5 [IU] via SUBCUTANEOUS
  Administered 2017-05-13: 3 [IU] via SUBCUTANEOUS

## 2017-05-09 MED ORDER — POLYETHYLENE GLYCOL 3350 17 G PO PACK
17.0000 g | PACK | Freq: Every day | ORAL | Status: DC
  Administered 2017-05-12: 17 g via ORAL
  Filled 2017-05-09 (×2): qty 1

## 2017-05-09 NOTE — ED Triage Notes (Signed)
Pt arrived from [facility] c/o left leg pain. Pt states her pain has increased her dialysis session on Sunday. EMS stated her leg has swelling and feels hot to touch. Pt is alert and oriented x4. Afebrile upon arrival. EMS v/s: 142/80, hr 88, rr18, cbg 437.

## 2017-05-09 NOTE — ED Notes (Signed)
IV team at bedside for port access and blood draw

## 2017-05-09 NOTE — ED Provider Notes (Signed)
[facility] EMERGENCY DEPARTMENT Provider Note  CSN: [id number] Arrival date & time: 05/09/17 1541  Chief Complaint(s) Leg Pain  HPI Ashlee Miles is a 76 y.o. 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  She presents today with left leg pain that began following a dialysis session on Sunday after she was cannulated.  She reports that since then her leg has been progressively more tender and swollen with ecchymosis.  Pain is exacerbated with movement and palpation of the left thigh.  Alleviated by being still.  She denies any associated trauma, injuries, or falls.  No chest pain, shortness of breath, nausea, vomiting, abdominal pain.  No fevers or recent infections.  She denies any other physical complaints.  HPI  Past Medical History Past Medical History:  Diagnosis Date  . CHF (congestive heart failure) ([place])   . Diabetes mellitus without complication ([place])   . ESRD (end stage renal disease) on dialysis ([place])   . History of pituitary adenoma   . Hypertension   . Protein C deficiency [facility])    Patient Active Problem List   Diagnosis Date Noted  . Left leg swelling 05/09/2017  . 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 Falls 04/25/2017  . Hypotension 04/25/2017  . Epigastric pain 04/25/2017  . Constipation 04/25/2017  . ESRD (end stage renal disease) on dialysis ([place])   . ESRD on dialysis ([place]) 04/06/2017  . Anemia due to chronic kidney disease 04/06/2017  . Thrombocytopenia ([place]) 04/06/2017  . Cough in adult 04/06/2017  . DVT (deep vein thrombosis) in pregnancy ([place]) 04/06/2017  . DVT (deep venous thrombosis) ([place]) 04/05/2017  . Pressure injury of skin 03/14/2017  . Acute metabolic encephalopathy 57/84/6962  . Skin lesion of hand 03/12/2017  . TIA (transient ischemic attack) 03/01/2017  .  Syncope 03/01/2017  . Diabetes mellitus with complication ([place]) 95/28/4132  . Essential hypertension 03/01/2017  . Chronic diastolic CHF (congestive heart failure) ([place]) 03/01/2017   Home Medication(s) Prior to Admission medications   Medication Sig Start Date End Date Taking? Authorizing Provider  acetaminophen (TYLENOL) 325 MG tablet Take 650 mg by mouth every 6 (six) hours as needed for mild pain.   Yes [provider]  Amino Acids-Protein Hydrolys (FEEDING SUPPLEMENT, PRO-STAT SUGAR FREE 64,) LIQD Take 30 mLs by mouth 2 (two) times daily. 04/12/17  Yes Sheikh, Omair Latif, DO  amoxicillin (AMOXIL) 500 MG tablet Take 1,000 mg by mouth 2 (two) times daily. FOR 14 DAYS   Yes [provider]  aspirin 325 MG EC tablet Take 325 mg daily by mouth.   Yes [provider]  calcitRIOL (ROCALTROL) 0.5 MCG capsule Take 0.5 mcg by mouth daily.   Yes [provider]  camphor-menthol Timoteo Ace) lotion Apply 1 application topically every 8 (eight) hours as needed for itching. 04/12/17  Yes Sheikh, Omair Latif, DO  carvedilol (COREG) 6.25 MG tablet Take 1 tablet (6.25 mg total) by mouth 2 (two) times daily. 03/04/17 03/04/18 Yes Hosie Poisson, MD  clarithromycin (BIAXIN) 500 MG tablet Take 500 mg by mouth 2 (two) times daily. FOR 14 DAYS 05/02/17 05/16/17 Yes [provider]  docusate sodium (COLACE) 100 MG capsule Take 1 capsule (100 mg total) by mouth 2 (two) times daily. 04/12/17  Yes Sheikh, Omair Latif, DO  famotidine (PEPCID) 20 MG tablet Take  1 tablet (20 mg total) daily by mouth. 04/28/17  Yes Lavina Hamman, MD  HYDROcodone-acetaminophen (NORCO/VICODIN) 5-325 MG tablet Take 1 tablet 3 (three) times daily as needed by mouth for severe pain. Patient taking differently: Take 1 tablet by mouth every 8 (eight) hours as needed (for pain).  04/28/17  Yes Lavina Hamman, MD  hydrOXYzine (ATARAX/VISTARIL) 25 MG tablet Take 1 tablet (25 mg total) by mouth every 8 (eight)  hours as needed for itching. 04/12/17  Yes Sheikh, Omair Latif, DO  insulin aspart (NOVOLOG) 100 UNIT/ML injection Inject 0-9 Units into the skin 3 (three) times daily with meals. Patient taking differently: Inject 0-9 Units into the skin 3 (three) times daily with meals. Per sliding scale: BGL 110-149 = 0 units; 150-200 = 2 units; 201-250 = 4 units; 251-300 = 6 units; 301-350 = 8 units; 351-400 = 9 units; 401-450 = CALL MD 03/22/17  Yes Debbe Odea, MD  ipratropium-albuterol (DUONEB) 0.5-2.5 (3) MG/3ML SOLN Take 3 mLs by nebulization every 4 (four) hours as needed. Patient taking differently: Take 3 mLs by nebulization every 4 (four) hours as needed (for shortness of breath).  04/12/17  Yes Sheikh, Omair Latif, DO  linaclotide (LINZESS) 145 MCG CAPS capsule Take 145 mcg by mouth daily before breakfast.   Yes [provider]  methocarbamol (ROBAXIN) 500 MG tablet Take 1 tablet (500 mg total) every 8 (eight) hours as needed by mouth for muscle spasms. 04/28/17  Yes Lavina Hamman, MD  midodrine (PROAMATINE) 10 MG tablet Take 1 tablet (10 mg total) by mouth every Monday, Wednesday, and Friday with hemodialysis. Patient taking differently: Take 10 mg by mouth every Monday, Wednesday, and Friday.  04/14/17  Yes Sheikh, Omair Latif, DO  multivitamin (RENA-VIT) TABS tablet Take 1 tablet by mouth at bedtime. 04/12/17  Yes Sheikh, Omair Latif, DO  Nutritional Supplements (FEEDING SUPPLEMENT, NEPRO CARB STEADY,) LIQD Take 237 mLs by mouth 3 (three) times daily as needed (Supplement). Patient taking differently: Take 237 mLs by mouth 3 (three) times daily.  04/12/17  Yes Sheikh, Omair Latif, DO  pantoprazole (PROTONIX) 40 MG tablet Take 1 tablet (40 mg total) 2 (two) times daily before a meal by mouth. 04/28/17  Yes Lavina Hamman, MD  PARoxetine (PAXIL) 10 MG tablet Take 1 tablet (10 mg total) daily by mouth. 04/28/17  Yes Lavina Hamman, MD  polyethylene glycol [facility] / GLYCOLAX) packet Take 17 g  daily by mouth. 04/28/17  Yes Lavina Hamman, MD  sucralfate (CARAFATE) 1 GM/10ML suspension Take 10 mLs (1 g total) 4 (four) times daily -  with meals and at bedtime by mouth. Patient taking differently: Take 1 g by mouth 4 (four) times daily. WITH A MEAL OR SNACK 04/28/17  Yes Lavina Hamman, MD  triamcinolone cream (KENALOG) 0.1 % Apply 1 application topically 2 (two) times daily. APPLY TO HAND(S) AS DIRECTED   Yes [provider]  warfarin (COUMADIN) 4 MG tablet Take 4 mg by mouth daily.   Yes [provider]  simvastatin (ZOCOR) 40 MG tablet Take 1 tablet (40 mg total) by mouth daily. 03/05/17   Hosie Poisson, MD  warfarin (COUMADIN) 5 MG tablet Take 1 tablet (5 mg total) by mouth daily at 6 PM. Patient not taking: Reported on 05/09/2017 04/12/17   Kerney Elbe, DO  Past Surgical History Past Surgical History:  Procedure Laterality Date  . AV FISTULA PLACEMENT Left 03/16/2017   Procedure: INSERTION OF ARTERIOVENOUS (AV) GORE-TEX GRAFT THIGH-LEFT;  Surgeon: Angelia Mould, MD;  Location: [place];  Service: Vascular;  Laterality: Left;  . CHOLECYSTECTOMY    . DIALYSIS FISTULA CREATION     clotted off  . INSERTION OF DIALYSIS CATHETER Right 03/16/2017   Procedure: INSERTION OF RIGHT FEMORAL TUNNELED DIALYSIS CATHETER;  Surgeon: Angelia Mould, MD;  Location: [place];  Service: Vascular;  Laterality: Right;  . IVC FILTER INSERTION    . PORTACATH PLACEMENT     Family History Family History  Problem Relation Age of Onset  . Breast cancer Mother   . Diabetes Sister   . Diabetes Brother   . CAD Other   . Stroke Neg Hx     Social History Social History   Tobacco Use  . Smoking status: Never Smoker  . Smokeless tobacco: Never Used  Substance Use Topics  . Alcohol use: No  . Drug use: No   Allergies Patient has no  known allergies.  Review of Systems Review of Systems All other systems are reviewed and are negative for acute change except as noted in the HPI  Physical Exam Vital Signs  I have reviewed the triage vital signs BP (!) 154/53 (BP Location: Left Wrist)   Pulse 70   Temp 97.9 F (36.6 C) (Oral)   Resp 15   SpO2 100%   Physical Exam  Constitutional:  Obese  Cardiovascular:  Pulses:      Dorsalis pedis pulses are 2+ on the right side, and 2+ on the left side.  Musculoskeletal:       Legs:   ED Results and Treatments Labs (all labs ordered are listed, but only abnormal results are displayed) Labs Reviewed  CBC WITH DIFFERENTIAL/PLATELET - Abnormal; Notable for the following components:      Result Value   RBC 3.69 (*)    Hemoglobin 9.2 (*)    HCT 28.6 (*)    MCV 77.5 (*)    MCH 24.9 (*)    RDW 16.0 (*)    All other components within normal limits  COMPREHENSIVE METABOLIC PANEL - Abnormal; Notable for the following components:   Sodium 130 (*)    Chloride 94 (*)    Glucose, Bld 344 (*)    BUN 44 (*)    Creatinine, Ser 5.53 (*)    Calcium 8.3 (*)    Total Protein 6.3 (*)    Albumin 3.1 (*)    ALT 9 (*)    GFR calc non Af Amer 7 (*)    GFR calc Af Amer 8 (*)    All other components within normal limits  PROTIME-INR - Abnormal; Notable for the following components:   Prothrombin Time 22.9 (*)    All other components within normal limits  CBG MONITORING, ED - Abnormal; Notable for the following components:   Glucose-Capillary 384 (*)    All other components within normal limits  BASIC METABOLIC PANEL  CBC  CBC  CBC  PROTIME-INR  EKG  EKG Interpretation  Date/Time:    Ventricular Rate:    PR Interval:    QRS Duration:   QT Interval:    QTC Calculation:   R Axis:     Text Interpretation:        Radiology No results found. Pertinent  labs & imaging results that were available during my care of the patient were reviewed by me and considered in my medical decision making (see chart for details).  Medications Ordered in ED Medications  sodium chloride flush (NS) 0.9 % injection 10-40 mL (not administered)  feeding supplement (PRO-STAT SUGAR FREE 64) liquid 30 mL (not administered)  calcitRIOL (ROCALTROL) capsule 0.5 mcg (not administered)  carvedilol (COREG) tablet 6.25 mg (not administered)  docusate sodium (COLACE) capsule 100 mg (not administered)  famotidine (PEPCID) tablet 20 mg (not administered)  HYDROcodone-acetaminophen (NORCO/VICODIN) 5-325 MG per tablet 1 tablet (not administered)  hydrOXYzine (ATARAX/VISTARIL) tablet 25 mg (not administered)  ipratropium-albuterol (DUONEB) 0.5-2.5 (3) MG/3ML nebulizer solution 3 mL (not administered)  linaclotide (LINZESS) capsule 145 mcg (not administered)  methocarbamol (ROBAXIN) tablet 500 mg (not administered)  midodrine (PROAMATINE) tablet 10 mg (not administered)  multivitamin (RENA-VIT) tablet 1 tablet (not administered)  feeding supplement (NEPRO CARB STEADY) liquid 237 mL (not administered)  pantoprazole (PROTONIX) EC tablet 40 mg (not administered)  PARoxetine (PAXIL) tablet 10 mg (not administered)  polyethylene glycol (MIRALAX / GLYCOLAX) packet 17 g (not administered)  simvastatin (ZOCOR) tablet 40 mg (not administered)  sucralfate (CARAFATE) 1 GM/10ML suspension 1 g (not administered)  acetaminophen (TYLENOL) tablet 650 mg (not administered)    Or  acetaminophen (TYLENOL) suppository 650 mg (not administered)  ondansetron (ZOFRAN) tablet 4 mg (not administered)    Or  ondansetron (ZOFRAN) injection 4 mg (not administered)  insulin aspart (novoLOG) injection 0-9 Units (not administered)  fentaNYL (SUBLIMAZE) injection 50 mcg (50 mcg Intravenous Given 05/09/17 1820)                                                                                                                                     Procedures Procedures  (including critical care time)  Medical Decision Making / ED Course I have reviewed the nursing notes for this encounter and the patient's prior records (if available in EHR or on provided paperwork).     Presentation is concerning for pseudoaneurysm of the AV fistula.  Will obtain screening labs to assess for anemia.  We will also obtain a PT/INR.  Will need an ultrasound to assess the AV fistula and surrounding soft tissues to assess for pseudoaneurysm or expanding hematoma.  Patient provided with IV pain medication.  Unable to get vascular ultrasound.  Case was discussed with Dr. Trula Slade, of vascular surgery who agreed on admission for trending hemoglobins with ultrasound in the morning.  He will evaluate the patient in the a.m. to assess for any need for surgery.  Case discussed with Dr. Hal Hope  from the hospitalist service who admitted the patient  Final Clinical Impression(s) / ED Diagnoses Final diagnoses:  Dialysis AV fistula malfunction, initial encounter [facility])      This chart was dictated using voice recognition software.  Despite best efforts to proofread,  errors can occur which can change the documentation meaning.   Fatima Blank, MD 05/09/17 [phone]

## 2017-05-09 NOTE — H&P (Signed)
History and Physical    Ashlee Miles HUD:[id number] DOB: 1941/04/01 DOA: 05/09/2017  PCP: Ashlee Rumpf, MD  Patient coming from: [place].  Chief Complaint: Left groin pain.  HPI: Ashlee Miles is a 76 y.o. female with history of ESRD on hemodialysis, diabetes mellitus type 2, protein C deficiency and DVT on Coumadin, chronic anemia, history of CHF was recently admitted for abdominal pain likely from gastritis had dialysis on Sunday and had a AV fistula on the left groin was cannulated.  Following which patient started to have increasing pain to the point now that patient has severe pain on even minimal movement of the left lower extremity.  Also has a ecchymotic area and indurated area in the left groin area.  Patient is Coumadin has been held due to elevated INR and has not taken Coumadin over the weekend.  Due to worsening pain patient was referred to the ER.  ED Course: In the ER on exam patient has severe tenderness in the left groin area which is also indurated and ecchymotic.  Hemoglobin appears to be at baseline INR still therapeutic around 2.  There is concern for bleeding around the AV fistula and Dr. Trula Miles on-call vascular surgeon has been consulted.  Patient will be admitted for further observation.  The patient usually gets dialyzed on Monday Wednesday and Friday since this is a holiday weekend patient was dialyzed on Sunday and is supposed to get dialyzed today but had missed her dialysis due to pain.  Review of Systems: As per HPI, rest all negative.   Past Medical History:  Diagnosis Date  . CHF (congestive heart failure) ([place])   . Diabetes mellitus without complication ([place])   . ESRD (end stage renal disease) on dialysis ([place])   . History of pituitary adenoma   . Hypertension   . Protein C deficiency [facility])     Past Surgical History:  Procedure Laterality Date  . AV FISTULA PLACEMENT Left 03/16/2017   Procedure: INSERTION OF ARTERIOVENOUS (AV)  GORE-TEX GRAFT THIGH-LEFT;  Surgeon: Angelia Mould, MD;  Location: [facility];  Service: Vascular;  Laterality: Left;  . CHOLECYSTECTOMY    . DIALYSIS FISTULA CREATION     clotted off  . INSERTION OF DIALYSIS CATHETER Right 03/16/2017   Procedure: INSERTION OF RIGHT FEMORAL TUNNELED DIALYSIS CATHETER;  Surgeon: Angelia Mould, MD;  Location: [facility];  Service: Vascular;  Laterality: Right;  . IVC FILTER INSERTION    . PORTACATH PLACEMENT       reports that  has never smoked. she has never used smokeless tobacco. She reports that she does not drink alcohol or use drugs.  No Known Allergies  Family History  Problem Relation Age of Onset  . Breast cancer Mother   . Diabetes Sister   . Diabetes Brother   . CAD Other   . Stroke Neg Hx     Prior to Admission medications   Medication Sig Start Date End Date Taking? Authorizing Provider  acetaminophen (TYLENOL) 325 MG tablet Take 650 mg by mouth every 6 (six) hours as needed for mild pain.   Yes [provider]  Amino Acids-Protein Hydrolys (FEEDING SUPPLEMENT, PRO-STAT SUGAR FREE 64,) LIQD Take 30 mLs by mouth 2 (two) times daily. 04/12/17  Yes Sheikh, Omair Latif, DO  amoxicillin (AMOXIL) 500 MG tablet Take 1,000 mg by mouth 2 (two) times daily. FOR 14 DAYS   Yes [provider]  aspirin 325 MG EC tablet Take 325 mg daily by  mouth.   Yes [provider]  calcitRIOL (ROCALTROL) 0.5 MCG capsule Take 0.5 mcg by mouth daily.   Yes [provider]  camphor-menthol Timoteo Ace) lotion Apply 1 application topically every 8 (eight) hours as needed for itching. 04/12/17  Yes Sheikh, Omair Latif, DO  carvedilol (COREG) 6.25 MG tablet Take 1 tablet (6.25 mg total) by mouth 2 (two) times daily. 03/04/17 03/04/18 Yes Hosie Poisson, MD  clarithromycin (BIAXIN) 500 MG tablet Take 500 mg by mouth 2 (two) times daily. FOR 14 DAYS 05/02/17 05/16/17 Yes [provider]  docusate sodium (COLACE) 100 MG capsule  Take 1 capsule (100 mg total) by mouth 2 (two) times daily. 04/12/17  Yes Sheikh, Omair Latif, DO  famotidine (PEPCID) 20 MG tablet Take 1 tablet (20 mg total) daily by mouth. 04/28/17  Yes Lavina Hamman, MD  HYDROcodone-acetaminophen (NORCO/VICODIN) 5-325 MG tablet Take 1 tablet 3 (three) times daily as needed by mouth for severe pain. Patient taking differently: Take 1 tablet by mouth every 8 (eight) hours as needed (for pain).  04/28/17  Yes Lavina Hamman, MD  hydrOXYzine (ATARAX/VISTARIL) 25 MG tablet Take 1 tablet (25 mg total) by mouth every 8 (eight) hours as needed for itching. 04/12/17  Yes Sheikh, Omair Latif, DO  insulin aspart (NOVOLOG) 100 UNIT/ML injection Inject 0-9 Units into the skin 3 (three) times daily with meals. Patient taking differently: Inject 0-9 Units into the skin 3 (three) times daily with meals. Per sliding scale: BGL 110-149 = 0 units; 150-200 = 2 units; 201-250 = 4 units; 251-300 = 6 units; 301-350 = 8 units; 351-400 = 9 units; 401-450 = CALL MD 03/22/17  Yes Debbe Odea, MD  ipratropium-albuterol (DUONEB) 0.5-2.5 (3) MG/3ML SOLN Take 3 mLs by nebulization every 4 (four) hours as needed. Patient taking differently: Take 3 mLs by nebulization every 4 (four) hours as needed (for shortness of breath).  04/12/17  Yes Sheikh, Omair Latif, DO  linaclotide (LINZESS) 145 MCG CAPS capsule Take 145 mcg by mouth daily before breakfast.   Yes [provider]  methocarbamol (ROBAXIN) 500 MG tablet Take 1 tablet (500 mg total) every 8 (eight) hours as needed by mouth for muscle spasms. 04/28/17  Yes Lavina Hamman, MD  midodrine (PROAMATINE) 10 MG tablet Take 1 tablet (10 mg total) by mouth every Monday, Wednesday, and Friday with hemodialysis. Patient taking differently: Take 10 mg by mouth every Monday, Wednesday, and Friday.  04/14/17  Yes Sheikh, Omair Latif, DO  multivitamin (RENA-VIT) TABS tablet Take 1 tablet by mouth at bedtime. 04/12/17  Yes Sheikh, Omair Latif, DO   Nutritional Supplements (FEEDING SUPPLEMENT, NEPRO CARB STEADY,) LIQD Take 237 mLs by mouth 3 (three) times daily as needed (Supplement). Patient taking differently: Take 237 mLs by mouth 3 (three) times daily.  04/12/17  Yes Sheikh, Omair Latif, DO  pantoprazole (PROTONIX) 40 MG tablet Take 1 tablet (40 mg total) 2 (two) times daily before a meal by mouth. 04/28/17  Yes Lavina Hamman, MD  PARoxetine (PAXIL) 10 MG tablet Take 1 tablet (10 mg total) daily by mouth. 04/28/17  Yes Lavina Hamman, MD  polyethylene glycol [facility] / GLYCOLAX) packet Take 17 g daily by mouth. 04/28/17  Yes Lavina Hamman, MD  sucralfate (CARAFATE) 1 GM/10ML suspension Take 10 mLs (1 g total) 4 (four) times daily -  with meals and at bedtime by mouth. Patient taking differently: Take 1 g by mouth 4 (four) times daily. WITH A MEAL OR SNACK  04/28/17  Yes Lavina Hamman, MD  triamcinolone cream (KENALOG) 0.1 % Apply 1 application topically 2 (two) times daily. APPLY TO HAND(S) AS DIRECTED   Yes [provider]  warfarin (COUMADIN) 4 MG tablet Take 4 mg by mouth daily.   Yes [provider]  simvastatin (ZOCOR) 40 MG tablet Take 1 tablet (40 mg total) by mouth daily. 03/05/17   Hosie Poisson, MD  warfarin (COUMADIN) 5 MG tablet Take 1 tablet (5 mg total) by mouth daily at 6 PM. Patient not taking: Reported on 05/09/2017 04/12/17   Kerney Elbe, DO    Physical Exam: Vitals:   05/09/17 2000 05/09/17 2030 05/09/17 2100 05/09/17 2130  BP: 125/85 (!) 139/52 (!) 129/53 (!) 112/42  Pulse: 63 89 73 68  Resp:      Temp:      TempSrc:      SpO2: 100% 98% 99% 97%      Constitutional: Moderately built and nourished. Vitals:   05/09/17 2000 05/09/17 2030 05/09/17 2100 05/09/17 2130  BP: 125/85 (!) 139/52 (!) 129/53 (!) 112/42  Pulse: 63 89 73 68  Resp:      Temp:      TempSrc:      SpO2: 100% 98% 99% 97%   Eyes: Anicteric no pallor. ENMT: No discharge from the ears eyes nose or mouth. Neck:  No mass felt.  No neck rigidity. Respiratory: No rhonchi or crepitations. Cardiovascular: S1-S2 heard no murmurs appreciated. Abdomen: Soft nontender bowel sounds present. Musculoskeletal: Severe tenderness and indurated area in the left groin area. Skin: Mild ecchymotic area in the left groin area. Neurologic: Alert awake oriented to time place and person.  Moves all extremities. Psychiatric: Appears normal.  Normal affect.   Labs on Admission: I have personally reviewed following labs and imaging studies  CBC: Recent Labs  Lab 05/09/17 1820  WBC 6.4  NEUTROABS 3.6  HGB 9.2*  HCT 28.6*  MCV 77.5*  PLT 563   Basic Metabolic Panel: Recent Labs  Lab 05/09/17 1820  NA 130*  K 4.1  CL 94*  CO2 25  GLUCOSE 344*  BUN 44*  CREATININE 5.53*  CALCIUM 8.3*   GFR: Estimated Creatinine Clearance: 9.5 mL/min (A) (by C-G formula based on SCr of 5.53 mg/dL (H)). Liver Function Tests: Recent Labs  Lab 05/09/17 1820  AST 17  ALT 9*  ALKPHOS 77  BILITOT 0.5  PROT 6.3*  ALBUMIN 3.1*   No results for input(s): LIPASE, AMYLASE in the last 168 hours. No results for input(s): AMMONIA in the last 168 hours. Coagulation Profile: Recent Labs  Lab 05/09/17 1820  INR 2.04   Cardiac Enzymes: No results for input(s): CKTOTAL, CKMB, CKMBINDEX, TROPONINI in the last 168 hours. BNP (last 3 results) No results for input(s): PROBNP in the last 8760 hours. HbA1C: No results for input(s): HGBA1C in the last 72 hours. CBG: Recent Labs  Lab 05/09/17 1619  GLUCAP 384*   Lipid Profile: No results for input(s): CHOL, HDL, LDLCALC, TRIG, CHOLHDL, LDLDIRECT in the last 72 hours. Thyroid Function Tests: No results for input(s): TSH, T4TOTAL, FREET4, T3FREE, THYROIDAB in the last 72 hours. Anemia Panel: No results for input(s): VITAMINB12, FOLATE, FERRITIN, TIBC, IRON, RETICCTPCT in the last 72 hours. Urine analysis:    Component Value Date/Time   COLORURINE YELLOW 04/25/2017 1940    APPEARANCEUR CLEAR 04/25/2017 1940   LABSPEC 1.013 04/25/2017 1940   PHURINE 8.0 04/25/2017 1940   GLUCOSEU NEGATIVE 04/25/2017 1940   HGBUR  NEGATIVE 04/25/2017 1940   BILIRUBINUR NEGATIVE 04/25/2017 1940   KETONESUR 20 (A) 04/25/2017 1940   PROTEINUR 100 (A) 04/25/2017 1940   NITRITE NEGATIVE 04/25/2017 1940   LEUKOCYTESUR TRACE (A) 04/25/2017 1940   Sepsis Labs: @LABRCNTIP (procalcitonin:4,lacticidven:4) )No results found for this or any previous visit (from the past 240 hour(s)).   Radiological Exams on Admission: No results found.   Assessment/Plan Principal Problem:   Left leg swelling Active Problems:   Diabetes mellitus with complication (HCC)   Essential hypertension   Chronic diastolic CHF (congestive heart failure) (HCC)   DVT (deep venous thrombosis) (HCC)   ESRD on dialysis ([place])   Anemia due to chronic kidney disease    1. Left groin pain possibly from hematoma around AV fistula -Coumadin has been held.  Type and screen ordered.  Recheck INR in the morning.  Dr. Trula Miles on-call vascular surgeon has been consulted.  Will keep patient n.p.o. in anticipation of procedure.  We will check serial CBC to make sure there is no drop in hemoglobin. 2. History of DVT and protein C deficiency -Coumadin held due to #1.  Patient and daughter agreeable. 3. Diabetes mellitus type 2 -since last discharge patient has been only on sliding scale coverage. 4. ESRD on hemodialysis Monday Wednesday and Friday -due to holiday schedule patient's dialysis was due today but has not done it because of the pain in the left groin.  Please consult nephrologist in a.m.  At this time patient does not appear to be in volume overload. 5. Chronic diastolic CHF -volume managed per nephrology.  I have reviewed patient's old charts and labs.   DVT prophylaxis: Coumadin on hold.  INR is therapeutic. Code Status: Full code. Family Communication: Patient's daughter. Disposition Plan: Probably skilled  nursing facility. Consults called: Vascular surgeon. Admission status: Observation.   Rise Patience MD [facility] Pager [phone].  If 7PM-7AM, please contact night-coverage [email or web address] Password [email or web address]  05/09/2017, 11:22 PM

## 2017-05-09 NOTE — ED Notes (Signed)
Pt placed on purewick for comfort.

## 2017-05-10 ENCOUNTER — Encounter (HOSPITAL_COMMUNITY)
Admission: EM | Disposition: A | Discharge status: Skilled Nursing Facility | Payer: Self-pay | Source: Home / Self Care | Attending: Family Medicine

## 2017-05-10 ENCOUNTER — Inpatient Hospital Stay (HOSPITAL_COMMUNITY): Payer: 59

## 2017-05-10 ENCOUNTER — Observation Stay (HOSPITAL_BASED_OUTPATIENT_CLINIC_OR_DEPARTMENT_OTHER): Payer: 59

## 2017-05-10 ENCOUNTER — Other Ambulatory Visit: Payer: Self-pay

## 2017-05-10 ENCOUNTER — Encounter (HOSPITAL_COMMUNITY): Payer: Self-pay | Admitting: *Deleted

## 2017-05-10 ENCOUNTER — Inpatient Hospital Stay (HOSPITAL_COMMUNITY): Payer: 59 | Admitting: Anesthesiology

## 2017-05-10 DIAGNOSIS — Z79899 Other long term (current) drug therapy: Secondary | ICD-10-CM | POA: Diagnosis not present

## 2017-05-10 DIAGNOSIS — I5032 Chronic diastolic (congestive) heart failure: Secondary | ICD-10-CM | POA: Diagnosis present

## 2017-05-10 DIAGNOSIS — T82590A Other mechanical complication of surgically created arteriovenous fistula, initial encounter: Secondary | ICD-10-CM | POA: Diagnosis present

## 2017-05-10 DIAGNOSIS — M7989 Other specified soft tissue disorders: Secondary | ICD-10-CM | POA: Diagnosis not present

## 2017-05-10 DIAGNOSIS — Z992 Dependence on renal dialysis: Secondary | ICD-10-CM | POA: Diagnosis not present

## 2017-05-10 DIAGNOSIS — T82868A Thrombosis of vascular prosthetic devices, implants and grafts, initial encounter: Secondary | ICD-10-CM | POA: Diagnosis not present

## 2017-05-10 DIAGNOSIS — M79609 Pain in unspecified limb: Secondary | ICD-10-CM | POA: Diagnosis not present

## 2017-05-10 DIAGNOSIS — D6859 Other primary thrombophilia: Secondary | ICD-10-CM | POA: Diagnosis present

## 2017-05-10 DIAGNOSIS — E1122 Type 2 diabetes mellitus with diabetic chronic kidney disease: Secondary | ICD-10-CM | POA: Diagnosis present

## 2017-05-10 DIAGNOSIS — I132 Hypertensive heart and chronic kidney disease with heart failure and with stage 5 chronic kidney disease, or end stage renal disease: Secondary | ICD-10-CM | POA: Diagnosis present

## 2017-05-10 DIAGNOSIS — E54 Ascorbic acid deficiency: Secondary | ICD-10-CM | POA: Diagnosis present

## 2017-05-10 DIAGNOSIS — D631 Anemia in chronic kidney disease: Secondary | ICD-10-CM | POA: Diagnosis present

## 2017-05-10 DIAGNOSIS — N186 End stage renal disease: Secondary | ICD-10-CM | POA: Diagnosis not present

## 2017-05-10 DIAGNOSIS — Z7982 Long term (current) use of aspirin: Secondary | ICD-10-CM | POA: Diagnosis not present

## 2017-05-10 DIAGNOSIS — T82838A Hemorrhage of vascular prosthetic devices, implants and grafts, initial encounter: Secondary | ICD-10-CM | POA: Diagnosis present

## 2017-05-10 DIAGNOSIS — Z794 Long term (current) use of insulin: Secondary | ICD-10-CM | POA: Diagnosis not present

## 2017-05-10 DIAGNOSIS — Z8249 Family history of ischemic heart disease and other diseases of the circulatory system: Secondary | ICD-10-CM | POA: Diagnosis not present

## 2017-05-10 DIAGNOSIS — S0012XD Contusion of left eyelid and periocular area, subsequent encounter: Secondary | ICD-10-CM | POA: Diagnosis not present

## 2017-05-10 DIAGNOSIS — Z86718 Personal history of other venous thrombosis and embolism: Secondary | ICD-10-CM | POA: Diagnosis not present

## 2017-05-10 DIAGNOSIS — M898X9 Other specified disorders of bone, unspecified site: Secondary | ICD-10-CM | POA: Diagnosis present

## 2017-05-10 DIAGNOSIS — S0011XD Contusion of right eyelid and periocular area, subsequent encounter: Secondary | ICD-10-CM | POA: Diagnosis not present

## 2017-05-10 DIAGNOSIS — W1811XD Fall from or off toilet without subsequent striking against object, subsequent encounter: Secondary | ICD-10-CM | POA: Diagnosis not present

## 2017-05-10 DIAGNOSIS — Z66 Do not resuscitate: Secondary | ICD-10-CM | POA: Diagnosis present

## 2017-05-10 HISTORY — PX: FALSE ANEURYSM REPAIR: SHX5152

## 2017-05-10 LAB — SURGICAL PCR SCREEN
MRSA, PCR: NEGATIVE
Staphylococcus aureus: NEGATIVE

## 2017-05-10 LAB — CBC
HCT: 27 % — ABNORMAL LOW (ref 36.0–46.0)
HCT: 26.3 % — ABNORMAL LOW (ref 36.0–46.0)
HCT: 27.7 % — ABNORMAL LOW (ref 36.0–46.0)
Hemoglobin: 8.8 g/dL — ABNORMAL LOW (ref 12.0–15.0)
Hemoglobin: 8.5 g/dL — ABNORMAL LOW (ref 12.0–15.0)
Hemoglobin: 8.9 g/dL — ABNORMAL LOW (ref 12.0–15.0)
MCH: 25 pg — ABNORMAL LOW (ref 26.0–34.0)
MCH: 25.1 pg — ABNORMAL LOW (ref 26.0–34.0)
MCH: 25.1 pg — ABNORMAL LOW (ref 26.0–34.0)
MCHC: 32.6 g/dL (ref 30.0–36.0)
MCHC: 32.3 g/dL (ref 30.0–36.0)
MCHC: 32.1 g/dL (ref 30.0–36.0)
MCV: 76.7 fL — ABNORMAL LOW (ref 78.0–100.0)
MCV: 77.8 fL — ABNORMAL LOW (ref 78.0–100.0)
MCV: 78 fL (ref 78.0–100.0)
Platelets: 288 10*3/uL (ref 150–400)
Platelets: 273 10*3/uL (ref 150–400)
Platelets: 290 10*3/uL (ref 150–400)
RBC: 3.52 MIL/uL — ABNORMAL LOW (ref 3.87–5.11)
RBC: 3.38 MIL/uL — ABNORMAL LOW (ref 3.87–5.11)
RBC: 3.55 MIL/uL — ABNORMAL LOW (ref 3.87–5.11)
RDW: 15.9 % — ABNORMAL HIGH (ref 11.5–15.5)
RDW: 16.3 % — ABNORMAL HIGH (ref 11.5–15.5)
RDW: 16.2 % — ABNORMAL HIGH (ref 11.5–15.5)
WBC: 5.3 10*3/uL (ref 4.0–10.5)
WBC: 5.1 10*3/uL (ref 4.0–10.5)
WBC: 4.7 10*3/uL (ref 4.0–10.5)

## 2017-05-10 LAB — GLUCOSE, CAPILLARY
Glucose-Capillary: 111 mg/dL — ABNORMAL HIGH (ref 65–99)
Glucose-Capillary: 114 mg/dL — ABNORMAL HIGH (ref 65–99)
Glucose-Capillary: 223 mg/dL — ABNORMAL HIGH (ref 65–99)
Glucose-Capillary: 232 mg/dL — ABNORMAL HIGH (ref 65–99)
Glucose-Capillary: 250 mg/dL — ABNORMAL HIGH (ref 65–99)
Glucose-Capillary: 258 mg/dL — ABNORMAL HIGH (ref 65–99)
Glucose-Capillary: 265 mg/dL — ABNORMAL HIGH (ref 65–99)
Glucose-Capillary: 83 mg/dL (ref 65–99)

## 2017-05-10 LAB — BASIC METABOLIC PANEL
Anion gap: 10 (ref 5–15)
BUN: 47 mg/dL — ABNORMAL HIGH (ref 6–20)
CO2: 27 mmol/L (ref 22–32)
Calcium: 8.2 mg/dL — ABNORMAL LOW (ref 8.9–10.3)
Chloride: 96 mmol/L — ABNORMAL LOW (ref 101–111)
Creatinine, Ser: 5.65 mg/dL — ABNORMAL HIGH (ref 0.44–1.00)
GFR calc Af Amer: 8 mL/min — ABNORMAL LOW (ref >60)
GFR calc non Af Amer: 7 mL/min — ABNORMAL LOW (ref >60)
Glucose, Bld: 251 mg/dL — ABNORMAL HIGH (ref 65–99)
Potassium: 4 mmol/L (ref 3.5–5.1)
Sodium: 133 mmol/L — ABNORMAL LOW (ref 135–145)

## 2017-05-10 LAB — PROTIME-INR
INR: 2.19
INR: 1.97
Prothrombin Time: 24.2 seconds — ABNORMAL HIGH (ref 11.4–15.2)
Prothrombin Time: 22.2 seconds — ABNORMAL HIGH (ref 11.4–15.2)

## 2017-05-10 SURGERY — REPAIR, PSEUDOANEURYSM
Anesthesia: Monitor Anesthesia Care | Laterality: Left

## 2017-05-10 SURGERY — A/V FISTULAGRAM
Anesthesia: LOCAL

## 2017-05-10 MED ORDER — FENTANYL CITRATE (PF) 100 MCG/2ML IJ SOLN
25.0000 ug | INTRAMUSCULAR | Status: DC | PRN
  Administered 2017-05-10: 50 ug via INTRAVENOUS

## 2017-05-10 MED ORDER — PROPOFOL 500 MG/50ML IV EMUL
INTRAVENOUS | Status: DC | PRN
  Administered 2017-05-10: 100 ug/kg/min via INTRAVENOUS

## 2017-05-10 MED ORDER — SODIUM CHLORIDE 0.9 % IV SOLN
INTRAVENOUS | Status: DC | PRN
  Administered 2017-05-10: 21:00:00

## 2017-05-10 MED ORDER — LACTATED RINGERS IV SOLN: INTRAVENOUS | Status: DC

## 2017-05-10 MED ORDER — HYDROCODONE-ACETAMINOPHEN 5-325 MG PO TABS
ORAL_TABLET | ORAL | Status: AC
  Filled 2017-05-10: qty 1

## 2017-05-10 MED ORDER — 0.9 % SODIUM CHLORIDE (POUR BTL) OPTIME
TOPICAL | Status: DC | PRN
  Administered 2017-05-10: 1000 mL

## 2017-05-10 MED ORDER — PHENYLEPHRINE HCL 10 MG/ML IJ SOLN
INTRAMUSCULAR | Status: DC | PRN
  Administered 2017-05-10: 50 ug/min via INTRAVENOUS

## 2017-05-10 MED ORDER — PROMETHAZINE HCL 25 MG/ML IJ SOLN: 6.2500 mg | INTRAMUSCULAR | Status: DC | PRN

## 2017-05-10 MED ORDER — SODIUM CHLORIDE 0.9 % IV SOLN
INTRAVENOUS | Status: DC | PRN
  Administered 2017-05-10: 20:00:00 via INTRAVENOUS

## 2017-05-10 MED ORDER — VITAMIN K1 10 MG/ML IJ SOLN
5.0000 mg | Freq: Once | INTRAMUSCULAR | Status: AC
  Administered 2017-05-10: 5 mg via SUBCUTANEOUS
  Filled 2017-05-10: qty 0.5

## 2017-05-10 MED ORDER — IODIXANOL 320 MG/ML IV SOLN
INTRAVENOUS | Status: DC | PRN
  Administered 2017-05-10: 100 mL

## 2017-05-10 MED ORDER — MEPERIDINE HCL 25 MG/ML IJ SOLN: 6.2500 mg | INTRAMUSCULAR | Status: DC | PRN

## 2017-05-10 MED ORDER — FENTANYL CITRATE (PF) 250 MCG/5ML IJ SOLN
INTRAMUSCULAR | Status: AC
  Filled 2017-05-10: qty 5

## 2017-05-10 MED ORDER — PHENYLEPHRINE HCL 10 MG/ML IJ SOLN
INTRAMUSCULAR | Status: DC | PRN
  Administered 2017-05-10: 100 ug via INTRAVENOUS

## 2017-05-10 MED ORDER — CEFAZOLIN SODIUM-DEXTROSE 2-3 GM-%(50ML) IV SOLR
INTRAVENOUS | Status: DC | PRN
  Administered 2017-05-10: 2 g via INTRAVENOUS

## 2017-05-10 MED ORDER — FENTANYL CITRATE (PF) 100 MCG/2ML IJ SOLN
INTRAMUSCULAR | Status: AC
  Administered 2017-05-10: 50 ug via INTRAVENOUS
  Filled 2017-05-10: qty 2

## 2017-05-10 MED ORDER — LIDOCAINE-EPINEPHRINE (PF) 1 %-1:200000 IJ SOLN
INTRAMUSCULAR | Status: AC
  Filled 2017-05-10: qty 30

## 2017-05-10 MED ORDER — LIDOCAINE-EPINEPHRINE (PF) 1 %-1:200000 IJ SOLN
INTRAMUSCULAR | Status: DC | PRN
  Administered 2017-05-10: 30 mL

## 2017-05-10 MED ORDER — FENTANYL CITRATE (PF) 100 MCG/2ML IJ SOLN
INTRAMUSCULAR | Status: DC | PRN
  Administered 2017-05-10: 50 ug via INTRAVENOUS

## 2017-05-10 MED ORDER — FENTANYL CITRATE (PF) 100 MCG/2ML IJ SOLN
50.0000 ug | INTRAMUSCULAR | Status: DC | PRN
  Administered 2017-05-10: 50 ug via INTRAVENOUS

## 2017-05-10 MED ORDER — SODIUM CHLORIDE 0.9 % IV SOLN
INTRAVENOUS | Status: DC
  Administered 2017-05-10: 23:00:00 via INTRAVENOUS

## 2017-05-10 MED ORDER — CALCIUM CARBONATE ANTACID 500 MG PO CHEW: 400.0000 mg | CHEWABLE_TABLET | Freq: Three times a day (TID) | ORAL | Status: DC | PRN

## 2017-05-10 MED ORDER — OXYCODONE HCL 5 MG PO TABS
5.0000 mg | ORAL_TABLET | Freq: Once | ORAL | Status: AC
  Administered 2017-05-10: 5 mg via ORAL
  Filled 2017-05-10: qty 1

## 2017-05-10 SURGICAL SUPPLY — 47 items
BALLN MUSTANG 7.0X40 75 (BALLOONS) ×2
BALLOON MUSTANG 7.0X40 75 (BALLOONS) ×1 IMPLANT
CANISTER SUCT 3000ML PPV (MISCELLANEOUS) ×2 IMPLANT
CLIP VESOCCLUDE MED 24/CT (CLIP) IMPLANT
CLIP VESOCCLUDE SM WIDE 24/CT (CLIP) IMPLANT
DERMABOND ADVANCED (GAUZE/BANDAGES/DRESSINGS) ×1
DERMABOND ADVANCED .7 DNX12 (GAUZE/BANDAGES/DRESSINGS) ×1 IMPLANT
DRAIN CHANNEL 15F RND FF W/TCR (WOUND CARE) IMPLANT
DRAPE C-ARM 42X72 X-RAY (DRAPES) ×2 IMPLANT
ELECT REM PT RETURN 9FT ADLT (ELECTROSURGICAL) ×2
ELECTRODE REM PT RTRN 9FT ADLT (ELECTROSURGICAL) ×1 IMPLANT
EVACUATOR SILICONE 100CC (DRAIN) IMPLANT
GAUZE SPONGE 4X4 12PLY STRL LF (GAUZE/BANDAGES/DRESSINGS) ×2 IMPLANT
GLOVE BIOGEL M 6.5 STRL (GLOVE) ×2 IMPLANT
GLOVE BIOGEL PI IND STRL 7.5 (GLOVE) ×1 IMPLANT
GLOVE BIOGEL PI INDICATOR 7.5 (GLOVE) ×1
GLOVE SURG SS PI 7.5 STRL IVOR (GLOVE) ×2 IMPLANT
GOWN STRL REUS W/ TWL LRG LVL3 (GOWN DISPOSABLE) ×1 IMPLANT
GOWN STRL REUS W/ TWL XL LVL3 (GOWN DISPOSABLE) ×1 IMPLANT
GOWN STRL REUS W/TWL LRG LVL3 (GOWN DISPOSABLE) ×1
GOWN STRL REUS W/TWL XL LVL3 (GOWN DISPOSABLE) ×1
HEMOSTAT SNOW SURGICEL 2X4 (HEMOSTASIS) IMPLANT
KIT BASIN OR (CUSTOM PROCEDURE TRAY) ×2 IMPLANT
KIT ROOM TURNOVER OR (KITS) ×2 IMPLANT
NEEDLE HYPO 25GX1X1/2 BEV (NEEDLE) ×2 IMPLANT
NS IRRIG 1000ML POUR BTL (IV SOLUTION) ×4 IMPLANT
PACK PERIPHERAL VASCULAR (CUSTOM PROCEDURE TRAY) ×2 IMPLANT
PAD ARMBOARD 7.5X6 YLW CONV (MISCELLANEOUS) ×4 IMPLANT
SET MICROPUNCTURE 5F STIFF (MISCELLANEOUS) ×2 IMPLANT
SHEATH PINNACLE R/O II 7F 4CM (SHEATH) ×2 IMPLANT
STENT VIABAHN 7X50X120 (Permanent Stent) ×1 IMPLANT
STENT VIABAHN 7X5X120 7FR (Permanent Stent) ×1 IMPLANT
SUT ETHILON 3 0 PS 1 (SUTURE) ×2 IMPLANT
SUT PROLENE 5 0 C 1 24 (SUTURE) ×4 IMPLANT
SUT PROLENE 6 0 BV (SUTURE) ×2 IMPLANT
SUT VIC AB 2-0 CT1 27 (SUTURE) ×2
SUT VIC AB 2-0 CT1 TAPERPNT 27 (SUTURE) ×2 IMPLANT
SUT VIC AB 3-0 SH 27 (SUTURE) ×2
SUT VIC AB 3-0 SH 27X BRD (SUTURE) ×2 IMPLANT
SUT VICRYL 4-0 PS2 18IN ABS (SUTURE) IMPLANT
SYR 20CC LL (SYRINGE) ×2 IMPLANT
SYR CONTROL 10ML LL (SYRINGE) ×2 IMPLANT
TAPE CLOTH SURG 4X10 WHT LF (GAUZE/BANDAGES/DRESSINGS) ×2 IMPLANT
TRAY FOLEY W/METER SILVER 16FR (SET/KITS/TRAYS/PACK) ×2 IMPLANT
WATER STERILE IRR 1000ML POUR (IV SOLUTION) ×2 IMPLANT
WIRE BENTSON .035X145CM (WIRE) ×2 IMPLANT
WIRE SPARTACORE .014X190CM (WIRE) ×2 IMPLANT

## 2017-05-10 NOTE — Anesthesia Preprocedure Evaluation (Addendum)
Anesthesia Evaluation  Patient identified by MRN, date of birth, ID band Patient awake    Reviewed: Allergy & Precautions, NPO status , Patient's Chart, lab work & pertinent test results  Airway Mallampati: III  TM Distance: >3 FB Neck ROM: Full    Dental  (+) Teeth Intact, Dental Advisory Given, Missing,    Pulmonary neg pulmonary ROS,    breath sounds clear to auscultation       Cardiovascular hypertension, Pt. on home beta blockers and Pt. on medications +CHF  negative cardio ROS   Rhythm:Regular Rate:Normal     Neuro/Psych PSYCHIATRIC DISORDERS TIAnegative neurological ROS     GI/Hepatic Neg liver ROS, GERD  Medicated,  Endo/Other  negative endocrine ROSdiabetes, Insulin Dependent  Renal/GU ESRFRenal diseasenegative Renal ROS     Musculoskeletal negative musculoskeletal ROS (+)   Abdominal   Peds  Hematology negative hematology ROS (+)   Anesthesia Other Findings Day of surgery medications reviewed with the patient.  Reproductive/Obstetrics                            Lab Results  Component Value Date   WBC 4.7 05/10/2017   HGB 8.9 (L) 05/10/2017   HCT 27.7 (L) 05/10/2017   MCV 78.0 05/10/2017   PLT 290 05/10/2017   Lab Results  Component Value Date   CREATININE 5.65 (H) 05/10/2017   BUN 47 (H) 05/10/2017   NA 133 (L) 05/10/2017   K 4.0 05/10/2017   CL 96 (L) 05/10/2017   CO2 27 05/10/2017   EKG: normal sinus rhythm.  Anesthesia Physical Anesthesia Plan  ASA: III  Anesthesia Plan: MAC   Post-op Pain Management:    Induction: Intravenous  PONV Risk Score and Plan: 3 and Ondansetron, Propofol infusion and Midazolam  Airway Management Planned: Natural Airway and Nasal Cannula  Additional Equipment:   Intra-op Plan:   Post-operative Plan:   Informed Consent: I have reviewed the patients History and Physical, chart, labs and discussed the procedure including  the risks, benefits and alternatives for the proposed anesthesia with the patient or authorized representative who has indicated his/her understanding and acceptance.     Plan Discussed with:   Anesthesia Plan Comments:         Anesthesia Quick Evaluation

## 2017-05-10 NOTE — Progress Notes (Signed)
New Admission Note:  Arrival Method: Via stretcher from ED.  Mental Orientation: Alert & Oriented x4 Telemetry: CCMD verified. Assessment: Completed Skin: Refer to flowsheet. IV: Right Chest Port. Pain: 8/10 Tubes: Safety Measures: Safety Fall Prevention Plan discussed with patient. Admission: Completed 2 [place] Orientation: Patient has been orientated to the room, unit and the staff.  Orders have been reviewed and implemented. Will continue to monitor the patient. Call light has been placed within reach and bed alarm has been activated.   Vassie Moselle, RN  Phone Number: [phone]

## 2017-05-10 NOTE — Progress Notes (Signed)
Patient is questioning whether or not she will have surgery today, if not, could she have a meal tray. Trula Slade, MD notified. Trula Slade, MD stated that patient will have surgery tonight and to keep patient NPO. Patient updated of this information. Will continue to monitor.

## 2017-05-10 NOTE — Transfer of Care (Signed)
Immediate Anesthesia Transfer of Care Note  Patient: Ashlee Miles  Procedure(s) Performed: REPAIR FALSE ANEURYSM left thigh AVGG using Gore 6mx5cm Viabahn Endoprosthesis (Left )  Patient Location: PACU  Anesthesia Type:MAC  Level of Consciousness: awake, alert  and oriented  Airway & Oxygen Therapy: Patient Spontanous Breathing and Patient connected to nasal cannula oxygen  Post-op Assessment: Report given to RN and Post -op Vital signs reviewed and stable  Post vital signs: Reviewed and stable  Last Vitals:  Vitals:   05/10/17 0900 05/10/17 1700  BP: (!) 135/40 (!) 136/56  Pulse: 68 62  Resp: 19 18  Temp: 37 C   SpO2: 100% 100%    Last Pain:  Vitals:   05/10/17 1756  TempSrc:   PainSc: 9       Patients Stated Pain Goal: 0 [id number]  Complications: No apparent anesthesia complications

## 2017-05-10 NOTE — Anesthesia Postprocedure Evaluation (Signed)
Anesthesia Post Note  Patient: Ashlee Miles  Procedure(s) Performed: REPAIR FALSE ANEURYSM left thigh AVGG using Gore 73mx5cm Viabahn Endoprosthesis (Left )     Patient location during evaluation: PACU Anesthesia Type: MAC Level of consciousness: awake and alert Pain management: pain level controlled Vital Signs Assessment: post-procedure vital signs reviewed and stable Respiratory status: spontaneous breathing, nonlabored ventilation, respiratory function stable and patient connected to nasal cannula oxygen Cardiovascular status: stable and blood pressure returned to baseline Postop Assessment: no apparent nausea or vomiting Anesthetic complications: no    Last Vitals:  Vitals:   05/10/17 2146 05/10/17 2147  BP: (!) 123/54   Pulse: 64 62  Resp: (!) 9 13  Temp: (!) 36.3 C   SpO2: 99% 99%           KEffie Berkshire

## 2017-05-10 NOTE — Consult Note (Signed)
[facility]  Patient name: Ashlee Miles MRN: [id number] DOB: 1940/11/09 Sex: female   REQUESTING PROVIDER:    ER   REASON FOR CONSULT:    Hematoma, left thigh dialysis graft  HISTORY OF PRESENT ILLNESS:   Ashlee Miles is a 76 y.o. female, who came to the ER last night with pain in her left thigh.  This is been present since cannulation with dialysis over the weekend.  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  The area is very tender to the touch.  PAST MEDICAL HISTORY    Past Medical History:  Diagnosis Date  . CHF (congestive heart failure) ([place])   . Diabetes mellitus without complication ([place])   . ESRD (end stage renal disease) on dialysis ([place])   . History of pituitary adenoma   . Hypertension   . Protein C deficiency ([place])      FAMILY HISTORY   Family History  Problem Relation Age of Onset  . Breast cancer Mother   . Diabetes Sister   . Diabetes Brother   . CAD Other   . Stroke Neg Hx     SOCIAL HISTORY:   Social History   Socioeconomic History  . Marital status: Married    Spouse name: Not on file  . Number of children: Not on file  . Years of education: Not on file  . Highest education level: Not on file  Social Needs  . Financial resource strain: Not on file  . Food insecurity - worry: Not on file  . Food insecurity - inability: Not on file  . Transportation needs - medical: Not on file  . Transportation needs - non-medical: Not on file  Occupational History  . Occupation: retired  Tobacco Use  . Smoking status: Never Smoker  . Smokeless tobacco: Never Used  Substance and Sexual Activity  . Alcohol use: No  . Drug use: No  . Sexual activity: Not on file  Other Topics Concern  . Not on file  Social History Narrative  . Not on file    ALLERGIES:    No Known Allergies  CURRENT MEDICATIONS:    Current Facility-Administered Medications  Medication Dose  Route Frequency Provider Last Rate Last Dose  . acetaminophen (TYLENOL) tablet 650 mg  650 mg Oral Q6H PRN Rise Patience, MD       Or  . acetaminophen (TYLENOL) suppository 650 mg  650 mg Rectal Q6H PRN Rise Patience, MD      . calcitRIOL (ROCALTROL) capsule 0.5 mcg  0.5 mcg Oral Daily Rise Patience, MD      . carvedilol (COREG) tablet 6.25 mg  6.25 mg Oral BID WC Rise Patience, MD      . docusate sodium (COLACE) capsule 100 mg  100 mg Oral BID Rise Patience, MD   100 mg at 05/10/17 0038  . famotidine (PEPCID) tablet 20 mg  20 mg Oral Daily Rise Patience, MD      . feeding supplement (NEPRO CARB STEADY) liquid 237 mL  237 mL Oral TID Rise Patience, MD      . feeding supplement (PRO-STAT SUGAR FREE 64) liquid 30 mL  30 mL Oral BID Rise Patience, MD      . HYDROcodone-acetaminophen (NORCO/VICODIN) 5-325 MG per tablet 1 tablet  1 tablet Oral Q8H PRN Rise Patience, MD   1 tablet at 05/10/17 0038  . hydrOXYzine (ATARAX/VISTARIL) tablet 25 mg  25 mg Oral Q8H PRN Rise Patience, MD      . insulin aspart (novoLOG) injection 0-9 Units  0-9 Units Subcutaneous TID WC Rise Patience, MD      . ipratropium-albuterol (DUONEB) 0.5-2.5 (3) MG/3ML nebulizer solution 3 mL  3 mL Nebulization Q4H PRN Rise Patience, MD      . linaclotide [facility]) capsule 145 mcg  145 mcg Oral QAC breakfast Rise Patience, MD      . methocarbamol (ROBAXIN) tablet 500 mg  500 mg Oral Q8H PRN Rise Patience, MD      . midodrine (PROAMATINE) tablet 10 mg  10 mg Oral Q M,W,F Rise Patience, MD      . multivitamin (RENA-VIT) tablet 1 tablet  1 tablet Oral QHS Rise Patience, MD   1 tablet at 05/10/17 0038  . ondansetron (ZOFRAN) tablet 4 mg  4 mg Oral Q6H PRN Rise Patience, MD       Or  . ondansetron [facility]) injection 4 mg  4 mg Intravenous Q6H PRN Rise Patience, MD      . pantoprazole (PROTONIX) EC tablet 40 mg  40 mg  Oral BID AC Rise Patience, MD      . PARoxetine (PAXIL) tablet 10 mg  10 mg Oral Daily Rise Patience, MD      . polyethylene glycol (MIRALAX / GLYCOLAX) packet 17 g  17 g Oral Daily Rise Patience, MD      . simvastatin (ZOCOR) tablet 40 mg  40 mg Oral Daily Rise Patience, MD      . sodium chloride flush (NS) 0.9 % injection 10-40 mL  10-40 mL Intracatheter PRN Cardama, Grayce Sessions, MD      . sucralfate (CARAFATE) 1 GM/10ML suspension 1 g  1 g Oral TID AC & HS Rise Patience, MD        REVIEW OF SYSTEMS:   [X]  denotes positive finding, [ ]  denotes negative finding Cardiac  Comments:  Chest pain or chest pressure:    Shortness of breath upon exertion:    Short of breath when lying flat:    Irregular heart rhythm:        Vascular    Pain in calf, thigh, or hip brought on by ambulation:    Pain in feet at night that wakes you up from your sleep:     Blood clot in your veins:    Leg swelling:         Pulmonary    Oxygen at home:    Productive cough:     Wheezing:         Neurologic    Sudden weakness in arms or legs:     Sudden numbness in arms or legs:     Sudden onset of difficulty speaking or slurred speech:    Temporary loss of vision in one eye:     Problems with dizziness:         Gastrointestinal    Blood in stool:      Vomited blood:         Genitourinary    Burning when urinating:     Blood in urine:        Psychiatric    Major depression:         Hematologic    Bleeding problems:    Problems with blood clotting too easily:        Skin    Rashes or  ulcers:        Constitutional    Fever or chills:     PHYSICAL EXAM:   Vitals:   05/09/17 2315 05/09/17 2330 05/10/17 0024 05/10/17 0644  BP: (!) 136/47 (!) 124/59 (!) 151/62 (!) 136/57  Pulse: 73 71 76 77  Resp:   20 18  Temp:   98.6 F (37 C) 98.8 F (37.1 C)  TempSrc:   Oral Oral  SpO2: 97% 99% 95% 98%  Weight:   217 lb 1.6 oz (98.5 kg)   Height:   5\' 1"  (1.549  m)     GENERAL: The patient is a well-nourished female, in no acute distress. The vital signs are documented above. CARDIAC: There is a regular rate and rhythm.  VASCULAR: Palpable thrill within dialysis graft.  Large hematoma on the medial aspect of the graft PULMONARY: Nonlabored respirations  MUSCULOSKELETAL: There are no major deformities or cyanosis. NEUROLOGIC: No focal weakness or paresthesias are detected. SKIN: There are no ulcers or rashes noted. PSYCHIATRIC: The patient has a normal affect.  STUDIES:   None  ASSESSMENT and PLAN   The patient either has a hematoma or a pseudoaneurysm in her right thigh.  Hematoma would be managed nonoperatively.  She has a pseudoaneurysm I would consider treating this with covered stent placement once her coagulation profile has corrected.  In order to make this distinction, she will need an ultrasound.  I will have this done first thing this morning so that if she needs to have this repaired it can be done today.  She will need to be n.p.o. until his surgical decision has been made   Annamarie Major, MD [facility] [phone] Pager [phone]

## 2017-05-10 NOTE — Op Note (Addendum)
    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  The venous outflow tract was also treated with angioplasty  Indications: The patient developed a painful hematoma on the medial side of her dialysis graft following dialysis this weekend.  Ultrasound this morning confirmed the presence of a pseudoaneurysm.  She is here today for endovascular repair.  I discussed that endovascular repair would be preferable to surgical repair given the hematoma and risk for infection with surgical repair.  Procedure:  The patient was identified in the holding area and taken to [facility]  The patient was then placed supine on the table. MAC anesthesia was administered.  The patient was prepped and draped in the usual sterile fashion.  A time out was called and antibiotics were administered.  Ultrasound was used to evaluate the left thigh dialysis graft.  Ultrasound also visualized the pseudoaneurysm.  I cannulated the dialysis graft under ultrasound guidance with a micropuncture needle.  An 018 wire was advanced without resistance.  A micropuncture sheath was placed.  Next, a Bentson wire was inserted followed by a 7 [place] sheath.  No heparin was given.  Contrast injections were performed through the sheath which revealed the area of the pseudoaneurysm.  There is also narrowing of the venous outflow tract.  I then removed the Bentson wire and placed an 014 [place] core wire.  A 7 x 50 Viabahn covered  stent.  This was then molded to confirmation using a 7 x 40 [place] balloon.  I also used this balloon to perform angioplasty of the venous outflow tract.  Follow-up imaging revealed near complete resolution of the pseudoaneurysm.  At this point the wires were removed and a suture was used to close the cannulation site   Disposition: To PACU stable   V. Annamarie Major, M.D. [facility] Office: [phone] Pager:  [phone]

## 2017-05-10 NOTE — Progress Notes (Deleted)
Patient states that she sees black dots when she opens her eyes, but they resolve when she closes them. Neuro exam normal. MD notified. MD gave verbal order to give 400 mg tablets q8h PRN. Order placed. Will continue to monitor.

## 2017-05-10 NOTE — Care Management Obs Status (Signed)
[facility] NOTIFICATION   Patient Details  Name: Ashlee Miles MRN: [id number] Date of Birth: [birth year]   Medicare Observation Status Notification Given:  Yes    Dawayne Patricia, RN 05/10/2017, 3:36 PM

## 2017-05-10 NOTE — Progress Notes (Signed)
Inpatient Diabetes Program Recommendations  AACE/ADA: New Consensus Statement on Inpatient Glycemic Control (2015)  Target Ranges:  Prepandial:   less than 140 mg/dL      Peak postprandial:   less than 180 mg/dL (1-2 hours)      Critically ill patients:  140 - 180 mg/dL   Results for Ashlee Miles, Ashlee Miles (MRN [id number]) as of 05/10/2017 07:55  Ref. Range 05/09/2017 16:19 05/10/2017 00:17  Glucose-Capillary Latest Ref Range: 65 - 99 mg/dL 384 (H) 258 (H)   Admit with: Groin Pain near AVF  History: DM, ESRD, CHF  SNF DM Meds: Novolog 0-9 units TID per SSI  Current Insulin Orders: Novolog Sensitive Correction Scale/ SSI (0-9 units) TID AC        MD- Note patient was recently discharged from hospital to SNF on 04/28/17.    Prior to this last hospitalization, patient was taking 70/30 Insulin 0-6 units BID + Lantus 6 units daily.  CBGs quite elevated upon admission.  If patient continues to have elevated CBGs, may consider starting low dose basal insulin:  Levemir 6 units daily (prior home dose of basal insulin)      --Will follow patient during hospitalization--  Wyn Quaker RN, MSN, CDE Diabetes Coordinator Inpatient Glycemic Control Team Team Pager: [phone] (8a-5p)

## 2017-05-10 NOTE — Progress Notes (Signed)
Patient states that shes black dots when she opens her eyes, but they resolve when she closes them. Neuro exam normal. MD notified. No further orders. Will continue to monitor.

## 2017-05-10 NOTE — Progress Notes (Addendum)
Nutrition Brief Note  Patient identified on the Malnutrition Screening Tool (MST) Report. Weight loss has been insignificant. Patient reports that she lost a few pounds due to poor appetite a few months ago. Since then, appetite and intake has improved and weight has stabilized.   Nutrition focused physical exam completed.  No muscle or subcutaneous fat depletion noticed.   Wt Readings from Last 15 Encounters:  05/10/17 217 lb 1.6 oz (98.5 kg)  04/28/17 218 lb 11.1 oz (99.2 kg)  04/21/17 225 lb (102.1 kg)  04/12/17 225 lb 15.5 oz (102.5 kg)  03/22/17 228 lb 13.4 oz (103.8 kg)  03/02/17 228 lb 12.8 oz (103.8 kg)    Body mass index is 41.02 kg/m. Patient meets criteria for class 3, extreme/morbid obesity based on current BMI.   Current diet order is NPO for possible procedure today. Labs and medications reviewed.   No nutrition interventions warranted at this time. If nutrition issues arise, please consult RD.   Molli Barrows, RD, LDN, [place] Pager [phone] After Hours Pager [phone]

## 2017-05-10 NOTE — Progress Notes (Signed)
Positive for a left thigh pseudoaneurysm post dialysis access.  [facility], [place] 05/10/2017 11:22 PM

## 2017-05-10 NOTE — Progress Notes (Signed)
PROGRESS NOTE [facility]   Jax Abdelrahman   ION:[id number] DOB: 09-22-1940  DOA: 05/09/2017 PCP: Darlina Rumpf, MD   Brief Narrative:  Ashlee Miles  is a 76 y.o. female with history of ESRD on hemodialysis, diabetes mellitus type 2, protein C deficiency and DVT on Coumadin, chronic anemia, history of CHF was recently admitted for abdominal pain likely from gastritis had dialysis on Sunday and had a AV fistula on the left groin was cannulated.  Following which patient started to have increasing pain to the point now that patient has severe pain on even minimal movement of the left lower extremity.  Also has a ecchymotic area and indurated area in the left groin area. Patient is Coumadin has been held due to elevated INR and has not taken Coumadin over the weekend. Due to worsening pain patient was referred to the ER.  Subjective: Patient seen and examined, pain is better controlled, no other concerns.  Assessment & Plan: Left groin pain due to pseudoaneurysm from AV fistula Vascular surgery following to place a stent tonight in the OR. Vitamin K given as INR was therapeutic. Check INR at 18:00   History of DVT and protein C deficiency On Coumadin, which is on hold due to planned surgery  Diabetes mellitus type 2 CBG's stable Continue to monitor while patient is n.p.o.  End-stage renal disease on HD MWF  Continue HD per nephrology  Did not had HD on Wednesday due to pain   DVT prophylaxis: SCD's  Code Status: DNR   Family Communication: None at bedside Disposition Plan:   Consultants:   Nephrology   Vascular Surgery   Procedures:   None   Antimicrobials:  None     Objective: Vitals:   05/09/17 2330 05/10/17 0024 05/10/17 0644 05/10/17 0900  BP: (!) 124/59 (!) 151/62 (!) 136/57 (!) 135/40  Pulse: 71 76 77 68  Resp:  20 18 19   Temp:  98.6 F (37 C) 98.8 F (37.1 C) 98.6 F (37 C)  TempSrc:  Oral Oral Oral  SpO2: 99% 95% 98% 100%  Weight:  98.5 kg  (217 lb 1.6 oz)    Height:  5\' 1"  (1.549 m)      Intake/Output Summary (Last 24 hours) at 05/10/2017 1659 Last data filed at 05/10/2017 1045 Gross per 24 hour  Intake 10 ml  Output 0 ml  Net 10 ml   Filed Weights   05/10/17 0024  Weight: 98.5 kg (217 lb 1.6 oz)    Examination:  General exam: Appears calm and comfortable  HEENT: OP moist and clear Respiratory system: Clear to auscultation. No wheezes,crackle or rhonchi Cardiovascular system: S1 & S2 heard, RRR. No JVD, murmurs, rubs or gallops Gastrointestinal system: Abdomen is nondistended, soft and nontender. Normal bowel sounds heard. Central nervous system: Alert and oriented. No focal neurological deficits. Extremities: Large hematoma on graft, palpable thrill. No LE edema  Skin: No rashes Psychiatry: Mood & affect appropriate.    Data Reviewed: I have personally reviewed following labs and imaging studies  CBC: Recent Labs  Lab 05/09/17 1820 05/10/17 0133 05/10/17 0554 05/10/17 1024  WBC 6.4 5.3 5.1 4.7  NEUTROABS 3.6  --   --   --   HGB 9.2* 8.8* 8.5* 8.9*  HCT 28.6* 27.0* 26.3* 27.7*  MCV 77.5* 76.7* 77.8* 78.0  PLT 311 288 273 244   Basic Metabolic Panel: Recent Labs  Lab 05/09/17 1820 05/10/17 0554  NA 130* 133*  K 4.1 4.0  CL 94* 96*  CO2 25 27  GLUCOSE 344* 251*  BUN 44* 47*  CREATININE 5.53* 5.65*  CALCIUM 8.3* 8.2*   GFR: Estimated Creatinine Clearance: 9.1 mL/min (A) (by C-G formula based on SCr of 5.65 mg/dL (H)). Liver Function Tests: Recent Labs  Lab 05/09/17 1820  AST 17  ALT 9*  ALKPHOS 77  BILITOT 0.5  PROT 6.3*  ALBUMIN 3.1*   No results for input(s): LIPASE, AMYLASE in the last 168 hours. No results for input(s): AMMONIA in the last 168 hours. Coagulation Profile: Recent Labs  Lab 05/09/17 1820 05/10/17 0554  INR 2.04 2.19   Cardiac Enzymes: No results for input(s): CKTOTAL, CKMB, CKMBINDEX, TROPONINI in the last 168 hours. BNP (last 3 results) No results for  input(s): PROBNP in the last 8760 hours. HbA1C: No results for input(s): HGBA1C in the last 72 hours. CBG: Recent Labs  Lab 05/10/17 0017 05/10/17 0809 05/10/17 1024 05/10/17 1120 05/10/17 1328  GLUCAP 258* 232* 250* 265* 223*   Lipid Profile: No results for input(s): CHOL, HDL, LDLCALC, TRIG, CHOLHDL, LDLDIRECT in the last 72 hours. Thyroid Function Tests: No results for input(s): TSH, T4TOTAL, FREET4, T3FREE, THYROIDAB in the last 72 hours. Anemia Panel: No results for input(s): VITAMINB12, FOLATE, FERRITIN, TIBC, IRON, RETICCTPCT in the last 72 hours. Sepsis Labs: No results for input(s): PROCALCITON, LATICACIDVEN in the last 168 hours.  No results found for this or any previous visit (from the past 240 hour(s)).    Radiology Studies: No results found.    Scheduled Meds: . calcitRIOL  0.5 mcg Oral Daily  . carvedilol  6.25 mg Oral BID WC  . docusate sodium  100 mg Oral BID  . famotidine  20 mg Oral Daily  . feeding supplement (NEPRO CARB STEADY)  237 mL Oral TID  . feeding supplement (PRO-STAT SUGAR FREE 64)  30 mL Oral BID  . insulin aspart  0-9 Units Subcutaneous TID WC  . linaclotide  145 mcg Oral QAC breakfast  . midodrine  10 mg Oral Q M,W,F  . multivitamin  1 tablet Oral QHS  . pantoprazole  40 mg Oral BID AC  . PARoxetine  10 mg Oral Daily  . polyethylene glycol  17 g Oral Daily  . simvastatin  40 mg Oral Daily  . sucralfate  1 g Oral TID AC & HS   Continuous Infusions:   LOS: 0 days    Time spent: Total of 25 minutes spent with pt, greater than 50% of which was spent in discussion of  treatment, counseling and coordination of care   Chipper Oman, MD Pager: Text Page via [email or web address]   If 7PM-7AM, please contact night-coverage [email or web address] 05/10/2017, 4:59 PM

## 2017-05-11 LAB — BASIC METABOLIC PANEL
Anion gap: 10 (ref 5–15)
BUN: 50 mg/dL — ABNORMAL HIGH (ref 6–20)
CO2: 25 mmol/L (ref 22–32)
Calcium: 8 mg/dL — ABNORMAL LOW (ref 8.9–10.3)
Chloride: 100 mmol/L — ABNORMAL LOW (ref 101–111)
Creatinine, Ser: 5.47 mg/dL — ABNORMAL HIGH (ref 0.44–1.00)
GFR calc Af Amer: 8 mL/min — ABNORMAL LOW (ref >60)
GFR calc non Af Amer: 7 mL/min — ABNORMAL LOW (ref >60)
Glucose, Bld: 243 mg/dL — ABNORMAL HIGH (ref 65–99)
Potassium: 4.4 mmol/L (ref 3.5–5.1)
Sodium: 135 mmol/L (ref 135–145)

## 2017-05-11 LAB — CBC WITH DIFFERENTIAL/PLATELET
Band Neutrophils: 0 %
Basophils Absolute: 0.1 10*3/uL (ref 0.0–0.1)
Basophils Relative: 1 %
Blasts: 0 %
Eosinophils Absolute: 0.5 10*3/uL (ref 0.0–0.7)
Eosinophils Relative: 9 %
HCT: 27.4 % — ABNORMAL LOW (ref 36.0–46.0)
Hemoglobin: 8.8 g/dL — ABNORMAL LOW (ref 12.0–15.0)
Lymphocytes Relative: 31 %
Lymphs Abs: 1.7 10*3/uL (ref 0.7–4.0)
MCH: 24.9 pg — ABNORMAL LOW (ref 26.0–34.0)
MCHC: 32.1 g/dL (ref 30.0–36.0)
MCV: 77.6 fL — ABNORMAL LOW (ref 78.0–100.0)
Metamyelocytes Relative: 0 %
Monocytes Absolute: 0.4 10*3/uL (ref 0.1–1.0)
Monocytes Relative: 7 %
Myelocytes: 0 %
Neutro Abs: 2.7 10*3/uL (ref 1.7–7.7)
Neutrophils Relative %: 52 %
Other: 0 %
Platelets: 292 10*3/uL (ref 150–400)
Promyelocytes Absolute: 0 %
RBC: 3.53 MIL/uL — ABNORMAL LOW (ref 3.87–5.11)
RDW: 16 % — ABNORMAL HIGH (ref 11.5–15.5)
WBC: 5.4 10*3/uL (ref 4.0–10.5)
nRBC: 0 /100 WBC

## 2017-05-11 LAB — GLUCOSE, CAPILLARY
Glucose-Capillary: 155 mg/dL — ABNORMAL HIGH (ref 65–99)
Glucose-Capillary: 242 mg/dL — ABNORMAL HIGH (ref 65–99)
Glucose-Capillary: 298 mg/dL — ABNORMAL HIGH (ref 65–99)
Glucose-Capillary: 187 mg/dL — ABNORMAL HIGH (ref 65–99)
Glucose-Capillary: 164 mg/dL — ABNORMAL HIGH (ref 65–99)

## 2017-05-11 MED ORDER — DARBEPOETIN ALFA 200 MCG/0.4ML IJ SOSY
200.0000 ug | PREFILLED_SYRINGE | INTRAMUSCULAR | Status: DC
  Administered 2017-05-12: 200 ug via INTRAVENOUS

## 2017-05-11 MED ORDER — WARFARIN SODIUM 5 MG PO TABS
5.0000 mg | ORAL_TABLET | Freq: Once | ORAL | Status: AC
  Administered 2017-05-11: 5 mg via ORAL
  Filled 2017-05-11: qty 1

## 2017-05-11 MED ORDER — WARFARIN - PHARMACIST DOSING INPATIENT: Freq: Every day | Status: DC

## 2017-05-11 NOTE — Progress Notes (Signed)
PROGRESS NOTE [facility]   Ashlee Miles   DQQ:[id number] DOB: Jan 22, 1941  DOA: 05/09/2017 PCP: Ashlee Rumpf, MD   Brief Narrative:  Ashlee Miles  is a 76 y.o. female with history of ESRD on hemodialysis, diabetes mellitus type 2, protein C deficiency and DVT on Coumadin, chronic anemia, history of CHF was recently admitted for abdominal pain likely from gastritis had dialysis on Sunday and had a AV fistula on the left groin was cannulated.  Following which patient started to have increasing pain to the point now that patient has severe pain on even minimal movement of the left lower extremity.  Also has a ecchymotic area and indurated area in the left groin area. Patient is Coumadin has been held due to elevated INR and has not taken Coumadin over the weekend. Due to worsening pain patient was referred to the ER.  Subjective: No complaints this morning, pain improved. Still feels a nod on her groin   Assessment & Plan: Left groin pain due to pseudoaneurysm from AV fistula and Hematoma  S/p covered stent in left thigh AV graft  Apply warm compresses   History of DVT and protein C deficiency Resume Warfarin tonight 24 hrs s/p surgical procedure   Diabetes mellitus type 2 CBG's stable Continue current regimen   End-stage renal disease on HD MWF  Continue HD per nephrology  HD tomorrow the d/c home   DVT prophylaxis: SCD's  Code Status: DNR   Family Communication: None at bedside Disposition Plan: Home after HD in AM   Consultants:   Nephrology   Vascular Surgery   Procedures:   None   Antimicrobials:  None     Objective: Vitals:   05/10/17 2146 05/10/17 2147 05/11/17 0207 05/11/17 0938  BP: (!) 123/54  (!) 127/40 131/62  Pulse: 64 62 70 67  Resp: (!) 9 13 16 17   Temp: (!) 97.4 F (36.3 C)  97.9 F (36.6 C) 98.4 F (36.9 C)  TempSrc:   Oral Oral  SpO2: 99% 99% 100% 100%  Weight:      Height:        Intake/Output Summary (Last 24 hours) at  05/11/2017 1437 Last data filed at 05/11/2017 1034 Gross per 24 hour  Intake 1319.67 ml  Output 0 ml  Net 1319.67 ml   Filed Weights   05/10/17 0024 05/10/17 1855  Weight: 98.5 kg (217 lb 1.6 oz) 98.4 kg (217 lb)    Examination:  General: Pt is alert, awake, not in acute distress Cardiovascular: RRR, S1/S2 +, no rubs, no gallops Respiratory: CTA bilaterally, no wheezing, no rhonchi Abdominal: Soft, NT, ND, bowel sounds + Extremities: Large hematoma, great palpable thrill. No edema   Data Reviewed: I have personally reviewed following labs and imaging studies  CBC: Recent Labs  Lab 05/09/17 1820 05/10/17 0133 05/10/17 0554 05/10/17 1024 05/11/17 0410  WBC 6.4 5.3 5.1 4.7 5.4  NEUTROABS 3.6  --   --   --  2.7  HGB 9.2* 8.8* 8.5* 8.9* 8.8*  HCT 28.6* 27.0* 26.3* 27.7* 27.4*  MCV 77.5* 76.7* 77.8* 78.0 77.6*  PLT 311 288 273 290 194   Basic Metabolic Panel: Recent Labs  Lab 05/09/17 1820 05/10/17 0554 05/11/17 0410  NA 130* 133* 135  K 4.1 4.0 4.4  CL 94* 96* 100*  CO2 25 27 25   GLUCOSE 344* 251* 243*  BUN 44* 47* 50*  CREATININE 5.53* 5.65* 5.47*  CALCIUM 8.3* 8.2* 8.0*   GFR: Estimated Creatinine Clearance: 9.4 mL/min (  A) (by C-G formula based on SCr of 5.47 mg/dL (H)). Liver Function Tests: Recent Labs  Lab 05/09/17 1820  AST 17  ALT 9*  ALKPHOS 77  BILITOT 0.5  PROT 6.3*  ALBUMIN 3.1*   No results for input(s): LIPASE, AMYLASE in the last 168 hours. No results for input(s): AMMONIA in the last 168 hours. Coagulation Profile: Recent Labs  Lab 05/09/17 1820 05/10/17 0554 05/10/17 2234  INR 2.04 2.19 1.97   Cardiac Enzymes: No results for input(s): CKTOTAL, CKMB, CKMBINDEX, TROPONINI in the last 168 hours. BNP (last 3 results) No results for input(s): PROBNP in the last 8760 hours. HbA1C: No results for input(s): HGBA1C in the last 72 hours. CBG: Recent Labs  Lab 05/10/17 1839 05/10/17 2104 05/11/17 0213 05/11/17 0818 05/11/17 1244    GLUCAP 111* 114* 155* 242* 298*   Lipid Profile: No results for input(s): CHOL, HDL, LDLCALC, TRIG, CHOLHDL, LDLDIRECT in the last 72 hours. Thyroid Function Tests: No results for input(s): TSH, T4TOTAL, FREET4, T3FREE, THYROIDAB in the last 72 hours. Anemia Panel: No results for input(s): VITAMINB12, FOLATE, FERRITIN, TIBC, IRON, RETICCTPCT in the last 72 hours. Sepsis Labs: No results for input(s): PROCALCITON, LATICACIDVEN in the last 168 hours.  Recent Results (from the past 240 hour(s))  Surgical PCR screen     Status: None   Collection Time: 05/10/17  5:03 PM  Result Value Ref Range Status   MRSA, PCR NEGATIVE NEGATIVE Final   Staphylococcus aureus NEGATIVE NEGATIVE Final    Comment: (NOTE) The Xpert SA Assay (FDA approved for NASAL specimens in patients 7 years of age and older), is one component of a comprehensive surveillance program. It is not intended to diagnose infection nor to guide or monitor treatment.       Radiology Studies: No results found.    Scheduled Meds: . calcitRIOL  0.5 mcg Oral Daily  . carvedilol  6.25 mg Oral BID WC  . [START ON 05/12/2017] darbepoetin (ARANESP) injection - DIALYSIS  200 mcg Intravenous Q Fri-HD  . docusate sodium  100 mg Oral BID  . famotidine  20 mg Oral Daily  . feeding supplement (NEPRO CARB STEADY)  237 mL Oral TID  . feeding supplement (PRO-STAT SUGAR FREE 64)  30 mL Oral BID  . insulin aspart  0-9 Units Subcutaneous TID WC  . linaclotide  145 mcg Oral QAC breakfast  . midodrine  10 mg Oral Q M,W,F  . multivitamin  1 tablet Oral QHS  . pantoprazole  40 mg Oral BID AC  . PARoxetine  10 mg Oral Daily  . polyethylene glycol  17 g Oral Daily  . simvastatin  40 mg Oral Daily  . sucralfate  1 g Oral TID AC & HS   Continuous Infusions: . sodium chloride 10 mL/hr at 05/10/17 2302     LOS: 1 day   Time spent: Total of 15 minutes spent with pt, greater than 50% of which was spent in discussion of  treatment,  counseling and coordination of care   Chipper Oman, MD Pager: Text Page via [email or web address]   If 7PM-7AM, please contact night-coverage [email or web address] 05/11/2017, 2:37 PM

## 2017-05-11 NOTE — Progress Notes (Addendum)
ANTICOAGULATION CONSULT NOTE - Initial Consult  Pharmacy Consult for Warfarin Indication: protein C deficiency and DVT   No Known Allergies  Patient Measurements: Height: 5\' 1"  (154.9 cm) Weight: 217 lb (98.4 kg) IBW/kg (Calculated) : 47.8  Vital Signs: Temp: 98.4 F (36.9 C) (11/22 0938) Temp Source: Oral (11/22 0938) BP: 131/62 (11/22 0938) Pulse Rate: 67 (11/22 0938)  Labs: Recent Labs    05/09/17 1820  05/10/17 0554 05/10/17 1024 05/10/17 2234 05/11/17 0410  HGB 9.2*   < > 8.5* 8.9*  --  8.8*  HCT 28.6*   < > 26.3* 27.7*  --  27.4*  PLT 311   < > 273 290  --  292  LABPROT 22.9*  --  24.2*  --  22.2*  --   INR 2.04  --  2.19  --  1.97  --   CREATININE 5.53*  --  5.65*  --   --  5.47*   < > = values in this interval not displayed.    Estimated Creatinine Clearance: 9.4 mL/min (A) (by C-G formula based on SCr of 5.47 mg/dL (H)).   Medical History: Past Medical History:  Diagnosis Date  . CHF (congestive heart failure) ([place])   . Diabetes mellitus without complication ([place])   . ESRD (end stage renal disease) on dialysis ([place])   . History of pituitary adenoma   . Hypertension   . Protein C deficiency ([place])     Assessment: Pt admitted with pseudoaneurysm of the AV fistula vs expanding hematoma (L thigh), now s/p covered stent in left thigh AV graft. Pt states taken off warfarin~3wk ago. Per [facility] pt still on warfarin 4mg  daily w/ last dose 11/20 (protein C deficiency and DVTs). CBC stable today. INR is slightly subtherapeutic today at 1.97.  PTA warfarin dose: 4 mg daily per SNF MAR, last dose 11/20  Goal of Therapy:  INR 2-3 Monitor platelets by anticoagulation protocol: Yes   Plan:  Give warfarin 5 mg po x 1 Monitor daily INR, CBC, clinical course, s/sx of bleed, PO intake, DDI Resuming warfarin tonight 24 hrs s/p surgical procedure    Thank you for allowing [place] to participate in this patients care.  Jens Som, PharmD Clinical phone for  05/11/2017 from 7a-3:30p: [phone] If after 3:30p, please call main pharmacy at: [phone] 05/11/2017 2:52 PM

## 2017-05-11 NOTE — Consult Note (Signed)
[facility] Renal Consultation Note    Indication for Consultation:  Management of ESRD/hemodialysis, anemia, hypertension/volume, and secondary hyperparathyroidism. PCP:  HPI: Ashlee Miles is a 76 y.o. female with ESRD, HTN. 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  Ashlee Miles was in her usual state of health until after last dialysis on Sunday 11/18 (holiday schedule). Afterwards, she developed severe pain in her L leg which worsened to the point that she was unable to put any pressure on the leg. She presented to the ED, noted to have large hematoma v. aneurysmal area on the medial side of her thigh AVG.  INR was therapeutic at 2.04. VVS consulted and ordered ultrasound which showed pseudoaneurysm. She was then taken to the OR on 11/21 for L thigh shuntogram and covered stent placement of the pseudoaneurysm.  Today, she feels ok. Still having some L leg pain, but says it is improved from prior. Denies CP, but having DOE. No dyspnea at rest. No abdominal pain, N/V/D, or fever.  From renal standpoint, she usually dialyzes on MWF at [facility]. Her last HD was on 11/18. Fortunately, her K remains controlled today.  Past Medical History:  Diagnosis Date  . CHF (congestive heart failure) ([place])   . Diabetes mellitus without complication ([place])   . ESRD (end stage renal disease) on dialysis ([place])   . History of pituitary adenoma   . Hypertension   . Protein C deficiency [facility])    Past Surgical History:  Procedure Laterality Date  . AV FISTULA PLACEMENT Left 03/16/2017   Procedure: INSERTION OF ARTERIOVENOUS (AV) GORE-TEX GRAFT THIGH-LEFT;  Surgeon: Angelia Mould, MD;  Location: [place];  Service: Vascular;  Laterality: Left;  . CHOLECYSTECTOMY    . DIALYSIS FISTULA CREATION      clotted off  . INSERTION OF DIALYSIS CATHETER Right 03/16/2017   Procedure: INSERTION OF RIGHT FEMORAL TUNNELED DIALYSIS CATHETER;  Surgeon: Angelia Mould, MD;  Location: [place];  Service: Vascular;  Laterality: Right;  . IVC FILTER INSERTION    . PORTACATH PLACEMENT     Family History  Problem Relation Age of Onset  . Breast cancer Mother   . Diabetes Sister   . Diabetes Brother   . CAD Other   . Stroke Neg Hx    Social History:  reports that  has never smoked. she has never used smokeless tobacco. She reports that she does not drink alcohol or use drugs.  ROS: As per HPI otherwise negative.  Physical Exam: Vitals:   05/10/17 2146 05/10/17 2147 05/11/17 0207 05/11/17 0938  BP: (!) 123/54  (!) 127/40 131/62  Pulse: 64 62 70 67  Resp: (!) 9 13 16 17   Temp: (!) 97.4 F (36.3 C)  97.9 F (36.6 C) 98.4 F (36.9 C)  TempSrc:   Oral Oral  SpO2: 99% 99% 100% 100%  Weight:      Height:         General: Well developed, well nourished, in no acute distress. On room air. Head: Normocephalic, atraumatic, sclera non-icteric, mucus membranes are moist. Neck: Supple without lymphadenopathy/masses. JVD not elevated. Lungs: Clear bilaterally to auscultation without wheezes, rales, or rhonchi. Breathing is unlabored. Heart: RRR with normal S1, S2. No murmurs, rubs, or gallops appreciated. Abdomen: Soft, non-tender, non-distended with normoactive bowel sounds. No  rebound/guarding. Musculoskeletal:  Strength and tone appear normal for age. Lower extremities: Trace B LE edema, mild generalized tenderness of B legs. L AVG + bruit. Neuro: Alert and oriented X 3. Moves all extremities spontaneously. Psych:  Responds to questions appropriately with a normal affect. Dialysis Access: L thigh AVG + bruit  No Known Allergies Prior to Admission medications   Medication Sig Start Date End Date Taking? Authorizing Provider  acetaminophen (TYLENOL) 325 MG tablet Take 650 mg by mouth every 6  (six) hours as needed for mild pain.   Yes [provider]  Amino Acids-Protein Hydrolys (FEEDING SUPPLEMENT, PRO-STAT SUGAR FREE 64,) LIQD Take 30 mLs by mouth 2 (two) times daily. 04/12/17  Yes Sheikh, Omair Latif, DO  amoxicillin (AMOXIL) 500 MG tablet Take 1,000 mg by mouth 2 (two) times daily. FOR 14 DAYS   Yes [provider]  aspirin 325 MG EC tablet Take 325 mg daily by mouth.   Yes [provider]  calcitRIOL (ROCALTROL) 0.5 MCG capsule Take 0.5 mcg by mouth daily.   Yes [provider]  camphor-menthol Timoteo Ace) lotion Apply 1 application topically every 8 (eight) hours as needed for itching. 04/12/17  Yes Sheikh, Omair Latif, DO  carvedilol (COREG) 6.25 MG tablet Take 1 tablet (6.25 mg total) by mouth 2 (two) times daily. 03/04/17 03/04/18 Yes Hosie Poisson, MD  clarithromycin (BIAXIN) 500 MG tablet Take 500 mg by mouth 2 (two) times daily. FOR 14 DAYS 05/02/17 05/16/17 Yes [provider]  docusate sodium (COLACE) 100 MG capsule Take 1 capsule (100 mg total) by mouth 2 (two) times daily. 04/12/17  Yes Sheikh, Omair Latif, DO  famotidine (PEPCID) 20 MG tablet Take 1 tablet (20 mg total) daily by mouth. 04/28/17  Yes Lavina Hamman, MD  HYDROcodone-acetaminophen (NORCO/VICODIN) 5-325 MG tablet Take 1 tablet 3 (three) times daily as needed by mouth for severe pain. Patient taking differently: Take 1 tablet by mouth every 8 (eight) hours as needed (for pain).  04/28/17  Yes Lavina Hamman, MD  hydrOXYzine (ATARAX/VISTARIL) 25 MG tablet Take 1 tablet (25 mg total) by mouth every 8 (eight) hours as needed for itching. 04/12/17  Yes Sheikh, Omair Latif, DO  insulin aspart (NOVOLOG) 100 UNIT/ML injection Inject 0-9 Units into the skin 3 (three) times daily with meals. Patient taking differently: Inject 0-9 Units into the skin 3 (three) times daily with meals. Per sliding scale: BGL 110-149 = 0 units; 150-200 = 2 units; 201-250 = 4 units; 251-300 = 6 units;  301-350 = 8 units; 351-400 = 9 units; 401-450 = CALL MD 03/22/17  Yes Debbe Odea, MD  ipratropium-albuterol (DUONEB) 0.5-2.5 (3) MG/3ML SOLN Take 3 mLs by nebulization every 4 (four) hours as needed. Patient taking differently: Take 3 mLs by nebulization every 4 (four) hours as needed (for shortness of breath).  04/12/17  Yes Sheikh, Omair Latif, DO  linaclotide (LINZESS) 145 MCG CAPS capsule Take 145 mcg by mouth daily before breakfast.   Yes [provider]  methocarbamol (ROBAXIN) 500 MG tablet Take 1 tablet (500 mg total) every 8 (eight) hours as needed by mouth for muscle spasms. 04/28/17  Yes Lavina Hamman, MD  midodrine (PROAMATINE) 10 MG tablet Take 1 tablet (10 mg total) by mouth every Monday, Wednesday, and Friday with hemodialysis. Patient taking differently: Take 10 mg by mouth every Monday, Wednesday, and Friday.  04/14/17  Yes Sheikh, Omair Latif, DO  multivitamin (RENA-VIT) TABS tablet Take 1 tablet by mouth at bedtime.  04/12/17  Yes Sheikh, Omair Latif, DO  Nutritional Supplements (FEEDING SUPPLEMENT, NEPRO CARB STEADY,) LIQD Take 237 mLs by mouth 3 (three) times daily as needed (Supplement). Patient taking differently: Take 237 mLs by mouth 3 (three) times daily.  04/12/17  Yes Sheikh, Omair Latif, DO  pantoprazole (PROTONIX) 40 MG tablet Take 1 tablet (40 mg total) 2 (two) times daily before a meal by mouth. 04/28/17  Yes Lavina Hamman, MD  PARoxetine (PAXIL) 10 MG tablet Take 1 tablet (10 mg total) daily by mouth. 04/28/17  Yes Lavina Hamman, MD  polyethylene glycol [facility] / GLYCOLAX) packet Take 17 g daily by mouth. 04/28/17  Yes Lavina Hamman, MD  sucralfate (CARAFATE) 1 GM/10ML suspension Take 10 mLs (1 g total) 4 (four) times daily -  with meals and at bedtime by mouth. Patient taking differently: Take 1 g by mouth 4 (four) times daily. WITH A MEAL OR SNACK 04/28/17  Yes Lavina Hamman, MD  triamcinolone cream (KENALOG) 0.1 % Apply 1 application topically 2  (two) times daily. APPLY TO HAND(S) AS DIRECTED   Yes [provider]  warfarin (COUMADIN) 4 MG tablet Take 4 mg by mouth daily.   Yes [provider]  simvastatin (ZOCOR) 40 MG tablet Take 1 tablet (40 mg total) by mouth daily. 03/05/17   Hosie Poisson, MD  warfarin (COUMADIN) 5 MG tablet Take 1 tablet (5 mg total) by mouth daily at 6 PM. Patient not taking: Reported on 05/09/2017 04/12/17   Kerney Elbe, DO   Current Facility-Administered Medications  Medication Dose Route Frequency Provider Last Rate Last Dose  . 0.9 %  sodium chloride infusion   Intravenous Continuous Effie Berkshire, MD 10 mL/hr at 05/10/17 2302    . acetaminophen (TYLENOL) tablet 650 mg  650 mg Oral Q6H PRN Rise Patience, MD       Or  . acetaminophen (TYLENOL) suppository 650 mg  650 mg Rectal Q6H PRN Rise Patience, MD      . calcitRIOL (ROCALTROL) capsule 0.5 mcg  0.5 mcg Oral Daily Rise Patience, MD   0.5 mcg at 05/11/17 0906  . carvedilol (COREG) tablet 6.25 mg  6.25 mg Oral BID WC Rise Patience, MD   6.25 mg at 05/11/17 0909  . docusate sodium (COLACE) capsule 100 mg  100 mg Oral BID Rise Patience, MD   100 mg at 05/10/17 1036  . famotidine (PEPCID) tablet 20 mg  20 mg Oral Daily Rise Patience, MD   20 mg at 05/11/17 0909  . feeding supplement (NEPRO CARB STEADY) liquid 237 mL  237 mL Oral TID Rise Patience, MD      . feeding supplement (PRO-STAT SUGAR FREE 64) liquid 30 mL  30 mL Oral BID Rise Patience, MD      . HYDROcodone-acetaminophen (NORCO/VICODIN) 5-325 MG per tablet 1 tablet  1 tablet Oral Q8H PRN Rise Patience, MD   1 tablet at 05/11/17 1962  . hydrOXYzine (ATARAX/VISTARIL) tablet 25 mg  25 mg Oral Q8H PRN Rise Patience, MD      . insulin aspart (novoLOG) injection 0-9 Units  0-9 Units Subcutaneous TID WC Rise Patience, MD   3 Units at 05/10/17 1338  . ipratropium-albuterol (DUONEB) 0.5-2.5 (3) MG/3ML  nebulizer solution 3 mL  3 mL Nebulization Q4H PRN Rise Patience, MD      . linaclotide [facility]) capsule 145 mcg  145 mcg Oral QAC breakfast Hal Hope,  Doreatha Lew, MD   145 mcg at 05/11/17 3016  . methocarbamol (ROBAXIN) tablet 500 mg  500 mg Oral Q8H PRN Rise Patience, MD   500 mg at 05/11/17 0109  . midodrine (PROAMATINE) tablet 10 mg  10 mg Oral Q M,W,F Rise Patience, MD   10 mg at 05/10/17 1036  . multivitamin (RENA-VIT) tablet 1 tablet  1 tablet Oral QHS Rise Patience, MD   1 tablet at 05/10/17 0038  . ondansetron (ZOFRAN) tablet 4 mg  4 mg Oral Q6H PRN Rise Patience, MD       Or  . ondansetron [facility]) injection 4 mg  4 mg Intravenous Q6H PRN Rise Patience, MD      . pantoprazole (PROTONIX) EC tablet 40 mg  40 mg Oral BID AC Rise Patience, MD   40 mg at 05/11/17 0908  . PARoxetine (PAXIL) tablet 10 mg  10 mg Oral Daily Rise Patience, MD   10 mg at 05/11/17 0912  . polyethylene glycol (MIRALAX / GLYCOLAX) packet 17 g  17 g Oral Daily Rise Patience, MD      . simvastatin (ZOCOR) tablet 40 mg  40 mg Oral Daily Rise Patience, MD   40 mg at 05/11/17 0912  . sodium chloride flush (NS) 0.9 % injection 10-40 mL  10-40 mL Intracatheter PRN Cardama, Grayce Sessions, MD   10 mL at 05/10/17 1736  . sucralfate (CARAFATE) 1 GM/10ML suspension 1 g  1 g Oral TID AC & HS Rise Patience, MD   1 g at 05/11/17 3235   Labs: Basic Metabolic Panel: Recent Labs  Lab 05/09/17 1820 05/10/17 0554 05/11/17 0410  NA 130* 133* 135  K 4.1 4.0 4.4  CL 94* 96* 100*  CO2 25 27 25   GLUCOSE 344* 251* 243*  BUN 44* 47* 50*  CREATININE 5.53* 5.65* 5.47*  CALCIUM 8.3* 8.2* 8.0*   Liver Function Tests: Recent Labs  Lab 05/09/17 1820  AST 17  ALT 9*  ALKPHOS 77  BILITOT 0.5  PROT 6.3*  ALBUMIN 3.1*   CBC: Recent Labs  Lab 05/09/17 1820 05/10/17 0133 05/10/17 0554 05/10/17 1024 05/11/17 0410  WBC 6.4 5.3 5.1 4.7 5.4  NEUTROABS  3.6  --   --   --  2.7  HGB 9.2* 8.8* 8.5* 8.9* 8.8*  HCT 28.6* 27.0* 26.3* 27.7* 27.4*  MCV 77.5* 76.7* 77.8* 78.0 77.6*  PLT 311 288 273 290 292   CBG: Recent Labs  Lab 05/10/17 1720 05/10/17 1839 05/10/17 2104 05/11/17 0213 05/11/17 0818  GLUCAP 83 111* 114* 155* 242*   Dialysis Orders:  MWF at [facility] 4hr, BFR 400, DFR 800, EDW 99.5kg, 3K/2.25Ca, thigh AVG, heparin 3000 bolus - Hectoral 66mcg IV q HD - Mircera 152mcg IV q 2 weeks (last 11/2) - No binders  Assessment/Plan: 1.  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 2.  ESRD: Usually MWF schedule, but last dialyzed Sunday. K/CO2 ok, having some DOE but no acute dyspnea, so will plan on her next HD tomorrow morning. 3.  Hypertension/volume: BP controlled, slightly up volume wise on exam. UF with HD tomorrow. 4.  Anemia: Hgb 8.8, will re-dose her ESA tomorrow (Aranesp 200 ordered) 5.  Metabolic bone disease: Ca ok, continue VDRA. Phos pending. 6.  Nutrition: Alb 3.1. Adding Nepro. 7.  Type 2 DM: Per primary. 8. 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  Veneta Penton, PA-C  05/11/2017, 10:52 AM  [facility] Pager: [phone]  Patient seen and examined, agree with above note with above modifications. Alert, talking on the phone to her daughter, some pain to leg but improved.  Plan for next HD tomorrow on schedule- should not need extra HD.  AVG is patent  Corliss Parish, MD 05/11/2017

## 2017-05-11 NOTE — Progress Notes (Signed)
   VASCULAR SURGERY ASSESSMENT & PLAN:   1 Day Post-Op s/p: Placement of covered stent in left thigh AV graft for a pseudoaneurysm.  Graft has an excellent thrill.  Vascular surgery will be available as needed.  SUBJECTIVE:   No complaints  PHYSICAL EXAM:   Vitals:   05/10/17 2143 05/10/17 2146 05/10/17 2147 05/11/17 0207  BP:  (!) 123/54  (!) 127/40  Pulse: 62 64 62 70  Resp: 12 (!) 9 13 16   Temp:  (!) 97.4 F (36.3 C)  97.9 F (36.6 C)  TempSrc:    Oral  SpO2: 100% 99% 99% 100%  Weight:      Height:       Good thrill in left thigh AV graft.  LABS:   Lab Results  Component Value Date   WBC 5.4 05/11/2017   HGB 8.8 (L) 05/11/2017   HCT 27.4 (L) 05/11/2017   MCV 77.6 (L) 05/11/2017   PLT 292 05/11/2017   Lab Results  Component Value Date   CREATININE 5.47 (H) 05/11/2017   Lab Results  Component Value Date   INR 1.97 05/10/2017   CBG (last 3)  Recent Labs    05/10/17 2104 05/11/17 0213 05/11/17 0818  GLUCAP 114* 155* 242*    PROBLEM LIST:    Principal Problem:   Left leg swelling Active Problems:   Diabetes mellitus with complication (HCC)   Essential hypertension   Chronic diastolic CHF (congestive heart failure) (HCC)   DVT (deep venous thrombosis) (HCC)   ESRD on dialysis ([place])   Anemia due to chronic kidney disease   CURRENT MEDS:   . calcitRIOL  0.5 mcg Oral Daily  . carvedilol  6.25 mg Oral BID WC  . docusate sodium  100 mg Oral BID  . famotidine  20 mg Oral Daily  . feeding supplement (NEPRO CARB STEADY)  237 mL Oral TID  . feeding supplement (PRO-STAT SUGAR FREE 64)  30 mL Oral BID  . insulin aspart  0-9 Units Subcutaneous TID WC  . linaclotide  145 mcg Oral QAC breakfast  . midodrine  10 mg Oral Q M,W,F  . multivitamin  1 tablet Oral QHS  . pantoprazole  40 mg Oral BID AC  . PARoxetine  10 mg Oral Daily  . polyethylene glycol  17 g Oral Daily  . simvastatin  40 mg Oral Daily  . sucralfate  1 g Oral TID [facility]     [name] Beeper: [phone] Office: [phone] 05/11/2017

## 2017-05-12 ENCOUNTER — Encounter (HOSPITAL_COMMUNITY): Payer: Self-pay | Admitting: Surgery

## 2017-05-12 LAB — CBC
HCT: 28.4 % — ABNORMAL LOW (ref 36.0–46.0)
Hemoglobin: 9.1 g/dL — ABNORMAL LOW (ref 12.0–15.0)
MCH: 24.9 pg — ABNORMAL LOW (ref 26.0–34.0)
MCHC: 32 g/dL (ref 30.0–36.0)
MCV: 77.6 fL — ABNORMAL LOW (ref 78.0–100.0)
Platelets: 250 10*3/uL (ref 150–400)
RBC: 3.66 MIL/uL — ABNORMAL LOW (ref 3.87–5.11)
RDW: 16 % — ABNORMAL HIGH (ref 11.5–15.5)
WBC: 4.7 10*3/uL (ref 4.0–10.5)

## 2017-05-12 LAB — RENAL FUNCTION PANEL
Albumin: 3 g/dL — ABNORMAL LOW (ref 3.5–5.0)
Anion gap: 8 (ref 5–15)
BUN: 47 mg/dL — ABNORMAL HIGH (ref 6–20)
CO2: 26 mmol/L (ref 22–32)
Calcium: 8.5 mg/dL — ABNORMAL LOW (ref 8.9–10.3)
Chloride: 103 mmol/L (ref 101–111)
Creatinine, Ser: 4.39 mg/dL — ABNORMAL HIGH (ref 0.44–1.00)
GFR calc Af Amer: 10 mL/min — ABNORMAL LOW (ref >60)
GFR calc non Af Amer: 9 mL/min — ABNORMAL LOW (ref >60)
Glucose, Bld: 169 mg/dL — ABNORMAL HIGH (ref 65–99)
Phosphorus: 2.9 mg/dL (ref 2.5–4.6)
Potassium: 3.9 mmol/L (ref 3.5–5.1)
Sodium: 137 mmol/L (ref 135–145)

## 2017-05-12 LAB — PROTIME-INR
INR: 1.52
Prothrombin Time: 18.2 seconds — ABNORMAL HIGH (ref 11.4–15.2)

## 2017-05-12 LAB — GLUCOSE, CAPILLARY
Glucose-Capillary: 262 mg/dL — ABNORMAL HIGH (ref 65–99)
Glucose-Capillary: 275 mg/dL — ABNORMAL HIGH (ref 65–99)
Glucose-Capillary: 197 mg/dL — ABNORMAL HIGH (ref 65–99)
Glucose-Capillary: 302 mg/dL — ABNORMAL HIGH (ref 65–99)

## 2017-05-12 MED ORDER — ACETAMINOPHEN 325 MG PO TABS
ORAL_TABLET | ORAL | Status: AC
  Administered 2017-05-12: 18:00:00
  Filled 2017-05-12: qty 2

## 2017-05-12 MED ORDER — SODIUM CHLORIDE 0.9 % IV SOLN: 100.0000 mL | INTRAVENOUS | Status: DC | PRN

## 2017-05-12 MED ORDER — HEPARIN SODIUM (PORCINE) 1000 UNIT/ML DIALYSIS: 20.0000 [IU]/kg | INTRAMUSCULAR | Status: DC | PRN

## 2017-05-12 MED ORDER — WARFARIN SODIUM 7.5 MG PO TABS
7.5000 mg | ORAL_TABLET | Freq: Once | ORAL | Status: AC
  Administered 2017-05-12: 7.5 mg via ORAL
  Filled 2017-05-12: qty 1

## 2017-05-12 MED ORDER — WARFARIN SODIUM 7.5 MG PO TABS
7.5000 mg | ORAL_TABLET | Freq: Every day | ORAL | 0 refills | Status: DC
Start: 2017-05-12 — End: 2017-05-12

## 2017-05-12 MED ORDER — ASPIRIN 325 MG PO TBEC: 325.0000 mg | DELAYED_RELEASE_TABLET | Freq: Every day | ORAL | 0 refills | Status: AC

## 2017-05-12 MED ORDER — PENTAFLUOROPROP-TETRAFLUOROETH EX AERO: 1.0000 "application " | INHALATION_SPRAY | CUTANEOUS | Status: DC | PRN

## 2017-05-12 MED ORDER — LIDOCAINE HCL (PF) 1 % IJ SOLN: 5.0000 mL | INTRAMUSCULAR | Status: DC | PRN

## 2017-05-12 MED ORDER — HEPARIN SODIUM (PORCINE) 1000 UNIT/ML DIALYSIS: 1000.0000 [IU] | INTRAMUSCULAR | Status: DC | PRN

## 2017-05-12 MED ORDER — ALTEPLASE 2 MG IJ SOLR: 2.0000 mg | Freq: Once | INTRAMUSCULAR | Status: DC | PRN

## 2017-05-12 MED ORDER — LIDOCAINE-PRILOCAINE 2.5-2.5 % EX CREA: 1.0000 "application " | TOPICAL_CREAM | CUTANEOUS | Status: DC | PRN

## 2017-05-12 MED ORDER — DARBEPOETIN ALFA 200 MCG/0.4ML IJ SOSY
PREFILLED_SYRINGE | INTRAMUSCULAR | Status: AC
  Filled 2017-05-12: qty 0.4

## 2017-05-12 MED ORDER — WARFARIN SODIUM 5 MG PO TABS: 5.0000 mg | ORAL_TABLET | Freq: Every day | ORAL | 0 refills | Status: DC

## 2017-05-12 MED ORDER — HEPARIN SOD (PORK) LOCK FLUSH 100 UNIT/ML IV SOLN
500.0000 [IU] | INTRAVENOUS | Status: AC | PRN
  Administered 2017-05-12: 500 [IU]

## 2017-05-12 NOTE — NC FL2 (Signed)
[place] LEVEL OF CARE SCREENING TOOL     IDENTIFICATION  Patient Name: Ashlee Miles Birthdate: Jun 13, 1941 Sex: female Admission Date (Current Location): 05/09/2017  [facility] and [place] Number:  Herbalist and Address:  [facility] [place], [place] [address], [place], [place] [address]      Provider Number: [id number]  Attending Physician Name and Address:  Patrecia Pour, Christean Grief, MD  Relative Name and Phone Number:       Current Level of Care: Hospital Recommended Level of Care: [place] Prior Approval Number:    Date Approved/Denied:   PASRR Number: [id number]  Discharge Plan: SNF    Current Diagnoses: Patient Active Problem List   Diagnosis Date Noted  . Left leg swelling 05/09/2017  . 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 Falls 04/25/2017  . Hypotension 04/25/2017  . Epigastric pain 04/25/2017  . Constipation 04/25/2017  . ESRD (end stage renal disease) on dialysis ([place])   . ESRD on dialysis ([place]) 04/06/2017  . Anemia due to chronic kidney disease 04/06/2017  . Thrombocytopenia ([place]) 04/06/2017  . Cough in adult 04/06/2017  . DVT (deep vein thrombosis) in pregnancy ([place]) 04/06/2017  . DVT (deep venous thrombosis) ([place]) 04/05/2017  . Pressure injury of skin 03/14/2017  . Acute metabolic encephalopathy 93/57/0177  . Skin lesion of hand 03/12/2017  . TIA (transient ischemic attack) 03/01/2017  . Syncope 03/01/2017  . Diabetes mellitus with complication ([place]) 93/90/3009  . Essential hypertension 03/01/2017  . Chronic diastolic CHF (congestive heart failure) ([place]) 03/01/2017    Orientation RESPIRATION BLADDER Height & Weight     Self, Time, Situation, Place  Normal Continent Weight: (P) 98.9 kg (218 lb 0.6 oz) Height:  5\' 1"  (154.9 cm)  BEHAVIORAL SYMPTOMS/MOOD NEUROLOGICAL BOWEL NUTRITION STATUS      Continent Diet(Please see DC Summary)  AMBULATORY STATUS COMMUNICATION OF NEEDS Skin   Limited Assist  Verbally PU Stage and Appropriate Care, Surgical wounds(Pressure injury stage I on sacrum;closed incision on groin)                       Personal Care Assistance Level of Assistance  Bathing, Feeding, Dressing Bathing Assistance: Limited assistance Feeding assistance: Independent Dressing Assistance: Limited assistance     Functional Limitations Info  Sight, Hearing, Speech Sight Info: Impaired Hearing Info: Impaired Speech Info: Adequate    SPECIAL CARE FACTORS FREQUENCY  PT (By licensed PT), OT (By licensed OT)     PT Frequency: 5x/week OT Frequency: 3x/week            Contractures Contractures Info: Not present    Additional Factors Info  Code Status, Allergies, Psychotropic, Insulin Sliding Scale Code Status Info: DNR Allergies Info: NKA Psychotropic Info: Paxil 10mg  daily Insulin Sliding Scale Info: 3x/day with meals and at bedtime       Current Medications (05/12/2017):  This is the current hospital active medication list Current Facility-Administered Medications  Medication Dose Route Frequency Provider Last Rate Last Dose  . 0.9 %  sodium chloride infusion   Intravenous Continuous Effie Berkshire, MD 10 mL/hr at 05/10/17 2302    . 0.9 %  sodium chloride infusion  100 mL Intravenous PRN Loren Racer, PA-C      . 0.9 %  sodium chloride infusion  100 mL Intravenous PRN Loren Racer, PA-C      . acetaminophen (TYLENOL) tablet 650 mg  650 mg Oral Q6H PRN Rise Patience, MD  Or  . acetaminophen (TYLENOL) suppository 650 mg  650 mg Rectal Q6H PRN Rise Patience, MD      . alteplase (CATHFLO ACTIVASE) injection 2 mg  2 mg Intracatheter Once PRN Loren Racer, PA-C      . calcitRIOL (ROCALTROL) capsule 0.5 mcg  0.5 mcg Oral Daily Rise Patience, MD   0.5 mcg at 05/12/17 0939  . carvedilol (COREG) tablet 6.25 mg  6.25 mg Oral BID WC Rise Patience, MD   6.25 mg at 05/12/17 0940  . Darbepoetin Alfa (ARANESP) injection  200 mcg  200 mcg Intravenous Q Fri-HD Stovall, Kathryn R, PA-C      . docusate sodium (COLACE) capsule 100 mg  100 mg Oral BID Rise Patience, MD   100 mg at 05/12/17 0941  . famotidine (PEPCID) tablet 20 mg  20 mg Oral Daily Rise Patience, MD   20 mg at 05/12/17 0940  . feeding supplement (NEPRO CARB STEADY) liquid 237 mL  237 mL Oral TID Rise Patience, MD      . feeding supplement (PRO-STAT SUGAR FREE 64) liquid 30 mL  30 mL Oral BID Rise Patience, MD   30 mL at 05/11/17 2223  . heparin injection 1,000 Units  1,000 Units Dialysis PRN Loren Racer, PA-C      . heparin injection 2,000 Units  20 Units/kg Dialysis PRN Loren Racer, PA-C      . HYDROcodone-acetaminophen (NORCO/VICODIN) 5-325 MG per tablet 1 tablet  1 tablet Oral Q8H PRN Rise Patience, MD   1 tablet at 05/12/17 0947  . hydrOXYzine (ATARAX/VISTARIL) tablet 25 mg  25 mg Oral Q8H PRN Rise Patience, MD      . insulin aspart (novoLOG) injection 0-9 Units  0-9 Units Subcutaneous TID WC Rise Patience, MD   5 Units at 05/12/17 0940  . ipratropium-albuterol (DUONEB) 0.5-2.5 (3) MG/3ML nebulizer solution 3 mL  3 mL Nebulization Q4H PRN Rise Patience, MD      . lidocaine (PF) (XYLOCAINE) 1 % injection 5 mL  5 mL Intradermal PRN Loren Racer, PA-C      . lidocaine-prilocaine (EMLA) cream 1 application  1 application Topical PRN Loren Racer, PA-C      . linaclotide (LINZESS) capsule 145 mcg  145 mcg Oral QAC breakfast Rise Patience, MD   145 mcg at 05/12/17 9563  . methocarbamol (ROBAXIN) tablet 500 mg  500 mg Oral Q8H PRN Rise Patience, MD   500 mg at 05/11/17 8756  . midodrine (PROAMATINE) tablet 10 mg  10 mg Oral Q M,W,F Rise Patience, MD   10 mg at 05/12/17 4332  . multivitamin (RENA-VIT) tablet 1 tablet  1 tablet Oral QHS Rise Patience, MD   1 tablet at 05/11/17 2223  . ondansetron (ZOFRAN) tablet 4 mg  4 mg Oral Q6H PRN Rise Patience, MD       Or  . ondansetron [facility]) injection 4 mg  4 mg Intravenous Q6H PRN Rise Patience, MD      . pantoprazole (PROTONIX) EC tablet 40 mg  40 mg Oral BID AC Rise Patience, MD   40 mg at 05/12/17 0940  . PARoxetine (PAXIL) tablet 10 mg  10 mg Oral Daily Rise Patience, MD   10 mg at 05/12/17 0940  . pentafluoroprop-tetrafluoroeth (GEBAUERS) aerosol 1 application  1 application Topical PRN Loren Racer, PA-C      .  polyethylene glycol (MIRALAX / GLYCOLAX) packet 17 g  17 g Oral Daily Rise Patience, MD   17 g at 05/12/17 3794  . simvastatin (ZOCOR) tablet 40 mg  40 mg Oral Daily Rise Patience, MD   40 mg at 05/12/17 3276  . sodium chloride flush (NS) 0.9 % injection 10-40 mL  10-40 mL Intracatheter PRN Cardama, Grayce Sessions, MD   10 mL at 05/10/17 1736  . sucralfate (CARAFATE) 1 GM/10ML suspension 1 g  1 g Oral TID AC & HS Rise Patience, MD   1 g at 05/12/17 0939  . Warfarin - Pharmacist Dosing Inpatient   Does not apply q1800 Doreatha Lew, MD         Discharge Medications: Please see discharge summary for a list of discharge medications.  Relevant Imaging Results:  Relevant Lab Results:   Additional Information SS#: [id number]; Dialysis MWF  Benard Halsted, LCSWA

## 2017-05-12 NOTE — Progress Notes (Signed)
Patient will DC to: [facility] Anticipated DC date: 05/12/17 Family notified: Daughter, [name] Transport by: RN to call [facility] once patient returns from HD (after 5pm [phone]). Please notify patient's daughter, [name], once transport arrives.    Per MD patient ready for DC back to [facility]. RN, patient, patient's family, and facility notified of DC. Discharge Summary sent to facility. RN given number for report [phone]). DC packet on chart.   CSW signing off.  Cedric Fishman, [place] Social Worker [phone]

## 2017-05-12 NOTE — Progress Notes (Signed)
Patient's chart retrieved, Armandina Gemma DNR form and discharge packet complete and in chart. Patient was told we will be calling transportation at 0800 tomorrow morning 11/24. Patient verbalized that she understands and is okay with leaving first thing in the morning.   I spoke with receiving nurse at [facility] and told her the update.

## 2017-05-12 NOTE — Progress Notes (Signed)
CCMD called to d/c telemetry

## 2017-05-12 NOTE — Discharge Instructions (Signed)

## 2017-05-12 NOTE — Progress Notes (Signed)
Called report to [facility]. Patient's admitting diagnosis, treatments and plan of care relayed to receiving nurse, no questions by nurse at this time. . Patient's chest port de accessed via IV team and all night medications per my shift were given.

## 2017-05-12 NOTE — Clinical Social Work Note (Signed)
Clinical Social Work Assessment  Patient Details  Name: Ashlee Miles MRN: [id number] Date of Birth: 06-25-1940  Date of referral:  05/12/17               Reason for consult:  Facility Placement                Permission sought to share information with:  Facility Sport and exercise psychologist, Family Supports Permission granted to share information::  Yes, Verbal Permission Granted  Name::     Diplomatic Services operational officer::  SNF  Relationship::  Daughter  Contact Information:     Housing/Transportation Living arrangements for the past 2 months:  Single Family Home Source of Information:  Patient Patient Interpreter Needed:  None Criminal Activity/Legal Involvement Pertinent to Current Situation/Hospitalization:  No - Comment as needed Significant Relationships:  Adult Children Lives with:  Self, Adult Children Do you feel safe going back to the place where you live?  Yes Need for family participation in patient care:  No (Coment)  Care giving concerns:  CSW received consult regarding discharge planning. Patient reports that she came to the hospital from [facility] and would like to return at discharge. CSW to continue to follow and assist with discharge planning needs.   Social Worker assessment / plan:  CSW spoke with patient concerning possibility of returning to rehab at [facility] before returning home.  Employment status:  Retired Nurse, adult PT Recommendations:  Not assessed at this time Information / Referral to community resources:  [place]  Patient/Family's Response to care:  Patient recognizes need for rehab and wants to return to [facility] by [employer].   Patient/Family's Understanding of and Emotional Response to Diagnosis, Current Treatment, and Prognosis:  Patient/family is realistic regarding therapy needs and expressed being hopeful for SNF placement. Patient expressed understanding of CSW role and discharge process as well as medical condition.  No questions/concerns about plan or treatment.    Emotional Assessment Appearance:  Appears stated age Attitude/Demeanor/Rapport:  Other(Appropriate) Affect (typically observed):  Appropriate Orientation:  Oriented to Self, Oriented to Place, Oriented to  Time, Oriented to Situation Alcohol / Substance use:  Not Applicable Psych involvement (Current and /or in the community):  No (Comment)  Discharge Needs  Concerns to be addressed:  Care Coordination Readmission within the last 30 days:  Yes Current discharge risk:  None Barriers to Discharge:  No Barriers Identified   Benard Halsted, [place] 05/12/2017, 3:29 PM

## 2017-05-12 NOTE — Discharge Summary (Signed)
Physician Discharge Summary  Jaline Pincock  WVP:[id number]  DOB: Jan 08, 1941  DOA: 05/09/2017 PCP: Darlina Rumpf, MD  Admit date: 05/09/2017 Discharge date: 05/12/2017  Admitted From: SNF Disposition:  SNF   Recommendations for Outpatient Follow-up:  1. Follow up with PCP in 1-2 weeks 2. Please obtain BMP/CBC in one week to monitor renal function and Hgb  3. Check INR levels with dialysis on Monday   Discharge Condition: Stable  CODE STATUS: DNR  Diet recommendation: Heart Healthy Neysa Hotter   Brief/Interim Summary: Ashlee Miles is a 76 y.o.femalewithhistory of ESRD on hemodialysis, diabetes mellitus type 2, protein C deficiency and DVT on Coumadin, chronic anemia, history of CHF was recently admitted for abdominal pain likely from gastritis had dialysis on Sunday and had a AV fistula on the left groin was cannulated. Following which patient started to have increasing pain to the point now that patient has severe pain on even minimal movement of the left lower extremity. Also has a ecchymotic area and indurated area in the left groin area. Patient was admitted for further evaluation. US of the lower extremity revealed pseudoaneurysm, vascular surgery was consulted and recommended fixing surgically. Patient underwent on stent placement in the left thigh AV graft w/o complications. Patient had a remaining hematoma which is tender, but stable with no increase in size noted in over 48 hrs. Patient Coumadin was held for procedure and vitamin K was given x 1 Coumadin was started 24 hr post op, no signs of bleeding where noted, patient was dialyzed prior to discharge. Tolerated well procedure. Patient discharged home with outpatient follow up.   Subjective: Patient seen and examined, some pain in the groin but relieved by pain medication. Hematoma stable. No acute events overnight . Remains afebrile    Discharge Diagnoses/Hospital Course:  Left groin pain due to pseudoaneurysm from AV fistula  and Hematoma  S/p covered stent in left thigh AV graft  Apply warm compresses for hematoma - this will take at least 6 week to dissolve Follow up with PCP    History of DVT and protein C deficiency Was given vitamin K pre procedure  Warfarin resumed last night INR 1.52 Dose for tonight 7.5 mg then return to normal dose. Check INR levels on Monday with dialysis    Diabetes mellitus type 2 CBG's stable Continue current regimen   End-stage renal disease on HD MWF  Continue HD as scheduled   All other chronic medical condition were stable during the hospitalization.  On the day of the discharge the patient's vitals were stable, and no other acute medical condition were reported by patient. the patient was felt safe to be discharge to home   Discharge Instructions  You were cared for by a hospitalist during your hospital stay. If you have any questions about your discharge medications or the care you received while you were in the hospital after you are discharged, you can call the unit and asked to speak with the hospitalist on call if the hospitalist that took care of you is not available. Once you are discharged, your primary care physician will handle any further medical issues. Please note that NO REFILLS for any discharge medications will be authorized once you are discharged, as it is imperative that you return to your primary care physician (or establish a relationship with a primary care physician if you do not have one) for your aftercare needs so that they can reassess your need for medications and monitor your lab values.  Discharge Instructions  Call MD for:  difficulty breathing, headache or visual disturbances   Complete by:  As directed    Call MD for:  extreme fatigue   Complete by:  As directed    Call MD for:  hives   Complete by:  As directed    Call MD for:  persistant dizziness or light-headedness   Complete by:  As directed    Call MD for:  persistant nausea  and vomiting   Complete by:  As directed    Call MD for:  redness, tenderness, or signs of infection (pain, swelling, redness, odor or green/yellow discharge around incision site)   Complete by:  As directed    Call MD for:  severe uncontrolled pain   Complete by:  As directed    Call MD for:  temperature >100.4   Complete by:  As directed    Diet - low sodium heart healthy   Complete by:  As directed    Increase activity slowly   Complete by:  As directed      Allergies as of 05/12/2017   No Known Allergies     Medication List    STOP taking these medications   amoxicillin 500 MG tablet Commonly known as:  AMOXIL   clarithromycin 500 MG tablet Commonly known as:  BIAXIN   simvastatin 40 MG tablet Commonly known as:  ZOCOR   triamcinolone cream 0.1 % Commonly known as:  KENALOG     TAKE these medications   acetaminophen 325 MG tablet Commonly known as:  TYLENOL Take 650 mg by mouth every 6 (six) hours as needed for mild pain.   aspirin 325 MG EC tablet Take 1 tablet (325 mg total) by mouth daily for 5 days.   calcitRIOL 0.5 MCG capsule Commonly known as:  ROCALTROL Take 0.5 mcg by mouth daily.   camphor-menthol lotion Commonly known as:  SARNA Apply 1 application topically every 8 (eight) hours as needed for itching.   carvedilol 6.25 MG tablet Commonly known as:  COREG Take 1 tablet (6.25 mg total) by mouth 2 (two) times daily.   docusate sodium 100 MG capsule Commonly known as:  COLACE Take 1 capsule (100 mg total) by mouth 2 (two) times daily.   famotidine 20 MG tablet Commonly known as:  PEPCID Take 1 tablet (20 mg total) daily by mouth.   feeding supplement (NEPRO CARB STEADY) Liqd Take 237 mLs by mouth 3 (three) times daily as needed (Supplement). What changed:  when to take this   feeding supplement (PRO-STAT SUGAR FREE 64) Liqd Take 30 mLs by mouth 2 (two) times daily.   HYDROcodone-acetaminophen 5-325 MG tablet Commonly known as:   NORCO/VICODIN Take 1 tablet 3 (three) times daily as needed by mouth for severe pain. What changed:    when to take this  reasons to take this   hydrOXYzine 25 MG tablet Commonly known as:  ATARAX/VISTARIL Take 1 tablet (25 mg total) by mouth every 8 (eight) hours as needed for itching.   insulin aspart 100 UNIT/ML injection Commonly known as:  novoLOG Inject 0-9 Units into the skin 3 (three) times daily with meals. What changed:  additional instructions   ipratropium-albuterol 0.5-2.5 (3) MG/3ML Soln Commonly known as:  DUONEB Take 3 mLs by nebulization every 4 (four) hours as needed. What changed:  reasons to take this   linaclotide 145 MCG Caps capsule Commonly known as:  LINZESS Take 145 mcg by mouth daily before breakfast.   methocarbamol 500 MG  tablet Commonly known as:  ROBAXIN Take 1 tablet (500 mg total) every 8 (eight) hours as needed by mouth for muscle spasms.   midodrine 10 MG tablet Commonly known as:  PROAMATINE Take 1 tablet (10 mg total) by mouth every Monday, Wednesday, and Friday with hemodialysis. What changed:  when to take this   multivitamin Tabs tablet Take 1 tablet by mouth at bedtime.   pantoprazole 40 MG tablet Commonly known as:  PROTONIX Take 1 tablet (40 mg total) 2 (two) times daily before a meal by mouth.   PARoxetine 10 MG tablet Commonly known as:  PAXIL Take 1 tablet (10 mg total) daily by mouth.   polyethylene glycol packet Commonly known as:  MIRALAX / GLYCOLAX Take 17 g daily by mouth.   sucralfate 1 GM/10ML suspension Commonly known as:  CARAFATE Take 10 mLs (1 g total) 4 (four) times daily -  with meals and at bedtime by mouth. What changed:    when to take this  additional instructions   warfarin 7.5 MG tablet Commonly known as:  COUMADIN Take 1 tablet (7.5 mg total) by mouth daily for 1 dose. What changed:    medication strength  how much to take   warfarin 5 MG tablet Commonly known as:  COUMADIN Take 1  tablet (5 mg total) by mouth daily at 6 PM. Start taking on:  05/13/2017 What changed:  Another medication with the same name was changed. Make sure you understand how and when to take each.      Follow-up Information    Darlina Rumpf, MD. Schedule an appointment as soon as possible for a visit in 1 week(s).   Specialty:  Internal Medicine Why:  Hospital follow up  Contact information: [place] [id number] [phone]          No Known Allergies  Consultations:  Vascular surgery   Nephrology    Procedures/Studies: Ct Abdomen Pelvis Wo Contrast  Result Date: 04/25/2017 CLINICAL DATA:  Acute mid abdominal pain. EXAM: CT ABDOMEN AND PELVIS WITHOUT CONTRAST TECHNIQUE: Multidetector CT imaging of the abdomen and pelvis was performed following the standard protocol without IV contrast. COMPARISON:  None. FINDINGS: Lower chest: No acute abnormality. Hepatobiliary: No focal liver abnormality is seen. Status post cholecystectomy. No biliary dilatation. Pancreas: Unremarkable. No pancreatic ductal dilatation or surrounding inflammatory changes. Spleen: Normal in size without focal abnormality. Adrenals/Urinary Tract: Adrenal glands appear normal. Bilateral renal atrophy is noted consistent with history of end-stage renal disease. No hydronephrosis or renal obstruction is noted. No renal or ureteral calculi are noted. Urinary bladder is unremarkable. Stomach/Bowel: Diffuse wall thickening of the stomach is noted with mild inflammatory changes concerning for gastritis. No bowel dilatation or obstruction is noted. The appendix is not visualized. Vascular/Lymphatic: Aortic atherosclerosis. No enlarged abdominal or pelvic lymph nodes. Reproductive: Status post hysterectomy. No adnexal masses. Other: No abdominal wall hernia or abnormality. No abdominopelvic ascites. Musculoskeletal: Findings consistent with renal osteodystrophy. IMPRESSION: Diffuse gastric wall thickening is  noted with mild surrounding inflammatory changes concerning for gastritis or possibly lymphomatous infiltration. Endoscopy is recommended for further evaluation. Bilateral renal atrophy is noted consistent with end-stage renal disease. Aortic atherosclerosis. Electronically Signed   By: Marijo Conception, M.D.   On: 04/25/2017 14:58   Dg Wrist Complete Left  Result Date: 04/22/2017 CLINICAL DATA:  Fall from commode with left wrist pain. EXAM: LEFT WRIST - COMPLETE 3+ VIEW COMPARISON:  None. FINDINGS: There is no evidence of fracture or dislocation.  Mild radiocarpal osteoarthritis with joint space narrowing in radial styloid spurring. Soft tissues are unremarkable. IMPRESSION: Negative for fracture or subluxation. Electronically Signed   By: Jeb Levering M.D.   On: 04/22/2017 00:49   Dg Tibia/fibula Left  Result Date: 04/22/2017 CLINICAL DATA:  Fall from commode with left lower extremity pain. EXAM: LEFT TIBIA AND FIBULA - 2 VIEW COMPARISON:  None. FINDINGS: There is no evidence of fracture or other focal bone lesions. Degenerative change at the knee. Plantar calcaneal spur are. Mild diffuse soft tissue edema versus habitus. IMPRESSION: No fracture of the left lower leg. Electronically Signed   By: Jeb Levering M.D.   On: 04/22/2017 00:46   Dg Tibia/fibula Right  Result Date: 04/22/2017 CLINICAL DATA:  Fall from commode with right lower extremity pain. EXAM: RIGHT TIBIA AND FIBULA - 2 VIEW COMPARISON:  None. FINDINGS: There is no evidence of fracture or other focal bone lesions. Right knee arthroplasty appears intact. Rounded calcifications in the anterior soft tissues likely phleboliths. Soft tissue edema versus habitus. IMPRESSION: No fracture of the right lower leg.  Intact right knee arthroplasty. Electronically Signed   By: Jeb Levering M.D.   On: 04/22/2017 00:47   Ct Head Wo Contrast  Result Date: 04/25/2017 CLINICAL DATA:  Patient fell off toilet today and has blood in left eye and  knot on the bridge of the nose. EXAM: CT HEAD WITHOUT CONTRAST TECHNIQUE: Contiguous axial images were obtained from the base of the skull through the vertex without intravenous contrast. COMPARISON:  None. FINDINGS: Brain: Chronic mild small vessel ischemic disease. Mild superficial and central atrophy. Idiopathic basal ganglial calcifications are re- demonstrated. No acute intracranial hemorrhage, large vascular territory infarct, edema or midline shift. No effacement of the basal cisterns Vascular: No hyperdense vessels. Moderate atherosclerosis of the carotid siphons. Skull: Negative for acute fracture or focal osseous lesions. Sinuses/Orbits: Intact orbits and globes without retrobulbar hemorrhage. No acute sinus disease. Other: Soft tissue contusion of the nasal bridge. IMPRESSION: 1. Soft tissue contusion over the nasal bridge without nasal bone fracture. 2. Chronic microvascular ischemic disease. No acute intracranial abnormality. 3. Intact orbits and globes without retrobulbar hemorrhage. Electronically Signed   By: Ashley Royalty M.D.   On: 04/25/2017 19:59   Ct Head Wo Contrast  Result Date: 04/21/2017 CLINICAL DATA:  76 year old female with head trauma. EXAM: CT HEAD WITHOUT CONTRAST CT CERVICAL SPINE WITHOUT CONTRAST TECHNIQUE: Multidetector CT imaging of the head and cervical spine was performed following the standard protocol without intravenous contrast. Multiplanar CT image reconstructions of the cervical spine were also generated. COMPARISON:  Head CT dated 03/12/2017 FINDINGS: CT HEAD FINDINGS Brain: The ventricles and sulci appropriate size for patient's age. Minimal periventricular and deep white matter chronic microvascular ischemic changes noted. There is no acute intracranial hemorrhage. No mass effect or midline shift noted. No extra-axial fluid collection. Vascular: No hyperdense vessel or unexpected calcification. Skull:  No acute calvarial pathology. Sinuses/Orbits: The visualized  paranasal sinuses are clear. Mild left mastoid effusion. The right mastoid air cells are clear. Other: None CT CERVICAL SPINE FINDINGS Alignment: No acute subluxation. Skull base and vertebrae: No acute fracture. No primary bone lesion or focal pathologic process. Soft tissues and spinal canal: No prevertebral fluid or swelling. No visible canal hematoma. Disc levels: C4-C5 disc spacer and anterior fusion plate and screws noted. There are multilevel degenerative changes of the spine with osteophytes. Upper chest: Negative. Other: Atherosclerotic calcification of the vessels of the aortic arch. IMPRESSION: 1. No  acute intracranial pathology. 2. No acute/traumatic cervical spine pathology. 3. Degenerative changes of the cervical spine and C4-C5 disc spacer and anterior fusion. Electronically Signed   By: Anner Crete M.D.   On: 04/21/2017 20:41   Ct Cervical Spine Wo Contrast  Result Date: 04/21/2017 CLINICAL DATA:  76 year old female with head trauma. EXAM: CT HEAD WITHOUT CONTRAST CT CERVICAL SPINE WITHOUT CONTRAST TECHNIQUE: Multidetector CT imaging of the head and cervical spine was performed following the standard protocol without intravenous contrast. Multiplanar CT image reconstructions of the cervical spine were also generated. COMPARISON:  Head CT dated 03/12/2017 FINDINGS: CT HEAD FINDINGS Brain: The ventricles and sulci appropriate size for patient's age. Minimal periventricular and deep white matter chronic microvascular ischemic changes noted. There is no acute intracranial hemorrhage. No mass effect or midline shift noted. No extra-axial fluid collection. Vascular: No hyperdense vessel or unexpected calcification. Skull:  No acute calvarial pathology. Sinuses/Orbits: The visualized paranasal sinuses are clear. Mild left mastoid effusion. The right mastoid air cells are clear. Other: None CT CERVICAL SPINE FINDINGS Alignment: No acute subluxation. Skull base and vertebrae: No acute fracture. No  primary bone lesion or focal pathologic process. Soft tissues and spinal canal: No prevertebral fluid or swelling. No visible canal hematoma. Disc levels: C4-C5 disc spacer and anterior fusion plate and screws noted. There are multilevel degenerative changes of the spine with osteophytes. Upper chest: Negative. Other: Atherosclerotic calcification of the vessels of the aortic arch. IMPRESSION: 1. No acute intracranial pathology. 2. No acute/traumatic cervical spine pathology. 3. Degenerative changes of the cervical spine and C4-C5 disc spacer and anterior fusion. Electronically Signed   By: Anner Crete M.D.   On: 04/21/2017 20:41   Mr Brain Wo Contrast  Result Date: 04/27/2017 CLINICAL DATA:  Recurrent syncope EXAM: MRI HEAD WITHOUT CONTRAST MRI CERVICAL SPINE WITHOUT CONTRAST TECHNIQUE: Multiplanar, multiecho pulse sequences of the brain and surrounding structures, and cervical spine, to include the craniocervical junction and cervicothoracic junction, were obtained without intravenous contrast. COMPARISON:  Head CT 04/25/2017 FINDINGS: MRI HEAD FINDINGS Brain: The midline structures are normal. There is no acute infarct or acute hemorrhage. No mass lesion, hydrocephalus, dural abnormality or extra-axial collection. Mild periventricular T2 hyperintensity. No age-advanced or lobar predominant atrophy. No chronic microhemorrhage or superficial siderosis. Vascular: Major intracranial arterial and venous sinus flow voids are preserved. Skull and upper cervical spine: The visualized skull base, calvarium, upper cervical spine and extracranial soft tissues are normal. Sinuses/Orbits: No fluid levels or advanced mucosal thickening. No mastoid or middle ear effusion. Normal orbits. MRI CERVICAL SPINE FINDINGS Alignment: Straightening of the normal cervical lordosis. No static subluxation. Vertebrae: No fracture, evidence of discitis, or bone lesion. There is ACDF at C4-C5. Cord: No focal signal abnormality.  Posterior Fossa, vertebral arteries, paraspinal tissues: Normal vertebral artery flow voids. No prevertebral soft tissue swelling. Disc levels: C1-C2: Normal. C2-C3: Normal disc space and facets. No spinal canal or neuroforaminal stenosis. C3-C4: Large disc osteophyte complex and bilateral uncovertebral hypertrophy severe spinal canal stenosis with flattening of the spinal cord. Moderate right neural foraminal stenosis. C4-C5: Postfusion changes without stenosis. C5-C6: Small disc bulge without stenosis. C6-C7: Small disc bulge without stenosis. C7-T1: Normal disc space and facets. No spinal canal or neuroforaminal stenosis. IMPRESSION: 1. Normal brain MRI for age. 2. 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 3. Moderate right C3-4 neural foraminal stenosis. Electronically Signed   By: Cletus Gash.D.  On: 04/27/2017 02:27   Mr Cervical Spine Wo Contrast  Result Date: 04/27/2017 CLINICAL DATA:  Recurrent syncope EXAM: MRI HEAD WITHOUT CONTRAST MRI CERVICAL SPINE WITHOUT CONTRAST TECHNIQUE: Multiplanar, multiecho pulse sequences of the brain and surrounding structures, and cervical spine, to include the craniocervical junction and cervicothoracic junction, were obtained without intravenous contrast. COMPARISON:  Head CT 04/25/2017 FINDINGS: MRI HEAD FINDINGS Brain: The midline structures are normal. There is no acute infarct or acute hemorrhage. No mass lesion, hydrocephalus, dural abnormality or extra-axial collection. Mild periventricular T2 hyperintensity. No age-advanced or lobar predominant atrophy. No chronic microhemorrhage or superficial siderosis. Vascular: Major intracranial arterial and venous sinus flow voids are preserved. Skull and upper cervical spine: The visualized skull base, calvarium, upper cervical spine and extracranial soft tissues are normal. Sinuses/Orbits: No fluid levels or advanced mucosal  thickening. No mastoid or middle ear effusion. Normal orbits. MRI CERVICAL SPINE FINDINGS Alignment: Straightening of the normal cervical lordosis. No static subluxation. Vertebrae: No fracture, evidence of discitis, or bone lesion. There is ACDF at C4-C5. Cord: No focal signal abnormality. Posterior Fossa, vertebral arteries, paraspinal tissues: Normal vertebral artery flow voids. No prevertebral soft tissue swelling. Disc levels: C1-C2: Normal. C2-C3: Normal disc space and facets. No spinal canal or neuroforaminal stenosis. C3-C4: Large disc osteophyte complex and bilateral uncovertebral hypertrophy severe spinal canal stenosis with flattening of the spinal cord. Moderate right neural foraminal stenosis. C4-C5: Postfusion changes without stenosis. C5-C6: Small disc bulge without stenosis. C6-C7: Small disc bulge without stenosis. C7-T1: Normal disc space and facets. No spinal canal or neuroforaminal stenosis. IMPRESSION: 1. Normal brain MRI for age. 2. 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 3. Moderate right C3-4 neural foraminal stenosis. Electronically Signed   By: Ulyses Jarred M.D.   On: 04/27/2017 02:27   Dg Chest Port 1 View  Result Date: 04/25/2017 CLINICAL DATA:  Onset of mid abdominal pain. Fell from a commode 3 days ago. History of dialysis dependent renal failure, chronic CHF, diabetes. EXAM: PORTABLE CHEST 1 VIEW COMPARISON:  Chest x-ray of April 21, 2017 FINDINGS: The lungs are adequately inflated. There is no focal infiltrate. There is no pleural effusion. The cardiac silhouette is enlarged. The pulmonary vascularity is normal. There is calcification in the wall of the aortic arch. There is a catheter present via the right subclavian approach whose tip projects over the midportion of the SVC. The bony thorax is unremarkable. IMPRESSION: Stable cardiomegaly without pulmonary vascular congestion or pulmonary  edema. No acute pneumonia nor other acute cardiopulmonary abnormality. Thoracic aortic atherosclerosis. Electronically Signed   By: David  Martinique M.D.   On: 04/25/2017 14:12   Dg Chest Portable 1 View  Result Date: 04/21/2017 CLINICAL DATA:  Patient fell off commode striking right shoulder nose. Right shoulder pain. EXAM: PORTABLE CHEST 1 VIEW COMPARISON:  None. FINDINGS: The cardiopericardial silhouette is enlarged with moderate aortic atherosclerosis at the arch. No acute pulmonary consolidation, effusion or pulmonary edema. Port catheter is noted with tip in the distal SVC. Vascular stent projects over the left upper arm. ACDF of the included lower cervical spine. Subtle lucency of the distal right clavicle without displacement the suspicious for nondisplaced fracture. IMPRESSION: 1. Subtle lucency of the distal right clavicle without displacement. Findings are suspicious for nondisplaced fracture. 2. Cardiomegaly with aortic atherosclerosis. No active pulmonary disease. Electronically Signed   By: Ashley Royalty M.D.   On: 04/21/2017 20:18   Dg Shoulder Right  Portable  Result Date: 04/21/2017 CLINICAL DATA:  Right shoulder pain after fall. EXAM: PORTABLE RIGHT SHOULDER COMPARISON:  None. FINDINGS: Subtle lucency of the distal right clavicle without displacement is noted similar to same day chest radiograph findings. Findings are suspicious for nondisplaced distal clavicular fracture without intra-articular involvement. AC glenohumeral joints are maintained. Port catheter is noted along the upper right chest wall with tip in the distal SVC. ACDF of the lower cervical spine. Axillary clips are seen on the right. No adjacent rib fracture. The included right lung is unremarkable. IMPRESSION: 1. Subtle lucency is noted involving the distal right clavicle suspicious for a nondisplaced fracture. This lucency is also seen on the same day chest radiograph. 2. Intact AC and glenohumeral joints. Electronically Signed    By: Ashley Royalty M.D.   On: 04/21/2017 20:21   Dg Ang/ext/uni/or Left  Result Date: 05/12/2017 CLINICAL DATA:  76 year old with a left thigh dialysis graft pseudoaneurysm and hematoma. EXAM: INTRAOPERATIVE ANGIOGRAPHY TECHNIQUE: Cholangiographic images from the C-arm fluoroscopic device were submitted for interpretation post-operatively. Please see the procedural report for the amount of contrast and the fluoroscopy time utilized. COMPARISON:  None. FINDINGS: Intraoperative images of a left thigh shuntogram. Initial images demonstrate the pseudoaneurysm originating from the medial aspect of the graft. Subsequently, a covered stent was placed across the pseudoaneurysm. Final images demonstrate markedly decreased filling of the pseudoaneurysm at the end of the procedure. Outflow vein is patent. IMPRESSION: Treatment of the dialysis graft pseudoaneurysm with a covered stent. Electronically Signed   By: Markus Daft M.D.   On: 05/12/2017 08:13    Discharge Exam: Vitals:   05/12/17 0630 05/12/17 0845  BP: 130/70 132/74  Pulse: 69 68  Resp: 18 18  Temp: 98.5 F (36.9 C) 98.2 F (36.8 C)  SpO2: 100% 100%   Vitals:   05/11/17 1755 05/11/17 2058 05/12/17 0630 05/12/17 0845  BP: (!) 149/75 (!) 141/39 130/70 132/74  Pulse: 67 70 69 68  Resp: 18 17 18 18   Temp: 98.4 F (36.9 C) 98.1 F (36.7 C) 98.5 F (36.9 C) 98.2 F (36.8 C)  TempSrc: Oral Oral Oral Oral  SpO2: 98% 100% 100% 100%  Weight:  98.7 kg (217 lb 10.3 oz)    Height:        General: NAD  Cardiovascular: RRR, S1/S2 +, no rubs, no gallops Respiratory: CTA bilaterally, no wheezing, no rhonchi Abdominal: Soft, NT, ND, bowel sounds + Extremities: Left groin with large indurated hematoma, mild tenderness to palpation   The results of significant diagnostics from this hospitalization (including imaging, microbiology, ancillary and laboratory) are listed below for reference.     Microbiology: Recent Results (from the past 240  hour(s))  Surgical PCR screen     Status: None   Collection Time: 05/10/17  5:03 PM  Result Value Ref Range Status   MRSA, PCR NEGATIVE NEGATIVE Final   Staphylococcus aureus NEGATIVE NEGATIVE Final    Comment: (NOTE) The Xpert SA Assay (FDA approved for NASAL specimens in patients 71 years of age and older), is one component of a comprehensive surveillance program. It is not intended to diagnose infection nor to guide or monitor treatment.      Labs: BNP (last 3 results) Recent Labs    04/05/17 1947  BNP 462.7*   Basic Metabolic Panel: Recent Labs  Lab 05/09/17 1820 05/10/17 0554 05/11/17 0410  NA 130* 133* 135  K 4.1 4.0 4.4  CL 94* 96* 100*  CO2 25 27 25  GLUCOSE 344* 251* 243*  BUN 44* 47* 50*  CREATININE 5.53* 5.65* 5.47*  CALCIUM 8.3* 8.2* 8.0*   Liver Function Tests: Recent Labs  Lab 05/09/17 1820  AST 17  ALT 9*  ALKPHOS 77  BILITOT 0.5  PROT 6.3*  ALBUMIN 3.1*   No results for input(s): LIPASE, AMYLASE in the last 168 hours. No results for input(s): AMMONIA in the last 168 hours. CBC: Recent Labs  Lab 05/09/17 1820 05/10/17 0133 05/10/17 0554 05/10/17 1024 05/11/17 0410 05/12/17 1318  WBC 6.4 5.3 5.1 4.7 5.4 4.7  NEUTROABS 3.6  --   --   --  2.7  --   HGB 9.2* 8.8* 8.5* 8.9* 8.8* 9.1*  HCT 28.6* 27.0* 26.3* 27.7* 27.4* 28.4*  MCV 77.5* 76.7* 77.8* 78.0 77.6* 77.6*  PLT 311 288 273 290 292 250   Cardiac Enzymes: No results for input(s): CKTOTAL, CKMB, CKMBINDEX, TROPONINI in the last 168 hours. BNP: Invalid input(s): POCBNP CBG: Recent Labs  Lab 05/11/17 1244 05/11/17 1703 05/11/17 2053 05/12/17 0800 05/12/17 1134  GLUCAP 298* 187* 164* 262* 275*   D-Dimer No results for input(s): DDIMER in the last 72 hours. Hgb A1c No results for input(s): HGBA1C in the last 72 hours. Lipid Profile No results for input(s): CHOL, HDL, LDLCALC, TRIG, CHOLHDL, LDLDIRECT in the last 72 hours. Thyroid function studies No results for input(s):  TSH, T4TOTAL, T3FREE, THYROIDAB in the last 72 hours.  Invalid input(s): FREET3 Anemia work up No results for input(s): VITAMINB12, FOLATE, FERRITIN, TIBC, IRON, RETICCTPCT in the last 72 hours. Urinalysis    Component Value Date/Time   COLORURINE YELLOW 04/25/2017 1940   APPEARANCEUR CLEAR 04/25/2017 1940   LABSPEC 1.013 04/25/2017 1940   PHURINE 8.0 04/25/2017 1940   GLUCOSEU NEGATIVE 04/25/2017 1940   HGBUR NEGATIVE 04/25/2017 [place] NEGATIVE 04/25/2017 1940   KETONESUR 20 (A) 04/25/2017 1940   PROTEINUR 100 (A) 04/25/2017 1940   NITRITE NEGATIVE 04/25/2017 1940   LEUKOCYTESUR TRACE (A) 04/25/2017 1940   Sepsis Labs Invalid input(s): PROCALCITONIN,  WBC,  LACTICIDVEN Microbiology Recent Results (from the past 240 hour(s))  Surgical PCR screen     Status: None   Collection Time: 05/10/17  5:03 PM  Result Value Ref Range Status   MRSA, PCR NEGATIVE NEGATIVE Final   Staphylococcus aureus NEGATIVE NEGATIVE Final    Comment: (NOTE) The Xpert SA Assay (FDA approved for NASAL specimens in patients 98 years of age and older), is one component of a comprehensive surveillance program. It is not intended to diagnose infection nor to guide or monitor treatment.     Time coordinating discharge: 35 minutes  SIGNED:  Chipper Oman, MD  [facility] 05/12/2017, 1:48 PM  Pager please text page via  [email or web address]

## 2017-05-12 NOTE — Procedures (Signed)
Patient was seen on dialysis and the procedure was supervised.  BFR 400  Via AVG BP is  146/60 .   Patient appears to be tolerating treatment well  Dana Dorner A 05/12/2017

## 2017-05-12 NOTE — Progress Notes (Signed)
Pt. Being discharged to skilled nursing facility. Port a cath flushed with 16ml NS and good blood return noted. 500 units of heparin then instilled to port a cath prior to deaccess. No bleeding noted. Guaze and tape applied to site.  IV Team, Renaldo Fiddler RN, BSN

## 2017-05-12 NOTE — Progress Notes (Signed)
Physician Discharge Summary  Ashlee Miles  TMH:[id number]  DOB: Oct 14, 1940  DOA: 05/09/2017 PCP: Darlina Rumpf, MD  Admit date: 05/09/2017 Discharge date: 05/12/2017  Admitted From: SNF Disposition:  SNF   Recommendations for Outpatient Follow-up:  1. Follow up with PCP in 1-2 weeks 2. Please obtain BMP/CBC in one week to monitor renal function and Hgb  3. Check INR levels with dialysis on Monday   Discharge Condition: Stable  CODE STATUS: DNR  Diet recommendation: Heart Healthy Neysa Hotter   Brief/Interim Summary: Ashlee Miles is a 76 y.o.femalewithhistory of ESRD on hemodialysis, diabetes mellitus type 2, protein C deficiency and DVT on Coumadin, chronic anemia, history of CHF was recently admitted for abdominal pain likely from gastritis had dialysis on Sunday and had a AV fistula on the left groin was cannulated. Following which patient started to have increasing pain to the point now that patient has severe pain on even minimal movement of the left lower extremity. Also has a ecchymotic area and indurated area in the left groin area. Patient was admitted for further evaluation. US of the lower extremity revealed pseudoaneurysm, vascular surgery was consulted and recommended fixing surgically. Patient underwent on stent placement in the left thigh AV graft w/o complications. Patient had a remaining hematoma which is tender, but stable with no increase in size noted in over 48 hrs. Patient Coumadin was held for procedure and vitamin K was given x 1 Coumadin was started 24 hr post op, no signs of bleeding where noted, patient was dialyzed prior to discharge. Tolerated well procedure. Patient discharged home with outpatient follow up.   Subjective: Patient seen and examined, some pain in the groin but relieved by pain medication. Hematoma stable. No acute events overnight . Remains afebrile    Discharge Diagnoses/Hospital Course:  Left groin pain due to pseudoaneurysm from AV fistula  and Hematoma  S/p covered stent in left thigh AV graft  Apply warm compresses for hematoma - this will take at least 6 week to dissolve Follow up with PCP    History of DVT and protein C deficiency Was given vitamin K pre procedure  Warfarin resumed last night INR 1.52 Dose for tonight 7.5 mg then return to normal dose. Check INR levels on Monday with dialysis    Diabetes mellitus type 2 CBG's stable Continue current regimen   End-stage renal disease on HD MWF  Continue HD as scheduled   All other chronic medical condition were stable during the hospitalization.  On the day of the discharge the patient's vitals were stable, and no other acute medical condition were reported by patient. the patient was felt safe to be discharge to home   Discharge Instructions  You were cared for by a hospitalist during your hospital stay. If you have any questions about your discharge medications or the care you received while you were in the hospital after you are discharged, you can call the unit and asked to speak with the hospitalist on call if the hospitalist that took care of you is not available. Once you are discharged, your primary care physician will handle any further medical issues. Please note that NO REFILLS for any discharge medications will be authorized once you are discharged, as it is imperative that you return to your primary care physician (or establish a relationship with a primary care physician if you do not have one) for your aftercare needs so that they can reassess your need for medications and monitor your lab values.  Discharge Instructions  Call MD for:  difficulty breathing, headache or visual disturbances   Complete by:  As directed    Call MD for:  extreme fatigue   Complete by:  As directed    Call MD for:  hives   Complete by:  As directed    Call MD for:  persistant dizziness or light-headedness   Complete by:  As directed    Call MD for:  persistant nausea  and vomiting   Complete by:  As directed    Call MD for:  redness, tenderness, or signs of infection (pain, swelling, redness, odor or green/yellow discharge around incision site)   Complete by:  As directed    Call MD for:  severe uncontrolled pain   Complete by:  As directed    Call MD for:  temperature >100.4   Complete by:  As directed    Diet - low sodium heart healthy   Complete by:  As directed    Increase activity slowly   Complete by:  As directed      Allergies as of 05/12/2017   No Known Allergies     Medication List    STOP taking these medications   amoxicillin 500 MG tablet Commonly known as:  AMOXIL   clarithromycin 500 MG tablet Commonly known as:  BIAXIN   simvastatin 40 MG tablet Commonly known as:  ZOCOR   triamcinolone cream 0.1 % Commonly known as:  KENALOG     TAKE these medications   acetaminophen 325 MG tablet Commonly known as:  TYLENOL Take 650 mg by mouth every 6 (six) hours as needed for mild pain.   aspirin 325 MG EC tablet Take 1 tablet (325 mg total) by mouth daily for 5 days.   calcitRIOL 0.5 MCG capsule Commonly known as:  ROCALTROL Take 0.5 mcg by mouth daily.   camphor-menthol lotion Commonly known as:  SARNA Apply 1 application topically every 8 (eight) hours as needed for itching.   carvedilol 6.25 MG tablet Commonly known as:  COREG Take 1 tablet (6.25 mg total) by mouth 2 (two) times daily.   docusate sodium 100 MG capsule Commonly known as:  COLACE Take 1 capsule (100 mg total) by mouth 2 (two) times daily.   famotidine 20 MG tablet Commonly known as:  PEPCID Take 1 tablet (20 mg total) daily by mouth.   feeding supplement (NEPRO CARB STEADY) Liqd Take 237 mLs by mouth 3 (three) times daily as needed (Supplement). What changed:  when to take this   feeding supplement (PRO-STAT SUGAR FREE 64) Liqd Take 30 mLs by mouth 2 (two) times daily.   HYDROcodone-acetaminophen 5-325 MG tablet Commonly known as:   NORCO/VICODIN Take 1 tablet 3 (three) times daily as needed by mouth for severe pain. What changed:    when to take this  reasons to take this   hydrOXYzine 25 MG tablet Commonly known as:  ATARAX/VISTARIL Take 1 tablet (25 mg total) by mouth every 8 (eight) hours as needed for itching.   insulin aspart 100 UNIT/ML injection Commonly known as:  novoLOG Inject 0-9 Units into the skin 3 (three) times daily with meals. What changed:  additional instructions   ipratropium-albuterol 0.5-2.5 (3) MG/3ML Soln Commonly known as:  DUONEB Take 3 mLs by nebulization every 4 (four) hours as needed. What changed:  reasons to take this   linaclotide 145 MCG Caps capsule Commonly known as:  LINZESS Take 145 mcg by mouth daily before breakfast.   methocarbamol 500 MG  tablet Commonly known as:  ROBAXIN Take 1 tablet (500 mg total) every 8 (eight) hours as needed by mouth for muscle spasms.   midodrine 10 MG tablet Commonly known as:  PROAMATINE Take 1 tablet (10 mg total) by mouth every Monday, Wednesday, and Friday with hemodialysis. What changed:  when to take this   multivitamin Tabs tablet Take 1 tablet by mouth at bedtime.   pantoprazole 40 MG tablet Commonly known as:  PROTONIX Take 1 tablet (40 mg total) 2 (two) times daily before a meal by mouth.   PARoxetine 10 MG tablet Commonly known as:  PAXIL Take 1 tablet (10 mg total) daily by mouth.   polyethylene glycol packet Commonly known as:  MIRALAX / GLYCOLAX Take 17 g daily by mouth.   sucralfate 1 GM/10ML suspension Commonly known as:  CARAFATE Take 10 mLs (1 g total) 4 (four) times daily -  with meals and at bedtime by mouth. What changed:    when to take this  additional instructions   warfarin 7.5 MG tablet Commonly known as:  COUMADIN Take 1 tablet (7.5 mg total) by mouth daily for 1 dose. What changed:    medication strength  how much to take   warfarin 5 MG tablet Commonly known as:  COUMADIN Take 1  tablet (5 mg total) by mouth daily at 6 PM. Start taking on:  05/13/2017 What changed:  Another medication with the same name was changed. Make sure you understand how and when to take each.      Follow-up Information    Darlina Rumpf, MD. Schedule an appointment as soon as possible for a visit in 1 week(s).   Specialty:  Internal Medicine Why:  Hospital follow up  Contact information: [place] [id number] [phone]          No Known Allergies  Consultations:  Vascular surgery   Nephrology    Procedures/Studies: Ct Abdomen Pelvis Wo Contrast  Result Date: 04/25/2017 CLINICAL DATA:  Acute mid abdominal pain. EXAM: CT ABDOMEN AND PELVIS WITHOUT CONTRAST TECHNIQUE: Multidetector CT imaging of the abdomen and pelvis was performed following the standard protocol without IV contrast. COMPARISON:  None. FINDINGS: Lower chest: No acute abnormality. Hepatobiliary: No focal liver abnormality is seen. Status post cholecystectomy. No biliary dilatation. Pancreas: Unremarkable. No pancreatic ductal dilatation or surrounding inflammatory changes. Spleen: Normal in size without focal abnormality. Adrenals/Urinary Tract: Adrenal glands appear normal. Bilateral renal atrophy is noted consistent with history of end-stage renal disease. No hydronephrosis or renal obstruction is noted. No renal or ureteral calculi are noted. Urinary bladder is unremarkable. Stomach/Bowel: Diffuse wall thickening of the stomach is noted with mild inflammatory changes concerning for gastritis. No bowel dilatation or obstruction is noted. The appendix is not visualized. Vascular/Lymphatic: Aortic atherosclerosis. No enlarged abdominal or pelvic lymph nodes. Reproductive: Status post hysterectomy. No adnexal masses. Other: No abdominal wall hernia or abnormality. No abdominopelvic ascites. Musculoskeletal: Findings consistent with renal osteodystrophy. IMPRESSION: Diffuse gastric wall thickening is  noted with mild surrounding inflammatory changes concerning for gastritis or possibly lymphomatous infiltration. Endoscopy is recommended for further evaluation. Bilateral renal atrophy is noted consistent with end-stage renal disease. Aortic atherosclerosis. Electronically Signed   By: Marijo Conception, M.D.   On: 04/25/2017 14:58   Dg Wrist Complete Left  Result Date: 04/22/2017 CLINICAL DATA:  Fall from commode with left wrist pain. EXAM: LEFT WRIST - COMPLETE 3+ VIEW COMPARISON:  None. FINDINGS: There is no evidence of fracture or dislocation.  Mild radiocarpal osteoarthritis with joint space narrowing in radial styloid spurring. Soft tissues are unremarkable. IMPRESSION: Negative for fracture or subluxation. Electronically Signed   By: Jeb Levering M.D.   On: 04/22/2017 00:49   Dg Tibia/fibula Left  Result Date: 04/22/2017 CLINICAL DATA:  Fall from commode with left lower extremity pain. EXAM: LEFT TIBIA AND FIBULA - 2 VIEW COMPARISON:  None. FINDINGS: There is no evidence of fracture or other focal bone lesions. Degenerative change at the knee. Plantar calcaneal spur are. Mild diffuse soft tissue edema versus habitus. IMPRESSION: No fracture of the left lower leg. Electronically Signed   By: Jeb Levering M.D.   On: 04/22/2017 00:46   Dg Tibia/fibula Right  Result Date: 04/22/2017 CLINICAL DATA:  Fall from commode with right lower extremity pain. EXAM: RIGHT TIBIA AND FIBULA - 2 VIEW COMPARISON:  None. FINDINGS: There is no evidence of fracture or other focal bone lesions. Right knee arthroplasty appears intact. Rounded calcifications in the anterior soft tissues likely phleboliths. Soft tissue edema versus habitus. IMPRESSION: No fracture of the right lower leg.  Intact right knee arthroplasty. Electronically Signed   By: Jeb Levering M.D.   On: 04/22/2017 00:47   Ct Head Wo Contrast  Result Date: 04/25/2017 CLINICAL DATA:  Patient fell off toilet today and has blood in left eye and  knot on the bridge of the nose. EXAM: CT HEAD WITHOUT CONTRAST TECHNIQUE: Contiguous axial images were obtained from the base of the skull through the vertex without intravenous contrast. COMPARISON:  None. FINDINGS: Brain: Chronic mild small vessel ischemic disease. Mild superficial and central atrophy. Idiopathic basal ganglial calcifications are re- demonstrated. No acute intracranial hemorrhage, large vascular territory infarct, edema or midline shift. No effacement of the basal cisterns Vascular: No hyperdense vessels. Moderate atherosclerosis of the carotid siphons. Skull: Negative for acute fracture or focal osseous lesions. Sinuses/Orbits: Intact orbits and globes without retrobulbar hemorrhage. No acute sinus disease. Other: Soft tissue contusion of the nasal bridge. IMPRESSION: 1. Soft tissue contusion over the nasal bridge without nasal bone fracture. 2. Chronic microvascular ischemic disease. No acute intracranial abnormality. 3. Intact orbits and globes without retrobulbar hemorrhage. Electronically Signed   By: [place] Royalty M.D.   On: 04/25/2017 19:59   Ct Head Wo Contrast  Result Date: 04/21/2017 CLINICAL DATA:  76 year old female with head trauma. EXAM: CT HEAD WITHOUT CONTRAST CT CERVICAL SPINE WITHOUT CONTRAST TECHNIQUE: Multidetector CT imaging of the head and cervical spine was performed following the standard protocol without intravenous contrast. Multiplanar CT image reconstructions of the cervical spine were also generated. COMPARISON:  Head CT dated 03/12/2017 FINDINGS: CT HEAD FINDINGS Brain: The ventricles and sulci appropriate size for patient's age. Minimal periventricular and deep white matter chronic microvascular ischemic changes noted. There is no acute intracranial hemorrhage. No mass effect or midline shift noted. No extra-axial fluid collection. Vascular: No hyperdense vessel or unexpected calcification. Skull:  No acute calvarial pathology. Sinuses/Orbits: The visualized  paranasal sinuses are clear. Mild left mastoid effusion. The right mastoid air cells are clear. Other: None CT CERVICAL SPINE FINDINGS Alignment: No acute subluxation. Skull base and vertebrae: No acute fracture. No primary bone lesion or focal pathologic process. Soft tissues and spinal canal: No prevertebral fluid or swelling. No visible canal hematoma. Disc levels: C4-C5 disc spacer and anterior fusion plate and screws noted. There are multilevel degenerative changes of the spine with osteophytes. Upper chest: Negative. Other: Atherosclerotic calcification of the vessels of the aortic arch. IMPRESSION: 1. No  acute intracranial pathology. 2. No acute/traumatic cervical spine pathology. 3. Degenerative changes of the cervical spine and C4-C5 disc spacer and anterior fusion. Electronically Signed   By: Anner Crete M.D.   On: 04/21/2017 20:41   Ct Cervical Spine Wo Contrast  Result Date: 04/21/2017 CLINICAL DATA:  76 year old female with head trauma. EXAM: CT HEAD WITHOUT CONTRAST CT CERVICAL SPINE WITHOUT CONTRAST TECHNIQUE: Multidetector CT imaging of the head and cervical spine was performed following the standard protocol without intravenous contrast. Multiplanar CT image reconstructions of the cervical spine were also generated. COMPARISON:  Head CT dated 03/12/2017 FINDINGS: CT HEAD FINDINGS Brain: The ventricles and sulci appropriate size for patient's age. Minimal periventricular and deep white matter chronic microvascular ischemic changes noted. There is no acute intracranial hemorrhage. No mass effect or midline shift noted. No extra-axial fluid collection. Vascular: No hyperdense vessel or unexpected calcification. Skull:  No acute calvarial pathology. Sinuses/Orbits: The visualized paranasal sinuses are clear. Mild left mastoid effusion. The right mastoid air cells are clear. Other: None CT CERVICAL SPINE FINDINGS Alignment: No acute subluxation. Skull base and vertebrae: No acute fracture. No  primary bone lesion or focal pathologic process. Soft tissues and spinal canal: No prevertebral fluid or swelling. No visible canal hematoma. Disc levels: C4-C5 disc spacer and anterior fusion plate and screws noted. There are multilevel degenerative changes of the spine with osteophytes. Upper chest: Negative. Other: Atherosclerotic calcification of the vessels of the aortic arch. IMPRESSION: 1. No acute intracranial pathology. 2. No acute/traumatic cervical spine pathology. 3. Degenerative changes of the cervical spine and C4-C5 disc spacer and anterior fusion. Electronically Signed   By: Anner Crete M.D.   On: 04/21/2017 20:41   Mr Brain Wo Contrast  Result Date: 04/27/2017 CLINICAL DATA:  Recurrent syncope EXAM: MRI HEAD WITHOUT CONTRAST MRI CERVICAL SPINE WITHOUT CONTRAST TECHNIQUE: Multiplanar, multiecho pulse sequences of the brain and surrounding structures, and cervical spine, to include the craniocervical junction and cervicothoracic junction, were obtained without intravenous contrast. COMPARISON:  Head CT 04/25/2017 FINDINGS: MRI HEAD FINDINGS Brain: The midline structures are normal. There is no acute infarct or acute hemorrhage. No mass lesion, hydrocephalus, dural abnormality or extra-axial collection. Mild periventricular T2 hyperintensity. No age-advanced or lobar predominant atrophy. No chronic microhemorrhage or superficial siderosis. Vascular: Major intracranial arterial and venous sinus flow voids are preserved. Skull and upper cervical spine: The visualized skull base, calvarium, upper cervical spine and extracranial soft tissues are normal. Sinuses/Orbits: No fluid levels or advanced mucosal thickening. No mastoid or middle ear effusion. Normal orbits. MRI CERVICAL SPINE FINDINGS Alignment: Straightening of the normal cervical lordosis. No static subluxation. Vertebrae: No fracture, evidence of discitis, or bone lesion. There is ACDF at C4-C5. Cord: No focal signal abnormality.  Posterior Fossa, vertebral arteries, paraspinal tissues: Normal vertebral artery flow voids. No prevertebral soft tissue swelling. Disc levels: C1-C2: Normal. C2-C3: Normal disc space and facets. No spinal canal or neuroforaminal stenosis. C3-C4: Large disc osteophyte complex and bilateral uncovertebral hypertrophy severe spinal canal stenosis with flattening of the spinal cord. Moderate right neural foraminal stenosis. C4-C5: Postfusion changes without stenosis. C5-C6: Small disc bulge without stenosis. C6-C7: Small disc bulge without stenosis. C7-T1: Normal disc space and facets. No spinal canal or neuroforaminal stenosis. IMPRESSION: 1. Normal brain MRI for age. 2. 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 3. Moderate right C3-4 neural foraminal stenosis. Electronically Signed   By: Cletus Gash.D.  On: 04/27/2017 02:27   Mr Cervical Spine Wo Contrast  Result Date: 04/27/2017 CLINICAL DATA:  Recurrent syncope EXAM: MRI HEAD WITHOUT CONTRAST MRI CERVICAL SPINE WITHOUT CONTRAST TECHNIQUE: Multiplanar, multiecho pulse sequences of the brain and surrounding structures, and cervical spine, to include the craniocervical junction and cervicothoracic junction, were obtained without intravenous contrast. COMPARISON:  Head CT 04/25/2017 FINDINGS: MRI HEAD FINDINGS Brain: The midline structures are normal. There is no acute infarct or acute hemorrhage. No mass lesion, hydrocephalus, dural abnormality or extra-axial collection. Mild periventricular T2 hyperintensity. No age-advanced or lobar predominant atrophy. No chronic microhemorrhage or superficial siderosis. Vascular: Major intracranial arterial and venous sinus flow voids are preserved. Skull and upper cervical spine: The visualized skull base, calvarium, upper cervical spine and extracranial soft tissues are normal. Sinuses/Orbits: No fluid levels or advanced mucosal  thickening. No mastoid or middle ear effusion. Normal orbits. MRI CERVICAL SPINE FINDINGS Alignment: Straightening of the normal cervical lordosis. No static subluxation. Vertebrae: No fracture, evidence of discitis, or bone lesion. There is ACDF at C4-C5. Cord: No focal signal abnormality. Posterior Fossa, vertebral arteries, paraspinal tissues: Normal vertebral artery flow voids. No prevertebral soft tissue swelling. Disc levels: C1-C2: Normal. C2-C3: Normal disc space and facets. No spinal canal or neuroforaminal stenosis. C3-C4: Large disc osteophyte complex and bilateral uncovertebral hypertrophy severe spinal canal stenosis with flattening of the spinal cord. Moderate right neural foraminal stenosis. C4-C5: Postfusion changes without stenosis. C5-C6: Small disc bulge without stenosis. C6-C7: Small disc bulge without stenosis. C7-T1: Normal disc space and facets. No spinal canal or neuroforaminal stenosis. IMPRESSION: 1. Normal brain MRI for age. 2. 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 3. Moderate right C3-4 neural foraminal stenosis. Electronically Signed   By: Ulyses Jarred M.D.   On: 04/27/2017 02:27   Dg Chest Port 1 View  Result Date: 04/25/2017 CLINICAL DATA:  Onset of mid abdominal pain. Fell from a commode 3 days ago. History of dialysis dependent renal failure, chronic CHF, diabetes. EXAM: PORTABLE CHEST 1 VIEW COMPARISON:  Chest x-ray of April 21, 2017 FINDINGS: The lungs are adequately inflated. There is no focal infiltrate. There is no pleural effusion. The cardiac silhouette is enlarged. The pulmonary vascularity is normal. There is calcification in the wall of the aortic arch. There is a catheter present via the right subclavian approach whose tip projects over the midportion of the SVC. The bony thorax is unremarkable. IMPRESSION: Stable cardiomegaly without pulmonary vascular congestion or pulmonary  edema. No acute pneumonia nor other acute cardiopulmonary abnormality. Thoracic aortic atherosclerosis. Electronically Signed   By: David  Martinique M.D.   On: 04/25/2017 14:12   Dg Chest Portable 1 View  Result Date: 04/21/2017 CLINICAL DATA:  Patient fell off commode striking right shoulder nose. Right shoulder pain. EXAM: PORTABLE CHEST 1 VIEW COMPARISON:  None. FINDINGS: The cardiopericardial silhouette is enlarged with moderate aortic atherosclerosis at the arch. No acute pulmonary consolidation, effusion or pulmonary edema. Port catheter is noted with tip in the distal SVC. Vascular stent projects over the left upper arm. ACDF of the included lower cervical spine. Subtle lucency of the distal right clavicle without displacement the suspicious for nondisplaced fracture. IMPRESSION: 1. Subtle lucency of the distal right clavicle without displacement. Findings are suspicious for nondisplaced fracture. 2. Cardiomegaly with aortic atherosclerosis. No active pulmonary disease. Electronically Signed   By: [place] Royalty M.D.   On: 04/21/2017 20:18   Dg Shoulder Right  Portable  Result Date: 04/21/2017 CLINICAL DATA:  Right shoulder pain after fall. EXAM: PORTABLE RIGHT SHOULDER COMPARISON:  None. FINDINGS: Subtle lucency of the distal right clavicle without displacement is noted similar to same day chest radiograph findings. Findings are suspicious for nondisplaced distal clavicular fracture without intra-articular involvement. AC glenohumeral joints are maintained. Port catheter is noted along the upper right chest wall with tip in the distal SVC. ACDF of the lower cervical spine. Axillary clips are seen on the right. No adjacent rib fracture. The included right lung is unremarkable. IMPRESSION: 1. Subtle lucency is noted involving the distal right clavicle suspicious for a nondisplaced fracture. This lucency is also seen on the same day chest radiograph. 2. Intact AC and glenohumeral joints. Electronically Signed    By: [place] Royalty M.D.   On: 04/21/2017 20:21   Dg Ang/ext/uni/or Left  Result Date: 05/12/2017 CLINICAL DATA:  76 year old with a left thigh dialysis graft pseudoaneurysm and hematoma. EXAM: INTRAOPERATIVE ANGIOGRAPHY TECHNIQUE: Cholangiographic images from the C-arm fluoroscopic device were submitted for interpretation post-operatively. Please see the procedural report for the amount of contrast and the fluoroscopy time utilized. COMPARISON:  None. FINDINGS: Intraoperative images of a left thigh shuntogram. Initial images demonstrate the pseudoaneurysm originating from the medial aspect of the graft. Subsequently, a covered stent was placed across the pseudoaneurysm. Final images demonstrate markedly decreased filling of the pseudoaneurysm at the end of the procedure. Outflow vein is patent. IMPRESSION: Treatment of the dialysis graft pseudoaneurysm with a covered stent. Electronically Signed   By: Markus Daft M.D.   On: 05/12/2017 08:13    Discharge Exam: Vitals:   05/12/17 0630 05/12/17 0845  BP: 130/70 132/74  Pulse: 69 68  Resp: 18 18  Temp: 98.5 F (36.9 C) 98.2 F (36.8 C)  SpO2: 100% 100%   Vitals:   05/11/17 1755 05/11/17 2058 05/12/17 0630 05/12/17 0845  BP: (!) 149/75 (!) 141/39 130/70 132/74  Pulse: 67 70 69 68  Resp: 18 17 18 18   Temp: 98.4 F (36.9 C) 98.1 F (36.7 C) 98.5 F (36.9 C) 98.2 F (36.8 C)  TempSrc: Oral Oral Oral Oral  SpO2: 98% 100% 100% 100%  Weight:  98.7 kg (217 lb 10.3 oz)    Height:        General: NAD  Cardiovascular: RRR, S1/S2 +, no rubs, no gallops Respiratory: CTA bilaterally, no wheezing, no rhonchi Abdominal: Soft, NT, ND, bowel sounds + Extremities: Left groin with large indurated hematoma, mild tenderness to palpation   The results of significant diagnostics from this hospitalization (including imaging, microbiology, ancillary and laboratory) are listed below for reference.     Microbiology: Recent Results (from the past 240  hour(s))  Surgical PCR screen     Status: None   Collection Time: 05/10/17  5:03 PM  Result Value Ref Range Status   MRSA, PCR NEGATIVE NEGATIVE Final   Staphylococcus aureus NEGATIVE NEGATIVE Final    Comment: (NOTE) The Xpert SA Assay (FDA approved for NASAL specimens in patients 67 years of age and older), is one component of a comprehensive surveillance program. It is not intended to diagnose infection nor to guide or monitor treatment.      Labs: BNP (last 3 results) Recent Labs    04/05/17 1947  BNP 626.9*   Basic Metabolic Panel: Recent Labs  Lab 05/09/17 1820 05/10/17 0554 05/11/17 0410  NA 130* 133* 135  K 4.1 4.0 4.4  CL 94* 96* 100*  CO2 25 27 25  GLUCOSE 344* 251* 243*  BUN 44* 47* 50*  CREATININE 5.53* 5.65* 5.47*  CALCIUM 8.3* 8.2* 8.0*   Liver Function Tests: Recent Labs  Lab 05/09/17 1820  AST 17  ALT 9*  ALKPHOS 77  BILITOT 0.5  PROT 6.3*  ALBUMIN 3.1*   No results for input(s): LIPASE, AMYLASE in the last 168 hours. No results for input(s): AMMONIA in the last 168 hours. CBC: Recent Labs  Lab 05/09/17 1820 05/10/17 0133 05/10/17 0554 05/10/17 1024 05/11/17 0410 05/12/17 1318  WBC 6.4 5.3 5.1 4.7 5.4 4.7  NEUTROABS 3.6  --   --   --  2.7  --   HGB 9.2* 8.8* 8.5* 8.9* 8.8* 9.1*  HCT 28.6* 27.0* 26.3* 27.7* 27.4* 28.4*  MCV 77.5* 76.7* 77.8* 78.0 77.6* 77.6*  PLT 311 288 273 290 292 250   Cardiac Enzymes: No results for input(s): CKTOTAL, CKMB, CKMBINDEX, TROPONINI in the last 168 hours. BNP: Invalid input(s): POCBNP CBG: Recent Labs  Lab 05/11/17 1244 05/11/17 1703 05/11/17 2053 05/12/17 0800 05/12/17 1134  GLUCAP 298* 187* 164* 262* 275*   D-Dimer No results for input(s): DDIMER in the last 72 hours. Hgb A1c No results for input(s): HGBA1C in the last 72 hours. Lipid Profile No results for input(s): CHOL, HDL, LDLCALC, TRIG, CHOLHDL, LDLDIRECT in the last 72 hours. Thyroid function studies No results for input(s):  TSH, T4TOTAL, T3FREE, THYROIDAB in the last 72 hours.  Invalid input(s): FREET3 Anemia work up No results for input(s): VITAMINB12, FOLATE, FERRITIN, TIBC, IRON, RETICCTPCT in the last 72 hours. Urinalysis    Component Value Date/Time   COLORURINE YELLOW 04/25/2017 1940   APPEARANCEUR CLEAR 04/25/2017 1940   LABSPEC 1.013 04/25/2017 1940   PHURINE 8.0 04/25/2017 1940   GLUCOSEU NEGATIVE 04/25/2017 1940   HGBUR NEGATIVE 04/25/2017 [place] NEGATIVE 04/25/2017 1940   KETONESUR 20 (A) 04/25/2017 1940   PROTEINUR 100 (A) 04/25/2017 1940   NITRITE NEGATIVE 04/25/2017 1940   LEUKOCYTESUR TRACE (A) 04/25/2017 1940   Sepsis Labs Invalid input(s): PROCALCITONIN,  WBC,  LACTICIDVEN Microbiology Recent Results (from the past 240 hour(s))  Surgical PCR screen     Status: None   Collection Time: 05/10/17  5:03 PM  Result Value Ref Range Status   MRSA, PCR NEGATIVE NEGATIVE Final   Staphylococcus aureus NEGATIVE NEGATIVE Final    Comment: (NOTE) The Xpert SA Assay (FDA approved for NASAL specimens in patients 44 years of age and older), is one component of a comprehensive surveillance program. It is not intended to diagnose infection nor to guide or monitor treatment.     Time coordinating discharge: 35 minutes  SIGNED:  Chipper Oman, MD  [facility] 05/12/2017, 1:48 PM  Pager please text page via  [email or web address]

## 2017-05-12 NOTE — Progress Notes (Signed)
Transport returned patient to room from HD. The patient's chart was not returned to nurses station, nor is it in the patients room. I am in the process of tracking down transportation to see where patients chart was placed. Patient cannot be discharged with out her Ashlee Miles DNR form.

## 2017-05-12 NOTE — Progress Notes (Deleted)
Patient's chart retrieved, Ashlee Miles DNR form and discharge packet complete and in chart. Patient was told we will be calling transportation at 0800 tomorrow morning 11/24. Patient verbalized that she understands and is okay with leaving first thing in the morning.

## 2017-05-12 NOTE — Progress Notes (Signed)
[facility] Progress Note   Subjective:  Awake, alert, oriented X 3. Not eating breakfast says she just isn't hungry. Denies N,V, pruritis-concerned about possible uremia as she has missed HD Tuesday missed HD 11/16. HD today on schedule. Has ecchymosis under eyes from previous fall. Frustrated, wants to get to HD so she can leave   Objective Vitals:   05/11/17 0938 05/11/17 1755 05/11/17 2058 05/12/17 0630  BP: 131/62 (!) 149/75 (!) 141/39 130/70  Pulse: 67 67 70 69  Resp: 17 18 17 18   Temp: 98.4 F (36.9 C) 98.4 F (36.9 C) 98.1 F (36.7 C) 98.5 F (36.9 C)  TempSrc: Oral Oral Oral Oral  SpO2: 100% 98% 100% 100%  Weight:   98.7 kg (217 lb 10.3 oz)   Height:       Physical Exam General: WNWD elderly female in NAD Heart: S1,S2, HD slightly distant. No M/R/G Lungs: CTAB A/P Abdomen:Active BS Extremities: Trace BLE edema.  Dialysis Access: L thigh AVG tender to palpation.    Additional Objective Labs: Basic Metabolic Panel: Recent Labs  Lab 05/09/17 1820 05/10/17 0554 05/11/17 0410  NA 130* 133* 135  K 4.1 4.0 4.4  CL 94* 96* 100*  CO2 25 27 25   GLUCOSE 344* 251* 243*  BUN 44* 47* 50*  CREATININE 5.53* 5.65* 5.47*  CALCIUM 8.3* 8.2* 8.0*   Liver Function Tests: Recent Labs  Lab 05/09/17 1820  AST 17  ALT 9*  ALKPHOS 77  BILITOT 0.5  PROT 6.3*  ALBUMIN 3.1*   No results for input(s): LIPASE, AMYLASE in the last 168 hours. CBC: Recent Labs  Lab 05/09/17 1820 05/10/17 0133 05/10/17 0554 05/10/17 1024 05/11/17 0410  WBC 6.4 5.3 5.1 4.7 5.4  NEUTROABS 3.6  --   --   --  2.7  HGB 9.2* 8.8* 8.5* 8.9* 8.8*  HCT 28.6* 27.0* 26.3* 27.7* 27.4*  MCV 77.5* 76.7* 77.8* 78.0 77.6*  PLT 311 288 273 290 292   Blood Culture    Component Value Date/Time   SDES URINE, RANDOM 03/13/2017 1605   SPECREQUEST NONE 03/13/2017 1605   CULT MULTIPLE SPECIES PRESENT, SUGGEST RECOLLECTION (A) 03/13/2017 1605   REPTSTATUS 03/15/2017 FINAL 03/13/2017 1605     Cardiac Enzymes: No results for input(s): CKTOTAL, CKMB, CKMBINDEX, TROPONINI in the last 168 hours. CBG: Recent Labs  Lab 05/11/17 0818 05/11/17 1244 05/11/17 1703 05/11/17 2053 05/12/17 0800  GLUCAP 242* 298* 187* 164* 262*   Iron Studies: No results for input(s): IRON, TIBC, TRANSFERRIN, FERRITIN in the last 72 hours. 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 EXAM: INTRAOPERATIVE ANGIOGRAPHY TECHNIQUE: Cholangiographic images from the C-arm fluoroscopic device were submitted for interpretation post-operatively. Please see the procedural report for the amount of contrast and the fluoroscopy time utilized. COMPARISON:  None. FINDINGS: Intraoperative images of a left thigh shuntogram. Initial images demonstrate the pseudoaneurysm originating from the medial aspect of the graft. Subsequently, a covered stent was placed across the pseudoaneurysm. Final images demonstrate markedly decreased filling of the pseudoaneurysm at the end of the procedure. Outflow vein is patent. IMPRESSION: Treatment of the dialysis graft pseudoaneurysm with a covered stent. Electronically Signed   By: Markus Daft M.D.   On: 05/12/2017 08:13   Medications: . sodium chloride 10 mL/hr at 05/10/17 2302   . calcitRIOL  0.5 mcg Oral Daily  . carvedilol  6.25 mg Oral BID WC  . darbepoetin (ARANESP)  injection - DIALYSIS  200 mcg Intravenous Q Fri-HD  . docusate sodium  100 mg Oral BID  . famotidine  20 mg Oral Daily  . feeding supplement (NEPRO CARB STEADY)  237 mL Oral TID  . feeding supplement (PRO-STAT SUGAR FREE 64)  30 mL Oral BID  . insulin aspart  0-9 Units Subcutaneous TID WC  . linaclotide  145 mcg Oral QAC breakfast  . midodrine  10 mg Oral Q M,W,F  . multivitamin  1 tablet Oral QHS  . pantoprazole  40 mg Oral BID AC  . PARoxetine  10 mg Oral Daily  . polyethylene glycol  17  g Oral Daily  . simvastatin  40 mg Oral Daily  . sucralfate  1 g Oral TID AC & HS  . Warfarin - Pharmacist Dosing Inpatient   Does not apply q1800    Dialysis Orders:  MWF at [facility] 4hr, BFR 400, DFR 800, EDW 99.5kg, 3K/2.25Ca, thigh AVG, heparin 3000 units IV bolus - Hectoral 66mcg IV q HD - Mircera 16mcg IV q 2 weeks (last 11/2) - No binders  Assessment/Plan: 1.  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 2.  ESRD: Usually MWF schedule, but last dialyzed Sunday. HD today on schedule. K+ 4.4.  3.  Hypertension/volume: BP controlled, slightly up volume wise on exam. UF with HD tomorrow. 4.  Anemia: Hgb 8.8, will re-dose her ESA tomorrow (Aranesp 200 ordered) 5.  Metabolic bone disease: Ca ok, continue VDRA. Phos pending. 6.  Nutrition: Alb 3.1. Adding Nepro. 7.  Type 2 DM: Per primary. 8. 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  Disposition: DC home today post HD per primary  Rita H. Kasper NP-C 05/12/2017, 9:11 AM  [facility] [phone]  Patient seen and examined, agree with above note with above modifications. Feels well, having some pain but mostly frustrated as she has not been to HD yet as will go home after.  Is OK for discharge from our standpoint after HD  Corliss Parish, MD 05/12/2017

## 2017-05-12 NOTE — Care Management Note (Signed)
Case Management Note  Patient Details  Name: Ashlee Miles MRN: [id number] Date of Birth: [birth year]  Subjective/Objective:         CM following for progression and d/c planning.            Action/Plan: 05/12/2017 Met with pt re plan to d/c . Pt plans to return to SNF, CSW notified. Pt currently asking for vas surgery to eval her leg which is very painful at this time. This CM texted this pt concern to attending MD.   Expected Discharge Date:                  Expected Discharge Plan:  [place]  In-House Referral:  Clinical Social Work  Discharge planning Services  NA  Post Acute Care Choice:  NA Choice offered to:     DME Arranged:  N/A DME Agency:  NA  HH Arranged:  NA HH Agency:  NA  Status of Service:  Completed, signed off  If discussed at [place] of Stay Meetings, dates discussed:    Additional Comments:  Adron Bene, RN 05/12/2017, 11:06 AM

## 2017-05-12 NOTE — Progress Notes (Signed)
Upon arrival from dialysis patient states "she does not feel comfortable leaving tonight".  I told her that her discharge is in, she has everything set up for discharge tonight. Patient states it is her "right" to stay here and that we cannot kick her out. I spoke with her daughter, [name] who became very upset that we wanted to discharge her mom tonight. [name] said her mom has the money to pay for an extra nights stay here. I told her it is not about the money so much as it is withholding a bed from patients in the emergency department needing placement.   I spoke with Dr. Quincy Simmonds who said he will not remove the discharge for tonight because it is not medically necessary for her to stay.  I called the [facility] to see what the protocol is in this situation. I was told that the patient can stay the night but to let her know PTAR will be called at 0800 05/13/2017. If patient refuses to leave at 0800 tomorrow morning then we will have to escort her out with security.   I relayed this message to the patient, that she will have to leave tomorrow at 0800. Discharge packet complete and in patients chart.

## 2017-05-13 LAB — PROTIME-INR
INR: 1.41
Prothrombin Time: 17.2 seconds — ABNORMAL HIGH (ref 11.4–15.2)

## 2017-05-13 LAB — GLUCOSE, CAPILLARY: Glucose-Capillary: 228 mg/dL — ABNORMAL HIGH (ref 65–99)

## 2017-05-13 NOTE — Progress Notes (Signed)
Report called to [place] at [employer]. [facility] on site to provide transport. Bartholomew Crews, RN

## 2017-05-13 NOTE — Progress Notes (Signed)
CSW confirmed that pt is able to return to [facility] today. CSW has updated RN and pt's daughter [name] of transport at this time. RN to call report to [phone]. [facility]AR has been called and scheduled for 11am. CSW signing off.     Virgie Dad Michai Dieppa, MSW, [place] [facility] Clinical Social Worker [phone]

## 2017-05-13 NOTE — Progress Notes (Signed)
Ashlee Miles Progress Note   Subjective:  Seen in room, still with some residual L leg pain, improved from admit. Plan is for d/c today. No CP or dyspnea. HD yest , removed 1700 tol'd well   Objective Vitals:   05/12/17 1842 05/12/17 2108 05/13/17 0516 05/13/17 0921  BP: (!) 168/74 (!) 134/40 (!) 130/45 (!) 107/41  Pulse: 70 66 67 79  Resp: 19 19 18 19   Temp: 98.1 F (36.7 C) 98.5 F (36.9 C) 98.5 F (36.9 C) 98 F (36.7 C)  TempSrc: Oral Oral Oral Oral  SpO2: 95% 100% 98% 98%  Weight:  97.3 kg (214 lb 8.1 oz)    Height:       Physical Exam General: WNWD elderly female in NAD Heart: S1,S2, HD slightly distant. No M/R/G Lungs: CTAB A/P Abdomen:Active BS Extremities: Trace BLE edema.  Dialysis Access: L thigh AVG tender to palpation.   Additional Objective Labs: Basic Metabolic Panel: Recent Labs  Lab 05/10/17 0554 05/11/17 0410 05/12/17 1318  NA 133* 135 137  K 4.0 4.4 3.9  CL 96* 100* 103  CO2 27 25 26   GLUCOSE 251* 243* 169*  BUN 47* 50* 47*  CREATININE 5.65* 5.47* 4.39*  CALCIUM 8.2* 8.0* 8.5*  PHOS  --   --  2.9   Liver Function Tests: Recent Labs  Lab 05/09/17 1820 05/12/17 1318  AST 17  --   ALT 9*  --   ALKPHOS 77  --   BILITOT 0.5  --   PROT 6.3*  --   ALBUMIN 3.1* 3.0*   CBC: Recent Labs  Lab 05/09/17 1820 05/10/17 0133 05/10/17 0554 05/10/17 1024 05/11/17 0410 05/12/17 1318  WBC 6.4 5.3 5.1 4.7 5.4 4.7  NEUTROABS 3.6  --   --   --  2.7  --   HGB 9.2* 8.8* 8.5* 8.9* 8.8* 9.1*  HCT 28.6* 27.0* 26.3* 27.7* 27.4* 28.4*  MCV 77.5* 76.7* 77.8* 78.0 77.6* 77.6*  PLT 311 288 273 290 292 250   Blood Culture    Component Value Date/Time   SDES URINE, RANDOM 03/13/2017 1605   SPECREQUEST NONE 03/13/2017 1605   CULT MULTIPLE SPECIES PRESENT, SUGGEST RECOLLECTION (A) 03/13/2017 1605   REPTSTATUS 03/15/2017 FINAL 03/13/2017 1605    Cardiac Enzymes: No results for input(s): CKTOTAL, CKMB, CKMBINDEX, TROPONINI in the last  168 hours. CBG: Recent Labs  Lab 05/12/17 0800 05/12/17 1134 05/12/17 1806 05/12/17 2105 05/13/17 0734  GLUCAP 262* 275* 197* 302* 228*   Medications: . sodium chloride 10 mL/hr at 05/10/17 2302   . calcitRIOL  0.5 mcg Oral Daily  . carvedilol  6.25 mg Oral BID WC  . darbepoetin (ARANESP) injection - DIALYSIS  200 mcg Intravenous Q Fri-HD  . docusate sodium  100 mg Oral BID  . famotidine  20 mg Oral Daily  . feeding supplement (NEPRO CARB STEADY)  237 mL Oral TID  . feeding supplement (PRO-STAT SUGAR FREE 64)  30 mL Oral BID  . insulin aspart  0-9 Units Subcutaneous TID WC  . linaclotide  145 mcg Oral QAC breakfast  . midodrine  10 mg Oral Q M,W,F  . multivitamin  1 tablet Oral QHS  . pantoprazole  40 mg Oral BID AC  . PARoxetine  10 mg Oral Daily  . polyethylene glycol  17 g Oral Daily  . simvastatin  40 mg Oral Daily  . sucralfate  1 g Oral TID AC & HS  . Warfarin - Pharmacist Dosing Inpatient  Does not apply q1800    Dialysis Orders: MWF at [facility] 4hr, BFR 400, DFR 800, EDW 99.5kg, 3K/2.25Ca, thigh AVG, heparin 3000 units IV bolus - Ashlee Miles 44mcg IV q HD - Ashlee Miles 117mcg IV q 2 weeks (last 11/2) - No binders  Assessment/Plan: 1. 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 2. ESRD:Usually MWF schedule, next HD 11/26. 3. Hypertension/volume:BP controlled. 4. Anemia:Hgb 9.1, s/p Aranesp 200 96/88. 5. Metabolic bone disease:Ca/Phos ok, continue VDRA.  6. Nutrition:Alb 3.1. Adding Nepro. 7. Type 2 DM:Per primary. 8. Hx DVT (protein C deficiency): Warfarin initially on hold, now resumed.    Ashlee Penton, PA-C 05/13/2017, 9:40 AM  Ashlee Miles Pager: [phone]  Patient seen and examined, agree with above note with above modifications. Leaving today- no issues with HD yest- will resume reg scheduled HD as OP on Monday  Ashlee Parish, MD 05/13/2017

## 2017-05-13 NOTE — Progress Notes (Signed)
[facility]   Patient stayed overnight, due not feeling comfortable on traveling to her SNF. Seen this morning, no acute changes overnight. Patient felt light headache when walker to the bathroom this AM by her self. VSS this resolved spontaneously. Patient remains stable for discharge. See d/c summary from 05/12/17 @ 3:09 PM for full details.  AVG - good thrill, hematoma decreasing   Chipper Oman, MD

## 2017-05-31 ENCOUNTER — Emergency Department (HOSPITAL_BASED_OUTPATIENT_CLINIC_OR_DEPARTMENT_OTHER): Admit: 2017-05-31 | Discharge: 2017-05-31 | Disposition: A | Discharge status: Home / Self Care | Payer: 59

## 2017-05-31 ENCOUNTER — Other Ambulatory Visit: Payer: Self-pay

## 2017-05-31 ENCOUNTER — Inpatient Hospital Stay (HOSPITAL_COMMUNITY)
Admission: EM | Admit: 2017-05-31 | Discharge: 2017-06-09 | DRG: 314 | Disposition: A | Discharge status: Home Health | Payer: 59 | Attending: Internal Medicine | Admitting: Internal Medicine

## 2017-05-31 ENCOUNTER — Encounter (HOSPITAL_COMMUNITY): Payer: Self-pay | Admitting: Emergency Medicine

## 2017-05-31 DIAGNOSIS — F329 Major depressive disorder, single episode, unspecified: Secondary | ICD-10-CM | POA: Diagnosis present

## 2017-05-31 DIAGNOSIS — Z66 Do not resuscitate: Secondary | ICD-10-CM | POA: Diagnosis present

## 2017-05-31 DIAGNOSIS — E1122 Type 2 diabetes mellitus with diabetic chronic kidney disease: Secondary | ICD-10-CM

## 2017-05-31 DIAGNOSIS — F32A Depression, unspecified: Secondary | ICD-10-CM | POA: Diagnosis present

## 2017-05-31 DIAGNOSIS — D62 Acute posthemorrhagic anemia: Secondary | ICD-10-CM | POA: Diagnosis present

## 2017-05-31 DIAGNOSIS — S7012XA Contusion of left thigh, initial encounter: Secondary | ICD-10-CM | POA: Diagnosis not present

## 2017-05-31 DIAGNOSIS — M7989 Other specified soft tissue disorders: Secondary | ICD-10-CM | POA: Diagnosis not present

## 2017-05-31 DIAGNOSIS — L89312 Pressure ulcer of right buttock, stage 2: Secondary | ICD-10-CM | POA: Diagnosis present

## 2017-05-31 DIAGNOSIS — I825Y9 Chronic embolism and thrombosis of unspecified deep veins of unspecified proximal lower extremity: Secondary | ICD-10-CM | POA: Diagnosis not present

## 2017-05-31 DIAGNOSIS — Z7901 Long term (current) use of anticoagulants: Secondary | ICD-10-CM

## 2017-05-31 DIAGNOSIS — E1152 Type 2 diabetes mellitus with diabetic peripheral angiopathy with gangrene: Secondary | ICD-10-CM | POA: Diagnosis present

## 2017-05-31 DIAGNOSIS — K219 Gastro-esophageal reflux disease without esophagitis: Secondary | ICD-10-CM | POA: Diagnosis present

## 2017-05-31 DIAGNOSIS — Z794 Long term (current) use of insulin: Secondary | ICD-10-CM | POA: Diagnosis not present

## 2017-05-31 DIAGNOSIS — I5032 Chronic diastolic (congestive) heart failure: Secondary | ICD-10-CM | POA: Diagnosis present

## 2017-05-31 DIAGNOSIS — N186 End stage renal disease: Secondary | ICD-10-CM

## 2017-05-31 DIAGNOSIS — G894 Chronic pain syndrome: Secondary | ICD-10-CM | POA: Diagnosis present

## 2017-05-31 DIAGNOSIS — I132 Hypertensive heart and chronic kidney disease with heart failure and with stage 5 chronic kidney disease, or end stage renal disease: Secondary | ICD-10-CM | POA: Diagnosis present

## 2017-05-31 DIAGNOSIS — N189 Chronic kidney disease, unspecified: Secondary | ICD-10-CM | POA: Diagnosis present

## 2017-05-31 DIAGNOSIS — R1032 Left lower quadrant pain: Secondary | ICD-10-CM | POA: Diagnosis present

## 2017-05-31 DIAGNOSIS — R4182 Altered mental status, unspecified: Secondary | ICD-10-CM

## 2017-05-31 DIAGNOSIS — Z6841 Body Mass Index (BMI) 40.0 and over, adult: Secondary | ICD-10-CM

## 2017-05-31 DIAGNOSIS — Z86718 Personal history of other venous thrombosis and embolism: Secondary | ICD-10-CM | POA: Diagnosis not present

## 2017-05-31 DIAGNOSIS — D631 Anemia in chronic kidney disease: Secondary | ICD-10-CM

## 2017-05-31 DIAGNOSIS — F418 Other specified anxiety disorders: Secondary | ICD-10-CM | POA: Diagnosis present

## 2017-05-31 DIAGNOSIS — Z992 Dependence on renal dialysis: Secondary | ICD-10-CM

## 2017-05-31 DIAGNOSIS — G459 Transient cerebral ischemic attack, unspecified: Secondary | ICD-10-CM | POA: Diagnosis not present

## 2017-05-31 DIAGNOSIS — Z452 Encounter for adjustment and management of vascular access device: Secondary | ICD-10-CM

## 2017-05-31 DIAGNOSIS — G92 Toxic encephalopathy: Secondary | ICD-10-CM | POA: Diagnosis not present

## 2017-05-31 DIAGNOSIS — E8889 Other specified metabolic disorders: Secondary | ICD-10-CM | POA: Diagnosis present

## 2017-05-31 DIAGNOSIS — R404 Transient alteration of awareness: Secondary | ICD-10-CM | POA: Diagnosis not present

## 2017-05-31 DIAGNOSIS — T82868S Thrombosis of vascular prosthetic devices, implants and grafts, sequela: Secondary | ICD-10-CM | POA: Diagnosis not present

## 2017-05-31 DIAGNOSIS — N2581 Secondary hyperparathyroidism of renal origin: Secondary | ICD-10-CM | POA: Diagnosis present

## 2017-05-31 DIAGNOSIS — K5900 Constipation, unspecified: Secondary | ICD-10-CM | POA: Diagnosis present

## 2017-05-31 DIAGNOSIS — M79609 Pain in unspecified limb: Secondary | ICD-10-CM

## 2017-05-31 DIAGNOSIS — R238 Other skin changes: Secondary | ICD-10-CM | POA: Diagnosis present

## 2017-05-31 DIAGNOSIS — I10 Essential (primary) hypertension: Secondary | ICD-10-CM | POA: Diagnosis not present

## 2017-05-31 DIAGNOSIS — T82838A Hemorrhage of vascular prosthetic devices, implants and grafts, initial encounter: Principal | ICD-10-CM | POA: Diagnosis present

## 2017-05-31 DIAGNOSIS — L8990 Pressure ulcer of unspecified site, unspecified stage: Secondary | ICD-10-CM | POA: Diagnosis present

## 2017-05-31 DIAGNOSIS — D6859 Other primary thrombophilia: Secondary | ICD-10-CM | POA: Diagnosis present

## 2017-05-31 DIAGNOSIS — I82409 Acute embolism and thrombosis of unspecified deep veins of unspecified lower extremity: Secondary | ICD-10-CM | POA: Diagnosis present

## 2017-05-31 DIAGNOSIS — F419 Anxiety disorder, unspecified: Secondary | ICD-10-CM | POA: Diagnosis present

## 2017-05-31 DIAGNOSIS — R509 Fever, unspecified: Secondary | ICD-10-CM | POA: Diagnosis present

## 2017-05-31 DIAGNOSIS — M79605 Pain in left leg: Secondary | ICD-10-CM

## 2017-05-31 DIAGNOSIS — R109 Unspecified abdominal pain: Secondary | ICD-10-CM

## 2017-05-31 DIAGNOSIS — G9341 Metabolic encephalopathy: Secondary | ICD-10-CM | POA: Diagnosis not present

## 2017-05-31 LAB — CBC WITH DIFFERENTIAL/PLATELET
Basophils Absolute: 0 10*3/uL (ref 0.0–0.1)
Basophils Relative: 0 %
Eosinophils Absolute: 0.2 10*3/uL (ref 0.0–0.7)
Eosinophils Relative: 2 %
HCT: 22.1 % — ABNORMAL LOW (ref 36.0–46.0)
Hemoglobin: 7 g/dL — ABNORMAL LOW (ref 12.0–15.0)
Lymphocytes Relative: 16 %
Lymphs Abs: 1.6 10*3/uL (ref 0.7–4.0)
MCH: 25.2 pg — ABNORMAL LOW (ref 26.0–34.0)
MCHC: 31.7 g/dL (ref 30.0–36.0)
MCV: 79.5 fL (ref 78.0–100.0)
Monocytes Absolute: 0.3 10*3/uL (ref 0.1–1.0)
Monocytes Relative: 3 %
Neutro Abs: 7.5 10*3/uL (ref 1.7–7.7)
Neutrophils Relative %: 79 %
Platelets: 322 10*3/uL (ref 150–400)
RBC: 2.78 MIL/uL — ABNORMAL LOW (ref 3.87–5.11)
RDW: 17.3 % — ABNORMAL HIGH (ref 11.5–15.5)
WBC: 9.5 10*3/uL (ref 4.0–10.5)

## 2017-05-31 LAB — COMPREHENSIVE METABOLIC PANEL
ALT: 8 U/L — ABNORMAL LOW (ref 14–54)
AST: 17 U/L (ref 15–41)
Albumin: 2.9 g/dL — ABNORMAL LOW (ref 3.5–5.0)
Alkaline Phosphatase: 77 U/L (ref 38–126)
Anion gap: 11 (ref 5–15)
BUN: 47 mg/dL — ABNORMAL HIGH (ref 6–20)
CO2: 24 mmol/L (ref 22–32)
Calcium: 9 mg/dL (ref 8.9–10.3)
Chloride: 107 mmol/L (ref 101–111)
Creatinine, Ser: 4.99 mg/dL — ABNORMAL HIGH (ref 0.44–1.00)
GFR calc Af Amer: 9 mL/min — ABNORMAL LOW (ref >60)
GFR calc non Af Amer: 8 mL/min — ABNORMAL LOW (ref >60)
Glucose, Bld: 89 mg/dL (ref 65–99)
Potassium: 4.2 mmol/L (ref 3.5–5.1)
Sodium: 142 mmol/L (ref 135–145)
Total Bilirubin: 0.8 mg/dL (ref 0.3–1.2)
Total Protein: 6.6 g/dL (ref 6.5–8.1)

## 2017-05-31 LAB — RETICULOCYTES
RBC.: 2.26 MIL/uL — ABNORMAL LOW (ref 3.87–5.11)
Retic Count, Absolute: 47.5 10*3/uL (ref 19.0–186.0)
Retic Ct Pct: 2.1 % (ref 0.4–3.1)

## 2017-05-31 LAB — PROTIME-INR
INR: 3.89
Prothrombin Time: 37.9 seconds — ABNORMAL HIGH (ref 11.4–15.2)

## 2017-05-31 LAB — I-STAT CG4 LACTIC ACID, ED: Lactic Acid, Venous: 1.74 mmol/L (ref 0.5–1.9)

## 2017-05-31 LAB — GLUCOSE, CAPILLARY: Glucose-Capillary: 100 mg/dL — ABNORMAL HIGH (ref 65–99)

## 2017-05-31 MED ORDER — SENNOSIDES-DOCUSATE SODIUM 8.6-50 MG PO TABS: 1.0000 | ORAL_TABLET | Freq: Every evening | ORAL | Status: DC | PRN

## 2017-05-31 MED ORDER — GABAPENTIN 300 MG PO CAPS
300.0000 mg | ORAL_CAPSULE | Freq: Three times a day (TID) | ORAL | Status: DC
  Administered 2017-05-31 – 2017-06-05 (×13): 300 mg via ORAL
  Filled 2017-05-31 (×13): qty 1

## 2017-05-31 MED ORDER — INSULIN ASPART 100 UNIT/ML ~~LOC~~ SOLN
0.0000 [IU] | Freq: Every day | SUBCUTANEOUS | Status: DC
Start: 2017-05-31 — End: 2017-06-10
  Administered 2017-06-06: 2 [IU] via SUBCUTANEOUS

## 2017-05-31 MED ORDER — INSULIN ASPART 100 UNIT/ML ~~LOC~~ SOLN
0.0000 [IU] | Freq: Three times a day (TID) | SUBCUTANEOUS | Status: DC
  Administered 2017-06-02 (×2): 3 [IU] via SUBCUTANEOUS
  Administered 2017-06-04 – 2017-06-06 (×4): 2 [IU] via SUBCUTANEOUS
  Administered 2017-06-06 – 2017-06-07 (×2): 1 [IU] via SUBCUTANEOUS
  Administered 2017-06-07 – 2017-06-08 (×3): 2 [IU] via SUBCUTANEOUS
  Administered 2017-06-08: 7 [IU] via SUBCUTANEOUS
  Administered 2017-06-09: 2 [IU] via SUBCUTANEOUS

## 2017-05-31 MED ORDER — SODIUM CHLORIDE 0.9% FLUSH
10.0000 mL | INTRAVENOUS | Status: DC | PRN
  Administered 2017-05-31 – 2017-06-01 (×2): 10 mL
  Filled 2017-05-31 (×2): qty 40

## 2017-05-31 MED ORDER — RENA-VITE PO TABS
1.0000 | ORAL_TABLET | Freq: Every day | ORAL | Status: DC
  Administered 2017-05-31 – 2017-06-08 (×8): 1 via ORAL
  Filled 2017-05-31 (×8): qty 1

## 2017-05-31 MED ORDER — MIDODRINE HCL 5 MG PO TABS
10.0000 mg | ORAL_TABLET | ORAL | Status: DC
  Administered 2017-06-06 – 2017-06-09 (×2): 10 mg via ORAL

## 2017-05-31 MED ORDER — INSULIN GLARGINE 100 UNIT/ML ~~LOC~~ SOLN
3.0000 [IU] | Freq: Every day | SUBCUTANEOUS | Status: DC
  Administered 2017-05-31 – 2017-06-08 (×9): 3 [IU] via SUBCUTANEOUS
  Filled 2017-05-31 (×12): qty 0.03

## 2017-05-31 MED ORDER — OXYCODONE-ACETAMINOPHEN 5-325 MG PO TABS
1.0000 | ORAL_TABLET | Freq: Four times a day (QID) | ORAL | Status: DC | PRN
  Administered 2017-06-01 – 2017-06-07 (×12): 1 via ORAL
  Filled 2017-05-31 (×12): qty 1

## 2017-05-31 MED ORDER — PANTOPRAZOLE SODIUM 40 MG PO TBEC
40.0000 mg | DELAYED_RELEASE_TABLET | Freq: Every day | ORAL | Status: DC
  Administered 2017-06-01 – 2017-06-09 (×9): 40 mg via ORAL
  Filled 2017-05-31 (×9): qty 1

## 2017-05-31 MED ORDER — PAROXETINE HCL 10 MG PO TABS
10.0000 mg | ORAL_TABLET | Freq: Every day | ORAL | Status: DC
  Administered 2017-06-01 – 2017-06-09 (×9): 10 mg via ORAL
  Filled 2017-05-31 (×9): qty 1

## 2017-05-31 MED ORDER — OXYCODONE-ACETAMINOPHEN 5-325 MG PO TABS
1.0000 | ORAL_TABLET | Freq: Once | ORAL | Status: AC
  Administered 2017-05-31: 1 via ORAL
  Filled 2017-05-31: qty 1

## 2017-05-31 MED ORDER — CARVEDILOL 6.25 MG PO TABS
6.2500 mg | ORAL_TABLET | Freq: Two times a day (BID) | ORAL | Status: DC
  Administered 2017-06-01 – 2017-06-09 (×14): 6.25 mg via ORAL
  Filled 2017-05-31 (×14): qty 1

## 2017-05-31 MED ORDER — ACETAMINOPHEN 325 MG PO TABS: 650.0000 mg | ORAL_TABLET | Freq: Four times a day (QID) | ORAL | Status: DC | PRN

## 2017-05-31 MED ORDER — ACETAMINOPHEN 650 MG RE SUPP: 650.0000 mg | Freq: Four times a day (QID) | RECTAL | Status: DC | PRN

## 2017-05-31 MED ORDER — NEPRO/CARBSTEADY PO LIQD
237.0000 mL | Freq: Three times a day (TID) | ORAL | Status: DC | PRN
  Filled 2017-05-31: qty 237

## 2017-05-31 MED ORDER — LINACLOTIDE 145 MCG PO CAPS
145.0000 ug | ORAL_CAPSULE | Freq: Every day | ORAL | Status: DC | PRN
  Filled 2017-05-31: qty 1

## 2017-05-31 MED ORDER — CAMPHOR-MENTHOL 0.5-0.5 % EX LOTN
1.0000 "application " | TOPICAL_LOTION | Freq: Three times a day (TID) | CUTANEOUS | Status: DC | PRN
  Filled 2017-05-31: qty 222

## 2017-05-31 MED ORDER — ZOLPIDEM TARTRATE 5 MG PO TABS: 5.0000 mg | ORAL_TABLET | Freq: Every evening | ORAL | Status: DC | PRN

## 2017-05-31 MED ORDER — TRIAMCINOLONE ACETONIDE 0.1 % EX CREA
1.0000 "application " | TOPICAL_CREAM | Freq: Two times a day (BID) | CUTANEOUS | Status: DC | PRN
  Filled 2017-05-31: qty 15

## 2017-05-31 MED ORDER — CEFAZOLIN SODIUM-DEXTROSE 1-4 GM/50ML-% IV SOLN: 1.0000 g | INTRAVENOUS | Status: DC

## 2017-05-31 MED ORDER — SUCRALFATE 1 GM/10ML PO SUSP
1.0000 g | Freq: Three times a day (TID) | ORAL | Status: DC
  Administered 2017-06-01 – 2017-06-09 (×28): 1 g via ORAL
  Filled 2017-05-31 (×28): qty 10

## 2017-05-31 MED ORDER — SIMVASTATIN 40 MG PO TABS
40.0000 mg | ORAL_TABLET | Freq: Every day | ORAL | Status: DC
  Administered 2017-06-01 – 2017-06-09 (×8): 40 mg via ORAL
  Filled 2017-05-31 (×8): qty 1

## 2017-05-31 MED ORDER — ONDANSETRON HCL 4 MG PO TABS: 4.0000 mg | ORAL_TABLET | Freq: Four times a day (QID) | ORAL | Status: DC | PRN

## 2017-05-31 MED ORDER — CEFAZOLIN SODIUM-DEXTROSE 2-4 GM/100ML-% IV SOLN
2.0000 g | Freq: Once | INTRAVENOUS | Status: AC
  Administered 2017-05-31: 2 g via INTRAVENOUS
  Filled 2017-05-31: qty 100

## 2017-05-31 MED ORDER — ONDANSETRON HCL 4 MG/2ML IJ SOLN: 4.0000 mg | Freq: Four times a day (QID) | INTRAMUSCULAR | Status: DC | PRN

## 2017-05-31 MED ORDER — METHOCARBAMOL 500 MG PO TABS: 500.0000 mg | ORAL_TABLET | Freq: Three times a day (TID) | ORAL | Status: DC | PRN

## 2017-05-31 MED ORDER — ACETAMINOPHEN 325 MG PO TABS
650.0000 mg | ORAL_TABLET | Freq: Four times a day (QID) | ORAL | Status: DC | PRN
  Administered 2017-06-01 – 2017-06-06 (×2): 650 mg via ORAL
  Filled 2017-05-31 (×2): qty 2

## 2017-05-31 MED ORDER — HYDRALAZINE HCL 20 MG/ML IJ SOLN: 5.0000 mg | INTRAMUSCULAR | Status: DC | PRN

## 2017-05-31 MED ORDER — DOCUSATE SODIUM 100 MG PO CAPS
100.0000 mg | ORAL_CAPSULE | Freq: Two times a day (BID) | ORAL | Status: DC
  Administered 2017-05-31 – 2017-06-09 (×17): 100 mg via ORAL
  Filled 2017-05-31 (×18): qty 1

## 2017-05-31 MED ORDER — GUAIFENESIN ER 600 MG PO TB12: 600.0000 mg | ORAL_TABLET | Freq: Two times a day (BID) | ORAL | Status: DC | PRN

## 2017-05-31 MED ORDER — CEFAZOLIN SODIUM-DEXTROSE 1-4 GM/50ML-% IV SOLN
1.0000 g | INTRAVENOUS | Status: DC
  Administered 2017-06-02 – 2017-06-03 (×3): 1 g via INTRAVENOUS
  Filled 2017-05-31 (×5): qty 50

## 2017-05-31 MED ORDER — HYDROXYZINE HCL 25 MG PO TABS: 25.0000 mg | ORAL_TABLET | Freq: Three times a day (TID) | ORAL | Status: DC | PRN

## 2017-05-31 MED ORDER — CYCLOBENZAPRINE HCL 5 MG PO TABS: 5.0000 mg | ORAL_TABLET | Freq: Two times a day (BID) | ORAL | Status: DC | PRN

## 2017-05-31 MED ORDER — FAMOTIDINE 20 MG PO TABS: 20.0000 mg | ORAL_TABLET | Freq: Every day | ORAL | Status: DC

## 2017-05-31 NOTE — Consult Note (Signed)
Vascular and Vein Specialist of [facility]  Patient name: Ashlee Miles MRN: [id number] DOB: 03/07/1941 Sex: female   REQUESTING PROVIDER:    ER   REASON FOR CONSULT:    Left thigh pain, unable to have HD today  HISTORY OF PRESENT ILLNESS:   Ashlee Miles is a 76 y.o. female, who I saw as a consult on 05/10/2017 for pain in her left thigh following cannulation of her left thigh dialysis graft.  She had a hematoma on the medial side of her thigh.  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  She was taken to the operating room on 05/10/2017 and a covered stent was placed in order to treat a pseudoaneurysm.  She was sent from dialysis to the ER today with worsening pain.  She did not receive dialysis.  PAST MEDICAL HISTORY    Past Medical History:  Diagnosis Date  . CHF (congestive heart failure) ([place])   . Diabetes mellitus without complication ([place])   . ESRD (end stage renal disease) on dialysis ([place])   . History of pituitary adenoma   . Hypertension   . Protein C deficiency ([place])      FAMILY HISTORY   Family History  Problem Relation Age of Onset  . Breast cancer Mother   . Diabetes Sister   . Diabetes Brother   . CAD Other   . Stroke Neg Hx     SOCIAL HISTORY:   Social History   Socioeconomic History  . Marital status: Married    Spouse name: Not on file  . Number of children: Not on file  . Years of education: Not on file  . Highest education level: Not on file  Social Needs  . Financial resource strain: Not on file  . Food insecurity - worry: Not on file  . Food insecurity - inability: Not on file  . Transportation needs - medical: Not on file  . Transportation needs - non-medical: Not on file  Occupational History  . Occupation: retired  Tobacco Use  . Smoking status: Never Smoker  . Smokeless tobacco: Never Used  Substance and Sexual Activity  . Alcohol use: No  . Drug use: No  . Sexual activity: Not on  file  Other Topics Concern  . Not on file  Social History Narrative  . Not on file    ALLERGIES:    No Known Allergies  CURRENT MEDICATIONS:    Current Facility-Administered Medications  Medication Dose Route Frequency Provider Last Rate Last Dose  . acetaminophen (TYLENOL) tablet 650 mg  650 mg Oral Q6H PRN Ivor Costa, MD       Or  . acetaminophen (TYLENOL) suppository 650 mg  650 mg Rectal Q6H PRN Ivor Costa, MD      . Derrill Memo ON 06/01/2017] ceFAZolin (ANCEF) IVPB 1 g/50 mL premix  1 g Intravenous To SS-Surg Rhyne, Samantha J, PA-C      . [START ON 06/01/2017] ceFAZolin (ANCEF) IVPB 1 g/50 mL premix  1 g Intravenous Q24H Batchelder, Cecilio Asper, RPH      . ceFAZolin (ANCEF) IVPB 2g/100 mL premix  2 g Intravenous Once Reginia Naas, RPH      . hydrALAZINE (APRESOLINE) injection 5 mg  5 mg Intravenous Q2H PRN Ivor Costa, MD      . insulin aspart (novoLOG) injection 0-5 Units  0-5 Units Subcutaneous QHS Ivor Costa, MD      . Derrill Memo ON 06/01/2017] insulin aspart (novoLOG) injection 0-9 Units  0-9  Units Subcutaneous TID WC Ivor Costa, MD      . ondansetron [facility]) tablet 4 mg  4 mg Oral Q6H PRN Ivor Costa, MD       Or  . ondansetron (ZOFRAN) injection 4 mg  4 mg Intravenous Q6H PRN Ivor Costa, MD      . oxyCODONE-acetaminophen (PERCOCET/ROXICET) 5-325 MG per tablet 1 tablet  1 tablet Oral Q6H PRN Ivor Costa, MD      . senna-docusate (Senokot-S) tablet 1 tablet  1 tablet Oral QHS PRN Ivor Costa, MD      . zolpidem (AMBIEN) tablet 5 mg  5 mg Oral QHS PRN Ivor Costa, MD       Current Outpatient Medications  Medication Sig Dispense Refill  . acetaminophen (TYLENOL) 325 MG tablet Take 650 mg by mouth every 6 (six) hours as needed for mild pain.    . camphor-menthol (SARNA) lotion Apply 1 application topically every 8 (eight) hours as needed for itching. 222 mL 0  . carvedilol (COREG) 6.25 MG tablet Take 1 tablet (6.25 mg total) by mouth 2 (two) times daily. 60 tablet 0  .  cyclobenzaprine (FLEXERIL) 5 MG tablet Take 5 mg by mouth every 12 (twelve) hours as needed for muscle spasms.    Marland Kitchen docusate sodium (COLACE) 100 MG capsule Take 1 capsule (100 mg total) by mouth 2 (two) times daily. 10 capsule 0  . famotidine (PEPCID) 20 MG tablet Take 1 tablet (20 mg total) daily by mouth. 30 tablet 0  . gabapentin (NEURONTIN) 300 MG capsule Take 300 mg by mouth 3 (three) times daily.    Marland Kitchen guaiFENesin (MUCINEX) 600 MG 12 hr tablet Take 600 mg by mouth 2 (two) times daily as needed.    Marland Kitchen HYDROcodone-acetaminophen (NORCO/VICODIN) 5-325 MG tablet Take 1 tablet 3 (three) times daily as needed by mouth for severe pain. 15 tablet 0  . hydrOXYzine (ATARAX/VISTARIL) 25 MG tablet Take 1 tablet (25 mg total) by mouth every 8 (eight) hours as needed for itching. 30 tablet 0  . insulin aspart (NOVOLOG) 100 UNIT/ML injection Inject 0-9 Units into the skin 3 (three) times daily with meals. 10 mL 11  . insulin glargine (LANTUS) 100 UNIT/ML injection Inject 6 Units into the skin at bedtime.    Marland Kitchen linaclotide (LINZESS) 145 MCG CAPS capsule Take 145 mcg by mouth daily as needed.     . midodrine (PROAMATINE) 10 MG tablet Take 1 tablet (10 mg total) by mouth every Monday, Wednesday, and Friday with hemodialysis. 30 tablet 0  . multivitamin (RENA-VIT) TABS tablet Take 1 tablet by mouth at bedtime. 30 tablet 0  . omeprazole (PRILOSEC) 20 MG capsule Take 20 mg by mouth daily.    Marland Kitchen oxyCODONE (OXY IR/ROXICODONE) 5 MG immediate release tablet Take 5 mg by mouth every 4 (four) hours as needed for severe pain.    . pantoprazole (PROTONIX) 40 MG tablet Take 1 tablet (40 mg total) 2 (two) times daily before a meal by mouth. 60 tablet 0  . PARoxetine (PAXIL) 10 MG tablet Take 1 tablet (10 mg total) daily by mouth. 30 tablet 0  . ranitidine (ZANTAC) 150 MG tablet Take 150 mg by mouth 2 (two) times daily.    . simvastatin (ZOCOR) 40 MG tablet Take 40 mg by mouth daily at 6 PM.    . sucralfate (CARAFATE) 1 GM/10ML  suspension Take 10 mLs (1 g total) 4 (four) times daily -  with meals and at bedtime by mouth. 420 mL 0  .  triamcinolone cream (KENALOG) 0.1 % Apply 1 application topically every 12 (twelve) hours as needed.     . warfarin (COUMADIN) 5 MG tablet Take 1 tablet (5 mg total) by mouth daily at 6 PM. 30 tablet 0  . Amino Acids-Protein Hydrolys (FEEDING SUPPLEMENT, PRO-STAT SUGAR FREE 64,) LIQD Take 30 mLs by mouth 2 (two) times daily. (Patient not taking: Reported on 05/31/2017) 900 mL 0  . ipratropium-albuterol (DUONEB) 0.5-2.5 (3) MG/3ML SOLN Take 3 mLs by nebulization every 4 (four) hours as needed. (Patient not taking: Reported on 05/31/2017) 360 mL 0  . methocarbamol (ROBAXIN) 500 MG tablet Take 1 tablet (500 mg total) every 8 (eight) hours as needed by mouth for muscle spasms. (Patient not taking: Reported on 05/31/2017) 15 tablet 0  . Nutritional Supplements (FEEDING SUPPLEMENT, NEPRO CARB STEADY,) LIQD Take 237 mLs by mouth 3 (three) times daily as needed (Supplement). (Patient not taking: Reported on 05/31/2017) 30 Can 0  . polyethylene glycol (MIRALAX / GLYCOLAX) packet Take 17 g daily by mouth. (Patient not taking: Reported on 05/31/2017) 14 each 0    REVIEW OF SYSTEMS:   [X]  denotes positive finding, [ ]  denotes negative finding Cardiac  Comments:  Chest pain or chest pressure:    Shortness of breath upon exertion:    Short of breath when lying flat:    Irregular heart rhythm:        Vascular    Pain in calf, thigh, or hip brought on by ambulation:    Pain in feet at night that wakes you up from your sleep:     Blood clot in your veins:    Leg swelling:         Pulmonary    Oxygen at home:    Productive cough:     Wheezing:         Neurologic    Sudden weakness in arms or legs:     Sudden numbness in arms or legs:     Sudden onset of difficulty speaking or slurred speech:    Temporary loss of vision in one eye:     Problems with dizziness:         Gastrointestinal      Blood in stool:      Vomited blood:         Genitourinary    Burning when urinating:     Blood in urine:        Psychiatric    Major depression:         Hematologic    Bleeding problems:    Problems with blood clotting too easily:        Skin    Rashes or ulcers:        Constitutional    Fever or chills:     PHYSICAL EXAM:   Vitals:   05/31/17 2000 05/31/17 2015 05/31/17 2030 05/31/17 2115  BP: (!) 144/50 (!) 153/50 (!) 152/49 (!) 156/47  Pulse: 78 79  71  Resp:      Temp:      TempSrc:      SpO2: 99% 100%  100%  Weight:      Height:        GENERAL: The patient is a well-nourished female, in no acute distress. The vital signs are documented above. CARDIAC: There is a regular rate and rhythm.  VASCULAR: She continues to have a rather large hematoma on the medial side of her graft.  There are some skin changes over top  of what appears to be a expanding hematoma.  This is very tender to the touch and so full examination is difficult. PULMONARY: Nonlabored respirations ABDOMEN: Soft and non-tender with normal pitched bowel sounds.  MUSCULOSKELETAL: There are no major deformities or cyanosis. NEUROLOGIC: No focal weakness or paresthesias are detected. SKIN: There are no ulcers or rashes noted. PSYCHIATRIC: The patient has a normal affect.  STUDIES:   I have reviewed her ultrasound.  The graft is patent.  There is no DVT  ASSESSMENT and PLAN   Hematoma of left thigh dialysis graft: Despite exclusion of the patient's pseudoaneurysm with a covered stent approximately 3 weeks ago, the patient has had expansion of the hematoma as well as continued pain.  There is possible infection of the hematoma at this time.  I have recommended that the patient be admitted for reversal of her anticoagulation and necessary hemodialysis intervention.  If her coagulation profile is appropriate tomorrow, I would consider evacuation of her hematoma.  This could potentially involve removal of  her graft if it appears infected.   Annamarie Major, MD [facility] Specialists of [facility] [phone] Pager [phone]

## 2017-05-31 NOTE — Progress Notes (Signed)
Pharmacy Antibiotic Note  Jalei Shibley is a 76 y.o. female admitted on 05/31/2017 with L groin infection.  Pharmacy has been consulted for cefazolin dosing.  Plan: Give cefazolin 2g IV x 1, then start cefazolin 1g IV Q24h until HD schedule confirmed Monitor clinical picture, renal function F/U C&S, abx deescalation / LOT   Height: 5\' 1"  (154.9 cm) Weight: 214 lb (97.1 kg) IBW/kg (Calculated) : 47.8  Temp (24hrs), Avg:98.7 F (37.1 C), Min:98.3 F (36.8 C), Max:99.1 F (37.3 C)  Recent Labs  Lab 05/31/17 1618 05/31/17 1631  WBC 9.5  --   CREATININE 4.99*  --   LATICACIDVEN  --  1.74    Estimated Creatinine Clearance: 10.2 mL/min (A) (by C-G formula based on SCr of 4.99 mg/dL (H)).    No Known Allergies  Thank you for allowing pharmacy to be a part of this patient's care.  Reginia Naas 05/31/2017 8:58 PM

## 2017-05-31 NOTE — H&P (Addendum)
History and Physical    Christl Fessenden DGU:[id number] DOB: 08-22-1940 DOA: 05/31/2017  Referring MD/NP/PA:   PCP: Darlina Rumpf, MD   Patient coming from:  The patient is coming from home.  At baseline, pt is independent for most of ADL.   Chief Complaint: left groin pain and left leg swelling and pain  HPI: Ashlee Miles is a 76 y.o. female with medical history significant of ESRD-HD (MWF), hypertension, diabetes mellitus, DVT, protein C deficiency on Coumadin, pituitary adenoma, dCHF, GERD, depression, who presents with left groin pain and left leg swelling and pain.  Pt was recently hospitalized from 11/20-11/23 due to left groin pain secondary to pseudoaneurysm from AV fistulaand Hematoma.  Pt states that she continues to have pain in the left groin area and left upper inner thigh. She also has left leg swelling and the pain. The pain is constant, 10 out of 10 in severity, sharp, nonradiating. She states that the left upper inner thigh and groin area are warm. No fever or chills. Patient does not have chest pain, SOB, cough, denies symptoms of UTI. No nausea, vomiting, diarrhea or abdominal pain. Patient missed hemodialysis today.   ED Course: pt was found to have WBC 9.5, lactic acid 1.74, pending INR, potassium 4.2, bicarbonate of 24, creatinine 4.99, BUN 47, temperature 99.1, heart rate is 75, respiration rate 18, oxygen sat 96% on room air. Left lower extremity venous Doppler was done, which is limited study, but was negative for DVT.  Review of Systems:   General: no fevers, chills, has poor appetite, has fatigue HEENT: no blurry vision, hearing changes or sore throat Respiratory: no dyspnea, coughing, wheezing CV: no chest pain, no palpitations GI: no nausea, vomiting, abdominal pain, diarrhea, constipation GU: no dysuria, burning on urination, increased urinary frequency, hematuria  Ext: has leg edema Neuro: no unilateral weakness, numbness, or tingling, no vision change or  hearing loss Skin: has pain, swelling and warmth in the left groin area and left upper inner thigh. MSK: No muscle spasm, no deformity, no limitation of range of movement in spin Heme: No easy bruising.  Travel history: No recent long distant travel.  Allergy: No Known Allergies  Past Medical History:  Diagnosis Date  . CHF (congestive heart failure) ([place])   . Diabetes mellitus without complication ([place])   . ESRD (end stage renal disease) on dialysis ([place])   . History of pituitary adenoma   . Hypertension   . Protein C deficiency [facility])     Past Surgical History:  Procedure Laterality Date  . AV FISTULA PLACEMENT Left 03/16/2017   Procedure: INSERTION OF ARTERIOVENOUS (AV) GORE-TEX GRAFT THIGH-LEFT;  Surgeon: Angelia Mould, MD;  Location: [facility];  Service: Vascular;  Laterality: Left;  . CHOLECYSTECTOMY    . DIALYSIS FISTULA CREATION     clotted off  . FALSE ANEURYSM REPAIR Left 05/10/2017   Procedure: REPAIR FALSE ANEURYSM left thigh AVGG using Gore 36mmx5cm Viabahn Endoprosthesis;  Surgeon: Serafina Mitchell, MD;  Location: [facility] OR;  Service: Vascular;  Laterality: Left;  . INSERTION OF DIALYSIS CATHETER Right 03/16/2017   Procedure: INSERTION OF RIGHT FEMORAL TUNNELED DIALYSIS CATHETER;  Surgeon: Angelia Mould, MD;  Location: [facility];  Service: Vascular;  Laterality: Right;  . IVC FILTER INSERTION    . PORTACATH PLACEMENT      Social History:  reports that  has never smoked. she has never used smokeless tobacco. She reports that she does not drink alcohol or use drugs.  Family History:  Family History  Problem Relation Age of Onset  . Breast cancer Mother   . Diabetes Sister   . Diabetes Brother   . CAD Other   . Stroke Neg Hx      Prior to Admission medications   Medication Sig Start Date End Date Taking? Authorizing Provider  acetaminophen (TYLENOL) 325 MG tablet Take 650 mg by mouth every 6 (six) hours as needed for mild pain.   Yes [provider]  Amino Acids-Protein Hydrolys (FEEDING SUPPLEMENT, PRO-STAT SUGAR FREE 64,) LIQD Take 30 mLs by mouth 2 (two) times daily. Patient not taking: Reported on 05/31/2017 04/12/17   Raiford Noble Latif, DO  calcitRIOL (ROCALTROL) 0.5 MCG capsule Take 0.5 mcg by mouth daily.    [provider]  camphor-menthol Timoteo Ace) lotion Apply 1 application topically every 8 (eight) hours as needed for itching. 04/12/17   Sheikh, Omair Latif, DO  carvedilol (COREG) 6.25 MG tablet Take 1 tablet (6.25 mg total) by mouth 2 (two) times daily. 03/04/17 03/04/18  Hosie Poisson, MD  docusate sodium (COLACE) 100 MG capsule Take 1 capsule (100 mg total) by mouth 2 (two) times daily. 04/12/17   Raiford Noble Latif, DO  famotidine (PEPCID) 20 MG tablet Take 1 tablet (20 mg total) daily by mouth. 04/28/17   Lavina Hamman, MD  HYDROcodone-acetaminophen (NORCO/VICODIN) 5-325 MG tablet Take 1 tablet 3 (three) times daily as needed by mouth for severe pain. Patient taking differently: Take 1 tablet by mouth every 8 (eight) hours as needed (for pain).  04/28/17   Lavina Hamman, MD  hydrOXYzine (ATARAX/VISTARIL) 25 MG tablet Take 1 tablet (25 mg total) by mouth every 8 (eight) hours as needed for itching. 04/12/17   Sheikh, Omair Latif, DO  insulin aspart (NOVOLOG) 100 UNIT/ML injection Inject 0-9 Units into the skin 3 (three) times daily with meals. Patient taking differently: Inject 0-9 Units into the skin 3 (three) times daily with meals. Per sliding scale: BGL 110-149 = 0 units; 150-200 = 2 units; 201-250 = 4 units; 251-300 = 6 units; 301-350 = 8 units; 351-400 = 9 units; 401-450 = CALL MD 03/22/17   Debbe Odea, MD  ipratropium-albuterol (DUONEB) 0.5-2.5 (3) MG/3ML SOLN Take 3 mLs by nebulization every 4 (four) hours as needed. Patient taking differently: Take 3 mLs by nebulization every 4 (four) hours as needed (for shortness of breath).  04/12/17   Sheikh, Omair Latif, DO  linaclotide (LINZESS) 145 MCG CAPS capsule  Take 145 mcg by mouth daily before breakfast.    [provider]  methocarbamol (ROBAXIN) 500 MG tablet Take 1 tablet (500 mg total) every 8 (eight) hours as needed by mouth for muscle spasms. 04/28/17   Lavina Hamman, MD  midodrine (PROAMATINE) 10 MG tablet Take 1 tablet (10 mg total) by mouth every Monday, Wednesday, and Friday with hemodialysis. Patient taking differently: Take 10 mg by mouth every Monday, Wednesday, and Friday.  04/14/17   Raiford Noble Latif, DO  multivitamin (RENA-VIT) TABS tablet Take 1 tablet by mouth at bedtime. 04/12/17   Raiford Noble Latif, DO  Nutritional Supplements (FEEDING SUPPLEMENT, NEPRO CARB STEADY,) LIQD Take 237 mLs by mouth 3 (three) times daily as needed (Supplement). Patient taking differently: Take 237 mLs by mouth 3 (three) times daily.  04/12/17   Sheikh, Omair Latif, DO  pantoprazole (PROTONIX) 40 MG tablet Take 1 tablet (40 mg total) 2 (two) times daily before a meal by mouth. 04/28/17   Lavina Hamman, MD  PARoxetine (  PAXIL) 10 MG tablet Take 1 tablet (10 mg total) daily by mouth. 04/28/17   Lavina Hamman, MD  polyethylene glycol [facility] Floria Raveling) packet Take 17 g daily by mouth. 04/28/17   Lavina Hamman, MD  sucralfate (CARAFATE) 1 GM/10ML suspension Take 10 mLs (1 g total) 4 (four) times daily -  with meals and at bedtime by mouth. Patient taking differently: Take 1 g by mouth 4 (four) times daily. WITH A MEAL OR SNACK 04/28/17   Lavina Hamman, MD  warfarin (COUMADIN) 5 MG tablet Take 1 tablet (5 mg total) by mouth daily at 6 PM. 05/13/17   Patrecia Pour, Christean Grief, MD    Physical Exam: Vitals:   05/31/17 2000 05/31/17 2015 05/31/17 2030 05/31/17 2115  BP: (!) 144/50 (!) 153/50 (!) 152/49 (!) 156/47  Pulse: 78 79  71  Resp:      Temp:      TempSrc:      SpO2: 99% 100%  100%  Weight:      Height:       General: Not in acute distress HEENT:       Eyes: PERRL, EOMI, no scleral icterus.       ENT: No discharge from the ears and  nose, no pharynx injection, no tonsillar enlargement.        Neck: No JVD, no bruit, no mass felt. Heme: No neck lymph node enlargement. Cardiac: S1/S2, RRR, No murmurs, No gallops or rubs. Respiratory: Good air movement bilaterally. No rales, wheezing, rhonchi or rubs. GI: Soft, nondistended, nontender, no rebound pain, no organomegaly, BS present. GU: No hematuria Ext: has trace right leg edema bilaterally. 1+DP/PT pulse bilaterally. Musculoskeletal: No joint deformities, No joint redness or warmth, no limitation of ROM in spin. Skin: has tenderness, swelling and warmth in the left groin area and left upper inner thigh,with some induration in the left upper inner thigh. Neuro: Alert, oriented X3, cranial nerves II-XII grossly intact, moves all extremities normally Psych: Patient is not psychotic, no suicidal or hemocidal ideation.  Labs on Admission: I have personally reviewed following labs and imaging studies  CBC: Recent Labs  Lab 05/31/17 1618  WBC 9.5  NEUTROABS 7.5  HGB 7.0*  HCT 22.1*  MCV 79.5  PLT 161   Basic Metabolic Panel: Recent Labs  Lab 05/31/17 1618  NA 142  K 4.2  CL 107  CO2 24  GLUCOSE 89  BUN 47*  CREATININE 4.99*  CALCIUM 9.0   GFR: Estimated Creatinine Clearance: 10.2 mL/min (A) (by C-G formula based on SCr of 4.99 mg/dL (H)). Liver Function Tests: Recent Labs  Lab 05/31/17 1618  AST 17  ALT 8*  ALKPHOS 77  BILITOT 0.8  PROT 6.6  ALBUMIN 2.9*   No results for input(s): LIPASE, AMYLASE in the last 168 hours. No results for input(s): AMMONIA in the last 168 hours. Coagulation Profile: No results for input(s): INR, PROTIME in the last 168 hours. Cardiac Enzymes: No results for input(s): CKTOTAL, CKMB, CKMBINDEX, TROPONINI in the last 168 hours. BNP (last 3 results) No results for input(s): PROBNP in the last 8760 hours. HbA1C: No results for input(s): HGBA1C in the last 72 hours. CBG: No results for input(s): GLUCAP in the last 168  hours. Lipid Profile: No results for input(s): CHOL, HDL, LDLCALC, TRIG, CHOLHDL, LDLDIRECT in the last 72 hours. Thyroid Function Tests: No results for input(s): TSH, T4TOTAL, FREET4, T3FREE, THYROIDAB in the last 72 hours. Anemia Panel: No results for input(s): VITAMINB12, FOLATE,  FERRITIN, TIBC, IRON, RETICCTPCT in the last 72 hours. Urine analysis:    Component Value Date/Time   COLORURINE YELLOW 04/25/2017 1940   APPEARANCEUR CLEAR 04/25/2017 1940   LABSPEC 1.013 04/25/2017 1940   PHURINE 8.0 04/25/2017 1940   GLUCOSEU NEGATIVE 04/25/2017 1940   HGBUR NEGATIVE 04/25/2017 1940   BILIRUBINUR NEGATIVE 04/25/2017 1940   KETONESUR 20 (A) 04/25/2017 1940   PROTEINUR 100 (A) 04/25/2017 1940   NITRITE NEGATIVE 04/25/2017 1940   LEUKOCYTESUR TRACE (A) 04/25/2017 1940   Sepsis Labs: @LABRCNTIP (procalcitonin:4,lacticidven:4) )No results found for this or any previous visit (from the past 240 hour(s)).   Radiological Exams on Admission: No results found.   EKG: Independently reviewed.  Sinus rhythm, QTC 450, RAD, early R-wave progression   Assessment/Plan Principal Problem:   Left groin pain Active Problems:   Essential hypertension   Chronic diastolic CHF (congestive heart failure) (H[facility])   DVT (deep venous thrombosis) (H[facility])   Anemia due to chronic kidney disease   ESRD (end stage renal disease) on dialysis ([facility])   Left leg swelling   Diabetes mellitus with end stage renal disease (H[facility])   Depression   GERD (gastroesophageal reflux disease)   Protein C deficiency (H[facility])   Left groin pain and Left leg swelling: likely due to hematoma of left thigh. There is warmth in the left upper inner thigh, inidicating possible infection. VVS, Dr. Trula Slade was consulted. He recommended to reverse INR and get dialysis. Possible evacuation of her hematoma tomorrow if  her coagulation profile is appropriate. Pt's INR is 4.0-->3.89. -will admit to tele bed as inpt -highly appreciate Dr.  Stephens Shire recommendations. -give 10 mg of Vk by IV-->check INR at 8:00 AM -hold coumadin -IV ancef -f/u blood culture -prn perocet for pain  Hx of DVT and Protein C deficiency: pt is on coumadin with INR 3.89. Now need to do surgery. -Hold her Coumadin -give 10 mg of Vk by IV-->check INR at 8:00 AM.  -will not do IV heparin bridging now due to hematoma  Anemia due to chronic kidney disease: worsened by hematoma.  hgb dropped drom 9.1 on 05/12/17 to 7.0-->5.0 -will transfuse 2 Units of blood -f/u CBC q6h -INR and PTT  HTN:  -Continue home medications: Coreg, -IV hydralazine prn  Chronic diastolic CHF (congestive heart failure) ([place]): To recur 03/06/17 showed EF of 55-60% with grade 1 diastolic dysfunction. Patient has trace leg edema, no respiratory distress. CHF is compensated. -Volume management per renal   ESRD (end stage renal disease) on dialysis (MWF): missed HD on Wednesday -Left message to renal box for HD -Continue Phoslo, Sensipar, Renvela and Calcitriol  ESRD (end stage renal disease) on dialysis (MWF):  potassium 4.2, bicarbonate of 24, creatinine 4.99, BUN 47, -Left message to renal box for HD  Diabetes mellitus with end stage renal disease ([place]): Last A1c 7.3 on 03/14/17, fairly controled. Patient is taking Lantus and NovoLog at home -will decrease Lantus dose from 6-4 units daily   -SSI  GERD: -Protonix -Pepcid  Depression and anxiety: Stable, no suicidal or homicidal ideations. -Continue home medications  DVT ppx: pt has elevated INR   Code Status: DNR (I discussed with patient, and explained the meaning of CODE STATUS. Patient wants to be DNR) Family Communication: None at bed side.   Disposition Plan:  Anticipate discharge back to previous home environment Consults called:  VVS, Dr. Trula Slade Admission status: Inpatient/tele     Date of Service 05/31/2017    [facility] Pager [phone]  If 7PM-7AM, please contact  night-coverage [email or web address] Password [facility] 05/31/2017, 10:29 PM

## 2017-05-31 NOTE — ED Triage Notes (Signed)
Per EMS, coming from dialysis complaining left leg pain with swelling and peripheral edema.  Hx of DVT.  Lungs clear.  588 systolic, 325 HR, RR 18. Unable to get dialysis today.

## 2017-05-31 NOTE — ED Notes (Signed)
Renal tray ordered

## 2017-05-31 NOTE — ED Notes (Signed)
hospitalist at the bedside 

## 2017-05-31 NOTE — Progress Notes (Signed)
*  PRELIMINARY RESULTS* Vascular Ultrasound Left lower extremity venous duplex has been completed.  Preliminary findings: No evidence of deep vein thrombosis in the visualized veins, extremely limited exam due to patient pain tolerance and body habitus.  Large complex fluid collection in upper left thigh, unable to clearly evaluate due to pain.  Everrett Coombe 05/31/2017, 2:52 PM

## 2017-06-01 ENCOUNTER — Encounter (HOSPITAL_COMMUNITY): Payer: Self-pay | Admitting: Anesthesiology

## 2017-06-01 ENCOUNTER — Encounter (HOSPITAL_COMMUNITY)
Admission: EM | Disposition: A | Discharge status: Home Health | Payer: Self-pay | Source: Home / Self Care | Attending: Internal Medicine

## 2017-06-01 LAB — BASIC METABOLIC PANEL
Anion gap: 7 (ref 5–15)
Anion gap: 6 (ref 5–15)
BUN: 48 mg/dL — ABNORMAL HIGH (ref 6–20)
BUN: 47 mg/dL — ABNORMAL HIGH (ref 6–20)
CO2: 26 mmol/L (ref 22–32)
CO2: 24 mmol/L (ref 22–32)
Calcium: 8.3 mg/dL — ABNORMAL LOW (ref 8.9–10.3)
Calcium: 8.1 mg/dL — ABNORMAL LOW (ref 8.9–10.3)
Chloride: 107 mmol/L (ref 101–111)
Chloride: 109 mmol/L (ref 101–111)
Creatinine, Ser: 4.67 mg/dL — ABNORMAL HIGH (ref 0.44–1.00)
Creatinine, Ser: 4.72 mg/dL — ABNORMAL HIGH (ref 0.44–1.00)
GFR calc Af Amer: 10 mL/min — ABNORMAL LOW (ref >60)
GFR calc Af Amer: 9 mL/min — ABNORMAL LOW (ref >60)
GFR calc non Af Amer: 8 mL/min — ABNORMAL LOW (ref >60)
GFR calc non Af Amer: 8 mL/min — ABNORMAL LOW (ref >60)
Glucose, Bld: 76 mg/dL (ref 65–99)
Glucose, Bld: 207 mg/dL — ABNORMAL HIGH (ref 65–99)
Potassium: 3.7 mmol/L (ref 3.5–5.1)
Potassium: 3.9 mmol/L (ref 3.5–5.1)
Sodium: 140 mmol/L (ref 135–145)
Sodium: 139 mmol/L (ref 135–145)

## 2017-06-01 LAB — GLUCOSE, CAPILLARY
Glucose-Capillary: 63 mg/dL — ABNORMAL LOW (ref 65–99)
Glucose-Capillary: 140 mg/dL — ABNORMAL HIGH (ref 65–99)
Glucose-Capillary: 102 mg/dL — ABNORMAL HIGH (ref 65–99)
Glucose-Capillary: 120 mg/dL — ABNORMAL HIGH (ref 65–99)
Glucose-Capillary: 123 mg/dL — ABNORMAL HIGH (ref 65–99)
Glucose-Capillary: 159 mg/dL — ABNORMAL HIGH (ref 65–99)

## 2017-06-01 LAB — CBC
HCT: 15.5 % — ABNORMAL LOW (ref 36.0–46.0)
HCT: 20.9 % — ABNORMAL LOW (ref 36.0–46.0)
Hemoglobin: 5 g/dL — CL (ref 12.0–15.0)
Hemoglobin: 6.9 g/dL — CL (ref 12.0–15.0)
MCH: 25.1 pg — ABNORMAL LOW (ref 26.0–34.0)
MCH: 26.7 pg (ref 26.0–34.0)
MCHC: 32.3 g/dL (ref 30.0–36.0)
MCHC: 33 g/dL (ref 30.0–36.0)
MCV: 77.9 fL — ABNORMAL LOW (ref 78.0–100.0)
MCV: 81 fL (ref 78.0–100.0)
Platelets: 240 10*3/uL (ref 150–400)
Platelets: 217 10*3/uL (ref 150–400)
RBC: 1.99 MIL/uL — ABNORMAL LOW (ref 3.87–5.11)
RBC: 2.58 MIL/uL — ABNORMAL LOW (ref 3.87–5.11)
RDW: 17 % — ABNORMAL HIGH (ref 11.5–15.5)
RDW: 16.3 % — ABNORMAL HIGH (ref 11.5–15.5)
WBC: 7.6 10*3/uL (ref 4.0–10.5)
WBC: 9.9 10*3/uL (ref 4.0–10.5)

## 2017-06-01 LAB — PROTIME-INR
INR: 1.36
INR: 3.86
INR: 4
Prothrombin Time: 16.7 seconds — ABNORMAL HIGH (ref 11.4–15.2)
Prothrombin Time: 37.6 seconds — ABNORMAL HIGH (ref 11.4–15.2)
Prothrombin Time: 38.7 seconds — ABNORMAL HIGH (ref 11.4–15.2)

## 2017-06-01 LAB — APTT: aPTT: 91 seconds — ABNORMAL HIGH (ref 24–36)

## 2017-06-01 LAB — IRON AND TIBC
Iron: 11 ug/dL — ABNORMAL LOW (ref 28–170)
Saturation Ratios: 8 % — ABNORMAL LOW (ref 10.4–31.8)
TIBC: 141 ug/dL — ABNORMAL LOW (ref 250–450)
UIBC: 130 ug/dL

## 2017-06-01 LAB — FOLATE: Folate: 28 ng/mL (ref >5.9)

## 2017-06-01 LAB — PREPARE RBC (CROSSMATCH)

## 2017-06-01 LAB — VITAMIN B12: Vitamin B-12: 726 pg/mL (ref 180–914)

## 2017-06-01 LAB — PROCALCITONIN: Procalcitonin: 0.16 ng/mL

## 2017-06-01 LAB — ABO/RH: ABO/RH(D): B POS

## 2017-06-01 LAB — MRSA PCR SCREENING: MRSA by PCR: NEGATIVE

## 2017-06-01 LAB — FERRITIN: Ferritin: 647 ng/mL — ABNORMAL HIGH (ref 11–307)

## 2017-06-01 SURGERY — REVISION OF ARTERIOVENOUS GORETEX GRAFT
Anesthesia: Choice | Laterality: Right

## 2017-06-01 MED ORDER — VITAMIN K1 10 MG/ML IJ SOLN
10.0000 mg | Freq: Once | INTRAVENOUS | Status: AC
  Administered 2017-06-01: 10 mg via INTRAVENOUS
  Filled 2017-06-01: qty 1

## 2017-06-01 MED ORDER — PROTAMINE SULFATE 10 MG/ML IV SOLN
INTRAVENOUS | Status: AC
  Filled 2017-06-01: qty 5

## 2017-06-01 MED ORDER — FENTANYL CITRATE (PF) 250 MCG/5ML IJ SOLN
INTRAMUSCULAR | Status: AC
  Filled 2017-06-01: qty 5

## 2017-06-01 MED ORDER — DOXERCALCIFEROL 4 MCG/2ML IV SOLN
4.0000 ug | INTRAVENOUS | Status: DC
  Administered 2017-06-06 – 2017-06-09 (×3): 4 ug via INTRAVENOUS
  Filled 2017-06-01 (×4): qty 2

## 2017-06-01 MED ORDER — SODIUM CHLORIDE 0.9 % IV SOLN: 125.0000 mg | Freq: Once | INTRAVENOUS | Status: DC

## 2017-06-01 MED ORDER — SODIUM CHLORIDE 0.9 % IV BOLUS (SEPSIS)
500.0000 mL | Freq: Once | INTRAVENOUS | Status: AC
  Administered 2017-06-01: 500 mL via INTRAVENOUS

## 2017-06-01 MED ORDER — SODIUM CHLORIDE 0.9 % IV SOLN
Freq: Once | INTRAVENOUS | Status: AC
  Administered 2017-06-01: 11:00:00 via INTRAVENOUS

## 2017-06-01 MED ORDER — MORPHINE SULFATE (PF) 4 MG/ML IV SOLN
1.0000 mg | INTRAVENOUS | Status: DC | PRN
  Administered 2017-06-01 – 2017-06-02 (×5): 1 mg via INTRAVENOUS
  Filled 2017-06-01 (×6): qty 1

## 2017-06-01 MED ORDER — DEXTROSE 50 % IV SOLN
INTRAVENOUS | Status: AC
  Administered 2017-06-01: 50 mL
  Filled 2017-06-01: qty 50

## 2017-06-01 MED ORDER — SODIUM CHLORIDE 0.9 % IV SOLN
Freq: Once | INTRAVENOUS | Status: AC
  Administered 2017-06-09: 14:00:00 via INTRAVENOUS

## 2017-06-01 MED ORDER — SODIUM CHLORIDE 0.9 % IV SOLN
125.0000 mg | INTRAVENOUS | Status: DC
  Filled 2017-06-01 (×2): qty 10

## 2017-06-01 MED ORDER — HYDROMORPHONE HCL 1 MG/ML IJ SOLN
2.0000 mg | Freq: Once | INTRAMUSCULAR | Status: AC
  Administered 2017-06-01: 2 mg via INTRAVENOUS
  Filled 2017-06-01: qty 2

## 2017-06-01 MED ORDER — DARBEPOETIN ALFA 100 MCG/0.5ML IJ SOSY
100.0000 ug | PREFILLED_SYRINGE | INTRAMUSCULAR | Status: DC
  Filled 2017-06-01: qty 0.5

## 2017-06-01 MED ORDER — LIDOCAINE 2% (20 MG/ML) 5 ML SYRINGE
INTRAMUSCULAR | Status: AC
  Filled 2017-06-01: qty 10

## 2017-06-01 NOTE — Progress Notes (Signed)
    Subjective  -   Complaining of pain in her left inner thigh   Physical Exam:  Hematoma surrounding the medial aspect of the dialysis graft with bulla on the skin       Assessment/Plan:   the patient's INR is still supratherapeutic.  ThereforeI will cancel her operation for today.  I discussed with the patient that we will give her plasma today to correct her INR.  I will plan on surgical evacuation of the hematoma tomorrow.  This could potentially require revision of her dialysis graft.  If the graft does appear to be infected, it would need to be removed and a catheter will be placed.  The patient understands this.  She will be n.p.o. after midnight.  Wells Brabham 06/01/2017 2:17 PM --  Vitals:   06/01/17 1136 06/01/17 1200  BP: (!) 153/40   Pulse: 80   Resp: 17   Temp: 98.2 F (36.8 C)   SpO2: 100% 100%    Intake/Output Summary (Last 24 hours) at 06/01/2017 1417 Last data filed at 06/01/2017 1055 Gross per 24 hour  Intake 370 ml  Output 600 ml  Net -230 ml     Laboratory CBC    Component Value Date/Time   WBC 7.6 06/01/2017 0415   HGB 5.0 (LL) 06/01/2017 0415   HCT 15.5 (L) 06/01/2017 0415   PLT 240 06/01/2017 0415    BMET    Component Value Date/Time   NA 140 06/01/2017 0415   K 3.7 06/01/2017 0415   CL 107 06/01/2017 0415   CO2 26 06/01/2017 0415   GLUCOSE 76 06/01/2017 0415   BUN 48 (H) 06/01/2017 0415   CREATININE 4.67 (H) 06/01/2017 0415   CALCIUM 8.3 (L) 06/01/2017 0415   CALCIUM 7.8 (L) 03/13/2017 1559   GFRNONAA 8 (L) 06/01/2017 0415   GFRAA 10 (L) 06/01/2017 0415    COAG Lab Results  Component Value Date   INR 3.86 06/01/2017   INR 3.89 05/31/2017   INR 4.00 05/31/2017   No results found for: PTT  Antibiotics Anti-infectives (From admission, onward)   Start     Dose/Rate Route Frequency Ordered Stop   06/01/17 2200  ceFAZolin (ANCEF) IVPB 1 g/50 mL premix     1 g 100 mL/hr over 30 Minutes Intravenous Every 24 hours  05/31/17 2100     06/01/17 1100  ceFAZolin (ANCEF) IVPB 1 g/50 mL premix  Status:  Discontinued    Comments:  Send with pt to OR   1 g 100 mL/hr over 30 Minutes Intravenous To [facility] 05/31/17 1714 06/01/17 1149   05/31/17 2130  ceFAZolin (ANCEF) IVPB 2g/100 mL premix     2 g 200 mL/hr over 30 Minutes Intravenous  Once 05/31/17 2055 05/31/17 2330       V. Leia Alf, M.D. [facility] Office: [phone] Pager:  [phone]
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
## 2017-06-01 NOTE — Progress Notes (Signed)
Pt with INR of 3.86 this morning.  Will cancel her surgery for today and order 2 units of FFP for coumadin reversal.  Npo order changed to tomorrow and diet ordered.   Dr. Trula Slade to see pt later today.   Leontine Locket, [facility] 06/01/2017 10:15 AM

## 2017-06-01 NOTE — Progress Notes (Addendum)
Call Time 2112  Called by charge RN, concerned that patient might be experiencing compartment syndrome of the left lower leg, + hematoma that surrounds HD AV graft.  [facility] is following the patient, plan for OR for evacuation once INR is normal, patient received 2U PRBCS and 2U FFP which finished around 2100.    I was with another patient and arrived as soon as I could at 2225. Per RNs, patient is having acute severe pain from LLE, RNs were able to doppler pulses and B/T are present, leg is warm to touch and even the slightest touch causes that patient severe pain. After leg was elevated, sensation in LLE improved and I did have the RNs measure circumference around thigh as well (64). VSS and patient is neuro intact  RNs had already spoken to [facility] MD on call and patient did receive 1 time dose of 2mg  Dilaudid at 2110 and Morphine 1 mg at 2032 (ordered Q4H PRN).   I paged [facility] MD @ 2229 about patient's condition, pain management, and that post transfusions labs were drawn.   Awaiting return call  End Time 2250  I called charge RN at 2344 and asked if MD had called back, RN stated MD did call 52M RN back and plan to use oxycodone and morphine for pain relief for now.

## 2017-06-01 NOTE — Progress Notes (Signed)
PROGRESS NOTE    Patient: Ashlee Miles     PCP: Darlina Rumpf, MD                    DOB: 04-12-1941            DOA: 05/31/2017 YQI:[id number]             DOS: 06/01/2017, 12:13 PM   Date of Service: the patient was seen and examined on 06/01/2017 Subjective:  Patient was seen and examined this morning.  Awake alert oriented, complaining of extensive pain and discomfort in the left groin area.  Currently getting blood transfusion, vitals stable. No active signs of bleeding,  ----------------------------------------------------------------------------------------------------------------------  Brief Narrative:  Ashlee Miles is a 76 y.o. female with medical history significant of ESRD-HD (MWF), hypertension, diabetes mellitus, DVT, protein C deficiency on Coumadin, pituitary adenoma, dCHF, GERD, depression, who presents with left groin pain and left leg swelling and pain.  She was recently hospitalized from 11/20-11/23 due to left groin pain secondary to pseudoaneurysm from AV fistulaand Hematoma.  Pt states that she continues to have pain in the left groin area and left upper inner thigh. She also has left leg swelling and the pain. The pain is constant, 10 out of 10 in severity, sharp, nonradiating. She states that the left upper inner thigh and groin area are warm. No fever or chills.   Found to have WBC 9.5, lactic acid 1.74, pending INR, potassium 4.2, bicarbonate of 24, creatinine 4.99, BUN 47, temperature 99.1, heart rate is 75, respiration rate 18, oxygen sat 96% on room air. Left lower extremity venous Doppler was done, which is limited study, but was negative for DVT. -----------------------------------------------------------------------------------------------------------------------------  Left groin pain and Left leg swelling -large left thigh hematoma, warm to touch, tender Ruling out infection, continue antibiotics, as needed analgesics  . VVS, Dr. Trula Slade, status post  attempting of reversing INR, 10 mg of IV vitamin K was given, INR this morning still elevated at 3.89 Vascular team was planning for evacuation of hematoma/blood clot, therefore canceled the procedure due to elevated INR, recommended FFP, Will plan for evaluation in a.m.  possible evacuation of her hematoma tomorrow if her coagulation profile is appropriate. Pt's INR is 4.0-->3.89. . -give 10 mg of Vk by IV-->check INR  -hold coumadin -IV ancef -f/u blood culture -prn perocet for pain  Hx of DVT and Protein C deficiency: pt is on coumadin with INR 3.89. Now need to do surgery. -Hold her Coumadin -give 10 mg of Vk by IV, still elevated INR, Planning for 2 units of blood transfusion followed by 2 units of FFP, -will not do IV heparin bridging now due to hematoma  Anemia due to chronic kidney disease: worsened by hematoma.  hgb dropped drom 9.1 on 05/12/17 to 7.0-->5.0 -  Currently on first units of blood transfusion planning for total of 2 units, recheck an H&H, and 2 units of FFP -f/u CBC q6h -INR and PTT  Iron study also is revealing severe iron deficiency, will hold any iron supplements at this time due to possible infection  HTN:  -Continue home medications: Coreg, -IV hydralazine prn  Chronic diastolic CHF (congestive heart failure) ([place]): To recur 03/06/17 showed EF of 55-60% with grade 1 diastolic dysfunction. Patient has trace leg edema, no respiratory distress. CHF is compensated. -Volume management per renal  ESRD (end stage renal disease) on dialysis (MWF): missed HD on Wednesday -Left message to renal box for HD -Continue Phoslo, Sensipar, Renvela and  Calcitriol  ESRD (end stage renal disease) on dialysis (MWF):  potassium 4.2, bicarbonate of 24, creatinine 4.99, BUN 47, -Left message to renal box for HD  Diabetes mellitus with end stage renal disease ([place]): Last A1c 7.3 on 03/14/17, fairly controled. Patient is taking Lantus and NovoLog at home -will decrease  Lantus dose from 6-4 units daily   -SSI  GERD: -Protonix -Pepcid  Depression and anxiety: Stable, no suicidal or homicidal ideations. -Continue home medications  DVT ppx: on IV Heparin   Code Status: DNR  Family Communication: None at bed side.   Disposition Plan:  Anticipate discharge back to previous home environment Consults called:  VVS, Dr. Trula Slade Admission status: Inpatient/tele     Family Communication:  The above findings and plan of care has been discussed with patient and she expressed understanding and agreement of above.   Disposition Plan:   > 3 days   Procedures:   Per vascular team planning for the claudication, hematoma evacuation of the left groin possibly  On 06/02/2017  Antimicrobials:  Anti-infectives (From admission, onward)   Start     Dose/Rate Route Frequency Ordered Stop   06/01/17 2200  ceFAZolin (ANCEF) IVPB 1 g/50 mL premix     1 g 100 mL/hr over 30 Minutes Intravenous Every 24 hours 05/31/17 2100     06/01/17 1100  ceFAZolin (ANCEF) IVPB 1 g/50 mL premix  Status:  Discontinued    Comments:  Send with pt to OR   1 g 100 mL/hr over 30 Minutes Intravenous To ShortStay Surgical 05/31/17 1714 06/01/17 1149   05/31/17 2130  ceFAZolin (ANCEF) IVPB 2g/100 mL premix     2 g 200 mL/hr over 30 Minutes Intravenous  Once 05/31/17 2055 05/31/17 2330       Objective: Vitals:   06/01/17 1105 06/01/17 1106 06/01/17 1136 06/01/17 1200  BP: (!) 146/45 (!) 146/45 (!) 153/40   Pulse: 79 79 80   Resp: 16 16 17    Temp: 98.6 F (37 C) 98.6 F (37 C) 98.2 F (36.8 C)   TempSrc: Oral Oral Oral   SpO2: 99% 99% 100% 100%  Weight:      Height:        Intake/Output Summary (Last 24 hours) at 06/01/2017 1213 Last data filed at 06/01/2017 1055 Gross per 24 hour  Intake 370 ml  Output 600 ml  Net -230 ml   Filed Weights   05/31/17 1405 06/01/17 0621  Weight: 97.1 kg (214 lb) 97.2 kg (214 lb 4.6 oz)    Examination:  General exam: Appears  calm, complaining of pain and discomfort in the left leg. Respiratory system: Clear to auscultation. Respiratory effort normal. Cardiovascular system: S1 & S2 heard, RRR. No JVD, murmurs, rubs, gallops or clicks. No pedal edema. Gastrointestinal system: Abdomen is nondistended, soft and nontender. No organomegaly or masses felt. Normal bowel sounds heard. Central nervous system: Alert and oriented. No focal neurological deficits. Extremities: Left groin: Extensive erythema, edema, tenderness, Positive pulses, limited range of motion in her left leg secondary to extensive edema erythema and tenderness, positive pulses in all other extremities negative for any other edema or erythema Skin: Left upper thigh positive for edema erythema, tenderness, negative for any open wounds, negative for any other rashes Presence of Port-A-Cath right chest area Psychiatry: Judgement and insight appear normal. Mood & affect appropriate.     Data Reviewed: I have personally reviewed following labs and imaging studies  CBC: Recent Labs  Lab 05/31/17 1618 06/01/17 0415  WBC 9.5 7.6  NEUTROABS 7.5  --   HGB 7.0* 5.0*  HCT 22.1* 15.5*  MCV 79.5 77.9*  PLT 322 580   Basic Metabolic Panel: Recent Labs  Lab 05/31/17 1618 06/01/17 0415  NA 142 140  K 4.2 3.7  CL 107 107  CO2 24 26  GLUCOSE 89 76  BUN 47* 48*  CREATININE 4.99* 4.67*  CALCIUM 9.0 8.3*   GFR: Estimated Creatinine Clearance: 10.9 mL/min (A) (by C-G formula based on SCr of 4.67 mg/dL (H)). Liver Function Tests: Recent Labs  Lab 05/31/17 1618  AST 17  ALT 8*  ALKPHOS 77  BILITOT 0.8  PROT 6.6  ALBUMIN 2.9*   No results for input(s): LIPASE, AMYLASE in the last 168 hours. No results for input(s): AMMONIA in the last 168 hours. Coagulation Profile: Recent Labs  Lab 05/31/17 0012 05/31/17 2306 06/01/17 0415  INR 4.00 3.89 3.86   Cardiac Enzymes: No results for input(s): CKTOTAL, CKMB, CKMBINDEX, TROPONINI in the last 168  hours. BNP (last 3 results) No results for input(s): PROBNP in the last 8760 hours. HbA1C: No results for input(s): HGBA1C in the last 72 hours. CBG: Recent Labs  Lab 05/31/17 2159 06/01/17 0816 06/01/17 0919  GLUCAP 100* 63* 140*   Lipid Profile: No results for input(s): CHOL, HDL, LDLCALC, TRIG, CHOLHDL, LDLDIRECT in the last 72 hours. Thyroid Function Tests: No results for input(s): TSH, T4TOTAL, FREET4, T3FREE, THYROIDAB in the last 72 hours. Anemia Panel: Recent Labs    05/31/17 2306  VITAMINB12 726  FOLATE 28.0  FERRITIN 647*  TIBC 141*  IRON 11*  RETICCTPCT 2.1   Sepsis Labs: Recent Labs  Lab 05/31/17 0012 05/31/17 1631  PROCALCITON 0.16  --   LATICACIDVEN  --  1.74    No results found for this or any previous visit (from the past 240 hour(s)).     Radiology Studies: No results found.  Scheduled Meds: . carvedilol  6.25 mg Oral BID WC  . [START ON 06/02/2017] darbepoetin (ARANESP) injection - DIALYSIS  100 mcg Intravenous Q Fri-HD  . docusate sodium  100 mg Oral BID  . [START ON 06/02/2017] doxercalciferol  4 mcg Intravenous Q M,W,F-HD  . gabapentin  300 mg Oral TID  . insulin aspart  0-5 Units Subcutaneous QHS  . insulin aspart  0-9 Units Subcutaneous TID WC  . insulin glargine  3 Units Subcutaneous QHS  . [START ON 06/02/2017] midodrine  10 mg Oral Q M,W,F-HD  . multivitamin  1 tablet Oral QHS  . pantoprazole  40 mg Oral Daily  . PARoxetine  10 mg Oral Daily  . simvastatin  40 mg Oral q1800  . sucralfate  1 g Oral TID WC & HS   Continuous Infusions: . sodium chloride    .  ceFAZolin (ANCEF) IV    . [START ON 06/02/2017] ferric gluconate (FERRLECIT/NULECIT) IV       LOS: 1 day    Time spent: >25 minutes   Deatra James, MD [facility] Pager [phone]  If 7PM-7AM, please contact night-coverage [email or web address] Password [email or web address] 06/01/2017, 12:13 PM

## 2017-06-01 NOTE — Progress Notes (Signed)
CRITICAL VALUE ALERT  Critical Value:  Hemoglobin 5.0  Date & Time Notied:  06/01/17 0449  Provider Notified: K. Schorr,NP,Niu,Xilin,MD @12 /13/18 0457  Orders Received/Actions taken: Orders received via epic

## 2017-06-01 NOTE — Progress Notes (Signed)
[facility]  Patient Status: Active (receiving services up to time of hospitalization)  [facility] is providing the following services: RN and PT  If patient discharges after hours, please call [phone].   Ashlee Miles 06/01/2017, 10:50 AM

## 2017-06-01 NOTE — H&P (View-Only) (Signed)
    Subjective  -   Complaining of pain in her left inner thigh   Physical Exam:  Hematoma surrounding the medial aspect of the dialysis graft with bulla on the skin       Assessment/Plan:   the patient's INR is still supratherapeutic.  ThereforeI will cancel her operation for today.  I discussed with the patient that we will give her plasma today to correct her INR.  I will plan on surgical evacuation of the hematoma tomorrow.  This could potentially require revision of her dialysis graft.  If the graft does appear to be infected, it would need to be removed and a catheter will be placed.  The patient understands this.  She will be n.p.o. after midnight.  Ashlee Miles 06/01/2017 2:17 PM --  Vitals:   06/01/17 1136 06/01/17 1200  BP: (!) 153/40   Pulse: 80   Resp: 17   Temp: 98.2 F (36.8 C)   SpO2: 100% 100%    Intake/Output Summary (Last 24 hours) at 06/01/2017 1417 Last data filed at 06/01/2017 1055 Gross per 24 hour  Intake 370 ml  Output 600 ml  Net -230 ml     Laboratory CBC    Component Value Date/Time   WBC 7.6 06/01/2017 0415   HGB 5.0 (LL) 06/01/2017 0415   HCT 15.5 (L) 06/01/2017 0415   PLT 240 06/01/2017 0415    BMET    Component Value Date/Time   NA 140 06/01/2017 0415   K 3.7 06/01/2017 0415   CL 107 06/01/2017 0415   CO2 26 06/01/2017 0415   GLUCOSE 76 06/01/2017 0415   BUN 48 (H) 06/01/2017 0415   CREATININE 4.67 (H) 06/01/2017 0415   CALCIUM 8.3 (L) 06/01/2017 0415   CALCIUM 7.8 (L) 03/13/2017 1559   GFRNONAA 8 (L) 06/01/2017 0415   GFRAA 10 (L) 06/01/2017 0415    COAG Lab Results  Component Value Date   INR 3.86 06/01/2017   INR 3.89 05/31/2017   INR 4.00 05/31/2017   No results found for: PTT  Antibiotics Anti-infectives (From admission, onward)   Start     Dose/Rate Route Frequency Ordered Stop   06/01/17 2200  ceFAZolin (ANCEF) IVPB 1 g/50 mL premix     1 g 100 mL/hr over 30 Minutes Intravenous Every 24 hours  05/31/17 2100     06/01/17 1100  ceFAZolin (ANCEF) IVPB 1 g/50 mL premix  Status:  Discontinued    Comments:  Send with pt to OR   1 g 100 mL/hr over 30 Minutes Intravenous To ShortStay Surgical 05/31/17 1714 06/01/17 1149   05/31/17 2130  ceFAZolin (ANCEF) IVPB 2g/100 mL premix     2 g 200 mL/hr over 30 Minutes Intravenous  Once 05/31/17 2055 05/31/17 2330       V. Leia Alf, M.D. [facility] Office: [phone] Pager:  [phone]

## 2017-06-01 NOTE — Progress Notes (Signed)
Pt having 10/10 pain in left groin area after receiving percocet 5-325 MG PO Q6H PRN for moderate pain at 1738. I paged Dr. Hal Hope and was given a verbal order for Morphine 1 MG IV Q4H PRN for severe pain. I repeated the order back to him and was asked to put the order in as given. I placed the order and gave the the medication as ordered.

## 2017-06-02 ENCOUNTER — Encounter (HOSPITAL_COMMUNITY)
Admission: EM | Disposition: A | Discharge status: Home Health | Payer: Self-pay | Source: Home / Self Care | Attending: Internal Medicine

## 2017-06-02 ENCOUNTER — Inpatient Hospital Stay (HOSPITAL_COMMUNITY): Payer: 59 | Admitting: Certified Registered Nurse Anesthetist

## 2017-06-02 ENCOUNTER — Inpatient Hospital Stay (HOSPITAL_COMMUNITY): Payer: 59

## 2017-06-02 ENCOUNTER — Encounter (HOSPITAL_COMMUNITY): Payer: Self-pay | Admitting: Certified Registered Nurse Anesthetist

## 2017-06-02 DIAGNOSIS — T82868S Thrombosis of vascular prosthetic devices, implants and grafts, sequela: Secondary | ICD-10-CM

## 2017-06-02 HISTORY — PX: THROMBECTOMY AND REVISION OF ARTERIOVENTOUS (AV) GORETEX  GRAFT: SHX6120

## 2017-06-02 LAB — PREPARE FRESH FROZEN PLASMA
Unit division: 0
Unit division: 0

## 2017-06-02 LAB — BPAM FFP
Blood Product Expiration Date: 201812182359
Blood Product Expiration Date: 201812182359
ISSUE DATE / TIME: 201812131508
ISSUE DATE / TIME: 201812131754
Unit Type and Rh: 7300
Unit Type and Rh: 7300

## 2017-06-02 LAB — GLUCOSE, CAPILLARY
Glucose-Capillary: 278 mg/dL — ABNORMAL HIGH (ref 65–99)
Glucose-Capillary: 213 mg/dL — ABNORMAL HIGH (ref 65–99)
Glucose-Capillary: 240 mg/dL — ABNORMAL HIGH (ref 65–99)
Glucose-Capillary: 106 mg/dL — ABNORMAL HIGH (ref 65–99)
Glucose-Capillary: 75 mg/dL (ref 65–99)
Glucose-Capillary: 103 mg/dL — ABNORMAL HIGH (ref 65–99)

## 2017-06-02 LAB — POCT I-STAT 4, (NA,K, GLUC, HGB,HCT)
Glucose, Bld: 80 mg/dL (ref 65–99)
HCT: 24 % — ABNORMAL LOW (ref 36.0–46.0)
Hemoglobin: 8.2 g/dL — ABNORMAL LOW (ref 12.0–15.0)
Potassium: 4 mmol/L (ref 3.5–5.1)
Sodium: 145 mmol/L (ref 135–145)

## 2017-06-02 LAB — CBC
HCT: 33.4 % — ABNORMAL LOW (ref 36.0–46.0)
Hemoglobin: 12.1 g/dL (ref 12.0–15.0)
MCH: 29.7 pg (ref 26.0–34.0)
MCHC: 36.2 g/dL — ABNORMAL HIGH (ref 30.0–36.0)
MCV: 82.1 fL (ref 78.0–100.0)
Platelets: 197 10*3/uL (ref 150–400)
RBC: 4.07 MIL/uL (ref 3.87–5.11)
RDW: 15 % (ref 11.5–15.5)
WBC: 12.1 10*3/uL — ABNORMAL HIGH (ref 4.0–10.5)

## 2017-06-02 LAB — HEMOGLOBIN AND HEMATOCRIT, BLOOD
HCT: 16.7 % — ABNORMAL LOW (ref 36.0–46.0)
Hemoglobin: 5.5 g/dL — CL (ref 12.0–15.0)

## 2017-06-02 LAB — PREPARE RBC (CROSSMATCH)

## 2017-06-02 SURGERY — THROMBECTOMY AND REVISION OF ARTERIOVENTOUS (AV) GORETEX  GRAFT
Anesthesia: General | Laterality: Left

## 2017-06-02 MED ORDER — GLYCOPYRROLATE 0.2 MG/ML IJ SOLN
INTRAMUSCULAR | Status: DC | PRN
  Administered 2017-06-02: .5 mg via INTRAVENOUS

## 2017-06-02 MED ORDER — HEPARIN SODIUM (PORCINE) 5000 UNIT/ML IJ SOLN
INTRAMUSCULAR | Status: DC | PRN
  Administered 2017-06-02: 17:00:00 500 mL

## 2017-06-02 MED ORDER — FENTANYL CITRATE (PF) 250 MCG/5ML IJ SOLN
INTRAMUSCULAR | Status: AC
  Filled 2017-06-02: qty 5

## 2017-06-02 MED ORDER — ROCURONIUM BROMIDE 10 MG/ML (PF) SYRINGE
PREFILLED_SYRINGE | INTRAVENOUS | Status: DC | PRN
  Administered 2017-06-02: 30 mg via INTRAVENOUS

## 2017-06-02 MED ORDER — LIDOCAINE-EPINEPHRINE (PF) 1 %-1:200000 IJ SOLN
INTRAMUSCULAR | Status: AC
  Filled 2017-06-02: qty 30

## 2017-06-02 MED ORDER — SODIUM CHLORIDE 0.9 % IV SOLN
INTRAVENOUS | Status: DC | PRN
  Administered 2017-06-02: 17:00:00 via INTRAVENOUS

## 2017-06-02 MED ORDER — PROPOFOL 10 MG/ML IV BOLUS
INTRAVENOUS | Status: AC
  Filled 2017-06-02: qty 20

## 2017-06-02 MED ORDER — LIDOCAINE-EPINEPHRINE 1 %-1:100000 IJ SOLN
INTRAMUSCULAR | Status: AC
  Filled 2017-06-02: qty 1

## 2017-06-02 MED ORDER — PHENYLEPHRINE 40 MCG/ML (10ML) SYRINGE FOR IV PUSH (FOR BLOOD PRESSURE SUPPORT)
PREFILLED_SYRINGE | INTRAVENOUS | Status: DC | PRN
  Administered 2017-06-02: 80 ug via INTRAVENOUS

## 2017-06-02 MED ORDER — ONDANSETRON HCL 4 MG/2ML IJ SOLN
INTRAMUSCULAR | Status: DC | PRN
  Administered 2017-06-02: 4 mg via INTRAVENOUS

## 2017-06-02 MED ORDER — FENTANYL CITRATE (PF) 100 MCG/2ML IJ SOLN: 25.0000 ug | INTRAMUSCULAR | Status: DC | PRN

## 2017-06-02 MED ORDER — CEFAZOLIN SODIUM-DEXTROSE 2-4 GM/100ML-% IV SOLN: 2.0000 g | Freq: Once | INTRAVENOUS | Status: DC

## 2017-06-02 MED ORDER — VANCOMYCIN HCL 1000 MG IV SOLR
INTRAVENOUS | Status: AC
  Filled 2017-06-02: qty 1000

## 2017-06-02 MED ORDER — MORPHINE SULFATE (PF) 4 MG/ML IV SOLN
4.0000 mg | INTRAVENOUS | Status: DC | PRN
  Administered 2017-06-03 – 2017-06-05 (×11): 4 mg via INTRAVENOUS
  Filled 2017-06-02 (×12): qty 1

## 2017-06-02 MED ORDER — SODIUM CHLORIDE 0.9 % IV SOLN
Freq: Once | INTRAVENOUS | Status: AC
  Administered 2017-06-02: 10:00:00 via INTRAVENOUS

## 2017-06-02 MED ORDER — PROPOFOL 10 MG/ML IV BOLUS
INTRAVENOUS | Status: DC | PRN
  Administered 2017-06-02: 40 mg via INTRAVENOUS
  Administered 2017-06-02: 80 mg via INTRAVENOUS

## 2017-06-02 MED ORDER — VANCOMYCIN HCL 1000 MG IV SOLR
INTRAVENOUS | Status: DC | PRN
  Administered 2017-06-02: 1000 mg

## 2017-06-02 MED ORDER — NEOSTIGMINE METHYLSULFATE 10 MG/10ML IV SOLN
INTRAVENOUS | Status: DC | PRN
  Administered 2017-06-02: 4 mg via INTRAVENOUS

## 2017-06-02 MED ORDER — EPHEDRINE SULFATE 50 MG/ML IJ SOLN
INTRAMUSCULAR | Status: DC | PRN
  Administered 2017-06-02: 10 mg via INTRAVENOUS

## 2017-06-02 MED ORDER — SODIUM CHLORIDE 0.9 % IV SOLN: 10.0000 mL/h | Freq: Once | INTRAVENOUS | Status: DC

## 2017-06-02 MED ORDER — PHENYLEPHRINE HCL 10 MG/ML IJ SOLN
INTRAVENOUS | Status: DC | PRN
  Administered 2017-06-02: 10 ug/min via INTRAVENOUS

## 2017-06-02 MED ORDER — LIDOCAINE 2% (20 MG/ML) 5 ML SYRINGE
INTRAMUSCULAR | Status: DC | PRN
  Administered 2017-06-02: 40 mg via INTRAVENOUS

## 2017-06-02 MED ORDER — 0.9 % SODIUM CHLORIDE (POUR BTL) OPTIME
TOPICAL | Status: DC | PRN
  Administered 2017-06-02 (×2): 1000 mL

## 2017-06-02 MED ORDER — FENTANYL CITRATE (PF) 100 MCG/2ML IJ SOLN
INTRAMUSCULAR | Status: DC | PRN
  Administered 2017-06-02: 50 ug via INTRAVENOUS

## 2017-06-02 SURGICAL SUPPLY — 39 items
ARMBAND PINK RESTRICT EXTREMIT (MISCELLANEOUS) IMPLANT
CANISTER SUCT 3000ML PPV (MISCELLANEOUS) ×2 IMPLANT
CATH EMB 4FR 80CM (CATHETERS) ×2 IMPLANT
CLIP VESOCCLUDE MED 6/CT (CLIP) ×2 IMPLANT
CLIP VESOCCLUDE SM WIDE 6/CT (CLIP) ×2 IMPLANT
DERMABOND ADVANCED (GAUZE/BANDAGES/DRESSINGS) ×1
DERMABOND ADVANCED .7 DNX12 (GAUZE/BANDAGES/DRESSINGS) ×1 IMPLANT
DRSG VAC ATS MED SENSATRAC (GAUZE/BANDAGES/DRESSINGS) ×2 IMPLANT
ELECT REM PT RETURN 9FT ADLT (ELECTROSURGICAL) ×2
ELECTRODE REM PT RTRN 9FT ADLT (ELECTROSURGICAL) ×1 IMPLANT
GAUZE SPONGE 2X2 8PLY STRL LF (GAUZE/BANDAGES/DRESSINGS) ×1 IMPLANT
GLOVE BIO SURGEON STRL SZ7 (GLOVE) ×2 IMPLANT
GLOVE BIOGEL M 7.0 STRL (GLOVE) ×2 IMPLANT
GLOVE BIOGEL PI IND STRL 7.5 (GLOVE) ×1 IMPLANT
GLOVE BIOGEL PI INDICATOR 7.5 (GLOVE) ×1
GLOVE SURG SS PI 7.5 STRL IVOR (GLOVE) ×2 IMPLANT
GOWN STRL REUS W/ TWL LRG LVL3 (GOWN DISPOSABLE) ×3 IMPLANT
GOWN STRL REUS W/ TWL XL LVL3 (GOWN DISPOSABLE) ×1 IMPLANT
GOWN STRL REUS W/TWL LRG LVL3 (GOWN DISPOSABLE) ×3
GOWN STRL REUS W/TWL XL LVL3 (GOWN DISPOSABLE) ×1
HEMOSTAT SNOW SURGICEL 2X4 (HEMOSTASIS) IMPLANT
KIT BASIN OR (CUSTOM PROCEDURE TRAY) ×2 IMPLANT
KIT ROOM TURNOVER OR (KITS) ×2 IMPLANT
KIT STIMULAN RAPID CURE  10CC (Orthopedic Implant) ×1 IMPLANT
KIT STIMULAN RAPID CURE 10CC (Orthopedic Implant) ×1 IMPLANT
NS IRRIG 1000ML POUR BTL (IV SOLUTION) ×4 IMPLANT
PACK CV ACCESS (CUSTOM PROCEDURE TRAY) ×2 IMPLANT
PAD ARMBOARD 7.5X6 YLW CONV (MISCELLANEOUS) ×4 IMPLANT
SPONGE GAUZE 2X2 STER 10/PKG (GAUZE/BANDAGES/DRESSINGS) ×1
SPONGE LAP 18X18 X RAY DECT (DISPOSABLE) ×2 IMPLANT
SUT PROLENE 6 0 BV (SUTURE) ×2 IMPLANT
SUT VIC AB 3-0 SH 27 (SUTURE) ×1
SUT VIC AB 3-0 SH 27X BRD (SUTURE) ×1 IMPLANT
SUT VICRYL 4-0 PS2 18IN ABS (SUTURE) IMPLANT
TOWEL GREEN STERILE (TOWEL DISPOSABLE) ×2 IMPLANT
UNDERPAD 30X30 (UNDERPADS AND DIAPERS) ×2 IMPLANT
WATER STERILE IRR 1000ML POUR (IV SOLUTION) ×2 IMPLANT
WND VAC CANISTER 500ML (MISCELLANEOUS) ×2 IMPLANT
YANKAUER SUCT BULB TIP NO VENT (SUCTIONS) ×2 IMPLANT

## 2017-06-02 NOTE — Progress Notes (Signed)
[facility] Progress Note  Subjective: severe pain in L leg overnight  Vitals:   06/02/17 0741 06/02/17 0800 06/02/17 1001 06/02/17 1016  BP: (!) 106/50 (!) 105/57 108/76 (!) 120/45  Pulse: 67  73 68  Resp: 18 (!) 8 10 11   Temp:   97.7 F (36.5 C) 98 F (36.7 C)  TempSrc:   Oral Oral  SpO2: 100%  100% 100%  Weight:      Height:        Inpatient medications: . carvedilol  6.25 mg Oral BID WC  . darbepoetin (ARANESP) injection - DIALYSIS  100 mcg Intravenous Q Fri-HD  . docusate sodium  100 mg Oral BID  . doxercalciferol  4 mcg Intravenous Q M,W,F-HD  . gabapentin  300 mg Oral TID  . insulin aspart  0-5 Units Subcutaneous QHS  . insulin aspart  0-9 Units Subcutaneous TID WC  . insulin glargine  3 Units Subcutaneous QHS  . midodrine  10 mg Oral Q M,W,F-HD  . multivitamin  1 tablet Oral QHS  . pantoprazole  40 mg Oral Daily  . PARoxetine  10 mg Oral Daily  . simvastatin  40 mg Oral q1800  . sucralfate  1 g Oral TID WC & HS   . sodium chloride    .  ceFAZolin (ANCEF) IV Stopped (06/02/17 0103)  . ferric gluconate (FERRLECIT/NULECIT) IV     acetaminophen, camphor-menthol, feeding supplement (NEPRO CARB STEADY), guaiFENesin, hydrALAZINE, hydrOXYzine, linaclotide, methocarbamol, morphine injection, ondansetron **OR** ondansetron (ZOFRAN) IV, oxyCODONE-acetaminophen, senna-docusate, sodium chloride flush, triamcinolone cream, zolpidem  Exam: Obese AAF, no distress No jvd Chest clear  RRR no mrg ABd soft obese ntnd Ext L thigh marked edema, very tender. No RLE edema.  NF, ox 3   L thigh AVG +bruit   Home meds:  -lantus/ novolog -midodrine 10 mg pre HD MWF -coumadin/ PPI/ H2B/ carafate 1gm qid/ statin -paxil/ oxy IR/ neurontin 300 tid/ norco prn/  -coreg 6.25 bid -prn miralax/ flexeril/ colace/ atarax/ linzess/ robaxin/ duoneb    Dialysis: [facility] MWF 4h   3K/2.25 bath   98.5kg   Hep 3000   L thigh AVG -hect 4  -Fe thru 12/14  -mircera 150 last dose  11/2      Impression: 1. L thigh graft bleed / hematoma - severe pain, INR now corrected. Per VVS 2. ESRD - HD MWF.  Will need HD later today or tomorrow. 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 Volume - stable 7. Chronic pain syndrome   Plan - HD later today or tomorrow   [name] MD [facility] pager [phone]   06/02/2017, 11:11 AM   Recent Labs  Lab 05/31/17 1618 06/01/17 0415 06/01/17 2226  NA 142 140 139  K 4.2 3.7 3.9  CL 107 107 109  CO2 24 26 24   GLUCOSE 89 76 207*  BUN 47* 48* 47*  CREATININE 4.99* 4.67* 4.72*  CALCIUM 9.0 8.3* 8.1*   Recent Labs  Lab 05/31/17 1618  AST 17  ALT 8*  ALKPHOS 77  BILITOT 0.8  PROT 6.6  ALBUMIN 2.9*   Recent Labs  Lab 05/31/17 1618 06/01/17 0415 06/01/17 2226 06/02/17 0825  WBC 9.5 7.6 9.9  --   NEUTROABS 7.5  --   --   --   HGB 7.0* 5.0* 6.9*  5.5*  HCT 22.1* 15.5* 20.9* 16.7*  MCV 79.5 77.9* 81.0  --   PLT 322 240 217  --    Iron/TIBC/Ferritin/ %Sat    Component Value Date/Time   IRON 11 (L) 05/31/2017 2306   TIBC 141 (L) 05/31/2017 2306   FERRITIN 647 (H) 05/31/2017 2306   IRONPCTSAT 8 (L) 05/31/2017 2306

## 2017-06-02 NOTE — Plan of Care (Signed)
The patient has had continued severe pain unrelieved by any medical intervention so far. This is an issue that will need to be addressed on a continuous basis in relation to the patient current health status.

## 2017-06-02 NOTE — Progress Notes (Signed)
At 2051 the pt was continuing to have 10/10 pain in the left groin and thigh. The pt was writhing around, crying and yelling out in agony. I placed another page to on call MD Hal Hope about pt's continued pain. On call MD placed an order for dilaudid 2 mg IV once and for a STAT INR. Dr. Hal Hope believed that the pt may need to go to surgery tonight if their pain doesn't subside.  The dilaudid was administered at 2110 and the medication provided moderate relief for the patient.  After elevating the pt's leg, an assessment was performed by this RN in addition to the [employer]. The assessment showed that the pt had decreased sensation in the sole of their foot, as well as their calf. Pedal pulses and popliteal pulses on the left leg were found via doppler. The pt's skin around the site was extremely tight and hard to the touch. Cap refill was less than three seconds but a little sluggish. The extremity was warm to the touch and the AV graft site was very warm. The AV site was positive for bruit and thrill. This assessment was consistent with the shift assessment performed at approx 2000.  Elevating the leg slightly increased sensation to the pt's left sole and calf  At 2100, Charge RN Gerald Stabs called RRT to inform them of the patient and what was going on. RRT was dealing with another pt and said that they would be by to see this pt when they were next available.   At 2225 RRT RN Nicholes Rough came to see this pt. The pt's leg circumference was measured and found to be 64 cm. RRT contacted Dr. Hal Hope at 2229 in referance to the STAT INR being drawn. The pt had FFP finish at 2015 so labs were not able to be drawn until 2215.    The 0000 assessment of the pt's leg circumference showed a minor improvement at 63 cm.  Instructions from both RRT and Dr. Hal Hope include to continue to monitor and assess the patient for any decompensation in the extremity, loss of pulses, increased pain,  discoloration, or any other acute changes. I was instructed to call RRT and Dr. Hal Hope if any changes occur.   I will continue to monitor the patient closely for any changes.

## 2017-06-02 NOTE — Transfer of Care (Signed)
Immediate Anesthesia Transfer of Care Note  Patient: Ashlee Miles  Procedure(s) Performed: EVACUATION OF LEFT THIGH HEMATOMA  AND APPLICATION OF WOUND VAC (Left )  Patient Location: PACU  Anesthesia Type:General  Level of Consciousness: awake, alert , oriented and patient cooperative  Airway & Oxygen Therapy: Patient Spontanous Breathing and Patient connected to face mask oxygen  Post-op Assessment: Report given to RN and Post -op Vital signs reviewed and stable  Post vital signs: Reviewed and stable  Last Vitals:  Vitals:   06/02/17 1818 06/02/17 1822  BP:  (!) 143/67  Pulse:  76  Resp:  (!) 22  Temp: (!) 36.3 C   SpO2:  100%    Last Pain:  Vitals:   06/02/17 1818  TempSrc:   PainSc: 0-No pain      Patients Stated Pain Goal: 0 (44/46/19 0122)  Complications: No apparent anesthesia complications

## 2017-06-02 NOTE — Anesthesia Procedure Notes (Signed)
Procedure Name: Intubation Date/Time: 06/02/2017 4:47 PM Performed by: Adalberto Ill, CRNA Pre-anesthesia Checklist: Patient identified, Emergency Drugs available, Suction available, Patient being monitored and Timeout performed Patient Re-evaluated:Patient Re-evaluated prior to induction Oxygen Delivery Method: Circle system utilized Preoxygenation: Pre-oxygenation with 100% oxygen Induction Type: IV induction Ventilation: Mask ventilation without difficulty Laryngoscope Size: Miller and 2 Grade View: Grade I Tube type: Oral Tube size: 7.0 mm Number of attempts: 1 Airway Equipment and Method: Stylet Placement Confirmation: ETT inserted through vocal cords under direct vision,  positive ETCO2 and breath sounds checked- equal and bilateral Secured at: 23 cm Tube secured with: Tape Dental Injury: Teeth and Oropharynx as per pre-operative assessment

## 2017-06-02 NOTE — Anesthesia Procedure Notes (Signed)
Central Venous Catheter Insertion Performed by: Roderic Palau, MD, anesthesiologist Start/End12/14/2018 4:50 PM, 06/02/2017 5:00 PM Patient location: Pre-op. Preanesthetic checklist: patient identified, IV checked, site marked, risks and benefits discussed, surgical consent, monitors and equipment checked, pre-op evaluation, timeout performed and anesthesia consent Position: Trendelenburg Lidocaine 1% used for infiltration and patient sedated Hand hygiene performed , maximum sterile barriers used  and Seldinger technique used Catheter size: 8 Fr Total catheter length 16. Central line was placed.Double lumen Procedure performed using ultrasound guided technique. Ultrasound Notes:anatomy identified, needle tip was noted to be adjacent to the nerve/plexus identified, no ultrasound evidence of intravascular and/or intraneural injection and image(s) printed for medical record Attempts: 1 Following insertion, dressing applied, line sutured and Biopatch. Post procedure assessment: blood return through all ports  Patient tolerated the procedure well with no immediate complications.

## 2017-06-02 NOTE — Care Management Note (Signed)
Case Management Note  Patient Details  Name: Ashlee Miles MRN: [id number] Date of Birth: 1941/05/10  Subjective/Objective:  From home , active with [facility] for [facility], HHPT, she is made [facility]  Per Butch Penny with [facility].  Presents with  Left groin hematoma, ESRD hx MWF, has prot c def, anemia of ckd, chronic pain syndrome.                  Action/Plan: DC home with Resumption of HH when medically stable, will need resumption orders for HHRN, HHPT. NCM will follow for dc needs.   Expected Discharge Date:                  Expected Discharge Plan:  [place]  In-House Referral:     Discharge planning Services  CM Consult  Post Acute Care Choice:  Home Health, Resumption of Svcs/PTA Provider Choice offered to:     DME Arranged:    DME Agency:     HH Arranged:  PT, RN [place] Agency:  [place]  Status of Service:  In process, will continue to follow  If discussed at Long Length of Stay Meetings, dates discussed:    Additional Comments:  Zenon Mayo, RN 06/02/2017, 12:49 PM

## 2017-06-02 NOTE — Progress Notes (Signed)
Had visit with patient as nurse shared she has surgery coming up today.  Had prayer and patient requested a chaplain to see her daughter Mel Almond who is on 2nd floor. Conard Novak, Chaplain   06/02/17 1100  Clinical Encounter Type  Visited With Patient  Visit Type Initial;Pre-op  Referral From Nurse  Consult/Referral To Chaplain  Spiritual Encounters  Spiritual Needs Prayer  Stress Factors  Patient Stress Factors None identified  Family Stress Factors Other (Comment)  Advance Directives (For Healthcare)  Does Patient Have a Medical Advance Directive? (daughter also a patient 2nd floor)

## 2017-06-02 NOTE — Progress Notes (Signed)
Pharmacy Antibiotic Note Ashlee Miles is a 76 y.o. female admitted on 05/31/2017 with L groin infection. Pharmacy following on cefazolin dosing.   Plan: 1. Continue cefazolin 1g IV Q24h until HD schedule confirmed  Height: 5\' 1"  (154.9 cm) Weight: 226 lb 13.7 oz (102.9 kg) IBW/kg (Calculated) : 47.8  Temp (24hrs), Avg:98.2 F (36.8 C), Min:97.5 F (36.4 C), Max:99 F (37.2 C)  Recent Labs  Lab 05/31/17 1618 05/31/17 1631 06/01/17 0415 06/01/17 2226  WBC 9.5  --  7.6 9.9  CREATININE 4.99*  --  4.67* 4.72*  LATICACIDVEN  --  1.74  --   --     Estimated Creatinine Clearance: 11.2 mL/min (A) (by C-G formula based on SCr of 4.72 mg/dL (H)).    No Known Allergies  Thank you for allowing pharmacy to be a part of this patient's care.  Vincenza Hews, PharmD, BCPS 06/02/2017, 10:21 AM

## 2017-06-02 NOTE — Progress Notes (Signed)
Dr. Hal Hope came by to see the pt around 0100. He stated that he believed that the pt could wait to be taken to surgery in the morning as long as no more acute changes occurred.  The 0400 reassessment of the pt's left leg showed continued severe pain to the left lower extremity, pedal and popliteal pulses were able to be palpated, the left thigh circumference was maintained at 63 cm, the left foot was slightly cool to the touch but there was no change in sensation.  I gave the pt PRN morphine 1 MG Q4H as ordered at [facility] and 0447, this has shown to help keep the patient calm and to help alleviate some of the pain, however the patient is still in a considerable amount of pain which will not be able to be relieved until the patient is seen by [facility] in the AM.  I will continue to monitor the pt closely for any acute changes.

## 2017-06-02 NOTE — Op Note (Signed)
    Patient name: Ashlee Miles MRN: [id number] DOB: [birth year] Sex: female  06/02/2017 Pre-operative Diagnosis: left thigh hematome Post-operative diagnosis:  Same Surgeon:  Annamarie Major Assistants:  Arlee Muslim, P.A. Procedure:   #1: Evacuation of left thigh hematoma   #2: Application of wound VAC   #3: Placement of antibiotic coated beads   Anesthesia: General Blood Loss:  See anesthesia record Specimens: None  Findings: Large hematoma within the medial aspect of the left thigh.  This was completely evacuated.  No active bleeding was identified.  I was able to palpate the medial limb of the left thigh dialysis graft.  This appeared to be incorporated and not exposed.  Antibiotic impregnated beads were placed in the cavity followed by wound VAC  Indications: Approximately 1 month ago the patient developed a hematoma in the left thigh.  She is found to have a pseudoaneurysm within her left thigh dialysis graft which was treated with a covered stent.  She presented to the hospital with an acute change approximately 2 days ago.  She was having significant pain in her left thigh.  She is on Coumadin for hypercoagulable state and DVT.  This was reversed and she was brought to the operating room today for exploration.  She has received multiple units of blood overnight for drop in her hematocrit and expansion of her thigh.  Procedure:  The patient was identified in the holding area and taken to [place] 16  The patient was then placed supine on the table. general anesthesia was administered.  The patient was prepped and draped in the usual sterile fashion.  A time out was called and antibiotics were administered.  A elliptical incision was made over the approximate 6 x 4 cm area of necrotic skin.  This paddle of skin was completely resected.  She had a large hematoma was tract medially, inferiorly and superiorly.  All of the hematoma was completely evacuated.  The wound was copiously irrigated.  I  inspected the wound and there was no active bleeding.  I was also able to palpate the medial aspect of her graft which was right near the incision.  The graft had a good thrill within it, and appeared to be incorporated in her subcutaneous tissue with no area of the graft exposed.  I then placed antibiotic impregnated beads (vancomycin and gentamicin) within her wound.  I then placed a medium sized wound VAC within the wound and connected this to suction with a good seal.  She was extubated and taken to recovery room in stable condition.   Disposition: To PACU stable   V. Annamarie Major, M.D. [facility] Office: [phone] Pager:  [phone]

## 2017-06-02 NOTE — Interval H&P Note (Signed)
History and Physical Interval Note:  06/02/2017 3:22 PM  Ashlee Miles  has presented today for surgery, with the diagnosis of right thigh hematoma  The various methods of treatment have been discussed with the patient and family. After consideration of risks, benefits and other options for treatment, the patient has consented to  Procedure(s): THROMBECTOMY AND REVISION OF ARTERIOVENTOUS (AV) GORETEX  GRAFT RIGHT THIGH (Right) as a surgical intervention .  The patient's history has been reviewed, patient examined, no change in status, stable for surgery.  I have reviewed the patient's chart and labs.  Questions were answered to the patient's satisfaction.     Annamarie Major

## 2017-06-02 NOTE — Anesthesia Preprocedure Evaluation (Addendum)
Anesthesia Evaluation  Patient identified by MRN, date of birth, ID band Patient awake    Reviewed: Allergy & Precautions, H&P , NPO status , Patient's Chart, lab work & pertinent test results, reviewed documented beta blocker date and time   Airway Mallampati: III  TM Distance: >3 FB Neck ROM: Full    Dental no notable dental hx. (+) Teeth Intact, Dental Advisory Given   Pulmonary neg pulmonary ROS,    Pulmonary exam normal breath sounds clear to auscultation       Cardiovascular hypertension, Pt. on medications and Pt. on home beta blockers +CHF   Rhythm:Regular Rate:Normal     Neuro/Psych Depression TIA   GI/Hepatic Neg liver ROS, GERD  Medicated and Controlled,  Endo/Other  diabetes, Insulin DependentMorbid obesity  Renal/GU ESRF and DialysisRenal disease  negative genitourinary   Musculoskeletal   Abdominal   Peds  Hematology negative hematology ROS (+) anemia ,   Anesthesia Other Findings   Reproductive/Obstetrics negative OB ROS                            Anesthesia Physical Anesthesia Plan  ASA: III  Anesthesia Plan: General   Post-op Pain Management:    Induction: Intravenous  PONV Risk Score and Plan: 4 or greater and Ondansetron, Dexamethasone and Midazolam  Airway Management Planned: Oral ETT  Additional Equipment: CVP and Ultrasound Guidance Line Placement  Intra-op Plan:   Post-operative Plan: Extubation in OR  Informed Consent: I have reviewed the patients History and Physical, chart, labs and discussed the procedure including the risks, benefits and alternatives for the proposed anesthesia with the patient or authorized representative who has indicated his/her understanding and acceptance.   Dental advisory given  Plan Discussed with: CRNA  Anesthesia Plan Comments:        Anesthesia Quick Evaluation

## 2017-06-02 NOTE — ED Provider Notes (Signed)
[facility] Provider Note   CSN: [id number] Arrival date & time: 05/31/17  1351     History   Chief Complaint Chief Complaint  Patient presents with  . Leg Pain    left    HPI Ashlee Miles is a 76 y.o. female.  HPI Patient presents to the emergency department with left thigh pain that has been increasing over the last few days the patient was sent from dialysis due to the fact that she is having worsening pain.  The patient was seen previously for similar episode by Dr. 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  Patient states that the pain got worse and the swelling is increased.  The patient denies chest pain, shortness of breath, headache,blurred vision, neck pain, fever, cough, weakness, numbness, dizziness, anorexia, edema, abdominal pain, nausea, vomiting, diarrhea, rash, back pain, dysuria, hematemesis, bloody stool, near syncope, or syncope. Past Medical History:  Diagnosis Date  . CHF (congestive heart failure) ([place])   . Diabetes mellitus without complication ([place])   . ESRD (end stage renal disease) on dialysis ([place])   . History of pituitary adenoma   . Hypertension   . Protein C deficiency [facility])     Patient Active Problem List   Diagnosis Date Noted  . Diabetes mellitus with end stage renal disease ([place]) 05/31/2017  . Depression 05/31/2017  . GERD (gastroesophageal reflux disease) 05/31/2017  . Left groin pain 05/31/2017  . Protein C deficiency ([place])   . Left leg swelling 05/09/2017  . 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 Falls 04/25/2017  . Hypotension 04/25/2017  . Epigastric pain 04/25/2017  . Constipation 04/25/2017  . ESRD (end stage renal disease) on dialysis ([place])   . ESRD on dialysis ([place]) 04/06/2017  . Anemia due to chronic kidney disease 04/06/2017  . Thrombocytopenia ([place]) 04/06/2017  . Cough in adult 04/06/2017  . DVT (deep venous thrombosis) ([place]) 04/05/2017  . Pressure injury of  skin 03/14/2017  . Acute metabolic encephalopathy 76/73/4193  . Skin lesion of hand 03/12/2017  . TIA (transient ischemic attack) 03/01/2017  . Syncope 03/01/2017  . Diabetes mellitus with complication ([place]) 79/07/4095  . Essential hypertension 03/01/2017  . Chronic diastolic CHF (congestive heart failure) ([place]) 03/01/2017    Past Surgical History:  Procedure Laterality Date  . AV FISTULA PLACEMENT Left 03/16/2017   Procedure: INSERTION OF ARTERIOVENOUS (AV) GORE-TEX GRAFT THIGH-LEFT;  Surgeon: Angelia Mould, MD;  Location: [facility];  Service: Vascular;  Laterality: Left;  . CHOLECYSTECTOMY    . DIALYSIS FISTULA CREATION     clotted off  . FALSE ANEURYSM REPAIR Left 05/10/2017   Procedure: REPAIR FALSE ANEURYSM left thigh AVGG using Gore 17mmx5cm Viabahn Endoprosthesis;  Surgeon: Serafina Mitchell, MD;  Location: [facility] OR;  Service: Vascular;  Laterality: Left;  . INSERTION OF DIALYSIS CATHETER Right 03/16/2017   Procedure: INSERTION OF RIGHT FEMORAL TUNNELED DIALYSIS CATHETER;  Surgeon: Angelia Mould, MD;  Location: [facility];  Service: Vascular;  Laterality: Right;  . IVC FILTER INSERTION    . PORTACATH PLACEMENT      OB History    No data available       Home Medications    Prior to Admission medications   Medication Sig Start Date End Date Taking? Authorizing Provider  acetaminophen (TYLENOL) 325 MG tablet Take 650 mg by mouth every 6 (six) hours as needed for mild pain.   Yes [provider]  camphor-menthol Timoteo Ace) lotion  Apply 1 application topically every 8 (eight) hours as needed for itching. 04/12/17  Yes Sheikh, Omair Latif, DO  carvedilol (COREG) 6.25 MG tablet Take 1 tablet (6.25 mg total) by mouth 2 (two) times daily. 03/04/17 03/04/18 Yes Hosie Poisson, MD  cyclobenzaprine (FLEXERIL) 5 MG tablet Take 5 mg by mouth every 12 (twelve) hours as needed for muscle spasms.   Yes [provider]  docusate sodium (COLACE) 100 MG capsule Take 1 capsule  (100 mg total) by mouth 2 (two) times daily. 04/12/17  Yes Sheikh, Omair Latif, DO  famotidine (PEPCID) 20 MG tablet Take 1 tablet (20 mg total) daily by mouth. 04/28/17  Yes Lavina Hamman, MD  gabapentin (NEURONTIN) 300 MG capsule Take 300 mg by mouth 3 (three) times daily.   Yes [provider]  guaiFENesin (MUCINEX) 600 MG 12 hr tablet Take 600 mg by mouth 2 (two) times daily as needed.   Yes [provider]  HYDROcodone-acetaminophen (NORCO/VICODIN) 5-325 MG tablet Take 1 tablet 3 (three) times daily as needed by mouth for severe pain. 04/28/17  Yes Lavina Hamman, MD  hydrOXYzine (ATARAX/VISTARIL) 25 MG tablet Take 1 tablet (25 mg total) by mouth every 8 (eight) hours as needed for itching. 04/12/17  Yes Sheikh, Omair Latif, DO  insulin aspart (NOVOLOG) 100 UNIT/ML injection Inject 0-9 Units into the skin 3 (three) times daily with meals. 03/22/17  Yes Debbe Odea, MD  insulin glargine (LANTUS) 100 UNIT/ML injection Inject 6 Units into the skin at bedtime.   Yes [provider]  linaclotide (LINZESS) 145 MCG CAPS capsule Take 145 mcg by mouth daily as needed.    Yes [provider]  midodrine (PROAMATINE) 10 MG tablet Take 1 tablet (10 mg total) by mouth every Monday, Wednesday, and Friday with hemodialysis. 04/14/17  Yes Sheikh, Omair Latif, DO  multivitamin (RENA-VIT) TABS tablet Take 1 tablet by mouth at bedtime. 04/12/17  Yes Sheikh, Omair Latif, DO  omeprazole (PRILOSEC) 20 MG capsule Take 20 mg by mouth daily.   Yes [provider]  oxyCODONE (OXY IR/ROXICODONE) 5 MG immediate release tablet Take 5 mg by mouth every 4 (four) hours as needed for severe pain.   Yes [provider]  pantoprazole (PROTONIX) 40 MG tablet Take 1 tablet (40 mg total) 2 (two) times daily before a meal by mouth. 04/28/17  Yes Lavina Hamman, MD  PARoxetine (PAXIL) 10 MG tablet Take 1 tablet (10 mg total) daily by mouth. 04/28/17  Yes Lavina Hamman, MD    ranitidine (ZANTAC) 150 MG tablet Take 150 mg by mouth 2 (two) times daily.   Yes [provider]  simvastatin (ZOCOR) 40 MG tablet Take 40 mg by mouth daily at 6 PM.   Yes [provider]  sucralfate (CARAFATE) 1 GM/10ML suspension Take 10 mLs (1 g total) 4 (four) times daily -  with meals and at bedtime by mouth. 04/28/17  Yes Lavina Hamman, MD  triamcinolone cream (KENALOG) 0.1 % Apply 1 application topically every 12 (twelve) hours as needed.    Yes [provider]  warfarin (COUMADIN) 5 MG tablet Take 1 tablet (5 mg total) by mouth daily at 6 PM. 05/13/17  Yes Patrecia Pour, Christean Grief, MD  Amino Acids-Protein Hydrolys (FEEDING SUPPLEMENT, PRO-STAT SUGAR FREE 64,) LIQD Take 30 mLs by mouth 2 (two) times daily. Patient not taking: Reported on 05/31/2017 04/12/17   Raiford Noble Latif, DO  ipratropium-albuterol (DUONEB) 0.5-2.5 (3) MG/3ML SOLN Take 3 mLs by  nebulization every 4 (four) hours as needed. Patient not taking: Reported on 05/31/2017 04/12/17   Raiford Noble Latif, DO  methocarbamol (ROBAXIN) 500 MG tablet Take 1 tablet (500 mg total) every 8 (eight) hours as needed by mouth for muscle spasms. Patient not taking: Reported on 05/31/2017 04/28/17   Lavina Hamman, MD  Nutritional Supplements (FEEDING SUPPLEMENT, NEPRO CARB STEADY,) LIQD Take 237 mLs by mouth 3 (three) times daily as needed (Supplement). Patient not taking: Reported on 05/31/2017 04/12/17   Raiford Noble Latif, DO  polyethylene glycol [facility] / GLYCOLAX) packet Take 17 g daily by mouth. Patient not taking: Reported on 05/31/2017 04/28/17   Lavina Hamman, MD    Family History Family History  Problem Relation Age of Onset  . Breast cancer Mother   . Diabetes Sister   . Diabetes Brother   . CAD Other   . Stroke Neg Hx     Social History Social History   Tobacco Use  . Smoking status: Never Smoker  . Smokeless tobacco: Never Used  Substance Use Topics  . Alcohol use: No  . Drug use:  No     Allergies   Patient has no known allergies.   Review of Systems Review of Systems  All other systems negative except as documented in the HPI. All pertinent positives and negatives as reviewed in the HPI. Physical Exam Updated Vital Signs BP 127/65   Pulse 77   Temp 97.6 F (36.4 C) (Oral)   Resp (!) 21   Ht 5\' 1"  (1.549 m)   Wt 97.2 kg (214 lb 4.6 oz)   SpO2 100%   BMI 40.49 kg/m   Physical Exam  Constitutional: She is oriented to person, place, and time. She appears well-developed and well-nourished. No distress.  HENT:  Head: Normocephalic and atraumatic.  Mouth/Throat: Oropharynx is clear and moist.  Eyes: Pupils are equal, round, and reactive to light.  Neck: Normal range of motion. Neck supple.  Cardiovascular: Normal rate, regular rhythm and normal heart sounds. Exam reveals no gallop and no friction rub.  No murmur heard. Pulmonary/Chest: Effort normal and breath sounds normal. No respiratory distress. She has no wheezes.  Abdominal: Soft. Bowel sounds are normal. She exhibits no distension. There is no tenderness.  Musculoskeletal:       Left upper leg: She exhibits tenderness and swelling.       Legs: Neurological: She is alert and oriented to person, place, and time. She exhibits normal muscle tone. Coordination normal.  Skin: Skin is warm and dry. Capillary refill takes less than 2 seconds. No rash noted. No erythema.  Psychiatric: She has a normal mood and affect. Her behavior is normal.  Nursing note and vitals reviewed.    ED Treatments / Results  Labs (all labs ordered are listed, but only abnormal results are displayed) Labs Reviewed  COMPREHENSIVE METABOLIC PANEL - Abnormal; Notable for the following components:      Result Value   BUN 47 (*)    Creatinine, Ser 4.99 (*)    Albumin 2.9 (*)    ALT 8 (*)    GFR calc non Af Amer 8 (*)    GFR calc Af Amer 9 (*)    All other components within normal limits  CBC WITH DIFFERENTIAL/PLATELET -  Abnormal; Notable for the following components:   RBC 2.78 (*)    Hemoglobin 7.0 (*)    HCT 22.1 (*)    MCH 25.2 (*)    RDW 17.3 (*)  All other components within normal limits  BASIC METABOLIC PANEL - Abnormal; Notable for the following components:   BUN 48 (*)    Creatinine, Ser 4.67 (*)    Calcium 8.3 (*)    GFR calc non Af Amer 8 (*)    GFR calc Af Amer 10 (*)    All other components within normal limits  CBC - Abnormal; Notable for the following components:   RBC 1.99 (*)    Hemoglobin 5.0 (*)    HCT 15.5 (*)    MCV 77.9 (*)    MCH 25.1 (*)    RDW 17.0 (*)    All other components within normal limits  PROTIME-INR - Abnormal; Notable for the following components:   Prothrombin Time 38.7 (*)    All other components within normal limits  APTT - Abnormal; Notable for the following components:   aPTT 91 (*)    All other components within normal limits  IRON AND TIBC - Abnormal; Notable for the following components:   Iron 11 (*)    TIBC 141 (*)    Saturation Ratios 8 (*)    All other components within normal limits  FERRITIN - Abnormal; Notable for the following components:   Ferritin 647 (*)    All other components within normal limits  RETICULOCYTES - Abnormal; Notable for the following components:   RBC. 2.26 (*)    All other components within normal limits  PROTIME-INR - Abnormal; Notable for the following components:   Prothrombin Time 37.9 (*)    All other components within normal limits  PROTIME-INR - Abnormal; Notable for the following components:   Prothrombin Time 37.6 (*)    All other components within normal limits  GLUCOSE, CAPILLARY - Abnormal; Notable for the following components:   Glucose-Capillary 100 (*)    All other components within normal limits  CBC - Abnormal; Notable for the following components:   RBC 2.58 (*)    Hemoglobin 6.9 (*)    HCT 20.9 (*)    RDW 16.3 (*)    All other components within normal limits  BASIC METABOLIC PANEL -  Abnormal; Notable for the following components:   Glucose, Bld 207 (*)    BUN 47 (*)    Creatinine, Ser 4.72 (*)    Calcium 8.1 (*)    GFR calc non Af Amer 8 (*)    GFR calc Af Amer 9 (*)    All other components within normal limits  GLUCOSE, CAPILLARY - Abnormal; Notable for the following components:   Glucose-Capillary 63 (*)    All other components within normal limits  PROTIME-INR - Abnormal; Notable for the following components:   Prothrombin Time 16.7 (*)    All other components within normal limits  GLUCOSE, CAPILLARY - Abnormal; Notable for the following components:   Glucose-Capillary 140 (*)    All other components within normal limits  GLUCOSE, CAPILLARY - Abnormal; Notable for the following components:   Glucose-Capillary 102 (*)    All other components within normal limits  GLUCOSE, CAPILLARY - Abnormal; Notable for the following components:   Glucose-Capillary 120 (*)    All other components within normal limits  GLUCOSE, CAPILLARY - Abnormal; Notable for the following components:   Glucose-Capillary 123 (*)    All other components within normal limits  GLUCOSE, CAPILLARY - Abnormal; Notable for the following components:   Glucose-Capillary 159 (*)    All other components within normal limits  MRSA PCR SCREENING  CULTURE, BLOOD (  ROUTINE X 2)  CULTURE, BLOOD (ROUTINE X 2)  PROCALCITONIN  VITAMIN B12  FOLATE  PROTIME-INR  CBC  CBC  CBC  PROTIME-INR  PROTIME-INR  I-STAT CG4 LACTIC ACID, ED  I-STAT CG4 LACTIC ACID, ED  TYPE AND SCREEN  PREPARE RBC (CROSSMATCH)  PREPARE FRESH FROZEN PLASMA    EKG  EKG Interpretation None       Radiology No results found.  Procedures Procedures (including critical care time)  Medications Ordered in ED Medications  oxyCODONE-acetaminophen (PERCOCET/ROXICET) 5-325 MG per tablet 1 tablet (1 tablet Oral Given 06/01/17 2340)  insulin aspart (novoLOG) injection 0-9 Units (0 Units Subcutaneous Not Given 06/01/17 1727)    insulin aspart (novoLOG) injection 0-5 Units (0 Units Subcutaneous Not Given 06/01/17 2321)  ondansetron (ZOFRAN) tablet 4 mg (not administered)    Or  ondansetron (ZOFRAN) injection 4 mg (not administered)  senna-docusate (Senokot-S) tablet 1 tablet (not administered)  zolpidem (AMBIEN) tablet 5 mg (not administered)  hydrALAZINE (APRESOLINE) injection 5 mg (not administered)  ceFAZolin (ANCEF) IVPB 1 g/50 mL premix (0 g Intravenous Stopped 06/02/17 0103)  acetaminophen (TYLENOL) tablet 650 mg (650 mg Oral Given 06/01/17 1444)  camphor-menthol (SARNA) lotion 1 application (not administered)  carvedilol (COREG) tablet 6.25 mg (6.25 mg Oral Given 06/01/17 1739)  docusate sodium (COLACE) capsule 100 mg (100 mg Oral Given 06/01/17 2339)  gabapentin (NEURONTIN) capsule 300 mg (300 mg Oral Given 06/01/17 2338)  guaiFENesin (MUCINEX) 12 hr tablet 600 mg (not administered)  hydrOXYzine (ATARAX/VISTARIL) tablet 25 mg (not administered)  insulin glargine (LANTUS) injection 3 Units (3 Units Subcutaneous Given 06/02/17 0033)  linaclotide (LINZESS) capsule 145 mcg (not administered)  methocarbamol (ROBAXIN) tablet 500 mg (not administered)  midodrine (PROAMATINE) tablet 10 mg (not administered)  multivitamin (RENA-VIT) tablet 1 tablet (1 tablet Oral Given 06/01/17 2337)  feeding supplement (NEPRO CARB STEADY) liquid 237 mL (not administered)  pantoprazole (PROTONIX) EC tablet 40 mg (40 mg Oral Given 06/01/17 1047)  PARoxetine (PAXIL) tablet 10 mg (10 mg Oral Given 06/01/17 1304)  simvastatin (ZOCOR) tablet 40 mg (40 mg Oral Given 06/01/17 1738)  sucralfate (CARAFATE) 1 GM/10ML suspension 1 g (1 g Oral Given 06/01/17 2338)  triamcinolone cream (KENALOG) 0.1 % 1 application (not administered)  sodium chloride flush (NS) 0.9 % injection 10-40 mL (10 mLs Intracatheter Given 06/01/17 1055)  0.9 %  sodium chloride infusion (not administered)  ferric gluconate (NULECIT) 125 mg in sodium chloride 0.9 % 100  mL IVPB (not administered)  Darbepoetin Alfa (ARANESP) injection 100 mcg (not administered)  doxercalciferol (HECTOROL) injection 4 mcg (not administered)  morphine 4 MG/ML injection 1 mg (1 mg Intravenous Given 06/02/17 0033)  oxyCODONE-acetaminophen (PERCOCET/ROXICET) 5-325 MG per tablet 1 tablet (1 tablet Oral Given 05/31/17 1832)  ceFAZolin (ANCEF) IVPB 2g/100 mL premix (0 g Intravenous Stopped 05/31/17 2330)  phytonadione (VITAMIN K) 10 mg in dextrose 5 % 50 mL IVPB (0 mg Intravenous Stopped 06/01/17 0500)  sodium chloride 0.9 % bolus 500 mL (500 mLs Intravenous New Bag/Given 06/01/17 0600)  dextrose 50 % solution (50 mLs  Given 06/01/17 0823)  0.9 %  sodium chloride infusion ( Intravenous New Bag/Given 06/01/17 1051)  HYDROmorphone (DILAUDID) injection 2 mg (2 mg Intravenous Given 06/01/17 2110)     Initial Impression / Assessment and Plan / ED Course  I have reviewed the triage vital signs and the nursing notes.  Pertinent labs & imaging results that were available during my care of the patient were reviewed by me and considered in  my medical decision making (see chart for details).     Dr. Trula Slade has come to see the patient in the emergency department and will perform surgery tomorrow.  I spoke with the [facility] who will admit the patient to the hospital.  Patient is given pain control and advised of the plan.  Patient agrees to the plan and all questions were answered Final Clinical Impressions(s) / ED Diagnoses   Final diagnoses:  Protein C deficiency [facility])    ED Discharge Orders    None       Dalia Heading, PA-C 06/02/17 0158    Blanchie Dessert, MD 06/03/17 [phone]

## 2017-06-02 NOTE — Progress Notes (Signed)
PROGRESS NOTE    Patient: Ashlee Miles     PCP: Darlina Rumpf, MD                    DOB: [birth year]            DOA: 05/31/2017 [id number]             DOS: 06/02/2017, 1:07 PM   Date of Service: the patient was seen and examined on 06/02/2017 Subjective:  Examined this a.m., status post 2 units of packed red blood cell transfusion with FFP.  Left upper thigh worsening with edema erythema superficial ulcer which is not ruptured very tender to touch.  And she is complaining of pain and discomfort Vitals are stable, telemetry hypotensive overnight with blood pressure as low as 100/46 currently 126/64, remains to be afebrile  ----------------------------------------------------------------------------------------------------------------------  Brief Narrative:  Left groin pain andLeft leg swelling -worsening left thigh hematoma, edema tenderness Superficial bullae not ruptured Patient is guarding  Continue to treat for infection status post transfusion with 2 units of packed red blood cells and 2 units of FFP Repeated H&H 5.5 /16.7 this a.m., number 2 units of packed red blood cell has been ordered  VVS, Dr. Trula Slade following, anticipating intervention since INR has improved, worsening left thigh hematoma, not improving H&H with blood transfusions   status post attempting of reversing INR, 10 mg of IV vitamin K was given, then INR was 3.89 --- her INR finally has improved to 1.36 today  Vascular team was planning for evacuation of hematoma/blood clot,  Procedure was canceled yesterday secondary to elevated INR Will plan for evaluation in a.m.  Following cultures which is negative date, continue antibiotics of Ancef,  holding Coumadin   Hx of DVT andProtein C deficiency:Patient was on chronic anticoagulation with Coumadin, She presented with ongoing worsening left thigh hematoma Coumadin was reversed, INR is subtherapeutic today 1.36 -Hold her Coumadin - S/p  10 mg of Vk  by IV on 05/31/17 Planning for 2 units of blood transfusion followed by 2 units of FFP on 06/01/17 -will not do IV heparin bridging now due to hematoma  Anemia due to chronic kidney disease: worsened by hematoma. hgb dropped drom 9.1 on 05/12/17 to 7.0 -to 5.0 -improved to 6.9 -dropped again this morning to 5.5 Status post 2 units of packed RBC/ and FFP transfusion, will transfuse with another 2 units of packed red blood cells this a.m. -f/u CBC q6h -INR and PTT  Iron study also is revealing severe iron deficiency, will hold any iron supplements at this time due to possible infection  HTN:  -Continue home medications:Coreg, -IV hydralazine prn  Chronic diastolic CHF (congestive heart failure) ([place]):To recur 03/06/17 showed EF of 55-60% with grade 1 diastolic dysfunction. Patient has trace leg edema, no respiratory distress. CHF is compensated. 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 Patient is taking Lantus and NovoLogat home -will decrease Lantus dose from6-4 units daily -SSI  GERD -continue Protonix,  Depression and anxiety:Stable, no suicidal or homicidal ideations. -Continue home medications  DVT ppx:on IVHeparin  Code Status:DNR  Family Communication: None at bed side.  Disposition Plan: Anticipate discharge back to  previous home environment Consults called:VVS, Dr. Trula Slade Admission status: Inpatient/tele     Family Communication:  The above findings and plan of care has been discussed with patient and she expressed understanding and agreement of  above.   Disposition Plan:   > 3 days   Procedures:   Per vascular team planning for the claudication, hematoma evacuation of the left groin possibly  On 06/02/2017      Antimicrobials:  Anti-infectives (From admission, onward)   Start     Dose/Rate Route Frequency Ordered Stop   06/01/17 2200  ceFAZolin (ANCEF) IVPB 1 g/50 mL premix     1 g 100 mL/hr over 30 Minutes Intravenous Every 24 hours 05/31/17 2100     06/01/17 1100  ceFAZolin (ANCEF) IVPB 1 g/50 mL premix  Status:  Discontinued    Comments:  Send with pt to OR   1 g 100 mL/hr over 30 Minutes Intravenous To ShortStay Surgical 05/31/17 1714 06/01/17 1149   05/31/17 2130  ceFAZolin (ANCEF) IVPB 2g/100 mL premix     2 g 200 mL/hr over 30 Minutes Intravenous  Once 05/31/17 2055 05/31/17 2330       Objective: Vitals:   06/02/17 1001 06/02/17 1016 06/02/17 1119 06/02/17 1245  BP: 108/76 (!) 120/45 (!) 144/74 129/64  Pulse: 73 68 71 72  Resp: 10 11 15    Temp: 97.7 F (36.5 C) 98 F (36.7 C) 98 F (36.7 C) (!) 97.5 F (36.4 C)  TempSrc: Oral Oral Oral Oral  SpO2: 100% 100% 100% 100%  Weight:      Height:        Intake/Output Summary (Last 24 hours) at 06/02/2017 1307 Last data filed at 06/02/2017 1001 Gross per 24 hour  Intake 2148 ml  Output 175 ml  Net 1973 ml   Filed Weights   05/31/17 1405 06/01/17 0621 06/02/17 0455  Weight: 97.1 kg (214 lb) 97.2 kg (214 lb 4.6 oz) 102.9 kg (226 lb 13.7 oz)    Examination:  General exam: Appears calm, in mild to moderate acute distress complaining of worsening left eye edema erythema and tenderness and pain. Respiratory system: Clear to auscultation. Respiratory effort normal. Cardiovascular system: S1 & S2 heard, RRR. No JVD, murmurs, rubs, gallops or clicks. No pedal edema. Gastrointestinal system: Abdomen is nondistended, soft and nontender. No organomegaly or masses felt. Normal bowel sounds heard. Central nervous system: Alert and oriented. No focal  neurological deficits. Extremities: Left upper thigh: Extensive edema erythema nonruptured bullae, tender to touch Positive pulses in all 4 extremities Skin: Status of erythema edema with bullae on left upper thigh need for any other rashes or open wounds Psychiatry: Judgement and insight appear normal. Mood & affect appropriate.     Data Reviewed: I have personally reviewed following labs and imaging studies  CBC: Recent Labs  Lab 05/31/17 1618 06/01/17 0415 06/01/17 2226 06/02/17 0825  WBC 9.5 7.6 9.9  --   NEUTROABS 7.5  --   --   --   HGB 7.0* 5.0* 6.9* 5.5*  HCT 22.1* 15.5* 20.9* 16.7*  MCV 79.5 77.9* 81.0  --   PLT 322 240 217  --    Basic Metabolic Panel: Recent Labs  Lab 05/31/17 1618 06/01/17 0415 06/01/17 2226  NA 142 140 139  K 4.2 3.7 3.9  CL 107 107 109  CO2 24 26 24   GLUCOSE 89 76 207*  BUN 47* 48* 47*  CREATININE 4.99* 4.67* 4.72*  CALCIUM 9.0 8.3* 8.1*   GFR:  Estimated Creatinine Clearance: 11.2 mL/min (A) (by C-G formula based on SCr of 4.72 mg/dL (H)). Liver Function Tests: Recent Labs  Lab 05/31/17 1618  AST 17  ALT 8*  ALKPHOS 77  BILITOT 0.8  PROT 6.6  ALBUMIN 2.9*   No results for input(s): LIPASE, AMYLASE in the last 168 hours. No results for input(s): AMMONIA in the last 168 hours. Coagulation Profile: Recent Labs  Lab 05/31/17 0012 05/31/17 2306 06/01/17 0415 06/01/17 2226  INR 4.00 3.89 3.86 1.36   Cardiac Enzymes: No results for input(s): CKTOTAL, CKMB, CKMBINDEX, TROPONINI in the last 168 hours. BNP (last 3 results) No results for input(s): PROBNP in the last 8760 hours. HbA1C: No results for input(s): HGBA1C in the last 72 hours. CBG: Recent Labs  Lab 06/01/17 1939 06/01/17 2254 06/02/17 0744 06/02/17 1042 06/02/17 1121  GLUCAP 123* 159* 278* 240* 213*   Lipid Profile: No results for input(s): CHOL, HDL, LDLCALC, TRIG, CHOLHDL, LDLDIRECT in the last 72 hours. Thyroid Function Tests: No results for input(s):  TSH, T4TOTAL, FREET4, T3FREE, THYROIDAB in the last 72 hours. Anemia Panel: Recent Labs    05/31/17 2306  VITAMINB12 726  FOLATE 28.0  FERRITIN 647*  TIBC 141*  IRON 11*  RETICCTPCT 2.1   Sepsis Labs: Recent Labs  Lab 05/31/17 0012 05/31/17 1631  PROCALCITON 0.16  --   LATICACIDVEN  --  1.74    Recent Results (from the past 240 hour(s))  Culture, blood (Routine X 2) w Reflex to ID Panel     Status: None (Preliminary result)   Collection Time: 05/31/17 10:23 PM  Result Value Ref Range Status   Specimen Description BLOOD RIGHT ARM  Final   Special Requests IN PEDIATRIC BOTTLE Blood Culture adequate volume  Final   Culture NO GROWTH 1 DAY  Final   Report Status PENDING  Incomplete  Culture, blood (Routine X 2) w Reflex to ID Panel     Status: None (Preliminary result)   Collection Time: 05/31/17 10:45 PM  Result Value Ref Range Status   Specimen Description BLOOD RIGHT HAND  Final   Special Requests IN PEDIATRIC BOTTLE Blood Culture adequate volume  Final   Culture NO GROWTH 1 DAY  Final   Report Status PENDING  Incomplete  MRSA PCR Screening     Status: None   Collection Time: 06/01/17  7:14 PM  Result Value Ref Range Status   MRSA by PCR NEGATIVE NEGATIVE Final    Comment:        The GeneXpert MRSA Assay (FDA approved for NASAL specimens only), is one component of a comprehensive MRSA colonization surveillance program. It is not intended to diagnose MRSA infection nor to guide or monitor treatment for MRSA infections.        Radiology Studies: No results found.  Scheduled Meds: . carvedilol  6.25 mg Oral BID WC  . darbepoetin (ARANESP) injection - DIALYSIS  100 mcg Intravenous Q Fri-HD  . docusate sodium  100 mg Oral BID  . doxercalciferol  4 mcg Intravenous Q M,W,F-HD  . gabapentin  300 mg Oral TID  . insulin aspart  0-5 Units Subcutaneous QHS  . insulin aspart  0-9 Units Subcutaneous TID WC  . insulin glargine  3 Units Subcutaneous QHS  . midodrine   10 mg Oral Q M,W,F-HD  . multivitamin  1 tablet Oral QHS  . pantoprazole  40 mg Oral Daily  . PARoxetine  10 mg Oral Daily  . simvastatin  40 mg Oral q1800  .  sucralfate  1 g Oral TID WC & HS   Continuous Infusions: . sodium chloride    .  ceFAZolin (ANCEF) IV Stopped (06/02/17 0103)  . ferric gluconate (FERRLECIT/NULECIT) IV       LOS: 2 days    Time spent: >25 minutes   Deatra James, MD [facility] Pager [phone]  If 7PM-7AM, please contact night-coverage [email or web address] Password [email or web address] 06/02/2017, 1:07 PM

## 2017-06-03 LAB — TYPE AND SCREEN
ABO/RH(D): B POS
Antibody Screen: NEGATIVE
Unit division: 0
Unit division: 0
Unit division: 0
Unit division: 0
Unit division: 0
Unit division: 0

## 2017-06-03 LAB — BPAM RBC
Blood Product Expiration Date: 201901012359
Blood Product Expiration Date: 201901012359
Blood Product Expiration Date: 201901062359
Blood Product Expiration Date: 201901082359
Blood Product Expiration Date: 201901082359
Blood Product Expiration Date: 201901082359
ISSUE DATE / TIME: 201812130747
ISSUE DATE / TIME: 201812131106
ISSUE DATE / TIME: 201812140950
ISSUE DATE / TIME: 201812141321
ISSUE DATE / TIME: 201812141623
ISSUE DATE / TIME: 201812141623
Unit Type and Rh: 7300
Unit Type and Rh: 7300
Unit Type and Rh: 7300
Unit Type and Rh: 7300
Unit Type and Rh: 7300
Unit Type and Rh: 7300

## 2017-06-03 LAB — GLUCOSE, CAPILLARY
Glucose-Capillary: 92 mg/dL (ref 65–99)
Glucose-Capillary: 100 mg/dL — ABNORMAL HIGH (ref 65–99)
Glucose-Capillary: 193 mg/dL — ABNORMAL HIGH (ref 65–99)

## 2017-06-03 MED ORDER — PHENOL 1.4 % MT LIQD
1.0000 | OROMUCOSAL | Status: DC | PRN
  Administered 2017-06-03: 1 via OROMUCOSAL
  Filled 2017-06-03: qty 177

## 2017-06-03 MED ORDER — DARBEPOETIN ALFA 100 MCG/0.5ML IJ SOSY
100.0000 ug | PREFILLED_SYRINGE | INTRAMUSCULAR | Status: DC
  Filled 2017-06-03: qty 0.5

## 2017-06-03 NOTE — Progress Notes (Signed)
PROGRESS NOTE    Ashlee Miles  MWN:[id number] DOB: 09/18/1940 DOA: 05/31/2017 PCP: Darlina Rumpf, MD   Brief Narrative: 76 year old female with history of hypertension, diabetes, ESRD on hemodialysis, DVT, protein C deficiency on Coumadin, pituitary adenoma, chronic diastolic congestive heart failure, GERD, depression presented with left groin pain and left leg swelling found to have left groin hematoma.  Patient underwent hematoma evacuation.  On antibiotics empirically.  Receiving dialysis per nephrologist.  Assessment & Plan:  #Left groin pain, left leg swelling due to left thigh hematoma: - status post left thigh hematoma evacuation, application of wound VAC and placement of antibiotic coated beads. -Vascular consult appreciated.  Patient received vitamin K and FFP to reverse Coumadin.  Received blood transfusion.  Hemoglobin is stable.  Defer to vascular surgery for the timing of resuming anticoagulation.  May be able to de-escalate antibiotics in the next 1 or 2 days.  Continue pain management.  Patient has a wound VAC.  #History of DVT and protein C deficiency: Coumadin was reversed.  Hematoma seems stable.  Resume Coumadin when okay from vascular surgery. -Patient has received 2 units of FFP and vitamin K.  Also received 2 units of red blood cell transfusion.  #Anemia due to chronic kidney disease, acute blood loss from hematoma: I received a blood products.  Continue to monitor.  Continue IV iron and ESA during dialysis per nephrology.  #History of hypertension: Continue Coreg.  Monitor blood pressure.  #ESRD on hemodialysis: Receiving hemodialysis today.  Nephrology consult appreciated.  #Chronic diastolic congestive heart failure: No shortness of breath or chest pain.  Volume management during dialysis.  #Diabetes with ESRD: Continue insulin and monitor blood sugar level  #GERD: Continue Protonix  #Anxiety depression: Mood is stable.  PT OT evaluation.  DVT  prophylaxis:SCD Code Status:DNR Family Communication: No family at bedside Disposition Plan: Currently admitted.  PT OT evaluation.    Consultants:   Nephrology  Vascular surgery  Procedures: Vascular surgery Antimicrobials: Cefazolin  Subjective: Seen and examined at bedside.  Reported left thigh pain at the site of wound VAC.  Denied headache, dizziness, nausea vomiting chest pain shortness of breath.  Objective: Vitals:   06/03/17 1330 06/03/17 1400 06/03/17 1430 06/03/17 1447  BP: (!) 131/46 (!) 130/44 (!) 122/49 (!) 139/58  Pulse: 68 69 77 70  Resp: 15 14 14  (!) 25  Temp:      TempSrc:      SpO2:      Weight:      Height:        Intake/Output Summary (Last 24 hours) at 06/03/2017 1537 Last data filed at 06/03/2017 0900 Gross per 24 hour  Intake 1050 ml  Output 960 ml  Net 90 ml   Filed Weights   06/01/17 0621 06/02/17 0455 06/02/17 1605  Weight: 97.2 kg (214 lb 4.6 oz) 102.9 kg (226 lb 13.7 oz) 102.9 kg (226 lb 13.7 oz)    Examination:  General exam: Appears calm and comfortable  Respiratory system: Clear to auscultation. Respiratory effort normal. No wheezing or crackle Cardiovascular system: S1 & S2 heard, RRR.  No pedal edema. Gastrointestinal system: Abdomen is nondistended, soft and nontender. Normal bowel sounds heard. Central nervous system: Alert and oriented. No focal neurological deficits. Extremities: Left thigh has a wound VAC.  Legs are warm and has good circulation. Skin: No rashes, lesions or ulcers Psychiatry: Judgement and insight appear normal. Mood & affect appropriate.     Data Reviewed: I have personally reviewed following labs and imaging  studies  CBC: Recent Labs  Lab 05/31/17 1618 06/01/17 0415 06/01/17 2226 06/02/17 0825 06/02/17 1700 06/02/17 2223  WBC 9.5 7.6 9.9  --   --  12.1*  NEUTROABS 7.5  --   --   --   --   --   HGB 7.0* 5.0* 6.9* 5.5* 8.2* 12.1  HCT 22.1* 15.5* 20.9* 16.7* 24.0* 33.4*  MCV 79.5 77.9* 81.0   --   --  82.1  PLT 322 240 217  --   --  027   Basic Metabolic Panel: Recent Labs  Lab 05/31/17 1618 06/01/17 0415 06/01/17 2226 06/02/17 1700  NA 142 140 139 145  K 4.2 3.7 3.9 4.0  CL 107 107 109  --   CO2 24 26 24   --   GLUCOSE 89 76 207* 80  BUN 47* 48* 47*  --   CREATININE 4.99* 4.67* 4.72*  --   CALCIUM 9.0 8.3* 8.1*  --    GFR: Estimated Creatinine Clearance: 11.2 mL/min (A) (by C-G formula based on SCr of 4.72 mg/dL (H)). Liver Function Tests: Recent Labs  Lab 05/31/17 1618  AST 17  ALT 8*  ALKPHOS 77  BILITOT 0.8  PROT 6.6  ALBUMIN 2.9*   No results for input(s): LIPASE, AMYLASE in the last 168 hours. No results for input(s): AMMONIA in the last 168 hours. Coagulation Profile: Recent Labs  Lab 05/31/17 0012 05/31/17 2306 06/01/17 0415 06/01/17 2226  INR 4.00 3.89 3.86 1.36   Cardiac Enzymes: No results for input(s): CKTOTAL, CKMB, CKMBINDEX, TROPONINI in the last 168 hours. BNP (last 3 results) No results for input(s): PROBNP in the last 8760 hours. HbA1C: No results for input(s): HGBA1C in the last 72 hours. CBG: Recent Labs  Lab 06/02/17 1121 06/02/17 1608 06/02/17 1819 06/02/17 2056 06/03/17 0817  GLUCAP 213* 106* 75 103* 92   Lipid Profile: No results for input(s): CHOL, HDL, LDLCALC, TRIG, CHOLHDL, LDLDIRECT in the last 72 hours. Thyroid Function Tests: No results for input(s): TSH, T4TOTAL, FREET4, T3FREE, THYROIDAB in the last 72 hours. Anemia Panel: Recent Labs    05/31/17 2306  VITAMINB12 726  FOLATE 28.0  FERRITIN 647*  TIBC 141*  IRON 11*  RETICCTPCT 2.1   Sepsis Labs: Recent Labs  Lab 05/31/17 0012 05/31/17 1631  PROCALCITON 0.16  --   LATICACIDVEN  --  1.74    Recent Results (from the past 240 hour(s))  Culture, blood (Routine X 2) w Reflex to ID Panel     Status: None (Preliminary result)   Collection Time: 05/31/17 10:23 PM  Result Value Ref Range Status   Specimen Description BLOOD RIGHT ARM  Final    Special Requests IN PEDIATRIC BOTTLE Blood Culture adequate volume  Final   Culture NO GROWTH 2 DAYS  Final   Report Status PENDING  Incomplete  Culture, blood (Routine X 2) w Reflex to ID Panel     Status: None (Preliminary result)   Collection Time: 05/31/17 10:45 PM  Result Value Ref Range Status   Specimen Description BLOOD RIGHT HAND  Final   Special Requests IN PEDIATRIC BOTTLE Blood Culture adequate volume  Final   Culture NO GROWTH 2 DAYS  Final   Report Status PENDING  Incomplete  MRSA PCR Screening     Status: None   Collection Time: 06/01/17  7:14 PM  Result Value Ref Range Status   MRSA by PCR NEGATIVE NEGATIVE Final    Comment:        The  GeneXpert MRSA Assay (FDA approved for NASAL specimens only), is one component of a comprehensive MRSA colonization surveillance program. It is not intended to diagnose MRSA infection nor to guide or monitor treatment for MRSA infections.          Radiology Studies: Dg Chest Port 1 View  Result Date: 06/02/2017 CLINICAL DATA:  Central line placement. EXAM: PORTABLE CHEST 1 VIEW COMPARISON:  Radiograph 04/25/2017 FINDINGS: New right internal jugular central venous catheter tip in the mid SVC. Unchanged right chest port with tip in the mid SVC. No pneumothorax. Lower lung volumes from prior exam leading to bronchovascular crowding. No overt pulmonary edema. Unchanged cardiomegaly and mediastinal contours. Mild bibasilar atelectasis. No large pleural effusion. IMPRESSION: 1. New right internal jugular central venous catheter tip in the mid SVC. No pneumothorax. 2. Lower lung volumes with bronchovascular crowding and bibasilar atelectasis. Electronically Signed   By: Jeb Levering M.D.   On: 06/02/2017 20:02        Scheduled Meds: . carvedilol  6.25 mg Oral BID WC  . docusate sodium  100 mg Oral BID  . doxercalciferol  4 mcg Intravenous Q M,W,F-HD  . gabapentin  300 mg Oral TID  . insulin aspart  0-5 Units Subcutaneous QHS    . insulin aspart  0-9 Units Subcutaneous TID WC  . insulin glargine  3 Units Subcutaneous QHS  . midodrine  10 mg Oral Q M,W,F-HD  . multivitamin  1 tablet Oral QHS  . pantoprazole  40 mg Oral Daily  . PARoxetine  10 mg Oral Daily  . simvastatin  40 mg Oral q1800  . sucralfate  1 g Oral TID WC & HS   Continuous Infusions: . sodium chloride    .  ceFAZolin (ANCEF) IV Stopped (06/02/17 2311)  . ferric gluconate (FERRLECIT/NULECIT) IV       LOS: 3 days    Eberardo Demello Tanna Furry, MD [facility] Pager [phone]  If 7PM-7AM, please contact night-coverage [email or web address] Password [facility] 06/03/2017, 3:37 PM

## 2017-06-03 NOTE — Progress Notes (Signed)
[facility] Progress Note  Subjective: went to OR last night, had hematoma evacuation, no sign of graft infection, left in prophylactic AB impregnated beads.  Doing well today.   Vitals:   06/03/17 0800 06/03/17 0900 06/03/17 1000 06/03/17 1100  BP: (!) 118/38 (!) 123/105 105/66 (!) 143/64  Pulse: 67 72  74  Resp: (!) 26 (!) 27 16 (!) 21  Temp: 98 F (36.7 C)     TempSrc: Oral     SpO2: 97% 100%  93%  Weight:      Height:        Inpatient medications: . carvedilol  6.25 mg Oral BID WC  . darbepoetin (ARANESP) injection - DIALYSIS  100 mcg Intravenous Q Fri-HD  . docusate sodium  100 mg Oral BID  . doxercalciferol  4 mcg Intravenous Q M,W,F-HD  . gabapentin  300 mg Oral TID  . insulin aspart  0-5 Units Subcutaneous QHS  . insulin aspart  0-9 Units Subcutaneous TID WC  . insulin glargine  3 Units Subcutaneous QHS  . midodrine  10 mg Oral Q M,W,F-HD  . multivitamin  1 tablet Oral QHS  . pantoprazole  40 mg Oral Daily  . PARoxetine  10 mg Oral Daily  . simvastatin  40 mg Oral q1800  . sucralfate  1 g Oral TID WC & HS   . sodium chloride    .  ceFAZolin (ANCEF) IV Stopped (06/02/17 2311)  . ferric gluconate (FERRLECIT/NULECIT) IV     acetaminophen, camphor-menthol, feeding supplement (NEPRO CARB STEADY), guaiFENesin, hydrALAZINE, hydrOXYzine, linaclotide, methocarbamol, morphine injection, ondansetron **OR** ondansetron (ZOFRAN) IV, oxyCODONE-acetaminophen, senna-docusate, sodium chloride flush, triamcinolone cream, zolpidem  Exam: Obese AAF, no distress No jvd Chest clear  RRR no mrg ABd soft obese ntnd Ext L thigh marked edema, very tender. No RLE edema.  NF, ox 3   L thigh AVG +bruit   Home meds:  -lantus/ novolog -midodrine 10 mg pre HD MWF / coreg 6.25 bid -coumadin/ PPI/ H2B/ carafate 1gm qid/ statin -paxil/ oxy IR/ neurontin 300 tid/ norco prn -prn miralax/ flexeril/ colace/ atarax/ linzess/ robaxin/ duoneb    Dialysis: [facility] MWF 4h   3K/2.25  bath   98.5kg   Hep 3000   L thigh AVG -hect 4  -Fe thru 12/14  -mircera 150 last dose 11/2      Impression: 1. L thigh graft bleed / hematoma - sp hematoma evac on 12/14. Stable, OK to use AV graft outside (lateral) limb 2. ESRD - HD MWF. Missed yest, HD today using lateral limb of graft 3. Prot C def - on coumadin at home 4. Anemia of CKD/ acute blood loss - better, Hb up 8-9 range today 5. MBD of CKD - cont meds 6. Volume -up 3-4 kg 7. Chronic pain syndrome   Plan - HD today, UF 3 L as Ronnie Derby MD [place] pager [phone]   06/03/2017, 12:50 PM   Recent Labs  Lab 05/31/17 1618 06/01/17 0415 06/01/17 2226 06/02/17 1700  NA 142 140 139 145  K 4.2 3.7 3.9 4.0  CL 107 107 109  --   CO2 24 26 24   --   GLUCOSE 89 76 207* 80  BUN 47* 48* 47*  --   CREATININE 4.99* 4.67* 4.72*  --   CALCIUM 9.0 8.3* 8.1*  --    Recent Labs  Lab 05/31/17 1618  AST 17  ALT 8*  ALKPHOS 77  BILITOT 0.8  PROT  6.6  ALBUMIN 2.9*   Recent Labs  Lab 05/31/17 1618 06/01/17 0415 06/01/17 2226 06/02/17 0825 06/02/17 1700 06/02/17 2223  WBC 9.5 7.6 9.9  --   --  12.1*  NEUTROABS 7.5  --   --   --   --   --   HGB 7.0* 5.0* 6.9* 5.5* 8.2* 12.1  HCT 22.1* 15.5* 20.9* 16.7* 24.0* 33.4*  MCV 79.5 77.9* 81.0  --   --  82.1  PLT 322 240 217  --   --  197   Iron/TIBC/Ferritin/ %Sat    Component Value Date/Time   IRON 11 (L) 05/31/2017 2306   TIBC 141 (L) 05/31/2017 2306   FERRITIN 647 (H) 05/31/2017 2306   IRONPCTSAT 8 (L) 05/31/2017 2306

## 2017-06-03 NOTE — Anesthesia Postprocedure Evaluation (Signed)
Anesthesia Post Note  Patient: Ashlee Miles  Procedure(s) Performed: EVACUATION OF LEFT THIGH HEMATOMA  AND APPLICATION OF WOUND VAC (Left )     Patient location during evaluation: PACU Anesthesia Type: General Level of consciousness: awake and alert Pain management: pain level controlled Vital Signs Assessment: post-procedure vital signs reviewed and stable Respiratory status: spontaneous breathing, nonlabored ventilation, respiratory function stable and patient connected to nasal cannula oxygen Cardiovascular status: blood pressure returned to baseline and stable Postop Assessment: no apparent nausea or vomiting Anesthetic complications: no    Last Vitals:  Vitals:   06/03/17 0000 06/03/17 0107  BP:  (!) 138/58  Pulse:    Resp:    Temp: 36.7 C 37.1 C  SpO2:      Last Pain:  Vitals:   06/03/17 0107  TempSrc: Oral  PainSc: [place]                 Charli Halle DAVID

## 2017-06-03 NOTE — Progress Notes (Signed)
Pharmacy Antibiotic Note  Ashlee Miles is a 76 y.o. female admitted on 05/31/2017 with L thigh hematoma. On coumadin PTA for h/o protein C deficiency/DVT.   Pharmacy was  consulted on 05/31/17  for cefazolin dosing for possible left groin/thigh infection. Now s/p evacuation of left thigh hematomal on 12/14. VVS noted  no sign of graft infectio.  WBC increased to 12.1K.  Afebrile  12/12 BCx x2: ngtd 12/13 MRSA PCR: negative   Plan: Continue Cefazolin 1g IV Q24h .  Monitor clinical picture, renal function F/U C&S, abx deescalation / LOT   Height: 5' 0.98" (154.9 cm) Weight: 226 lb 13.7 oz (102.9 kg) IBW/kg (Calculated) : 47.76  Temp (24hrs), Avg:97.9 F (36.6 C), Min:97.4 F (36.3 C), Max:98.8 F (37.1 C)  Recent Labs  Lab 05/31/17 1618 05/31/17 1631 06/01/17 0415 06/01/17 2226 06/02/17 2223  WBC 9.5  --  7.6 9.9 12.1*  CREATININE 4.99*  --  4.67* 4.72*  --   LATICACIDVEN  --  1.74  --   --   --     Estimated Creatinine Clearance: 11.2 mL/min (A) (by C-G formula based on SCr of 4.72 mg/dL (H)).    No Known Allergies  Thank you for allowing pharmacy to be a part of this patient's care.  Nicole Cella, RPh Clinical Pharmacist 8A-[id number] [phone] After 4PM call [place] [phone] 06/03/2017 1:49 PM

## 2017-06-03 NOTE — Progress Notes (Signed)
Patient ID: Ashlee Miles, female   DOB: 1940-09-25, 76 y.o.   MRN: [id number] Reports some tightness in her left thigh.  VAC is functioning nicely. Continues to have flow in her left femoral loop AV graft. Foot well perfused.

## 2017-06-04 LAB — RENAL FUNCTION PANEL
Albumin: 2.4 g/dL — ABNORMAL LOW (ref 3.5–5.0)
Anion gap: 8 (ref 5–15)
BUN: 31 mg/dL — ABNORMAL HIGH (ref 6–20)
CO2: 26 mmol/L (ref 22–32)
Calcium: 8.5 mg/dL — ABNORMAL LOW (ref 8.9–10.3)
Chloride: 101 mmol/L (ref 101–111)
Creatinine, Ser: 3.82 mg/dL — ABNORMAL HIGH (ref 0.44–1.00)
GFR calc Af Amer: 12 mL/min — ABNORMAL LOW (ref >60)
GFR calc non Af Amer: 11 mL/min — ABNORMAL LOW (ref >60)
Glucose, Bld: 140 mg/dL — ABNORMAL HIGH (ref 65–99)
Phosphorus: 3.2 mg/dL (ref 2.5–4.6)
Potassium: 3.9 mmol/L (ref 3.5–5.1)
Sodium: 135 mmol/L (ref 135–145)

## 2017-06-04 LAB — CBC
HCT: 31.5 % — ABNORMAL LOW (ref 36.0–46.0)
Hemoglobin: 10.3 g/dL — ABNORMAL LOW (ref 12.0–15.0)
MCH: 27.2 pg (ref 26.0–34.0)
MCHC: 32.7 g/dL (ref 30.0–36.0)
MCV: 83.3 fL (ref 78.0–100.0)
Platelets: 202 10*3/uL (ref 150–400)
RBC: 3.78 MIL/uL — ABNORMAL LOW (ref 3.87–5.11)
RDW: 15.8 % — ABNORMAL HIGH (ref 11.5–15.5)
WBC: 8.3 10*3/uL (ref 4.0–10.5)

## 2017-06-04 LAB — GLUCOSE, CAPILLARY
Glucose-Capillary: 95 mg/dL (ref 65–99)
Glucose-Capillary: 108 mg/dL — ABNORMAL HIGH (ref 65–99)
Glucose-Capillary: 160 mg/dL — ABNORMAL HIGH (ref 65–99)
Glucose-Capillary: 133 mg/dL — ABNORMAL HIGH (ref 65–99)

## 2017-06-04 LAB — PROTIME-INR
INR: 1.3
Prothrombin Time: 16 seconds — ABNORMAL HIGH (ref 11.4–15.2)

## 2017-06-04 MED ORDER — WARFARIN - PHARMACIST DOSING INPATIENT: Freq: Every day | Status: DC

## 2017-06-04 MED ORDER — WARFARIN SODIUM 5 MG PO TABS
5.0000 mg | ORAL_TABLET | Freq: Once | ORAL | Status: AC
  Administered 2017-06-04: 5 mg via ORAL
  Filled 2017-06-04: qty 1

## 2017-06-04 MED ORDER — DARBEPOETIN ALFA 100 MCG/0.5ML IJ SOSY
100.0000 ug | PREFILLED_SYRINGE | INTRAMUSCULAR | Status: DC
  Administered 2017-06-06: 100 ug via INTRAVENOUS

## 2017-06-04 MED ORDER — WARFARIN SODIUM 5 MG PO TABS: 5.0000 mg | ORAL_TABLET | Freq: Once | ORAL | Status: DC

## 2017-06-04 MED ORDER — WARFARIN - PHARMACIST DOSING INPATIENT
Freq: Every day | Status: DC
  Administered 2017-06-04: 18:00:00

## 2017-06-04 NOTE — Progress Notes (Signed)
Patient ID: Ashlee Miles, female   DOB: 1941/01/25, 76 y.o.   MRN: [id number] Comfortable.  VAC dressing intact. Had successful hemodialysis accessing the lateral aspect of her left femoral loop yesterday.

## 2017-06-04 NOTE — Progress Notes (Signed)
PROGRESS NOTE    Ashlee Miles  PZW:[id number] DOB: April 06, 1941 DOA: 05/31/2017 PCP: Darlina Rumpf, MD   Brief Narrative: 76 year old female with history of hypertension, diabetes, ESRD on hemodialysis, DVT, protein C deficiency on Coumadin, pituitary adenoma, chronic diastolic congestive heart failure, GERD, depression presented with left groin pain and left leg swelling found to have left groin hematoma.  Patient underwent hematoma evacuation.  On antibiotics empirically.  Receiving dialysis per nephrologist.  Assessment & Plan:  #Left groin pain, left leg swelling due to left thigh hematoma: - status post left thigh hematoma evacuation, application of wound VAC and placement of antibiotic coated beads. -Vascular consult appreciated.  Patient received vitamin K and FFP to reverse Coumadin.  Received blood transfusion.   -Discussed with Dr. Donnetta Hutching from vascular surgery.  Discontinue IV antibiotics and resuming Coumadin.  Further evaluation of wound care and wound VAC deferred to surgeon.  PT OT evaluation. -Pain management with morphine and oxycodone.  Try to minimize IV morphine and ESRD patient.  #History of DVT and protein C deficiency: Coumadin was reversed on admission.  Resuming Coumadin after discussion with the vascular surgeon.  Continue to monitor CBC and INR. -Patient  received 2 units of FFP and vitamin K on admission.  Also received 2 units of red blood cell transfusion.  #Anemia due to chronic kidney disease, acute blood loss from hematoma: received a blood products.  Continue to monitor.  Continue IV iron and ESA during dialysis per nephrology.  #History of hypertension: Continue Coreg.  Monitor blood pressure.  #ESRD on hemodialysis: Receiving hemodialysis today.  Nephrology consult appreciated.  #Chronic diastolic congestive heart failure: No shortness of breath or chest pain.  Volume management during dialysis.  #Diabetes with ESRD: Continue insulin and monitor blood  sugar level  #GERD: Continue Protonix  #Anxiety depression: Mood is stable.  PT OT evaluation.  Patient may need wound VAC on discharge.  Case manager referral.  DVT prophylaxis:SCD Code Status:DNR Family Communication: No family at bedside Disposition Plan: Currently admitted.  PT OT evaluation.    Consultants:   Nephrology  Vascular surgery  Procedures: Vascular surgery Antimicrobials: Cefazolin discontinued on December 16  Subjective: Seen and examined at bedside.  Reported still some discomfort at the site of wound VAC.  Denied nausea vomiting chest pain shortness of breath.  Objective: Vitals:   06/04/17 0047 06/04/17 0359 06/04/17 0400 06/04/17 0750  BP:   (!) 106/50 (!) 101/53  Pulse:   71 71  Resp:   11   Temp: 98.8 F (37.1 C) 98.8 F (37.1 C)  98.6 F (37 C)  TempSrc:  Oral  Oral  SpO2:   98% 100%  Weight:    103 kg (227 lb 1.2 oz)  Height:        Intake/Output Summary (Last 24 hours) at 06/04/2017 1204 Last data filed at 06/04/2017 1137 Gross per 24 hour  Intake 240 ml  Output 4125 ml  Net -3885 ml   Filed Weights   06/02/17 0455 06/02/17 1605 06/04/17 0750  Weight: 102.9 kg (226 lb 13.7 oz) 102.9 kg (226 lb 13.7 oz) 103 kg (227 lb 1.2 oz)    Examination:  General exam: Not in distress, lying in bed comfortable Respiratory system: Clear bilateral, respiratory effort normal Cardiovascular system: Regular rate rhythm S1-S2 normal.  No pedal edema Gastrointestinal system: Abdomen soft, nontender.  Bowel some positive Central nervous system: Alert and oriented. No focal neurological deficits. Extremities: Left thigh has a wound VAC, no sign of bleeding.  Legs are warm and has good circulation. Skin: No rashes, lesions or ulcers Psychiatry: Judgement and insight appear normal. Mood & affect appropriate.     Data Reviewed: I have personally reviewed following labs and imaging studies  CBC: Recent Labs  Lab 05/31/17 1618 06/01/17 0415  06/01/17 2226 06/02/17 0825 06/02/17 1700 06/02/17 2223 06/04/17 0404  WBC 9.5 7.6 9.9  --   --  12.1* 8.3  NEUTROABS 7.5  --   --   --   --   --   --   HGB 7.0* 5.0* 6.9* 5.5* 8.2* 12.1 10.3*  HCT 22.1* 15.5* 20.9* 16.7* 24.0* 33.4* 31.5*  MCV 79.5 77.9* 81.0  --   --  82.1 83.3  PLT 322 240 217  --   --  197 161   Basic Metabolic Panel: Recent Labs  Lab 05/31/17 1618 06/01/17 0415 06/01/17 2226 06/02/17 1700  NA 142 140 139 145  K 4.2 3.7 3.9 4.0  CL 107 107 109  --   CO2 24 26 24   --   GLUCOSE 89 76 207* 80  BUN 47* 48* 47*  --   CREATININE 4.99* 4.67* 4.72*  --   CALCIUM 9.0 8.3* 8.1*  --    GFR: Estimated Creatinine Clearance: 11.2 mL/min (A) (by C-G formula based on SCr of 4.72 mg/dL (H)). Liver Function Tests: Recent Labs  Lab 05/31/17 1618  AST 17  ALT 8*  ALKPHOS 77  BILITOT 0.8  PROT 6.6  ALBUMIN 2.9*   No results for input(s): LIPASE, AMYLASE in the last 168 hours. No results for input(s): AMMONIA in the last 168 hours. Coagulation Profile: Recent Labs  Lab 05/31/17 0012 05/31/17 2306 06/01/17 0415 06/01/17 2226  INR 4.00 3.89 3.86 1.36   Cardiac Enzymes: No results for input(s): CKTOTAL, CKMB, CKMBINDEX, TROPONINI in the last 168 hours. BNP (last 3 results) No results for input(s): PROBNP in the last 8760 hours. HbA1C: No results for input(s): HGBA1C in the last 72 hours. CBG: Recent Labs  Lab 06/03/17 0817 06/03/17 1759 06/03/17 2212 06/04/17 0752 06/04/17 1136  GLUCAP 92 100* 193* 95 108*   Lipid Profile: No results for input(s): CHOL, HDL, LDLCALC, TRIG, CHOLHDL, LDLDIRECT in the last 72 hours. Thyroid Function Tests: No results for input(s): TSH, T4TOTAL, FREET4, T3FREE, THYROIDAB in the last 72 hours. Anemia Panel: No results for input(s): VITAMINB12, FOLATE, FERRITIN, TIBC, IRON, RETICCTPCT in the last 72 hours. Sepsis Labs: Recent Labs  Lab 05/31/17 0012 05/31/17 1631  PROCALCITON 0.16  --   LATICACIDVEN  --  1.74     Recent Results (from the past 240 hour(s))  Culture, blood (Routine X 2) w Reflex to ID Panel     Status: None (Preliminary result)   Collection Time: 05/31/17 10:23 PM  Result Value Ref Range Status   Specimen Description BLOOD RIGHT ARM  Final   Special Requests IN PEDIATRIC BOTTLE Blood Culture adequate volume  Final   Culture NO GROWTH 2 DAYS  Final   Report Status PENDING  Incomplete  Culture, blood (Routine X 2) w Reflex to ID Panel     Status: None (Preliminary result)   Collection Time: 05/31/17 10:45 PM  Result Value Ref Range Status   Specimen Description BLOOD RIGHT HAND  Final   Special Requests IN PEDIATRIC BOTTLE Blood Culture adequate volume  Final   Culture NO GROWTH 2 DAYS  Final   Report Status PENDING  Incomplete  MRSA PCR Screening     Status:  None   Collection Time: 06/01/17  7:14 PM  Result Value Ref Range Status   MRSA by PCR NEGATIVE NEGATIVE Final    Comment:        The GeneXpert MRSA Assay (FDA approved for NASAL specimens only), is one component of a comprehensive MRSA colonization surveillance program. It is not intended to diagnose MRSA infection nor to guide or monitor treatment for MRSA infections.          Radiology Studies: Dg Chest Port 1 View  Result Date: 06/02/2017 CLINICAL DATA:  Central line placement. EXAM: PORTABLE CHEST 1 VIEW COMPARISON:  Radiograph 04/25/2017 FINDINGS: New right internal jugular central venous catheter tip in the mid SVC. Unchanged right chest port with tip in the mid SVC. No pneumothorax. Lower lung volumes from prior exam leading to bronchovascular crowding. No overt pulmonary edema. Unchanged cardiomegaly and mediastinal contours. Mild bibasilar atelectasis. No large pleural effusion. IMPRESSION: 1. New right internal jugular central venous catheter tip in the mid SVC. No pneumothorax. 2. Lower lung volumes with bronchovascular crowding and bibasilar atelectasis. Electronically Signed   By: Jeb Levering  M.D.   On: 06/02/2017 20:02        Scheduled Meds: . carvedilol  6.25 mg Oral BID WC  . docusate sodium  100 mg Oral BID  . doxercalciferol  4 mcg Intravenous Q M,W,F-HD  . gabapentin  300 mg Oral TID  . insulin aspart  0-5 Units Subcutaneous QHS  . insulin aspart  0-9 Units Subcutaneous TID WC  . insulin glargine  3 Units Subcutaneous QHS  . midodrine  10 mg Oral Q M,W,F-HD  . multivitamin  1 tablet Oral QHS  . pantoprazole  40 mg Oral Daily  . PARoxetine  10 mg Oral Daily  . simvastatin  40 mg Oral q1800  . sucralfate  1 g Oral TID WC & HS  . warfarin  5 mg Oral ONCE-1800  . Warfarin - Pharmacist Dosing Inpatient   Does not apply q1800   Continuous Infusions: . sodium chloride    .  ceFAZolin (ANCEF) IV Stopped (06/03/17 2146)  . ferric gluconate (FERRLECIT/NULECIT) IV       LOS: 4 days    Ora Bollig Tanna Furry, MD [facility] Pager [phone]  If 7PM-7AM, please contact night-coverage [email or web address] Password [email or web address] 06/04/2017, 12:04 PM

## 2017-06-04 NOTE — Evaluation (Signed)
Physical Therapy Evaluation Patient Details Name: Ashlee Miles MRN: [id number] DOB: [birth year] Today's Date: 06/04/2017   History of Present Illness  Ashlee Miles is a 76 y.o. 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 Pt is s/p THROMBECTOMY AND REVISION OF ARTERIOVENTOUS (AV) GORETEX  GRAFT LEFT THIGH (Left) as a surgical intervention as well as evacuation of hematoma with VAC placement.   Clinical Impression  Pt admitted with above diagnosis. Pt currently with functional limitations due to the deficits listed below (see PT Problem List). Pt was able to transfer to recliner with mod assist of 2 persons using RW.  Pt with pain limiting mobility.  Will  Most likely need SNF.  Will follo w acutely. Pt will benefit from skilled PT to increase their independence and safety with mobility to allow discharge to the venue listed below.      Follow Up Recommendations SNF;Supervision/Assistance - 24 hour    Equipment Recommendations  None recommended by PT    Recommendations for Other Services       Precautions / Restrictions Precautions Precautions: Fall Precaution Comments: wound vac LLE Restrictions Weight Bearing Restrictions: No      Mobility  Bed Mobility Overal bed mobility: Needs Assistance Bed Mobility: Supine to Sit     Supine to sit: Mod assist;+2 for physical assistance;+2 for safety/equipment;HOB elevated(heavy mod)     General bed mobility comments: mod A to bring legs EOB; and for trunk elevation  Transfers Overall transfer level: Needs assistance Equipment used: Rolling walker (2 wheeled) Transfers: Sit to/from [facility] Sit to Stand: Mod assist;+2 physical assistance;+2 safety/equipment Stand  pivot transfers: Mod assist;+2 physical assistance;+2 safety/equipment       General transfer comment: Pt able to power up with mod A +2, fatigues very quickly, transferred to right today  Ambulation/Gait                Stairs            Wheelchair Mobility    Modified Rankin (Stroke Patients Only)       Balance Overall balance assessment: Needs assistance Sitting-balance support: Feet supported Sitting balance-[name]: Good Sitting balance - Comments: sitting EOB with no back support   Standing balance support: Bilateral upper extremity supported Standing balance-[name]: Poor Standing balance comment: reliant on RW and external support from therapists                             Pertinent Vitals/Pain Pain Assessment: Faces Faces Pain Scale: Hurts even more Pain Location: L Hip and LE  Pain Descriptors / Indicators: Discomfort;Grimacing;Sore;Tender Pain Intervention(s): Limited activity within patient's tolerance;Monitored during session;Repositioned;Premedicated before session  VSS  Home Living Family/patient expects to be discharged to:: Private residence Living Arrangements: Children Available Help at Discharge: Family;Personal care attendant(PCA M-F 3.5 ) Type of Home: House Home Access: Stairs to enter Entrance Stairs-Rails: Psychiatric nurse of Steps: 3 Home Layout: One level Home Equipment: Toilet riser;Walker - 4 wheels;Shower seat      Prior Function Level of Independence: Needs assistance   Gait / Transfers Assistance Needed: using RW for mobility  ADL's / Homemaking Assistance Needed: bathing and dressing by PCA        Hand Dominance   Dominant  Hand: Right    Extremity/Trunk Assessment   Upper Extremity Assessment Upper Extremity Assessment: Overall WFL for tasks assessed    Lower Extremity Assessment Lower Extremity Assessment: LLE deficits/detail LLE Deficits / Details: deficits in ROM  and strength post-op LLE: Unable to fully assess due to pain LLE Coordination: decreased gross motor       Communication   Communication: No difficulties  Cognition Arousal/Alertness: Awake/alert Behavior During Therapy: WFL for tasks assessed/performed Overall Cognitive Status: Within Functional Limits for tasks assessed                                        General Comments General comments (skin integrity, edema, etc.): VAC in place left thign    Exercises     Assessment/Plan    PT Assessment    PT Problem List         PT Treatment Interventions      PT Goals (Current goals can be found in the Care Plan section)  Acute Rehab PT Goals Patient Stated Goal: to get better    Frequency Min 3X/week   Barriers to discharge        Co-evaluation PT/OT/SLP Co-Evaluation/Treatment: Yes Reason for Co-Treatment: Complexity of the patient's impairments (multi-system involvement) PT goals addressed during session: Mobility/safety with mobility OT goals addressed during session: ADL's and self-care       AM-PAC PT "6 Clicks" Daily Activity  Outcome Measure Difficulty turning over in bed (including adjusting bedclothes, sheets and blankets)?: Unable Difficulty moving from lying on back to sitting on the side of the bed? : Unable Difficulty sitting down on and standing up from a chair with arms (e.g., wheelchair, bedside commode, etc,.)?: Unable Help needed moving to and from a bed to chair (including a wheelchair)?: Total Help needed walking in hospital room?: Total Help needed climbing 3-5 steps with a railing? : Total 6 Click Score: 6    End of Session Equipment Utilized During Treatment: Gait belt Activity Tolerance: Patient limited by fatigue;Patient limited by pain Patient left: in chair;with call bell/phone within reach;with chair alarm set Nurse Communication: Mobility status PT Visit Diagnosis: Unsteadiness on feet (R26.81);Muscle weakness  (generalized) (M62.81);Pain Pain - Right/Left: Left Pain - part of body: Leg    Time: 2023-3435 PT Time Calculation (min) (ACUTE ONLY): 14 min   Charges:   PT Evaluation $PT Eval Moderate Complexity: 1 Mod     PT G Codes:        [place] Ashlee Miles,PT Acute Rehabilitation [phone] (pager)   Denice Paradise 06/04/2017, 1:17 PM

## 2017-06-04 NOTE — Progress Notes (Addendum)
[facility] Progress Note  Subjective: feeling better, had HD y est  Vitals:   06/04/17 0047 06/04/17 0359 06/04/17 0400 06/04/17 0750  BP:   (!) 106/50 (!) 101/53  Pulse:   71 71  Resp:   11   Temp: 98.8 F (37.1 C) 98.8 F (37.1 C)  98.6 F (37 C)  TempSrc:  Oral  Oral  SpO2:   98% 100%  Weight:    103 kg (227 lb 1.2 oz)  Height:        Inpatient medications: . carvedilol  6.25 mg Oral BID WC  . docusate sodium  100 mg Oral BID  . doxercalciferol  4 mcg Intravenous Q M,W,F-HD  . gabapentin  300 mg Oral TID  . insulin aspart  0-5 Units Subcutaneous QHS  . insulin aspart  0-9 Units Subcutaneous TID WC  . insulin glargine  3 Units Subcutaneous QHS  . midodrine  10 mg Oral Q M,W,F-HD  . multivitamin  1 tablet Oral QHS  . pantoprazole  40 mg Oral Daily  . PARoxetine  10 mg Oral Daily  . simvastatin  40 mg Oral q1800  . sucralfate  1 g Oral TID WC & HS  . warfarin  5 mg Oral ONCE-1800  . Warfarin - Pharmacist Dosing Inpatient   Does not apply q1800   . sodium chloride    . ferric gluconate (FERRLECIT/NULECIT) IV     acetaminophen, camphor-menthol, feeding supplement (NEPRO CARB STEADY), guaiFENesin, hydrALAZINE, hydrOXYzine, linaclotide, methocarbamol, morphine injection, ondansetron **OR** ondansetron (ZOFRAN) IV, oxyCODONE-acetaminophen, phenol, senna-docusate, sodium chloride flush, triamcinolone cream, zolpidem  Exam: Obese AAF, no distress No jvd Chest clear  RRR no mrg ABd soft obese ntnd Ext L thigh marked edema, very tender. No RLE edema.  NF, ox 3   L thigh AVG +bruit   Home meds:  -lantus/ novolog -midodrine 10 mg pre HD MWF / coreg 6.25 bid -coumadin/ PPI/ H2B/ carafate 1gm qid/ statin -paxil/ oxy IR/ neurontin 300 tid/ norco prn -prn miralax/ flexeril/ colace/ atarax/ linzess/ robaxin/ duoneb    Dialysis: [facility] MWF 4h   3K/2.25 bath   98.5kg   Hep 3000   L thigh AVG -hect 4  -Fe thru 12/14  -mircera 150 last dose 11/2       Impression: 1. L thigh graft bleed / hematoma - sp hematoma evac 12/14. OK to use AV graft lateral limb 2. ESRD - HD MWF. K on low side. No heparin for the next several days.  3. Prot C def - on coumadin at home 4. Anemia of CKD/ acute blood loss - better, Hb up 8- 10 range ,will give darbe 100 ug on Monday w HD 5. MBD of CKD - cont meds 6. Volume - is up 3-4 kg 7. Chronic pain syndrome   Plan - HD Monday, max UF as Ronnie Derby MD [place] pager [phone]   06/04/2017, 12:27 PM   Recent Labs  Lab 05/31/17 1618 06/01/17 0415 06/01/17 2226 06/02/17 1700  NA 142 140 139 145  K 4.2 3.7 3.9 4.0  CL 107 107 109  --   CO2 24 26 24   --   GLUCOSE 89 76 207* 80  BUN 47* 48* 47*  --   CREATININE 4.99* 4.67* 4.72*  --   CALCIUM 9.0 8.3* 8.1*  --    Recent Labs  Lab 05/31/17 1618  AST 17  ALT 8*  ALKPHOS 77  BILITOT 0.8  PROT 6.6  ALBUMIN 2.9*   Recent Labs  Lab 05/31/17 1618  06/01/17 2226  06/02/17 1700 06/02/17 2223 06/04/17 0404  WBC 9.5   < > 9.9  --   --  12.1* 8.3  NEUTROABS 7.5  --   --   --   --   --   --   HGB 7.0*   < > 6.9*   < > 8.2* 12.1 10.3*  HCT 22.1*   < > 20.9*   < > 24.0* 33.4* 31.5*  MCV 79.5   < > 81.0  --   --  82.1 83.3  PLT 322   < > 217  --   --  197 202   < > = values in this interval not displayed.   Iron/TIBC/Ferritin/ %Sat    Component Value Date/Time   IRON 11 (L) 05/31/2017 2306   TIBC 141 (L) 05/31/2017 2306   FERRITIN 647 (H) 05/31/2017 2306   IRONPCTSAT 8 (L) 05/31/2017 2306

## 2017-06-04 NOTE — Progress Notes (Signed)
ANTICOAGULATION CONSULT NOTE - Initial Consult  Pharmacy Consult for warfarin Indication: hx DVT, protein C deficiency  No Known Allergies  Patient Measurements: Height: 5' 0.98" (154.9 cm) Weight: 226 lb 13.7 oz (102.9 kg) IBW/kg (Calculated) : 47.76   Vital Signs: Temp: 98.6 F (37 C) (12/16 0750) Temp Source: Oral (12/16 0750) BP: 101/53 (12/16 0750) Pulse Rate: 71 (12/16 0750)  Labs: Recent Labs    06/01/17 2226  06/02/17 1700 06/02/17 2223 06/04/17 0404  HGB 6.9*   < > 8.2* 12.1 10.3*  HCT 20.9*   < > 24.0* 33.4* 31.5*  PLT 217  --   --  197 202  LABPROT 16.7*  --   --   --   --   INR 1.36  --   --   --   --   CREATININE 4.72*  --   --   --   --    < > = values in this interval not displayed.    Estimated Creatinine Clearance: 11.2 mL/min (A) (by C-G formula based on SCr of 4.72 mg/dL (H)).   Medical History: Past Medical History:  Diagnosis Date  . CHF (congestive heart failure) ([place])   . Diabetes mellitus without complication ([place])   . ESRD (end stage renal disease) on dialysis ([place])   . History of pituitary adenoma   . Hypertension   . Protein C deficiency ([place])     Medications:  Scheduled:  . carvedilol  6.25 mg Oral BID WC  . docusate sodium  100 mg Oral BID  . doxercalciferol  4 mcg Intravenous Q M,W,F-HD  . gabapentin  300 mg Oral TID  . insulin aspart  0-5 Units Subcutaneous QHS  . insulin aspart  0-9 Units Subcutaneous TID WC  . insulin glargine  3 Units Subcutaneous QHS  . midodrine  10 mg Oral Q M,W,F-HD  . multivitamin  1 tablet Oral QHS  . pantoprazole  40 mg Oral Daily  . PARoxetine  10 mg Oral Daily  . simvastatin  40 mg Oral q1800  . sucralfate  1 g Oral TID WC & HS  . warfarin  5 mg Oral ONCE-1800  . Warfarin - Pharmacist Dosing Inpatient   Does not apply q1800   Infusions:  . sodium chloride    .  ceFAZolin (ANCEF) IV Stopped (06/03/17 2146)  . ferric gluconate (FERRLECIT/NULECIT) IV      Assessment: 76 y/o female on  warfarin PTA for protein C deficiency, remote hx DVT on dialysis presented with L groin infection and hematoma of left thigh dialysis graft. Warfarin was initially held with INR of 4, s/p IV vitamin K, FFP and tranfusions INR was 1.36 on 12/13. Patient went to OR 12/14 and hematoma was successfully evacuated, VVS inspected wound and found no active bleeding. HGB nadir 5.5, s/p transfusions and wound evac trended up to 12.1. Pharmacy consulted to dose warfarin.  Home warfarin dose: 5 mg daily   Goal of Therapy:  INR 2-3 Monitor platelets by anticoagulation protocol: Yes   Plan:  Warfarin 5 mg PO x 1 Daily INR Monitor s/sx of bleeding   Charlene Brooke, PharmD PGY1 Pharmacy Resident Phone: [phone] After 3:30PM please call [place] #[phone]= 06/04/2017,9:13 AM

## 2017-06-04 NOTE — Progress Notes (Signed)
Patient tolerated sitting in bedside chair today for about 4 hrs without difficulty though exhausted once back in the bed. Patient hopeful surgery will be performed tomorrow on her site so the pain will be decreased.  Patient tolereated pain med IV for  longer interveral.

## 2017-06-04 NOTE — Evaluation (Signed)
Occupational Therapy Evaluation Patient Details Name: Ashlee Miles MRN: [id number] DOB: [birth year] Today's Date: 06/04/2017    History of Present Illness Ashlee Miles is a 76 y.o. 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 Pt is s/p THROMBECTOMY AND REVISION OF ARTERIOVENTOUS (AV) GORETEX  GRAFT LEFT THIGH (Left) as a surgical intervention.   Clinical Impression   PTA Pt used RW for in home mobility and has a PCA that assisted with bathing/dressing. Pt is currently mod +2 for bed mobility and stand pivot transfer, max A for LB ADL and set up for UB feeding/grooming tasks. Please see full OT problem list below. Pt will require skilled OT in the acute setting and afterwards at SNF level. Pt has had 6 admissions in the last 6 months, the most recent one, she declined SNF preferring to go home - I feel the safest place would be SNF to maximize her safety and independence in ADL and functional transfers to get her back at a level where her daughter and PCA can assist her. Next session to address LB ADL and transfers to [facility].     Follow Up Recommendations  SNF;Supervision/Assistance - 24 hour    Equipment Recommendations  Other (comment)(defer to next venue; Pt will require wide/bariatric DME)    Recommendations for Other Services       Precautions / Restrictions Precautions Precautions: Fall Precaution Comments: wound vac LLE Restrictions Weight Bearing Restrictions: No      Mobility Bed Mobility Overal bed mobility: Needs Assistance Bed Mobility: Supine to Sit     Supine to sit: Mod assist;+2 for physical assistance;+2 for safety/equipment;HOB elevated(heavy mod)     General bed mobility comments: mod A to bring legs EOB; and  for trunk elevation  Transfers Overall transfer level: Needs assistance Equipment used: Rolling walker (2 wheeled) Transfers: Sit to/from [facility] Sit to Stand: Mod assist;+2 physical assistance;+2 safety/equipment Stand pivot transfers: Mod assist;+2 physical assistance;+2 safety/equipment       General transfer comment: Pt able to power up with mod A +2, fatigues very quickly, transferred to right today    Balance Overall balance assessment: Needs assistance Sitting-balance support: Feet supported Sitting balance-[name]: Good Sitting balance - Comments: sitting EOB with no back support   Standing balance support: Bilateral upper extremity supported Standing balance-[name]: Poor Standing balance comment: reliant on RW and external support from therapists                           ADL either performed or assessed with clinical judgement   ADL Overall ADL's : Needs assistance/impaired Eating/Feeding: Set up   Grooming: Wash/dry hands;Wash/dry face;Set up;Sitting Grooming Details (indicate cue type and reason): EOB Upper Body Bathing: Minimal assistance   Lower Body Bathing: Maximal assistance   Upper Body Dressing : Set up   Lower Body Dressing: Maximal assistance;+2 for safety/equipment;Sit to/from stand Lower Body Dressing Details (indicate cue type and reason): total A to don socks this session Toilet Transfer: Moderate assistance;Stand-pivot;+2 for physical assistance;+2 for safety/equipment;RW Toilet Transfer Details (indicate cue type and reason): simulated through recliner transfer [place] and Hygiene: Maximal assistance       Functional mobility during ADLs: Moderate assistance;+2 for physical assistance;+2 for safety/equipment;Rolling walker;Cueing for sequencing  Vision Patient Visual Report: No change from baseline       Perception     Praxis      Pertinent Vitals/Pain Pain  Assessment: Faces Faces Pain Scale: Hurts even more Pain Location: L Hip and LE  Pain Descriptors / Indicators: Discomfort;Grimacing;Sore;Tender Pain Intervention(s): Limited activity within patient's tolerance;Monitored during session;Premedicated before session;Repositioned     Hand Dominance Right   Extremity/Trunk Assessment Upper Extremity Assessment Upper Extremity Assessment: Overall WFL for tasks assessed   Lower Extremity Assessment Lower Extremity Assessment: LLE deficits/detail LLE Deficits / Details: deficits in ROM and strength post-op LLE: Unable to fully assess due to pain LLE Coordination: decreased gross motor       Communication Communication Communication: No difficulties   Cognition Arousal/Alertness: Awake/alert Behavior During Therapy: WFL for tasks assessed/performed Overall Cognitive Status: Within Functional Limits for tasks assessed                                     General Comments  Pt stated "Everytime I'm about to start therapy at home I end up back here"    Exercises     Shoulder Instructions      Home Living Family/patient expects to be discharged to:: Private residence Living Arrangements: Children Available Help at Discharge: Family;Personal care attendant(PCA M-F 3.5 ) Type of Home: House Home Access: Stairs to enter [employer] of Steps: 3 Entrance Stairs-Rails: Right;Left Home Layout: One level     Bathroom Shower/Tub: Corporate investment banker: Standard Bathroom Accessibility: Yes How Accessible: Accessible via walker Home Equipment: Toilet riser;Walker - 4 wheels;Shower seat          Prior Functioning/Environment Level of Independence: Needs assistance  Gait / Transfers Assistance Needed: using RW for mobility ADL's / Homemaking Assistance Needed: bathing and dressing by PCA            OT Problem List: Decreased strength;Decreased range of motion;Decreased activity  tolerance;Impaired balance (sitting and/or standing);Decreased safety awareness;Decreased knowledge of use of DME or AE;Decreased knowledge of precautions;Cardiopulmonary status limiting activity;Obesity;Pain;Increased edema      OT Treatment/Interventions: Self-care/ADL training;DME and/or AE instruction;Therapeutic exercise;Energy conservation;Patient/family education;Balance training;Therapeutic activities    OT Goals(Current goals can be found in the care plan section) Acute Rehab OT Goals Patient Stated Goal: to get better OT Goal Formulation: With patient Time For Goal Achievement: 06/18/17 Potential to Achieve Goals: Fair ADL Goals Pt Will Perform Lower Body Bathing: with min assist;with caregiver independent in assisting;sitting/lateral leans Pt Will Perform Lower Body Dressing: with mod assist;sit to/from stand;with caregiver independent in assisting Pt Will Transfer to Toilet: with min guard assist;stand pivot transfer;bedside commode Pt Will Perform Toileting - Clothing Manipulation and hygiene: with min guard assist;with caregiver independent in assisting;sitting/lateral leans Additional ADL Goal #1: Pt will perform bed mobility at supervision level prior to initiating ADL activity  OT Frequency: Min 2X/week   Barriers to D/C:            Co-evaluation PT/OT/SLP Co-Evaluation/Treatment: Yes Reason for Co-Treatment: Complexity of the patient's impairments (multi-system involvement);For patient/therapist safety;To address functional/ADL transfers PT goals addressed during session: Mobility/safety with mobility OT goals addressed during session: ADL's and self-care      AM-PAC PT "6 Clicks" Daily Activity     Outcome Measure Help from another person eating meals?: None Help from another person taking care of personal grooming?: None Help from another person toileting, which includes using toliet,  bedpan, or urinal?: A Lot Help from another person bathing (including washing,  rinsing, drying)?: A Lot Help from another person to put on and taking off regular upper body clothing?: A Little Help from another person to put on and taking off regular lower body clothing?: A Lot 6 Click Score: 17   End of Session Equipment Utilized During Treatment: Gait belt;Rolling walker Nurse Communication: Mobility status(Pt's purewick removed for transfer)  Activity Tolerance: Patient tolerated treatment well Patient left: in chair;with call bell/phone within reach;with chair alarm set  OT Visit Diagnosis: Unsteadiness on feet (R26.81);Other abnormalities of gait and mobility (R26.89);History of falling (Z91.81);Pain Pain - Right/Left: Left Pain - part of body: Leg                Time: 0102-7253 OT Time Calculation (min): 24 min Charges:  OT General Charges $OT Visit: 1 Visit OT Evaluation $OT Eval Moderate Complexity: 1 Mod G-Codes:     Hulda Humphrey OTR/L [place] 06/04/2017, 12:39 PM

## 2017-06-05 ENCOUNTER — Encounter (HOSPITAL_COMMUNITY): Payer: Self-pay | Admitting: Surgery

## 2017-06-05 DIAGNOSIS — G9341 Metabolic encephalopathy: Secondary | ICD-10-CM

## 2017-06-05 LAB — CBC
HCT: 31.3 % — ABNORMAL LOW (ref 36.0–46.0)
Hemoglobin: 10.2 g/dL — ABNORMAL LOW (ref 12.0–15.0)
MCH: 27.8 pg (ref 26.0–34.0)
MCHC: 32.6 g/dL (ref 30.0–36.0)
MCV: 85.3 fL (ref 78.0–100.0)
Platelets: 206 10*3/uL (ref 150–400)
RBC: 3.67 MIL/uL — ABNORMAL LOW (ref 3.87–5.11)
RDW: 16.1 % — ABNORMAL HIGH (ref 11.5–15.5)
WBC: 6.7 10*3/uL (ref 4.0–10.5)

## 2017-06-05 LAB — BASIC METABOLIC PANEL
Anion gap: 10 (ref 5–15)
BUN: 37 mg/dL — ABNORMAL HIGH (ref 6–20)
CO2: 27 mmol/L (ref 22–32)
Calcium: 8.9 mg/dL (ref 8.9–10.3)
Chloride: 99 mmol/L — ABNORMAL LOW (ref 101–111)
Creatinine, Ser: 4.69 mg/dL — ABNORMAL HIGH (ref 0.44–1.00)
GFR calc Af Amer: 10 mL/min — ABNORMAL LOW (ref >60)
GFR calc non Af Amer: 8 mL/min — ABNORMAL LOW (ref >60)
Glucose, Bld: 191 mg/dL — ABNORMAL HIGH (ref 65–99)
Potassium: 4.2 mmol/L (ref 3.5–5.1)
Sodium: 136 mmol/L (ref 135–145)

## 2017-06-05 LAB — GLUCOSE, CAPILLARY
Glucose-Capillary: 174 mg/dL — ABNORMAL HIGH (ref 65–99)
Glucose-Capillary: 164 mg/dL — ABNORMAL HIGH (ref 65–99)
Glucose-Capillary: 93 mg/dL (ref 65–99)
Glucose-Capillary: 149 mg/dL — ABNORMAL HIGH (ref 65–99)

## 2017-06-05 LAB — PROTIME-INR
INR: 1.19
Prothrombin Time: 15 seconds (ref 11.4–15.2)

## 2017-06-05 LAB — HEPARIN LEVEL (UNFRACTIONATED): Heparin Unfractionated: 0.33 IU/mL (ref 0.30–0.70)

## 2017-06-05 MED ORDER — PENTAFLUOROPROP-TETRAFLUOROETH EX AERO: 1.0000 "application " | INHALATION_SPRAY | CUTANEOUS | Status: DC | PRN

## 2017-06-05 MED ORDER — LIDOCAINE-PRILOCAINE 2.5-2.5 % EX CREA: 1.0000 "application " | TOPICAL_CREAM | CUTANEOUS | Status: DC | PRN

## 2017-06-05 MED ORDER — GABAPENTIN 300 MG PO CAPS
300.0000 mg | ORAL_CAPSULE | Freq: Every day | ORAL | Status: DC
  Administered 2017-06-06 – 2017-06-08 (×3): 300 mg via ORAL
  Filled 2017-06-05 (×3): qty 1

## 2017-06-05 MED ORDER — SODIUM CHLORIDE 0.9 % IV SOLN: 100.0000 mL | INTRAVENOUS | Status: DC | PRN

## 2017-06-05 MED ORDER — HYDROMORPHONE HCL 1 MG/ML IJ SOLN: 1.0000 mg | INTRAMUSCULAR | Status: DC | PRN

## 2017-06-05 MED ORDER — HEPARIN (PORCINE) IN NACL 100-0.45 UNIT/ML-% IJ SOLN
1100.0000 [IU]/h | INTRAMUSCULAR | Status: DC
  Administered 2017-06-05 – 2017-06-07 (×3): 1000 [IU]/h via INTRAVENOUS
  Administered 2017-06-08: 1100 [IU]/h via INTRAVENOUS
  Filled 2017-06-05 (×4): qty 250

## 2017-06-05 MED ORDER — HEPARIN SODIUM (PORCINE) 1000 UNIT/ML DIALYSIS: 1000.0000 [IU] | INTRAMUSCULAR | Status: DC | PRN

## 2017-06-05 MED ORDER — ALTEPLASE 2 MG IJ SOLR: 2.0000 mg | Freq: Once | INTRAMUSCULAR | Status: DC | PRN

## 2017-06-05 MED ORDER — WARFARIN SODIUM 7.5 MG PO TABS
7.5000 mg | ORAL_TABLET | Freq: Once | ORAL | Status: AC
  Administered 2017-06-06: 7.5 mg via ORAL
  Filled 2017-06-05: qty 1

## 2017-06-05 MED ORDER — NALOXONE HCL 0.4 MG/ML IJ SOLN
0.4000 mg | Freq: Once | INTRAMUSCULAR | Status: AC
  Administered 2017-06-05 – 2017-06-06 (×2): 0.4 mg via INTRAVENOUS
  Filled 2017-06-05: qty 1

## 2017-06-05 MED ORDER — LIDOCAINE HCL (PF) 1 % IJ SOLN: 5.0000 mL | INTRAMUSCULAR | Status: DC | PRN

## 2017-06-05 NOTE — Progress Notes (Signed)
[facility] ROUNDING NOTE   Subjective:   Interval History:  76 year old female with history of hypertension, diabetes, ESRD on hemodialysis, DVT, protein C deficiency on Coumadin, pituitary adenoma, chronic diastolic congestive heart failure, GERD, depression presented with left groin pain and left leg swelling found to have left groin hematoma.  Patient underwent hematoma evacuation  : NW MWF 4h   3K/2.25 bath   98.5kg   Hep 3000   L thigh AVG -hect 4  -Fe thru 12/14  -mircera 150 last dose 11/2      Objective:  Vital signs in last 24 hours:  Temp:  [97.6 F (36.4 C)-98.8 F (37.1 C)] 98.4 F (36.9 C) (12/17 0801) Pulse Rate:  [65-75] 65 (12/17 0400) Resp:  [10-32] 15 (12/17 0400) BP: (91-133)/(51-76) 119/60 (12/17 0400) SpO2:  [92 %-99 %] 99 % (12/17 0400) Weight:  [224 lb 13.9 oz (102 kg)] 224 lb 13.9 oz (102 kg) (12/17 0500)  Weight change:  Filed Weights   06/02/17 1605 06/04/17 0750 06/05/17 0500  Weight: 226 lb 13.7 oz (102.9 kg) 227 lb 1.2 oz (103 kg) 224 lb 13.9 oz (102 kg)    Intake/Output: I/O last 3 completed shifts: In: 242 [P.O.:835; I.V.:10] Out: 625 [Urine:300; Drains:325]   Intake/Output this shift:  No intake/output data recorded.  Obese AAF, no distress No jvd Chest clear  RRR no mrg ABd soft obese ntnd Ext L thigh marked edema, very tender. No RLE edema.  NF, ox 3   L thigh AVG +bruit     Basic Metabolic Panel: Recent Labs  Lab 05/31/17 1618 06/01/17 0415 06/01/17 2226 06/02/17 1700 06/04/17 1123 06/05/17 0400  NA 142 140 139 145 135 136  K 4.2 3.7 3.9 4.0 3.9 4.2  CL 107 107 109  --  101 99*  CO2 24 26 24   --  26 27  GLUCOSE 89 76 207* 80 140* 191*  BUN 47* 48* 47*  --  31* 37*  CREATININE 4.99* 4.67* 4.72*  --  3.82* 4.69*  CALCIUM 9.0 8.3* 8.1*  --  8.5* 8.9  PHOS  --   --   --   --  3.2  --     Liver Function Tests: Recent Labs  Lab 05/31/17 1618 06/04/17 1123  AST 17  --   ALT 8*  --   ALKPHOS 77   --   BILITOT 0.8  --   PROT 6.6  --   ALBUMIN 2.9* 2.4*   No results for input(s): LIPASE, AMYLASE in the last 168 hours. No results for input(s): AMMONIA in the last 168 hours.  CBC: Recent Labs  Lab 05/31/17 1618 06/01/17 0415 06/01/17 2226 06/02/17 0825 06/02/17 1700 06/02/17 2223 06/04/17 0404 06/05/17 0400  WBC 9.5 7.6 9.9  --   --  12.1* 8.3 6.7  NEUTROABS 7.5  --   --   --   --   --   --   --   HGB 7.0* 5.0* 6.9* 5.5* 8.2* 12.1 10.3* 10.2*  HCT 22.1* 15.5* 20.9* 16.7* 24.0* 33.4* 31.5* 31.3*  MCV 79.5 77.9* 81.0  --   --  82.1 83.3 85.3  PLT 322 240 217  --   --  197 202 206    Cardiac Enzymes: No results for input(s): CKTOTAL, CKMB, CKMBINDEX, TROPONINI in the last 168 hours.  BNP: Invalid input(s): POCBNP  CBG: Recent Labs  Lab 06/04/17 0752 06/04/17 1136 06/04/17 1619 06/04/17 2122 06/05/17 0802  GLUCAP 95 108* 160*  133* 32*    Microbiology: Results for orders placed or performed during the hospital encounter of 05/31/17  Culture, blood (Routine X 2) w Reflex to ID Panel     Status: None (Preliminary result)   Collection Time: 05/31/17 10:23 PM  Result Value Ref Range Status   Specimen Description BLOOD RIGHT ARM  Final   Special Requests IN PEDIATRIC BOTTLE Blood Culture adequate volume  Final   Culture NO GROWTH 3 DAYS  Final   Report Status PENDING  Incomplete  Culture, blood (Routine X 2) w Reflex to ID Panel     Status: None (Preliminary result)   Collection Time: 05/31/17 10:45 PM  Result Value Ref Range Status   Specimen Description BLOOD RIGHT HAND  Final   Special Requests IN PEDIATRIC BOTTLE Blood Culture adequate volume  Final   Culture NO GROWTH 3 DAYS  Final   Report Status PENDING  Incomplete  MRSA PCR Screening     Status: None   Collection Time: 06/01/17  7:14 PM  Result Value Ref Range Status   MRSA by PCR NEGATIVE NEGATIVE Final    Comment:        The GeneXpert MRSA Assay (FDA approved for NASAL specimens only), is one  component of a comprehensive MRSA colonization surveillance program. It is not intended to diagnose MRSA infection nor to guide or monitor treatment for MRSA infections.     Coagulation Studies: Recent Labs    06/04/17 1123 06/05/17 0400  LABPROT 16.0* 15.0  INR 1.30 1.19    Urinalysis: No results for input(s): COLORURINE, LABSPEC, PHURINE, GLUCOSEU, HGBUR, BILIRUBINUR, KETONESUR, PROTEINUR, UROBILINOGEN, NITRITE, LEUKOCYTESUR in the last 72 hours.  Invalid input(s): APPERANCEUR    Imaging: No results found.   Medications:   . sodium chloride    . ferric gluconate (FERRLECIT/NULECIT) IV    . heparin 1,000 Units/hr (06/05/17 0846)   . carvedilol  6.25 mg Oral BID WC  . darbepoetin (ARANESP) injection - DIALYSIS  100 mcg Intravenous Q Mon-HD  . docusate sodium  100 mg Oral BID  . doxercalciferol  4 mcg Intravenous Q M,W,F-HD  . gabapentin  300 mg Oral TID  . insulin aspart  0-5 Units Subcutaneous QHS  . insulin aspart  0-9 Units Subcutaneous TID WC  . insulin glargine  3 Units Subcutaneous QHS  . midodrine  10 mg Oral Q M,W,F-HD  . multivitamin  1 tablet Oral QHS  . pantoprazole  40 mg Oral Daily  . PARoxetine  10 mg Oral Daily  . simvastatin  40 mg Oral q1800  . sucralfate  1 g Oral TID WC & HS  . warfarin  7.5 mg Oral ONCE-1800  . Warfarin - Pharmacist Dosing Inpatient   Does not apply q1800   acetaminophen, camphor-menthol, feeding supplement (NEPRO CARB STEADY), guaiFENesin, hydrALAZINE, HYDROmorphone (DILAUDID) injection, hydrOXYzine, linaclotide, methocarbamol, ondansetron **OR** ondansetron (ZOFRAN) IV, oxyCODONE-acetaminophen, phenol, senna-docusate, sodium chloride flush, triamcinolone cream, zolpidem  Assessment/ Plan:  1. L thigh graft bleed / hematoma - sp hematoma evac 12/14. OK to use AV graft lateral limb   2. ESRD - HD MWF. K on low side. No heparin for the next several days.  3. Prot C def - on coumadin at home 4. Anemia of CKD/ acute blood  loss - better, Hb up 8- 10 range ,will give darbe 100 ug on Monday w HD 5. MBD of CKD - cont meds 6. Volume - is up 3-4 kg Chronic pain syndrome  Patient planned for dialysis  this morning       LOS: 5 Ashlee Miles W @TODAY @10 :21 AM

## 2017-06-05 NOTE — Plan of Care (Signed)
Patient now understands the importance of medicating for pain only when necessary due to reaction she had this am from Morphine. Patient much more alert, calm and talking coherently.

## 2017-06-05 NOTE — Progress Notes (Signed)
Patient now more alert and speech more clear.  Complained of pain to upper left thigh and given (1) Oxycodone po.  Patietn ate 100% of dinner meal.

## 2017-06-05 NOTE — Progress Notes (Signed)
ANTICOAGULATION CONSULT NOTE - Follow-Up  Pharmacy Consult for Heparin Indication: hx DVT, protein C deficiency  No Known Allergies  Patient Measurements: Height: 5' 0.98" (154.9 cm) Weight: 224 lb 13.9 oz (102 kg) IBW/kg (Calculated) : 47.76  Heparin wt: 73 kg   Vital Signs: Temp: 98.2 F (36.8 C) (12/17 1608) Temp Source: Oral (12/17 1608) BP: 135/60 (12/17 1608) Pulse Rate: 69 (12/17 1608)  Labs: Recent Labs    06/02/17 2223 06/04/17 0404 06/04/17 1123 06/05/17 0400 06/05/17 1910  HGB 12.1 10.3*  --  10.2*  --   HCT 33.4* 31.5*  --  31.3*  --   PLT 197 202  --  206  --   LABPROT  --   --  16.0* 15.0  --   INR  --   --  1.30 1.19  --   HEPARINUNFRC  --   --   --   --  0.33  CREATININE  --   --  3.82* 4.69*  --     Estimated Creatinine Clearance: 11.2 mL/min (A) (by C-G formula based on SCr of 4.69 mg/dL (H)).   Medical History: Past Medical History:  Diagnosis Date  . CHF (congestive heart failure) ([place])   . Diabetes mellitus without complication ([place])   . ESRD (end stage renal disease) on dialysis ([place])   . History of pituitary adenoma   . Hypertension   . Protein C deficiency ([facility])     Infusions:  . sodium chloride    . heparin 1,000 Units/hr (06/05/17 7782)    Assessment: 77 y/o female on warfarin PTA for protein C deficiency, remote hx DVT on dialysis presented with L groin infection and hematoma of left thigh dialysis graft. Warfarin was initially held with INR of 4, s/p IV vitamin K, FFP and tranfusions INR was 1.36 on 12/13. Patient went to OR 12/14 and hematoma was successfully evacuated, VVS inspected wound and found no active bleeding. Hgb nadir 5.5, s/p transfusions and wound evac trended up to 12.1.PTA warfarin dose 5 mg daily. Pharmacy consulted to resume warfarin dosing on 12/16 with plans to now start a heparin bridge with no bolus given recent hematoma requiring evacuation.   INR today remains SUBtherapeutic (INR 1.19 << 1.3, goal of 2-3).  Hgb/Hct stable - no active bleeding noted. Hep Wt: 73 kg  Initial heparin level therapeutic at 0.33  Goal of Therapy:  INR 2-3 Monitor platelets by anticoagulation protocol: Yes   Plan:  Continue heparin at 1000 units / hr Follow up AM labs  Thank you Anette Guarneri, PharmD [phone] 06/05/2017 7:51 PM

## 2017-06-05 NOTE — Progress Notes (Signed)
Physical Therapy Treatment Patient Details Name: Ashlee Miles MRN: [id number] DOB: 23-Oct-1940 Today's Date: 06/05/2017    History of Present Illness Ashlee Miles is a 76 y.o. 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 Pt is s/p THROMBECTOMY AND REVISION OF ARTERIOVENTOUS (AV) GORETEX  GRAFT LEFT THIGH (Left) as a surgical intervention as well as evacuation of hematoma with VAC placement.     PT Comments    Pt admitted with above diagnosis. Pt currently with functional limitations due to the deficits listed below (see PT Problem List). Pt was able to stand pivot to recliner with RW with mod assist of 2 persons.  Could not progress due to lethargy from possibly too much medication earlier per nurse. Will continue to follow pt and progress as able.   Pt will benefit from skilled PT to increase their independence and safety with mobility to allow discharge to the venue listed below.     Follow Up Recommendations  SNF;Supervision/Assistance - 24 hour     Equipment Recommendations  None recommended by PT    Recommendations for Other Services       Precautions / Restrictions Precautions Precautions: Fall Precaution Comments: wound vac LLE Restrictions Weight Bearing Restrictions: No    Mobility  Bed Mobility Overal bed mobility: Needs Assistance Bed Mobility: Supine to Sit     Supine to sit: Mod assist;+2 for physical assistance;+2 for safety/equipment;HOB elevated(heavy mod)     General bed mobility comments: mod A to bring legs EOB; and for trunk elevation  Transfers Overall transfer level: Needs assistance Equipment used: Rolling walker (2 wheeled) Transfers: Sit to/from [facility] Sit to Stand: Mod  assist;+2 physical assistance;+2 safety/equipment Stand pivot transfers: Mod assist;+2 physical assistance;+2 safety/equipment       General transfer comment: Pt able to power up with mod A +2, fatigues very quickly, transferred to right today.  could not progress ambulation as per nursing, pt had episode earlier that she was sleepy possibly from too much meds.  Pt was able to follow PT but had a hard time maintaining arousal and was needing repetitionof commands.  Was able to transfer to chair but could not progress.   Ambulation/Gait                 Stairs            Wheelchair Mobility    Modified Rankin (Stroke Patients Only)       Balance Overall balance assessment: Needs assistance Sitting-balance support: Feet supported;No upper extremity supported Sitting balance-[name]: Good Sitting balance - Comments: sitting EOB with no back support   Standing balance support: Bilateral upper extremity supported Standing balance-[name]: Poor Standing balance comment: reliant on RW and external support from therapists                            Cognition Arousal/Alertness: Lethargic;Suspect due to medications(nurse said pt was possibly over medicated earlier in day) Behavior During Therapy: [facility] for tasks assessed/performed Overall Cognitive Status: Within Functional Limits for tasks assessed  Exercises General Exercises - Lower Extremity Ankle Circles/Pumps: AROM;Both;5 reps;Seated Long Arc Quad: AROM;Both;5 reps;Seated Heel Slides: AROM;Both;5 reps;Supine    General Comments General comments (skin integrity, edema, etc.): VAC in place left thigh      Pertinent Vitals/Pain Pain Assessment: Faces Faces Pain Scale: Hurts whole lot Pain Location: L Hip and LE  Pain Descriptors / Indicators: Discomfort;Grimacing;Sore;Tender Pain Intervention(s): Limited activity within patient's  tolerance;Monitored during session;Premedicated before session;Repositioned  VSS  Home Living                      Prior Function            PT Goals (current goals can now be found in the care plan section) Acute Rehab PT Goals Patient Stated Goal: to get better Progress towards PT goals: Progressing toward goals    Frequency    Min 3X/week      PT Plan Current plan remains appropriate    Co-evaluation PT/OT/SLP Co-Evaluation/Treatment: Yes            AM-PAC PT "6 Clicks" Daily Activity  Outcome Measure  Difficulty turning over in bed (including adjusting bedclothes, sheets and blankets)?: Unable Difficulty moving from lying on back to sitting on the side of the bed? : Unable Difficulty sitting down on and standing up from a chair with arms (e.g., wheelchair, bedside commode, etc,.)?: Unable Help needed moving to and from a bed to chair (including a wheelchair)?: Total Help needed walking in hospital room?: Total Help needed climbing 3-5 steps with a railing? : Total 6 Click Score: 6    End of Session Equipment Utilized During Treatment: Gait belt;Oxygen Activity Tolerance: Patient limited by fatigue;Patient limited by pain Patient left: in chair;with call bell/phone within reach;with chair alarm set;with family/visitor present(son in law came in after PT finished) Nurse Communication: Mobility status PT Visit Diagnosis: Unsteadiness on feet (R26.81);Muscle weakness (generalized) (M62.81);Pain Pain - Right/Left: Left Pain - part of body: Leg     Time: 0051-1021 PT Time Calculation (min) (ACUTE ONLY): 19 min  Charges:  $Therapeutic Activity: 8-22 mins                    G Codes:       [place] [phone] (pager)    Denice Paradise 06/05/2017, 1:47 PM

## 2017-06-05 NOTE — Clinical Social Work Note (Signed)
Clinical Social Work Assessment  Patient Details  Name: Ashlee Miles MRN: [id number] Date of Birth: [birth year]  Date of referral:  06/05/17               Reason for consult:  Facility Placement, Discharge Planning                Permission sought to share information with:  Facility Sport and exercise psychologist, Family Supports Permission granted to share information::  Yes, Verbal Permission Granted  Name::     Belenda Cruise  Agency::     Relationship::  Daughter/HCPOA  Contact Information:  [phone]  Housing/Transportation Living arrangements for the past 2 months:  [place], [place] of Information:  Patient, Medical Team, Adult Children Patient Interpreter Needed:  None Criminal Activity/Legal Involvement Pertinent to Current Situation/Hospitalization:  No - Comment as needed Significant Relationships:  Adult Children Lives with:  Adult Children Do you feel safe going back to the place where you live?  Yes Need for family participation in patient care:  Yes (Comment)  Care giving concerns:  PT recommending SNF once medically stable for discharge.   Social Worker assessment / plan:  CSW met with patient. Son-in-law at bedside. CSW introduced role and explained that PT recommendations would be discussed. Patient stated her daughter/HCPOA told her she would come home rather than SNF once discharged. Patient's son-in-law stated that they were wanting SNF. CSW provided SNF list in case they change their mind. CSW called patient's daughter and she confirmed plan to return home once stable for discharge. She is currently getting services through [facility]. RNCM notified. No further concerns. CSW encouraged patient and her daughter to contact CSW as needed. CSW signing off as social work intervention is no longer needed.  Employment status:  Retired Nurse, adult PT Recommendations:  [place]  / Referral to community resources:  [place]  Patient/Family's Response to care:  Patient and her daughter prefer to return home once stable for discharge. Patient's family supportive and involved in patient's care. Patient and her daughter appreciated social work intervention.  Patient/Family's Understanding of and Emotional Response to Diagnosis, Current Treatment, and Prognosis:  Patient and her daughter have a good understanding of the reason for admission and PT recommendations. Patient's daughter voiced concerns with staff communication with her.  Emotional Assessment Appearance:  Appears stated age Attitude/Demeanor/Rapport:  Other(Pleasant) Affect (typically observed):  Appropriate, Calm, Pleasant Orientation:  Oriented to Self, Oriented to Place, Oriented to  Time, Oriented to Situation Alcohol / Substance use:  Never Used Psych involvement (Current and /or in the community):  No (Comment)  Discharge Needs  Concerns to be addressed:  Care Coordination Readmission within the last 30 days:  Yes Current discharge risk:  Dependent with Mobility Barriers to Discharge:  Continued Medical Work up   Candie Chroman, LCSW 06/05/2017, 1:48 PM

## 2017-06-05 NOTE — NC FL2 (Signed)
[place] LEVEL OF CARE SCREENING TOOL     IDENTIFICATION  Patient Name: Ashlee Miles Birthdate: 1940-11-04 Sex: female Admission Date (Current Location): 05/31/2017  [facility] and [place] Number:  Herbalist and Address:  [facility] [place]. [facility] [place] [address] [place], [place] [address]      Provider Number: [id number]  Attending Physician Name and Address:  Rosita Fire, MD  Relative Name and Phone Number:       Current Level of Care: Hospital Recommended Level of Care: [place] Prior Approval Number:    Date Approved/Denied:   PASRR Number: [id number]  Discharge Plan: SNF    Current Diagnoses: Patient Active Problem List   Diagnosis Date Noted  . 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 Depression 05/31/2017  . GERD (gastroesophageal reflux disease) 05/31/2017  . Left groin pain 05/31/2017  . Protein C deficiency ([place])   . Left leg swelling 05/09/2017  . 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 Falls 04/25/2017  . Hypotension 04/25/2017  . Epigastric pain 04/25/2017  . Constipation 04/25/2017  . ESRD (end stage renal disease) on dialysis ([place])   . ESRD on dialysis ([place]) 04/06/2017  . Anemia due to chronic kidney disease 04/06/2017  . Thrombocytopenia ([place]) 04/06/2017  . Cough in adult 04/06/2017  . DVT (deep venous thrombosis) ([place]) 04/05/2017  . Pressure injury of skin 03/14/2017  . Acute metabolic encephalopathy 38/46/6599  . Skin lesion of hand 03/12/2017  . TIA (transient ischemic attack) 03/01/2017  . Syncope 03/01/2017  . Diabetes mellitus with complication ([place]) 35/70/1779  . Essential hypertension 03/01/2017  . Chronic diastolic CHF (congestive heart failure) ([place]) 03/01/2017    Orientation RESPIRATION BLADDER Height & Weight     Self, Time, Situation, Place  Normal Continent, External catheter Weight: 224 lb 13.9 oz (102 kg) Height:  5' 0.98" (154.9 cm)   BEHAVIORAL SYMPTOMS/MOOD NEUROLOGICAL BOWEL NUTRITION STATUS  (None) (None) Continent Diet(Renal with fluid restriction: 1200 mL.)  AMBULATORY STATUS COMMUNICATION OF NEEDS Skin   Extensive Assist Verbally Skin abrasions, Other (Comment), Bruising, PU Stage and Appropriate Care, Wound Vac, Surgical wounds(Blister, Cracking, MASD. Wound vac changed MWF.) PU Stage 1 Dressing: (Medial sacrum) PU Stage 2 Dressing: (Posterior right buttocks: Foam.)                   Personal Care Assistance Level of Assistance  Bathing, Feeding, Dressing Bathing Assistance: Maximum assistance Feeding assistance: Limited assistance Dressing Assistance: Maximum assistance     Functional Limitations Info  Sight, Hearing, Speech Sight Info: Adequate Hearing Info: Adequate Speech Info: Adequate    SPECIAL CARE FACTORS FREQUENCY  PT (By licensed PT), Blood pressure, OT (By licensed OT)     PT Frequency: 5 x week OT Frequency: 5 x week            Contractures Contractures Info: Not present    Additional Factors Info  Code Status, Allergies, Psychotropic Code Status Info: DNR Allergies Info: NKDA Psychotropic Info: Depression: Paxil 10 mg PO daily, Hydroxyzine 25 mg PO every 8 hours prn.         Current Medications (06/05/2017):  This is the current hospital active medication list Current Facility-Administered Medications  Medication Dose Route Frequency Provider Last Rate Last Dose  . 0.9 %  sodium chloride infusion   Intravenous Once Ivor Costa, MD      . acetaminophen (TYLENOL) tablet 650 mg  650 mg Oral Q6H PRN Ivor Costa, MD  650 mg at 06/01/17 1444  . camphor-menthol (SARNA) lotion 1 application  1 application Topical H4V PRN Ivor Costa, MD      . carvedilol (COREG) tablet 6.25 mg  6.25 mg Oral BID WC Ivor Costa, MD   Stopped at 06/05/17 [phone]  . Darbepoetin Alfa (ARANESP) injection 100 mcg  100 mcg Intravenous Q Mon-HD Roney Jaffe, MD      . docusate sodium (COLACE) capsule 100  mg  100 mg Oral BID Ivor Costa, MD   100 mg at 06/05/17 0900  . doxercalciferol (HECTOROL) injection 4 mcg  4 mcg Intravenous Q M,W,F-HD Alric Seton, PA-C      . feeding supplement (NEPRO CARB STEADY) liquid 237 mL  237 mL Oral TID PRN Ivor Costa, MD      . ferric gluconate (NULECIT) 125 mg in sodium chloride 0.9 % 100 mL IVPB  125 mg Intravenous Q M,W,F-HD Alric Seton, PA-C      . [START ON 06/06/2017] gabapentin (NEURONTIN) capsule 300 mg  300 mg Oral QHS Rosita Fire, MD      . guaiFENesin [facility]) 12 hr tablet 600 mg  600 mg Oral BID PRN Ivor Costa, MD      . heparin ADULT infusion 100 units/mL (25000 units/232mL sodium chloride 0.45%)  1,000 Units/hr Intravenous Continuous Rolla Flatten, RPH 10 mL/hr at 06/05/17 0846 1,000 Units/hr at 06/05/17 0846  . hydrALAZINE (APRESOLINE) injection 5 mg  5 mg Intravenous Q2H PRN Ivor Costa, MD      . hydrOXYzine (ATARAX/VISTARIL) tablet 25 mg  25 mg Oral Q8H PRN Ivor Costa, MD      . insulin aspart (novoLOG) injection 0-5 Units  0-5 Units Subcutaneous QHS Ivor Costa, MD      . insulin aspart (novoLOG) injection 0-9 Units  0-9 Units Subcutaneous TID WC Ivor Costa, MD   2 Units at 06/05/17 1223  . insulin glargine (LANTUS) injection 3 Units  3 Units Subcutaneous QHS Ivor Costa, MD   3 Units at 06/04/17 2110  . linaclotide (LINZESS) capsule 145 mcg  145 mcg Oral Daily PRN Ivor Costa, MD      . methocarbamol (ROBAXIN) tablet 500 mg  500 mg Oral Q8H PRN Ivor Costa, MD      . midodrine (PROAMATINE) tablet 10 mg  10 mg Oral Q M,W,F-HD Ivor Costa, MD      . multivitamin (RENA-VIT) tablet 1 tablet  1 tablet Oral QHS Ivor Costa, MD   1 tablet at 06/04/17 2118  . ondansetron (ZOFRAN) tablet 4 mg  4 mg Oral Q6H PRN Ivor Costa, MD       Or  . ondansetron (ZOFRAN) injection 4 mg  4 mg Intravenous Q6H PRN Ivor Costa, MD      . oxyCODONE-acetaminophen (PERCOCET/ROXICET) 5-325 MG per tablet 1 tablet  1 tablet Oral Q6H PRN Ivor Costa, MD   1 tablet  at 06/04/17 1742  . pantoprazole (PROTONIX) EC tablet 40 mg  40 mg Oral Daily Ivor Costa, MD   40 mg at 06/05/17 0934  . PARoxetine (PAXIL) tablet 10 mg  10 mg Oral Daily Ivor Costa, MD   10 mg at 06/05/17 0935  . phenol (CHLORASEPTIC) mouth spray 1 spray  1 spray Mouth/Throat PRN Vertis Kelch, NP   1 spray at 06/03/17 2355  . senna-docusate (Senokot-S) tablet 1 tablet  1 tablet Oral QHS PRN Ivor Costa, MD      . simvastatin (ZOCOR) tablet 40 mg  40 mg Oral q1800 Blaine Hamper,  Soledad Gerlach, MD   40 mg at 06/04/17 1744  . sodium chloride flush (NS) 0.9 % injection 10-40 mL  10-40 mL Intracatheter PRN Ivor Costa, MD   10 mL at 06/01/17 1055  . sucralfate (CARAFATE) 1 GM/10ML suspension 1 g  1 g Oral TID WC & HS Ivor Costa, MD   1 g at 06/05/17 1225  . triamcinolone cream (KENALOG) 0.1 % 1 application  1 application Topical M38T PRN Ivor Costa, MD      . warfarin (COUMADIN) tablet 7.5 mg  7.5 mg Oral ONCE-1800 Rolla Flatten, [facility]      . Warfarin - Pharmacist Dosing Inpatient   Does not apply q1800 Rosita Fire, MD      . zolpidem [facility]) tablet 5 mg  5 mg Oral QHS PRN Ivor Costa, MD         Discharge Medications: Please see discharge summary for a list of discharge medications.  Relevant Imaging Results:  Relevant Lab Results:   Additional Information SS#: [id number]. HD MWF at [facility].  Candie Chroman, LCSW

## 2017-06-05 NOTE — Progress Notes (Addendum)
PROGRESS NOTE    Ashlee Miles  JQG:[id number] DOB: 09-12-1940 DOA: 05/31/2017 PCP: Darlina Rumpf, MD   Brief Narrative: 76 year old female with history of hypertension, diabetes, ESRD on hemodialysis, DVT, protein C deficiency on Coumadin, pituitary adenoma, chronic diastolic congestive heart failure, GERD, depression presented with left groin pain and left leg swelling found to have left groin hematoma.  Patient underwent hematoma evacuation.  On antibiotics empirically.  Receiving dialysis per nephrologist.  Assessment & Plan:  #Left groin pain, left leg swelling due to left thigh hematoma: - status post left thigh hematoma evacuation, application of wound VAC and placement of antibiotic coated beads. -Vascular consult appreciated.  Patient received vitamin K and FFP to reverse Coumadin prior to the vascular procedure.  After discussion with the vascular starting Coumadin.  Today the vascular team starting IV heparin for subtherapeutic INR.  Continue to monitor CBC.  Patient currently has a wound VAC, further plan and possibly discharge home or skilled nursing home with wound VAC defer to surgeon.  #History of DVT and protein C deficiency: -Coumadin and heparin per vascular surgery.  Monitor INR.  No sign of active bleed.  Monitor CBC.  #Anemia due to chronic kidney disease, acute blood loss from hematoma: received  blood products.  Continue to monitor.  Continue IV iron and ESA during dialysis per nephrology.  #History of hypertension: Continue Coreg.  Monitor blood pressure.  #ESRD on hemodialysis: Hemodialysis managed by renal team.  Appreciate consult.  #Chronic diastolic congestive heart failure: No shortness of breath or chest pain.  Volume management during dialysis.  #Diabetes with ESRD: Continue insulin and monitor blood sugar level  #GERD: Continue Protonix  #Anxiety depression: Mood is stable.  #Acute metabolic/toxic encephalopathy: Patient was confused and  encephalopathic this morning likely contributed by morphine in ESRD patient.  Also reduced the dose of gabapentin to 300 nightly starting tomorrow.  Patient required medication for the pain management of left hematoma site.  I discontinue IV morphine.  Continue oral oxycodone as needed.  Ordered a dose of Narcan because of encephalopathy.  Use oxygen for support.  Discussed with the patient's nurse.  She has no focal neurological deficit.  PT OT recommended a skilled nursing facility.  Social worker consulted.  DVT prophylaxis: Coumadin and IV heparin. Code Status:DNR Family Communication: No family at bedside Disposition Plan: Currently admitted.  PT OT evaluation.    Consultants:   Nephrology  Vascular surgery  Procedures: Vascular surgery Antimicrobials: Cefazolin discontinued on December 16  Subjective: Seen and examined at bedside.  Confused this morning.  However denied nausea vomiting chest pain shortness of breath.  Review of systems limited. Objective: Vitals:   06/05/17 0200 06/05/17 0400 06/05/17 0500 06/05/17 0801  BP: 126/69 119/60    Pulse:  65    Resp: (!) 32 15    Temp:  98.4 F (36.9 C)  98.4 F (36.9 C)  TempSrc:  Oral  Oral  SpO2:  99%    Weight:   102 kg (224 lb 13.9 oz)   Height:        Intake/Output Summary (Last 24 hours) at 06/05/2017 1036 Last data filed at 06/05/2017 0500 Gross per 24 hour  Intake 605 ml  Output 375 ml  Net 230 ml   Filed Weights   06/02/17 1605 06/04/17 0750 06/05/17 0500  Weight: 102.9 kg (226 lb 13.7 oz) 103 kg (227 lb 1.2 oz) 102 kg (224 lb 13.9 oz)    Examination:  General exam: Confused female lying in  bed, not in distress. Respiratory system: Clear bilateral, respiratory for normal  Cardiovascular system: Regular rate rhythm S1-S2 normal.  No pedal edema. Gastrointestinal system: Abdomen soft, nontender.  Bowel sounds positive.   Central nervous system: Alert and oriented. No focal neurological  deficits. Extremities: Left thigh wound VAC, no sign of bleeding. Skin: No rashes, lesions or ulcers Psychiatry: Confused this morning.   Data Reviewed: I have personally reviewed following labs and imaging studies  CBC: Recent Labs  Lab 05/31/17 1618 06/01/17 0415 06/01/17 2226 06/02/17 0825 06/02/17 1700 06/02/17 2223 06/04/17 0404 06/05/17 0400  WBC 9.5 7.6 9.9  --   --  12.1* 8.3 6.7  NEUTROABS 7.5  --   --   --   --   --   --   --   HGB 7.0* 5.0* 6.9* 5.5* 8.2* 12.1 10.3* 10.2*  HCT 22.1* 15.5* 20.9* 16.7* 24.0* 33.4* 31.5* 31.3*  MCV 79.5 77.9* 81.0  --   --  82.1 83.3 85.3  PLT 322 240 217  --   --  197 202 354   Basic Metabolic Panel: Recent Labs  Lab 05/31/17 1618 06/01/17 0415 06/01/17 2226 06/02/17 1700 06/04/17 1123 06/05/17 0400  NA 142 140 139 145 135 136  K 4.2 3.7 3.9 4.0 3.9 4.2  CL 107 107 109  --  101 99*  CO2 24 26 24   --  26 27  GLUCOSE 89 76 207* 80 140* 191*  BUN 47* 48* 47*  --  31* 37*  CREATININE 4.99* 4.67* 4.72*  --  3.82* 4.69*  CALCIUM 9.0 8.3* 8.1*  --  8.5* 8.9  PHOS  --   --   --   --  3.2  --    GFR: Estimated Creatinine Clearance: 11.2 mL/min (A) (by C-G formula based on SCr of 4.69 mg/dL (H)). Liver Function Tests: Recent Labs  Lab 05/31/17 1618 06/04/17 1123  AST 17  --   ALT 8*  --   ALKPHOS 77  --   BILITOT 0.8  --   PROT 6.6  --   ALBUMIN 2.9* 2.4*   No results for input(s): LIPASE, AMYLASE in the last 168 hours. No results for input(s): AMMONIA in the last 168 hours. Coagulation Profile: Recent Labs  Lab 05/31/17 2306 06/01/17 0415 06/01/17 2226 06/04/17 1123 06/05/17 0400  INR 3.89 3.86 1.36 1.30 1.19   Cardiac Enzymes: No results for input(s): CKTOTAL, CKMB, CKMBINDEX, TROPONINI in the last 168 hours. BNP (last 3 results) No results for input(s): PROBNP in the last 8760 hours. HbA1C: No results for input(s): HGBA1C in the last 72 hours. CBG: Recent Labs  Lab 06/04/17 0752 06/04/17 1136  06/04/17 1619 06/04/17 2122 06/05/17 0802  GLUCAP 95 108* 160* 133* 174*   Lipid Profile: No results for input(s): CHOL, HDL, LDLCALC, TRIG, CHOLHDL, LDLDIRECT in the last 72 hours. Thyroid Function Tests: No results for input(s): TSH, T4TOTAL, FREET4, T3FREE, THYROIDAB in the last 72 hours. Anemia Panel: No results for input(s): VITAMINB12, FOLATE, FERRITIN, TIBC, IRON, RETICCTPCT in the last 72 hours. Sepsis Labs: Recent Labs  Lab 05/31/17 0012 05/31/17 1631  PROCALCITON 0.16  --   LATICACIDVEN  --  1.74    Recent Results (from the past 240 hour(s))  Culture, blood (Routine X 2) w Reflex to ID Panel     Status: None (Preliminary result)   Collection Time: 05/31/17 10:23 PM  Result Value Ref Range Status   Specimen Description BLOOD RIGHT ARM  Final  Special Requests IN PEDIATRIC BOTTLE Blood Culture adequate volume  Final   Culture NO GROWTH 3 DAYS  Final   Report Status PENDING  Incomplete  Culture, blood (Routine X 2) w Reflex to ID Panel     Status: None (Preliminary result)   Collection Time: 05/31/17 10:45 PM  Result Value Ref Range Status   Specimen Description BLOOD RIGHT HAND  Final   Special Requests IN PEDIATRIC BOTTLE Blood Culture adequate volume  Final   Culture NO GROWTH 3 DAYS  Final   Report Status PENDING  Incomplete  MRSA PCR Screening     Status: None   Collection Time: 06/01/17  7:14 PM  Result Value Ref Range Status   MRSA by PCR NEGATIVE NEGATIVE Final    Comment:        The GeneXpert MRSA Assay (FDA approved for NASAL specimens only), is one component of a comprehensive MRSA colonization surveillance program. It is not intended to diagnose MRSA infection nor to guide or monitor treatment for MRSA infections.          Radiology Studies: No results found.      Scheduled Meds: . carvedilol  6.25 mg Oral BID WC  . darbepoetin (ARANESP) injection - DIALYSIS  100 mcg Intravenous Q Mon-HD  . docusate sodium  100 mg Oral BID  .  doxercalciferol  4 mcg Intravenous Q M,W,F-HD  . gabapentin  300 mg Oral TID  . insulin aspart  0-5 Units Subcutaneous QHS  . insulin aspart  0-9 Units Subcutaneous TID WC  . insulin glargine  3 Units Subcutaneous QHS  . midodrine  10 mg Oral Q M,W,F-HD  . multivitamin  1 tablet Oral QHS  . pantoprazole  40 mg Oral Daily  . PARoxetine  10 mg Oral Daily  . simvastatin  40 mg Oral q1800  . sucralfate  1 g Oral TID WC & HS  . warfarin  7.5 mg Oral ONCE-1800  . Warfarin - Pharmacist Dosing Inpatient   Does not apply q1800   Continuous Infusions: . sodium chloride    . ferric gluconate (FERRLECIT/NULECIT) IV    . heparin 1,000 Units/hr (06/05/17 0846)     LOS: 5 days    Brittley Regner Tanna Furry, MD [facility] Pager [phone]  If 7PM-7AM, please contact night-coverage [email or web address] Password [email or web address] 06/05/2017, 10:36 AM

## 2017-06-05 NOTE — Progress Notes (Signed)
  Progress Note    06/05/2017 2:39 PM 3 Days Post-Op  Subjective: Persistent pain to incision of left thigh   Vitals:   06/05/17 0801 06/05/17 1126  BP:    Pulse:    Resp:    Temp: 98.4 F (36.9 C) 98.6 F (37 C)  SpO2:     Physical Exam: Cardiac: Tachycardic Lungs:  Inc respiratory rate secondary to pain Incisions: Skin edges viable; no purulence noted; antibiotic impregnated beads noted in wound bed; no active bleeding; healthy-appearing wound bed Extremities: Left foot is warm to touch with good capillary refill; unable to palpate thrill in left thigh graft secondary to pain with light palpation however is able to auscultate bruit Abdomen: Soft Neurologic: Moving all extremities well  CBC    Component Value Date/Time   WBC 6.7 06/05/2017 0400   RBC 3.67 (L) 06/05/2017 0400   HGB 10.2 (L) 06/05/2017 0400   HCT 31.3 (L) 06/05/2017 0400   PLT 206 06/05/2017 0400   MCV 85.3 06/05/2017 0400   MCH 27.8 06/05/2017 0400   MCHC 32.6 06/05/2017 0400   RDW 16.1 (H) 06/05/2017 0400   LYMPHSABS 1.6 05/31/2017 1618   MONOABS 0.3 05/31/2017 1618   EOSABS 0.2 05/31/2017 1618   BASOSABS 0.0 05/31/2017 1618    BMET    Component Value Date/Time   NA 136 06/05/2017 0400   K 4.2 06/05/2017 0400   CL 99 (L) 06/05/2017 0400   CO2 27 06/05/2017 0400   GLUCOSE 191 (H) 06/05/2017 0400   BUN 37 (H) 06/05/2017 0400   CREATININE 4.69 (H) 06/05/2017 0400   CALCIUM 8.9 06/05/2017 0400   CALCIUM 7.8 (L) 03/13/2017 1559   GFRNONAA 8 (L) 06/05/2017 0400   GFRAA 10 (L) 06/05/2017 0400    INR    Component Value Date/Time   INR 1.19 06/05/2017 0400     Intake/Output Summary (Last 24 hours) at 06/05/2017 1439 Last data filed at 06/05/2017 1341 Gross per 24 hour  Intake 705 ml  Output 300 ml  Net 405 ml     Assessment/Plan:  76 y.o. female is s/p evacuation of left thigh hematoma 3 Days Post-Op   - Wound VAC changed with wound nurse this afternoon; next change will be on  Wednesday 06/07/17 - Okay to continue hemodialysis with cannulization of the lateral aspect of her left thigh graft -Pharmacy titrating Coumadin and bridging with IV heparin -Vascular surgery will continue to follow surgical wound of left thigh    Dagoberto Ligas, PA-C [facility] [phone] 06/05/2017 2:39 PM

## 2017-06-05 NOTE — Progress Notes (Signed)
Patient continues to remain confused at times after Narcan given this am.  She is able to follow simple commands and step up at the bedside.  Tolerated sitting in the chair for 2 hrs before dressing change.

## 2017-06-05 NOTE — Progress Notes (Signed)
ANTICOAGULATION CONSULT NOTE - Follow-Up  Pharmacy Consult for Heparin + Warfarin Indication: hx DVT, protein C deficiency  No Known Allergies  Patient Measurements: Height: 5' 0.98" (154.9 cm) Weight: 224 lb 13.9 oz (102 kg) IBW/kg (Calculated) : 47.76  Heparin wt: 73 kg   Vital Signs: Temp: 98.4 F (36.9 C) (12/17 0400) Temp Source: Oral (12/17 0400) BP: 119/60 (12/17 0400) Pulse Rate: 65 (12/17 0400)  Labs: Recent Labs    06/02/17 2223 06/04/17 0404 06/04/17 1123 06/05/17 0400  HGB 12.1 10.3*  --  10.2*  HCT 33.4* 31.5*  --  31.3*  PLT 197 202  --  206  LABPROT  --   --  16.0* 15.0  INR  --   --  1.30 1.19  CREATININE  --   --  3.82* 4.69*    Estimated Creatinine Clearance: 11.2 mL/min (A) (by C-G formula based on SCr of 4.69 mg/dL (H)).   Medical History: Past Medical History:  Diagnosis Date  . CHF (congestive heart failure) ([place])   . Diabetes mellitus without complication ([place])   . ESRD (end stage renal disease) on dialysis ([place])   . History of pituitary adenoma   . Hypertension   . Protein C deficiency ([facility])     Infusions:  . sodium chloride    . ferric gluconate (FERRLECIT/NULECIT) IV      Assessment: 76 y/o female on warfarin PTA for protein C deficiency, remote hx DVT on dialysis presented with L groin infection and hematoma of left thigh dialysis graft. Warfarin was initially held with INR of 4, s/p IV vitamin K, FFP and tranfusions INR was 1.36 on 12/13. Patient went to OR 12/14 and hematoma was successfully evacuated, VVS inspected wound and found no active bleeding. Hgb nadir 5.5, s/p transfusions and wound evac trended up to 12.1.PTA warfarin dose 5 mg daily. Pharmacy consulted to resume warfarin dosing on 12/16 with plans to now start a heparin bridge with no bolus given recent hematoma requiring evacuation.   INR today remains SUBtherapeutic (INR 1.19 << 1.3, goal of 2-3). Hgb/Hct stable - no active bleeding noted. Hep Wt: 73 kg. Will monitor  bleeding closely with start of Heparin drip.  Goal of Therapy:  INR 2-3 Monitor platelets by anticoagulation protocol: Yes   Plan:  - Start Heparin at 1000 units/hr (10 ml/hr) - Warfarin 7.5 mg x 1 dose at 1800 today - Will continue to monitor for any signs/symptoms of bleeding and will follow up with heparin level in 8 hours and PT/INR in the AM  Thank you for allowing pharmacy to be a part of this patient's care.  Alycia Rossetti, PharmD, BCPS Clinical Pharmacist Pager: [phone] Clinical phone for 06/05/2017 from 7a-3:30p: [phone] If after 3:30p, please call main pharmacy at: [phone] 06/05/2017 7:59 AM

## 2017-06-05 NOTE — Consult Note (Signed)
[place] Nurse wound consult note Reason for Consult: home VAC However unclear about this consult, will ask CM about home VAC unit. Noted that dressing is due to be changed today, however [place] nurse not consulted for that.  Will do dressing change today to evaluate if bedside nursing ok to change moving forward Wound type: surgical  Measurement: 9cm x 7cm x 3cm with undermining distally that is 3.5cm  Wound SXJ:DBZMC, some darkness from electrocautery, antibiotic beads in place Drainage (amount, consistency, odor) sanguinous in canister, bedside nurse changed canister during dressing change today Periwound: induration and erythema distally, marked today PA at the bedside, image taken. Assessment of wound bed and periwound with WOC nurse Dressing procedure/placement/frequency: 1pc of black foam used to filled undermined section distally and fill remain the wound bed.  Sealed at 162mmHG, patient tolerated well.   OK for bedside nurse to change moving forward, uncomplicated NPWT VAC dressing.  Discussed POC with patient and bedside nurse.  Re consult if needed, will not follow at this time. Thanks  Cortana Vanderford [employer], RN,CWOCN, CNS, [place] [phone])

## 2017-06-06 ENCOUNTER — Inpatient Hospital Stay (HOSPITAL_COMMUNITY): Payer: 59

## 2017-06-06 DIAGNOSIS — S7012XA Contusion of left thigh, initial encounter: Secondary | ICD-10-CM

## 2017-06-06 DIAGNOSIS — G459 Transient cerebral ischemic attack, unspecified: Secondary | ICD-10-CM

## 2017-06-06 DIAGNOSIS — R4182 Altered mental status, unspecified: Secondary | ICD-10-CM

## 2017-06-06 LAB — GLUCOSE, CAPILLARY
Glucose-Capillary: 176 mg/dL — ABNORMAL HIGH (ref 65–99)
Glucose-Capillary: 140 mg/dL — ABNORMAL HIGH (ref 65–99)
Glucose-Capillary: 94 mg/dL (ref 65–99)
Glucose-Capillary: 220 mg/dL — ABNORMAL HIGH (ref 65–99)

## 2017-06-06 LAB — COMPREHENSIVE METABOLIC PANEL
ALT: <5 U/L — ABNORMAL LOW (ref 14–54)
AST: 18 U/L (ref 15–41)
Albumin: 2.5 g/dL — ABNORMAL LOW (ref 3.5–5.0)
Alkaline Phosphatase: 69 U/L (ref 38–126)
Anion gap: 8 (ref 5–15)
BUN: 30 mg/dL — ABNORMAL HIGH (ref 6–20)
CO2: 27 mmol/L (ref 22–32)
Calcium: 8.4 mg/dL — ABNORMAL LOW (ref 8.9–10.3)
Chloride: 99 mmol/L — ABNORMAL LOW (ref 101–111)
Creatinine, Ser: 4.21 mg/dL — ABNORMAL HIGH (ref 0.44–1.00)
GFR calc Af Amer: 11 mL/min — ABNORMAL LOW (ref >60)
GFR calc non Af Amer: 9 mL/min — ABNORMAL LOW (ref >60)
Glucose, Bld: 203 mg/dL — ABNORMAL HIGH (ref 65–99)
Potassium: 3.7 mmol/L (ref 3.5–5.1)
Sodium: 134 mmol/L — ABNORMAL LOW (ref 135–145)
Total Bilirubin: 0.4 mg/dL (ref 0.3–1.2)
Total Protein: 6.3 g/dL — ABNORMAL LOW (ref 6.5–8.1)

## 2017-06-06 LAB — PHOSPHORUS: Phosphorus: 2.6 mg/dL (ref 2.5–4.6)

## 2017-06-06 LAB — HEPARIN LEVEL (UNFRACTIONATED): Heparin Unfractionated: 0.32 IU/mL (ref 0.30–0.70)

## 2017-06-06 LAB — URINALYSIS, ROUTINE W REFLEX MICROSCOPIC
Bilirubin Urine: NEGATIVE
Glucose, UA: NEGATIVE mg/dL
Ketones, ur: NEGATIVE mg/dL
Leukocytes, UA: NEGATIVE
Nitrite: NEGATIVE
Protein, ur: 100 mg/dL — AB
Specific Gravity, Urine: 1.018 (ref 1.005–1.030)
pH: 5 (ref 5.0–8.0)

## 2017-06-06 LAB — CULTURE, BLOOD (ROUTINE X 2)
Culture: NO GROWTH
Culture: NO GROWTH
Special Requests: ADEQUATE
Special Requests: ADEQUATE

## 2017-06-06 LAB — CBC WITH DIFFERENTIAL/PLATELET
Basophils Absolute: 0 10*3/uL (ref 0.0–0.1)
Basophils Relative: 0 %
Eosinophils Absolute: 0.3 10*3/uL (ref 0.0–0.7)
Eosinophils Relative: 4 %
HCT: 31.3 % — ABNORMAL LOW (ref 36.0–46.0)
Hemoglobin: 10 g/dL — ABNORMAL LOW (ref 12.0–15.0)
Lymphocytes Relative: 25 %
Lymphs Abs: 1.6 10*3/uL (ref 0.7–4.0)
MCH: 27.7 pg (ref 26.0–34.0)
MCHC: 31.9 g/dL (ref 30.0–36.0)
MCV: 86.7 fL (ref 78.0–100.0)
Monocytes Absolute: 0.8 10*3/uL (ref 0.1–1.0)
Monocytes Relative: 12 %
Neutro Abs: 3.6 10*3/uL (ref 1.7–7.7)
Neutrophils Relative %: 59 %
Platelets: 207 10*3/uL (ref 150–400)
RBC: 3.61 MIL/uL — ABNORMAL LOW (ref 3.87–5.11)
RDW: 16 % — ABNORMAL HIGH (ref 11.5–15.5)
WBC: 6.2 10*3/uL (ref 4.0–10.5)

## 2017-06-06 LAB — LACTIC ACID, PLASMA: Lactic Acid, Venous: 0.7 mmol/L (ref 0.5–1.9)

## 2017-06-06 LAB — MAGNESIUM: Magnesium: 1.6 mg/dL — ABNORMAL LOW (ref 1.7–2.4)

## 2017-06-06 LAB — AMMONIA: Ammonia: 19 umol/L (ref 9–35)

## 2017-06-06 LAB — PROTIME-INR
INR: 1.2
Prothrombin Time: 15.1 seconds (ref 11.4–15.2)

## 2017-06-06 MED ORDER — DARBEPOETIN ALFA 100 MCG/0.5ML IJ SOSY
PREFILLED_SYRINGE | INTRAMUSCULAR | Status: AC
  Administered 2017-06-06: 100 ug via INTRAVENOUS
  Filled 2017-06-06: qty 0.5

## 2017-06-06 MED ORDER — MIDODRINE HCL 5 MG PO TABS
ORAL_TABLET | ORAL | Status: AC
  Administered 2017-06-06: 10 mg via ORAL
  Filled 2017-06-06: qty 2

## 2017-06-06 MED ORDER — SODIUM CHLORIDE 0.9 % IV SOLN
INTRAVENOUS | Status: DC
  Administered 2017-06-06: 09:00:00 via INTRAVENOUS

## 2017-06-06 MED ORDER — ORAL CARE MOUTH RINSE
15.0000 mL | Freq: Two times a day (BID) | OROMUCOSAL | Status: DC
  Administered 2017-06-06 – 2017-06-09 (×4): 15 mL via OROMUCOSAL

## 2017-06-06 MED ORDER — NALOXONE HCL 0.4 MG/ML IJ SOLN
INTRAMUSCULAR | Status: AC
  Administered 2017-06-06: 0.4 mg via INTRAVENOUS
  Administered 2017-06-06: 0.4 mg
  Filled 2017-06-06: qty 1

## 2017-06-06 MED ORDER — NALOXONE HCL 0.4 MG/ML IJ SOLN
INTRAMUSCULAR | Status: AC
  Filled 2017-06-06: qty 1

## 2017-06-06 MED ORDER — DOXERCALCIFEROL 4 MCG/2ML IV SOLN
INTRAVENOUS | Status: AC
  Administered 2017-06-06: 4 ug via INTRAVENOUS
  Filled 2017-06-06: qty 2

## 2017-06-06 MED ORDER — WARFARIN SODIUM 3 MG PO TABS
3.0000 mg | ORAL_TABLET | Freq: Once | ORAL | Status: AC
  Administered 2017-06-06: 3 mg via ORAL
  Filled 2017-06-06: qty 1

## 2017-06-06 MED ORDER — MAGNESIUM SULFATE 2 GM/50ML IV SOLN
2.0000 g | Freq: Once | INTRAVENOUS | Status: AC
  Administered 2017-06-06: 2 g via INTRAVENOUS
  Filled 2017-06-06: qty 50

## 2017-06-06 NOTE — Progress Notes (Signed)
Acute change in mental status at 0640 AEB pt difficult to arouse, following minimal commands, slurred speech. Pt acutely different from baseline.  Paged on call NP Baltazar Najjar at [phone] about change in mental status. I was advised to call rapid response and "probably call a code stroke."   I paged rapid response at [phone] and informed them of the patients change in mental status and what I was told by NP Baltazar Najjar.   Rapid response nurse was at the bedside at approximately 0710 to assess the patient.   The pt was given two doses of 0.4 mg narcan, both of which had minimal to no effect on the pt.  Rapid response called a code stroke and the pt was prepared for a STAT head CT.  I notified MD Choi at approximately 0720 about pts change in mental status and the pt going to CT.   I gave report to the oncoming nurse at approximately 0730 and informed her of what was going on with the patient, both acutely and with a full history and SBAR report.

## 2017-06-06 NOTE — Progress Notes (Signed)
HD tx ended 30 min early per MD order d/t emergent case, UF goal not met, blood rinsed back, VSS, report called to Martinique Taylor, RN

## 2017-06-06 NOTE — Progress Notes (Signed)
ANTICOAGULATION CONSULT NOTE - Follow-Up  Pharmacy Consult for Heparin + Warfarin Indication: hx DVT, protein C deficiency  No Known Allergies  Patient Measurements: Height: 5' 0.98" (154.9 cm) Weight: 225 lb 5 oz (102.2 kg) IBW/kg (Calculated) : 47.76  Heparin wt: 73 kg   Vital Signs: Temp: 99.2 F (37.3 C) (12/18 1000) Temp Source: Axillary (12/18 1000) BP: 106/35 (12/18 1000) Pulse Rate: 76 (12/18 1000)  Labs: Recent Labs    06/04/17 0404 06/04/17 1123 06/05/17 0400 06/05/17 1910 06/06/17 0432 06/06/17 0743  HGB 10.3*  --  10.2*  --   --  10.0*  HCT 31.5*  --  31.3*  --   --  31.3*  PLT 202  --  206  --   --  207  LABPROT  --  16.0* 15.0  --  15.1  --   INR  --  1.30 1.19  --  1.20  --   HEPARINUNFRC  --   --   --  0.33 0.32  --   CREATININE  --  3.82* 4.69*  --   --  4.21*    Estimated Creatinine Clearance: 12.5 mL/min (A) (by C-G formula based on SCr of 4.21 mg/dL (H)).   Medical History: Past Medical History:  Diagnosis Date  . CHF (congestive heart failure) ([place])   . Diabetes mellitus without complication ([place])   . ESRD (end stage renal disease) on dialysis ([place])   . History of pituitary adenoma   . Hypertension   . Protein C deficiency ([facility])     Infusions:  . sodium chloride    . sodium chloride    . sodium chloride    . sodium chloride 100 mL/hr at 06/06/17 0856  . heparin 1,000 Units/hr (06/06/17 1049)  . 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 Warfarin was initially held with INR of 4, s/p IV vitamin K, FFP and tranfusions INR was 1.36 on 12/13. Patient went to OR 12/14 and hematoma was successfully evacuated, VVS inspected wound and found no active bleeding. Hgb nadir 5.5, s/p transfusions and wound evac trended up to 12.1.PTA warfarin dose 5 mg daily. Pharmacy consulted to  resume warfarin dosing on 12/16, Heparin bridge started on 12/17 with no bolus given recent hematoma requiring evacuation.   The patient's heparin level this morning remains therapeutic (HL 0.32 << 0.33, goal of 0.3-0.5). INR today remains SUBtherapeutic and unchanged since RN did not give 12/17 evening dose until early this AM at 0400 (INR 1.2 << 1.19, goal of 2-3). The INR does not reflect the dose given this morning. Hgb/Hct stable - no active bleeding noted. Hep Wt: 73 kg. Will monitor bleeding closely.Will lower this evening's warfarin dose until the effects on the INR can be seen.   Noted code stroke called this morning. Head CT negative for CVA or acute bleed. Will continue to monitor closely, heparin goal already at lower range.   Goal of Therapy:  INR 2-3 Heparin level 0.3-0.5 units/ml Monitor platelets by anticoagulation protocol: Yes   Plan:  - Continue Heparin at 1000 units/hr (10 ml/hr) - Warfarin 3 mg x 1 dose at 1800 today - Will continue to monitor for any signs/symptoms of bleeding and will follow up with heparin level and PT/INR in the AM  Thank you for allowing pharmacy  to be a part of this patient's care.  Alycia Rossetti, PharmD, BCPS Clinical Pharmacist Pager: [phone] Clinical phone for 06/06/2017 from 7a-3:30p: [phone] If after 3:30p, please call main pharmacy at: [phone] 06/06/2017 11:00 AM

## 2017-06-06 NOTE — Progress Notes (Signed)
[facility] ROUNDING NOTE   Subjective:   Interval History:  76 year old female with history of hypertension, diabetes, ESRD on hemodialysis, DVT, protein C deficiency on Coumadin, pituitary adenoma, chronic diastolic congestive heart failure, GERD, depression presented with left groin pain and left leg swelling found to have left groin hematoma. Patient underwent hematoma evacuation  :[facility] MWF 4h 3K/2.25 bath 98.5kg Hep 3000 L thigh AVG -hect 4  -Fe thru 12/14  -mircera 150 last dose 11/2  Hoping to leave today     Objective:  Vital signs in last 24 hours:  Temp:  [97.4 F (36.3 C)-101.5 F (38.6 C)] 99.2 F (37.3 C) (12/18 1000) Pulse Rate:  [61-89] 76 (12/18 1000) Resp:  [8-20] 12 (12/18 1000) BP: (100-170)/(22-142) 106/35 (12/18 1000) SpO2:  [76 %-100 %] 100 % (12/18 1000) Weight:  [225 lb 5 oz (102.2 kg)-230 lb 13.2 oz (104.7 kg)] 225 lb 5 oz (102.2 kg) (12/18 0313)  Weight change: 3 lb 12 oz (1.7 kg) Filed Weights   06/05/17 0500 06/05/17 2315 06/06/17 0313  Weight: 224 lb 13.9 oz (102 kg) 230 lb 13.2 oz (104.7 kg) 225 lb 5 oz (102.2 kg)    Intake/Output: I/O last 3 completed shifts: In: 409 [P.O.:440; I.V.:216] Out: 3116 [Urine:450; Drains:200; Other:2466]   Intake/Output this shift:  No intake/output data recorded.  Obese AAF, no distress No jvd Chest clear  RRR no mrg ABd soft obese ntnd Ext L thigh marked edema, very tender. No RLE edema.  NF, ox 3 L thigh AVG +bruit     Basic Metabolic Panel: Recent Labs  Lab 06/01/17 0415 06/01/17 2226 06/02/17 1700 06/04/17 1123 06/05/17 0400 06/06/17 0743  NA 140 139 145 135 136 134*  K 3.7 3.9 4.0 3.9 4.2 3.7  CL 107 109  --  101 99* 99*  CO2 26 24  --  26 27 27   GLUCOSE 76 207* 80 140* 191* 203*  BUN 48* 47*  --  31* 37* 30*  CREATININE 4.67* 4.72*  --  3.82* 4.69* 4.21*  CALCIUM 8.3* 8.1*  --  8.5* 8.9 8.4*  MG  --   --   --   --   --  1.6*  PHOS  --   --   --  3.2  --   2.6    Liver Function Tests: Recent Labs  Lab 05/31/17 1618 06/04/17 1123 06/06/17 0743  AST 17  --  18  ALT 8*  --  <5*  ALKPHOS 77  --  69  BILITOT 0.8  --  0.4  PROT 6.6  --  6.3*  ALBUMIN 2.9* 2.4* 2.5*   No results for input(s): LIPASE, AMYLASE in the last 168 hours. No results for input(s): AMMONIA in the last 168 hours.  CBC: Recent Labs  Lab 05/31/17 1618  06/01/17 2226  06/02/17 1700 06/02/17 2223 06/04/17 0404 06/05/17 0400 06/06/17 0743  WBC 9.5   < > 9.9  --   --  12.1* 8.3 6.7 6.2  NEUTROABS 7.5  --   --   --   --   --   --   --  3.6  HGB 7.0*   < > 6.9*   < > 8.2* 12.1 10.3* 10.2* 10.0*  HCT 22.1*   < > 20.9*   < > 24.0* 33.4* 31.5* 31.3* 31.3*  MCV 79.5   < > 81.0  --   --  82.1 83.3 85.3 86.7  PLT 322   < > 217  --   --  197 202 206 207   < > = values in this interval not displayed.    Cardiac Enzymes: No results for input(s): CKTOTAL, CKMB, CKMBINDEX, TROPONINI in the last 168 hours.  BNP: Invalid input(s): POCBNP  CBG: Recent Labs  Lab 06/05/17 0802 06/05/17 1126 06/05/17 1554 06/05/17 1955 06/06/17 0814  GLUCAP 174* 164* 93 149* 176*    Microbiology: Results for orders placed or performed during the hospital encounter of 05/31/17  Culture, blood (Routine X 2) w Reflex to ID Panel     Status: None (Preliminary result)   Collection Time: 05/31/17 10:23 PM  Result Value Ref Range Status   Specimen Description BLOOD RIGHT ARM  Final   Special Requests IN PEDIATRIC BOTTLE Blood Culture adequate volume  Final   Culture NO GROWTH 4 DAYS  Final   Report Status PENDING  Incomplete  Culture, blood (Routine X 2) w Reflex to ID Panel     Status: None (Preliminary result)   Collection Time: 05/31/17 10:45 PM  Result Value Ref Range Status   Specimen Description BLOOD RIGHT HAND  Final   Special Requests IN PEDIATRIC BOTTLE Blood Culture adequate volume  Final   Culture NO GROWTH 4 DAYS  Final   Report Status PENDING  Incomplete  MRSA PCR  Screening     Status: None   Collection Time: 06/01/17  7:14 PM  Result Value Ref Range Status   MRSA by PCR NEGATIVE NEGATIVE Final    Comment:        The GeneXpert MRSA Assay (FDA approved for NASAL specimens only), is one component of a comprehensive MRSA colonization surveillance program. It is not intended to diagnose MRSA infection nor to guide or monitor treatment for MRSA infections.     Coagulation Studies: Recent Labs    06/04/17 1123 06/05/17 0400 06/06/17 0432  LABPROT 16.0* 15.0 15.1  INR 1.30 1.19 1.20    Urinalysis: No results for input(s): COLORURINE, LABSPEC, PHURINE, GLUCOSEU, HGBUR, BILIRUBINUR, KETONESUR, PROTEINUR, UROBILINOGEN, NITRITE, LEUKOCYTESUR in the last 72 hours.  Invalid input(s): APPERANCEUR    Imaging: Ct Head Code Stroke Wo Contrast  Result Date: 06/06/2017 CLINICAL DATA:  Altered mental status after dialysis. The initial code stroke designation was subsequently canceled by the neurologist. EXAM: CT HEAD WITHOUT CONTRAST TECHNIQUE: Contiguous axial images were obtained from the base of the skull through the vertex without intravenous contrast. COMPARISON:  Brain MRI 04/27/2017 FINDINGS: Brain: There is no evidence of acute infarct, intracranial hemorrhage, mass, midline shift, or extra-axial fluid collection. Mild cerebral atrophy is within normal limits for age. There is minimal chronic small vessel ischemic disease for age. Vascular: Calcified atherosclerosis at the skullbase. No hyperdense vessel. Skull: No fracture. Partially visualized sclerosis in the left maxilla, likely dental related. Sinuses/Orbits: Mild left maxillary sinus mucosal thickening. Small left mastoid effusion. Left cataract extraction. Other: None. IMPRESSION: No evidence of acute intracranial abnormality. Electronically Signed   By: Logan Bores M.D.   On: 06/06/2017 07:57     Medications:   . sodium chloride    . sodium chloride    . sodium chloride    . heparin  1,000 Units/hr (06/06/17 1049)  . magnesium sulfate 1 - 4 g bolus IVPB     . carvedilol  6.25 mg Oral BID WC  . darbepoetin (ARANESP) injection - DIALYSIS  100 mcg Intravenous Q Mon-HD  . docusate sodium  100 mg Oral BID  . doxercalciferol  4 mcg Intravenous Q M,W,F-HD  . gabapentin  300  mg Oral QHS  . insulin aspart  0-5 Units Subcutaneous QHS  . insulin aspart  0-9 Units Subcutaneous TID WC  . insulin glargine  3 Units Subcutaneous QHS  . mouth rinse  15 mL Mouth Rinse BID  . midodrine  10 mg Oral Q M,W,F-HD  . multivitamin  1 tablet Oral QHS  . naloxone      . pantoprazole  40 mg Oral Daily  . PARoxetine  10 mg Oral Daily  . simvastatin  40 mg Oral q1800  . sucralfate  1 g Oral TID WC & HS  . warfarin  3 mg Oral ONCE-1800  . Warfarin - Pharmacist Dosing Inpatient   Does not apply q1800   sodium chloride, sodium chloride, acetaminophen, alteplase, camphor-menthol, feeding supplement (NEPRO CARB STEADY), guaiFENesin, heparin, hydrALAZINE, hydrOXYzine, lidocaine (PF), lidocaine-prilocaine, linaclotide, methocarbamol, ondansetron **OR** ondansetron (ZOFRAN) IV, oxyCODONE-acetaminophen, pentafluoroprop-tetrafluoroeth, phenol, senna-docusate, sodium chloride flush, triamcinolone cream  Assessment/ Plan:  1. L thigh graft bleed / hematoma - sp hematoma evac 12/14. OK to use AV graft laterallimb   2. ESRD - HD MWF. K on low side. No heparin for the next several days. 3. Prot C def - on coumadin at home 4. Anemia of CKD/ acute blood loss - better, Hb up 8- 10 range ,will give darbe 100 ug on Monday w HD 5. MBD of CKD - cont meds 6. Volume -isup 3-4 kg Chronic pain syndrome  Possibly for discharge today  Will put in orders for dialysis in case she stays    LOS: 6 Ashlee Miles W @TODAY @11 :26 AM

## 2017-06-06 NOTE — Progress Notes (Signed)
EEG complete - results pending 

## 2017-06-06 NOTE — Progress Notes (Signed)
HD tx initiated via 15Gx2 w/o problem, pull/push/flush equally w/o problem, VSS, will cont to monitor while on HD tx 

## 2017-06-06 NOTE — Significant Event (Addendum)
Rapid Response Event Note  Overview: Time Called: 0655 Arrival Time: 0657 Event Type: Neurologic  Initial Focused Assessment: Per Rn patient with acute mental status change this am after dialysis.  She was confused and had trouble speaking.  After additional questioning LKW was changed to prior to dialysis.  Patient returned from dialysis  About 4am confused but has worsened over the past couple hours. BP 170/142  HR 81  RR 20  O2 sat 100% on 3l [place] Patient speaking nonsense words and is drowsy.  Initially not moving either upper extremities.    Interventions: Code Stroke Called Narcan given x 2 Patient more alert post narcan, but still speaking nonsense words. Rectal temp  101.5  Stat head Ct done Dr Cheral Marker at bedside to assess patient while in CT  Patient improving starting to speak clearly and answer questions appropriately. Probable delirium per Dr Cheral Marker Code Stroke canceled  Dr Maylene Roes at bedside to assess patient upon return to her room.  Plan of Care (if not transferred): Neuro checks q2 Rn to call if assistance needed  Event Summary: Name of Physician Notified: Dr Maylene Roes at    Name of Consulting Physician Notified: Dr Cheral Marker at 0700  Outcome: Stayed in room and stabalized  Event End Time: 0830  Raliegh Ip

## 2017-06-06 NOTE — Progress Notes (Signed)
    Left thigh palpable graft, wound vac in place to suction Bilateral DP/PT doppler signals Moving LE wells   Assessment/Plan:  76 y.o. female is s/p evacuation of left thigh hematoma 4 Days Post-Op   Wound vac change tomorrow  HD last night Rapid response called this am CT pending for code stroke  Follows commands at bedside  Roxy Horseman PA-C

## 2017-06-06 NOTE — Progress Notes (Signed)
Late entry for 06/06/17 at 0700. RN paged me right at shift change to say that pt had mental status changes. Was more lethargic and having difficulty finding words. MOE x 4. PERRL.  NP advised RN to call rapid response nurse and call a code stroke. NP paged attending to let her know of what was occurring.  [facility], NP [facility] 06/06/17 at 2018

## 2017-06-06 NOTE — Consult Note (Addendum)
Referring Physician: Dr. Maylene Roes    Chief Complaint: AMS  HPI: Ashlee Miles is an 76 y.o. female dialysis patient who experienced sudden onset of AMS this morning upon returning from dialysis. LKN was 0400. She is anticoagulated with heparin. She received one dose of Narcan with some improvement in mentation. A second dose of Narcan was then given, without further effect. She has been able to speak in short sentences with the ERT nurse - the sentences are grammatical but generally are perseverative and not bearing much relationship to what is being asked. She has been agitated and exclaiming in pain intermittently.   PMHx includes CHF, DM, ESRD on HD, pituitary adenoma, HTN, IVC filter, dialysis fistula and protein C deficiency.   LSN: 0400 tPA Given: No: Anticoagulated.   Past Medical History:  Diagnosis Date  . CHF (congestive heart failure) ([place])   . Diabetes mellitus without complication ([place])   . ESRD (end stage renal disease) on dialysis ([place])   . History of pituitary adenoma   . Hypertension   . Protein C deficiency [facility])     Past Surgical History:  Procedure Laterality Date  . AV FISTULA PLACEMENT Left 03/16/2017   Procedure: INSERTION OF ARTERIOVENOUS (AV) GORE-TEX GRAFT THIGH-LEFT;  Surgeon: Angelia Mould, MD;  Location: [place];  Service: Vascular;  Laterality: Left;  . CHOLECYSTECTOMY    . DIALYSIS FISTULA CREATION     clotted off  . FALSE ANEURYSM REPAIR Left 05/10/2017   Procedure: REPAIR FALSE ANEURYSM left thigh AVGG using Gore 66mx5cm Viabahn Endoprosthesis;  Surgeon: BSerafina Mitchell MD;  [facility];  Service: Vascular;  Laterality: Left;  . INSERTION OF DIALYSIS CATHETER Right 03/16/2017   Procedure: INSERTION OF RIGHT FEMORAL TUNNELED DIALYSIS CATHETER;  Surgeon: DAngelia Mould MD;  [place]  Service: Vascular;  Laterality: Right;  . IVC FILTER INSERTION    . PORTACATH PLACEMENT    . THROMBECTOMY AND REVISION OF ARTERIOVENTOUS (AV)  GORETEX  GRAFT Left 06/02/2017   Procedure: EVACUATION OF LEFT THIGH HEMATOMA  AND APPLICATION OF WOUND VAC;  Surgeon: BSerafina Mitchell MD;  [facility];  Service: Vascular;  Laterality: Left;    Family History  Problem Relation Age of Onset  . Breast cancer Mother   . Diabetes Sister   . Diabetes Brother   . CAD Other   . Stroke Neg Hx    Social History:  reports that  has never smoked. she has never used smokeless tobacco. She reports that she does not drink alcohol or use drugs.  Allergies: No Known Allergies  Medications:  Scheduled: . carvedilol  6.25 mg Oral BID WC  . darbepoetin (ARANESP) injection - DIALYSIS  100 mcg Intravenous Q Mon-HD  . docusate sodium  100 mg Oral BID  . doxercalciferol  4 mcg Intravenous Q M,W,F-HD  . gabapentin  300 mg Oral QHS  . insulin aspart  0-5 Units Subcutaneous QHS  . insulin aspart  0-9 Units Subcutaneous TID WC  . insulin glargine  3 Units Subcutaneous QHS  . mouth rinse  15 mL Mouth Rinse BID  . midodrine  10 mg Oral Q M,W,F-HD  . multivitamin  1 tablet Oral QHS  . naloxone      . pantoprazole  40 mg Oral Daily  . PARoxetine  10 mg Oral Daily  . simvastatin  40 mg Oral q1800  . sucralfate  1 g Oral TID WC & HS  . Warfarin - Pharmacist Dosing Inpatient  Does not apply q1800   Continuous: . sodium chloride    . sodium chloride    . sodium chloride    . sodium chloride    . heparin 1,000 Units/hr (06/05/17 0846)   LHT:DSKAJG chloride, sodium chloride, acetaminophen, alteplase, camphor-menthol, feeding supplement (NEPRO CARB STEADY), guaiFENesin, heparin, hydrALAZINE, hydrOXYzine, lidocaine (PF), lidocaine-prilocaine, linaclotide, methocarbamol, ondansetron **OR** ondansetron (ZOFRAN) IV, oxyCODONE-acetaminophen, pentafluoroprop-tetrafluoroeth, phenol, senna-docusate, sodium chloride flush, triamcinolone cream  ROS: Unable to obtain due to AMS  Physical Examination: Blood pressure (!) 170/142, pulse 79, temperature (!) 101.5  F (38.6 C), temperature source Rectal, resp. rate 20, height 5' 0.98" (1.549 m), weight 102.2 kg (225 lb 5 oz), SpO2 96 %.  Gen: Morbidly obese HEENT: No neck stiffness Lungs: Non-tachypneic Ext: LLE dialysis catheter  Neurologic Examination: Mental Status: Agitated and confused. Does not fixate on visual stimuli or track. Exclaims with short grammatical sentences in response to sternal rub and plantar stimulation. Does not follow commands. Attempts to answer some of nursing staff questions but perseverates and answers do not relate to questions asked.  Cranial Nerves: II:  PERRL. Blinks to threat bilaterally.  III,IV, VI: Eyes conjugately at midline without nystagmus. Does not initiate saccades to left or right. No skew deviation noted. Ptosis not present. Cannot overcome positioning of eyes with oculocephalic maneuver (note, patient awake and agitated).  V,VII: Grimace to brow ridge pressure bilaterally is symmetric. VIII: Hearing intact to some commands IX,X: No hypophonia or hoarseness XI: Head at midline XII: Does not follow command for tongue extension Motor/Sensory: Thrashes and withdraws all 4 limbs semipurposefully to noxious stimuli without asymmetry.  Deep Tendon Reflexes:  Brisk low amplitude upper ext reflexes without asymmetry.  Deferred lower extremity reflexes due to lower extremity pain.  Plantars: Equivocal bilaterally Cerebellar: Does not follow commands for testing Gait: Deferred  Results for orders placed or performed during the hospital encounter of 05/31/17 (from the past 48 hour(s))  Glucose, capillary     Status: None   Collection Time: 06/04/17  7:52 AM  Result Value Ref Range   Glucose-Capillary 95 65 - 99 mg/dL  Renal function panel     Status: Abnormal   Collection Time: 06/04/17 11:23 AM  Result Value Ref Range   Sodium 135 135 - 145 mmol/L   Potassium 3.9 3.5 - 5.1 mmol/L   Chloride 101 101 - 111 mmol/L   CO2 26 22 - 32 mmol/L   Glucose, Bld 140  (H) 65 - 99 mg/dL   BUN 31 (H) 6 - 20 mg/dL   Creatinine, Ser 3.82 (H) 0.44 - 1.00 mg/dL   Calcium 8.5 (L) 8.9 - 10.3 mg/dL   Phosphorus 3.2 2.5 - 4.6 mg/dL   Albumin 2.4 (L) 3.5 - 5.0 g/dL   GFR calc non Af Amer 11 (L) >60 mL/min   GFR calc Af Amer 12 (L) >60 mL/min    Comment: (NOTE) The eGFR has been calculated using the CKD EPI equation. This calculation has not been validated in all clinical situations. eGFR's persistently <60 mL/min signify possible Chronic Kidney Disease.    Anion gap 8 5 - 15  Protime-INR     Status: Abnormal   Collection Time: 06/04/17 11:23 AM  Result Value Ref Range   Prothrombin Time 16.0 (H) 11.4 - 15.2 seconds   INR 1.30   Glucose, capillary     Status: Abnormal   Collection Time: 06/04/17 11:36 AM  Result Value Ref Range   Glucose-Capillary 108 (H) 65 - 99 mg/dL  Glucose, capillary     Status: Abnormal   Collection Time: 06/04/17  4:19 PM  Result Value Ref Range   Glucose-Capillary 160 (H) 65 - 99 mg/dL  Glucose, capillary     Status: Abnormal   Collection Time: 06/04/17  9:22 PM  Result Value Ref Range   Glucose-Capillary 133 (H) 65 - 99 mg/dL  Protime-INR     Status: None   Collection Time: 06/05/17  4:00 AM  Result Value Ref Range   Prothrombin Time 15.0 11.4 - 15.2 seconds   INR 1.19   CBC     Status: Abnormal   Collection Time: 06/05/17  4:00 AM  Result Value Ref Range   WBC 6.7 4.0 - 10.5 K/uL   RBC 3.67 (L) 3.87 - 5.11 MIL/uL   Hemoglobin 10.2 (L) 12.0 - 15.0 g/dL   HCT 31.3 (L) 36.0 - 46.0 %   MCV 85.3 78.0 - 100.0 fL   MCH 27.8 26.0 - 34.0 pg   MCHC 32.6 30.0 - 36.0 g/dL   RDW 16.1 (H) 11.5 - 15.5 %   Platelets 206 150 - 400 K/uL  Basic metabolic panel     Status: Abnormal   Collection Time: 06/05/17  4:00 AM  Result Value Ref Range   Sodium 136 135 - 145 mmol/L   Potassium 4.2 3.5 - 5.1 mmol/L   Chloride 99 (L) 101 - 111 mmol/L   CO2 27 22 - 32 mmol/L   Glucose, Bld 191 (H) 65 - 99 mg/dL   BUN 37 (H) 6 - 20 mg/dL    Creatinine, Ser 4.69 (H) 0.44 - 1.00 mg/dL   Calcium 8.9 8.9 - 10.3 mg/dL   GFR calc non Af Amer 8 (L) >60 mL/min   GFR calc Af Amer 10 (L) >60 mL/min    Comment: (NOTE) The eGFR has been calculated using the CKD EPI equation. This calculation has not been validated in all clinical situations. eGFR's persistently <60 mL/min signify possible Chronic Kidney Disease.    Anion gap 10 5 - 15  Glucose, capillary     Status: Abnormal   Collection Time: 06/05/17  8:02 AM  Result Value Ref Range   Glucose-Capillary 174 (H) 65 - 99 mg/dL  Glucose, capillary     Status: Abnormal   Collection Time: 06/05/17 11:26 AM  Result Value Ref Range   Glucose-Capillary 164 (H) 65 - 99 mg/dL  Glucose, capillary     Status: None   Collection Time: 06/05/17  3:54 PM  Result Value Ref Range   Glucose-Capillary 93 65 - 99 mg/dL  Heparin level (unfractionated)     Status: None   Collection Time: 06/05/17  7:10 PM  Result Value Ref Range   Heparin Unfractionated 0.33 0.30 - 0.70 IU/mL    Comment:        IF HEPARIN RESULTS ARE BELOW EXPECTED VALUES, AND PATIENT DOSAGE HAS BEEN CONFIRMED, SUGGEST FOLLOW UP TESTING OF ANTITHROMBIN III LEVELS.   Glucose, capillary     Status: Abnormal   Collection Time: 06/05/17  7:55 PM  Result Value Ref Range   Glucose-Capillary 149 (H) 65 - 99 mg/dL  Protime-INR     Status: None   Collection Time: 06/06/17  4:32 AM  Result Value Ref Range   Prothrombin Time 15.1 11.4 - 15.2 seconds   INR 1.20   Heparin level (unfractionated)     Status: None   Collection Time: 06/06/17  4:32 AM  Result Value Ref Range   Heparin Unfractionated  0.32 0.30 - 0.70 IU/mL    Comment:        IF HEPARIN RESULTS ARE BELOW EXPECTED VALUES, AND PATIENT DOSAGE HAS BEEN CONFIRMED, SUGGEST FOLLOW UP TESTING OF ANTITHROMBIN III LEVELS.    CT head: There is no evidence of acute infarct, intracranial hemorrhage, mass, midline shift, or extra-axial fluid collection. Mild cerebral atrophy is  within normal limits for age. There is minimal chronic small vessel ischemic disease for age. Vascular: Calcified atherosclerosis at the skullbase. No hyperdense Vessel. Skull: No fracture. Partially visualized sclerosis in the left maxilla, likely dental related. Sinuses/Orbits: Mild left maxillary sinus mucosal thickening. Small left mastoid effusion. Left cataract extraction.  Assessment: 76 y.o. female with acute onset of AMS following dialysis 1. DDx includes electrolyte imbalance with metabolic encephalopathy, dialysis disequilibrium syndrome, unwitnessed seizure with postictal state, hypertensive encephalopathy, and toxic/infectious encephalopathy. No neck stiffness to suggest a meningitis. Top of basilar syndrome also possible.  2. CT head negative for acute abnormality. No hyperdense basilar or other intracranial artery.  2. Stroke Risk Factors - CHF, DM, HTN and protein C deficiency.  4. Recent TSH in November was normal. AST/ALT normal.   Plan: 1. Patient became more lucid after CTA head and neck ordered, and refused placement of 20 gauge IV for CTA with coherent full length sentences. Despite several attempts by nursing, she has continued to refuse IV placement.  2. Rapidly improving, now able to answer questions about her family coherently. Less agitation. Still with some waxing and waning mental status. Will hold off on further stroke work up as she is rapidly improving with no lateralized weakness, facial droop or aphasia.  3. Encephalopathy work up to include evaluation for possible infectious, metabolic and toxic etiologies.  4. EEG 5. Continue anticoagulation 6. Thiamine level (has been ordered STAT) 7. Following thiamine level blood draw, start high dose thiamine 250 mg IV TID x 3 days then 100 mg po qd thereafter.  8. Ammonia level  40 minutes spent in the emergent neurological evaluation and management of this critically ill patient  @Electronically  signed: Dr. Kerney Elbe 06/06/2017, 7:46 AM

## 2017-06-06 NOTE — Procedures (Signed)
ELECTROENCEPHALOGRAM REPORT  Date of Study: 06/06/2017  Patient's Name: Ashlee Miles MRN: [id number] Date of Birth: 1940-07-09  Referring Provider: Dr. Kerney Elbe  Clinical History: This is a 76 year old woman with altered mental status.  Medications: gabapentin (NEURONTIN) capsule 300 mg  acetaminophen (TYLENOL) tablet 650 mg  alteplase (CATHFLO ACTIVASE) injection 2 mg  camphor-menthol (SARNA) lotion 1 application  carvedilol (COREG) tablet 6.25 mg  Darbepoetin Alfa (ARANESP) injection 100 mcg  docusate sodium (COLACE) capsule 100 mg  doxercalciferol (HECTOROL) injection 4 mcg  feeding supplement (NEPRO CARB STEADY) liquid 237 mL  guaiFENesin (MUCINEX) 12 hr tablet 600 mg  heparin ADULT infusion 100 units/mL (25000 units/261mL sodium chloride 0.45%)  heparin injection 1,000 Units  hydrALAZINE (APRESOLINE) injection 5 mg  hydrOXYzine (ATARAX/VISTARIL) tablet 25 mg  insulin aspart (novoLOG) injection 0-5 Units  insulin aspart (novoLOG) injection 0-9 Units  insulin glargine (LANTUS) injection 3 Units  lidocaine (PF) (XYLOCAINE) 1 % injection 5 mL  lidocaine-prilocaine (EMLA) cream 1 application  linaclotide (LINZESS) capsule 145 mcg  MEDLINE mouth rinse  methocarbamol (ROBAXIN) tablet 500 mg  midodrine (PROAMATINE) tablet 10 mg  multivitamin (RENA-VIT) tablet 1 tablet  naloxone (NARCAN) 0.4 MG/ML injection  oxyCODONE-acetaminophen (PERCOCET/ROXICET) 5-325 MG per tablet 1 tablet  pantoprazole (PROTONIX) EC tablet 40 mg  PARoxetine (PAXIL) tablet 10 mg  pentafluoroprop-tetrafluoroeth (GEBAUERS) aerosol 1 application  phenol (CHLORASEPTIC) mouth spray 1 spray  senna-docusate (Senokot-S) tablet 1 tablet  simvastatin (ZOCOR) tablet 40 mg  sodium chloride flush (NS) 0.9 % injection 10-40 mL  sucralfate (CARAFATE) 1 GM/10ML suspension 1 g  triamcinolone cream (KENALOG) 0.1 % 1 application  warfarin (COUMADIN) tablet 3 mg   Technical Summary: A multichannel digital EEG  recording measured by the international 10-20 system with electrodes applied with paste and impedances below 5000 ohms performed as portable with EKG monitoring in a predominantly drowsy and asleep patient.  Hyperventilation was not performed. Photic stimulation was not performed.  The digital EEG was referentially recorded, reformatted, and digitally filtered in a variety of bipolar and referential montages for optimal display.   Description: The patient is predominantly drowsy and asleep during the recording.  During brief period of wakefulness, there is a symmetric, medium voltage 6 Hz posterior dominant rhythm that poorly attenuates with eye opening and eye closure. This is admixed with a small amount of diffuse 4-5 Hz theta slowing of the waking background.  During drowsiness and sleep, there is an increase in theta and delta slowing of the background with occasional vertex waves seen.  Photic stimulation did not elicit any abnormalities.  There were no epileptiform discharges or electrographic seizures seen.    EKG lead was unremarkable.  Impression: This predominantly drowsy and asleep EEG is abnormal due to mild diffuse slowing of the waking background with slowing of the posterior dominant rhythm.  Clinical Correlation of the above findings indicates diffuse cerebral dysfunction that is non-specific in etiology and can be seen with hypoxic/ischemic injury, toxic/metabolic encephalopathies, neurodegenerative disorders, medication effect, or due to excessive drowsiness.  The absence of epileptiform discharges does not rule out a clinical diagnosis of epilepsy.  Clinical correlation is advised.   Ellouise Newer, M.D.

## 2017-06-06 NOTE — Plan of Care (Signed)
Pt still in a considerable amount of pain with inadequate management. Pt received a dose of narcan on day shift to combat respiratory effects of PRN pain medication, pt receiving PO PRN pain meds but still in pain.

## 2017-06-06 NOTE — Progress Notes (Addendum)
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  Patient underwent hematoma evacuation.   Subjective: I was called by RN this morning regarding sudden mental status change. Patient returned from HD around 4am, was alert and oriented x 4. Around 6:45am, patient was found to be very confused, difficult to arouse. She was given narcan with minimal improvement. Rapid response nurse responded and code stroke was called. CT head completed was negative for acute intracranial abnormality.   On my examination, patient is arousable, alert to self and hospital but not to year. She followed all commands and had no focal neurologic deficit. Complained of left leg pain.   Addendum: Patient re-evaluated this afternoon, doing much better. She has been sleeping most of the day, underwent EEG. She is easily arousable, verbal, answers questions appropriately.   Assessment & Plan:   Principal Problem:   Left groin pain Active Problems:   Essential hypertension   Chronic diastolic CHF (congestive heart failure) (HCC)   Pressure injury of skin   DVT (deep venous thrombosis) (HCC)   Anemia due to chronic kidney disease   ESRD (end stage renal disease) on dialysis (HCC)   Left leg swelling   Diabetes mellitus with end stage renal disease (HCC)   Depression   GERD (gastroesophageal reflux disease)   Protein C deficiency (HCC)   Left thigh hematoma -S/p left thigh hematoma evacuation, application of wound VAC and placement of antibiotic coated beads 12/14  -Vascular surgery following, planning for wound vac change tomorrow    Acute encephalopathy, ?delirium  -Required narcan 12/17 due to confusion.  -Episode of mental status change again morning of 12/18, code stroke called. CT head negative for acute intracranial abnormality.  -Neurology following, thiamine, ammonia and EEG pending   Fever  -101.5 F this morning, without sepsis. No leukocytosis or lactic acidosis. Check UA to rule out UTI. Left thigh wound vac and thigh does not appear to be source of infection   History of DVT and protein C deficiency -Coumadin and heparin per vascular surgery.  Monitor INR, CBC   Anemia due to chronic kidney disease, acute blood loss from hematoma -Stable   Hypertension -Continue Coreg  ESRD on hemodialysis -Nephrology following   Chronic diastolic congestive heart failure -Stable   Diabetes with ESRD -Continue lantus, novolog SSI   GERD -Continue Protonix  Anxiety depression -Continue paxil   Hypomagnesemia -Replace, trend     DVT prophylaxis: Coumadin, IV heparin Code Status: DNR Family Communication: No family at bedside, called and left message for daughter Disposition Plan: SNF pending    Consultants:   Nephrology  Vascular surgery  Neurology  Procedures:   Left thigh hematoma evacuation 12/14 with Dr. Trula Slade   Antimicrobials:  Anti-infectives (From admission, onward)   Start     Dose/Rate Route Frequency Ordered Stop   06/02/17 1728  vancomycin (VANCOCIN) powder  Status:  Discontinued       As needed 06/02/17 1729 06/02/17 1813   06/02/17 1615  ceFAZolin (ANCEF) IVPB 2g/100 mL premix  Status:  Discontinued     2 g 200 mL/hr over 30 Minutes Intravenous  Once 06/02/17 1608 06/02/17 2030  06/01/17 2200  ceFAZolin (ANCEF) IVPB 1 g/50 mL premix  Status:  Discontinued     1 g 100 mL/hr over 30 Minutes Intravenous Every 24 hours 05/31/17 2100 06/04/17 1204   06/01/17 1100  ceFAZolin (ANCEF) IVPB 1 g/50 mL premix  Status:  Discontinued    Comments:  Send with pt to OR   1  g 100 mL/hr over 30 Minutes Intravenous To [facility] 05/31/17 1714 06/01/17 1149   05/31/17 2130  ceFAZolin (ANCEF) IVPB 2g/100 mL premix     2 g 200 mL/hr over 30 Minutes Intravenous  Once 05/31/17 2055 05/31/17 2330      Objective: Vitals:   06/06/17 0816 06/06/17 1000 06/06/17 1125 06/06/17 1148  BP: (!) 158/67 (!) 106/35  (!) 108/44  Pulse: 86 76  61  Resp: 14 12  (!) 9  Temp:  99.2 F (37.3 C) 99.8 F (37.7 C)   TempSrc:  Axillary Rectal   SpO2: 100% 100%  100%  Weight:      Height:        Intake/Output Summary (Last 24 hours) at 06/06/2017 1241 Last data filed at 06/06/2017 0600 Gross per 24 hour  Intake 656 ml  Output 2966 ml  Net -2310 ml   Filed Weights   06/05/17 0500 06/05/17 2315 06/06/17 0313  Weight: 102 kg (224 lb 13.9 oz) 104.7 kg (230 lb 13.2 oz) 102.2 kg (225 lb 5 oz)    Examination:  General exam: Appears calm and comfortable  Respiratory system: Clear to auscultation. Respiratory effort normal. Cardiovascular system: S1 & S2 heard, RRR. No JVD, murmurs, rubs, gallops or clicks. No pedal edema. Gastrointestinal system: Abdomen is nondistended, soft and nontender. No organomegaly or masses felt. Normal bowel sounds heard. Central nervous system: Alert and oriented to self and place, no focal neuro deficits, CN 2-12 grossly intact, moves all extremities  Extremities: Symmetric,+left thigh wound vac  Psychiatry: Judgement and insight appear normal  Data Reviewed: I have personally reviewed following labs and imaging studies  CBC: Recent Labs  Lab 05/31/17 1618  06/01/17 2226  06/02/17 1700 06/02/17 2223 06/04/17 0404 06/05/17 0400 06/06/17 0743  WBC 9.5   < > 9.9  --   --  12.1* 8.3 6.7 6.2  NEUTROABS 7.5  --   --   --   --   --   --   --  3.6  HGB 7.0*   < > 6.9*   < > 8.2* 12.1 10.3* 10.2* 10.0*  HCT 22.1*   < > 20.9*   < > 24.0* 33.4* 31.5* 31.3* 31.3*  MCV 79.5   < > 81.0  --   --  82.1 83.3 85.3 86.7  PLT 322   < > 217  --    --  197 202 206 207   < > = values in this interval not displayed.   Basic Metabolic Panel: Recent Labs  Lab 06/01/17 0415 06/01/17 2226 06/02/17 1700 06/04/17 1123 06/05/17 0400 06/06/17 0743  NA 140 139 145 135 136 134*  K 3.7 3.9 4.0 3.9 4.2 3.7  CL 107 109  --  101 99* 99*  CO2 26 24  --  26 27 27   GLUCOSE 76 207* 80 140* 191* 203*  BUN 48* 47*  --  31* 37* 30*  CREATININE 4.67* 4.72*  --  3.82* 4.69* 4.21*  CALCIUM 8.3* 8.1*  --  8.5* 8.9 8.4*  MG  --   --   --   --   --  1.6*  PHOS  --   --   --  3.2  --  2.6   GFR: Estimated Creatinine Clearance: 12.5 mL/min (A) (by C-G formula based on SCr of 4.21 mg/dL (H)). Liver Function Tests: Recent Labs  Lab 05/31/17 1618 06/04/17 1123 06/06/17 0743  AST 17  --  18  ALT 8*  --  <5*  ALKPHOS 77  --  69  BILITOT 0.8  --  0.4  PROT 6.6  --  6.3*  ALBUMIN 2.9* 2.4* 2.5*   No results for input(s): LIPASE, AMYLASE in the last 168 hours. No results for input(s): AMMONIA in the last 168 hours. Coagulation Profile: Recent Labs  Lab 06/01/17 0415 06/01/17 2226 06/04/17 1123 06/05/17 0400 06/06/17 0432  INR 3.86 1.36 1.30 1.19 1.20   Cardiac Enzymes: No results for input(s): CKTOTAL, CKMB, CKMBINDEX, TROPONINI in the last 168 hours. BNP (last 3 results) No results for input(s): PROBNP in the last 8760 hours. HbA1C: No results for input(s): HGBA1C in the last 72 hours. CBG: Recent Labs  Lab 06/05/17 1126 06/05/17 1554 06/05/17 1955 06/06/17 0814 06/06/17 1146  GLUCAP 164* 93 149* 176* 140*   Lipid Profile: No results for input(s): CHOL, HDL, LDLCALC, TRIG, CHOLHDL, LDLDIRECT in the last 72 hours. Thyroid Function Tests: No results for input(s): TSH, T4TOTAL, FREET4, T3FREE, THYROIDAB in the last 72 hours. Anemia Panel: No results for input(s): VITAMINB12, FOLATE, FERRITIN, TIBC, IRON, RETICCTPCT in the last 72 hours. Sepsis Labs: Recent Labs  Lab 05/31/17 0012 05/31/17 1631 06/06/17 0811  PROCALCITON  0.16  --   --   LATICACIDVEN  --  1.74 0.7    Recent Results (from the past 240 hour(s))  Culture, blood (Routine X 2) w Reflex to ID Panel     Status: None (Preliminary result)   Collection Time: 05/31/17 10:23 PM  Result Value Ref Range Status   Specimen Description BLOOD RIGHT ARM  Final   Special Requests IN PEDIATRIC BOTTLE Blood Culture adequate volume  Final   Culture NO GROWTH 4 DAYS  Final   Report Status PENDING  Incomplete  Culture, blood (Routine X 2) w Reflex to ID Panel     Status: None (Preliminary result)   Collection Time: 05/31/17 10:45 PM  Result Value Ref Range Status   Specimen Description BLOOD RIGHT HAND  Final   Special Requests IN PEDIATRIC BOTTLE Blood Culture adequate volume  Final   Culture NO GROWTH 4 DAYS  Final   Report Status PENDING  Incomplete  MRSA PCR Screening     Status: None   Collection Time: 06/01/17  7:14 PM  Result Value Ref Range Status   MRSA by PCR NEGATIVE NEGATIVE Final    Comment:        The GeneXpert MRSA Assay (FDA approved for NASAL specimens only), is one component of a comprehensive MRSA colonization surveillance program. It is not intended to diagnose MRSA infection nor to guide or monitor treatment for MRSA infections.        Radiology Studies: Ct Head Code Stroke Wo Contrast  Result Date: 06/06/2017 CLINICAL DATA:  Altered mental status after dialysis. The initial code stroke designation was subsequently canceled by the neurologist. EXAM: CT HEAD WITHOUT CONTRAST TECHNIQUE: Contiguous axial images were obtained from the base of the skull through the vertex without intravenous contrast. COMPARISON:  Brain MRI 04/27/2017 FINDINGS: Brain: There is no evidence of acute infarct, intracranial hemorrhage, mass, midline shift, or extra-axial fluid collection. Mild cerebral atrophy is within  normal limits for age. There is minimal chronic small vessel ischemic disease for age. Vascular: Calcified atherosclerosis at the  skullbase. No hyperdense vessel. Skull: No fracture. Partially visualized sclerosis in the left maxilla, likely dental related. Sinuses/Orbits: Mild left maxillary sinus mucosal thickening. Small left mastoid effusion. Left cataract extraction. Other: None. IMPRESSION: No evidence of acute intracranial abnormality. Electronically Signed   By: Logan Bores M.D.   On: 06/06/2017 07:57      Scheduled Meds: . carvedilol  6.25 mg Oral BID WC  . darbepoetin (ARANESP) injection - DIALYSIS  100 mcg Intravenous Q Mon-HD  . docusate sodium  100 mg Oral BID  . doxercalciferol  4 mcg Intravenous Q M,W,F-HD  . gabapentin  300 mg Oral QHS  . insulin aspart  0-5 Units Subcutaneous QHS  . insulin aspart  0-9 Units Subcutaneous TID WC  . insulin glargine  3 Units Subcutaneous QHS  . mouth rinse  15 mL Mouth Rinse BID  . midodrine  10 mg Oral Q M,W,F-HD  . multivitamin  1 tablet Oral QHS  . naloxone      . pantoprazole  40 mg Oral Daily  . PARoxetine  10 mg Oral Daily  . simvastatin  40 mg Oral q1800  . sucralfate  1 g Oral TID WC & HS  . warfarin  3 mg Oral ONCE-1800  . Warfarin - Pharmacist Dosing Inpatient   Does not apply q1800   Continuous Infusions: . sodium chloride    . sodium chloride    . sodium chloride    . heparin 1,000 Units/hr (06/06/17 1049)  . magnesium sulfate 1 - 4 g bolus IVPB 2 g (06/06/17 1144)     LOS: 6 days    Time spent: 50 minutes   Dessa Phi, DO [facility] [email or web address] Password [email or web address] 06/06/2017, 12:41 PM

## 2017-06-07 ENCOUNTER — Inpatient Hospital Stay (HOSPITAL_COMMUNITY): Payer: 59

## 2017-06-07 DIAGNOSIS — R404 Transient alteration of awareness: Secondary | ICD-10-CM

## 2017-06-07 LAB — BASIC METABOLIC PANEL
Anion gap: 10 (ref 5–15)
BUN: 40 mg/dL — ABNORMAL HIGH (ref 6–20)
CO2: 26 mmol/L (ref 22–32)
Calcium: 8.8 mg/dL — ABNORMAL LOW (ref 8.9–10.3)
Chloride: 98 mmol/L — ABNORMAL LOW (ref 101–111)
Creatinine, Ser: 5.01 mg/dL — ABNORMAL HIGH (ref 0.44–1.00)
GFR calc Af Amer: 9 mL/min — ABNORMAL LOW (ref >60)
GFR calc non Af Amer: 8 mL/min — ABNORMAL LOW (ref >60)
Glucose, Bld: 182 mg/dL — ABNORMAL HIGH (ref 65–99)
Potassium: 3.8 mmol/L (ref 3.5–5.1)
Sodium: 134 mmol/L — ABNORMAL LOW (ref 135–145)

## 2017-06-07 LAB — CBC
HCT: 30.5 % — ABNORMAL LOW (ref 36.0–46.0)
Hemoglobin: 9.6 g/dL — ABNORMAL LOW (ref 12.0–15.0)
MCH: 27 pg (ref 26.0–34.0)
MCHC: 31.5 g/dL (ref 30.0–36.0)
MCV: 85.9 fL (ref 78.0–100.0)
Platelets: 227 10*3/uL (ref 150–400)
RBC: 3.55 MIL/uL — ABNORMAL LOW (ref 3.87–5.11)
RDW: 16 % — ABNORMAL HIGH (ref 11.5–15.5)
WBC: 5.7 10*3/uL (ref 4.0–10.5)

## 2017-06-07 LAB — GLUCOSE, CAPILLARY
Glucose-Capillary: 159 mg/dL — ABNORMAL HIGH (ref 65–99)
Glucose-Capillary: 138 mg/dL — ABNORMAL HIGH (ref 65–99)
Glucose-Capillary: 177 mg/dL — ABNORMAL HIGH (ref 65–99)

## 2017-06-07 LAB — HEPARIN LEVEL (UNFRACTIONATED)
Heparin Unfractionated: 0.23 IU/mL — ABNORMAL LOW (ref 0.30–0.70)
Heparin Unfractionated: 0.22 IU/mL — ABNORMAL LOW (ref 0.30–0.70)

## 2017-06-07 LAB — PROTIME-INR
INR: 1.29
Prothrombin Time: 16 seconds — ABNORMAL HIGH (ref 11.4–15.2)

## 2017-06-07 LAB — MAGNESIUM: Magnesium: 2.1 mg/dL (ref 1.7–2.4)

## 2017-06-07 MED ORDER — SODIUM CHLORIDE 0.9 % IV SOLN: 100.0000 mL | INTRAVENOUS | Status: DC | PRN

## 2017-06-07 MED ORDER — SENNOSIDES-DOCUSATE SODIUM 8.6-50 MG PO TABS
1.0000 | ORAL_TABLET | Freq: Every day | ORAL | Status: DC
  Administered 2017-06-07 – 2017-06-08 (×2): 1 via ORAL
  Filled 2017-06-07 (×2): qty 1

## 2017-06-07 MED ORDER — POLYETHYLENE GLYCOL 3350 17 G PO PACK
17.0000 g | PACK | Freq: Every day | ORAL | Status: DC
  Administered 2017-06-07 – 2017-06-09 (×3): 17 g via ORAL
  Filled 2017-06-07 (×3): qty 1

## 2017-06-07 MED ORDER — PENTAFLUOROPROP-TETRAFLUOROETH EX AERO: 1.0000 "application " | INHALATION_SPRAY | CUTANEOUS | Status: DC | PRN

## 2017-06-07 MED ORDER — THIAMINE HCL 100 MG/ML IJ SOLN
Freq: Three times a day (TID) | INTRAVENOUS | Status: DC
  Administered 2017-06-07 – 2017-06-09 (×8): via INTRAVENOUS
  Filled 2017-06-07 (×10): qty 50

## 2017-06-07 MED ORDER — ALTEPLASE 2 MG IJ SOLR: 2.0000 mg | Freq: Once | INTRAMUSCULAR | Status: DC | PRN

## 2017-06-07 MED ORDER — WARFARIN SODIUM 3 MG PO TABS
3.0000 mg | ORAL_TABLET | Freq: Once | ORAL | Status: AC
  Administered 2017-06-07: 3 mg via ORAL
  Filled 2017-06-07: qty 1

## 2017-06-07 MED ORDER — THIAMINE HCL 100 MG/ML IJ SOLN: 250.0000 mg | Freq: Three times a day (TID) | INTRAMUSCULAR | Status: DC

## 2017-06-07 MED ORDER — LIDOCAINE-PRILOCAINE 2.5-2.5 % EX CREA: 1.0000 "application " | TOPICAL_CREAM | CUTANEOUS | Status: DC | PRN

## 2017-06-07 MED ORDER — DOXERCALCIFEROL 4 MCG/2ML IV SOLN
INTRAVENOUS | Status: AC
  Administered 2017-06-07: 4 ug via INTRAVENOUS
  Filled 2017-06-07: qty 2

## 2017-06-07 MED ORDER — LIDOCAINE HCL (PF) 1 % IJ SOLN: 5.0000 mL | INTRAMUSCULAR | Status: DC | PRN

## 2017-06-07 MED ORDER — HEPARIN SODIUM (PORCINE) 1000 UNIT/ML DIALYSIS: 1000.0000 [IU] | INTRAMUSCULAR | Status: DC | PRN

## 2017-06-07 NOTE — Progress Notes (Addendum)
Subjective: Per nursing note from this AM: "The pt's pain seems to be doing much better, they are able to move their left leg a little bit more and can handle having their leg touch better than they could before."   Objective: Current vital signs: BP (!) 107/47   Pulse 63   Temp 99 F (37.2 C) (Axillary)   Resp 11   Ht 5' 0.98" (1.549 m)   Wt 104 kg (229 lb 4.5 oz)   SpO2 100%   BMI 43.34 kg/m  Vital signs in last 24 hours: Temp:  [97.9 F (36.6 C)-99.8 F (37.7 C)] 99 F (37.2 C) (12/19 0712) Pulse Rate:  [54-86] 63 (12/19 0600) Resp:  [8-17] 11 (12/19 0600) BP: (102-158)/(35-78) 107/47 (12/19 0600) SpO2:  [87 %-100 %] 100 % (12/19 0600) Weight:  [104 kg (229 lb 4.5 oz)] 104 kg (229 lb 4.5 oz) (12/19 0431)  Intake/Output from previous day: 12/18 0701 - 12/19 0700 In: 770 [P.O.:240; I.V.:530] Out: 250 [Urine:150; Drains:100] Intake/Output this shift: No intake/output data recorded. Nutritional status: Diet renal with fluid restriction Fluid restriction: 1200 mL Fluid; Room service appropriate? Yes; Fluid consistency: Thin  Neurologic Exam: Mental Status: calm and knows she is in the hospital. Able to follow commands.  Cranial Nerves: II:  PERRL. Visual fields intact.  III,IV, VI: Eyes conjugately at midline without nystagmus. V,VII: face symjmetric VIII: Hearing intact to some commands IX,X: No hypophonia or hoarseness  Motor/Sensory: Moving bilateral UE 4/5 and right LE 4/5 but left LE in pain Deep Tendon Reflexes:  Brisk low amplitude upper ext reflexes without asymmetry.  Deferred lower extremity reflexes due to lower extremity pain.      Lab Results: Results for orders placed or performed during the hospital encounter of 05/31/17 (from the past 48 hour(s))  Glucose, capillary     Status: Abnormal   Collection Time: 06/05/17  8:02 AM  Result Value Ref Range   Glucose-Capillary 174 (H) 65 - 99 mg/dL  Glucose, capillary     Status: Abnormal   Collection  Time: 06/05/17 11:26 AM  Result Value Ref Range   Glucose-Capillary 164 (H) 65 - 99 mg/dL  Glucose, capillary     Status: None   Collection Time: 06/05/17  3:54 PM  Result Value Ref Range   Glucose-Capillary 93 65 - 99 mg/dL  Heparin level (unfractionated)     Status: None   Collection Time: 06/05/17  7:10 PM  Result Value Ref Range   Heparin Unfractionated 0.33 0.30 - 0.70 IU/mL    Comment:        IF HEPARIN RESULTS ARE BELOW EXPECTED VALUES, AND PATIENT DOSAGE HAS BEEN CONFIRMED, SUGGEST FOLLOW UP TESTING OF ANTITHROMBIN III LEVELS.   Glucose, capillary     Status: Abnormal   Collection Time: 06/05/17  7:55 PM  Result Value Ref Range   Glucose-Capillary 149 (H) 65 - 99 mg/dL  Protime-INR     Status: None   Collection Time: 06/06/17  4:32 AM  Result Value Ref Range   Prothrombin Time 15.1 11.4 - 15.2 seconds   INR 1.20   Heparin level (unfractionated)     Status: None   Collection Time: 06/06/17  4:32 AM  Result Value Ref Range   Heparin Unfractionated 0.32 0.30 - 0.70 IU/mL    Comment:        IF HEPARIN RESULTS ARE BELOW EXPECTED VALUES, AND PATIENT DOSAGE HAS BEEN CONFIRMED, SUGGEST FOLLOW UP TESTING OF ANTITHROMBIN III LEVELS.   CBC with  Differential/Platelet     Status: Abnormal   Collection Time: 06/06/17  7:43 AM  Result Value Ref Range   WBC 6.2 4.0 - 10.5 K/uL   RBC 3.61 (L) 3.87 - 5.11 MIL/uL   Hemoglobin 10.0 (L) 12.0 - 15.0 g/dL   HCT 31.3 (L) 36.0 - 46.0 %   MCV 86.7 78.0 - 100.0 fL   MCH 27.7 26.0 - 34.0 pg   MCHC 31.9 30.0 - 36.0 g/dL   RDW 16.0 (H) 11.5 - 15.5 %   Platelets 207 150 - 400 K/uL   Neutrophils Relative % 59 %   Neutro Abs 3.6 1.7 - 7.7 K/uL   Lymphocytes Relative 25 %   Lymphs Abs 1.6 0.7 - 4.0 K/uL   Monocytes Relative 12 %   Monocytes Absolute 0.8 0.1 - 1.0 K/uL   Eosinophils Relative 4 %   Eosinophils Absolute 0.3 0.0 - 0.7 K/uL   Basophils Relative 0 %   Basophils Absolute 0.0 0.0 - 0.1 K/uL  Comprehensive metabolic panel      Status: Abnormal   Collection Time: 06/06/17  7:43 AM  Result Value Ref Range   Sodium 134 (L) 135 - 145 mmol/L   Potassium 3.7 3.5 - 5.1 mmol/L   Chloride 99 (L) 101 - 111 mmol/L   CO2 27 22 - 32 mmol/L   Glucose, Bld 203 (H) 65 - 99 mg/dL   BUN 30 (H) 6 - 20 mg/dL   Creatinine, Ser 4.21 (H) 0.44 - 1.00 mg/dL   Calcium 8.4 (L) 8.9 - 10.3 mg/dL   Total Protein 6.3 (L) 6.5 - 8.1 g/dL   Albumin 2.5 (L) 3.5 - 5.0 g/dL   AST 18 15 - 41 U/L   ALT <5 (L) 14 - 54 U/L   Alkaline Phosphatase 69 38 - 126 U/L   Total Bilirubin 0.4 0.3 - 1.2 mg/dL   GFR calc non Af Amer 9 (L) >60 mL/min   GFR calc Af Amer 11 (L) >60 mL/min    Comment: (NOTE) The eGFR has been calculated using the CKD EPI equation. This calculation has not been validated in all clinical situations. eGFR's persistently <60 mL/min signify possible Chronic Kidney Disease.    Anion gap 8 5 - 15  Magnesium     Status: Abnormal   Collection Time: 06/06/17  7:43 AM  Result Value Ref Range   Magnesium 1.6 (L) 1.7 - 2.4 mg/dL  Phosphorus     Status: None   Collection Time: 06/06/17  7:43 AM  Result Value Ref Range   Phosphorus 2.6 2.5 - 4.6 mg/dL  Lactic acid, plasma     Status: None   Collection Time: 06/06/17  8:11 AM  Result Value Ref Range   Lactic Acid, Venous 0.7 0.5 - 1.9 mmol/L  Glucose, capillary     Status: Abnormal   Collection Time: 06/06/17  8:14 AM  Result Value Ref Range   Glucose-Capillary 176 (H) 65 - 99 mg/dL  Glucose, capillary     Status: Abnormal   Collection Time: 06/06/17 11:46 AM  Result Value Ref Range   Glucose-Capillary 140 (H) 65 - 99 mg/dL  Urinalysis, Routine w reflex microscopic     Status: Abnormal   Collection Time: 06/06/17  4:36 PM  Result Value Ref Range   Color, Urine AMBER (A) YELLOW    Comment: BIOCHEMICALS MAY BE AFFECTED BY COLOR   APPearance CLOUDY (A) CLEAR   Specific Gravity, Urine 1.018 1.005 - 1.030   pH 5.0  5.0 - 8.0   Glucose, UA NEGATIVE NEGATIVE mg/dL   Hgb urine  dipstick SMALL (A) NEGATIVE   Bilirubin Urine NEGATIVE NEGATIVE   Ketones, ur NEGATIVE NEGATIVE mg/dL   Protein, ur 100 (A) NEGATIVE mg/dL   Nitrite NEGATIVE NEGATIVE   Leukocytes, UA NEGATIVE NEGATIVE   RBC / HPF 0-5 0 - 5 RBC/hpf   WBC, UA 0-5 0 - 5 WBC/hpf   Bacteria, UA RARE (A) NONE SEEN   Squamous Epithelial / LPF 0-5 (A) NONE SEEN   Mucus PRESENT    Amorphous Crystal PRESENT   Glucose, capillary     Status: None   Collection Time: 06/06/17  5:23 PM  Result Value Ref Range   Glucose-Capillary 94 65 - 99 mg/dL  Ammonia     Status: None   Collection Time: 06/06/17  7:50 PM  Result Value Ref Range   Ammonia 19 9 - 35 umol/L  Glucose, capillary     Status: Abnormal   Collection Time: 06/06/17  8:56 PM  Result Value Ref Range   Glucose-Capillary 220 (H) 65 - 99 mg/dL  Protime-INR     Status: Abnormal   Collection Time: 06/07/17  4:20 AM  Result Value Ref Range   Prothrombin Time 16.0 (H) 11.4 - 15.2 seconds   INR 1.29   CBC     Status: Abnormal   Collection Time: 06/07/17  4:20 AM  Result Value Ref Range   WBC 5.7 4.0 - 10.5 K/uL   RBC 3.55 (L) 3.87 - 5.11 MIL/uL   Hemoglobin 9.6 (L) 12.0 - 15.0 g/dL   HCT 30.5 (L) 36.0 - 46.0 %   MCV 85.9 78.0 - 100.0 fL   MCH 27.0 26.0 - 34.0 pg   MCHC 31.5 30.0 - 36.0 g/dL   RDW 16.0 (H) 11.5 - 15.5 %   Platelets 227 150 - 400 K/uL  Basic metabolic panel     Status: Abnormal   Collection Time: 06/07/17  4:20 AM  Result Value Ref Range   Sodium 134 (L) 135 - 145 mmol/L   Potassium 3.8 3.5 - 5.1 mmol/L   Chloride 98 (L) 101 - 111 mmol/L   CO2 26 22 - 32 mmol/L   Glucose, Bld 182 (H) 65 - 99 mg/dL   BUN 40 (H) 6 - 20 mg/dL   Creatinine, Ser 5.01 (H) 0.44 - 1.00 mg/dL   Calcium 8.8 (L) 8.9 - 10.3 mg/dL   GFR calc non Af Amer 8 (L) >60 mL/min   GFR calc Af Amer 9 (L) >60 mL/min    Comment: (NOTE) The eGFR has been calculated using the CKD EPI equation. This calculation has not been validated in all clinical situations. eGFR's  persistently <60 mL/min signify possible Chronic Kidney Disease.    Anion gap 10 5 - 15  Magnesium     Status: None   Collection Time: 06/07/17  4:20 AM  Result Value Ref Range   Magnesium 2.1 1.7 - 2.4 mg/dL    Recent Results (from the past 240 hour(s))  Culture, blood (Routine X 2) w Reflex to ID Panel     Status: None   Collection Time: 05/31/17 10:23 PM  Result Value Ref Range Status   Specimen Description BLOOD RIGHT ARM  Final   Special Requests IN PEDIATRIC BOTTLE Blood Culture adequate volume  Final   Culture NO GROWTH 5 DAYS  Final   Report Status 06/06/2017 FINAL  Final  Culture, blood (Routine X 2) w Reflex to ID  Panel     Status: None   Collection Time: 05/31/17 10:45 PM  Result Value Ref Range Status   Specimen Description BLOOD RIGHT HAND  Final   Special Requests IN PEDIATRIC BOTTLE Blood Culture adequate volume  Final   Culture NO GROWTH 5 DAYS  Final   Report Status 06/06/2017 FINAL  Final  MRSA PCR Screening     Status: None   Collection Time: 06/01/17  7:14 PM  Result Value Ref Range Status   MRSA by PCR NEGATIVE NEGATIVE Final    Comment:        The GeneXpert MRSA Assay (FDA approved for NASAL specimens only), is one component of a comprehensive MRSA colonization surveillance program. It is not intended to diagnose MRSA infection nor to guide or monitor treatment for MRSA infections.     Lipid Panel No results for input(s): CHOL, TRIG, HDL, CHOLHDL, VLDL, LDLCALC in the last 72 hours.  Studies/Results: Ct Head Code Stroke Wo Contrast  Result Date: 06/06/2017 CLINICAL DATA:  Altered mental status after dialysis. The initial code stroke designation was subsequently canceled by the neurologist. EXAM: CT HEAD WITHOUT CONTRAST TECHNIQUE: Contiguous axial images were obtained from the base of the skull through the vertex without intravenous contrast. COMPARISON:  Brain MRI 04/27/2017 FINDINGS: Brain: There is no evidence of acute infarct, intracranial  hemorrhage, mass, midline shift, or extra-axial fluid collection. Mild cerebral atrophy is within normal limits for age. There is minimal chronic small vessel ischemic disease for age. Vascular: Calcified atherosclerosis at the skullbase. No hyperdense vessel. Skull: No fracture. Partially visualized sclerosis in the left maxilla, likely dental related. Sinuses/Orbits: Mild left maxillary sinus mucosal thickening. Small left mastoid effusion. Left cataract extraction. Other: None. IMPRESSION: No evidence of acute intracranial abnormality. Electronically Signed   By: Logan Bores M.D.   On: 06/06/2017 07:57    Medications:  Scheduled: . carvedilol  6.25 mg Oral BID WC  . darbepoetin (ARANESP) injection - DIALYSIS  100 mcg Intravenous Q Mon-HD  . docusate sodium  100 mg Oral BID  . doxercalciferol  4 mcg Intravenous Q M,W,F-HD  . gabapentin  300 mg Oral QHS  . insulin aspart  0-5 Units Subcutaneous QHS  . insulin aspart  0-9 Units Subcutaneous TID WC  . insulin glargine  3 Units Subcutaneous QHS  . mouth rinse  15 mL Mouth Rinse BID  . midodrine  10 mg Oral Q M,W,F-HD  . multivitamin  1 tablet Oral QHS  . pantoprazole  40 mg Oral Daily  . PARoxetine  10 mg Oral Daily  . simvastatin  40 mg Oral q1800  . sucralfate  1 g Oral TID WC & HS  . Warfarin - Pharmacist Dosing Inpatient   Does not apply q1800   Continuous: . sodium chloride    . sodium chloride    . sodium chloride    . heparin 1,000 Units/hr (06/06/17 1049)   LKT:GYBWLS chloride, sodium chloride, acetaminophen, alteplase, camphor-menthol, feeding supplement (NEPRO CARB STEADY), guaiFENesin, heparin, hydrALAZINE, hydrOXYzine, lidocaine (PF), lidocaine-prilocaine, linaclotide, methocarbamol, ondansetron **OR** ondansetron (ZOFRAN) IV, oxyCODONE-acetaminophen, pentafluoroprop-tetrafluoroeth, phenol, senna-docusate, sodium chloride flush, triamcinolone cream    EEG 06/06/17:  Impression: This predominantly drowsy and asleep EEG is  abnormal due to mild diffuse slowing of the waking background with slowing of the posterior dominant rhythm.  Clinical Correlation of the above findings indicates diffuse cerebral dysfunction that is non-specific in etiology and can be seen with hypoxic/ischemic injury, toxic/metabolic encephalopathies, neurodegenerative disorders, medication effect, or due to  excessive drowsiness.    Assessment: 76 y.o. female with acute onset of AMS following dialysis 1. EEG most consistent with a diffuse encephalopathy. Most likely components of the DDx are toxic and metabolic, with dialysis disequilibrium syndrome being a significant differential diagnostic consideration. Severe pain can also result in a transient delirium, which tends to resolve with pain relief. Acute thiamine deficiency has been documented in the literature to occur rarely in patients on dialysis. Ammonia level is normal.  2. Status post evacuation of left thigh hematoma 3. ESRD on HD 4. History of DVT with protein C deficiency. On Coumadin at home.  5. Recently required Narcan on 12/17 due to confusion 6. Fever of unknown origin  Recommendations: 1. Pain management.  2. Encephalopathy work up to include evaluation for possible infectious, metabolic and toxic etiologies.  3. Continue anticoagulation 4. Thiamine level pending 5. Start high dose thiamine 250 mg IV TID x 3 days then 100 mg po qd thereafter.  6. Neurology will sign off. Please call if there are additional questions.    LOS: 7 days   @Electronically  signed: Dr. Kerney Elbe 06/07/2017  7:19 AM

## 2017-06-07 NOTE — Progress Notes (Signed)
[facility]  Subjective  - wound vac home ordered signed on chart.   Objective (!) 107/47 63 99 F (37.2 C) (Axillary) 11 100%  Intake/Output Summary (Last 24 hours) at 06/07/2017 1010 Last data filed at 06/07/2017 0900 Gross per 24 hour  Intake 870 ml  Output 250 ml  Net 620 ml    Left thigh palpable thrill in graft, wound vac intact to suction. B LE doppler PT/DP  Assessment/Planning: 76 y.o.femaleis s/p evacuation of left thigh hematoma5 Days Post-Op   Tm 99.  Left thigh without erythema or wound edema. Wound vac changes M-W-F HD M-W-F  Roxy Horseman 06/07/2017 10:10 AM --  Laboratory Lab Results: Recent Labs    06/06/17 0743 06/07/17 0420  WBC 6.2 5.7  HGB 10.0* 9.6*  HCT 31.3* 30.5*  PLT 207 227   BMET Recent Labs    06/06/17 0743 06/07/17 0420  NA 134* 134*  K 3.7 3.8  CL 99* 98*  CO2 27 26  GLUCOSE 203* 182*  BUN 30* 40*  CREATININE 4.21* 5.01*  CALCIUM 8.4* 8.8*    COAG Lab Results  Component Value Date   INR 1.29 06/07/2017   INR 1.20 06/06/2017   INR 1.19 06/05/2017   No results found for: PTT

## 2017-06-07 NOTE — Progress Notes (Signed)
CSW received message that dtr would like call from [place]  CSW called pt dtr back- dtr wanting to discuss concerns that medical staff was not updating her on pt condition.  States that she calls for updates and is often informed that the RN is busy and will give her a call back only to never receive a call.  CSW paged MD to request they call pt dtr to give updates  CSW confirmed with dtr that plan is currently to take patient home  Jorge Ny, [place] Social Worker [phone]

## 2017-06-07 NOTE — Care Management Note (Addendum)
Case Management Note  Patient Details  Name: Ashlee Miles MRN: [id number] Date of Birth: 04-May-1941  Subjective/Objective:     From home , active with [facility] for [facility], HHPT, she is made [facility]  Per Butch Penny with [facility].  Presents with  Left groin hematoma, ESRD hx MWF, has prot c def, anemia of ckd, chronic pain syndrome.                  12/19 [place], BSN- NCM faxed wound vac information to Ezra Sites with [employer].  Patient plans to go home with [facility] for HHPT, [place]. Waiting for wound vac approval and delivery.    12/20 [place] ,BSN- wound vac has been approved, it will be delivered to patient's room today.                  Action/Plan: NCM will follow for dc needs.   Expected Discharge Date:                  Expected Discharge Plan:  [place]  In-House Referral:     Discharge planning Services  CM Consult  Post Acute Care Choice:  Home Health, Resumption of Svcs/PTA Provider Choice offered to:  Patient  DME Arranged:  Vac DME Agency:  [employer]  HH Arranged:  PT, RN [place] Agency:  [place]  Status of Service:  Completed, signed off  If discussed at [place] of Stay Meetings, dates discussed:    Additional Comments:  Zenon Mayo, RN 06/07/2017, 4:48 PM

## 2017-06-07 NOTE — Progress Notes (Addendum)
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  Patient underwent hematoma evacuation 12/14. Patient has had episodes of altered mental status.   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 Will need wound vac changes MWF   Acute encephalopathy, ?delirium  -Required narcan 12/17 due to confusion.  -Episode of mental status change again morning of 12/18, code stroke called. CT head negative for acute intracranial abnormality. EEG 12/18 showed mild diffuse slowing without epileptiform discharges  -Neurology ordered IV thiamine  -Resolved today   Fever  -101.5 F 12/18, without sepsis. No leukocytosis or lactic acidosis. UA negative for UTI. Left thigh wound vac and thigh does not appear to be source of infection. Afebrile over last 24 hours now.   History of DVT and protein C deficiency -Coumadin and heparin gtt.  Monitor INR, CBC   Anemia due to chronic kidney disease, acute blood loss from  hematoma -Stable   Hypertension -Continue Coreg  ESRD on hemodialysis -Nephrology following   Chronic diastolic congestive heart failure -Stable   Diabetes with ESRD -Continue lantus, novolog SSI   GERD -Continue Protonix  Anxiety depression -Continue paxil   Constipation -Complained of abdominal pain, no bowel movements charted since admission.  Abdominal x-ray with large stool burden.  Bowel regimen ordered.  Stage II Right Buttock, POA  -Local wound care    DVT prophylaxis: Coumadin, IV heparin Code Status: DNR Family Communication: Updated daughter over phone 12/19 Disposition Plan: SNF recommended, daughter refused    Consultants:   Nephrology  Vascular surgery  Neurology  Procedures:   Left thigh hematoma evacuation 12/14 with Dr. Trula Slade   Antimicrobials:  Anti-infectives (From admission, onward)   Start     Dose/Rate Route Frequency Ordered Stop   06/02/17 1728  vancomycin (VANCOCIN) powder  Status:  Discontinued       As needed 06/02/17 1729 06/02/17 1813   06/02/17 1615  ceFAZolin (ANCEF) IVPB 2g/100 mL premix  Status:  Discontinued     2 g 200 mL/hr over 30 Minutes Intravenous  Once 06/02/17 1608 06/02/17 2030   06/01/17 2200  ceFAZolin (ANCEF) IVPB 1 g/50 mL premix  Status:  Discontinued     1 g 100 mL/hr over 30 Minutes Intravenous Every 24 hours 05/31/17 2100 06/04/17 1204   06/01/17 1100  ceFAZolin (ANCEF) IVPB 1 g/50 mL premix  Status:  Discontinued    Comments:  Send with pt to OR   1 g 100 mL/hr over 30 Minutes Intravenous To [facility] 05/31/17 1714  06/01/17 1149   05/31/17 2130  ceFAZolin (ANCEF) IVPB 2g/100 mL premix     2 g 200 mL/hr over 30 Minutes Intravenous  Once 05/31/17 2055 05/31/17 2330      Subjective: Continues to complain of left thigh pain.  She is alert and oriented, remembers me from examination yesterday.  Complains of some generalized abdominal pain without nausea or  vomiting.  Objective: Vitals:   06/07/17 0800 06/07/17 0900 06/07/17 1000 06/07/17 1100  BP: (!) 134/52 124/90 (!) 129/56 121/70  Pulse: 66 74 60 67  Resp: 15 13 11  (!) 9  Temp:    98.3 F (36.8 C)  TempSrc:    Oral  SpO2: 100% 100% 100% 100%  Weight:      Height:        Intake/Output Summary (Last 24 hours) at 06/07/2017 1239 Last data filed at 06/07/2017 0900 Gross per 24 hour  Intake 870 ml  Output 250 ml  Net 620 ml   Filed Weights   06/05/17 2315 06/06/17 0313 06/07/17 0431  Weight: 104.7 kg (230 lb 13.2 oz) 102.2 kg (225 lb 5 oz) 104 kg (229 lb 4.5 oz)    Examination:  General exam: Appears calm and comfortable  Respiratory system: Clear to auscultation. Respiratory effort normal. Cardiovascular system: S1 & S2 heard, RRR. No JVD, murmurs, rubs, gallops or clicks. No pedal edema. Gastrointestinal system: Abdomen is nondistended, soft and TTP umbilical. No organomegaly or masses felt. Normal bowel sounds heard. Central nervous system: Alert and oriented to self and place, no focal neuro deficits, CN 2-12 grossly intact, moves all extremities  Extremities: Symmetric,+left thigh wound vac  Psychiatry: Judgement and insight appear normal  Data Reviewed: I have personally reviewed following labs and imaging studies  CBC: Recent Labs  Lab 05/31/17 1618  06/02/17 2223 06/04/17 0404 06/05/17 0400 06/06/17 0743 06/07/17 0420  WBC 9.5   < > 12.1* 8.3 6.7 6.2 5.7  NEUTROABS 7.5  --   --   --   --  3.6  --   HGB 7.0*   < > 12.1 10.3* 10.2* 10.0* 9.6*  HCT 22.1*   < > 33.4* 31.5* 31.3* 31.3* 30.5*  MCV 79.5   < > 82.1 83.3 85.3 86.7 85.9  PLT 322   < > 197 202 206 207 227   < > = values in this interval not displayed.   Basic Metabolic Panel: Recent Labs  Lab 06/01/17 2226 06/02/17 1700 06/04/17 1123 06/05/17 0400 06/06/17 0743 06/07/17 0420  NA 139 145 135 136 134* 134*  K 3.9 4.0 3.9 4.2 3.7 3.8  CL 109  --  101 99* 99* 98*  CO2 24  --  26 27 27 26    GLUCOSE 207* 80 140* 191* 203* 182*  BUN 47*  --  31* 37* 30* 40*  CREATININE 4.72*  --  3.82* 4.69* 4.21* 5.01*  CALCIUM 8.1*  --  8.5* 8.9 8.4* 8.8*  MG  --   --   --   --  1.6* 2.1  PHOS  --   --  3.2  --  2.6  --    GFR: Estimated Creatinine Clearance: 10.6 mL/min (A) (by C-G formula based on SCr of 5.01 mg/dL (H)). Liver Function Tests: Recent Labs  Lab 05/31/17 1618 06/04/17 1123 06/06/17 0743  AST 17  --  18  ALT 8*  --  <5*  ALKPHOS 77  --  69  BILITOT 0.8  --  0.4  PROT 6.6  --  6.3*  ALBUMIN 2.9* 2.4* 2.5*   No results for input(s): LIPASE, AMYLASE in the last 168 hours. Recent Labs  Lab 06/06/17 1950  AMMONIA 19   Coagulation Profile: Recent Labs  Lab 06/01/17 2226 06/04/17 1123 06/05/17 0400 06/06/17 0432 06/07/17 0420  INR 1.36 1.30 1.19 1.20 1.29   Cardiac Enzymes: No results for input(s): CKTOTAL, CKMB, CKMBINDEX, TROPONINI in the last 168 hours. BNP (last 3 results) No results for input(s): PROBNP in the last 8760 hours. HbA1C: No results for input(s): HGBA1C in the last 72 hours. CBG: Recent Labs  Lab 06/06/17 1146 06/06/17 1723 06/06/17 2056 06/07/17 0749 06/07/17 1137  GLUCAP 140* 94 220* 159* 138*   Lipid Profile: No results for input(s): CHOL, HDL, LDLCALC, TRIG, CHOLHDL, LDLDIRECT in the last 72 hours. Thyroid Function Tests: No results for input(s): TSH, T4TOTAL, FREET4, T3FREE, THYROIDAB in the last 72 hours. Anemia Panel: No results for input(s): VITAMINB12, FOLATE, FERRITIN, TIBC, IRON, RETICCTPCT in the last 72 hours. Sepsis Labs: Recent Labs  Lab 05/31/17 1631 06/06/17 0811  LATICACIDVEN 1.74 0.7    Recent Results (from the past 240 hour(s))  Culture, blood (Routine X 2) w Reflex to ID Panel     Status: None   Collection Time: 05/31/17 10:23 PM  Result Value Ref Range Status   Specimen Description BLOOD RIGHT ARM  Final   Special Requests IN PEDIATRIC BOTTLE Blood Culture adequate volume  Final   Culture NO GROWTH  5 DAYS  Final   Report Status 06/06/2017 FINAL  Final  Culture, blood (Routine X 2) w Reflex to ID Panel     Status: None   Collection Time: 05/31/17 10:45 PM  Result Value Ref Range Status   Specimen Description BLOOD RIGHT HAND  Final   Special Requests IN PEDIATRIC BOTTLE Blood Culture adequate volume  Final   Culture NO GROWTH 5 DAYS  Final   Report Status 06/06/2017 FINAL  Final  MRSA PCR Screening     Status: None   Collection Time: 06/01/17  7:14 PM  Result Value Ref Range Status   MRSA by PCR NEGATIVE NEGATIVE Final    Comment:        The GeneXpert MRSA Assay (FDA approved for NASAL specimens only), is one component of a comprehensive MRSA colonization surveillance program. It is not intended to diagnose MRSA infection nor to guide or monitor treatment for MRSA infections.        Radiology Studies: Dg Abd 1 View  Result Date: 06/07/2017 CLINICAL DATA:  Abdominal pain and nausea. EXAM: ABDOMEN - 1 VIEW COMPARISON:  None. FINDINGS: The bowel gas pattern is nonobstructive. There is a large volume of stool throughout the colon. IVC filter is noted. No abnormal abdominal calcification or focal bony abnormality. IMPRESSION: No acute finding. Large stool burden throughout the colon. Electronically Signed   By: Inge Rise M.D.   On: 06/07/2017 09:54   Ct Head Code Stroke Wo Contrast  Result Date: 06/06/2017 CLINICAL DATA:  Altered mental status after dialysis. The initial code stroke designation was subsequently canceled by the neurologist. EXAM: CT HEAD WITHOUT CONTRAST TECHNIQUE: Contiguous axial images were obtained from the base of the skull through the vertex without intravenous contrast. COMPARISON:  Brain MRI 04/27/2017 FINDINGS: Brain: There is no evidence of acute infarct, intracranial hemorrhage, mass, midline shift, or extra-axial fluid collection. Mild cerebral atrophy is within normal limits for age. There is minimal chronic small vessel ischemic disease for  age. Vascular: Calcified atherosclerosis at the  skullbase. No hyperdense vessel. Skull: No fracture. Partially visualized sclerosis in the left maxilla, likely dental related. Sinuses/Orbits: Mild left maxillary sinus mucosal thickening. Small left mastoid effusion. Left cataract extraction. Other: None. IMPRESSION: No evidence of acute intracranial abnormality. Electronically Signed   By: Logan Bores M.D.   On: 06/06/2017 07:57      Scheduled Meds: . carvedilol  6.25 mg Oral BID WC  . darbepoetin (ARANESP) injection - DIALYSIS  100 mcg Intravenous Q Mon-HD  . docusate sodium  100 mg Oral BID  . doxercalciferol  4 mcg Intravenous Q M,W,F-HD  . gabapentin  300 mg Oral QHS  . insulin aspart  0-5 Units Subcutaneous QHS  . insulin aspart  0-9 Units Subcutaneous TID WC  . insulin glargine  3 Units Subcutaneous QHS  . mouth rinse  15 mL Mouth Rinse BID  . midodrine  10 mg Oral Q M,W,F-HD  . multivitamin  1 tablet Oral QHS  . pantoprazole  40 mg Oral Daily  . PARoxetine  10 mg Oral Daily  . polyethylene glycol  17 g Oral Daily  . senna-docusate  1 tablet Oral QHS  . simvastatin  40 mg Oral q1800  . sucralfate  1 g Oral TID WC & HS  . warfarin  3 mg Oral ONCE-1800  . Warfarin - Pharmacist Dosing Inpatient   Does not apply q1800   Continuous Infusions: . sodium chloride    . sodium chloride    . sodium chloride    . heparin 1,000 Units/hr (06/07/17 0830)  . sodium chloride 0.9 % 50 mL with thiamine (B-1) 250 mg infusion 100 mL/hr at 06/07/17 1108     LOS: 7 days    Time spent: 30 minutes   Dessa Phi, DO [facility] [email or web address] Password [facility] 06/07/2017, 12:39 PM

## 2017-06-07 NOTE — Care Management Important Message (Signed)
Important Message  Patient Details  Name: Ashlee Miles MRN: [id number] Date of Birth: [birth year]   Medicare Important Message Given:  Yes    Nathen May 06/07/2017, 9:13 AM

## 2017-06-07 NOTE — Progress Notes (Signed)
Physical Therapy Treatment Patient Details Name: Ashlee Miles MRN: [id number] DOB: March 21, 1941 Today's Date: 06/07/2017    History of Present Illness Ashlee Miles is a 76 y.o. 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 Pt is s/p THROMBECTOMY AND REVISION OF ARTERIOVENTOUS (AV) GORETEX  GRAFT LEFT THIGH (Left) as a surgical intervention as well as evacuation of hematoma with VAC placement.     PT Comments    Pt admitted with above diagnosis. Pt currently with functional limitations due to balance and endurance deficits. Pt was able to progress ambulation and take a few steps today.  Progressing.  Pt will benefit from skilled PT to increase their independence and safety with mobility to allow discharge to the venue listed below.    Follow Up Recommendations  SNF;Supervision/Assistance - 24 hour     Equipment Recommendations  None recommended by PT    Recommendations for Other Services       Precautions / Restrictions Precautions Precautions: Fall Precaution Comments: wound vac LLE Restrictions Weight Bearing Restrictions: No    Mobility  Bed Mobility Overal bed mobility: Needs Assistance Bed Mobility: Supine to Sit     Supine to sit: Mod assist;+2 for physical assistance;+2 for safety/equipment;HOB elevated(heavy mod)     General bed mobility comments: mod A to bring legs EOB; and for trunk elevation  Transfers Overall transfer level: Needs assistance Equipment used: Rolling walker (2 wheeled) Transfers: Sit to/from [facility] Sit to Stand: Mod assist;+2 physical assistance;+2 safety/equipment         General transfer comment: Pt able to power up with mod A +2  Ambulation/Gait Ambulation/Gait  assistance: Min assist;+2 safety/equipment Ambulation Distance (Feet): 8 Feet Assistive device: Rolling walker (2 wheeled) Gait Pattern/deviations: Step-through pattern;Decreased stride length;Antalgic;Trunk flexed;Wide base of support   Gait velocity interpretation: Below normal speed for age/gender General Gait Details: Pt progressed ambulation today with RW.  Taking steps with cues and supporiting self on RW.    Stairs            Wheelchair Mobility    Modified Rankin (Stroke Patients Only)       Balance Overall balance assessment: Needs assistance Sitting-balance support: Feet supported;No upper extremity supported Sitting balance-[name]: Good Sitting balance - Comments: sitting EOB with no back support   Standing balance support: Bilateral upper extremity supported Standing balance-[name]: Poor Standing balance comment: reliant on RW and external support from therapists                            Cognition Arousal/Alertness: Awake/alert Behavior During Therapy: WFL for tasks assessed/performed Overall Cognitive Status: Within Functional Limits for tasks assessed                                        Exercises General Exercises - Lower Extremity Ankle Circles/Pumps: AROM;Both;5 reps;Seated Long Arc Quad: AROM;Both;5 reps;Seated Heel Slides: AROM;Both;5 reps;Supine    General Comments General comments (skin integrity, edema, etc.): VAC in place left thigh      Pertinent Vitals/Pain Pain Assessment: Faces Faces Pain Scale: Hurts whole lot Pain Location: L Hip and LE  Pain Descriptors / Indicators: Discomfort;Grimacing;Sore;Tender Pain Intervention(s):  Limited activity within patient's tolerance;Monitored during session;Premedicated before session;Repositioned   VSS Home Living                      Prior Function            PT Goals (current goals can now be found in the care plan section) Acute Rehab PT  Goals Patient Stated Goal: to get better Progress towards PT goals: Progressing toward goals    Frequency    Min 3X/week      PT Plan Current plan remains appropriate    Co-evaluation              AM-PAC PT "6 Clicks" Daily Activity  Outcome Measure  Difficulty turning over in bed (including adjusting bedclothes, sheets and blankets)?: Unable Difficulty moving from lying on back to sitting on the side of the bed? : Unable Difficulty sitting down on and standing up from a chair with arms (e.g., wheelchair, bedside commode, etc,.)?: Unable Help needed moving to and from a bed to chair (including a wheelchair)?: A Lot Help needed walking in hospital room?: A Lot Help needed climbing 3-5 steps with a railing? : Total 6 Click Score: 8    End of Session Equipment Utilized During Treatment: Gait belt;Oxygen Activity Tolerance: Patient limited by fatigue;Patient limited by pain Patient left: in chair;with call bell/phone within reach;with chair alarm set Nurse Communication: Mobility status PT Visit Diagnosis: Unsteadiness on feet (R26.81);Muscle weakness (generalized) (M62.81);Pain Pain - Right/Left: Left Pain - part of body: Leg     Time: 1110-1135 PT Time Calculation (min) (ACUTE ONLY): 25 min  Charges:  $Gait Training: 8-22 mins $Therapeutic Activity: 8-22 mins                    G Codes:       Ellisyn Icenhower,PT Acute Rehabilitation [phone] (pager)    Denice Paradise 06/07/2017, 2:29 PM

## 2017-06-07 NOTE — Progress Notes (Signed)
ANTICOAGULATION CONSULT NOTE - Follow-Up  Pharmacy Consult for Heparin + Warfarin Indication: hx DVT, protein C deficiency  No Known Allergies  Patient Measurements: Height: 5' 0.98" (154.9 cm) Weight: 229 lb 4.5 oz (104 kg) IBW/kg (Calculated) : 47.76  Heparin wt: 73 kg  Assessment: 76 y/o female on Coumadin 5mg  daily PTA for hx protein C def/DVT. Admit INR 4 on 12/12 with Hgb 7. Noted to have massive L-thigh hematoma of her L-fem AVGG. INR reversed with Vit K 10 mg IV x 1 and given FFP x 2 + PRBC x 9. Hematoma evacuated 12/14 - VVS inspected the wound and there was no active bleeding. Coumadin restarted 12/16, and started heparin bridge 12/17. INR 1.29 today, heparin was turned off last night due to unknown reason, will restart this morning. Heparin level this morning is not accurate. Hgb 9.6, plts wnl.   Goal of Therapy:  INR 2-3 Heparin level 0.3-0.5 units/ml Monitor platelets by anticoagulation protocol: Yes   Plan:  Continue heparin gtt at 1,000 units/hr Give Coumadin 3mg  PO x 1 Monitor daily heparin level, CBC, s/s of bleed  Elenor Quinones, PharmD, [facility] Clinical Pharmacist Pager [phone] 06/07/2017 8:34 AM

## 2017-06-07 NOTE — Plan of Care (Signed)
The pt's pain seems to be doing much better, they are able to move their left leg a little bit more and can handle having their leg touch better than they could before.

## 2017-06-07 NOTE — Progress Notes (Signed)
[facility] ROUNDING NOTE   Subjective:   Interval History:76 year old female with history of hypertension, diabetes, ESRD on hemodialysis, DVT, protein C deficiency on Coumadin, pituitary adenoma, chronic diastolic congestive heart failure, GERD, depression presented with left groin pain and left leg swelling found to have left groin hematoma. Patient underwent hematoma evacuation she was doing well and hoping to be discharged when she started to have some symptoms of altered mental status and was treated with thiamine  :[facility] MWF 4h 3K/2.25 bath 98.5kg Hep 3000 L thigh AVG -hect 4  -Fe thru 12/14  -mircera 150 last dose 11/2       Objective:  Vital signs in last 24 hours:  Temp:  [97.9 F (36.6 C)-99.8 F (37.7 C)] 99 F (37.2 C) (12/19 0712) Pulse Rate:  [54-75] 63 (12/19 0600) Resp:  [8-17] 11 (12/19 0600) BP: (102-132)/(43-78) 107/47 (12/19 0600) SpO2:  [87 %-100 %] 100 % (12/19 0600) Weight:  [229 lb 4.5 oz (104 kg)] 229 lb 4.5 oz (104 kg) (12/19 0431)  Weight change: -8.7 oz (-0.7 kg) Filed Weights   06/05/17 2315 06/06/17 0313 06/07/17 0431  Weight: 230 lb 13.2 oz (104.7 kg) 225 lb 5 oz (102.2 kg) 229 lb 4.5 oz (104 kg)    Intake/Output: I/O last 3 completed shifts: In: 69 [P.O.:240; I.V.:640] Out: 3016 [Urine:450; Drains:100; Other:2466]   Intake/Output this shift:  Total I/O In: 100 [P.O.:100] Out: -    Obese AAF, no distress No jvd Chest clear  RRR no mrg ABd soft obese ntnd Ext L thigh marked edema, very tender. No RLE edema.  NF, ox 3 L thigh AVG +bruit     Basic Metabolic Panel: Recent Labs  Lab 06/01/17 2226 06/02/17 1700 06/04/17 1123 06/05/17 0400 06/06/17 0743 06/07/17 0420  NA 139 145 135 136 134* 134*  K 3.9 4.0 3.9 4.2 3.7 3.8  CL 109  --  101 99* 99* 98*  CO2 24  --  26 27 27 26   GLUCOSE 207* 80 140* 191* 203* 182*  BUN 47*  --  31* 37* 30* 40*  CREATININE 4.72*  --  3.82* 4.69* 4.21* 5.01*   CALCIUM 8.1*  --  8.5* 8.9 8.4* 8.8*  MG  --   --   --   --  1.6* 2.1  PHOS  --   --  3.2  --  2.6  --     Liver Function Tests: Recent Labs  Lab 05/31/17 1618 06/04/17 1123 06/06/17 0743  AST 17  --  18  ALT 8*  --  <5*  ALKPHOS 77  --  69  BILITOT 0.8  --  0.4  PROT 6.6  --  6.3*  ALBUMIN 2.9* 2.4* 2.5*   No results for input(s): LIPASE, AMYLASE in the last 168 hours. Recent Labs  Lab 06/06/17 1950  AMMONIA 19    CBC: Recent Labs  Lab 05/31/17 1618  06/02/17 2223 06/04/17 0404 06/05/17 0400 06/06/17 0743 06/07/17 0420  WBC 9.5   < > 12.1* 8.3 6.7 6.2 5.7  NEUTROABS 7.5  --   --   --   --  3.6  --   HGB 7.0*   < > 12.1 10.3* 10.2* 10.0* 9.6*  HCT 22.1*   < > 33.4* 31.5* 31.3* 31.3* 30.5*  MCV 79.5   < > 82.1 83.3 85.3 86.7 85.9  PLT 322   < > 197 202 206 207 227   < > = values in this interval not  displayed.    Cardiac Enzymes: No results for input(s): CKTOTAL, CKMB, CKMBINDEX, TROPONINI in the last 168 hours.  BNP: Invalid input(s): POCBNP  CBG: Recent Labs  Lab 06/06/17 0814 06/06/17 1146 06/06/17 1723 06/06/17 2056 06/07/17 0749  GLUCAP 176* 140* 94 220* 159*    Microbiology: Results for orders placed or performed during the hospital encounter of 05/31/17  Culture, blood (Routine X 2) w Reflex to ID Panel     Status: None   Collection Time: 05/31/17 10:23 PM  Result Value Ref Range Status   Specimen Description BLOOD RIGHT ARM  Final   Special Requests IN PEDIATRIC BOTTLE Blood Culture adequate volume  Final   Culture NO GROWTH 5 DAYS  Final   Report Status 06/06/2017 FINAL  Final  Culture, blood (Routine X 2) w Reflex to ID Panel     Status: None   Collection Time: 05/31/17 10:45 PM  Result Value Ref Range Status   Specimen Description BLOOD RIGHT HAND  Final   Special Requests IN PEDIATRIC BOTTLE Blood Culture adequate volume  Final   Culture NO GROWTH 5 DAYS  Final   Report Status 06/06/2017 FINAL  Final  MRSA PCR Screening      Status: None   Collection Time: 06/01/17  7:14 PM  Result Value Ref Range Status   MRSA by PCR NEGATIVE NEGATIVE Final    Comment:        The GeneXpert MRSA Assay (FDA approved for NASAL specimens only), is one component of a comprehensive MRSA colonization surveillance program. It is not intended to diagnose MRSA infection nor to guide or monitor treatment for MRSA infections.     Coagulation Studies: Recent Labs    06/04/17 1123 06/05/17 0400 06/06/17 0432 06/07/17 0420  LABPROT 16.0* 15.0 15.1 16.0*  INR 1.30 1.19 1.20 1.29    Urinalysis: Recent Labs    06/06/17 1636  COLORURINE AMBER*  LABSPEC 1.018  PHURINE 5.0  GLUCOSEU NEGATIVE  HGBUR SMALL*  BILIRUBINUR NEGATIVE  KETONESUR NEGATIVE  PROTEINUR 100*  NITRITE NEGATIVE  LEUKOCYTESUR NEGATIVE      Imaging: Dg Abd 1 View  Result Date: 06/07/2017 CLINICAL DATA:  Abdominal pain and nausea. EXAM: ABDOMEN - 1 VIEW COMPARISON:  None. FINDINGS: The bowel gas pattern is nonobstructive. There is a large volume of stool throughout the colon. IVC filter is noted. No abnormal abdominal calcification or focal bony abnormality. IMPRESSION: No acute finding. Large stool burden throughout the colon. Electronically Signed   By: Inge Rise M.D.   On: 06/07/2017 09:54   Ct Head Code Stroke Wo Contrast  Result Date: 06/06/2017 CLINICAL DATA:  Altered mental status after dialysis. The initial code stroke designation was subsequently canceled by the neurologist. EXAM: CT HEAD WITHOUT CONTRAST TECHNIQUE: Contiguous axial images were obtained from the base of the skull through the vertex without intravenous contrast. COMPARISON:  Brain MRI 04/27/2017 FINDINGS: Brain: There is no evidence of acute infarct, intracranial hemorrhage, mass, midline shift, or extra-axial fluid collection. Mild cerebral atrophy is within normal limits for age. There is minimal chronic small vessel ischemic disease for age. Vascular: Calcified  atherosclerosis at the skullbase. No hyperdense vessel. Skull: No fracture. Partially visualized sclerosis in the left maxilla, likely dental related. Sinuses/Orbits: Mild left maxillary sinus mucosal thickening. Small left mastoid effusion. Left cataract extraction. Other: None. IMPRESSION: No evidence of acute intracranial abnormality. Electronically Signed   By: Logan Bores M.D.   On: 06/06/2017 07:57     Medications:   .  sodium chloride    . sodium chloride    . sodium chloride    . heparin 1,000 Units/hr (06/07/17 0830)  . sodium chloride 0.9 % 50 mL with thiamine (B-1) 250 mg infusion     . carvedilol  6.25 mg Oral BID WC  . darbepoetin (ARANESP) injection - DIALYSIS  100 mcg Intravenous Q Mon-HD  . docusate sodium  100 mg Oral BID  . doxercalciferol  4 mcg Intravenous Q M,W,F-HD  . gabapentin  300 mg Oral QHS  . insulin aspart  0-5 Units Subcutaneous QHS  . insulin aspart  0-9 Units Subcutaneous TID WC  . insulin glargine  3 Units Subcutaneous QHS  . mouth rinse  15 mL Mouth Rinse BID  . midodrine  10 mg Oral Q M,W,F-HD  . multivitamin  1 tablet Oral QHS  . pantoprazole  40 mg Oral Daily  . PARoxetine  10 mg Oral Daily  . polyethylene glycol  17 g Oral Daily  . senna-docusate  1 tablet Oral QHS  . simvastatin  40 mg Oral q1800  . sucralfate  1 g Oral TID WC & HS  . warfarin  3 mg Oral ONCE-1800  . Warfarin - Pharmacist Dosing Inpatient   Does not apply q1800   sodium chloride, sodium chloride, acetaminophen, alteplase, camphor-menthol, feeding supplement (NEPRO CARB STEADY), guaiFENesin, heparin, hydrALAZINE, hydrOXYzine, lidocaine (PF), lidocaine-prilocaine, linaclotide, methocarbamol, ondansetron **OR** ondansetron (ZOFRAN) IV, oxyCODONE-acetaminophen, pentafluoroprop-tetrafluoroeth, phenol, sodium chloride flush, triamcinolone cream  Assessment/ Plan:  1. L thigh graft bleed / hematoma - sp hematoma evac 12/14. OK to use AV graft laterallimb  2. ESRD - HD MWF. K on  low side. No heparin for the next several days. 3. Prot C def - on coumadin at home 4. Anemia of CKD/ acute blood loss - better, Hb up 8- 10 range ,will give darbe 100 ug on Monday w HD 5. MBD of CKD - cont meds 6. Volume -isup 3-4 kg 7.   Onset of altered mental status  Receiving thiamine per neuro          LOS: 7 [place] W @TODAY @10 :37 AM

## 2017-06-07 NOTE — Progress Notes (Signed)
ANTICOAGULATION CONSULT NOTE - Follow-Up  Pharmacy Consult for Heparin + Warfarin Indication: hx DVT, protein C deficiency  No Known Allergies  Patient Measurements: Height: 5' 0.98" (154.9 cm) Weight: 224 lb 13.9 oz (102 kg) IBW/kg (Calculated) : 47.76  Heparin wt: 73 kg  Assessment: 77 y/o female on Coumadin 5mg  daily PTA for hx protein C def/DVT. Admit INR 4 on 12/12 with Hgb 7. Noted to have massive L-thigh hematoma of her L-fem AVGG. INR reversed with Vit K 10 mg IV x 1 and given FFP x 2 + PRBC x 9. Hematoma evacuated 12/14 - VVS inspected the wound and there was no active bleeding. Coumadin restarted 12/16, and started heparin bridge 12/17. INR 1.29 today.  Repeat HL this evening remains subtherapeutic on 1000 units/hr. RN reports no s/s of bleeding or interruptions in the infusion since last level.   Goal of Therapy:  INR 2-3 Heparin level 0.3-0.5 units/ml Monitor platelets by anticoagulation protocol: Yes   Plan:  Increase IV heparin to 1100 units/hr  F/u 8 hr HL  Monitor daily heparin level, CBC, s/s of bleed  Albertina Parr, PharmD., BCPS Clinical Pharmacist Pager [phone]

## 2017-06-07 NOTE — Progress Notes (Signed)
Pt witnessed by this nurse talking to people who are not there at approximately 2100. Pt believed that she was on the phone with her daughter [name].When I looked the phone was on the bedside table not in use. I was able to reorient the patient easily.   I received a call from [name] around 2330 and [name] was able to confirm that she hadn't spoken to the patient on the phone today.

## 2017-06-08 LAB — CBC
HCT: 28.7 % — ABNORMAL LOW (ref 36.0–46.0)
Hemoglobin: 9.1 g/dL — ABNORMAL LOW (ref 12.0–15.0)
MCH: 27.4 pg (ref 26.0–34.0)
MCHC: 31.7 g/dL (ref 30.0–36.0)
MCV: 86.4 fL (ref 78.0–100.0)
Platelets: 201 10*3/uL (ref 150–400)
RBC: 3.32 MIL/uL — ABNORMAL LOW (ref 3.87–5.11)
RDW: 16.1 % — ABNORMAL HIGH (ref 11.5–15.5)
WBC: 5.4 10*3/uL (ref 4.0–10.5)

## 2017-06-08 LAB — BASIC METABOLIC PANEL
Anion gap: 6 (ref 5–15)
BUN: 21 mg/dL — ABNORMAL HIGH (ref 6–20)
CO2: 29 mmol/L (ref 22–32)
Calcium: 8.4 mg/dL — ABNORMAL LOW (ref 8.9–10.3)
Chloride: 99 mmol/L — ABNORMAL LOW (ref 101–111)
Creatinine, Ser: 3.64 mg/dL — ABNORMAL HIGH (ref 0.44–1.00)
GFR calc Af Amer: 13 mL/min — ABNORMAL LOW (ref >60)
GFR calc non Af Amer: 11 mL/min — ABNORMAL LOW (ref >60)
Glucose, Bld: 194 mg/dL — ABNORMAL HIGH (ref 65–99)
Potassium: 3.2 mmol/L — ABNORMAL LOW (ref 3.5–5.1)
Sodium: 134 mmol/L — ABNORMAL LOW (ref 135–145)

## 2017-06-08 LAB — GLUCOSE, CAPILLARY
Glucose-Capillary: 170 mg/dL — ABNORMAL HIGH (ref 65–99)
Glucose-Capillary: 314 mg/dL — ABNORMAL HIGH (ref 65–99)
Glucose-Capillary: 247 mg/dL — ABNORMAL HIGH (ref 65–99)
Glucose-Capillary: 99 mg/dL (ref 65–99)

## 2017-06-08 LAB — PROTIME-INR
INR: 1.42
Prothrombin Time: 17.2 seconds — ABNORMAL HIGH (ref 11.4–15.2)

## 2017-06-08 LAB — HEPARIN LEVEL (UNFRACTIONATED): Heparin Unfractionated: 0.37 IU/mL (ref 0.30–0.70)

## 2017-06-08 MED ORDER — PRO-STAT SUGAR FREE PO LIQD
30.0000 mL | Freq: Three times a day (TID) | ORAL | Status: DC
  Administered 2017-06-08 – 2017-06-09 (×3): 30 mL via ORAL
  Filled 2017-06-08 (×3): qty 30

## 2017-06-08 MED ORDER — ENOXAPARIN SODIUM 100 MG/ML ~~LOC~~ SOLN
100.0000 mg | SUBCUTANEOUS | Status: DC
  Administered 2017-06-08 – 2017-06-09 (×2): 100 mg via SUBCUTANEOUS
  Filled 2017-06-08 (×2): qty 1

## 2017-06-08 MED ORDER — POTASSIUM CHLORIDE CRYS ER 20 MEQ PO TBCR
30.0000 meq | EXTENDED_RELEASE_TABLET | Freq: Once | ORAL | Status: AC
  Administered 2017-06-08: 30 meq via ORAL
  Filled 2017-06-08: qty 1

## 2017-06-08 MED ORDER — WARFARIN SODIUM 3 MG PO TABS
3.0000 mg | ORAL_TABLET | Freq: Once | ORAL | Status: AC
  Administered 2017-06-08: 3 mg via ORAL
  Filled 2017-06-08 (×2): qty 1

## 2017-06-08 MED ORDER — POTASSIUM CHLORIDE CRYS ER 20 MEQ PO TBCR
40.0000 meq | EXTENDED_RELEASE_TABLET | Freq: Once | ORAL | Status: AC
Start: 2017-06-08 — End: 2017-06-08
  Administered 2017-06-08: 40 meq via ORAL
  Filled 2017-06-08: qty 2

## 2017-06-08 NOTE — Progress Notes (Signed)
Physical Therapy Treatment Patient Details Name: Ashlee Miles MRN: [id number] DOB: 16-Mar-1941 Today's Date: 06/08/2017    History of Present Illness Ashlee Miles is a 76 y.o. 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 Pt is s/p THROMBECTOMY AND REVISION OF ARTERIOVENTOUS (AV) GORETEX  GRAFT LEFT THIGH (Left) as a surgical intervention as well as evacuation of hematoma with VAC placement.     PT Comments    Pt admitted with above diagnosis. Pt currently with functional limitations due to balance and endurance deficits. Pt was able to ambulate further and requiring less assist overall today.  MD came in and talked to pt about d/c.  Pt is adamant that daughter and son in law can care for her at d/c.  Changed d/c plan to HHPT f/u and recommend [facility]HR[facility] and [place] as well.  Also will need 24 hour care.  Pt will benefit from skilled PT to increase their independence and safety with mobility to allow discharge to the venue listed below.     Follow Up Recommendations  Supervision/Assistance - 24 hour;Home health PT     Equipment Recommendations  None recommended by PT    Recommendations for Other Services       Precautions / Restrictions Precautions Precautions: Fall Precaution Comments: wound vac LLE Restrictions Weight Bearing Restrictions: No    Mobility  Bed Mobility Overal bed mobility: Needs Assistance Bed Mobility: Supine to Sit     Supine to sit: Mod assist;+2 for physical assistance;Min assist(heavy mod)     General bed mobility comments: pt pulled up on PT with pt doing 70 % of work.  Pt had difficulty scooting to EOB and getting out of "hole" in bed.  Once she got to a certain point, she was able to come to EOB with min guard  asssist.   Transfers Overall transfer level: Needs assistance Equipment used: Rolling walker (2 wheeled) Transfers: Sit to/from [facility] Sit to Stand: +2 safety/equipment;Min guard         General transfer comment: Pt able to power up on her own with guard assist for safety.   Ambulation/Gait Ambulation/Gait assistance: Min assist;+2 safety/equipment;Min guard Ambulation Distance (Feet): 70 Feet(45 feet and then 25 feet) Assistive device: Rolling walker (2 wheeled) Gait Pattern/deviations: Step-through pattern;Decreased stride length;Antalgic;Trunk flexed;Wide base of support   Gait velocity interpretation: Below normal speed for age/gender General Gait Details: Pt progressed ambulation today with RW.  Taking steps with cues and supporting self on RW.    Stairs            Wheelchair Mobility    Modified Rankin (Stroke Patients Only)       Balance Overall balance assessment: Needs assistance Sitting-balance support: Feet supported;No upper extremity supported Sitting balance-[name]: Good Sitting balance - Comments: sitting EOB with no back support   Standing balance support: Bilateral upper extremity supported Standing balance-[name]: Poor Standing balance comment: can stand statically with RW without external support.                            Cognition Arousal/Alertness: Awake/alert Behavior During Therapy: WFL for tasks assessed/performed Overall Cognitive Status: Within Functional Limits for tasks assessed  Exercises General Exercises - Lower Extremity Ankle Circles/Pumps: AROM;Both;5 reps;Seated Long Arc Quad: AROM;Both;5 reps;Seated Heel Slides: AROM;Both;5 reps;Supine    General Comments General comments (skin integrity, edema, etc.): Vac in place left thigh      Pertinent Vitals/Pain Pain Assessment: Faces Faces Pain Scale: Hurts little more Pain  Location: L Hip and LE  Pain Descriptors / Indicators: Discomfort;Grimacing;Sore;Tender Pain Intervention(s): Limited activity within patient's tolerance;Monitored during session;Premedicated before session;Repositioned  VSS with pt sats >90% on RA with activity and at rest. Left O2 off per nurse request.   Home Living                      Prior Function            PT Goals (current goals can now be found in the care plan section) Acute Rehab PT Goals Patient Stated Goal: to get better Progress towards PT goals: Progressing toward goals    Frequency    Min 3X/week      PT Plan Discharge plan needs to be updated    Co-evaluation              AM-PAC PT "6 Clicks" Daily Activity  Outcome Measure  Difficulty turning over in bed (including adjusting bedclothes, sheets and blankets)?: Unable Difficulty moving from lying on back to sitting on the side of the bed? : A Lot Difficulty sitting down on and standing up from a chair with arms (e.g., wheelchair, bedside commode, etc,.)?: A Little Help needed moving to and from a bed to chair (including a wheelchair)?: A Little Help needed walking in hospital room?: A Little Help needed climbing 3-5 steps with a railing? : Total 6 Click Score: 13    End of Session Equipment Utilized During Treatment: Gait belt;Oxygen Activity Tolerance: Patient limited by fatigue;Patient limited by pain Patient left: in chair;with call bell/phone within reach;with chair alarm set Nurse Communication: Mobility status PT Visit Diagnosis: Unsteadiness on feet (R26.81);Muscle weakness (generalized) (M62.81);Pain Pain - Right/Left: Left Pain - part of body: Leg     Time: 3838-1840 PT Time Calculation (min) (ACUTE ONLY): 33 min  Charges:  $Gait Training: 23-37 mins                    G Codes:       Yasmina Chico,PT Acute Rehabilitation [phone] (pager)    Denice Paradise 06/08/2017, 10:23 AM

## 2017-06-08 NOTE — Progress Notes (Signed)
Received report from 6M for patient. Will call daughter to inform her of move, per patient request.

## 2017-06-08 NOTE — Progress Notes (Signed)
ANTICOAGULATION CONSULT NOTE - Follow-Up  Pharmacy Consult for Lovenox + Warfarin Indication: hx DVT, protein C deficiency  No Known Allergies  Patient Measurements: Height: 5' 0.98" (154.9 cm) Weight: 226 lb 13.7 oz (102.9 kg) IBW/kg (Calculated) : 47.76  Heparin wt: 73 kg  Assessment: 76 y/o female on Coumadin 5mg  daily PTA for hx protein C def/DVT. Admit INR 4 on 12/12 with Hgb 7. Noted to have massive L-thigh hematoma of her L-fem AVGG. INR reversed with Vit K 10 mg IV x 1 and given FFP x 2 + PRBC x 9. Hematoma evacuated 12/14 - VVS inspected the wound and there was no active bleeding. Coumadin restarted 12/16, and started heparin bridge 12/17. INR up to 1.42 today. Hgb 9.1, plts wnl.   Goal of Therapy:  INR 2-3 Heparin level 0.3-0.5 units/ml Monitor platelets by anticoagulation protocol: Yes   Plan:  Stop heparin gtt Start Lovenox 100mg  [place] Q24h one hour after heparin stopped Give Coumadin 3mg  PO x 1 Monitor daily INR, CBC, s/s of bleed   Elenor Quinones, PharmD, BCPS Clinical Pharmacist Pager [phone] 06/08/2017 11:08 AM

## 2017-06-08 NOTE — Progress Notes (Signed)
ANTICOAGULATION CONSULT NOTE - Follow-Up  Pharmacy Consult for Heparin + Warfarin Indication: hx DVT, protein C deficiency  No Known Allergies  Patient Measurements: Height: 5' 0.98" (154.9 cm) Weight: 226 lb 13.7 oz (102.9 kg) IBW/kg (Calculated) : 47.76  Heparin wt: 73 kg  Assessment: 76 y/o female on Coumadin 5mg  daily PTA for hx protein C def/DVT. Admit INR 4 on 12/12 with Hgb 7. Noted to have massive L-thigh hematoma of her L-fem AVGG. INR reversed with Vit K 10 mg IV x 1 and given FFP x 2 + PRBC x 9. Hematoma evacuated 12/14 - VVS inspected the wound and there was no active bleeding. Coumadin restarted 12/16, and started heparin bridge 12/17. INR 1.42 today.  Heparin level therapeutic x 1 this AM after rate increase  Goal of Therapy:  INR 2-3 Heparin level 0.3-0.5 units/ml Monitor platelets by anticoagulation protocol: Yes   Plan:  Cont heparin at 1100 units/hr 1100 Heparin level   Narda Bonds, PharmD, BCPS Clinical Pharmacist Phone: [phone]

## 2017-06-08 NOTE — Progress Notes (Signed)
Admit: 05/31/2017 LOS: 8  28F ESRD MWF with L thigh hematoma at site of AVG, s/p evacuation and with wound vac  Subjective:  HD yesterday, post weight 100kg,  2L UF, used AVG Back on warfarin (hx/o DVT, protein C deficiency) No c/o Was living at home with daughter prior to admission   12/19 0701 - 12/20 0700 In: 916.8 [P.O.:540; I.V.:376.8] Out: 2350 [Urine:250; Drains:100]  Filed Weights   06/07/17 1507 06/07/17 1923 06/08/17 0129  Weight: 102 kg (224 lb 13.9 oz) 100 kg (220 lb 7.4 oz) 102.9 kg (226 lb 13.7 oz)    Scheduled Meds: . carvedilol  6.25 mg Oral BID WC  . darbepoetin (ARANESP) injection - DIALYSIS  100 mcg Intravenous Q Mon-HD  . docusate sodium  100 mg Oral BID  . doxercalciferol  4 mcg Intravenous Q M,W,F-HD  . gabapentin  300 mg Oral QHS  . insulin aspart  0-5 Units Subcutaneous QHS  . insulin aspart  0-9 Units Subcutaneous TID WC  . insulin glargine  3 Units Subcutaneous QHS  . mouth rinse  15 mL Mouth Rinse BID  . midodrine  10 mg Oral Q M,W,F-HD  . multivitamin  1 tablet Oral QHS  . pantoprazole  40 mg Oral Daily  . PARoxetine  10 mg Oral Daily  . polyethylene glycol  17 g Oral Daily  . senna-docusate  1 tablet Oral QHS  . simvastatin  40 mg Oral q1800  . sucralfate  1 g Oral TID WC & HS  . Warfarin - Pharmacist Dosing Inpatient   Does not apply q1800   Continuous Infusions: . sodium chloride    . heparin 1,100 Units/hr (06/08/17 1572)  . sodium chloride 0.9 % 50 mL with thiamine (B-1) 250 mg infusion 100 mL/hr at 06/07/17 2231   PRN Meds:.acetaminophen, camphor-menthol, feeding supplement (NEPRO CARB STEADY), guaiFENesin, hydrALAZINE, hydrOXYzine, linaclotide, methocarbamol, ondansetron **OR** ondansetron (ZOFRAN) IV, oxyCODONE-acetaminophen, phenol, sodium chloride flush, triamcinolone cream  Current Labs: reviewed    Physical Exam:  Blood pressure 126/60, pulse (!) 57, temperature 97.6 F (36.4 C), temperature source Oral, resp. rate 13, height  5' 0.98" (1.549 m), weight 102.9 kg (226 lb 13.7 oz), SpO2 98 %. RRR, no rub CTAB Nonfocal S/nt/nd Wound Vac in place on L thigh  Dialysis Orders: [facility] MWF 4 hr 3 K 2.25 EDW 98.5 400/800 heparin -3000 hectorol 4 Fe throguh 12/14 Mircera 150 due - last got 100 on 11/2 left thigh AVGG Recent labs: hgb 8.1 12/5 - prior to that 10.3 11/28 - drop in part due to delay in resuming ESA 24% sat 11/28 iPTH 717 Ca/P ok last INR 3.5    A 1. ESRD MWF AVG [place] 2. L thigh hematoma s/p evacuation and wound vac, using AVG lateral imb 3. ABLA with anemia of ESRD; Aranesp 100 qMon 4. Periodic AMS 5. Protein C deficiency, hx/o DVT, on chronic warfarin anticoagulation  P 1. Cont HD MWF while here: Hold bolus heparin, 3K 2.25Ca, push to EDW, Qb 400 16g needles using AVG   Pearson Grippe MD 06/08/2017, 9:32 AM  Recent Labs  Lab 06/04/17 1123  06/06/17 0743 06/07/17 0420 06/08/17 0200  NA 135   < > 134* 134* 134*  K 3.9   < > 3.7 3.8 3.2*  CL 101   < > 99* 98* 99*  CO2 26   < > 27 26 29   GLUCOSE 140*   < > 203* 182* 194*  BUN 31*   < > 30* 40*  21*  CREATININE 3.82*   < > 4.21* 5.01* 3.64*  CALCIUM 8.5*   < > 8.4* 8.8* 8.4*  PHOS 3.2  --  2.6  --   --    < > = values in this interval not displayed.   Recent Labs  Lab 06/06/17 0743 06/07/17 0420 06/08/17 0200  WBC 6.2 5.7 5.4  NEUTROABS 3.6  --   --   HGB 10.0* 9.6* 9.1*  HCT 31.3* 30.5* 28.7*  MCV 86.7 85.9 86.4  PLT 207 227 201

## 2017-06-08 NOTE — Progress Notes (Addendum)
Initial Nutrition Assessment  DOCUMENTATION CODES:   Morbid obesity  INTERVENTION:    Prostat liquid protein po 30 ml TID with meals, each supplement provides 100 kcal, 15 grams protein  NUTRITION DIAGNOSIS:   Increased nutrient needs related to wound healing, chronic illness as evidenced by estimated needs  GOAL:   Patient will meet greater than or equal to 90% of their needs  MONITOR:   PO intake, Supplement acceptance, Labs, Weight trends, Skin, I & O's  REASON FOR ASSESSMENT:   Malnutrition Screening Tool  ASSESSMENT:   76 yo Female with history of HTN, diabetes, ESRD on hemodialysis, DVT, protein C deficiency on Coumadin, pituitary adenoma, chronic diastolic congestive heart failure, GERD, depression presented with left groin pain and left leg swelling found to have left groin hematoma.  Pt s/p hematoma evacuation, VAC placement 12/14.  Reports her appetite is doing better. PO intake poor at 20-25% per flowsheet records. Doesn't like Nepro Carb Steady shakes or any other supplements.  Medications include Protonix, Rena-Vit and Coumadin. Labs reviewed. Na 134 (L). K 3.2 (L). CBG's [id number].   NUTRITION - FOCUSED PHYSICAL EXAM:    Most Recent Value  Orbital Region  No depletion  Upper Arm Region  No depletion  Thoracic and Lumbar Region  No depletion  Buccal Region  No depletion  Temple Region  No depletion  Clavicle Bone Region  No depletion  Clavicle and Acromion Bone Region  No depletion  Scapular Bone Region  No depletion  Dorsal Hand  No depletion  Patellar Region  No depletion  Anterior Thigh Region  No depletion  Posterior Calf Region  No depletion  Edema (RD Assessment)  None     Diet Order:  Diet renal with fluid restriction Fluid restriction: 1200 mL Fluid; Room service appropriate? Yes; Fluid consistency: Thin  EDUCATION NEEDS:   No education needs have been identified at this time  Skin:  Skin Assessment: Skin Integrity Issues: Skin  Integrity Issues:: Stage II, Wound VAC Stage II: buttocks Wound Vac: L thigh   Last BM:  N/A  Height:   Ht Readings from Last 1 Encounters:  06/02/17 5' 0.98" (1.549 m)   Weight:   Wt Readings from Last 1 Encounters:  06/08/17 226 lb 13.7 oz (102.9 kg)   Ideal Body Weight:  45.4 kg  BMI:  Body mass index is 42.89 kg/m.  Estimated Nutritional Needs:   Kcal:  1800-2000  Protein:  105-120 gm  Fluid:  1200 ml  Arthur Holms, RD, LDN Pager #: [phone] After-Hours Pager #: [phone]

## 2017-06-08 NOTE — Progress Notes (Signed)
PROGRESS NOTE    Ashlee Miles  HCW:[id number] DOB: 08-Jun-1941 DOA: 05/31/2017 PCP: Darlina Rumpf, MD     Brief Narrative:  Felcia Miles is a 76 year old female with history of hypertension, diabetes, ESRD on hemodialysis, DVT, protein C deficiency on Coumadin, pituitary adenoma, chronic diastolic congestive heart failure, GERD, depression presented with left groin pain and left leg swelling found to have left groin hematoma.  Patient underwent hematoma evacuation 12/14. Patient has had episodes of altered mental status.   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 Will need wound vac changes MWF   Acute encephalopathy, ?delirium  -Required narcan 12/17 due to confusion.  -Episode of mental status change again morning of 12/18, code stroke called. CT head negative for acute intracranial abnormality. EEG 12/18 showed mild diffuse slowing without epileptiform discharges  -Neurology ordered IV thiamine x 3 doses, then PO  -Resolved   Fever  -101.5 F 12/18, without sepsis. No leukocytosis or lactic acidosis. UA negative for UTI. Left thigh wound vac and thigh does not appear to be source of infection. Afebrile over last 48 hours now.   History of DVT and protein C deficiency -Coumadin and lovenox. Monitor INR, CBC   Anemia due to chronic kidney disease, acute blood loss  from hematoma -Stable   Hypertension -Continue Coreg  ESRD on hemodialysis -Nephrology following   Chronic diastolic congestive heart failure -Stable   Diabetes with ESRD -Continue lantus, novolog SSI   GERD -Continue Protonix  Anxiety depression -Continue paxil   Constipation -Complained of abdominal pain, no bowel movements charted since admission.  Abdominal x-ray with large stool burden.  Bowel regimen ordered. Had BM yesterday   Stage II Right Buttock, POA  -Local wound care    DVT prophylaxis: Coumadin, lovenox Code Status: DNR Family Communication: Updated daughter over phone 12/20 Disposition Plan: SNF recommended, pt and daughter refused. Hopefully discharge home with home health care PT OT RN aide tomorrow 12/21 once she completes dialysis and completes IV thiamine (last IV dose tomorrow). Will need lovenox/coumadin to bridge as well as wound vac equipment.    Consultants:   Nephrology  Vascular surgery  Neurology  Procedures:   Left thigh hematoma evacuation 12/14 with Dr. Trula Slade   Antimicrobials:  Anti-infectives (From admission, onward)   Start     Dose/Rate Route Frequency Ordered Stop   06/02/17 1728  vancomycin (VANCOCIN) powder  Status:  Discontinued       As needed 06/02/17 1729 06/02/17 1813   06/02/17 1615  ceFAZolin (ANCEF) IVPB 2g/100 mL premix  Status:  Discontinued     2 g 200 mL/hr over 30 Minutes Intravenous  Once 06/02/17 1608 06/02/17 2030   06/01/17 2200  ceFAZolin (ANCEF) IVPB 1 g/50 mL premix  Status:  Discontinued     1 g 100 mL/hr over 30 Minutes Intravenous Every 24 hours 05/31/17 2100  06/04/17 1204   06/01/17 1100  ceFAZolin (ANCEF) IVPB 1 g/50 mL premix  Status:  Discontinued    Comments:  Send with pt to OR   1 g 100 mL/hr over 30 Minutes Intravenous To [facility] 05/31/17 1714 06/01/17 1149   05/31/17 2130  ceFAZolin (ANCEF) IVPB 2g/100 mL premix     2 g 200 mL/hr over 30 Minutes Intravenous  Once  05/31/17 2055 05/31/17 2330      Subjective: Working with PT. No new complaints.   Objective: Vitals:   06/08/17 0129 06/08/17 0300 06/08/17 0400 06/08/17 0821  BP:  (!) 110/40 (!) 113/44 126/60  Pulse:  (!) 57 (!) 57   Resp:  12 13   Temp:  98.3 F (36.8 C)  97.6 F (36.4 C)  TempSrc:  Oral  Oral  SpO2:  98% 98%   Weight: 102.9 kg (226 lb 13.7 oz)     Height:        Intake/Output Summary (Last 24 hours) at 06/08/2017 1113 Last data filed at 06/08/2017 4097 Gross per 24 hour  Intake 791.83 ml  Output 2350 ml  Net -1558.17 ml   Filed Weights   06/07/17 1507 06/07/17 1923 06/08/17 0129  Weight: 102 kg (224 lb 13.9 oz) 100 kg (220 lb 7.4 oz) 102.9 kg (226 lb 13.7 oz)    Examination:  General exam: Appears calm and comfortable  Respiratory system: Clear to auscultation. Respiratory effort normal. Cardiovascular system: S1 & S2 heard, RRR. No JVD, murmurs, rubs, gallops or clicks. No pedal edema. Gastrointestinal system: Abdomen is nondistended, soft and TTP umbilical. No organomegaly or masses felt. Normal bowel sounds heard. Central nervous system: Alert and oriented to self and place, no focal neuro deficits Extremities: Symmetric,+left thigh wound vac  Psychiatry: Judgement and insight appear normal  Data Reviewed: I have personally reviewed following labs and imaging studies  CBC: Recent Labs  Lab 06/04/17 0404 06/05/17 0400 06/06/17 0743 06/07/17 0420 06/08/17 0200  WBC 8.3 6.7 6.2 5.7 5.4  NEUTROABS  --   --  3.6  --   --   HGB 10.3* 10.2* 10.0* 9.6* 9.1*  HCT 31.5* 31.3* 31.3* 30.5* 28.7*  MCV 83.3 85.3 86.7 85.9 86.4  PLT 202 206 207 227 353   Basic Metabolic Panel: Recent Labs  Lab 06/04/17 1123 06/05/17 0400 06/06/17 0743 06/07/17 0420 06/08/17 0200  NA 135 136 134* 134* 134*  K 3.9 4.2 3.7 3.8 3.2*  CL 101 99* 99* 98* 99*  CO2 26 27 27 26 29   GLUCOSE 140* 191* 203* 182* 194*  BUN 31* 37* 30* 40* 21*  CREATININE 3.82* 4.69* 4.21* 5.01*  3.64*  CALCIUM 8.5* 8.9 8.4* 8.8* 8.4*  MG  --   --  1.6* 2.1  --   PHOS 3.2  --  2.6  --   --    GFR: Estimated Creatinine Clearance: 14.5 mL/min (A) (by C-G formula based on SCr of 3.64 mg/dL (H)). Liver Function Tests: Recent Labs  Lab 06/04/17 1123 06/06/17 0743  AST  --  18  ALT  --  <5*  ALKPHOS  --  69  BILITOT  --  0.4  PROT  --  6.3*  ALBUMIN 2.4* 2.5*   No results for input(s): LIPASE, AMYLASE in the last 168 hours. Recent Labs  Lab 06/06/17 1950  AMMONIA 19   Coagulation Profile: Recent Labs  Lab 06/04/17 1123 06/05/17 0400 06/06/17 0432 06/07/17 0420 06/08/17 0200  INR 1.30 1.19 1.20 1.29  1.42   Cardiac Enzymes: No results for input(s): CKTOTAL, CKMB, CKMBINDEX, TROPONINI in the last 168 hours. BNP (last 3 results) No results for input(s): PROBNP in the last 8760 hours. HbA1C: No results for input(s): HGBA1C in the last 72 hours. CBG: Recent Labs  Lab 06/06/17 2056 06/07/17 0749 06/07/17 1137 06/07/17 2153 06/08/17 0819  GLUCAP 220* 159* 138* 177* 170*   Lipid Profile: No results for input(s): CHOL, HDL, LDLCALC, TRIG, CHOLHDL, LDLDIRECT in the last 72 hours. Thyroid Function Tests: No results for input(s): TSH, T4TOTAL, FREET4, T3FREE, THYROIDAB in the last 72 hours. Anemia Panel: No results for input(s): VITAMINB12, FOLATE, FERRITIN, TIBC, IRON, RETICCTPCT in the last 72 hours. Sepsis Labs: Recent Labs  Lab 06/06/17 8527  LATICACIDVEN 0.7    Recent Results (from the past 240 hour(s))  Culture, blood (Routine X 2) w Reflex to ID Panel     Status: None   Collection Time: 05/31/17 10:23 PM  Result Value Ref Range Status   Specimen Description BLOOD RIGHT ARM  Final   Special Requests IN PEDIATRIC BOTTLE Blood Culture adequate volume  Final   Culture NO GROWTH 5 DAYS  Final   Report Status 06/06/2017 FINAL  Final  Culture, blood (Routine X 2) w Reflex to ID Panel     Status: None   Collection Time: 05/31/17 10:45 PM  Result Value Ref  Range Status   Specimen Description BLOOD RIGHT HAND  Final   Special Requests IN PEDIATRIC BOTTLE Blood Culture adequate volume  Final   Culture NO GROWTH 5 DAYS  Final   Report Status 06/06/2017 FINAL  Final  MRSA PCR Screening     Status: None   Collection Time: 06/01/17  7:14 PM  Result Value Ref Range Status   MRSA by PCR NEGATIVE NEGATIVE Final    Comment:        The GeneXpert MRSA Assay (FDA approved for NASAL specimens only), is one component of a comprehensive MRSA colonization surveillance program. It is not intended to diagnose MRSA infection nor to guide or monitor treatment for MRSA infections.        Radiology Studies: Dg Abd 1 View  Result Date: 06/07/2017 CLINICAL DATA:  Abdominal pain and nausea. EXAM: ABDOMEN - 1 VIEW COMPARISON:  None. FINDINGS: The bowel gas pattern is nonobstructive. There is a large volume of stool throughout the colon. IVC filter is noted. No abnormal abdominal calcification or focal bony abnormality. IMPRESSION: No acute finding. Large stool burden throughout the colon. Electronically Signed   By: Inge Rise M.D.   On: 06/07/2017 09:54      Scheduled Meds: . carvedilol  6.25 mg Oral BID WC  . darbepoetin (ARANESP) injection - DIALYSIS  100 mcg Intravenous Q Mon-HD  . docusate sodium  100 mg Oral BID  . doxercalciferol  4 mcg Intravenous Q M,W,F-HD  . enoxaparin (LOVENOX) injection  100 mg Subcutaneous Q24H  . gabapentin  300 mg Oral QHS  . insulin aspart  0-5 Units Subcutaneous QHS  . insulin aspart  0-9 Units Subcutaneous TID WC  . insulin glargine  3 Units Subcutaneous QHS  . mouth rinse  15 mL Mouth Rinse BID  . midodrine  10 mg Oral Q M,W,F-HD  . multivitamin  1 tablet Oral QHS  . pantoprazole  40 mg Oral Daily  . PARoxetine  10 mg Oral Daily  . polyethylene glycol  17 g Oral Daily  . senna-docusate  1 tablet Oral QHS  . simvastatin  40  mg Oral q1800  . sucralfate  1 g Oral TID WC & HS  . warfarin  3 mg Oral  ONCE-1800  . Warfarin - Pharmacist Dosing Inpatient   Does not apply q1800   Continuous Infusions: . sodium chloride    . sodium chloride 0.9 % 50 mL with thiamine (B-1) 250 mg infusion 100 mL/hr at 06/07/17 2231     LOS: 8 days    Time spent: 30 minutes   Dessa Phi, DO [facility] [email or web address] Password [email or web address] 06/08/2017, 11:13 AM

## 2017-06-08 NOTE — Plan of Care (Signed)
Discussed with pt the need for wound vac and infection prevention.

## 2017-06-08 NOTE — Progress Notes (Signed)
  Progress Note    06/08/2017 9:02 AM 6 Days Post-Op  Subjective:  Somnolent during exam   Vitals:   06/08/17 0400 06/08/17 0821  BP: (!) 113/44 126/60  Pulse: (!) 57   Resp: 13   Temp:  97.6 F (36.4 C)  SpO2: 98%    Physical Exam: Cardiac:  RRR Lungs:  Non labored breathing Incisions:  L thigh with wound vac in place; 300cc sanguinous drainage in cannister; some erythema distal to vac Extremities:  Palpable thrill and audible bruit through thigh AV graft; palpable R>L DP Abdomen:  Soft  CBC    Component Value Date/Time   WBC 5.4 06/08/2017 0200   RBC 3.32 (L) 06/08/2017 0200   HGB 9.1 (L) 06/08/2017 0200   HCT 28.7 (L) 06/08/2017 0200   PLT 201 06/08/2017 0200   MCV 86.4 06/08/2017 0200   MCH 27.4 06/08/2017 0200   MCHC 31.7 06/08/2017 0200   RDW 16.1 (H) 06/08/2017 0200   LYMPHSABS 1.6 06/06/2017 0743   MONOABS 0.8 06/06/2017 0743   EOSABS 0.3 06/06/2017 0743   BASOSABS 0.0 06/06/2017 0743    BMET    Component Value Date/Time   NA 134 (L) 06/08/2017 0200   K 3.2 (L) 06/08/2017 0200   CL 99 (L) 06/08/2017 0200   CO2 29 06/08/2017 0200   GLUCOSE 194 (H) 06/08/2017 0200   BUN 21 (H) 06/08/2017 0200   CREATININE 3.64 (H) 06/08/2017 0200   CALCIUM 8.4 (L) 06/08/2017 0200   CALCIUM 7.8 (L) 03/13/2017 1559   GFRNONAA 11 (L) 06/08/2017 0200   GFRAA 13 (L) 06/08/2017 0200    INR    Component Value Date/Time   INR 1.42 06/08/2017 0200     Intake/Output Summary (Last 24 hours) at 06/08/2017 0902 Last data filed at 06/08/2017 8616 Gross per 24 hour  Intake 816.83 ml  Output 2350 ml  Net -1533.17 ml     Assessment/Plan:  76 y.o. female is s/p evacuation of left thigh hematoma and placement of wound vac 6 Days Post-Op   Palpable pedal pulses Wound vac left in place; changing on MWF schedule Monitor erythema on thigh distal to wound site Pharmacy titrating coumadin; IV heparin bridge   Dagoberto Ligas, PA-C [facility] [phone] 06/08/2017 9:02 AM

## 2017-06-09 LAB — BASIC METABOLIC PANEL
Anion gap: 6 (ref 5–15)
BUN: 31 mg/dL — ABNORMAL HIGH (ref 6–20)
CO2: 27 mmol/L (ref 22–32)
Calcium: 9.2 mg/dL (ref 8.9–10.3)
Chloride: 102 mmol/L (ref 101–111)
Creatinine, Ser: 5.21 mg/dL — ABNORMAL HIGH (ref 0.44–1.00)
GFR calc Af Amer: 8 mL/min — ABNORMAL LOW (ref >60)
GFR calc non Af Amer: 7 mL/min — ABNORMAL LOW (ref >60)
Glucose, Bld: 109 mg/dL — ABNORMAL HIGH (ref 65–99)
Potassium: 4.1 mmol/L (ref 3.5–5.1)
Sodium: 135 mmol/L (ref 135–145)

## 2017-06-09 LAB — VITAMIN B1: Vitamin B1 (Thiamine): 135.2 nmol/L (ref 66.5–200.0)

## 2017-06-09 LAB — CBC
HCT: 29.5 % — ABNORMAL LOW (ref 36.0–46.0)
Hemoglobin: 9.5 g/dL — ABNORMAL LOW (ref 12.0–15.0)
MCH: 27.3 pg (ref 26.0–34.0)
MCHC: 32.2 g/dL (ref 30.0–36.0)
MCV: 84.8 fL (ref 78.0–100.0)
Platelets: 241 10*3/uL (ref 150–400)
RBC: 3.48 MIL/uL — ABNORMAL LOW (ref 3.87–5.11)
RDW: 15.9 % — ABNORMAL HIGH (ref 11.5–15.5)
WBC: 5.4 10*3/uL (ref 4.0–10.5)

## 2017-06-09 LAB — GLUCOSE, CAPILLARY
Glucose-Capillary: 102 mg/dL — ABNORMAL HIGH (ref 65–99)
Glucose-Capillary: 173 mg/dL — ABNORMAL HIGH (ref 65–99)

## 2017-06-09 LAB — PROTIME-INR
INR: 1.49
Prothrombin Time: 17.9 seconds — ABNORMAL HIGH (ref 11.4–15.2)

## 2017-06-09 MED ORDER — ENOXAPARIN SODIUM 100 MG/ML ~~LOC~~ SOLN: 100.0000 mg | SUBCUTANEOUS | 0 refills | Status: DC

## 2017-06-09 MED ORDER — HEPARIN SOD (PORK) LOCK FLUSH 100 UNIT/ML IV SOLN
500.0000 [IU] | INTRAVENOUS | Status: AC | PRN
  Administered 2017-06-09: 500 [IU]

## 2017-06-09 MED ORDER — GABAPENTIN 300 MG PO CAPS: 300.0000 mg | ORAL_CAPSULE | Freq: Every day | ORAL | 0 refills | Status: DC

## 2017-06-09 MED ORDER — INSULIN GLARGINE 100 UNIT/ML ~~LOC~~ SOLN: 3.0000 [IU] | Freq: Every day | SUBCUTANEOUS | 0 refills | Status: DC

## 2017-06-09 MED ORDER — DOXERCALCIFEROL 4 MCG/2ML IV SOLN
INTRAVENOUS | Status: AC
  Administered 2017-06-09: 4 ug via INTRAVENOUS
  Filled 2017-06-09: qty 2

## 2017-06-09 MED ORDER — VITAMIN B-1 100 MG PO TABS: 100.0000 mg | ORAL_TABLET | Freq: Every day | ORAL | 0 refills | Status: DC

## 2017-06-09 MED ORDER — WARFARIN SODIUM 5 MG PO TABS: ORAL_TABLET | ORAL | 0 refills | Status: DC

## 2017-06-09 MED ORDER — WARFARIN SODIUM 5 MG PO TABS
5.0000 mg | ORAL_TABLET | Freq: Once | ORAL | Status: AC
  Administered 2017-06-09: 5 mg via ORAL
  Filled 2017-06-09: qty 1

## 2017-06-09 MED ORDER — MIDODRINE HCL 5 MG PO TABS
ORAL_TABLET | ORAL | Status: AC
  Filled 2017-06-09: qty 2

## 2017-06-09 NOTE — Progress Notes (Signed)
  Progress Note    06/09/2017 11:39 AM 7 Days Post-Op  Subjective:  Pt seen on dialysis-says she still has some pain in her left thigh  afebrile  Vitals:   06/09/17 1115 06/09/17 1130  BP: (!) 76/43 (!) 96/48  Pulse: (!) 58 (!) 56  Resp:    Temp:    SpO2:      Physical Exam: Lungs:  Non labored Incisions:  Wound vac with good seal. Extremities:  Left thigh overall improved  I agree with the above.  I have seen and evaluated the patient CBC    Component Value Date/Time   WBC 5.4 06/09/2017 0425   RBC 3.48 (L) 06/09/2017 0425   HGB 9.5 (L) 06/09/2017 0425   HCT 29.5 (L) 06/09/2017 0425   PLT 241 06/09/2017 0425   MCV 84.8 06/09/2017 0425   MCH 27.3 06/09/2017 0425   MCHC 32.2 06/09/2017 0425   RDW 15.9 (H) 06/09/2017 0425   LYMPHSABS 1.6 06/06/2017 0743   MONOABS 0.8 06/06/2017 0743   EOSABS 0.3 06/06/2017 0743   BASOSABS 0.0 06/06/2017 0743    BMET    Component Value Date/Time   NA 135 06/09/2017 0425   K 4.1 06/09/2017 0425   CL 102 06/09/2017 0425   CO2 27 06/09/2017 0425   GLUCOSE 109 (H) 06/09/2017 0425   BUN 31 (H) 06/09/2017 0425   CREATININE 5.21 (H) 06/09/2017 0425   CALCIUM 9.2 06/09/2017 0425   CALCIUM 7.8 (L) 03/13/2017 1559   GFRNONAA 7 (L) 06/09/2017 0425   GFRAA 8 (L) 06/09/2017 0425    INR    Component Value Date/Time   INR 1.49 06/09/2017 0425     Intake/Output Summary (Last 24 hours) at 06/09/2017 1139 Last data filed at 06/09/2017 1000 Gross per 24 hour  Intake 0 ml  Output -  Net 0 ml     Assessment:  76 y.o. female is s/p:  evacuation of left thigh hematoma and placement of wound vac  7 Days Post-Op  Plan: -pt seen in dialysis.  Says she still has some pain, but left thigh improved.   -wound vac with good seal.  She dialyzes M/W/F and therefore will need her wound vac changes to T/T/S.    Leontine Locket, PA-C [facility] [phone] 06/09/2017 11:39 AM  I agree with the above.  I have  seen and evaluated the patient.  Her wound VAC changes will be modified to Tuesday Thursday Saturday so as not to conflict with her dialysis schedule.  I agree with the above assessment and plan  Annamarie Major

## 2017-06-09 NOTE — Discharge Instructions (Signed)

## 2017-06-09 NOTE — Progress Notes (Signed)
Wound Vac dressing changed to left thigh. Patient tolerated well. Moderate amount of sanguinous drainage noted. MD ordered to administer x3 doses of IV thiamine prior to patients discharge. VS stable will continue to monitor.

## 2017-06-09 NOTE — Progress Notes (Signed)
PT Cancellation Note  Patient Details Name: Ashlee Miles MRN: [id number] DOB: February 21, 1941   Cancelled Treatment:    Reason Eval/Treat Not Completed: Patient at procedure or test/unavailable;Other (comment).  Pt was in HD early then when she returned stated she wanted to wait until she gets home.  Will see next week if DC is held for some reason.   Ramond Dial 06/09/2017, 2:37 PM   Mee Hives, PT MS Acute Rehab Dept. Number: [place] [facility] [place]

## 2017-06-09 NOTE — Progress Notes (Signed)
ANTICOAGULATION CONSULT NOTE - Follow-Up  Pharmacy Consult for Lovenox + Warfarin Indication: hx DVT, protein C deficiency  No Known Allergies  Patient Measurements: Height: 5\' 2"  (157.5 cm) Weight: 224 lb 3.3 oz (101.7 kg) IBW/kg (Calculated) : 50.1  Heparin wt: 73 kg  Assessment: 76 y/o female on Coumadin 5mg  daily PTA for hx protein C def/DVT. Admit INR 4 on 12/12 with Hgb 7. Noted to have massive L-thigh hematoma of her L-fem AVGG. INR reversed with Vit K 10 mg IV x 1 and given FFP x 2 + PRBC x 9. Hematoma evacuated 12/14 - VVS inspected the wound and there was no active bleeding. Coumadin restarted 12/16, and started heparin bridge 12/17>> switched to Lovenox 100mg  sq q24h (1mg /kg q24h d/t ESRD) INR up to 1.49 today. Hgb 9.1>9.5, plts wnl>241k  More cautious dosing d/t recent L thigh hematoma. POD #7 still has some pain in her left thigh, VVS notes  Left thigh overall improved. No bleeding noted  Goal of Therapy:  INR 2-3 Heparin level 0.3-0.5 units/ml Monitor platelets by anticoagulation protocol: Yes   Plan:  Continue Lovenox 100mg  [place] Q24h one hour after heparin stopped Give Coumadin 5mg  PO x 1 Monitor daily INR, CBC, s/s of bleed -- If discharged today,  I recommend coumadin 5mg  every MWF and 2.5 mg every TTSS with early and close PT/INR monitoring as INR is not yet therapeutic and recent hematoma history.  Would need INR checked at least by Monday 12/24 if not able to check INR Saturday or Sunday.    Nicole Cella, RPh Clinical Pharmacist 8A-4P [phone] [id number] [phone] [place] [phone] 06/09/2017 12:00 PM

## 2017-06-09 NOTE — Discharge Summary (Signed)
Physician Discharge Summary  Ashlee Miles TJQ:[id number] DOB: 28-Nov-1940 DOA: 05/31/2017  PCP: Darlina Rumpf, MD  Admit date: 05/31/2017 Discharge date: 06/09/2017  Admitted From: Home Disposition:  Home (Patient and family refused SNF)   Recommendations for Outpatient Follow-up:  1. Follow up with PCP in 1 week 2. Follow up HD sessions  3. Lovenox/Coumadin bridge until INR therapeutic. Recommend coumadin 5mg  every MWF and 2.5 mg every T-Th-Sa-Sun with early and close PT/INR monitoring as INR is not yet therapeutic and recent hematoma history. Would need INR checked at least by Monday 12/24 if not able to check INR Saturday or Sunday.  4. Change wound vac T-Th-Sa 5. Follow up with Vascular surgery Dr. Trula Slade in 2 weeks, their office will call for appointment.   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 Patient underwent hematoma evacuation 12/14. Patient has had episodes of altered mental status. See below.   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 Will need wound vac changes TThSa. Home health ordered.   Acute encephalopathy, ?delirium  -Required narcan 12/17 due to confusion.  -Episode of mental status change again morning of 12/18, code stroke called. CT head negative for acute intracranial abnormality. EEG 12/18 showed mild diffuse slowing without epileptiform discharges  -Neurology ordered IV thiamine x 3 days, then PO daily -Resolved and back to baseline   Fever  -101.5 F 12/18, without sepsis. No leukocytosis or lactic acidosis. UA negative for UTI. Left thigh wound vac and thigh does not appear to be source of infection. Afebrile over last 72 hours prior to discharge.    History of DVT and protein C deficiency -Lovenox/Coumadin bridge until INR therapeutic. Recommend coumadin 5mg  every MWF and 2.5 mg every T-Th-Sa-Sun with early and close PT/INR monitoring as INR is not yet therapeutic and recent hematoma history. Would need INR checked at least by Monday 12/24 if not able to check INR Saturday or Sunday.   Anemia due to chronic kidney disease, acute blood loss from hematoma -Stable   Hypertension -Continue Coreg  ESRD on hemodialysis -Nephrology following   Chronic diastolic congestive heart failure -Stable   Diabetes with ESRD -Continue lantus, novolog SSI   GERD -Continue Protonix  Anxiety depression -Continue paxil   Constipation -Complained of abdominal pain, no bowel movements charted since admission.  Abdominal x-ray with large stool burden.  Bowel regimen ordered. Had BM.   Stage II Right Buttock, POA  -Local wound care     Discharge Instructions  Discharge Instructions  Call MD for:  difficulty breathing, headache or visual disturbances   Complete by:  As directed    Call MD for:  extreme fatigue   Complete by:  As directed    Call MD for:  hives   Complete by:  As  directed    Call MD for:  persistant dizziness or light-headedness   Complete by:  As directed    Call MD for:  persistant nausea and vomiting   Complete by:  As directed    Call MD for:  redness, tenderness, or signs of infection (pain, swelling, redness, odor or green/yellow discharge around incision site)   Complete by:  As directed    Call MD for:  severe uncontrolled pain   Complete by:  As directed    Call MD for:  temperature >100.4   Complete by:  As directed    Discharge instructions   Complete by:  As directed    You were cared for by a hospitalist during your hospital stay. If you have any questions about your discharge medications or the care you received while you were in the hospital after you are discharged, you can call the unit and asked to speak with the hospitalist on call if the hospitalist that took care of you is not available. Once you are discharged, your primary care physician will handle any further medical issues. Please note that NO REFILLS for any discharge medications will be authorized once you are discharged, as it is imperative that you return to your primary care physician (or establish a relationship with a primary care physician if you do not have one) for your aftercare needs so that they can reassess your need for medications and monitor your lab values.   Discharge instructions   Complete by:  As directed    Continue Lovenox AND Coumadin bridge until INR therapeutic. Recommend coumadin 5mg  every MWF and 2.5 mg every T-Th-Sa-Sun with INR monitoring. Would need INR checked at least by Monday 12/24 if not able to check INR Saturday or Sunday.   Increase activity slowly   Complete by:  As directed      Allergies as of 06/09/2017   No Known Allergies     Medication List    STOP taking these medications   cyclobenzaprine 5 MG tablet Commonly known as:  FLEXERIL   HYDROcodone-acetaminophen 5-325 MG tablet Commonly known as:  NORCO/VICODIN    omeprazole 20 MG capsule Commonly known as:  PRILOSEC   oxyCODONE 5 MG immediate release tablet Commonly known as:  Oxy IR/ROXICODONE     TAKE these medications   acetaminophen 325 MG tablet Commonly known as:  TYLENOL Take 650 mg by mouth every 6 (six) hours as needed for mild pain.   camphor-menthol lotion Commonly known as:  SARNA Apply 1 application topically every 8 (eight) hours as needed for itching.   carvedilol 6.25 MG tablet Commonly known as:  COREG Take 1 tablet (6.25 mg total) by mouth 2 (two) times daily.   docusate sodium 100 MG capsule Commonly known as:  COLACE Take 1 capsule (100 mg total) by mouth 2 (two) times daily.   enoxaparin 100 MG/ML injection Commonly known as:  LOVENOX Inject 1 mL (100 mg total) into the skin daily for 7 days. Start taking on:  06/10/2017   famotidine 20 MG tablet Commonly known as:  PEPCID Take 1 tablet (20 mg total) daily by mouth.   feeding supplement (NEPRO CARB STEADY) Liqd Take 237 mLs by mouth  3 (three) times daily as needed (Supplement).   feeding supplement (PRO-STAT SUGAR FREE 64) Liqd Take 30 mLs by mouth 2 (two) times daily.   gabapentin 300 MG capsule Commonly known as:  NEURONTIN Take 1 capsule (300 mg total) by mouth at bedtime. What changed:  when to take this   hydrOXYzine 25 MG tablet Commonly known as:  ATARAX/VISTARIL Take 1 tablet (25 mg total) by mouth every 8 (eight) hours as needed for itching.   insulin aspart 100 UNIT/ML injection Commonly known as:  novoLOG Inject 0-9 Units into the skin 3 (three) times daily with meals.   insulin glargine 100 UNIT/ML injection Commonly known as:  LANTUS Inject 0.03 mLs (3 Units total) into the skin at bedtime. What changed:  how much to take   ipratropium-albuterol 0.5-2.5 (3) MG/3ML Soln Commonly known as:  DUONEB Take 3 mLs by nebulization every 4 (four) hours as needed.   linaclotide 145 MCG Caps capsule Commonly known as:  LINZESS Take 145 mcg  by mouth daily as needed.   methocarbamol 500 MG tablet Commonly known as:  ROBAXIN Take 1 tablet (500 mg total) every 8 (eight) hours as needed by mouth for muscle spasms.   midodrine 10 MG tablet Commonly known as:  PROAMATINE Take 1 tablet (10 mg total) by mouth every Monday, Wednesday, and Friday with hemodialysis.   MUCINEX 600 MG 12 hr tablet Generic drug:  guaiFENesin Take 600 mg by mouth 2 (two) times daily as needed.   multivitamin Tabs tablet Take 1 tablet by mouth at bedtime.   pantoprazole 40 MG tablet Commonly known as:  PROTONIX Take 1 tablet (40 mg total) 2 (two) times daily before a meal by mouth.   PARoxetine 10 MG tablet Commonly known as:  PAXIL Take 1 tablet (10 mg total) daily by mouth.   polyethylene glycol packet Commonly known as:  MIRALAX / GLYCOLAX Take 17 g daily by mouth.   ranitidine 150 MG tablet Commonly known as:  ZANTAC Take 150 mg by mouth 2 (two) times daily.   simvastatin 40 MG tablet Commonly known as:  ZOCOR Take 40 mg by mouth daily at 6 PM.   sucralfate 1 GM/10ML suspension Commonly known as:  CARAFATE Take 10 mLs (1 g total) 4 (four) times daily -  with meals and at bedtime by mouth.   thiamine 100 MG tablet Commonly known as:  VITAMIN B-1 Take 1 tablet (100 mg total) by mouth daily.   triamcinolone cream 0.1 % Commonly known as:  KENALOG Apply 1 application topically every 12 (twelve) hours as needed.   warfarin 5 MG tablet Commonly known as:  COUMADIN 5mg  M/W/F and 2.5mg  Tu/Th/Sa/Su What changed:    how much to take  how to take this  when to take this  additional instructions      Follow-up Information    Serafina Mitchell, MD Follow up in 2 week(s).   Specialties:  Vascular Surgery, Cardiology Why:  office will call Contact information: [address] [place] [address] [phone]        Darlina Rumpf, MD. Schedule an appointment as soon as possible for a visit in 1 week(s).   Specialty:   Internal Medicine Contact information: [place] [address] [phone]          No Known Allergies  Consultations:  Nephrology  Vascular surgery  Neurology   Procedures/Studies: Dg Abd 1 View  Result Date: 06/07/2017 CLINICAL DATA:  Abdominal pain and nausea.  EXAM: ABDOMEN - 1 VIEW COMPARISON:  None. FINDINGS: The bowel gas pattern is nonobstructive. There is a large volume of stool throughout the colon. IVC filter is noted. No abnormal abdominal calcification or focal bony abnormality. IMPRESSION: No acute finding. Large stool burden throughout the colon. Electronically Signed   By: Inge Rise M.D.   On: 06/07/2017 09:54   Dg Chest Port 1 View  Result Date: 06/02/2017 CLINICAL DATA:  Central line placement. EXAM: PORTABLE CHEST 1 VIEW COMPARISON:  Radiograph 04/25/2017 FINDINGS: New right internal jugular central venous catheter tip in the mid SVC. Unchanged right chest port with tip in the mid SVC. No pneumothorax. Lower lung volumes from prior exam leading to bronchovascular crowding. No overt pulmonary edema. Unchanged cardiomegaly and mediastinal contours. Mild bibasilar atelectasis. No large pleural effusion. IMPRESSION: 1. New right internal jugular central venous catheter tip in the mid SVC. No pneumothorax. 2. Lower lung volumes with bronchovascular crowding and bibasilar atelectasis. Electronically Signed   By: Jeb Levering M.D.   On: 06/02/2017 20:02   Dg Ang/ext/uni/or Left  Result Date: 05/12/2017 CLINICAL DATA:  76 year old with a left thigh dialysis graft pseudoaneurysm and hematoma. EXAM: INTRAOPERATIVE ANGIOGRAPHY TECHNIQUE: Cholangiographic images from the C-arm fluoroscopic device were submitted for interpretation post-operatively. Please see the procedural report for the amount of contrast and the fluoroscopy time utilized. COMPARISON:  None. FINDINGS: Intraoperative images of a left thigh shuntogram. Initial images demonstrate  the pseudoaneurysm originating from the medial aspect of the graft. Subsequently, a covered stent was placed across the pseudoaneurysm. Final images demonstrate markedly decreased filling of the pseudoaneurysm at the end of the procedure. Outflow vein is patent. IMPRESSION: Treatment of the dialysis graft pseudoaneurysm with a covered stent. Electronically Signed   By: Markus Daft M.D.   On: 05/12/2017 08:13   Ct Head Code Stroke Wo Contrast  Result Date: 06/06/2017 CLINICAL DATA:  Altered mental status after dialysis. The initial code stroke designation was subsequently canceled by the neurologist. EXAM: CT HEAD WITHOUT CONTRAST TECHNIQUE: Contiguous axial images were obtained from the base of the skull through the vertex without intravenous contrast. COMPARISON:  Brain MRI 04/27/2017 FINDINGS: Brain: There is no evidence of acute infarct, intracranial hemorrhage, mass, midline shift, or extra-axial fluid collection. Mild cerebral atrophy is within normal limits for age. There is minimal chronic small vessel ischemic disease for age. Vascular: Calcified atherosclerosis at the skullbase. No hyperdense vessel. Skull: No fracture. Partially visualized sclerosis in the left maxilla, likely dental related. Sinuses/Orbits: Mild left maxillary sinus mucosal thickening. Small left mastoid effusion. Left cataract extraction. Other: None. IMPRESSION: No evidence of acute intracranial abnormality. Electronically Signed   By: Logan Bores M.D.   On: 06/06/2017 07:57    EEG 12/18 Impression: This predominantly drowsy and asleep EEG is abnormal due to mild diffuse slowing of the waking background with slowing of the posterior dominant rhythm.  Clinical Correlation of the above findings indicates diffuse cerebral dysfunction that is non-specific in etiology and can be seen with hypoxic/ischemic injury, toxic/metabolic encephalopathies, neurodegenerative disorders, medication effect, or due to excessive drowsiness.   The absence of epileptiform discharges does not rule out a clinical diagnosis of epilepsy.  Clinical correlation is advised.     Discharge Exam: Vitals:   06/09/17 1200 06/09/17 1305  BP: (!) 96/50 (!) 121/45  Pulse: 61 63  Resp:  18  Temp:  98 F (36.7 C)  SpO2:  100%   Vitals:   06/09/17 1130 06/09/17 1145 06/09/17 1200 06/09/17 1305  BP: (!) 96/48 (!) 73/36 (!) 96/50 (!) 121/45  Pulse: (!) 56 65 61 63  Resp:    18  Temp:    98 F (36.7 C)  TempSrc:    Oral  SpO2:    100%  Weight:      Height:        General: Pt is alert, awake, not in acute distress Cardiovascular: RRR, S1/S2 +, no rubs, no gallops Respiratory: CTA bilaterally, no wheezing, no rhonchi Abdominal: Soft, NT, ND, bowel sounds + Extremities: no edema, no cyanosis, +left thigh wound vac     The results of significant diagnostics from this hospitalization (including imaging, microbiology, ancillary and laboratory) are listed below for reference.     Microbiology: Recent Results (from the past 240 hour(s))  Culture, blood (Routine X 2) w Reflex to ID Panel     Status: None   Collection Time: 05/31/17 10:23 PM  Result Value Ref Range Status   Specimen Description BLOOD RIGHT ARM  Final   Special Requests IN PEDIATRIC BOTTLE Blood Culture adequate volume  Final   Culture NO GROWTH 5 DAYS  Final   Report Status 06/06/2017 FINAL  Final  Culture, blood (Routine X 2) w Reflex to ID Panel     Status: None   Collection Time: 05/31/17 10:45 PM  Result Value Ref Range Status   Specimen Description BLOOD RIGHT HAND  Final   Special Requests IN PEDIATRIC BOTTLE Blood Culture adequate volume  Final   Culture NO GROWTH 5 DAYS  Final   Report Status 06/06/2017 FINAL  Final  MRSA PCR Screening     Status: None   Collection Time: 06/01/17  7:14 PM  Result Value Ref Range Status   MRSA by PCR NEGATIVE NEGATIVE Final    Comment:        The GeneXpert MRSA Assay (FDA approved for NASAL specimens only), is one  component of a comprehensive MRSA colonization surveillance program. It is not intended to diagnose MRSA infection nor to guide or monitor treatment for MRSA infections.      Labs: BNP (last 3 results) Recent Labs    04/05/17 1947  BNP 811.9*   Basic Metabolic Panel: Recent Labs  Lab 06/04/17 1123 06/05/17 0400 06/06/17 0743 06/07/17 0420 06/08/17 0200 06/09/17 0425  NA 135 136 134* 134* 134* 135  K 3.9 4.2 3.7 3.8 3.2* 4.1  CL 101 99* 99* 98* 99* 102  CO2 26 27 27 26 29 27   GLUCOSE 140* 191* 203* 182* 194* 109*  BUN 31* 37* 30* 40* 21* 31*  CREATININE 3.82* 4.69* 4.21* 5.01* 3.64* 5.21*  CALCIUM 8.5* 8.9 8.4* 8.8* 8.4* 9.2  MG  --   --  1.6* 2.1  --   --   PHOS 3.2  --  2.6  --   --   --    Liver Function Tests: Recent Labs  Lab 06/04/17 1123 06/06/17 0743  AST  --  18  ALT  --  <5*  ALKPHOS  --  69  BILITOT  --  0.4  PROT  --  6.3*  ALBUMIN 2.4* 2.5*   No results for input(s): LIPASE, AMYLASE in the last 168 hours. Recent Labs  Lab 06/06/17 1950  AMMONIA 19   CBC: Recent Labs  Lab 06/05/17 0400 06/06/17 0743 06/07/17 0420 06/08/17 0200 06/09/17 0425  WBC 6.7 6.2 5.7 5.4 5.4  NEUTROABS  --  3.6  --   --   --   HGB 10.2*  10.0* 9.6* 9.1* 9.5*  HCT 31.3* 31.3* 30.5* 28.7* 29.5*  MCV 85.3 86.7 85.9 86.4 84.8  PLT 206 207 227 201 241   Cardiac Enzymes: No results for input(s): CKTOTAL, CKMB, CKMBINDEX, TROPONINI in the last 168 hours. BNP: Invalid input(s): POCBNP CBG: Recent Labs  Lab 06/08/17 0819 06/08/17 1157 06/08/17 1640 06/08/17 2153 06/09/17 0749  GLUCAP 170* 314* 247* 99 102*   D-Dimer No results for input(s): DDIMER in the last 72 hours. Hgb A1c No results for input(s): HGBA1C in the last 72 hours. Lipid Profile No results for input(s): CHOL, HDL, LDLCALC, TRIG, CHOLHDL, LDLDIRECT in the last 72 hours. Thyroid function studies No results for input(s): TSH, T4TOTAL, T3FREE, THYROIDAB in the last 72 hours.  Invalid  input(s): FREET3 Anemia work up No results for input(s): VITAMINB12, FOLATE, FERRITIN, TIBC, IRON, RETICCTPCT in the last 72 hours. Urinalysis    Component Value Date/Time   COLORURINE AMBER (A) 06/06/2017 1636   APPEARANCEUR CLOUDY (A) 06/06/2017 1636   LABSPEC 1.018 06/06/2017 1636   PHURINE 5.0 06/06/2017 1636   GLUCOSEU NEGATIVE 06/06/2017 1636   HGBUR SMALL (A) 06/06/2017 1636   BILIRUBINUR NEGATIVE 06/06/2017 1636   KETONESUR NEGATIVE 06/06/2017 1636   PROTEINUR 100 (A) 06/06/2017 1636   NITRITE NEGATIVE 06/06/2017 1636   LEUKOCYTESUR NEGATIVE 06/06/2017 1636   Sepsis Labs Invalid input(s): PROCALCITONIN,  WBC,  LACTICIDVEN Microbiology Recent Results (from the past 240 hour(s))  Culture, blood (Routine X 2) w Reflex to ID Panel     Status: None   Collection Time: 05/31/17 10:23 PM  Result Value Ref Range Status   Specimen Description BLOOD RIGHT ARM  Final   Special Requests IN PEDIATRIC BOTTLE Blood Culture adequate volume  Final   Culture NO GROWTH 5 DAYS  Final   Report Status 06/06/2017 FINAL  Final  Culture, blood (Routine X 2) w Reflex to ID Panel     Status: None   Collection Time: 05/31/17 10:45 PM  Result Value Ref Range Status   Specimen Description BLOOD RIGHT HAND  Final   Special Requests IN PEDIATRIC BOTTLE Blood Culture adequate volume  Final   Culture NO GROWTH 5 DAYS  Final   Report Status 06/06/2017 FINAL  Final  MRSA PCR Screening     Status: None   Collection Time: 06/01/17  7:14 PM  Result Value Ref Range Status   MRSA by PCR NEGATIVE NEGATIVE Final    Comment:        The GeneXpert MRSA Assay (FDA approved for NASAL specimens only), is one component of a comprehensive MRSA colonization surveillance program. It is not intended to diagnose MRSA infection nor to guide or monitor treatment for MRSA infections.      Time coordinating discharge: 40 minutes  SIGNED:  Dessa Phi, DO [facility] Pager [phone]  If 7PM-7AM,  please contact night-coverage [email or web address] Password [facility] 06/09/2017, 2:56 PM

## 2017-06-09 NOTE — Procedures (Signed)
I was present at this dialysis session. I have reviewed the session itself and made appropriate changes.   Tentative for DC home today.  Hb stable.  UF Goal of 4L, 3K Bath.   Using AVG w/o difficulty.    Next HD 12/23, if not an inpatient she will go at regular time to outpatient unit on Sunday 12/23.   Filed Weights   06/08/17 1821 06/09/17 0449 06/09/17 0813  Weight: 105 kg (231 lb 7.7 oz) 105.7 kg (233 lb 0.4 oz) 101.7 kg (224 lb 3.3 oz)    Recent Labs  Lab 06/06/17 0743  06/09/17 0425  NA 134*   < > 135  K 3.7   < > 4.1  CL 99*   < > 102  CO2 27   < > 27  GLUCOSE 203*   < > 109*  BUN 30*   < > 31*  CREATININE 4.21*   < > 5.21*  CALCIUM 8.4*   < > 9.2  PHOS 2.6  --   --    < > = values in this interval not displayed.    Recent Labs  Lab 06/06/17 0743 06/07/17 0420 06/08/17 0200 06/09/17 0425  WBC 6.2 5.7 5.4 5.4  NEUTROABS 3.6  --   --   --   HGB 10.0* 9.6* 9.1* 9.5*  HCT 31.3* 30.5* 28.7* 29.5*  MCV 86.7 85.9 86.4 84.8  PLT 207 227 201 241    Scheduled Meds: . carvedilol  6.25 mg Oral BID WC  . darbepoetin (ARANESP) injection - DIALYSIS  100 mcg Intravenous Q Mon-HD  . docusate sodium  100 mg Oral BID  . doxercalciferol  4 mcg Intravenous Q M,W,F-HD  . enoxaparin (LOVENOX) injection  100 mg Subcutaneous Q24H  . feeding supplement (PRO-STAT SUGAR FREE 64)  30 mL Oral TID WC  . gabapentin  300 mg Oral QHS  . insulin aspart  0-5 Units Subcutaneous QHS  . insulin aspart  0-9 Units Subcutaneous TID WC  . insulin glargine  3 Units Subcutaneous QHS  . mouth rinse  15 mL Mouth Rinse BID  . midodrine  10 mg Oral Q M,W,F-HD  . multivitamin  1 tablet Oral QHS  . pantoprazole  40 mg Oral Daily  . PARoxetine  10 mg Oral Daily  . polyethylene glycol  17 g Oral Daily  . senna-docusate  1 tablet Oral QHS  . simvastatin  40 mg Oral q1800  . sucralfate  1 g Oral TID WC & HS  . Warfarin - Pharmacist Dosing Inpatient   Does not apply q1800   Continuous Infusions: .  sodium chloride    . sodium chloride 0.9 % 50 mL with thiamine (B-1) 250 mg infusion 100 mL/hr at 06/08/17 2152   PRN Meds:.acetaminophen, camphor-menthol, feeding supplement (NEPRO CARB STEADY), guaiFENesin, hydrALAZINE, hydrOXYzine, linaclotide, methocarbamol, ondansetron **OR** ondansetron (ZOFRAN) IV, oxyCODONE-acetaminophen, phenol, sodium chloride flush, triamcinolone cream   Ashlee Grippe  MD 06/09/2017, 8:30 AM

## 2017-06-12 ENCOUNTER — Encounter (HOSPITAL_COMMUNITY): Payer: Self-pay

## 2017-06-12 ENCOUNTER — Other Ambulatory Visit: Payer: Self-pay

## 2017-06-12 ENCOUNTER — Emergency Department (HOSPITAL_COMMUNITY)
Admission: EM | Admit: 2017-06-12 | Discharge: 2017-06-13 | Disposition: A | Discharge status: Home / Self Care | Payer: 59 | Attending: Emergency Medicine | Admitting: Emergency Medicine

## 2017-06-12 ENCOUNTER — Emergency Department (HOSPITAL_BASED_OUTPATIENT_CLINIC_OR_DEPARTMENT_OTHER)
Admit: 2017-06-12 | Discharge: 2017-06-12 | Disposition: A | Discharge status: Home / Self Care | Payer: 59 | Attending: Emergency Medicine | Admitting: Emergency Medicine

## 2017-06-12 DIAGNOSIS — Z992 Dependence on renal dialysis: Secondary | ICD-10-CM | POA: Insufficient documentation

## 2017-06-12 DIAGNOSIS — N186 End stage renal disease: Secondary | ICD-10-CM | POA: Diagnosis not present

## 2017-06-12 DIAGNOSIS — Z5181 Encounter for therapeutic drug level monitoring: Secondary | ICD-10-CM | POA: Diagnosis not present

## 2017-06-12 DIAGNOSIS — I110 Hypertensive heart disease with heart failure: Secondary | ICD-10-CM | POA: Insufficient documentation

## 2017-06-12 DIAGNOSIS — Z8673 Personal history of transient ischemic attack (TIA), and cerebral infarction without residual deficits: Secondary | ICD-10-CM | POA: Insufficient documentation

## 2017-06-12 DIAGNOSIS — R2242 Localized swelling, mass and lump, left lower limb: Secondary | ICD-10-CM | POA: Insufficient documentation

## 2017-06-12 DIAGNOSIS — Z4803 Encounter for change or removal of drains: Secondary | ICD-10-CM | POA: Insufficient documentation

## 2017-06-12 DIAGNOSIS — Z7901 Long term (current) use of anticoagulants: Secondary | ICD-10-CM | POA: Diagnosis not present

## 2017-06-12 DIAGNOSIS — I5032 Chronic diastolic (congestive) heart failure: Secondary | ICD-10-CM | POA: Insufficient documentation

## 2017-06-12 DIAGNOSIS — Z5189 Encounter for other specified aftercare: Secondary | ICD-10-CM | POA: Diagnosis not present

## 2017-06-12 DIAGNOSIS — E1122 Type 2 diabetes mellitus with diabetic chronic kidney disease: Secondary | ICD-10-CM | POA: Diagnosis not present

## 2017-06-12 DIAGNOSIS — I120 Hypertensive chronic kidney disease with stage 5 chronic kidney disease or end stage renal disease: Secondary | ICD-10-CM | POA: Diagnosis not present

## 2017-06-12 DIAGNOSIS — M7989 Other specified soft tissue disorders: Secondary | ICD-10-CM | POA: Diagnosis not present

## 2017-06-12 LAB — CBC WITH DIFFERENTIAL/PLATELET
Basophils Absolute: 0 10*3/uL (ref 0.0–0.1)
Basophils Relative: 0 %
Eosinophils Absolute: 0.4 10*3/uL (ref 0.0–0.7)
Eosinophils Relative: 5 %
HCT: 36.7 % (ref 36.0–46.0)
Hemoglobin: 11.9 g/dL — ABNORMAL LOW (ref 12.0–15.0)
Lymphocytes Relative: 28 %
Lymphs Abs: 2 10*3/uL (ref 0.7–4.0)
MCH: 27.4 pg (ref 26.0–34.0)
MCHC: 32.4 g/dL (ref 30.0–36.0)
MCV: 84.6 fL (ref 78.0–100.0)
Monocytes Absolute: 0.3 10*3/uL (ref 0.1–1.0)
Monocytes Relative: 4 %
Neutro Abs: 4.5 10*3/uL (ref 1.7–7.7)
Neutrophils Relative %: 63 %
Platelets: 283 10*3/uL (ref 150–400)
RBC: 4.34 MIL/uL (ref 3.87–5.11)
RDW: 15.7 % — ABNORMAL HIGH (ref 11.5–15.5)
WBC: 7.3 10*3/uL (ref 4.0–10.5)

## 2017-06-12 LAB — PROTIME-INR
INR: 1.45
Prothrombin Time: 17.5 seconds — ABNORMAL HIGH (ref 11.4–15.2)

## 2017-06-12 LAB — COMPREHENSIVE METABOLIC PANEL
ALT: 6 U/L — ABNORMAL LOW (ref 14–54)
AST: 21 U/L (ref 15–41)
Albumin: 3 g/dL — ABNORMAL LOW (ref 3.5–5.0)
Alkaline Phosphatase: 77 U/L (ref 38–126)
Anion gap: 12 (ref 5–15)
BUN: 48 mg/dL — ABNORMAL HIGH (ref 6–20)
CO2: 27 mmol/L (ref 22–32)
Calcium: 10 mg/dL (ref 8.9–10.3)
Chloride: 99 mmol/L — ABNORMAL LOW (ref 101–111)
Creatinine, Ser: 6.94 mg/dL — ABNORMAL HIGH (ref 0.44–1.00)
GFR calc Af Amer: 6 mL/min — ABNORMAL LOW (ref >60)
GFR calc non Af Amer: 5 mL/min — ABNORMAL LOW (ref >60)
Glucose, Bld: 198 mg/dL — ABNORMAL HIGH (ref 65–99)
Potassium: 4.1 mmol/L (ref 3.5–5.1)
Sodium: 138 mmol/L (ref 135–145)
Total Bilirubin: 0.6 mg/dL (ref 0.3–1.2)
Total Protein: 7 g/dL (ref 6.5–8.1)

## 2017-06-12 LAB — CBG MONITORING, ED: Glucose-Capillary: 147 mg/dL — ABNORMAL HIGH (ref 65–99)

## 2017-06-12 MED ORDER — WARFARIN - PHARMACIST DOSING INPATIENT: Freq: Every day | Status: DC

## 2017-06-12 MED ORDER — MIDODRINE HCL 5 MG PO TABS: 10.0000 mg | ORAL_TABLET | ORAL | Status: DC

## 2017-06-12 MED ORDER — DOCUSATE SODIUM 100 MG PO CAPS
100.0000 mg | ORAL_CAPSULE | Freq: Two times a day (BID) | ORAL | Status: DC
  Administered 2017-06-12: 100 mg via ORAL
  Filled 2017-06-12: qty 1

## 2017-06-12 MED ORDER — CARVEDILOL 12.5 MG PO TABS
6.2500 mg | ORAL_TABLET | Freq: Two times a day (BID) | ORAL | Status: DC
  Administered 2017-06-12: 6.25 mg via ORAL
  Filled 2017-06-12: qty 1

## 2017-06-12 MED ORDER — ENOXAPARIN SODIUM 100 MG/ML ~~LOC~~ SOLN: 100.0000 mg | SUBCUTANEOUS | 0 refills | Status: DC

## 2017-06-12 MED ORDER — GABAPENTIN 300 MG PO CAPS
300.0000 mg | ORAL_CAPSULE | Freq: Two times a day (BID) | ORAL | Status: DC
  Administered 2017-06-12: 300 mg via ORAL
  Filled 2017-06-12: qty 1

## 2017-06-12 MED ORDER — MORPHINE SULFATE (PF) 4 MG/ML IV SOLN
4.0000 mg | Freq: Once | INTRAVENOUS | Status: AC
  Administered 2017-06-12: 4 mg via INTRAVENOUS
  Filled 2017-06-12: qty 1

## 2017-06-12 MED ORDER — INSULIN ASPART 100 UNIT/ML ~~LOC~~ SOLN: 0.0000 [IU] | Freq: Three times a day (TID) | SUBCUTANEOUS | Status: DC

## 2017-06-12 MED ORDER — INSULIN GLARGINE 100 UNIT/ML ~~LOC~~ SOLN
3.0000 [IU] | Freq: Every day | SUBCUTANEOUS | Status: DC
  Administered 2017-06-12: 3 [IU] via SUBCUTANEOUS
  Filled 2017-06-12: qty 0.03

## 2017-06-12 MED ORDER — ACETAMINOPHEN 325 MG PO TABS
650.0000 mg | ORAL_TABLET | Freq: Four times a day (QID) | ORAL | Status: DC | PRN
  Administered 2017-06-12: 650 mg via ORAL
  Filled 2017-06-12: qty 2

## 2017-06-12 MED ORDER — ENOXAPARIN SODIUM 100 MG/ML ~~LOC~~ SOLN
100.0000 mg | Freq: Once | SUBCUTANEOUS | Status: AC
  Administered 2017-06-12: 100 mg via SUBCUTANEOUS
  Filled 2017-06-12: qty 1

## 2017-06-12 MED ORDER — WARFARIN SODIUM 7.5 MG PO TABS
7.5000 mg | ORAL_TABLET | Freq: Once | ORAL | Status: AC
  Administered 2017-06-12: 7.5 mg via ORAL
  Filled 2017-06-12: qty 1

## 2017-06-12 NOTE — ED Notes (Signed)
ED Provider at bedside. 

## 2017-06-12 NOTE — Progress Notes (Signed)
ANTICOAGULATION CONSULT NOTE - Initial Consult  Pharmacy Consult for Warfarin  Indication: Hx DVT, protein C deficiency   No Known Allergies  Patient Measurements: Height: 5\' 2"  (157.5 cm) Weight: 211 lb (95.7 kg) IBW/kg (Calculated) : 50.1  Vital Signs: Temp: 98.1 F (36.7 C) (12/24 1727) Temp Source: Oral (12/24 1727) BP: 126/55 (12/24 2030) Pulse Rate: 64 (12/24 2030)  Labs: Recent Labs    06/12/17 1930  HGB 11.9*  HCT 36.7  PLT 283  LABPROT 17.5*  INR 1.45  CREATININE 6.94*   Medical History: Past Medical History:  Diagnosis Date  . CHF (congestive heart failure) ([place])   . Diabetes mellitus without complication ([place])   . ESRD (end stage renal disease) on dialysis ([place])   . History of pituitary adenoma   . Hypertension   . Protein C deficiency [facility])    Assessment: 76 y/o female on Coumadin PTA for hx protein C def/DVT. Admit INR 4 on 12/12 with Hgb 7. Noted to have massive L-thigh hematoma of her L-fem AVGG. INR reversed with Vit K 10 mg IV x 1 and given FFP x 2 + PRBC x 9. Hematoma evacuated 12/14.  12/24: pt presents to the ED with leaking from around wound. Hgb 11.9 (up from discharge Hgb of 9.5). INR is sub-therapeutic at 1.45.  Goal of Therapy:  INR 2-3 Monitor platelets by anticoagulation protocol: Yes   Plan:  Warfarin 7.5 mg PO x 1 now Daily PT/INR Monitor for bleeding  Ashlee Miles 06/12/2017,10:12 PM

## 2017-06-12 NOTE — ED Notes (Signed)
Case manager at bedside 

## 2017-06-12 NOTE — ED Provider Notes (Signed)
[facility] EMERGENCY DEPARTMENT Provider Note   CSN: [id number] Arrival date & time: 06/12/17  1720     History   Chief Complaint Chief Complaint  Patient presents with  . Wound Check  . Vascular Access Problem    HPI Ashlee Miles is a 76 y.o. 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  Patient was recently admitted to the hospital and had left thigh graft revision as well as wound VAC that was removed.  Patient also was started on Coumadin and bridged with Lovenox.  Visiting nurse came and saw patient and sent patient to the ER because she was concerned of the appearance of the wound.  There was no drainage from the wound and patient denies any fevers, just noticed some warmness around the wound.  Patient is not currently on antibiotics.  Of note, patient was sent home 3 days ago on Lovenox bridge to Coumadin but apparently Lovenox was not prescribed so she has not been getting it. Last dialysis was 3 days ago.   The history is provided by the patient.    Past Medical History:  Diagnosis Date  . CHF (congestive heart failure) ([place])   . Diabetes mellitus without complication ([place])   . ESRD (end stage renal disease) on dialysis ([place])   . History of pituitary adenoma   . Hypertension   . Protein C deficiency [facility])     Patient Active Problem List   Diagnosis Date Noted  . Altered mental status   . Diabetes mellitus with end stage renal disease ([place]) 05/31/2017  . Depression 05/31/2017  . GERD (gastroesophageal reflux disease) 05/31/2017  . Left groin pain 05/31/2017  . Protein C deficiency ([place])   . Left leg swelling 05/09/2017  . 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 Falls 04/25/2017  . Hypotension 04/25/2017  . Epigastric pain 04/25/2017  . Constipation 04/25/2017  . ESRD (end stage renal disease) on dialysis ([place])   . ESRD on dialysis ([place])  04/06/2017  . Anemia due to chronic kidney disease 04/06/2017  . Thrombocytopenia ([place]) 04/06/2017  . Cough in adult 04/06/2017  . DVT (deep venous thrombosis) ([place]) 04/05/2017  . Pressure injury of skin 03/14/2017  . Acute metabolic encephalopathy 85/07/7739  . Skin lesion of hand 03/12/2017  . TIA (transient ischemic attack) 03/01/2017  . Syncope 03/01/2017  . Diabetes mellitus with complication ([place]) 28/78/6767  . Essential hypertension 03/01/2017  . Chronic diastolic CHF (congestive heart failure) ([place]) 03/01/2017    Past Surgical History:  Procedure Laterality Date  . AV FISTULA PLACEMENT Left 03/16/2017   Procedure: INSERTION OF ARTERIOVENOUS (AV) GORE-TEX GRAFT THIGH-LEFT;  Surgeon: Angelia Mould, MD;  Location: [facility];  Service: Vascular;  Laterality: Left;  . CHOLECYSTECTOMY    . DIALYSIS FISTULA CREATION     clotted off  . FALSE ANEURYSM REPAIR Left 05/10/2017   Procedure: REPAIR FALSE ANEURYSM left thigh AVGG using Gore 9mmx5cm Viabahn Endoprosthesis;  Surgeon: Serafina Mitchell, MD;  Location: [facility] OR;  Service: Vascular;  Laterality: Left;  . INSERTION OF DIALYSIS CATHETER Right 03/16/2017   Procedure: INSERTION OF RIGHT FEMORAL TUNNELED DIALYSIS CATHETER;  Surgeon: Angelia Mould, MD;  Location: [facility];  Service: Vascular;  Laterality: Right;  . IVC FILTER INSERTION    . PORTACATH PLACEMENT    . THROMBECTOMY AND REVISION OF ARTERIOVENTOUS (AV) GORETEX  GRAFT Left 06/02/2017   Procedure: EVACUATION OF LEFT  THIGH HEMATOMA  AND APPLICATION OF WOUND VAC;  Surgeon: Serafina Mitchell, MD;  Location: [facility] OR;  Service: Vascular;  Laterality: Left;    OB History    No data available       Home Medications    Prior to Admission medications   Medication Sig Start Date End Date Taking? Authorizing Provider  acetaminophen (TYLENOL) 325 MG tablet Take 650 mg by mouth every 6 (six) hours as needed for mild pain.   Yes [provider]  camphor-menthol Timoteo Ace)  lotion Apply 1 application topically every 8 (eight) hours as needed for itching. 04/12/17  Yes Sheikh, Omair Latif, DO  carvedilol (COREG) 6.25 MG tablet Take 1 tablet (6.25 mg total) by mouth 2 (two) times daily. 03/04/17 03/04/18 Yes Hosie Poisson, MD  docusate sodium (COLACE) 100 MG capsule Take 1 capsule (100 mg total) by mouth 2 (two) times daily. 04/12/17  Yes Sheikh, Omair Latif, DO  famotidine (PEPCID) 20 MG tablet Take 1 tablet (20 mg total) daily by mouth. 04/28/17  Yes Lavina Hamman, MD  gabapentin (NEURONTIN) 300 MG capsule Take 1 capsule (300 mg total) by mouth at bedtime. Patient taking differently: Take 300 mg by mouth 2 (two) times daily.  06/09/17  Yes Dessa Phi, DO  guaiFENesin (MUCINEX) 600 MG 12 hr tablet Take 600 mg by mouth 2 (two) times daily as needed.   Yes [provider]  hydrOXYzine (ATARAX/VISTARIL) 25 MG tablet Take 1 tablet (25 mg total) by mouth every 8 (eight) hours as needed for itching. 04/12/17  Yes Sheikh, Omair Latif, DO  insulin aspart (NOVOLOG) 100 UNIT/ML injection Inject 0-9 Units into the skin 3 (three) times daily with meals. 03/22/17  Yes Debbe Odea, MD  insulin glargine (LANTUS) 100 UNIT/ML injection Inject 0.03 mLs (3 Units total) into the skin at bedtime. 06/09/17  Yes Dessa Phi, DO  ipratropium-albuterol (DUONEB) 0.5-2.5 (3) MG/3ML SOLN Take 3 mLs by nebulization every 4 (four) hours as needed. 04/12/17  Yes Sheikh, Omair Latif, DO  linaclotide (LINZESS) 145 MCG CAPS capsule Take 145 mcg by mouth daily as needed.    Yes [provider]  midodrine (PROAMATINE) 10 MG tablet Take 1 tablet (10 mg total) by mouth every Monday, Wednesday, and Friday with hemodialysis. 04/14/17  Yes Sheikh, Omair Latif, DO  multivitamin (RENA-VIT) TABS tablet Take 1 tablet by mouth at bedtime. 04/12/17  Yes Sheikh, Omair Latif, DO  pantoprazole (PROTONIX) 40 MG tablet Take 1 tablet (40 mg total) 2 (two) times daily before a meal by mouth. 04/28/17   Yes Lavina Hamman, MD  PARoxetine (PAXIL) 10 MG tablet Take 1 tablet (10 mg total) daily by mouth. 04/28/17  Yes Lavina Hamman, MD  ranitidine (ZANTAC) 150 MG tablet Take 150 mg by mouth 2 (two) times daily.   Yes [provider]  simvastatin (ZOCOR) 40 MG tablet Take 40 mg by mouth daily at 6 PM.   Yes [provider]  sucralfate (CARAFATE) 1 GM/10ML suspension Take 10 mLs (1 g total) 4 (four) times daily -  with meals and at bedtime by mouth. 04/28/17  Yes Lavina Hamman, MD  thiamine (VITAMIN B-1) 100 MG tablet Take 1 tablet (100 mg total) by mouth daily. 06/09/17  Yes Dessa Phi, DO  triamcinolone cream (KENALOG) 0.1 % Apply 1 application topically every 12 (twelve) hours as needed.    Yes [provider]  warfarin (COUMADIN) 5 MG tablet 5mg  M/W/F and 2.5mg  Tu/Th/Sa/Su Patient taking differently: Take  5 mg by mouth one time only at 6 PM. 5mg  M/W/F and 2.5mg  Tu/Th/Sa/Su 06/09/17  Yes Dessa Phi, DO  Amino Acids-Protein Hydrolys (FEEDING SUPPLEMENT, PRO-STAT SUGAR FREE 64,) LIQD Take 30 mLs by mouth 2 (two) times daily. Patient not taking: Reported on 05/31/2017 04/12/17   Raiford Noble Latif, DO  enoxaparin (LOVENOX) 100 MG/ML injection Inject 1 mL (100 mg total) into the skin daily. 06/12/17   Drenda Freeze, MD  methocarbamol (ROBAXIN) 500 MG tablet Take 1 tablet (500 mg total) every 8 (eight) hours as needed by mouth for muscle spasms. Patient not taking: Reported on 05/31/2017 04/28/17   Lavina Hamman, MD  polyethylene glycol [facility] / Floria Raveling) packet Take 17 g daily by mouth. Patient not taking: Reported on 05/31/2017 04/28/17   Lavina Hamman, MD    Family History Family History  Problem Relation Age of Onset  . Breast cancer Mother   . Diabetes Sister   . Diabetes Brother   . CAD Other   . Stroke Neg Hx     Social History Social History   Tobacco Use  . Smoking status: Never Smoker  . Smokeless tobacco: Never Used  Substance Use  Topics  . Alcohol use: No  . Drug use: No     Allergies   Patient has no known allergies.   Review of Systems Review of Systems  Skin: Positive for wound.  All other systems reviewed and are negative.    Physical Exam Updated Vital Signs BP (!) 126/55   Pulse 64   Temp 98.1 F (36.7 C) (Oral)   Resp 13   Ht 5\' 2"  (1.575 m)   Wt 95.7 kg (211 lb)   SpO2 99%   BMI 38.59 kg/m   Physical Exam  Constitutional: She appears well-developed.  HENT:  Head: Normocephalic.  Mouth/Throat: Oropharynx is clear and moist.  Eyes: Conjunctivae and EOM are normal. Pupils are equal, round, and reactive to light.  Neck: Normal range of motion. Neck supple.  Cardiovascular: Normal rate, regular rhythm and normal heart sounds.  Pulmonary/Chest: Effort normal and breath sounds normal. No stridor. No respiratory distress.  Abdominal: Soft. Bowel sounds are normal. She exhibits no distension. There is no tenderness. There is no guarding.  Musculoskeletal:  L thigh wound with no obvious purulent drainage, healing well. L thigh graft with bruit  Neurological: She is alert.  Skin: Skin is warm.  Psychiatric: She has a normal mood and affect.  Nursing note and vitals reviewed.      ED Treatments / Results  Labs (all labs ordered are listed, but only abnormal results are displayed) Labs Reviewed  CBC WITH DIFFERENTIAL/PLATELET - Abnormal; Notable for the following components:      Result Value   Hemoglobin 11.9 (*)    RDW 15.7 (*)    All other components within normal limits  COMPREHENSIVE METABOLIC PANEL - Abnormal; Notable for the following components:   Chloride 99 (*)    Glucose, Bld 198 (*)    BUN 48 (*)    Creatinine, Ser 6.94 (*)    Albumin 3.0 (*)    ALT 6 (*)    GFR calc non Af Amer 5 (*)    GFR calc Af Amer 6 (*)    All other components within normal limits  PROTIME-INR - Abnormal; Notable for the following components:   Prothrombin Time 17.5 (*)    All other  components within normal limits  PROTIME-INR    EKG  EKG  Interpretation  Date/Time:  Monday June 12 2017 17:23:34 EST Ventricular Rate:  64 PR Interval:    QRS Duration: 82 QT Interval:  403 QTC Calculation: 416 R Axis:   9 Text Interpretation:  Sinus rhythm No significant change since last tracing Confirmed by Wandra Arthurs [phone]) on 06/12/2017 5:45:23 PM       Radiology No results found.  Procedures Procedures (including critical care time)  Angiocath insertion Performed by: Wandra Arthurs  Consent: Verbal consent obtained. Risks and benefits: risks, benefits and alternatives were discussed Time out: Immediately prior to procedure a "time out" was called to verify the correct patient, procedure, equipment, support staff and site/side marked as required.  Preparation: Patient was prepped and draped in the usual sterile fashion.  Vein Location: R antecube  Ultrasound Guided  Gauge: 20 long  Normal blood return and flush without difficulty Patient tolerance: Patient tolerated the procedure well with no immediate complications.     Medications Ordered in ED Medications  acetaminophen (TYLENOL) tablet 650 mg (not administered)  carvedilol (COREG) tablet 6.25 mg (not administered)  docusate sodium (COLACE) capsule 100 mg (not administered)  gabapentin (NEURONTIN) capsule 300 mg (not administered)  insulin aspart (novoLOG) injection 0-9 Units (not administered)  insulin glargine (LANTUS) injection 3 Units (not administered)  midodrine (PROAMATINE) tablet 10 mg (not administered)  warfarin (COUMADIN) tablet 7.5 mg (not administered)  Warfarin - Pharmacist Dosing Inpatient (not administered)  morphine 4 MG/ML injection 4 mg (4 mg Intravenous Given 06/12/17 1934)  enoxaparin (LOVENOX) injection 100 mg (100 mg Subcutaneous Given 06/12/17 2048)     Initial Impression / Assessment and Plan / ED Course  I have reviewed the triage vital signs and the nursing  notes.  Pertinent labs & imaging results that were available during my care of the patient were reviewed by me and considered in my medical decision making (see chart for details).     Ashlee Miles is a 76 y.o. female here with L thigh wound. Patient had recent pseudoaneurysm that was evacuated and wound vac that was removed. Patient is on coumadin. Will get labs, INR.   10 pm Patient's INR 1.5. Given lovenox. Daughter at bedside and told me that the lovenox was never prescribed so [facility] hasn't been able to give it to her. She is taking her coumadin however. I talked to Mariann Laster, Tourist information centre manager. She is able to fax over the lovenox prescription. She also found out that the visiting nurse today took off the wound vac. Plan to have wound care nurse see patient.   11:32 PM Daughter went home to get wound vac. Wound care nurse to see patient and attempt to place patient on wound vac. Dr. Leonides Schanz aware of patient. Anticipate dc home when wound vac is placed. Home meds ordered. Doesn't need emergent dialysis.   Final Clinical Impressions(s) / ED Diagnoses   Final diagnoses:  None    ED Discharge Orders        Ordered    enoxaparin (LOVENOX) 100 MG/ML injection  Every 24 hours     06/12/17 2140       Drenda Freeze, MD 06/12/17 [phone]

## 2017-06-12 NOTE — ED Notes (Addendum)
PLEASE NOTE: call light is NOT working but it doesn't seem to be an issue with the remote (remote is still able to turn TV on and off; completely functional except call light). Call maintenance? Pt's family notified to come to desk for anything.   Pt's family expressed concern that Pt is in a great deal of pain and is wondering when the pain medication will arrive.   Vascular tech at bedside currently (18:32).

## 2017-06-12 NOTE — Progress Notes (Signed)
Left lower extremity venous duplex has been completed. Negative for obvious evidence of DVT. Results were given to the patient's nurse, Jeanine Luz.   06/12/17 6:48 PM Carlos Levering RVT

## 2017-06-12 NOTE — Progress Notes (Signed)
ED CM received consult from Dr. Darl Householder concerning patient who was discharged without Lovenox prescription. CM met with patient and daughter [name] caregiver. Patient and daughter report being sent to the ED by [facility] when it was discovered that she had not been given prescription for Lovenox. As per, patient and daughter nurse removed Wound vac dsg and placed a wet to dry dressing on and sent patient to the ED. CM unable to confirm wound vac set up tonight with [facility]. CM will follow up tomorrow with [facility] rep for portable wound vac set up. Updated EDP and ED Charge.

## 2017-06-12 NOTE — ED Triage Notes (Signed)
Per PTAR, pt from home and [facility] after removal of clot/aneurysm from graft. Vascular access elected to leave wound open. Pt reports leaking from from wound. Home health removed wound vac and placed ABD pad. Wound on inner left thigh. PTAR reports distal pulses present, but left pulses weaker. Pt can stand and pivot. Initial BP of 210/80  And last BP of 160/80. Pt HX of CHF, DM, renal failure, and HTN. PTAR reports can use both upper extremities for BP.

## 2017-06-12 NOTE — Discharge Instructions (Signed)
You are prescribed lovenox shots daily for a week.   Take coumadin as prescribed (2.5 mg Tu/Th/Sa/Su and 5 mg Mon/Wed/Fri).   [facility] will come to your house to give you the lovenox shots and change your wound dressing.   See your doctor this week   Return to ER if you have worse pain, fever, trouble breathing, worse redness around the wound, purulent discharge from the wound.

## 2017-06-12 NOTE — ED Provider Notes (Signed)
11:30 PM  Assumed care from Dr. Darl Householder.  Patient awaiting wound vac replacement and then will be discharged home.  Was dialyzed last on Friday 06/09/17 and scheduled for dialysis 06/14/17.  Does not need emergent dialysis.   Spoke to [facility] case Freight forwarder.  They have the supplies to replace the wound VAC in the emergency department and nursing staff will replace that and then patient be discharged home tonight.   12:30 AM  Pt's wound VAC has been replaced.  Lovenox prescription faxed to [facility] on [address].  Received a dose of Lovenox tonight.  We will continue her Coumadin.  Will be discharged home with her daughter.   Ashlee Miles, Delice Bison, DO 06/13/17 [phone]

## 2017-06-12 NOTE — ED Notes (Signed)
Applied pure wick to Pt

## 2017-06-13 NOTE — Care Management (Signed)
ED CM spoke with patient and daughter concerning wound vac. Daughter now informs me that patient has a portable wound vac at home and she could have someone bring vac to ED. CM spoke with charge nurse and Ethelda Chick concerning replace wound dsg in ED for d/c tonight.  CM spoke with daughter concerning Lovenox prescription, CM informed patient and daughter [facility] [address] [facility] only [facility] open is [place] today. CM offered to fax prescription faxed to [facility]. Updated EDP and Megan RN on Pod C of transitional care plan.

## 2017-06-15 ENCOUNTER — Emergency Department (HOSPITAL_COMMUNITY): Payer: 59

## 2017-06-15 ENCOUNTER — Emergency Department (HOSPITAL_COMMUNITY)
Admission: EM | Admit: 2017-06-15 | Discharge: 2017-06-15 | Disposition: A | Discharge status: Home / Self Care | Payer: 59 | Attending: Emergency Medicine | Admitting: Emergency Medicine

## 2017-06-15 ENCOUNTER — Encounter (HOSPITAL_COMMUNITY): Payer: Self-pay | Admitting: Emergency Medicine

## 2017-06-15 DIAGNOSIS — Z992 Dependence on renal dialysis: Secondary | ICD-10-CM | POA: Insufficient documentation

## 2017-06-15 DIAGNOSIS — X58XXXA Exposure to other specified factors, initial encounter: Secondary | ICD-10-CM | POA: Insufficient documentation

## 2017-06-15 DIAGNOSIS — Z8673 Personal history of transient ischemic attack (TIA), and cerebral infarction without residual deficits: Secondary | ICD-10-CM | POA: Diagnosis not present

## 2017-06-15 DIAGNOSIS — R5383 Other fatigue: Secondary | ICD-10-CM | POA: Diagnosis not present

## 2017-06-15 DIAGNOSIS — S59912A Unspecified injury of left forearm, initial encounter: Secondary | ICD-10-CM | POA: Diagnosis present

## 2017-06-15 DIAGNOSIS — Z79899 Other long term (current) drug therapy: Secondary | ICD-10-CM | POA: Insufficient documentation

## 2017-06-15 DIAGNOSIS — I132 Hypertensive heart and chronic kidney disease with heart failure and with stage 5 chronic kidney disease, or end stage renal disease: Secondary | ICD-10-CM | POA: Diagnosis not present

## 2017-06-15 DIAGNOSIS — Z794 Long term (current) use of insulin: Secondary | ICD-10-CM | POA: Insufficient documentation

## 2017-06-15 DIAGNOSIS — E1122 Type 2 diabetes mellitus with diabetic chronic kidney disease: Secondary | ICD-10-CM | POA: Diagnosis not present

## 2017-06-15 DIAGNOSIS — Y999 Unspecified external cause status: Secondary | ICD-10-CM | POA: Insufficient documentation

## 2017-06-15 DIAGNOSIS — I5032 Chronic diastolic (congestive) heart failure: Secondary | ICD-10-CM | POA: Diagnosis not present

## 2017-06-15 DIAGNOSIS — M79605 Pain in left leg: Secondary | ICD-10-CM | POA: Insufficient documentation

## 2017-06-15 DIAGNOSIS — D631 Anemia in chronic kidney disease: Secondary | ICD-10-CM | POA: Diagnosis not present

## 2017-06-15 DIAGNOSIS — Z7901 Long term (current) use of anticoagulants: Secondary | ICD-10-CM | POA: Insufficient documentation

## 2017-06-15 DIAGNOSIS — Y939 Activity, unspecified: Secondary | ICD-10-CM | POA: Diagnosis not present

## 2017-06-15 DIAGNOSIS — Y929 Unspecified place or not applicable: Secondary | ICD-10-CM | POA: Insufficient documentation

## 2017-06-15 DIAGNOSIS — N186 End stage renal disease: Secondary | ICD-10-CM | POA: Diagnosis not present

## 2017-06-15 DIAGNOSIS — S52502A Unspecified fracture of the lower end of left radius, initial encounter for closed fracture: Secondary | ICD-10-CM | POA: Diagnosis not present

## 2017-06-15 LAB — CBC
HCT: 32.5 % — ABNORMAL LOW (ref 36.0–46.0)
Hemoglobin: 10.4 g/dL — ABNORMAL LOW (ref 12.0–15.0)
MCH: 26.9 pg (ref 26.0–34.0)
MCHC: 32 g/dL (ref 30.0–36.0)
MCV: 84.2 fL (ref 78.0–100.0)
Platelets: 243 10*3/uL (ref 150–400)
RBC: 3.86 MIL/uL — ABNORMAL LOW (ref 3.87–5.11)
RDW: 16.1 % — ABNORMAL HIGH (ref 11.5–15.5)
WBC: 7.6 10*3/uL (ref 4.0–10.5)

## 2017-06-15 LAB — BASIC METABOLIC PANEL
Anion gap: 12 (ref 5–15)
BUN: 70 mg/dL — ABNORMAL HIGH (ref 6–20)
CO2: 22 mmol/L (ref 22–32)
Calcium: 8.9 mg/dL (ref 8.9–10.3)
Chloride: 102 mmol/L (ref 101–111)
Creatinine, Ser: 7.44 mg/dL — ABNORMAL HIGH (ref 0.44–1.00)
GFR calc Af Amer: 5 mL/min — ABNORMAL LOW (ref >60)
GFR calc non Af Amer: 5 mL/min — ABNORMAL LOW (ref >60)
Glucose, Bld: 187 mg/dL — ABNORMAL HIGH (ref 65–99)
Potassium: 4.8 mmol/L (ref 3.5–5.1)
Sodium: 136 mmol/L (ref 135–145)

## 2017-06-15 MED ORDER — TRAMADOL HCL 50 MG PO TABS: 50.0000 mg | ORAL_TABLET | Freq: Four times a day (QID) | ORAL | 0 refills | Status: DC | PRN

## 2017-06-15 MED ORDER — HEPARIN SOD (PORK) LOCK FLUSH 100 UNIT/ML IV SOLN
500.0000 [IU] | Freq: Once | INTRAVENOUS | Status: AC
  Administered 2017-06-15: 500 [IU]
  Filled 2017-06-15: qty 5

## 2017-06-15 MED ORDER — ACETAMINOPHEN 325 MG PO TABS
650.0000 mg | ORAL_TABLET | Freq: Once | ORAL | Status: AC
  Administered 2017-06-15: 650 mg via ORAL
  Filled 2017-06-15: qty 2

## 2017-06-15 NOTE — ED Provider Notes (Addendum)
[place] EMERGENCY DEPARTMENT Provider Note   CSN: [id number] Arrival date & time: 06/15/17  1326     History   Chief Complaint Chief Complaint  Patient presents with  . Leg Swelling  . Fatigue    HPI Ashlee Miles is a 76 y.o. female.  Patient is here for evaluation of generalized weakness, with pain, and missing dialysis yesterday.  Apparently her left leg graft could not be accessed, yesterday by staff.  She was recently started on Lovenox, but has been unable to afford the prescription to take for bridging, with Coumadin treatment, for left leg DVT.  She is taking the Coumadin.  She complains of pain in left wrist from a fall, 2 weeks ago.  She denies nausea, vomiting, weakness or dizziness.  She has not had any palpitations, or chest pain.  There are no other known modifying factors.  HPI  Past Medical History:  Diagnosis Date  . CHF (congestive heart failure) ([place])   . Diabetes mellitus without complication ([place])   . ESRD (end stage renal disease) on dialysis ([place])   . History of pituitary adenoma   . Hypertension   . Protein C deficiency [facility])     Patient Active Problem List   Diagnosis Date Noted  . Altered mental status   . Diabetes mellitus with end stage renal disease ([place]) 05/31/2017  . Depression 05/31/2017  . GERD (gastroesophageal reflux disease) 05/31/2017  . Left groin pain 05/31/2017  . Protein C deficiency ([place])   . Left leg swelling 05/09/2017  . 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 Falls 04/25/2017  . Hypotension 04/25/2017  . Epigastric pain 04/25/2017  . Constipation 04/25/2017  . ESRD (end stage renal disease) on dialysis ([place])   . ESRD on dialysis ([place]) 04/06/2017  . Anemia due to chronic kidney disease 04/06/2017  . Thrombocytopenia ([place]) 04/06/2017  . Cough in adult 04/06/2017  . DVT (deep venous thrombosis) ([place]) 04/05/2017  . Pressure injury of skin 03/14/2017  . Acute metabolic encephalopathy 33/82/5053  . Skin  lesion of hand 03/12/2017  . TIA (transient ischemic attack) 03/01/2017  . Syncope 03/01/2017  . Diabetes mellitus with complication ([place]) 97/67/3419  . Essential hypertension 03/01/2017  . Chronic diastolic CHF (congestive heart failure) ([place]) 03/01/2017    Past Surgical History:  Procedure Laterality Date  . AV FISTULA PLACEMENT Left 03/16/2017   Procedure: INSERTION OF ARTERIOVENOUS (AV) GORE-TEX GRAFT THIGH-LEFT;  Surgeon: Angelia Mould, MD;  Location: [place];  Service: Vascular;  Laterality: Left;  . CHOLECYSTECTOMY    . DIALYSIS FISTULA CREATION     clotted off  . FALSE ANEURYSM REPAIR Left 05/10/2017   Procedure: REPAIR FALSE ANEURYSM left thigh AVGG using Gore 81mmx5cm Viabahn Endoprosthesis;  Surgeon: Serafina Mitchell, MD;  [facility];  Service: Vascular;  Laterality: Left;  . INSERTION OF DIALYSIS CATHETER Right 03/16/2017   Procedure: INSERTION OF RIGHT FEMORAL TUNNELED DIALYSIS CATHETER;  Surgeon: Angelia Mould, MD;  Location: [place];  Service: Vascular;  Laterality: Right;  . IVC FILTER INSERTION    . PORTACATH PLACEMENT    . THROMBECTOMY AND REVISION OF ARTERIOVENTOUS (AV) GORETEX  GRAFT Left 06/02/2017   Procedure: EVACUATION OF LEFT THIGH HEMATOMA  AND APPLICATION OF WOUND VAC;  Surgeon: Serafina Mitchell, MD;  Location: [facility] OR;  Service: Vascular;  Laterality: Left;    OB History    No data available       Home Medications    Prior to  Admission medications   Medication Sig Start Date End Date Taking? Authorizing Provider  acetaminophen (TYLENOL) 325 MG tablet Take 650 mg by mouth every 6 (six) hours as needed for mild pain.    [provider]  Amino Acids-Protein Hydrolys (FEEDING SUPPLEMENT, PRO-STAT SUGAR FREE 64,) LIQD Take 30 mLs by mouth 2 (two) times daily. 04/12/17   Sheikh, Georgina Quint Latif, DO  camphor-[facility]) lotion Apply 1 application topically every 8 (eight) hours as needed for itching. 04/12/17   Sheikh, Omair Latif, DO    carvedilol (COREG) 6.25 MG tablet Take 1 tablet (6.25 mg total) by mouth 2 (two) times daily. 03/04/17 03/04/18  Hosie Poisson, MD  docusate sodium (COLACE) 100 MG capsule Take 1 capsule (100 mg total) by mouth 2 (two) times daily. 04/12/17   Sheikh, Omair Latif, DO  enoxaparin (LOVENOX) 100 MG/ML injection Inject 1 mL (100 mg total) into the skin daily. 06/12/17   Drenda Freeze, MD  famotidine (PEPCID) 20 MG tablet Take 1 tablet (20 mg total) daily by mouth. 04/28/17   Lavina Hamman, MD  gabapentin (NEURONTIN) 300 MG capsule Take 1 capsule (300 mg total) by mouth at bedtime. Patient taking differently: Take 300 mg by mouth 2 (two) times daily.  06/09/17   Dessa Phi, DO  guaiFENesin (MUCINEX) 600 MG 12 hr tablet Take 600 mg by mouth 2 (two) times daily as needed.    [provider]  hydrOXYzine (ATARAX/VISTARIL) 25 MG tablet Take 1 tablet (25 mg total) by mouth every 8 (eight) hours as needed for itching. 04/12/17   Sheikh, Omair Latif, DO  insulin aspart (NOVOLOG) 100 UNIT/ML injection Inject 0-9 Units into the skin 3 (three) times daily with meals. 03/22/17   Debbe Odea, MD  insulin glargine (LANTUS) 100 UNIT/ML injection Inject 0.03 mLs (3 Units total) into the skin at bedtime. 06/09/17   Dessa Phi, DO  ipratropium-albuterol (DUONEB) 0.5-2.5 (3) MG/3ML SOLN Take 3 mLs by nebulization every 4 (four) hours as needed. 04/12/17   Raiford Noble Latif, DO  linaclotide (LINZESS) 145 MCG CAPS capsule Take 145 mcg by mouth daily as needed.     [provider]  methocarbamol (ROBAXIN) 500 MG tablet Take 1 tablet (500 mg total) every 8 (eight) hours as needed by mouth for muscle spasms. 04/28/17   Lavina Hamman, MD  midodrine (PROAMATINE) 10 MG tablet Take 1 tablet (10 mg total) by mouth every Monday, Wednesday, and Friday with hemodialysis. 04/14/17   Raiford Noble Latif, DO  multivitamin (RENA-VIT) TABS tablet Take 1 tablet by mouth at bedtime. 04/12/17   Sheikh, Omair  Latif, DO  pantoprazole (PROTONIX) 40 MG tablet Take 1 tablet (40 mg total) 2 (two) times daily before a meal by mouth. 04/28/17   Lavina Hamman, MD  PARoxetine (PAXIL) 10 MG tablet Take 1 tablet (10 mg total) daily by mouth. 04/28/17   Lavina Hamman, MD  polyethylene glycol [facility] Floria Raveling) packet Take 17 g daily by mouth. 04/28/17   Lavina Hamman, MD  ranitidine (ZANTAC) 150 MG tablet Take 150 mg by mouth 2 (two) times daily.    [provider]  simvastatin (ZOCOR) 40 MG tablet Take 40 mg by mouth daily at 6 PM.    [provider]  sucralfate (CARAFATE) 1 GM/10ML suspension Take 10 mLs (1 g total) 4 (four) times daily -  with meals and at bedtime by mouth. 04/28/17   Lavina Hamman, MD  thiamine (VITAMIN B-1) 100 MG tablet  Take 1 tablet (100 mg total) by mouth daily. 06/09/17   Dessa Phi, DO  traMADol (ULTRAM) 50 MG tablet Take 1 tablet (50 mg total) by mouth every 6 (six) hours as needed. 06/15/17   Daleen Bo, MD  triamcinolone cream (KENALOG) 0.1 % Apply 1 application topically every 12 (twelve) hours as needed.     [provider]  warfarin (COUMADIN) 5 MG tablet 5mg  M/W/F and 2.5mg  Tu/Th/Sa/Su Patient taking differently: Take 5 mg by mouth one time only at 6 PM. 5mg  M/W/F and 2.5mg  Tu/Th/Sa/Su 06/09/17   Dessa Phi, DO    Family History Family History  Problem Relation Age of Onset  . Breast cancer Mother   . Diabetes Sister   . Diabetes Brother   . CAD Other   . Stroke Neg Hx     Social History Social History   Tobacco Use  . Smoking status: Never Smoker  . Smokeless tobacco: Never Used  Substance Use Topics  . Alcohol use: No  . Drug use: No     Allergies   Patient has no known allergies.   Review of Systems Review of Systems  All other systems reviewed and are negative.    Physical Exam Updated Vital Signs BP (!) 144/74   Pulse (!) 58   Temp 97.6 F (36.4 C) (Oral)   Resp 20   SpO2 93%   Physical Exam    Constitutional: She is oriented to person, place, and time. She appears well-developed. No distress.  Overweight  HENT:  Head: Normocephalic and atraumatic.  Eyes: Conjunctivae and EOM are normal. Pupils are equal, round, and reactive to light.  Neck: Normal range of motion and phonation normal. Neck supple.  Cardiovascular: Normal rate and regular rhythm.  Graft left anterior thigh with palpable pulse.  No associated swelling or bleeding.  Pulmonary/Chest: Effort normal and breath sounds normal. She exhibits no tenderness.  Abdominal: Soft. She exhibits no distension. There is no tenderness. There is no guarding.  Musculoskeletal:  Wound left medial thigh with wound VAC in place, without associated swelling or fluctuance in the region.  Mild left distal radius region swelling with reduced motion left wrist secondary to pain.  Neurological: She is alert and oriented to person, place, and time. She exhibits normal muscle tone.  Skin: Skin is warm and dry.  Psychiatric: She has a normal mood and affect. Her behavior is normal. Judgment and thought content normal.  Nursing note and vitals reviewed.    ED Treatments / Results  Labs (all labs ordered are listed, but only abnormal results are displayed) Labs Reviewed  BASIC METABOLIC PANEL - Abnormal; Notable for the following components:      Result Value   Glucose, Bld 187 (*)    BUN 70 (*)    Creatinine, Ser 7.44 (*)    GFR calc non Af Amer 5 (*)    GFR calc Af Amer 5 (*)    All other components within normal limits  CBC - Abnormal; Notable for the following components:   RBC 3.86 (*)    Hemoglobin 10.4 (*)    HCT 32.5 (*)    RDW 16.1 (*)    All other components within normal limits  URINALYSIS, ROUTINE W REFLEX MICROSCOPIC  CBG MONITORING, ED    EKG  EKG Interpretation  Date/Time:  Thursday June 15 2017 13:38:24 EST Ventricular Rate:  64 PR Interval:    QRS Duration: 84 QT Interval:  388 QTC Calculation: 401 R  Axis:  21 Text Interpretation:  Sinus rhythm Low voltage, precordial leads since last tracing no significant change Confirmed by Daleen Bo [phone]) on 06/15/2017 2:40:29 PM       Radiology Dg Wrist Complete Left  Result Date: 06/15/2017 CLINICAL DATA:  Pain and swelling following fall EXAM: LEFT WRIST - COMPLETE 3+ VIEW COMPARISON:  April 22, 2017 FINDINGS: Frontal, oblique, lateral, and ulnar deviation scaphoid images were obtained. There is a comminuted fracture of the distal radius involving the metaphysis along the lateral aspect and extending into the radiocarpal joint. There is slight volar angulation distally. There is mild lateral displacement of the radial styloid compared to the remainder of the radius. There is a small avulsion along the medial most aspect of the distal radial articular surface. No other fractures are evident. No dislocation. Joint spaces appear intact. No erosive change. IMPRESSION: Comminuted fracture distal radius with mild displacement of fracture fragments. No other fractures are evident. No dislocation. No appreciable arthropathy. Electronically Signed   By: Lowella Grip III M.D.   On: 06/15/2017 17:02    Procedures Procedures (including critical care time)  Medications Ordered in ED Medications  acetaminophen (TYLENOL) tablet 650 mg (650 mg Oral Given 06/15/17 1532)     Initial Impression / Assessment and Plan / ED Course  I have reviewed the triage vital signs and the nursing notes.  Pertinent labs & imaging results that were available during my care of the patient were reviewed by me and considered in my medical decision making (see chart for details).  Clinical Course as of Jun 16 1719  Thu Jun 15, 2017  1550 Patient was seen by the nephrology PA who feels like her left thigh graft is usable for dialysis.  The PA contacted her dialysis unit who can dialyze her tomorrow.  The patient is deemed stable for discharge and treatment with  dialysis tomorrow, by the PA.  [EW]    Clinical Course User Index [EW] Daleen Bo, MD     Patient Vitals for the past 24 hrs:  BP Temp Temp src Pulse Resp SpO2  06/15/17 1515 (!) 144/74 - - (!) 58 - 93 %  06/15/17 1430 - - - 64 - 97 %  06/15/17 1415 - - - 73 - 100 %  06/15/17 1345 (!) 142/47 - - 64 20 100 %  06/15/17 1339 (!) 146/45 97.6 F (36.4 C) Oral 64 15 100 %  06/15/17 1331 - - - - - 99 %    4:07 PM Reevaluation with update and discussion. After initial assessment and treatment, an updated evaluation reveals she is comfortable, now. Patient updated on findings and plan. Daleen Bo      Final Clinical Impressions(s) / ED Diagnoses   Final diagnoses:  Pain of left lower extremity  Closed fracture of distal end of left radius, unspecified fracture morphology, initial encounter   Nonspecific left leg discomfort with difficulty obtaining dialysis yesterday.  Potassium is normal today.  No significant fluid overload or need for immediate dialysis.  Contusion left wrist is suspected based on clinical exam.  Imaging ordered.  Anticipate discharge after imaging.  Left leg DVT, on Coumadin, unable to afford Lovenox for bridging.  Nursing Notes Reviewed/ Care Coordinated Applicable Imaging Reviewed Interpretation of Laboratory Data incorporated into ED treatment  The patient appears reasonably screened and/or stabilized for discharge and I doubt any other medical condition or other [facility] requiring further screening, evaluation, or treatment in the ED at this time prior to discharge.  Plan:  Home Medications-continue current medications; Home Treatments-rest, heat to affected area; return here if the recommended treatment, does not improve the symptoms; Recommended follow up-PCP, as needed. Orthopedic follow-up 1 week regarding wrist fracture.    ED Discharge Orders        Ordered    traMADol (ULTRAM) 50 MG tablet  Every 6 hours PRN     06/15/17 1634       Daleen Bo, MD 06/15/17 1637   Daleen Bo, MD 06/15/17 1722

## 2017-06-15 NOTE — ED Notes (Signed)
IV team paged to collect blood from patient's port-a-cath.

## 2017-06-15 NOTE — Progress Notes (Signed)
Orthopedic Tech Progress Note Patient Details:  Ashlee Miles 02/22/1941 [id number]  Ortho Devices Type of Ortho Device: Ace wrap, Volar splint Ortho Device/Splint Location: LUE Ortho Device/Splint Interventions: Ordered, Application   Post Interventions Patient Tolerated: Well Instructions Provided: Care of device   Braulio Bosch 06/15/2017, 5:45 PM

## 2017-06-15 NOTE — Discharge Instructions (Addendum)
Go to dialysis tomorrow morning as scheduled. 

## 2017-06-15 NOTE — ED Triage Notes (Signed)
Per [facility]: Pt to ED from home c/o generalized weakness and pain all over, especially in her L leg. Pt reports recent blood clots that led to needing a wound vac in L leg. She is also a dialysis patient M/W/F - last went on Friday, skipped Monday, and dialysis staff unable to access yesterday d/t increased swelling around the site (L leg). Patient denies fevers/chills. She adds that she fell onto her L wrist - some swelling noted. Peripheral edema all extremities. Resp e/u, skin warm/dry.

## 2017-06-16 ENCOUNTER — Telehealth: Payer: Self-pay

## 2017-06-16 NOTE — Telephone Encounter (Signed)
Home Health nurse from [facility] called questioning the management of the patient's INR. She was instructed to call the PCP for orders. She also asked about the management of the wound vac and inquired on patient's next follow up appointment. Order was given to continue wound care/wound vac and appointment was made for 1/7 at 8:30 am with Dr. Trula Slade.

## 2017-06-19 ENCOUNTER — Encounter (HOSPITAL_COMMUNITY): Payer: Self-pay | Admitting: Surgery

## 2017-06-21 ENCOUNTER — Other Ambulatory Visit (HOSPITAL_COMMUNITY): Payer: Self-pay | Admitting: Nephrology

## 2017-06-21 DIAGNOSIS — N186 End stage renal disease: Secondary | ICD-10-CM

## 2017-06-22 ENCOUNTER — Ambulatory Visit (HOSPITAL_COMMUNITY)
Admission: RE | Admit: 2017-06-22 | Discharge: 2017-06-22 | Disposition: A | Discharge status: Home / Self Care | Payer: 59 | Source: Ambulatory Visit | Attending: Nephrology | Admitting: Nephrology

## 2017-06-22 ENCOUNTER — Other Ambulatory Visit: Payer: Self-pay

## 2017-06-22 ENCOUNTER — Other Ambulatory Visit (HOSPITAL_COMMUNITY): Payer: Self-pay | Admitting: Nephrology

## 2017-06-22 ENCOUNTER — Ambulatory Visit: Payer: 59 | Admitting: Vascular Surgery

## 2017-06-22 ENCOUNTER — Ambulatory Visit (INDEPENDENT_AMBULATORY_CARE_PROVIDER_SITE_OTHER): Payer: Self-pay | Admitting: Surgery

## 2017-06-22 ENCOUNTER — Emergency Department (HOSPITAL_COMMUNITY): Payer: 59

## 2017-06-22 ENCOUNTER — Observation Stay (HOSPITAL_COMMUNITY)
Admission: EM | Admit: 2017-06-22 | Discharge: 2017-06-24 | Disposition: A | Discharge status: Home / Self Care | Payer: 59 | Attending: Internal Medicine | Admitting: Internal Medicine

## 2017-06-22 ENCOUNTER — Encounter (HOSPITAL_COMMUNITY): Payer: Self-pay | Admitting: Diagnostic Radiology

## 2017-06-22 DIAGNOSIS — Y832 Surgical operation with anastomosis, bypass or graft as the cause of abnormal reaction of the patient, or of later complication, without mention of misadventure at the time of the procedure: Secondary | ICD-10-CM | POA: Insufficient documentation

## 2017-06-22 DIAGNOSIS — R079 Chest pain, unspecified: Secondary | ICD-10-CM

## 2017-06-22 DIAGNOSIS — Z86718 Personal history of other venous thrombosis and embolism: Secondary | ICD-10-CM | POA: Diagnosis not present

## 2017-06-22 DIAGNOSIS — N189 Chronic kidney disease, unspecified: Secondary | ICD-10-CM | POA: Diagnosis present

## 2017-06-22 DIAGNOSIS — I5032 Chronic diastolic (congestive) heart failure: Secondary | ICD-10-CM | POA: Insufficient documentation

## 2017-06-22 DIAGNOSIS — R778 Other specified abnormalities of plasma proteins: Secondary | ICD-10-CM

## 2017-06-22 DIAGNOSIS — E785 Hyperlipidemia, unspecified: Secondary | ICD-10-CM | POA: Diagnosis not present

## 2017-06-22 DIAGNOSIS — N186 End stage renal disease: Secondary | ICD-10-CM

## 2017-06-22 DIAGNOSIS — D631 Anemia in chronic kidney disease: Secondary | ICD-10-CM | POA: Diagnosis not present

## 2017-06-22 DIAGNOSIS — G319 Degenerative disease of nervous system, unspecified: Secondary | ICD-10-CM | POA: Diagnosis not present

## 2017-06-22 DIAGNOSIS — R1310 Dysphagia, unspecified: Secondary | ICD-10-CM | POA: Insufficient documentation

## 2017-06-22 DIAGNOSIS — I6529 Occlusion and stenosis of unspecified carotid artery: Secondary | ICD-10-CM | POA: Diagnosis not present

## 2017-06-22 DIAGNOSIS — K219 Gastro-esophageal reflux disease without esophagitis: Secondary | ICD-10-CM | POA: Diagnosis not present

## 2017-06-22 DIAGNOSIS — R748 Abnormal levels of other serum enzymes: Secondary | ICD-10-CM | POA: Diagnosis not present

## 2017-06-22 DIAGNOSIS — Z7901 Long term (current) use of anticoagulants: Secondary | ICD-10-CM | POA: Insufficient documentation

## 2017-06-22 DIAGNOSIS — I10 Essential (primary) hypertension: Secondary | ICD-10-CM | POA: Diagnosis present

## 2017-06-22 DIAGNOSIS — F329 Major depressive disorder, single episode, unspecified: Secondary | ICD-10-CM | POA: Insufficient documentation

## 2017-06-22 DIAGNOSIS — Z794 Long term (current) use of insulin: Secondary | ICD-10-CM | POA: Insufficient documentation

## 2017-06-22 DIAGNOSIS — T82868A Thrombosis of vascular prosthetic devices, implants and grafts, initial encounter: Secondary | ICD-10-CM | POA: Insufficient documentation

## 2017-06-22 DIAGNOSIS — Z8249 Family history of ischemic heart disease and other diseases of the circulatory system: Secondary | ICD-10-CM | POA: Diagnosis not present

## 2017-06-22 DIAGNOSIS — D6859 Other primary thrombophilia: Secondary | ICD-10-CM | POA: Insufficient documentation

## 2017-06-22 DIAGNOSIS — Z992 Dependence on renal dialysis: Secondary | ICD-10-CM | POA: Diagnosis not present

## 2017-06-22 DIAGNOSIS — E1122 Type 2 diabetes mellitus with diabetic chronic kidney disease: Secondary | ICD-10-CM | POA: Insufficient documentation

## 2017-06-22 DIAGNOSIS — Z79899 Other long term (current) drug therapy: Secondary | ICD-10-CM | POA: Diagnosis not present

## 2017-06-22 DIAGNOSIS — I132 Hypertensive heart and chronic kidney disease with heart failure and with stage 5 chronic kidney disease, or end stage renal disease: Secondary | ICD-10-CM | POA: Diagnosis not present

## 2017-06-22 DIAGNOSIS — Z8673 Personal history of transient ischemic attack (TIA), and cerebral infarction without residual deficits: Secondary | ICD-10-CM | POA: Insufficient documentation

## 2017-06-22 DIAGNOSIS — R0789 Other chest pain: Secondary | ICD-10-CM | POA: Diagnosis not present

## 2017-06-22 DIAGNOSIS — R7989 Other specified abnormal findings of blood chemistry: Secondary | ICD-10-CM

## 2017-06-22 HISTORY — PX: IR THROMBECTOMY AV FISTULA W/THROMBOLYSIS/PTA INC/SHUNT/IMG LEFT: IMG6106

## 2017-06-22 HISTORY — PX: IR US GUIDE VASC ACCESS LEFT: IMG2389

## 2017-06-22 LAB — BASIC METABOLIC PANEL
Anion gap: 12 (ref 5–15)
BUN: 61 mg/dL — ABNORMAL HIGH (ref 6–20)
CO2: 25 mmol/L (ref 22–32)
Calcium: 7.9 mg/dL — ABNORMAL LOW (ref 8.9–10.3)
Chloride: 100 mmol/L — ABNORMAL LOW (ref 101–111)
Creatinine, Ser: 8.52 mg/dL — ABNORMAL HIGH (ref 0.44–1.00)
GFR calc Af Amer: 5 mL/min — ABNORMAL LOW (ref >60)
GFR calc non Af Amer: 4 mL/min — ABNORMAL LOW (ref >60)
Glucose, Bld: 197 mg/dL — ABNORMAL HIGH (ref 65–99)
Potassium: 4 mmol/L (ref 3.5–5.1)
Sodium: 137 mmol/L (ref 135–145)

## 2017-06-22 LAB — CBC
HCT: 34.7 % — ABNORMAL LOW (ref 36.0–46.0)
Hemoglobin: 11.2 g/dL — ABNORMAL LOW (ref 12.0–15.0)
MCH: 27.5 pg (ref 26.0–34.0)
MCHC: 32.3 g/dL (ref 30.0–36.0)
MCV: 85.3 fL (ref 78.0–100.0)
Platelets: 237 10*3/uL (ref 150–400)
RBC: 4.07 MIL/uL (ref 3.87–5.11)
RDW: 18.1 % — ABNORMAL HIGH (ref 11.5–15.5)
WBC: 9.9 10*3/uL (ref 4.0–10.5)

## 2017-06-22 LAB — HEPATIC FUNCTION PANEL
ALT: 7 U/L — ABNORMAL LOW (ref 14–54)
AST: 15 U/L (ref 15–41)
Albumin: 3 g/dL — ABNORMAL LOW (ref 3.5–5.0)
Alkaline Phosphatase: 65 U/L (ref 38–126)
Bilirubin, Direct: <0.1 mg/dL — ABNORMAL LOW (ref 0.1–0.5)
Total Bilirubin: 0.5 mg/dL (ref 0.3–1.2)
Total Protein: 6.3 g/dL — ABNORMAL LOW (ref 6.5–8.1)

## 2017-06-22 LAB — I-STAT TROPONIN, ED
Troponin i, poc: 0.15 ng/mL (ref 0.00–0.08)
Troponin i, poc: 0.42 ng/mL (ref 0.00–0.08)

## 2017-06-22 LAB — PROTIME-INR
INR: 2.39
Prothrombin Time: 25.9 seconds — ABNORMAL HIGH (ref 11.4–15.2)

## 2017-06-22 LAB — LIPASE, BLOOD: Lipase: 28 U/L (ref 11–51)

## 2017-06-22 LAB — GLUCOSE, CAPILLARY: Glucose-Capillary: 190 mg/dL — ABNORMAL HIGH (ref 65–99)

## 2017-06-22 MED ORDER — HYDROCODONE-ACETAMINOPHEN 5-325 MG PO TABS
1.0000 | ORAL_TABLET | ORAL | Status: DC | PRN
  Administered 2017-06-22: 2 via ORAL

## 2017-06-22 MED ORDER — HYDROCODONE-ACETAMINOPHEN 5-325 MG PO TABS
ORAL_TABLET | ORAL | Status: AC
  Administered 2017-06-22: 2 via ORAL
  Filled 2017-06-22: qty 2

## 2017-06-22 MED ORDER — INSULIN GLARGINE 100 UNIT/ML ~~LOC~~ SOLN
3.0000 [IU] | Freq: Every day | SUBCUTANEOUS | Status: DC
  Administered 2017-06-23 (×2): 3 [IU] via SUBCUTANEOUS
  Filled 2017-06-22 (×4): qty 0.03

## 2017-06-22 MED ORDER — MIDAZOLAM HCL 2 MG/2ML IJ SOLN
INTRAMUSCULAR | Status: AC | PRN
  Administered 2017-06-22 (×6): 1 mg via INTRAVENOUS

## 2017-06-22 MED ORDER — LIDOCAINE HCL 1 % IJ SOLN
INTRAMUSCULAR | Status: AC
  Filled 2017-06-22: qty 20

## 2017-06-22 MED ORDER — CARVEDILOL 6.25 MG PO TABS
6.2500 mg | ORAL_TABLET | Freq: Two times a day (BID) | ORAL | Status: DC
  Administered 2017-06-23 – 2017-06-24 (×3): 6.25 mg via ORAL
  Filled 2017-06-22 (×3): qty 1

## 2017-06-22 MED ORDER — FENTANYL CITRATE (PF) 100 MCG/2ML IJ SOLN
INTRAMUSCULAR | Status: AC | PRN
  Administered 2017-06-22 (×4): 50 ug via INTRAVENOUS

## 2017-06-22 MED ORDER — ENOXAPARIN SODIUM 100 MG/ML ~~LOC~~ SOLN: 100.0000 mg | SUBCUTANEOUS | Status: DC

## 2017-06-22 MED ORDER — ONDANSETRON HCL 4 MG/2ML IJ SOLN
4.0000 mg | Freq: Once | INTRAMUSCULAR | Status: AC
  Administered 2017-06-22: 4 mg via INTRAVENOUS
  Filled 2017-06-22: qty 2

## 2017-06-22 MED ORDER — IOPAMIDOL (ISOVUE-300) INJECTION 61%
INTRAVENOUS | Status: AC
  Administered 2017-06-22: 30 mL
  Filled 2017-06-22: qty 50

## 2017-06-22 MED ORDER — ALTEPLASE 2 MG IJ SOLR
INTRAMUSCULAR | Status: AC | PRN
  Administered 2017-06-22: 2 mg

## 2017-06-22 MED ORDER — LIDOCAINE HCL (PF) 1 % IJ SOLN
INTRAMUSCULAR | Status: AC | PRN
  Administered 2017-06-22: 15 mL

## 2017-06-22 MED ORDER — SIMVASTATIN 40 MG PO TABS
40.0000 mg | ORAL_TABLET | Freq: Every day | ORAL | Status: DC
  Administered 2017-06-23 – 2017-06-24 (×2): 40 mg via ORAL
  Filled 2017-06-22 (×3): qty 1

## 2017-06-22 MED ORDER — CARVEDILOL 3.125 MG PO TABS
ORAL_TABLET | ORAL | Status: AC
  Administered 2017-06-22: 6.25 mg via ORAL
  Filled 2017-06-22: qty 2

## 2017-06-22 MED ORDER — ONDANSETRON HCL 4 MG/2ML IJ SOLN: 4.0000 mg | Freq: Four times a day (QID) | INTRAMUSCULAR | Status: DC | PRN

## 2017-06-22 MED ORDER — CARVEDILOL 6.25 MG PO TABS
6.2500 mg | ORAL_TABLET | Freq: Once | ORAL | Status: AC
  Administered 2017-06-22: 6.25 mg via ORAL
  Filled 2017-06-22: qty 1

## 2017-06-22 MED ORDER — ALTEPLASE 2 MG IJ SOLR
INTRAMUSCULAR | Status: AC
  Filled 2017-06-22: qty 2

## 2017-06-22 MED ORDER — FENTANYL CITRATE (PF) 100 MCG/2ML IJ SOLN
INTRAMUSCULAR | Status: AC
  Filled 2017-06-22: qty 4

## 2017-06-22 MED ORDER — SUCRALFATE 1 GM/10ML PO SUSP
1.0000 g | Freq: Three times a day (TID) | ORAL | Status: DC
  Administered 2017-06-23 – 2017-06-24 (×5): 1 g via ORAL
  Filled 2017-06-22 (×9): qty 10

## 2017-06-22 MED ORDER — MIDAZOLAM HCL 2 MG/2ML IJ SOLN
INTRAMUSCULAR | Status: AC
  Filled 2017-06-22: qty 6

## 2017-06-22 MED ORDER — IOPAMIDOL (ISOVUE-300) INJECTION 61%
INTRAVENOUS | Status: AC
  Administered 2017-06-22: 30 mL
  Filled 2017-06-22: qty 100

## 2017-06-22 MED ORDER — ACETAMINOPHEN 325 MG PO TABS
650.0000 mg | ORAL_TABLET | ORAL | Status: DC | PRN
  Administered 2017-06-24: 650 mg via ORAL
  Filled 2017-06-22: qty 2

## 2017-06-22 NOTE — ED Notes (Signed)
Patient transported to CT 

## 2017-06-22 NOTE — H&P (Signed)
Chief Complaint: Patient was seen in consultation today for clotted (L)thigh AVG at the request of [place]  Referring Physician(s): [place]  Supervising Physician: Markus Daft  Patient Status: [facility] - Out-pt  History of Present Illness: Ashlee Miles is a 77 y.o. female with ESRD. She has a left thigh AVG and recently had some trouble with it, developing a pseudoaneurysm and hematoma. She had to go to the OR with Dr. Trula Slade for intervention and evacuation of the hematoma. She has had a wound vac to the open left medial thigh wound for the past few weeks. Her HD via the AVG has otherwise been going well. Last HD was on 12/30. She went in for HD yesterday and AVG was found to be thrombosed. She is referred for declot procedure. Has been NPO Family at bedside PMHx, meds, labs, prior imaging reviewed. Takes Coumadin daily.  Past Medical History:  Diagnosis Date  . CHF (congestive heart failure) ([place])   . Diabetes mellitus without complication ([place])   . ESRD (end stage renal disease) on dialysis ([place])   . History of pituitary adenoma   . Hypertension   . Protein C deficiency [facility])     Past Surgical History:  Procedure Laterality Date  . AV FISTULA PLACEMENT Left 03/16/2017   Procedure: INSERTION OF ARTERIOVENOUS (AV) GORE-TEX GRAFT THIGH-LEFT;  Surgeon: Angelia Mould, MD;  Location: [place];  Service: Vascular;  Laterality: Left;  . CHOLECYSTECTOMY    . DIALYSIS FISTULA CREATION     clotted off  . FALSE ANEURYSM REPAIR Left 05/10/2017   Procedure: REPAIR FALSE ANEURYSM left thigh AVGG using Gore 47mmx5cm Viabahn Endoprosthesis;  Surgeon: Serafina Mitchell, MD;  Location: [facility] OR;  Service: Vascular;  Laterality: Left;  . INSERTION OF DIALYSIS CATHETER Right 03/16/2017   Procedure: INSERTION OF RIGHT FEMORAL TUNNELED DIALYSIS CATHETER;  Surgeon: Angelia Mould, MD;  Location: [place];  Service: Vascular;  Laterality: Right;  . IVC FILTER INSERTION    .  PORTACATH PLACEMENT    . THROMBECTOMY AND REVISION OF ARTERIOVENTOUS (AV) GORETEX  GRAFT Left 06/02/2017   Procedure: EVACUATION OF LEFT THIGH HEMATOMA  AND APPLICATION OF WOUND VAC;  Surgeon: Serafina Mitchell, MD;  Location: [place];  Service: Vascular;  Laterality: Left;    Allergies: Patient has no known allergies.  Medications: Prior to Admission medications   Medication Sig Start Date End Date Taking? Authorizing Provider  acetaminophen (TYLENOL) 325 MG tablet Take 650 mg by mouth every 6 (six) hours as needed for mild pain.    [provider]  Amino Acids-Protein Hydrolys (FEEDING SUPPLEMENT, PRO-STAT SUGAR FREE 64,) LIQD Take 30 mLs by mouth 2 (two) times daily. 04/12/17   Sheikh, Georgina Quint Latif, DO  camphor-menthol [facility]) lotion Apply 1 application topically every 8 (eight) hours as needed for itching. 04/12/17   Sheikh, Omair Latif, DO  carvedilol (COREG) 6.25 MG tablet Take 1 tablet (6.25 mg total) by mouth 2 (two) times daily. 03/04/17 03/04/18  Hosie Poisson, MD  docusate sodium (COLACE) 100 MG capsule Take 1 capsule (100 mg total) by mouth 2 (two) times daily. 04/12/17   Sheikh, Omair Latif, DO  enoxaparin (LOVENOX) 100 MG/ML injection Inject 1 mL (100 mg total) into the skin daily. 06/12/17   Drenda Freeze, MD  famotidine (PEPCID) 20 MG tablet Take 1 tablet (20 mg total) daily by mouth. 04/28/17   Lavina Hamman, MD  gabapentin (NEURONTIN) 300 MG capsule Take 1 capsule (300 mg total) by mouth at  bedtime. Patient taking differently: Take 300 mg by mouth 2 (two) times daily.  06/09/17   Dessa Phi, DO  guaiFENesin (MUCINEX) 600 MG 12 hr tablet Take 600 mg by mouth 2 (two) times daily as needed.    [provider]  hydrOXYzine (ATARAX/VISTARIL) 25 MG tablet Take 1 tablet (25 mg total) by mouth every 8 (eight) hours as needed for itching. 04/12/17   Sheikh, Omair Latif, DO  insulin aspart (NOVOLOG) 100 UNIT/ML injection Inject 0-9 Units into the skin 3 (three)  times daily with meals. 03/22/17   Debbe Odea, MD  insulin glargine (LANTUS) 100 UNIT/ML injection Inject 0.03 mLs (3 Units total) into the skin at bedtime. 06/09/17   Dessa Phi, DO  ipratropium-albuterol (DUONEB) 0.5-2.5 (3) MG/3ML SOLN Take 3 mLs by nebulization every 4 (four) hours as needed. 04/12/17   Raiford Noble Latif, DO  linaclotide (LINZESS) 145 MCG CAPS capsule Take 145 mcg by mouth daily as needed.     [provider]  methocarbamol (ROBAXIN) 500 MG tablet Take 1 tablet (500 mg total) every 8 (eight) hours as needed by mouth for muscle spasms. 04/28/17   Lavina Hamman, MD  midodrine (PROAMATINE) 10 MG tablet Take 1 tablet (10 mg total) by mouth every Monday, Wednesday, and Friday with hemodialysis. 04/14/17   Raiford Noble Latif, DO  multivitamin (RENA-VIT) TABS tablet Take 1 tablet by mouth at bedtime. 04/12/17   Sheikh, Omair Latif, DO  pantoprazole (PROTONIX) 40 MG tablet Take 1 tablet (40 mg total) 2 (two) times daily before a meal by mouth. 04/28/17   Lavina Hamman, MD  PARoxetine (PAXIL) 10 MG tablet Take 1 tablet (10 mg total) daily by mouth. 04/28/17   Lavina Hamman, MD  polyethylene glycol [facility] Floria Raveling) packet Take 17 g daily by mouth. 04/28/17   Lavina Hamman, MD  ranitidine (ZANTAC) 150 MG tablet Take 150 mg by mouth 2 (two) times daily.    [provider]  simvastatin (ZOCOR) 40 MG tablet Take 40 mg by mouth daily at 6 PM.    [provider]  sucralfate (CARAFATE) 1 GM/10ML suspension Take 10 mLs (1 g total) 4 (four) times daily -  with meals and at bedtime by mouth. 04/28/17   Lavina Hamman, MD  thiamine (VITAMIN B-1) 100 MG tablet Take 1 tablet (100 mg total) by mouth daily. 06/09/17   Dessa Phi, DO  traMADol (ULTRAM) 50 MG tablet Take 1 tablet (50 mg total) by mouth every 6 (six) hours as needed. 06/15/17   Daleen Bo, MD  triamcinolone cream (KENALOG) 0.1 % Apply 1 application topically every 12 (twelve) hours as  needed.     [provider]  warfarin (COUMADIN) 5 MG tablet 5mg  M/W/F and 2.5mg  Tu/Th/Sa/Su Patient taking differently: Take 5 mg by mouth one time only at 6 PM. 5mg  M/W/F and 2.5mg  Tu/Th/Sa/Su 06/09/17   Dessa Phi, DO     Family History  Problem Relation Age of Onset  . Breast cancer Mother   . Diabetes Sister   . Diabetes Brother   . CAD Other   . Stroke Neg Hx     Social History   Socioeconomic History  . Marital status: Married    Spouse name: Not on file  . Number of children: Not on file  . Years of education: Not on file  . Highest education level: Not on file  Social Needs  . Financial resource strain: Not on file  . Food insecurity -  worry: Not on file  . Food insecurity - inability: Not on file  . Transportation needs - medical: Not on file  . Transportation needs - non-medical: Not on file  Occupational History  . Occupation: retired  Tobacco Use  . Smoking status: Never Smoker  . Smokeless tobacco: Never Used  Substance and Sexual Activity  . Alcohol use: No  . Drug use: No  . Sexual activity: Not on file  Other Topics Concern  . Not on file  Social History Narrative  . Not on file     Review of Systems: A 12 point ROS discussed and pertinent positives are indicated in the HPI above.  All other systems are negative.  Review of Systems  Vital Signs: BP (!) 157/96 (BP Location: Right Arm)   Pulse 73   Temp 97.8 F (36.6 C) (Oral)   Ht 5\' 2"  (1.575 m)   Wt 211 lb (95.7 kg)   SpO2 100%   BMI 38.59 kg/m   Physical Exam  Constitutional: She is oriented to person, place, and time. She appears well-developed. No distress.  HENT:  Head: Normocephalic.  Mouth/Throat: Oropharynx is clear and moist.  Neck: No JVD present. No tracheal deviation present.  Cardiovascular: Normal rate, regular rhythm and normal heart sounds.  Pulmonary/Chest: Effort normal and breath sounds normal. No respiratory distress.  Neurological: She is alert and  oriented to person, place, and time.  Skin:  (L)thigh loop AVG without papable pulse/thrill Wd Vac to medial thigh wound, intact, no associated erythema or induration Legs/feet warm    Imaging: Dg Wrist Complete Left  Result Date: 06/15/2017 CLINICAL DATA:  Pain and swelling following fall EXAM: LEFT WRIST - COMPLETE 3+ VIEW COMPARISON:  April 22, 2017 FINDINGS: Frontal, oblique, lateral, and ulnar deviation scaphoid images were obtained. There is a comminuted fracture of the distal radius involving the metaphysis along the lateral aspect and extending into the radiocarpal joint. There is slight volar angulation distally. There is mild lateral displacement of the radial styloid compared to the remainder of the radius. There is a small avulsion along the medial most aspect of the distal radial articular surface. No other fractures are evident. No dislocation. Joint spaces appear intact. No erosive change. IMPRESSION: Comminuted fracture distal radius with mild displacement of fracture fragments. No other fractures are evident. No dislocation. No appreciable arthropathy. Electronically Signed   By: Lowella Grip III M.D.   On: 06/15/2017 17:02   Dg Abd 1 View  Result Date: 06/07/2017 CLINICAL DATA:  Abdominal pain and nausea. EXAM: ABDOMEN - 1 VIEW COMPARISON:  None. FINDINGS: The bowel gas pattern is nonobstructive. There is a large volume of stool throughout the colon. IVC filter is noted. No abnormal abdominal calcification or focal bony abnormality. IMPRESSION: No acute finding. Large stool burden throughout the colon. Electronically Signed   By: Inge Rise M.D.   On: 06/07/2017 09:54   Dg Chest Port 1 View  Result Date: 06/02/2017 CLINICAL DATA:  Central line placement. EXAM: PORTABLE CHEST 1 VIEW COMPARISON:  Radiograph 04/25/2017 FINDINGS: New right internal jugular central venous catheter tip in the mid SVC. Unchanged right chest port with tip in the mid SVC. No pneumothorax.  Lower lung volumes from prior exam leading to bronchovascular crowding. No overt pulmonary edema. Unchanged cardiomegaly and mediastinal contours. Mild bibasilar atelectasis. No large pleural effusion. IMPRESSION: 1. New right internal jugular central venous catheter tip in the mid SVC. No pneumothorax. 2. Lower lung volumes with bronchovascular crowding and bibasilar  atelectasis. Electronically Signed   By: Jeb Levering M.D.   On: 06/02/2017 20:02   Ct Head Code Stroke Wo Contrast  Result Date: 06/06/2017 CLINICAL DATA:  Altered mental status after dialysis. The initial code stroke designation was subsequently canceled by the neurologist. EXAM: CT HEAD WITHOUT CONTRAST TECHNIQUE: Contiguous axial images were obtained from the base of the skull through the vertex without intravenous contrast. COMPARISON:  Brain MRI 04/27/2017 FINDINGS: Brain: There is no evidence of acute infarct, intracranial hemorrhage, mass, midline shift, or extra-axial fluid collection. Mild cerebral atrophy is within normal limits for age. There is minimal chronic small vessel ischemic disease for age. Vascular: Calcified atherosclerosis at the skullbase. No hyperdense vessel. Skull: No fracture. Partially visualized sclerosis in the left maxilla, likely dental related. Sinuses/Orbits: Mild left maxillary sinus mucosal thickening. Small left mastoid effusion. Left cataract extraction. Other: None. IMPRESSION: No evidence of acute intracranial abnormality. Electronically Signed   By: Logan Bores M.D.   On: 06/06/2017 07:57    Labs:  CBC: Recent Labs    06/08/17 0200 06/09/17 0425 06/12/17 1930 06/15/17 1458  WBC 5.4 5.4 7.3 7.6  HGB 9.1* 9.5* 11.9* 10.4*  HCT 28.7* 29.5* 36.7 32.5*  PLT 201 241 283 243    COAGS: Recent Labs    03/01/17 2051  05/31/17 0012  06/07/17 0420 06/08/17 0200 06/09/17 0425 06/12/17 1930  INR 1.02   < > 4.00   < > 1.29 1.42 1.49 1.45  APTT 29  --  91*  --   --   --   --   --    < >  = values in this interval not displayed.    BMP: Recent Labs    06/08/17 0200 06/09/17 0425 06/12/17 1930 06/15/17 1458  NA 134* 135 138 136  K 3.2* 4.1 4.1 4.8  CL 99* 102 99* 102  CO2 29 27 27 22   GLUCOSE 194* 109* 198* 187*  BUN 21* 31* 48* 70*  CALCIUM 8.4* 9.2 10.0 8.9  CREATININE 3.64* 5.21* 6.94* 7.44*  GFRNONAA 11* 7* 5* 5*  GFRAA 13* 8* 6* 5*    LIVER FUNCTION TESTS: Recent Labs    05/09/17 1820  05/31/17 1618 06/04/17 1123 06/06/17 0743 06/12/17 1930  BILITOT 0.5  --  0.8  --  0.4 0.6  AST 17  --  17  --  18 21  ALT 9*  --  8*  --  <5* 6*  ALKPHOS 77  --  77  --  69 77  PROT 6.3*  --  6.6  --  6.3* 7.0  ALBUMIN 3.1*   < > 2.9* 2.4* 2.5* 3.0*   < > = values in this interval not displayed.    TUMOR MARKERS: No results for input(s): AFPTM, CEA, CA199, CHROMGRNA in the last 8760 hours.  Assessment and Plan: Clotted (L)thigh loop AVG Discussed thrombolysis/thrombectomy of AV Graft/Fistula, possible angioplasty, possible stent, possible HD catheter placement if necessary. Risks and benefits were discussed with the patient including, but not limited to bleeding, infection, vascular injury or contrast induced renal failure.  This interventional procedure involves the use of X-rays and because of the nature of the planned procedure, it is possible that we will have prolonged use of X-ray fluoroscopy.  Potential radiation risks to you include (but are not limited to) the following: - A slightly elevated risk for cancer  several years later in life. This risk is typically less than 0.5% percent. This risk is low in  comparison to the normal incidence of human cancer, which is 33% for women and 50% for men according to the [place]. - Radiation induced injury can include skin redness, resembling a rash, tissue breakdown / ulcers and hair loss (which can be temporary or permanent).   The likelihood of either of these occurring depends on the  difficulty of the procedure and whether you are sensitive to radiation due to previous procedures, disease, or genetic conditions.   IF your procedure requires a prolonged use of radiation, you will be notified and given written instructions for further action.  It is your responsibility to monitor the irradiated area for the 2 weeks following the procedure and to notify your physician if you are concerned that you have suffered a radiation induced injury.    All of the patient's questions were answered, patient is agreeable to proceed.  Consent signed and in chart.    Thank you for this interesting consult.  I greatly enjoyed meeting Ashlee Miles and look forward to participating in their care.  A copy of this report was sent to the requesting provider on this date.  Electronically Signed: Ascencion Dike, PA-C 06/22/2017, 8:32 AM   I spent a total of 25 minutes in face to face in clinical consultation, greater than 50% of which was counseling/coordinating care for (L)thigh AVG declot

## 2017-06-22 NOTE — ED Notes (Signed)
Main lab to add on Hepatic function panel

## 2017-06-22 NOTE — H&P (Signed)
History and Physical    Ashlee Miles QIO:[id number] DOB: October 15, 1940 DOA: 06/22/2017  PCP: Darlina Rumpf, MD  Patient coming from:  PACU  Chief Complaint:  Chest pain, cough, nausea  HPI: Ashlee Miles is a 77 y.o. female with medical history significant of ESRD on dialysis went to OR today for left thigh graft thrombectomy who was in the PACU and was very nauseas, sscp that did not radiate that was associated with dysphagia.  Pts symptoms have all resolved now during her stay in ED.  Her chest pain lasted for about an hour.  No fevers.  No sob.  She has not had dialysis in 4 days.  No swelling.  Feeling back to normal now.  Her trop came back elevated however and is rising.  Pt referred for admisson for her chest pain.  Cardiology has been called and will see pt in consult.  Review of Systems: As per HPI otherwise 10 point review of systems negative.   Past Medical History:  Diagnosis Date  . CHF (congestive heart failure) ([place])   . Diabetes mellitus without complication ([place])   . ESRD (end stage renal disease) on dialysis ([place])   . History of pituitary adenoma   . Hypertension   . Protein C deficiency [facility])     Past Surgical History:  Procedure Laterality Date  . AV FISTULA PLACEMENT Left 03/16/2017   Procedure: INSERTION OF ARTERIOVENOUS (AV) GORE-TEX GRAFT THIGH-LEFT;  Surgeon: Angelia Mould, MD;  Location: [place];  Service: Vascular;  Laterality: Left;  . CHOLECYSTECTOMY    . DIALYSIS FISTULA CREATION     clotted off  . FALSE ANEURYSM REPAIR Left 05/10/2017   Procedure: REPAIR FALSE ANEURYSM left thigh AVGG using Gore 62mmx5cm Viabahn Endoprosthesis;  Surgeon: Serafina Mitchell, MD;  Location: [facility] OR;  Service: Vascular;  Laterality: Left;  . INSERTION OF DIALYSIS CATHETER Right 03/16/2017   Procedure: INSERTION OF RIGHT FEMORAL TUNNELED DIALYSIS CATHETER;  Surgeon: Angelia Mould, MD;  Location: [facility] OR;  Service: Vascular;  Laterality: Right;  . IR THROMBECTOMY AV  FISTULA W/THROMBOLYSIS/PTA INC/SHUNT/IMG LEFT Left 06/22/2017  . IR US GUIDE VASC ACCESS LEFT  06/22/2017  . IVC FILTER INSERTION    . PORTACATH PLACEMENT    . THROMBECTOMY AND REVISION OF ARTERIOVENTOUS (AV) GORETEX  GRAFT Left 06/02/2017   Procedure: EVACUATION OF LEFT THIGH HEMATOMA  AND APPLICATION OF WOUND VAC;  Surgeon: Serafina Mitchell, MD;  Location: [place];  Service: Vascular;  Laterality: Left;     reports that  has never smoked. she has never used smokeless tobacco. She reports that she does not drink alcohol or use drugs.  No Known Allergies  Family History  Problem Relation Age of Onset  . Breast cancer Mother   . Diabetes Sister   . Diabetes Brother   . CAD Other   . Stroke Neg Hx     Prior to Admission medications   Medication Sig Start Date End Date Taking? Authorizing Provider  acetaminophen (TYLENOL) 325 MG tablet Take 650 mg by mouth every 6 (six) hours as needed for mild pain.    [provider]  Amino Acids-Protein Hydrolys (FEEDING SUPPLEMENT, PRO-STAT SUGAR FREE 64,) LIQD Take 30 mLs by mouth 2 (two) times daily. 04/12/17   Sheikh, Georgina Quint Latif, DO  camphor-menthol [facility]) lotion Apply 1 application topically every 8 (eight) hours as needed for itching. 04/12/17   Raiford Noble Latif, DO  carvedilol (COREG) 6.25 MG tablet Take 1 tablet (6.25 mg total)  by mouth 2 (two) times daily. 03/04/17 03/04/18  Hosie Poisson, MD  docusate sodium (COLACE) 100 MG capsule Take 1 capsule (100 mg total) by mouth 2 (two) times daily. 04/12/17   Sheikh, Omair Latif, DO  enoxaparin (LOVENOX) 100 MG/ML injection Inject 1 mL (100 mg total) into the skin daily. 06/12/17   Drenda Freeze, MD  famotidine (PEPCID) 20 MG tablet Take 1 tablet (20 mg total) daily by mouth. 04/28/17   Lavina Hamman, MD  gabapentin (NEURONTIN) 300 MG capsule Take 1 capsule (300 mg total) by mouth at bedtime. Patient taking differently: Take 300 mg by mouth 2 (two) times daily.  06/09/17   Dessa Phi,  DO  guaiFENesin (MUCINEX) 600 MG 12 hr tablet Take 600 mg by mouth 2 (two) times daily as needed.    [provider]  hydrOXYzine (ATARAX/VISTARIL) 25 MG tablet Take 1 tablet (25 mg total) by mouth every 8 (eight) hours as needed for itching. 04/12/17   Sheikh, Omair Latif, DO  insulin aspart (NOVOLOG) 100 UNIT/ML injection Inject 0-9 Units into the skin 3 (three) times daily with meals. 03/22/17   Debbe Odea, MD  insulin glargine (LANTUS) 100 UNIT/ML injection Inject 0.03 mLs (3 Units total) into the skin at bedtime. 06/09/17   Dessa Phi, DO  ipratropium-albuterol (DUONEB) 0.5-2.5 (3) MG/3ML SOLN Take 3 mLs by nebulization every 4 (four) hours as needed. 04/12/17   Raiford Noble Latif, DO  linaclotide (LINZESS) 145 MCG CAPS capsule Take 145 mcg by mouth daily as needed.     [provider]  methocarbamol (ROBAXIN) 500 MG tablet Take 1 tablet (500 mg total) every 8 (eight) hours as needed by mouth for muscle spasms. 04/28/17   Lavina Hamman, MD  midodrine (PROAMATINE) 10 MG tablet Take 1 tablet (10 mg total) by mouth every Monday, Wednesday, and Friday with hemodialysis. 04/14/17   Raiford Noble Latif, DO  multivitamin (RENA-VIT) TABS tablet Take 1 tablet by mouth at bedtime. 04/12/17   Sheikh, Omair Latif, DO  pantoprazole (PROTONIX) 40 MG tablet Take 1 tablet (40 mg total) 2 (two) times daily before a meal by mouth. 04/28/17   Lavina Hamman, MD  PARoxetine (PAXIL) 10 MG tablet Take 1 tablet (10 mg total) daily by mouth. 04/28/17   Lavina Hamman, MD  polyethylene glycol [facility] Floria Raveling) packet Take 17 g daily by mouth. 04/28/17   Lavina Hamman, MD  ranitidine (ZANTAC) 150 MG tablet Take 150 mg by mouth 2 (two) times daily.    [provider]  simvastatin (ZOCOR) 40 MG tablet Take 40 mg by mouth daily at 6 PM.    [provider]  sucralfate (CARAFATE) 1 GM/10ML suspension Take 10 mLs (1 g total) 4 (four) times daily -  with meals and at bedtime by  mouth. 04/28/17   Lavina Hamman, MD  thiamine (VITAMIN B-1) 100 MG tablet Take 1 tablet (100 mg total) by mouth daily. 06/09/17   Dessa Phi, DO  traMADol (ULTRAM) 50 MG tablet Take 1 tablet (50 mg total) by mouth every 6 (six) hours as needed. 06/15/17   Daleen Bo, MD  triamcinolone cream (KENALOG) 0.1 % Apply 1 application topically every 12 (twelve) hours as needed.     [provider]  warfarin (COUMADIN) 5 MG tablet 5mg  M/W/F and 2.5mg  Tu/Th/Sa/Su Patient taking differently: Take 5 mg by mouth one time only at 6 PM. 5mg  M/W/F and 2.5mg  Tu/Th/Sa/Su 06/09/17   Dessa Phi, DO  Physical Exam: Vitals:   06/22/17 1446 06/22/17 1830 06/22/17 2030 06/22/17 2100  BP: (!) 175/73 (!) 154/71 (!) 144/66 127/65  Pulse: 82 72 65   Resp: 16 15 14 14   Temp: 98.2 F (36.8 C)     TempSrc: Oral     SpO2: 99% 99% 97%       Constitutional: NAD, calm, comfortable Vitals:   06/22/17 1446 06/22/17 1830 06/22/17 2030 06/22/17 2100  BP: (!) 175/73 (!) 154/71 (!) 144/66 127/65  Pulse: 82 72 65   Resp: 16 15 14 14   Temp: 98.2 F (36.8 C)     TempSrc: Oral     SpO2: 99% 99% 97%    Eyes: PERRL, lids and conjunctivae normal ENMT: Mucous membranes are moist. Posterior pharynx clear of any exudate or lesions.Normal dentition.  Neck: normal, supple, no masses, no thyromegaly Respiratory: clear to auscultation bilaterally, no wheezing, no crackles. Normal respiratory effort. No accessory muscle use.  Cardiovascular: Regular rate and rhythm, no murmurs / rubs / gallops. No extremity edema. 2+ pedal pulses. No carotid bruits.  Abdomen: no tenderness, no masses palpated. No hepatosplenomegaly. Bowel sounds positive.  Musculoskeletal: no clubbing / cyanosis. No joint deformity upper and lower extremities. Good ROM, no contractures. Normal muscle tone.  Skin: no rashes, lesions, ulcers. No induration Neurologic: CN 2-12 grossly intact. Sensation intact, DTR normal. Strength 5/5 in all  4.  Psychiatric: Normal judgment and insight. Alert and oriented x 3. Normal mood.    Labs on Admission: I have personally reviewed following labs and imaging studies  CBC: Recent Labs  Lab 06/22/17 1600  WBC 9.9  HGB 11.2*  HCT 34.7*  MCV 85.3  PLT 528   Basic Metabolic Panel: Recent Labs  Lab 06/22/17 0835  NA 137  K 4.0  CL 100*  CO2 25  GLUCOSE 197*  BUN 61*  CREATININE 8.52*  CALCIUM 7.9*   GFR: Estimated Creatinine Clearance: 6.1 mL/min (A) (by C-G formula based on SCr of 8.52 mg/dL (H)). Liver Function Tests: Recent Labs  Lab 06/22/17 0837  AST 15  ALT 7*  ALKPHOS 65  BILITOT 0.5  PROT 6.3*  ALBUMIN 3.0*   Recent Labs  Lab 06/22/17 1600  LIPASE 28   No results for input(s): AMMONIA in the last 168 hours. Coagulation Profile: Recent Labs  Lab 06/22/17 0835  INR 2.39   Cardiac Enzymes: No results for input(s): CKTOTAL, CKMB, CKMBINDEX, TROPONINI in the last 168 hours. BNP (last 3 results) No results for input(s): PROBNP in the last 8760 hours. HbA1C: No results for input(s): HGBA1C in the last 72 hours. CBG: Recent Labs  Lab 06/22/17 0756  GLUCAP 190*   Lipid Profile: No results for input(s): CHOL, HDL, LDLCALC, TRIG, CHOLHDL, LDLDIRECT in the last 72 hours. Thyroid Function Tests: No results for input(s): TSH, T4TOTAL, FREET4, T3FREE, THYROIDAB in the last 72 hours. Anemia Panel: No results for input(s): VITAMINB12, FOLATE, FERRITIN, TIBC, IRON, RETICCTPCT in the last 72 hours. Urine analysis:    Component Value Date/Time   COLORURINE AMBER (A) 06/06/2017 1636   APPEARANCEUR CLOUDY (A) 06/06/2017 1636   LABSPEC 1.018 06/06/2017 1636   PHURINE 5.0 06/06/2017 1636   GLUCOSEU NEGATIVE 06/06/2017 1636   HGBUR SMALL (A) 06/06/2017 1636   BILIRUBINUR NEGATIVE 06/06/2017 1636   KETONESUR NEGATIVE 06/06/2017 1636   PROTEINUR 100 (A) 06/06/2017 1636   NITRITE NEGATIVE 06/06/2017 1636   LEUKOCYTESUR NEGATIVE 06/06/2017 1636   Sepsis  Labs: !!!!!!!!!!!!!!!!!!!!!!!!!!!!!!!!!!!!!!!!!!!! @LABRCNTIP (procalcitonin:4,lacticidven:4) )No results found for this  or any previous visit (from the past 240 hour(s)).   Radiological Exams on Admission: Dg Chest 2 View  Result Date: 06/22/2017 CLINICAL DATA:  Chest pain for a while which worsened today. EXAM: CHEST  2 VIEW COMPARISON:  PA and lateral chest 03/01/2017. FINDINGS: There is cardiomegaly without edema. No pneumothorax or pleural effusion. Aortic atherosclerosis is identified. Port-A-Cath and vascular stent in the left upper arm are in place. IMPRESSION: Cardiomegaly without acute disease. Atherosclerosis. Electronically Signed   By: Inge Rise M.D.   On: 06/22/2017 16:26   Ct Head Wo Contrast  Result Date: 06/22/2017 CLINICAL DATA:  PER ED NOTES: Patient transported from Short Stay after a left leg thrombectomy. After completion of procedure patient complained of difficulty swallowing. Patient alert and oriented and in no apparent distress at this time. EXAM: CT HEAD WITHOUT CONTRAST TECHNIQUE: Contiguous axial images were obtained from the base of the skull through the vertex without intravenous contrast. COMPARISON:  06/06/2017 FINDINGS: Brain: Diffuse cerebral atrophy. Mild ventricular dilatation consistent with central atrophy. Low-attenuation in the deep white matter consistent with small vessel ischemia. No mass effect or midline shift. No abnormal extra-axial fluid collections. Gray-white matter junctions are distinct. Basal cisterns are not effaced. No acute intracranial hemorrhage. No significant changes since the prior study. Vascular: Internal carotid artery vascular calcifications are present. Skull: Normal. Negative for fracture or focal lesion. Sinuses/Orbits: No acute finding. Other: None. IMPRESSION: No acute intracranial abnormalities. Chronic atrophy and small vessel ischemic changes similar to prior study. Electronically Signed   By: Lucienne Capers M.D.   On:  06/22/2017 19:44   Ir US Guide Vasc Access Left  Result Date: 06/22/2017 INDICATION: End-stage renal disease and clotted left lower extremity graft. Patient has a wound VAC in left thigh. Patient also had a covered stent in placed in the left thigh AV graft due to a pseudoaneurysm. EXAM: DIALYSIS GRAFT DECLOT GRAFT / VENOUS PTA; ULTRASOUND GUIDANCE FOR VASCULAR ACCESS Physician: Stephan Minister. Anselm Pancoast, MD MEDICATIONS: None. ANESTHESIA/SEDATION: TPA 2 mg, Versed 6.0 mg, Fentanyl 200 mcg Moderate Sedation Time:  70 minutes The patient was continuously monitored during the procedure by the interventional radiology nurse under my direct supervision. FLUOROSCOPY TIME:  Fluoroscopy Time: 13 minutes 24 seconds (172 mGy). COMPLICATIONS: None immediate. PROCEDURE: The procedure was explained to the patient. The risks and benefits of the procedure were discussed and the patient's questions were addressed. Informed consent was obtained from the patient. The left thigh was prepped and draped in a sterile fashion. Maximal barrier sterile technique was utilized including caps, mask, sterile gowns, sterile gloves, sterile drape, hand hygiene and skin antiseptic. The skin was anesthetized with 1% lidocaine. The graft was accessed using 21 gauge needle towards the venous anastomosis with ultrasound. The vascular access pointing towards the central veins was upsized to a 7-French vascular sheath. A 5-French catheter was advanced into the central venous structures and a central venogram was performed. 2 mg of TPA was injected through the 5 French catheter as it was pulled through the graft. Fluoroscopic images were saved for documentation. A Bentson wire was placed centrally and the graft was treated with the AngioJet thrombectomy device. The venous anastomosis and medial limb were angioplastied with a 6 mm x 40 mm balloon. 21 gauge needle was directed into the graft towards the arterial anastomosis with ultrasound guidance. Micropuncture  dilator set was placed and a 6 [place] vascular sheath was placed. A Glidewire and Fogarty balloon were advanced across arterial anastomosis. The arterial plug  was pulled using the 5 [place] Fogarty balloon. There was flow in the graft. The arterial plug was pulled 2 times. Follow-up angiogram demonstrated residual clot at the apex of the graft. The graft was treated a second time with the Angiojet thrombectomy device. Follow-up angiogram demonstrated critical residual stenosis along the medial limb of the graft. This area was treated again with a 6 mm x 40 mm balloon. Follow-up angiogram images demonstrated residual stenosis along the medial limb. The medial limb and venous anastomosis were subsequently treated with a 7 mm x 40 mm Conquest balloon. Follow-up angiogram images were obtained. Five French catheter was advanced across the arterial anastomosis and an angiogram was performed in order to evaluate the anastomosis and lateral limb of the graft. The vascular sheaths were removed with purse string sutures. No immediate complication. FINDINGS: Occlusion of the left thigh loop graft. Patient has a covered stent along the medial aspect of the graft. Long segment stenosis along the medial limb of the graft required multiple angioplasties with 6 mm and 7 mm balloons. The graft is widely patent and flowing well at the end of the procedure. Mild residual stenosis along the medial limb of the graft at the end of the procedure. IMPRESSION: Successful declot of the left lower extremity graft. PLAN: The left thigh graft remains amenable to percutaneous intervention. Electronically Signed   By: Markus Daft M.D.   On: 06/22/2017 14:20   Ir Thrombectomy Av Fistula W/thrombolysis/pta Inc/shunt/img Left  Result Date: 06/22/2017 INDICATION: End-stage renal disease and clotted left lower extremity graft. Patient has a wound VAC in left thigh. Patient also had a covered stent in placed in the left thigh AV graft due to a  pseudoaneurysm. EXAM: DIALYSIS GRAFT DECLOT GRAFT / VENOUS PTA; ULTRASOUND GUIDANCE FOR VASCULAR ACCESS Physician: Stephan Minister. Anselm Pancoast, MD MEDICATIONS: None. ANESTHESIA/SEDATION: TPA 2 mg, Versed 6.0 mg, Fentanyl 200 mcg Moderate Sedation Time:  70 minutes The patient was continuously monitored during the procedure by the interventional radiology nurse under my direct supervision. FLUOROSCOPY TIME:  Fluoroscopy Time: 13 minutes 24 seconds (172 mGy). COMPLICATIONS: None immediate. PROCEDURE: The procedure was explained to the patient. The risks and benefits of the procedure were discussed and the patient's questions were addressed. Informed consent was obtained from the patient. The left thigh was prepped and draped in a sterile fashion. Maximal barrier sterile technique was utilized including caps, mask, sterile gowns, sterile gloves, sterile drape, hand hygiene and skin antiseptic. The skin was anesthetized with 1% lidocaine. The graft was accessed using 21 gauge needle towards the venous anastomosis with ultrasound. The vascular access pointing towards the central veins was upsized to a 7-French vascular sheath. A 5-French catheter was advanced into the central venous structures and a central venogram was performed. 2 mg of TPA was injected through the 5 French catheter as it was pulled through the graft. Fluoroscopic images were saved for documentation. A Bentson wire was placed centrally and the graft was treated with the AngioJet thrombectomy device. The venous anastomosis and medial limb were angioplastied with a 6 mm x 40 mm balloon. 21 gauge needle was directed into the graft towards the arterial anastomosis with ultrasound guidance. Micropuncture dilator set was placed and a 6 [place] vascular sheath was placed. A Glidewire and Fogarty balloon were advanced across arterial anastomosis. The arterial plug was pulled using the 5 [place] Fogarty balloon. There was flow in the graft. The arterial plug was pulled 2 times.  Follow-up angiogram demonstrated residual clot at the  apex of the graft. The graft was treated a second time with the Angiojet thrombectomy device. Follow-up angiogram demonstrated critical residual stenosis along the medial limb of the graft. This area was treated again with a 6 mm x 40 mm balloon. Follow-up angiogram images demonstrated residual stenosis along the medial limb. The medial limb and venous anastomosis were subsequently treated with a 7 mm x 40 mm Conquest balloon. Follow-up angiogram images were obtained. Five French catheter was advanced across the arterial anastomosis and an angiogram was performed in order to evaluate the anastomosis and lateral limb of the graft. The vascular sheaths were removed with purse string sutures. No immediate complication. FINDINGS: Occlusion of the left thigh loop graft. Patient has a covered stent along the medial aspect of the graft. Long segment stenosis along the medial limb of the graft required multiple angioplasties with 6 mm and 7 mm balloons. The graft is widely patent and flowing well at the end of the procedure. Mild residual stenosis along the medial limb of the graft at the end of the procedure. IMPRESSION: Successful declot of the left lower extremity graft. PLAN: The left thigh graft remains amenable to percutaneous intervention. Electronically Signed   By: Markus Daft M.D.   On: 06/22/2017 14:20    EKG: Independently reviewed. nsr no acute issues Case discussed with edp Old chart reviewed  Assessment/Plan 77 yo female with ESRD with chest pain and mild elevation of trop  Principal Problem:   Chest pain in adult- trop from 0.15 to 0.4.  Obtain cardiac echo.  Chest pain free now. Cardiology consult.  Monitor trend, hold off on aggressive anticoagulation, antiplatelets due to procedure today unless further significant rise and leave up to cardiology team otherwise.  Active Problems:   Essential hypertension- stable, cont home meds   Chronic  diastolic CHF (congestive heart failure) ([facility])- stable   Anemia due to chronic kidney disease- stable   ESRD (end stage renal disease) on dialysis ([place])- will need to cont routine dialysis   Diabetes mellitus with end stage renal disease ([place])- ssi     DVT prophylaxis:  scds Code Status:  full Family Communication:  none Disposition Plan:  Per day team Consults called:  cardiology Admission status:  observation   Ashlee Miles A MD [facility]  If 7PM-7AM, please contact night-coverage [email or web address] Password [facility]  06/22/2017, 10:25 PM

## 2017-06-22 NOTE — Procedures (Signed)
  Pre-operative Diagnosis: ESRD and thrombosed left thigh graft       Post-operative Diagnosis: Thrombosed left thigh graft with diffuse stenosis along medial limb of graft   Indications: Thrombosed graft  Procedure: Left thigh declot with balloon angioplasty  Findings: Occluded graft.  Diffuse narrowing along medial limb.  Excellent flow after declot with minimal residual stenosis along medial limb.   Complications: none     EBL: minimal  Plan: Discharge in 1-2 hours.  May use graft for dialysis tomorrow and remove sutures at dialysis center.

## 2017-06-22 NOTE — Sedation Documentation (Signed)
Patient is resting comfortably. 

## 2017-06-22 NOTE — Progress Notes (Signed)
Client continues to complain of "feeling terrible" and PA in and client to go to ER

## 2017-06-22 NOTE — Sedation Documentation (Signed)
Placing dsg to L thigh- will monitor for bleeding

## 2017-06-22 NOTE — ED Notes (Signed)
Patient transported to X-ray 

## 2017-06-22 NOTE — Sedation Documentation (Signed)
Instructed by MD to proceed with prepping pt for case; still awaiting labs

## 2017-06-22 NOTE — Progress Notes (Signed)
Client c/o left thigh pain and left arm pain 10/10 and states  "feels like choking" and spitting up clear liquid; Daphane Shepherd notified and will be in

## 2017-06-22 NOTE — ED Triage Notes (Signed)
Patient transported from Short Stay after a left leg thrombectomy. After completion of procedure patient complained of difficulty swallowing. Patient alert and oriented and in no apparent distress at this time. Patient also has left wrist fracture and woundvac on left thigh.

## 2017-06-22 NOTE — ED Provider Notes (Signed)
[facility] EMERGENCY DEPARTMENT Provider Note   CSN: [id number] Arrival date & time: 06/22/17  1441     History   Chief Complaint Chief Complaint  Patient presents with  . Dysphagia    HPI Ashlee Miles is a 77 y.o. female with history of CHF, DM, ESRD on dialysis Monday Wednesday Friday, and hypertension presents today with chief complaint acute onset, progressively improving chest tightness and shortness of breath.  Earlier today she underwent a left thigh thrombectomy due to occluded/thrombosed left thigh graft with interventional radiology.  The patient tolerated the procedure well initially and the plan was for discharge in 1-2 hours with dialysis tomorrow.  Patient has not had dialysis since Sunday.  She attempted to go to hemodialysis yesterday but the ABG was found to be thrombosed. Per IR documentation she was given TPA 2 mg, Versed 6.0 mg, and Fentanyl 200 mcg for sedation.   Shortly after the procedure, the patient states she began to "feel bad "and experienced chest tightness and substernal chest pain that did not radiate.  She began to "spit up clear liquid ".  She was given her home blood pressure medicine and was observed with interventional radiology but with persisting symptoms she was brought to the ED for further evaluation.  She states she feels much better but endorses ongoing nausea and mild cramping epigastric abdominal pain.  She denies fevers or chills.  She is also complaining of ongoing pain in her left thigh and left forearm (she has a known comminuted distal radius fracture).  Denies any new headaches, numbness, tingling, or weakness.  At this time she denies any swelling of the lips or tongue, rashes, or difficulty swallowing.  She denies drooling.  The history is provided by the patient.    Past Medical History:  Diagnosis Date  . CHF (congestive heart failure) ([place])   . Diabetes mellitus without complication ([place])   . ESRD (end stage renal  disease) on dialysis ([place])   . History of pituitary adenoma   . Hypertension   . Protein C deficiency [facility])     Patient Active Problem List   Diagnosis Date Noted  . Altered mental status   . Diabetes mellitus with end stage renal disease ([place]) 05/31/2017  . Depression 05/31/2017  . GERD (gastroesophageal reflux disease) 05/31/2017  . Left groin pain 05/31/2017  . Protein C deficiency ([place])   . Left leg swelling 05/09/2017  . 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 Falls 04/25/2017  . Hypotension 04/25/2017  . Epigastric pain 04/25/2017  . Constipation 04/25/2017  . ESRD (end stage renal disease) on dialysis ([place])   . ESRD on dialysis ([place]) 04/06/2017  . Anemia due to chronic kidney disease 04/06/2017  . Thrombocytopenia ([place]) 04/06/2017  . Cough in adult 04/06/2017  . DVT (deep venous thrombosis) ([place]) 04/05/2017  . Pressure injury of skin 03/14/2017  . Acute metabolic encephalopathy 54/65/0354  . Skin lesion of hand 03/12/2017  . TIA (transient ischemic attack) 03/01/2017  . Syncope 03/01/2017  . Diabetes mellitus with complication ([place]) 65/68/1275  . Essential hypertension 03/01/2017  . Chronic diastolic CHF (congestive heart failure) ([place]) 03/01/2017    Past Surgical History:  Procedure Laterality Date  . AV FISTULA PLACEMENT Left 03/16/2017   Procedure: INSERTION OF ARTERIOVENOUS (AV) GORE-TEX GRAFT THIGH-LEFT;  Surgeon: Angelia Mould, MD;  Location: [place];  Service: Vascular;  Laterality: Left;  . CHOLECYSTECTOMY    . DIALYSIS FISTULA CREATION     clotted off  .  FALSE ANEURYSM REPAIR Left 05/10/2017   Procedure: REPAIR FALSE ANEURYSM left thigh AVGG using Gore 36mmx5cm Viabahn Endoprosthesis;  Surgeon: Serafina Mitchell, MD;  Location: [facility] OR;  Service: Vascular;  Laterality: Left;  . INSERTION OF DIALYSIS CATHETER Right 03/16/2017   Procedure: INSERTION OF RIGHT FEMORAL TUNNELED DIALYSIS CATHETER;  Surgeon: Angelia Mould, MD;  Location: [facility] OR;  Service:  Vascular;  Laterality: Right;  . IR THROMBECTOMY AV FISTULA W/THROMBOLYSIS/PTA INC/SHUNT/IMG LEFT Left 06/22/2017  . IR US GUIDE VASC ACCESS LEFT  06/22/2017  . IVC FILTER INSERTION    . PORTACATH PLACEMENT    . THROMBECTOMY AND REVISION OF ARTERIOVENTOUS (AV) GORETEX  GRAFT Left 06/02/2017   Procedure: EVACUATION OF LEFT THIGH HEMATOMA  AND APPLICATION OF WOUND VAC;  Surgeon: Serafina Mitchell, MD;  Location: [facility] OR;  Service: Vascular;  Laterality: Left;    OB History    No data available       Home Medications    Prior to Admission medications   Medication Sig Start Date End Date Taking? Authorizing Provider  acetaminophen (TYLENOL) 325 MG tablet Take 650 mg by mouth every 6 (six) hours as needed for mild pain.    [provider]  Amino Acids-Protein Hydrolys (FEEDING SUPPLEMENT, PRO-STAT SUGAR FREE 64,) LIQD Take 30 mLs by mouth 2 (two) times daily. 04/12/17   Sheikh, Georgina Quint Latif, DO  camphor-menthol [facility]) lotion Apply 1 application topically every 8 (eight) hours as needed for itching. 04/12/17   Sheikh, Omair Latif, DO  carvedilol (COREG) 6.25 MG tablet Take 1 tablet (6.25 mg total) by mouth 2 (two) times daily. 03/04/17 03/04/18  Hosie Poisson, MD  docusate sodium (COLACE) 100 MG capsule Take 1 capsule (100 mg total) by mouth 2 (two) times daily. 04/12/17   Sheikh, Omair Latif, DO  enoxaparin (LOVENOX) 100 MG/ML injection Inject 1 mL (100 mg total) into the skin daily. 06/12/17   Drenda Freeze, MD  famotidine (PEPCID) 20 MG tablet Take 1 tablet (20 mg total) daily by mouth. 04/28/17   Lavina Hamman, MD  gabapentin (NEURONTIN) 300 MG capsule Take 1 capsule (300 mg total) by mouth at bedtime. Patient taking differently: Take 300 mg by mouth 2 (two) times daily.  06/09/17   Dessa Phi, DO  guaiFENesin (MUCINEX) 600 MG 12 hr tablet Take 600 mg by mouth 2 (two) times daily as needed.    [provider]  hydrOXYzine (ATARAX/VISTARIL) 25 MG tablet Take 1 tablet (25  mg total) by mouth every 8 (eight) hours as needed for itching. 04/12/17   Sheikh, Omair Latif, DO  insulin aspart (NOVOLOG) 100 UNIT/ML injection Inject 0-9 Units into the skin 3 (three) times daily with meals. 03/22/17   Debbe Odea, MD  insulin glargine (LANTUS) 100 UNIT/ML injection Inject 0.03 mLs (3 Units total) into the skin at bedtime. 06/09/17   Dessa Phi, DO  ipratropium-albuterol (DUONEB) 0.5-2.5 (3) MG/3ML SOLN Take 3 mLs by nebulization every 4 (four) hours as needed. 04/12/17   Raiford Noble Latif, DO  linaclotide (LINZESS) 145 MCG CAPS capsule Take 145 mcg by mouth daily as needed.     [provider]  methocarbamol (ROBAXIN) 500 MG tablet Take 1 tablet (500 mg total) every 8 (eight) hours as needed by mouth for muscle spasms. 04/28/17   Lavina Hamman, MD  midodrine (PROAMATINE) 10 MG tablet Take 1 tablet (10 mg total) by mouth every Monday, Wednesday, and Friday with hemodialysis. 04/14/17   Kerney Elbe, DO  multivitamin (RENA-VIT) TABS tablet Take 1 tablet by mouth at bedtime. 04/12/17   Sheikh, Omair Latif, DO  pantoprazole (PROTONIX) 40 MG tablet Take 1 tablet (40 mg total) 2 (two) times daily before a meal by mouth. 04/28/17   Lavina Hamman, MD  PARoxetine (PAXIL) 10 MG tablet Take 1 tablet (10 mg total) daily by mouth. 04/28/17   Lavina Hamman, MD  polyethylene glycol [facility] / Floria Raveling) packet Take 17 g daily by mouth. 04/28/17   Lavina Hamman, MD  ranitidine (ZANTAC) 150 MG tablet Take 150 mg by mouth 2 (two) times daily.    [provider]  simvastatin (ZOCOR) 40 MG tablet Take 40 mg by mouth daily at 6 PM.    [provider]  sucralfate (CARAFATE) 1 GM/10ML suspension Take 10 mLs (1 g total) 4 (four) times daily -  with meals and at bedtime by mouth. 04/28/17   Lavina Hamman, MD  thiamine (VITAMIN B-1) 100 MG tablet Take 1 tablet (100 mg total) by mouth daily. 06/09/17   Dessa Phi, DO  traMADol (ULTRAM) 50 MG tablet Take 1  tablet (50 mg total) by mouth every 6 (six) hours as needed. 06/15/17   Daleen Bo, MD  triamcinolone cream (KENALOG) 0.1 % Apply 1 application topically every 12 (twelve) hours as needed.     [provider]  warfarin (COUMADIN) 5 MG tablet 5mg  M/W/F and 2.5mg  Tu/Th/Sa/Su Patient taking differently: Take 5 mg by mouth one time only at 6 PM. 5mg  M/W/F and 2.5mg  Tu/Th/Sa/Su 06/09/17   Dessa Phi, DO    Family History Family History  Problem Relation Age of Onset  . Breast cancer Mother   . Diabetes Sister   . Diabetes Brother   . CAD Other   . Stroke Neg Hx     Social History Social History   Tobacco Use  . Smoking status: Never Smoker  . Smokeless tobacco: Never Used  Substance Use Topics  . Alcohol use: No  . Drug use: No     Allergies   Patient has no known allergies.   Review of Systems Review of Systems  Constitutional: Negative for chills and fever.  Respiratory: Positive for cough and shortness of breath.   Cardiovascular: Positive for chest pain. Negative for palpitations and leg swelling.  Gastrointestinal: Positive for abdominal pain and nausea. Negative for blood in stool, constipation, diarrhea and vomiting.  Musculoskeletal: Positive for arthralgias.  Skin: Positive for wound.  Neurological: Negative for syncope.  All other systems reviewed and are negative.    Physical Exam Updated Vital Signs BP (!) 154/71   Pulse 72   Temp 98.2 F (36.8 C) (Oral)   Resp 15   SpO2 99%   Physical Exam  Constitutional: She appears well-developed and well-nourished. No distress.  HENT:  Head: Normocephalic and atraumatic.  No angioedema, tolerating secretions well, airway appears patent  Eyes: Conjunctivae are normal. Right eye exhibits no discharge. Left eye exhibits no discharge.  Neck: No JVD present. No tracheal deviation present.  Cardiovascular: Normal rate.  Distant heart sounds, 2+ radial and DP/PT pulses bl, negative Homan's bl, no  lower extremity edema.   Pulmonary/Chest: Effort normal and breath sounds normal. No stridor. No respiratory distress. She has no wheezes. She has no rales. She exhibits tenderness.  Equal rise and fall of chest, no increased work of breathing, hypoactive breath sounds, but speaking in full sentences without difficulty.  Diffuse anterior and posterior chest wall tenderness with no deformity,  crepitus, ecchymosis, or paradoxical wall motion  Abdominal: Soft. Bowel sounds are normal. She exhibits no distension and no mass. There is tenderness. There is no guarding.  Tenderness to palpation in the epigastric region, Murphy sign absent, Rovsing's absent, no CVA tenderness  Musculoskeletal: She exhibits no edema.  Left wrist splint in place.  Neurological: She is alert.  Fluent speech with no evidence of dysarthria or aphasia, no facial droop.  Alert and oriented to person and place but not time.  Cranial nerves II through XII tested and intact.  Good grip strength bilaterally.  Sensation intact to soft touch of extremities.  Skin: Skin is warm and dry. No erythema.  Wound vac in place along the left thigh medially, no surrounding erythema, induration, or abnormal drainage.  Left thigh thrombectomy dressing in place, mildly tender to palpation, no evidence of secondary skin infection noted.  Psychiatric: She has a normal mood and affect. Her behavior is normal.  Nursing note and vitals reviewed.    ED Treatments / Results  Labs (all labs ordered are listed, but only abnormal results are displayed) Labs Reviewed  CBC - Abnormal; Notable for the following components:      Result Value   Hemoglobin 11.2 (*)    HCT 34.7 (*)    RDW 18.1 (*)    All other components within normal limits  HEPATIC FUNCTION PANEL - Abnormal; Notable for the following components:   Total Protein 6.3 (*)    Albumin 3.0 (*)    ALT 7 (*)    Bilirubin, Direct <0.1 (*)    All other components within normal limits    I-STAT TROPONIN, ED - Abnormal; Notable for the following components:   Troponin i, poc 0.15 (*)    All other components within normal limits  I-STAT TROPONIN, ED - Abnormal; Notable for the following components:   Troponin i, poc 0.42 (*)    All other components within normal limits  LIPASE, BLOOD    EKG  EKG Interpretation  Date/Time:  Thursday June 22 2017 15:54:59 EST Ventricular Rate:  72 PR Interval:    QRS Duration: 80 QT Interval:  384 QTC Calculation: 421 R Axis:   18 Text Interpretation:  Sinus rhythm Ventricular premature complex Borderline prolonged PR interval Low voltage, precordial leads Baseline wander in lead(s) III V6 Confirmed by Quintella Reichert [phone]) on 06/22/2017 4:20:17 PM       Radiology Dg Chest 2 View  Result Date: 06/22/2017 CLINICAL DATA:  Chest pain for a while which worsened today. EXAM: CHEST  2 VIEW COMPARISON:  PA and lateral chest 03/01/2017. FINDINGS: There is cardiomegaly without edema. No pneumothorax or pleural effusion. Aortic atherosclerosis is identified. Port-A-Cath and vascular stent in the left upper arm are in place. IMPRESSION: Cardiomegaly without acute disease. Atherosclerosis. Electronically Signed   By: Inge Rise M.D.   On: 06/22/2017 16:26   Ct Head Wo Contrast  Result Date: 06/22/2017 CLINICAL DATA:  PER ED NOTES: Patient transported from Short Stay after a left leg thrombectomy. After completion of procedure patient complained of difficulty swallowing. Patient alert and oriented and in no apparent distress at this time. EXAM: CT HEAD WITHOUT CONTRAST TECHNIQUE: Contiguous axial images were obtained from the base of the skull through the vertex without intravenous contrast. COMPARISON:  06/06/2017 FINDINGS: Brain: Diffuse cerebral atrophy. Mild ventricular dilatation consistent with central atrophy. Low-attenuation in the deep white matter consistent with small vessel ischemia. No mass effect or midline shift. No abnormal  extra-axial fluid  collections. Gray-white matter junctions are distinct. Basal cisterns are not effaced. No acute intracranial hemorrhage. No significant changes since the prior study. Vascular: Internal carotid artery vascular calcifications are present. Skull: Normal. Negative for fracture or focal lesion. Sinuses/Orbits: No acute finding. Other: None. IMPRESSION: No acute intracranial abnormalities. Chronic atrophy and small vessel ischemic changes similar to prior study. Electronically Signed   By: Lucienne Capers M.D.   On: 06/22/2017 19:44   Ir US Guide Vasc Access Left  Result Date: 06/22/2017 INDICATION: End-stage renal disease and clotted left lower extremity graft. Patient has a wound VAC in left thigh. Patient also had a covered stent in placed in the left thigh AV graft due to a pseudoaneurysm. EXAM: DIALYSIS GRAFT DECLOT GRAFT / VENOUS PTA; ULTRASOUND GUIDANCE FOR VASCULAR ACCESS Physician: Stephan Minister. Anselm Pancoast, MD MEDICATIONS: None. ANESTHESIA/SEDATION: TPA 2 mg, Versed 6.0 mg, Fentanyl 200 mcg Moderate Sedation Time:  70 minutes The patient was continuously monitored during the procedure by the interventional radiology nurse under my direct supervision. FLUOROSCOPY TIME:  Fluoroscopy Time: 13 minutes 24 seconds (172 mGy). COMPLICATIONS: None immediate. PROCEDURE: The procedure was explained to the patient. The risks and benefits of the procedure were discussed and the patient's questions were addressed. Informed consent was obtained from the patient. The left thigh was prepped and draped in a sterile fashion. Maximal barrier sterile technique was utilized including caps, mask, sterile gowns, sterile gloves, sterile drape, hand hygiene and skin antiseptic. The skin was anesthetized with 1% lidocaine. The graft was accessed using 21 gauge needle towards the venous anastomosis with ultrasound. The vascular access pointing towards the central veins was upsized to a 7-French vascular sheath. A 5-French  catheter was advanced into the central venous structures and a central venogram was performed. 2 mg of TPA was injected through the 5 French catheter as it was pulled through the graft. Fluoroscopic images were saved for documentation. A Bentson wire was placed centrally and the graft was treated with the AngioJet thrombectomy device. The venous anastomosis and medial limb were angioplastied with a 6 mm x 40 mm balloon. 21 gauge needle was directed into the graft towards the arterial anastomosis with ultrasound guidance. Micropuncture dilator set was placed and a 6 [place] vascular sheath was placed. A Glidewire and Fogarty balloon were advanced across arterial anastomosis. The arterial plug was pulled using the 5 [place] Fogarty balloon. There was flow in the graft. The arterial plug was pulled 2 times. Follow-up angiogram demonstrated residual clot at the apex of the graft. The graft was treated a second time with the Angiojet thrombectomy device. Follow-up angiogram demonstrated critical residual stenosis along the medial limb of the graft. This area was treated again with a 6 mm x 40 mm balloon. Follow-up angiogram images demonstrated residual stenosis along the medial limb. The medial limb and venous anastomosis were subsequently treated with a 7 mm x 40 mm Conquest balloon. Follow-up angiogram images were obtained. Five French catheter was advanced across the arterial anastomosis and an angiogram was performed in order to evaluate the anastomosis and lateral limb of the graft. The vascular sheaths were removed with purse string sutures. No immediate complication. FINDINGS: Occlusion of the left thigh loop graft. Patient has a covered stent along the medial aspect of the graft. Long segment stenosis along the medial limb of the graft required multiple angioplasties with 6 mm and 7 mm balloons. The graft is widely patent and flowing well at the end of the procedure. Mild residual stenosis along the  medial limb of  the graft at the end of the procedure. IMPRESSION: Successful declot of the left lower extremity graft. PLAN: The left thigh graft remains amenable to percutaneous intervention. Electronically Signed   By: Markus Daft M.D.   On: 06/22/2017 14:20   Ir Thrombectomy Av Fistula W/thrombolysis/pta Inc/shunt/img Left  Result Date: 06/22/2017 INDICATION: End-stage renal disease and clotted left lower extremity graft. Patient has a wound VAC in left thigh. Patient also had a covered stent in placed in the left thigh AV graft due to a pseudoaneurysm. EXAM: DIALYSIS GRAFT DECLOT GRAFT / VENOUS PTA; ULTRASOUND GUIDANCE FOR VASCULAR ACCESS Physician: Stephan Minister. Anselm Pancoast, MD MEDICATIONS: None. ANESTHESIA/SEDATION: TPA 2 mg, Versed 6.0 mg, Fentanyl 200 mcg Moderate Sedation Time:  70 minutes The patient was continuously monitored during the procedure by the interventional radiology nurse under my direct supervision. FLUOROSCOPY TIME:  Fluoroscopy Time: 13 minutes 24 seconds (172 mGy). COMPLICATIONS: None immediate. PROCEDURE: The procedure was explained to the patient. The risks and benefits of the procedure were discussed and the patient's questions were addressed. Informed consent was obtained from the patient. The left thigh was prepped and draped in a sterile fashion. Maximal barrier sterile technique was utilized including caps, mask, sterile gowns, sterile gloves, sterile drape, hand hygiene and skin antiseptic. The skin was anesthetized with 1% lidocaine. The graft was accessed using 21 gauge needle towards the venous anastomosis with ultrasound. The vascular access pointing towards the central veins was upsized to a 7-French vascular sheath. A 5-French catheter was advanced into the central venous structures and a central venogram was performed. 2 mg of TPA was injected through the 5 French catheter as it was pulled through the graft. Fluoroscopic images were saved for documentation. A Bentson wire was placed centrally and the  graft was treated with the AngioJet thrombectomy device. The venous anastomosis and medial limb were angioplastied with a 6 mm x 40 mm balloon. 21 gauge needle was directed into the graft towards the arterial anastomosis with ultrasound guidance. Micropuncture dilator set was placed and a 6 [place] vascular sheath was placed. A Glidewire and Fogarty balloon were advanced across arterial anastomosis. The arterial plug was pulled using the 5 [place] Fogarty balloon. There was flow in the graft. The arterial plug was pulled 2 times. Follow-up angiogram demonstrated residual clot at the apex of the graft. The graft was treated a second time with the Angiojet thrombectomy device. Follow-up angiogram demonstrated critical residual stenosis along the medial limb of the graft. This area was treated again with a 6 mm x 40 mm balloon. Follow-up angiogram images demonstrated residual stenosis along the medial limb. The medial limb and venous anastomosis were subsequently treated with a 7 mm x 40 mm Conquest balloon. Follow-up angiogram images were obtained. Five French catheter was advanced across the arterial anastomosis and an angiogram was performed in order to evaluate the anastomosis and lateral limb of the graft. The vascular sheaths were removed with purse string sutures. No immediate complication. FINDINGS: Occlusion of the left thigh loop graft. Patient has a covered stent along the medial aspect of the graft. Long segment stenosis along the medial limb of the graft required multiple angioplasties with 6 mm and 7 mm balloons. The graft is widely patent and flowing well at the end of the procedure. Mild residual stenosis along the medial limb of the graft at the end of the procedure. IMPRESSION: Successful declot of the left lower extremity graft. PLAN: The left thigh graft remains amenable to  percutaneous intervention. Electronically Signed   By: Markus Daft M.D.   On: 06/22/2017 14:20    Procedures Procedures  (including critical care time)  Medications Ordered in ED Medications  ondansetron (ZOFRAN) injection 4 mg (4 mg Intravenous Given 06/22/17 1559)     Initial Impression / Assessment and Plan / ED Course  I have reviewed the triage vital signs and the nursing notes.  Pertinent labs & imaging results that were available during my care of the patient were reviewed by me and considered in my medical decision making (see chart for details).     Patient presents with acute onset of chest pain and chest tightness as well as coughing up clear liquid after thrombectomy earlier today.  Has not had dialysis in 4 days.  Afebrile, initially hypertensive in the ED with some improvement.  EKG appears to be at the patient's baseline with no evidence of ST segment abnormality.  Potassium is 4 and a chest x-ray shows cardiomegaly but no acute abnormalities, no emergent need for dialysis at this time.  CT of the head shows no acute intracranial abnormalities, and patient was able to swallow liquid while in the ED but immediately "spit it back up "per nurse.  She continues to spit secretions into a small bag.  She has no evidence of angioedema and airway appears patent.  She denies dysphagia or odynophagia but states "I have something in my throat I need to spit up ".  Suspect possible esophageal dysmotility secondary to sedation earlier today. I doubt CVA.  Patient was found to have elevated troponins while in the ED, with worsening on second troponin.  Spoke with Dr. Shanon Brow with [facility] hospitalist service who agrees to assume care of patient and bring her into the hospital. Spoke with Dr. Kenton Kingfisher with cardiology who agrees to consult, and follow the patient during her time in the hospital.  Patient seen and evaluated by Dr. Ralene Bathe who agrees with assessment and plan at this time.  Final Clinical Impressions(s) / ED Diagnoses   Final diagnoses:  Elevated troponin  Chest pain in adult    ED Discharge Orders    None         Debroah Baller 06/22/17 2056    Quintella Reichert, MD 06/23/17 [phone]

## 2017-06-22 NOTE — Progress Notes (Signed)
Pt continues to feel worse. No CP, SOB, but still spitting up some white sputum. BP continues to rise, now 165/103 About 4 days since last HD, though K was 4.0 today.  Feel best if pt further assessed in ER. Called ER charge RN who advises to bring to to POD E after registration.  Ashlee Dike PA-C Interventional Radiology 06/22/2017 2:09 PM

## 2017-06-22 NOTE — Progress Notes (Signed)
Called to see pt with c/o 'trouble swallowing' Also elevated BP Did not take home BP meds today. Denies SOB, feels like some drainage in throat.  BP (!) 198/96   Pulse 70   Temp 97.8 F (36.6 C)   Resp 18   Ht 5\' 2"  (1.575 m)   Wt 211 lb (95.7 kg)   SpO2 100%   BMI 38.59 kg/m  Airway clear, no tongue or tonsillar edema Lungs CTA, no rales.  Will give pt normal home BP med, Coreg 6.25 mg, while here and continue to observe. May also have Norco as ordered  Ascencion Dike PA-C Interventional Radiology 06/22/2017 1:26 PM

## 2017-06-22 NOTE — Sedation Documentation (Signed)
Holding pressure to L thigh site

## 2017-06-23 ENCOUNTER — Encounter (HOSPITAL_COMMUNITY): Payer: Self-pay | Admitting: *Deleted

## 2017-06-23 ENCOUNTER — Other Ambulatory Visit (HOSPITAL_COMMUNITY): Payer: 59

## 2017-06-23 DIAGNOSIS — R748 Abnormal levels of other serum enzymes: Secondary | ICD-10-CM | POA: Diagnosis not present

## 2017-06-23 DIAGNOSIS — R079 Chest pain, unspecified: Secondary | ICD-10-CM | POA: Diagnosis not present

## 2017-06-23 DIAGNOSIS — I5032 Chronic diastolic (congestive) heart failure: Secondary | ICD-10-CM

## 2017-06-23 DIAGNOSIS — N186 End stage renal disease: Secondary | ICD-10-CM

## 2017-06-23 DIAGNOSIS — Z992 Dependence on renal dialysis: Secondary | ICD-10-CM

## 2017-06-23 DIAGNOSIS — I10 Essential (primary) hypertension: Secondary | ICD-10-CM

## 2017-06-23 DIAGNOSIS — R0789 Other chest pain: Secondary | ICD-10-CM | POA: Diagnosis not present

## 2017-06-23 LAB — RENAL FUNCTION PANEL
Albumin: 2.5 g/dL — ABNORMAL LOW (ref 3.5–5.0)
Anion gap: 14 (ref 5–15)
BUN: 74 mg/dL — ABNORMAL HIGH (ref 6–20)
CO2: 22 mmol/L (ref 22–32)
Calcium: 7.8 mg/dL — ABNORMAL LOW (ref 8.9–10.3)
Chloride: 102 mmol/L (ref 101–111)
Creatinine, Ser: 9.17 mg/dL — ABNORMAL HIGH (ref 0.44–1.00)
GFR calc Af Amer: 4 mL/min — ABNORMAL LOW (ref >60)
GFR calc non Af Amer: 4 mL/min — ABNORMAL LOW (ref >60)
Glucose, Bld: 186 mg/dL — ABNORMAL HIGH (ref 65–99)
Phosphorus: 7.5 mg/dL — ABNORMAL HIGH (ref 2.5–4.6)
Potassium: 5.3 mmol/L — ABNORMAL HIGH (ref 3.5–5.1)
Sodium: 138 mmol/L (ref 135–145)

## 2017-06-23 LAB — GLUCOSE, CAPILLARY: Glucose-Capillary: 176 mg/dL — ABNORMAL HIGH (ref 65–99)

## 2017-06-23 LAB — TROPONIN I
Troponin I: 0.33 ng/mL (ref <0.03)
Troponin I: 0.19 ng/mL (ref <0.03)

## 2017-06-23 MED ORDER — HEPARIN SODIUM (PORCINE) 5000 UNIT/ML IJ SOLN
5000.0000 [IU] | Freq: Two times a day (BID) | INTRAMUSCULAR | Status: DC
  Administered 2017-06-23 – 2017-06-24 (×2): 5000 [IU] via SUBCUTANEOUS
  Filled 2017-06-23 (×2): qty 1

## 2017-06-23 NOTE — ED Notes (Signed)
After taking medication, pt seemed to have small coughing fit, stating she "took too much water". Pt appeared to burp several times, and in general have mild difficulty after swallowing pill. Pt spit out small amount of water/saliva but seemed to retain medication. Informed [facility], who agreed to keep pt NPO except for sips with PO meds.

## 2017-06-23 NOTE — ED Notes (Signed)
Dr. Marthenia [place]aged to [phone]-per RN

## 2017-06-23 NOTE — Consult Note (Signed)
CARDIOLOGY CONSULT  Physician Requesting Consult:  Derrill Kay, MD  HPI:  Ashlee Miles is a 77 y.o. old female with history of ESRD on HD, DM, and CHF presents after thrombectomy of her left thigh HD graft.  She apparently tolerated the procedure well, however, afterward, she had significant nausea, and chest pain associated with cough and dysphagia.  The pain is described as a sharp stabbing pain in the center of her chest.  This pain did not radiate, however, it was associated with a persistent cough.  She states the chest pain and cough lasted approximately 1 hour and then spontaneously resolved.  It seems as though her dysphagia has persisted.  On my exam, she has no complaints and states she feels back to her baseline.  She denies shortness of breath, lightheadedness, dizziness.  Labs on presentation to the ED demonstrate a slightly elevated troponin of 0.15 which increased to 0.42.  Assessment/Plan  Elevated Troponin   Assessment:  Mildly elevated POC troponin.  Chest pain is atypical in nature and seems to have been associated with persistent cough and spontaneously resolved.  She is not chest pain free though her dysphagia persists.  Would trend lab troponins and should they continue to go up (>1.0) would start heparin for 48 hours.  If they do not increase, would treat as type II MI with outpatient ischemic workup.   Plan  -  Trend troponins  -  ECHO  -  Agree with coreg and simvastatin  -  NPO after midnight  Past Cardiovascular History:  - No documented h/o CAD - No documented h/o MI - No documented h/o CHF - No documented h/o PVD - No documented h/o AAA - No documented h/o valvular heart disease - No documented h/o CVA - No documented h/o Arrhythmias - No documented h/o A-fib  - No documented h/o congenital heart disease - No documented h/o CABG - No documented h/o PCI - No documented h/o cardiac devices (Pacer/ICD/CRT) - No documented h/o cardiac surgery       Most  recent stress test:  None  Most recent echocardiography:   03/02/17 - Left ventricle: The cavity size was normal. Wall thickness was   increased in a pattern of mild LVH. Systolic function was normal.   The estimated ejection fraction was in the range of 55% to 60%.   Wall motion was normal; there were no regional wall motion   abnormalities. Doppler parameters are consistent with abnormal   left ventricular relaxation (grade 1 diastolic dysfunction). - Mitral valve: There was mild regurgitation.  Most recent left heart catheterization:  None  CABG:  Date/ Physician: None  Device history:  None  Past Medical History:  Diagnosis Date  . CHF (congestive heart failure) ([place])   . Diabetes mellitus without complication ([place])   . ESRD (end stage renal disease) on dialysis ([place])   . History of pituitary adenoma   . Hypertension   . Protein C deficiency [facility])     Past Surgical History:  Procedure Laterality Date  . AV FISTULA PLACEMENT Left 03/16/2017   Procedure: INSERTION OF ARTERIOVENOUS (AV) GORE-TEX GRAFT THIGH-LEFT;  Surgeon: Angelia Mould, MD;  Location: [place];  Service: Vascular;  Laterality: Left;  . CHOLECYSTECTOMY    . DIALYSIS FISTULA CREATION     clotted off  . FALSE ANEURYSM REPAIR Left 05/10/2017   Procedure: REPAIR FALSE ANEURYSM left thigh AVGG using Gore 50mmx5cm Viabahn Endoprosthesis;  Surgeon: Serafina Mitchell, MD;  Location: [facility]  OR;  Service: Vascular;  Laterality: Left;  . INSERTION OF DIALYSIS CATHETER Right 03/16/2017   Procedure: INSERTION OF RIGHT FEMORAL TUNNELED DIALYSIS CATHETER;  Surgeon: Angelia Mould, MD;  Location: [facility] OR;  Service: Vascular;  Laterality: Right;  . IR THROMBECTOMY AV FISTULA W/THROMBOLYSIS/PTA INC/SHUNT/IMG LEFT Left 06/22/2017  . IR US GUIDE VASC ACCESS LEFT  06/22/2017  . IVC FILTER INSERTION    . PORTACATH PLACEMENT    . THROMBECTOMY AND REVISION OF ARTERIOVENTOUS (AV) GORETEX  GRAFT Left 06/02/2017   Procedure:  EVACUATION OF LEFT THIGH HEMATOMA  AND APPLICATION OF WOUND VAC;  Surgeon: Serafina Mitchell, MD;  Location: [facility] OR;  Service: Vascular;  Laterality: Left;    Social History   Socioeconomic History  . Marital status: Married    Spouse name: Not on file  . Number of children: Not on file  . Years of education: Not on file  . Highest education level: Not on file  Social Needs  . Financial resource strain: Not on file  . Food insecurity - worry: Not on file  . Food insecurity - inability: Not on file  . Transportation needs - medical: Not on file  . Transportation needs - non-medical: Not on file  Occupational History  . Occupation: retired  Tobacco Use  . Smoking status: Never Smoker  . Smokeless tobacco: Never Used  Substance and Sexual Activity  . Alcohol use: No  . Drug use: No  . Sexual activity: Not on file  Other Topics Concern  . Not on file  Social History Narrative  . Not on file    Family History  Problem Relation Age of Onset  . Breast cancer Mother   . Diabetes Sister   . Diabetes Brother   . CAD Other   . Stroke Neg Hx     No intake or output data in the 24 hours ending 06/23/17 0108  MEDS:    carvedilol 6.25 mg BID  insulin glargine 3 Units QHS  simvastatin 40 mg q1800  sucralfate 1 g TID WC & HS    Review of Systems:  GEN: no fever, chills, nausea, vomiting, weight change  HEENT: no vision or hearing changes  PULM: +coughing, SOB  CV: +chest pain, palpitations, PND, orthopnea  GI: no abdominal pain  GU: no dysuria  EXT: no swelling  SKIN: no rashes  NEURO: no numbness or tingling, +dysphagia HEME: no bleeding or bruising  GYN: none  --12 point review systems- otherwise negative.  Physical Examination: Blood pressure 139/64, pulse 72, temperature 98.2 F (36.8 C), temperature source Oral, resp. rate 14, SpO2 96 %. General:  AAOX 4.  NAD.  NRD.  Obese HENT: Normocephalic. Atraumatic.  No acute abnom. EYES: PERRL EOMI  Neck: Supple.  No  JVD.  No bruits. Cardiovascular:  Nl S1. Nl S2. 2/6 systolic murmur. RRR  Pulmonary/Chest: CTA B. No rales. No wheezing.  Abdomen: Soft, NT, no masses, no organomegaly. Neuro: CN intact, no motor/sensory deficit.  Ext: Warm. No edema.  SKIN- intact  Recent Labs    06/22/17 0835 06/22/17 1600  HGB  --  11.2*  HCT  --  34.7*  WBC  --  9.9  BUN 61*  --   CREATININE 8.52*  --   GLUCOSE 197*  --   CALCIUM 7.9*  --   INR 2.39  --     Discuss the benefits and adverse side affects of the medications use.  Discuss the benefits and adverse side affects of  the required study.  Discuss the risk and benefits of ambulation during hospitalization.   Baruch Merl, MD, PhD Cardiology

## 2017-06-23 NOTE — ED Notes (Signed)
Heart Healthy Diet ordered for Lunch. 

## 2017-06-23 NOTE — Progress Notes (Signed)
PROGRESS NOTE    Ashlee Miles  JJH:[id number] DOB: Sep 28, 1940 DOA: 06/22/2017 PCP: Darlina Rumpf, MD  Outpatient Specialists:    Brief Narrative: Patient is a 77 y.o. female with past medical history significant for ESRD on HD MWF via left thigh AV Graft. Patient underwent declotting and balloon angioplasty of thrombosed left thigh graft yesterday. Patient developed atypical chest pain post declotting and was sent to the ER. Patient was admitted for work up of the chest pain. Patient has been seen by the Cardiology team and cleared for discharge. Patient's last HD was 4 days ago. Nephrology was consulted for HD prior to DC. Patient was initially seen in ER, and seen again on Hemodialysis. Patient is not keen on being discharged back home today. Patient prefers to be discharged back home in am. Will consult case management to assist with discharge needs. Patient has a wound vac.  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 To resume HD MWF Chest pain has resolved  Pursue disposition in am.  DVT prophylaxis: [place] Heparin Code Status: Full Family Communication:  Disposition Plan: Home in am   Consultants:   Cardiology  nephrology  Procedures:   Underwent declotting of thrombosed left thigh AV Graft  Antimicrobials:   none    Subjective: No chest pain. No SOB.  Objective: Vitals:   06/23/17 1600 06/23/17 1630 06/23/17 1700 06/23/17 1730  BP: (!) 124/51 (!) 100/40 (!) 103/43 (!) 104/54  Pulse: 73 68 67 66  Resp: 17 18 18 16   Temp:    98 F (36.7 C)  TempSrc:    Oral  SpO2:    95%  Weight:    98.5 kg (217 lb 2.5 oz)    Intake/Output Summary  (Last 24 hours) at 06/23/2017 1829 Last data filed at 06/23/2017 1730 Gross per 24 hour  Intake -  Output 2000 ml  Net -2000 ml   Filed Weights   06/23/17 1345 06/23/17 1730  Weight: 100.5 kg (221 lb 9 oz) 98.5 kg (217 lb 2.5 oz)    Examination:  General exam: Appears calm and comfortable. Morbidly obese. Respiratory system: Clear to auscultation. Respiratory effort normal. Cardiovascular system: S1 & S2. Gastrointestinal system: Abdomen is nondistended, soft and nontender. No organomegaly or masses felt. Normal bowel sounds heard. Central nervous system: Alert and oriented. No focal neurological deficits.   Data Reviewed: I have personally reviewed following labs and imaging studies  CBC: Recent Labs  Lab 06/22/17 1600  WBC 9.9  HGB 11.2*  HCT 34.7*  MCV 85.3  PLT 818   Basic Metabolic Panel: Recent Labs  Lab 06/22/17 0835 06/23/17 1259  NA 137 138  K 4.0 5.3*  CL 100* 102  CO2 25 22  GLUCOSE 197* 186*  BUN 61* 74*  CREATININE 8.52* 9.17*  CALCIUM 7.9* 7.8*  PHOS  --  7.5*   GFR: Estimated Creatinine Clearance: 5.7 mL/min (A) (by C-G formula based on SCr of 9.17 mg/dL (H)). Liver Function Tests: Recent Labs  Lab 06/22/17 0837 06/23/17 1259  AST 15  --   ALT 7*  --   ALKPHOS 65  --   BILITOT 0.5  --  PROT 6.3*  --   ALBUMIN 3.0* 2.5*   Recent Labs  Lab 06/22/17 1600  LIPASE 28   No results for input(s): AMMONIA in the last 168 hours. Coagulation Profile: Recent Labs  Lab 06/22/17 0835  INR 2.39   Cardiac Enzymes: Recent Labs  Lab 06/23/17 0101 06/23/17 0811  TROPONINI 0.33* 0.19*   BNP (last 3 results) No results for input(s): PROBNP in the last 8760 hours. HbA1C: No results for input(s): HGBA1C in the last 72 hours. CBG: Recent Labs  Lab 06/22/17 0756  GLUCAP 190*   Lipid Profile: No results for input(s): CHOL, HDL, LDLCALC, TRIG, CHOLHDL, LDLDIRECT in the last 72 hours. Thyroid Function Tests: No results for input(s): TSH,  T4TOTAL, FREET4, T3FREE, THYROIDAB in the last 72 hours. Anemia Panel: No results for input(s): VITAMINB12, FOLATE, FERRITIN, TIBC, IRON, RETICCTPCT in the last 72 hours. Urine analysis:    Component Value Date/Time   COLORURINE AMBER (A) 06/06/2017 1636   APPEARANCEUR CLOUDY (A) 06/06/2017 1636   LABSPEC 1.018 06/06/2017 1636   PHURINE 5.0 06/06/2017 1636   GLUCOSEU NEGATIVE 06/06/2017 1636   HGBUR SMALL (A) 06/06/2017 1636   BILIRUBINUR NEGATIVE 06/06/2017 1636   KETONESUR NEGATIVE 06/06/2017 1636   PROTEINUR 100 (A) 06/06/2017 1636   NITRITE NEGATIVE 06/06/2017 1636   LEUKOCYTESUR NEGATIVE 06/06/2017 1636   Sepsis Labs: @LABRCNTIP (procalcitonin:4,lacticidven:4)  )No results found for this or any previous visit (from the past 240 hour(s)).       Radiology Studies: Dg Chest 2 View  Result Date: 06/22/2017 CLINICAL DATA:  Chest pain for a while which worsened today. EXAM: CHEST  2 VIEW COMPARISON:  PA and lateral chest 03/01/2017. FINDINGS: There is cardiomegaly without edema. No pneumothorax or pleural effusion. Aortic atherosclerosis is identified. Port-A-Cath and vascular stent in the left upper arm are in place. IMPRESSION: Cardiomegaly without acute disease. Atherosclerosis. Electronically Signed   By: Inge Rise M.D.   On: 06/22/2017 16:26   Ct Head Wo Contrast  Result Date: 06/22/2017 CLINICAL DATA:  PER ED NOTES: Patient transported from Short Stay after a left leg thrombectomy. After completion of procedure patient complained of difficulty swallowing. Patient alert and oriented and in no apparent distress at this time. EXAM: CT HEAD WITHOUT CONTRAST TECHNIQUE: Contiguous axial images were obtained from the base of the skull through the vertex without intravenous contrast. COMPARISON:  06/06/2017 FINDINGS: Brain: Diffuse cerebral atrophy. Mild ventricular dilatation consistent with central atrophy. Low-attenuation in the deep white matter consistent with small vessel  ischemia. No mass effect or midline shift. No abnormal extra-axial fluid collections. Gray-white matter junctions are distinct. Basal cisterns are not effaced. No acute intracranial hemorrhage. No significant changes since the prior study. Vascular: Internal carotid artery vascular calcifications are present. Skull: Normal. Negative for fracture or focal lesion. Sinuses/Orbits: No acute finding. Other: None. IMPRESSION: No acute intracranial abnormalities. Chronic atrophy and small vessel ischemic changes similar to prior study. Electronically Signed   By: Lucienne Capers M.D.   On: 06/22/2017 19:44   Ir US Guide Vasc Access Left  Result Date: 06/22/2017 INDICATION: End-stage renal disease and clotted left lower extremity graft. Patient has a wound VAC in left thigh. Patient also had a covered stent in placed in the left thigh AV graft due to a pseudoaneurysm. EXAM: DIALYSIS GRAFT DECLOT GRAFT / VENOUS PTA; ULTRASOUND GUIDANCE FOR VASCULAR ACCESS Physician: Stephan Minister. Anselm Pancoast, MD MEDICATIONS: None. ANESTHESIA/SEDATION: TPA 2 mg, Versed 6.0 mg, Fentanyl 200 mcg Moderate Sedation Time:  70 minutes The  patient was continuously monitored during the procedure by the interventional radiology nurse under my direct supervision. FLUOROSCOPY TIME:  Fluoroscopy Time: 13 minutes 24 seconds (172 mGy). COMPLICATIONS: None immediate. PROCEDURE: The procedure was explained to the patient. The risks and benefits of the procedure were discussed and the patient's questions were addressed. Informed consent was obtained from the patient. The left thigh was prepped and draped in a sterile fashion. Maximal barrier sterile technique was utilized including caps, mask, sterile gowns, sterile gloves, sterile drape, hand hygiene and skin antiseptic. The skin was anesthetized with 1% lidocaine. The graft was accessed using 21 gauge needle towards the venous anastomosis with ultrasound. The vascular access pointing towards the central veins was  upsized to a 7-French vascular sheath. A 5-French catheter was advanced into the central venous structures and a central venogram was performed. 2 mg of TPA was injected through the 5 French catheter as it was pulled through the graft. Fluoroscopic images were saved for documentation. A Bentson wire was placed centrally and the graft was treated with the AngioJet thrombectomy device. The venous anastomosis and medial limb were angioplastied with a 6 mm x 40 mm balloon. 21 gauge needle was directed into the graft towards the arterial anastomosis with ultrasound guidance. Micropuncture dilator set was placed and a 6 [place] vascular sheath was placed. A Glidewire and Fogarty balloon were advanced across arterial anastomosis. The arterial plug was pulled using the 5 [place] Fogarty balloon. There was flow in the graft. The arterial plug was pulled 2 times. Follow-up angiogram demonstrated residual clot at the apex of the graft. The graft was treated a second time with the Angiojet thrombectomy device. Follow-up angiogram demonstrated critical residual stenosis along the medial limb of the graft. This area was treated again with a 6 mm x 40 mm balloon. Follow-up angiogram images demonstrated residual stenosis along the medial limb. The medial limb and venous anastomosis were subsequently treated with a 7 mm x 40 mm Conquest balloon. Follow-up angiogram images were obtained. Five French catheter was advanced across the arterial anastomosis and an angiogram was performed in order to evaluate the anastomosis and lateral limb of the graft. The vascular sheaths were removed with purse string sutures. No immediate complication. FINDINGS: Occlusion of the left thigh loop graft. Patient has a covered stent along the medial aspect of the graft. Long segment stenosis along the medial limb of the graft required multiple angioplasties with 6 mm and 7 mm balloons. The graft is widely patent and flowing well at the end of the  procedure. Mild residual stenosis along the medial limb of the graft at the end of the procedure. IMPRESSION: Successful declot of the left lower extremity graft. PLAN: The left thigh graft remains amenable to percutaneous intervention. Electronically Signed   By: Markus Daft M.D.   On: 06/22/2017 14:20   Ir Thrombectomy Av Fistula W/thrombolysis/pta Inc/shunt/img Left  Result Date: 06/22/2017 INDICATION: End-stage renal disease and clotted left lower extremity graft. Patient has a wound VAC in left thigh. Patient also had a covered stent in placed in the left thigh AV graft due to a pseudoaneurysm. EXAM: DIALYSIS GRAFT DECLOT GRAFT / VENOUS PTA; ULTRASOUND GUIDANCE FOR VASCULAR ACCESS Physician: Stephan Minister. Anselm Pancoast, MD MEDICATIONS: None. ANESTHESIA/SEDATION: TPA 2 mg, Versed 6.0 mg, Fentanyl 200 mcg Moderate Sedation Time:  70 minutes The patient was continuously monitored during the procedure by the interventional radiology nurse under my direct supervision. FLUOROSCOPY TIME:  Fluoroscopy Time: 13 minutes 24 seconds (172 mGy). COMPLICATIONS:  None immediate. PROCEDURE: The procedure was explained to the patient. The risks and benefits of the procedure were discussed and the patient's questions were addressed. Informed consent was obtained from the patient. The left thigh was prepped and draped in a sterile fashion. Maximal barrier sterile technique was utilized including caps, mask, sterile gowns, sterile gloves, sterile drape, hand hygiene and skin antiseptic. The skin was anesthetized with 1% lidocaine. The graft was accessed using 21 gauge needle towards the venous anastomosis with ultrasound. The vascular access pointing towards the central veins was upsized to a 7-French vascular sheath. A 5-French catheter was advanced into the central venous structures and a central venogram was performed. 2 mg of TPA was injected through the 5 French catheter as it was pulled through the graft. Fluoroscopic images were saved for  documentation. A Bentson wire was placed centrally and the graft was treated with the AngioJet thrombectomy device. The venous anastomosis and medial limb were angioplastied with a 6 mm x 40 mm balloon. 21 gauge needle was directed into the graft towards the arterial anastomosis with ultrasound guidance. Micropuncture dilator set was placed and a 6 [place] vascular sheath was placed. A Glidewire and Fogarty balloon were advanced across arterial anastomosis. The arterial plug was pulled using the 5 [place] Fogarty balloon. There was flow in the graft. The arterial plug was pulled 2 times. Follow-up angiogram demonstrated residual clot at the apex of the graft. The graft was treated a second time with the Angiojet thrombectomy device. Follow-up angiogram demonstrated critical residual stenosis along the medial limb of the graft. This area was treated again with a 6 mm x 40 mm balloon. Follow-up angiogram images demonstrated residual stenosis along the medial limb. The medial limb and venous anastomosis were subsequently treated with a 7 mm x 40 mm Conquest balloon. Follow-up angiogram images were obtained. Five French catheter was advanced across the arterial anastomosis and an angiogram was performed in order to evaluate the anastomosis and lateral limb of the graft. The vascular sheaths were removed with purse string sutures. No immediate complication. FINDINGS: Occlusion of the left thigh loop graft. Patient has a covered stent along the medial aspect of the graft. Long segment stenosis along the medial limb of the graft required multiple angioplasties with 6 mm and 7 mm balloons. The graft is widely patent and flowing well at the end of the procedure. Mild residual stenosis along the medial limb of the graft at the end of the procedure. IMPRESSION: Successful declot of the left lower extremity graft. PLAN: The left thigh graft remains amenable to percutaneous intervention. Electronically Signed   By: Markus Daft M.D.    On: 06/22/2017 14:20        Scheduled Meds: . carvedilol  6.25 mg Oral BID  . insulin glargine  3 Units Subcutaneous QHS  . simvastatin  40 mg Oral q1800  . sucralfate  1 g Oral TID WC & HS   Continuous Infusions:   LOS: 0 days    Time spent: 31 Minutes    Dana Allan, MD  [facility] Pager #: [phone] 7PM-7AM contact night coverage as above

## 2017-06-23 NOTE — ED Notes (Signed)
Cardiology at bedside.

## 2017-06-23 NOTE — Progress Notes (Signed)
Progress Note  Patient Name: Ashlee Miles Date of Encounter: 06/23/2017  Primary Cardiologist: No primary care provider on file.   Patient Profile     77 y.o. female with history of ESRD on HD now for several months (has had failed upper extremity fistulas and is now using a left thigh HD graft) who carries a diagnosis of CHF (although has a relatively normal echocardiogram from last September) has not had dialysis since Monday, but having missed Wednesday because of issues with her access.  She subsequently underwent thrombectomy of her left thigh graft yesterday.  In the postop unit she noted a sharp stabbing chest discomfort associated with coughing gagging and dysphagia.  She was coughing up phlegm.  Overall the combination of symptoms lasted about an hour and then resolved but the dysphagia still persisted. Her troponins went from 0.5 up to 0.42 and have subsequently subsided. No further chest discomfort.   Subjective   She says that she just "does not feel like herself ".  No further chest pain.  She still has some of the dysphagia symptoms. --Had some indigestion and dysphagia earlier today trying to take medication.  Still notes coughing up phlegm /slimy stuff.  Inpatient Medications    Scheduled Meds: . carvedilol  6.25 mg Oral BID  . insulin glargine  3 Units Subcutaneous QHS  . simvastatin  40 mg Oral q1800  . sucralfate  1 g Oral TID WC & HS   Continuous Infusions:  PRN Meds: acetaminophen, ondansetron (ZOFRAN) IV   Vital Signs    Vitals:   06/23/17 0300 06/23/17 0700 06/23/17 0800 06/23/17 0900  BP: (!) 119/49 (!) 121/45 (!) 108/32 (!) 119/95  Pulse: 80 72 77 66  Resp:      Temp:      TempSrc:      SpO2: 97% 98% 96% 100%   No intake or output data in the 24 hours ending 06/23/17 1354 There were no vitals filed for this visit.  Telemetry    Sinus rhythm- Personally Reviewed  ECG    No new EKG today.  From yesterday sinus rhythm rate 72 bpm.  PVC noted.   Baseline wander.  No ischemic changes- Personally Reviewed  Physical Exam   GEN:  Resting quietly in bed.  No acute distress.  Morbidly obese;  Neck: No JVD -unable to really assess due to body habitus.  No carotid bruit Cardiac:  Distant S1 and S2.  RRR, no rubs, or gallops.  2/6 SEM at RUSB (could be from fistula) Respiratory: Clear to auscultation bilaterally -but with poor air movement.  No rales or rhonchi.  Mild expiratory wheeze if she forces it. GI: Soft, nontender, non-distended; morbidly obese MS: No edema; No deformity. -Bilateral proximity fistulas appeared to be nonfunctional, left leg fistula in place. Neuro:  Nonfocal  Psych: Normal affect   Labs    Chemistry Recent Labs  Lab 06/22/17 0835 06/22/17 0837  NA 137  --   K 4.0  --   CL 100*  --   CO2 25  --   GLUCOSE 197*  --   BUN 61*  --   CREATININE 8.52*  --   CALCIUM 7.9*  --   PROT  --  6.3*  ALBUMIN  --  3.0*  AST  --  15  ALT  --  7*  ALKPHOS  --  65  BILITOT  --  0.5  GFRNONAA 4*  --   GFRAA 5*  --   ANIONGAP  12  --      Hematology Recent Labs  Lab 06/22/17 1600  WBC 9.9  RBC 4.07  HGB 11.2*  HCT 34.7*  MCV 85.3  MCH 27.5  MCHC 32.3  RDW 18.1*  PLT 237    Cardiac Enzymes Recent Labs  Lab 06/23/17 0101 06/23/17 0811  TROPONINI 0.33* 0.19*    Recent Labs  Lab 06/22/17 1602 06/22/17 2004  TROPIPOC 0.15* 0.42*     BNPNo results for input(s): BNP, PROBNP in the last 168 hours.   DDimer No results for input(s): DDIMER in the last 168 hours.   Radiology    No new studies   Cardiac Studies   No studies from current hospitalization.  2D echo September 2018: EF 55-60%.  No regional wall motion normality.  GR 1 DD. CT angiogram chest October 2018: Coronary calcifications noted.   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  I tend to agree with Dr. Doreene Adas initial assessment per his consultation note. She describes very atypical sounding chest discomfort.  Very unlikely classic anginal symptoms.  It is associated with dysphagia and generalized feeling poorly.  Troponin level does have the typical elevation trend for acute coronary syndrome, however she is not having any further chest pain and I am not convinced that this is cardiac in nature -not convinced enough to consider cardiac catheterization, and I do not think a nuclear stress test would be beneficial (see below). My suspicion is that she is probably some component of volume overload with 4 days since her last dialysis.  It is quite possible that there is endocardial ischemia in setting of increased wall strain.  Being on dialysis, she does not clear troponin levels.  I agree with rechecking an echocardiogram to exclude new wall motion normality, however the troponin level is not likely high enough to cause a wall motion normality.  I think at this point I would allow her to eat if okay by medicine.  My inclination would be to have her go through dialysis, and then reassess her symptoms with ambulation.  We can consider outpatient ischemic evaluation.  Her chest CT angiogram showed coronary artery calcification, and we can consider coronary CTA to exclude any ischemic CAD.  I do not think that a nuclear stress test would be very beneficial given her weight and large pendulous breasts --  High likelihood of breast attenuation.  Agree with continuing beta-blocker and statin.  Would also add aspirin.  We will follow along to see if there is any recurrence of symptoms.  But for now would plan outpatient ischemic evaluation   For questions or updates, please contact [place] Please consult  [email or web address] for contact info under Cardiology/STEMI.      Signed, Glenetta Hew, MD  06/23/2017, 1:54 PM

## 2017-06-23 NOTE — Progress Notes (Signed)
Brief Nephrology Note:  S: Ashlee Miles was admitted with OBSERVATION status on 06/22/17 following successful declot of her L femoral dialysis graft by IR. After procedure, she developed CP, cough, and dysphagia. Pain resolved after 1 hr. Labs showed trop 0.15 -> 0.42, so she was kept overnight for cardiology evaluation.  Usually dialyzes on MWF scheduled at [facility]. Last HD was 06/18/17. Was unable to be dialyzed on 1/2 due to the clotted access.  Spoke to her hospitalist this morning. They are requesting dialysis today and then she will discharge home afterwards.  O: Vitals:   06/23/17 0800 06/23/17 0900  BP: (!) 108/32 (!) 119/95  Pulse: 77 66  Resp:    Temp:    SpO2: 96% 100%   Gen: Well appearing, NAD CV: RRR Extrem: No LE edema HD access: L thigh AVG with medial wound vac (s/p recent hematoma evac), + bruit with sutures from recent intervention present  HD orders: MWF at [facility] 4hr, 400/800, EDW 98.5kg, 3K/2.25Ca, AVG, heparin 3000 bolus - Mircera 220mcg IV q 2 weeks (last 12/28) - Hectoral 40mcg IV q HD  A/P:  ESRD: Will dialyze today since she has missed her past 2 treatments. 2.5L UF planned (which will actually take her about 0.5kg below EDW). Will remove sutures to her AVG prior to discharge.  Loren Racer, PA-C [facility] Pager: [phone]  Renal Attending: I agree with note as articulated  Estanislado Emms, MD

## 2017-06-24 ENCOUNTER — Encounter (HOSPITAL_COMMUNITY): Payer: Self-pay

## 2017-06-24 ENCOUNTER — Observation Stay (HOSPITAL_COMMUNITY): Payer: 59

## 2017-06-24 ENCOUNTER — Other Ambulatory Visit: Payer: Self-pay

## 2017-06-24 DIAGNOSIS — R0789 Other chest pain: Secondary | ICD-10-CM | POA: Diagnosis not present

## 2017-06-24 DIAGNOSIS — R079 Chest pain, unspecified: Secondary | ICD-10-CM | POA: Diagnosis not present

## 2017-06-24 LAB — ECHOCARDIOGRAM COMPLETE
AV Area VTI: 1.65 cm2
AV Peak grad: 15 mmHg
AV peak Index: 0.82
AV pk vel: 193 cm/s
Ao pk vel: 0.58 m/s
E decel time: 313 msec
E/e' ratio: 10.37
FS: 43 % (ref 28–44)
Height: 62 in
IVS/LV PW RATIO, ED: 1.29
LA ID, A-P, ES: 33 mm
LA diam end sys: 33 mm
LA diam index: 1.64 cm/m2
LA vol A4C: 58 ml
LV E/e' medial: 10.37
LV E/e'average: 10.37
LV PW d: 13.3 mm — AB (ref 0.6–1.1)
LV e' LATERAL: 9.03 cm/s
LVOT SV: 83 mL
LVOT VTI: 29.4 cm
LVOT area: 2.84 cm2
LVOT diameter: 19 mm
LVOT peak grad rest: 5 mmHg
LVOT peak vel: 112 cm/s
Lateral S' vel: 9.32 cm/s
MV Dec: 313
MV Peak grad: 4 mmHg
MV pk A vel: 115 m/s
MV pk E vel: 93.6 m/s
RV sys press: 44 mmHg
Reg peak vel: 322 cm/s
TAPSE: 31.1 mm
TDI e' lateral: 9.03
TDI e' medial: 7.83
TR max vel: 322 cm/s
Weight: 3608.49 oz

## 2017-06-24 LAB — PROTIME-INR
INR: 2.29
Prothrombin Time: 25 seconds — ABNORMAL HIGH (ref 11.4–15.2)

## 2017-06-24 MED ORDER — SEVELAMER CARBONATE 800 MG PO TABS
1600.0000 mg | ORAL_TABLET | Freq: Three times a day (TID) | ORAL | Status: DC
  Administered 2017-06-24: 1600 mg via ORAL
  Filled 2017-06-24: qty 2

## 2017-06-24 NOTE — Progress Notes (Signed)
This RN and pt's nurse tech attempted to ambulate pt. Pt was only able to walk 4 steps in her room. Pt encouraged to walk in hallway. Pt refused to ambulate any further, stating that her "neck hurt" and that she "couldn't walk at this time". Pt returned to bed with call light within reach. Will offer to ambulate pt again and continue current plan of care.   Grant Fontana BSN, RN

## 2017-06-24 NOTE — Progress Notes (Signed)
Pt discharged home with family, per order. Telemetry box removed. Pt and pt's family received discharge instructions. Pt discharged with wound vac that she was admitted with. Pt left with all of her belongings. Pt discharged via wheelchair and was accompanied by pt's nurse tech.   Grant Fontana BSN, RN

## 2017-06-24 NOTE — Evaluation (Signed)
Clinical/Bedside Swallow Evaluation Patient Details  Name: Ashlee Miles MRN: [id number] Date of Birth: 1940-11-16  Today's Date: 06/24/2017 Time: SLP Start Time (ACUTE ONLY): 1030 SLP Stop Time (ACUTE ONLY): 1040 SLP Time Calculation (min) (ACUTE ONLY): 10 min  Past Medical History:  Past Medical History:  Diagnosis Date  . CHF (congestive heart failure) ([place])   . Diabetes mellitus without complication ([place])   . ESRD (end stage renal disease) on dialysis ([place])   . History of pituitary adenoma   . Hypertension   . Protein C deficiency [facility])    Past Surgical History:  Past Surgical History:  Procedure Laterality Date  . AV FISTULA PLACEMENT Left 03/16/2017   Procedure: INSERTION OF ARTERIOVENOUS (AV) GORE-TEX GRAFT THIGH-LEFT;  Surgeon: Angelia Mould, MD;  Location: [facility];  Service: Vascular;  Laterality: Left;  . CHOLECYSTECTOMY    . DIALYSIS FISTULA CREATION     clotted off  . FALSE ANEURYSM REPAIR Left 05/10/2017   Procedure: REPAIR FALSE ANEURYSM left thigh AVGG using Gore 60mmx5cm Viabahn Endoprosthesis;  Surgeon: Serafina Mitchell, MD;  Location: [facility] OR;  Service: Vascular;  Laterality: Left;  . INSERTION OF DIALYSIS CATHETER Right 03/16/2017   Procedure: INSERTION OF RIGHT FEMORAL TUNNELED DIALYSIS CATHETER;  Surgeon: Angelia Mould, MD;  Location: [facility] OR;  Service: Vascular;  Laterality: Right;  . IR THROMBECTOMY AV FISTULA W/THROMBOLYSIS/PTA INC/SHUNT/IMG LEFT Left 06/22/2017  . IR US GUIDE VASC ACCESS LEFT  06/22/2017  . IVC FILTER INSERTION    . PORTACATH PLACEMENT    . THROMBECTOMY AND REVISION OF ARTERIOVENTOUS (AV) GORETEX  GRAFT Left 06/02/2017   Procedure: EVACUATION OF LEFT THIGH HEMATOMA  AND APPLICATION OF WOUND VAC;  Surgeon: Serafina Mitchell, MD;  Location: [facility] OR;  Service: Vascular;  Laterality: Left;   HPI:  77 y.o.femalewith history of ESRD on HD now for several months (has had failed upper extremity fistulas and is now using a left thigh HD graft)  who carries a diagnosis of CHF has not had dialysis since Monday, but having missed Wednesday because of issues with her access. She subsequently underwent thrombectomy of her left thigh graft yesterday. In the postop unit she noted a sharp stabbing chest discomfort associated with coughing gagging and dysphagia. She was coughing up phlegm. Overall the combination of symptoms lasted about an hour and then resolved but the dysphagia still persisted.   Assessment / Plan / Recommendation Clinical Impression  Pt demonstrates normal swallow function. She reports occasional episodes of feeling chest pressure and regurgitation in the past while eating and drinking. Yesterday pt was in bed, poorly positioned during meal, likely bending at her chest. Suspect mild baseline esophageal dysphagia. Offered basic compensatory strategies and suggested to pt and RN that pt get to the chair for her meals. Will sign off.       Aspiration Risk  Mild aspiration risk    Diet Recommendation Regular;Thin liquid   Liquid Administration via: Cup;Straw Medication Administration: Whole meds with liquid Supervision: Patient able to self feed Compensations: Follow solids with liquid Postural Changes: Seated upright at 90 degrees;Remain upright for at least 30 minutes after po intake    Other  Recommendations Oral Care Recommendations: Oral care BID   Follow up Recommendations None      Frequency and Duration            Prognosis        Swallow Study   General HPI: 77 y.o.femalewith history of ESRD on HD now for several  months (has had failed upper extremity fistulas and is now using a left thigh HD graft) who carries a diagnosis of CHF has not had dialysis since Monday, but having missed Wednesday because of issues with her access. She subsequently underwent thrombectomy of her left thigh graft yesterday. In the postop unit she noted a sharp stabbing chest discomfort associated with coughing gagging and  dysphagia. She was coughing up phlegm. Overall the combination of symptoms lasted about an hour and then resolved but the dysphagia still persisted. Type of Study: Bedside Swallow Evaluation Previous Swallow Assessment: none Diet Prior to this Study: Regular;Thin liquids Temperature Spikes Noted: No Respiratory Status: Room air History of Recent Intubation: No Behavior/Cognition: Alert;Cooperative;Pleasant mood Oral Cavity Assessment: Within Functional Limits Oral Care Completed by SLP: No Oral Cavity - Dentition: Adequate natural dentition Vision: Functional for self-feeding Self-Feeding Abilities: Able to feed self Patient Positioning: Upright in bed Baseline Vocal Quality: Normal Volitional Cough: Strong Volitional Swallow: Able to elicit    Oral/Motor/Sensory Function Overall Oral Motor/Sensory Function: Within functional limits   Ice Chips     Thin Liquid Thin Liquid: Within functional limits    Nectar Thick Nectar Thick Liquid: Not tested   Honey Thick Honey Thick Liquid: Not tested   Puree Puree: Within functional limits   Solid   GO   Solid: Within functional limits Presentation: [place] Burgandy Hackworth, MA CCC-SLP [phone]  Lynann Beaver 06/24/2017,11:04 AM

## 2017-06-24 NOTE — Care Management Note (Signed)
Case Management Note  Patient Details  Name: Ashlee Miles MRN: [id number] Date of Birth: 1940/11/20  Subjective/Objective:      Pt presented for CP, cough, nausea.  Pt had wound vac upon presenting to ED.  Wound Vac is [facility], HH through [facility].              Action/Plan: Orders placed to resume [place] services.  Bedside RN states she will obtain new canister for vac, but pt has all other necessary equipment.  Expected Discharge Date:  06/24/17               Expected Discharge Plan:  [place]  In-House Referral:  NA  Discharge planning Services  CM Consult  Post Acute Care Choice:  Home Health, Resumption of Svcs/PTA Provider Choice offered to:  Patient  DME Arranged:  N/A DME Agency:  NA  HH Arranged:  RN, PT [place] Agency:  [facility]  Status of Service:  Completed, signed off  If discussed at [place] of Stay Meetings, dates discussed:    Additional Comments:  Arley Phenix, RN 06/24/2017, 4:31 PM

## 2017-06-24 NOTE — Progress Notes (Addendum)
Progress Note  Patient Name: Ashlee Miles Date of Encounter: 06/24/2017  Primary Cardiologist: Dr. Ellyn Hack -New  Subjective   Pt denies chest pain or dyspnea. She is having shooting pains in her left hand which ws fractured some time just after Thanksgiving.   Pt was followed by cardiology in [place], [place] but has moved and would like to be followed by [facility] in [place].   Inpatient Medications    Scheduled Meds: . carvedilol  6.25 mg Oral BID  . heparin injection (subcutaneous)  5,000 Units Subcutaneous Q12H  . insulin glargine  3 Units Subcutaneous QHS  . simvastatin  40 mg Oral q1800  . sucralfate  1 g Oral TID WC & HS   Continuous Infusions:  PRN Meds: acetaminophen, ondansetron (ZOFRAN) IV   Vital Signs    Vitals:   06/23/17 1845 06/23/17 2152 06/24/17 0658 06/24/17 0858  BP: (!) 121/49 (!) 104/36 (!) 123/41 (!) 112/50  Pulse: 67 70  65  Resp: 15 16 15 17   Temp: 98.2 F (36.8 C) 98.4 F (36.9 C) 97.6 F (36.4 C)   TempSrc: Oral Oral Oral   SpO2: 92% 100% 100% 100%  Weight:        Intake/Output Summary (Last 24 hours) at 06/24/2017 1038 Last data filed at 06/23/2017 1730 Gross per 24 hour  Intake -  Output 2000 ml  Net -2000 ml   Filed Weights   06/23/17 1345 06/23/17 1730  Weight: 221 lb 9 oz (100.5 kg) 217 lb 2.5 oz (98.5 kg)    Telemetry    Sinus rhythm in the 60's - Personally Reviewed  ECG    No new tracings - Personally Reviewed  Physical Exam   GEN: Obese female, No acute distress.   Neck: No JVD Cardiac: RRR, 2/6 systolic murmur RUSB  Respiratory: Clear to auscultation bilaterally except few crackles in posterior bases. GI: Soft, nontender, non-distended  MS: No edema; left forearm-hand wrapped with splint. Wound-vac in place to left thigh.  Neuro:  Nonfocal  Psych: Normal affect   Labs    Chemistry Recent Labs  Lab 06/22/17 0835 06/22/17 0837 06/23/17 1259  NA 137  --  138  K 4.0  --  5.3*  CL 100*  --  102  CO2 25   --  22  GLUCOSE 197*  --  186*  BUN 61*  --  74*  CREATININE 8.52*  --  9.17*  CALCIUM 7.9*  --  7.8*  PROT  --  6.3*  --   ALBUMIN  --  3.0* 2.5*  AST  --  15  --   ALT  --  7*  --   ALKPHOS  --  65  --   BILITOT  --  0.5  --   GFRNONAA 4*  --  4*  GFRAA 5*  --  4*  ANIONGAP 12  --  14     Hematology Recent Labs  Lab 06/22/17 1600  WBC 9.9  RBC 4.07  HGB 11.2*  HCT 34.7*  MCV 85.3  MCH 27.5  MCHC 32.3  RDW 18.1*  PLT 237    Cardiac Enzymes Recent Labs  Lab 06/23/17 0101 06/23/17 0811  TROPONINI 0.33* 0.19*    Recent Labs  Lab 06/22/17 1602 06/22/17 2004  TROPIPOC 0.15* 0.42*     BNPNo results for input(s): BNP, PROBNP in the last 168 hours.   DDimer No results for input(s): DDIMER in the last 168 hours.   Radiology  Dg Chest 2 View  Result Date: 06/22/2017 CLINICAL DATA:  Chest pain for a while which worsened today. EXAM: CHEST  2 VIEW COMPARISON:  PA and lateral chest 03/01/2017. FINDINGS: There is cardiomegaly without edema. No pneumothorax or pleural effusion. Aortic atherosclerosis is identified. Port-A-Cath and vascular stent in the left upper arm are in place. IMPRESSION: Cardiomegaly without acute disease. Atherosclerosis. Electronically Signed   By: Inge Rise M.D.   On: 06/22/2017 16:26   Ct Head Wo Contrast  Result Date: 06/22/2017 CLINICAL DATA:  PER ED NOTES: Patient transported from Short Stay after a left leg thrombectomy. After completion of procedure patient complained of difficulty swallowing. Patient alert and oriented and in no apparent distress at this time. EXAM: CT HEAD WITHOUT CONTRAST TECHNIQUE: Contiguous axial images were obtained from the base of the skull through the vertex without intravenous contrast. COMPARISON:  06/06/2017 FINDINGS: Brain: Diffuse cerebral atrophy. Mild ventricular dilatation consistent with central atrophy. Low-attenuation in the deep white matter consistent with small vessel ischemia. No mass effect or  midline shift. No abnormal extra-axial fluid collections. Gray-white matter junctions are distinct. Basal cisterns are not effaced. No acute intracranial hemorrhage. No significant changes since the prior study. Vascular: Internal carotid artery vascular calcifications are present. Skull: Normal. Negative for fracture or focal lesion. Sinuses/Orbits: No acute finding. Other: None. IMPRESSION: No acute intracranial abnormalities. Chronic atrophy and small vessel ischemic changes similar to prior study. Electronically Signed   By: Lucienne Capers M.D.   On: 06/22/2017 19:44   Ir US Guide Vasc Access Left  Result Date: 06/22/2017 INDICATION: End-stage renal disease and clotted left lower extremity graft. Patient has a wound VAC in left thigh. Patient also had a covered stent in placed in the left thigh AV graft due to a pseudoaneurysm. EXAM: DIALYSIS GRAFT DECLOT GRAFT / VENOUS PTA; ULTRASOUND GUIDANCE FOR VASCULAR ACCESS Physician: Stephan Minister. Anselm Pancoast, MD MEDICATIONS: None. ANESTHESIA/SEDATION: TPA 2 mg, Versed 6.0 mg, Fentanyl 200 mcg Moderate Sedation Time:  70 minutes The patient was continuously monitored during the procedure by the interventional radiology nurse under my direct supervision. FLUOROSCOPY TIME:  Fluoroscopy Time: 13 minutes 24 seconds (172 mGy). COMPLICATIONS: None immediate. PROCEDURE: The procedure was explained to the patient. The risks and benefits of the procedure were discussed and the patient's questions were addressed. Informed consent was obtained from the patient. The left thigh was prepped and draped in a sterile fashion. Maximal barrier sterile technique was utilized including caps, mask, sterile gowns, sterile gloves, sterile drape, hand hygiene and skin antiseptic. The skin was anesthetized with 1% lidocaine. The graft was accessed using 21 gauge needle towards the venous anastomosis with ultrasound. The vascular access pointing towards the central veins was upsized to a 7-French vascular  sheath. A 5-French catheter was advanced into the central venous structures and a central venogram was performed. 2 mg of TPA was injected through the 5 French catheter as it was pulled through the graft. Fluoroscopic images were saved for documentation. A Bentson wire was placed centrally and the graft was treated with the AngioJet thrombectomy device. The venous anastomosis and medial limb were angioplastied with a 6 mm x 40 mm balloon. 21 gauge needle was directed into the graft towards the arterial anastomosis with ultrasound guidance. Micropuncture dilator set was placed and a 6 [place] vascular sheath was placed. A Glidewire and Fogarty balloon were advanced across arterial anastomosis. The arterial plug was pulled using the 5 [place] Fogarty balloon. There was flow in the graft. The  arterial plug was pulled 2 times. Follow-up angiogram demonstrated residual clot at the apex of the graft. The graft was treated a second time with the Angiojet thrombectomy device. Follow-up angiogram demonstrated critical residual stenosis along the medial limb of the graft. This area was treated again with a 6 mm x 40 mm balloon. Follow-up angiogram images demonstrated residual stenosis along the medial limb. The medial limb and venous anastomosis were subsequently treated with a 7 mm x 40 mm Conquest balloon. Follow-up angiogram images were obtained. Five French catheter was advanced across the arterial anastomosis and an angiogram was performed in order to evaluate the anastomosis and lateral limb of the graft. The vascular sheaths were removed with purse string sutures. No immediate complication. FINDINGS: Occlusion of the left thigh loop graft. Patient has a covered stent along the medial aspect of the graft. Long segment stenosis along the medial limb of the graft required multiple angioplasties with 6 mm and 7 mm balloons. The graft is widely patent and flowing well at the end of the procedure. Mild residual stenosis along  the medial limb of the graft at the end of the procedure. IMPRESSION: Successful declot of the left lower extremity graft. PLAN: The left thigh graft remains amenable to percutaneous intervention. Electronically Signed   By: Markus Daft M.D.   On: 06/22/2017 14:20   Ir Thrombectomy Av Fistula W/thrombolysis/pta Inc/shunt/img Left  Result Date: 06/22/2017 INDICATION: End-stage renal disease and clotted left lower extremity graft. Patient has a wound VAC in left thigh. Patient also had a covered stent in placed in the left thigh AV graft due to a pseudoaneurysm. EXAM: DIALYSIS GRAFT DECLOT GRAFT / VENOUS PTA; ULTRASOUND GUIDANCE FOR VASCULAR ACCESS Physician: Stephan Minister. Anselm Pancoast, MD MEDICATIONS: None. ANESTHESIA/SEDATION: TPA 2 mg, Versed 6.0 mg, Fentanyl 200 mcg Moderate Sedation Time:  70 minutes The patient was continuously monitored during the procedure by the interventional radiology nurse under my direct supervision. FLUOROSCOPY TIME:  Fluoroscopy Time: 13 minutes 24 seconds (172 mGy). COMPLICATIONS: None immediate. PROCEDURE: The procedure was explained to the patient. The risks and benefits of the procedure were discussed and the patient's questions were addressed. Informed consent was obtained from the patient. The left thigh was prepped and draped in a sterile fashion. Maximal barrier sterile technique was utilized including caps, mask, sterile gowns, sterile gloves, sterile drape, hand hygiene and skin antiseptic. The skin was anesthetized with 1% lidocaine. The graft was accessed using 21 gauge needle towards the venous anastomosis with ultrasound. The vascular access pointing towards the central veins was upsized to a 7-French vascular sheath. A 5-French catheter was advanced into the central venous structures and a central venogram was performed. 2 mg of TPA was injected through the 5 French catheter as it was pulled through the graft. Fluoroscopic images were saved for documentation. A Bentson wire was  placed centrally and the graft was treated with the AngioJet thrombectomy device. The venous anastomosis and medial limb were angioplastied with a 6 mm x 40 mm balloon. 21 gauge needle was directed into the graft towards the arterial anastomosis with ultrasound guidance. Micropuncture dilator set was placed and a 6 [place] vascular sheath was placed. A Glidewire and Fogarty balloon were advanced across arterial anastomosis. The arterial plug was pulled using the 5 [place] Fogarty balloon. There was flow in the graft. The arterial plug was pulled 2 times. Follow-up angiogram demonstrated residual clot at the apex of the graft. The graft was treated a second time with the Angiojet thrombectomy  device. Follow-up angiogram demonstrated critical residual stenosis along the medial limb of the graft. This area was treated again with a 6 mm x 40 mm balloon. Follow-up angiogram images demonstrated residual stenosis along the medial limb. The medial limb and venous anastomosis were subsequently treated with a 7 mm x 40 mm Conquest balloon. Follow-up angiogram images were obtained. Five French catheter was advanced across the arterial anastomosis and an angiogram was performed in order to evaluate the anastomosis and lateral limb of the graft. The vascular sheaths were removed with purse string sutures. No immediate complication. FINDINGS: Occlusion of the left thigh loop graft. Patient has a covered stent along the medial aspect of the graft. Long segment stenosis along the medial limb of the graft required multiple angioplasties with 6 mm and 7 mm balloons. The graft is widely patent and flowing well at the end of the procedure. Mild residual stenosis along the medial limb of the graft at the end of the procedure. IMPRESSION: Successful declot of the left lower extremity graft. PLAN: The left thigh graft remains amenable to percutaneous intervention. Electronically Signed   By: Markus Daft M.D.   On: 06/22/2017 14:20     Cardiac Studies   03/02/17 - Left ventricle: The cavity size was normal. Wall thickness was increased in a pattern of mild LVH. Systolic function was normal. The estimated ejection fraction was in the range of 55% to 60%. Wall motion was normal; there were no regional wall motion abnormalities. Doppler parameters are consistent with abnormal left ventricular relaxation (grade 1 diastolic dysfunction). - Mitral valve: There was mild regurgitation.  Patient Profile     77 y.o. female with history of ESRD on HD MWF, DM, HTN and CHF (although had normal echo in 02/2017) presents after thrombectomy of her left thigh HD graft. The patient was here on 06/22/17 for left thigh thrombectomy due to occluded/thrombosed left thigh graft. She had not had dialysis since Sunday, 4 days. Shortly after the procedure she developed sharp stabbing chest discomfort associated with coughing gagging and dysphagia.    Assessment & Plan    Atypical chest pain: Associated with dysphagia and generalized feeling poorly. Tropoonins elevated mildly to 0.33 but came down to 0.19. This is not felt to be a picture consistent with ACS. Could be related to volume overload in setting of no dialysis for 4 days. It was not felt that a cath was indicated. An echocardiogram has been ordered to evaluate LV function and wall motion. The patient was noted to have coronary artery calcifications on a CTA chest in 03/2017. Dr. Ellyn Hack felt that a nuclear stress test would not be very helpful in the patient due to her body habitus and high likelihood for breast attenuation. A coronary CTA could be considered in follow up. Chest pain free since initial episode.   Chronic diastolic CHF: EF 96-29% with grade 1 DD by echo in 02/2017. Pt presented after a thrombectomy of her AV graft and was been without dialysis for 4 days. No edema on CXR. She has been dialyzed. Continuing on carvedilol. BP stable.  Fluid management per nephrology. For  repeat echo.   Hyperlipidemia: LDL 127 in 02/2017. continue statin.   Diabetes type 2 on insulin: Last A1c in 02/2017 ws 7.3. Management per IM.   For questions or updates, please contact [facility] Please consult [email or web address] for contact info under Cardiology/STEMI.      Signed, Daune Perch, NP  06/24/2017, 10:38 AM    Attending note Patient  seen and discussed with NP Phylliss Bob, I agree with her documentation above. Admitted with atypical chest pain, thought possibly due to volume overload after missing an HD session according to prior evaluation. Peak trop 0.42 nonspecific in ESRD patient with volume overload. EKG SR without ischemic changes. Echo is pending.   Symptoms improved today after HD. F/u echo, if normal would not pursue further ischemic testing. I agree with Dr Ellyn Hack given her body habitus noninvasive stress testing would be of decreased yield, and there is not enough evidence at this time to warrant cath, particularly with a plausible alternative explanation and improved symptoms after HD. Order placed for nursing to ambulate patient and document symptoms.   Carlyle Dolly MD

## 2017-06-24 NOTE — Discharge Summary (Signed)
Physician Discharge Summary  Patient ID: Ashlee Miles MRN: [id number] DOB/AGE: [birth year] 77 y.o.  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 Patient underwent declotting and balloon angioplasty of thrombosed left thigh graft, originally planned as an outpatient procedure, however, Patient developed atypical chest pain post declotting and was sent to the ER. Patient was admitted for work up of the chest pain. Cardiac enzymes were cycled. Cardiology team was consulted to assist in directing patient's care. Patient was seen by the Cardiology team and cleared for discharge. Patient's last HD was 3-days prior to presentation. Nephrology was consulted for Hemodialysis. Patient will be discharged back home. Patient will need to follow up with PCP, Nephrology and Cardiology team.  Consults: cardiology and Nephrology  Discharge Exam: Blood pressure (!) 123/46, pulse 68, temperature 98.2 F (36.8 C), temperature source Oral, resp. rate 15, height 5\' 2"  (1.575 m), weight 102.3 kg (225 lb 8.5 oz), SpO2 100 %.   Disposition: 01-Home or Self Care  Discharge Instructions    Call MD for:   Complete by:  As directed    Call MD if symptoms worsen   Diet - low sodium heart healthy   Complete by:  As directed    Face-to-face encounter (required for Medicare/Medicaid patients)   Complete by:  As directed    I Bonnell Public certify that this patient is under my care and that I, or a  nurse practitioner or physician's assistant working with me, had a face-to-face encounter that meets the physician face-to-face encounter requirements with this patient on 06/24/2017. The encounter with the patient was in whole, or in part for the following medical condition(s) which is the primary reason for home health care (List medical condition):   The encounter with the patient was in whole, or in part, for the following medical condition, which is the primary reason for home health care:  ESRD on HD   I certify that, based on my findings, the following services are medically necessary home health services:  Physical therapy   Reason for Medically Necessary Home Health Services:  Therapy- Therapeutic Exercises to Increase Strength and Endurance   My clinical findings support the need for the above services:  Unable to leave home safely without assistance and/or assistive device   Further, I certify that my clinical findings support that this patient is homebound due to:  Unable to leave home safely without assistance   Home Health   Complete by:  As directed    To provide the following care/treatments:   PT Home Health Aide     Increase activity slowly   Complete by:  As directed      Allergies as of 06/24/2017   No Known Allergies     Medication List    STOP taking these medications   acetaminophen 325 MG tablet Commonly known as:  TYLENOL   camphor-menthol lotion Commonly known as:  SARNA   hydrOXYzine 25 MG tablet Commonly known as:  ATARAX/VISTARIL   methocarbamol  500 MG tablet Commonly known as:  ROBAXIN   ranitidine 150 MG tablet Commonly known as:  ZANTAC   traMADol 50 MG tablet Commonly known as:  ULTRAM     TAKE these medications   calcitRIOL 0.5 MCG capsule Commonly known as:  ROCALTROL Take 0.5 mcg by mouth daily.   carvedilol 6.25 MG tablet Commonly known as:  COREG Take 1 tablet (6.25 mg total) by mouth 2 (two) times daily.   docusate sodium 100 MG  capsule Commonly known as:  COLACE Take 1 capsule (100 mg total) by mouth 2 (two) times daily.   famotidine 20 MG tablet Commonly known as:  PEPCID Take 1 tablet (20 mg total) daily by mouth.   feeding supplement (PRO-STAT SUGAR FREE 64) Liqd Take 30 mLs by mouth 2 (two) times daily.   gabapentin 300 MG capsule Commonly known as:  NEURONTIN Take 1 capsule (300 mg total) by mouth at bedtime.   insulin aspart 100 UNIT/ML injection Commonly known as:  novoLOG Inject 0-9 Units into the skin 3 (three) times daily with meals.   insulin glargine 100 UNIT/ML injection Commonly known as:  LANTUS Inject 0.03 mLs (3 Units total) into the skin at bedtime.   ipratropium-albuterol 0.5-2.5 (3) MG/3ML Soln Commonly known as:  DUONEB Take 3 mLs by nebulization every 4 (four) hours as needed.   linaclotide 145 MCG Caps capsule Commonly known as:  LINZESS Take 145 mcg by mouth daily as needed.   midodrine 10 MG tablet Commonly known as:  PROAMATINE Take 1 tablet (10 mg total) by mouth every Monday, Wednesday, and Friday with hemodialysis.   multivitamin Tabs tablet Take 1 tablet by mouth at bedtime.   pantoprazole 40 MG tablet Commonly known as:  PROTONIX Take 1 tablet (40 mg total) 2 (two) times daily before a meal by mouth.   PARoxetine 10 MG tablet Commonly known as:  PAXIL Take 1 tablet (10 mg total) daily by mouth.   polyethylene glycol packet Commonly known as:  MIRALAX / GLYCOLAX Take 17 g daily by mouth.   simvastatin 40 MG tablet Commonly known as:  ZOCOR Take 40 mg by mouth daily at 6 PM.   sucralfate 1 GM/10ML suspension Commonly known as:  CARAFATE Take 10 mLs (1 g total) 4 (four) times daily -  with meals and at bedtime by mouth.   thiamine 100 MG tablet Commonly known as:  VITAMIN B-1 Take 1 tablet (100 mg total) by mouth daily.   triamcinolone cream 0.1 % Commonly known as:  KENALOG Apply 1 application topically every 12 (twelve) hours as needed.    warfarin 5 MG tablet Commonly known as:  COUMADIN Take 5 mg by mouth daily at 6 PM.        Signed: Bonnell Public 06/24/2017, 3:14 PM

## 2017-06-24 NOTE — Progress Notes (Signed)
   [facility] Progress Note   Subjective: Seen in room. No new complaints this am. Chest pain resolved. Doesn't remember HD yesterday.   Objective Vitals:   06/23/17 2152 06/24/17 0658 06/24/17 0858 06/24/17 1111  BP: (!) 104/36 (!) 123/41 (!) 112/50   Pulse: 70  65   Resp: 16 15 17    Temp: 98.4 F (36.9 C) 97.6 F (36.4 C)    TempSrc: Oral Oral    SpO2: 100% 100% 100%   Weight:    102.3 kg (225 lb 8.5 oz)  Height:    5\' 2"  (1.575 m)   Physical Exam General: Elderly female NAD  Heart: RRR  Lungs: CTAB  Abdomen: soft NT/ND  Extremities: no LE edema s/p medial thigh hematoma w wound VAC  Dialysis Access: L thigh AVG bandaged +bruit   Dialysis Orders:  MWF at [facility] 4hr, 400/800, EDW 98.5kg, 3K/2.25Ca, AVG, heparin 3000 bolus - Mircera 280mcg IV q 2 weeks (last 12/28) - Hectoral 77mcg IV q HD    Assessment/Plan: 1. Chest pain/CHF  - CP resolved this am, troponins mildly elevated on admit, trending down. Echo pending per cards 2. Clotted AVG - s/p successful IR thrombectomy 1/3 3. ESRD - MWF. Missed HD Wed 2/2 clotted AVG, HD in hospital 1/4. Continue on schedule. Next HD 1/7  4. 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 Add Renvela here and follow.   6. HTN/volume -  BP controlled. Post HD wt 1/4 98.5kg Net UF 2L,   7. Nutrition -  Renal/carb mod diet/Prostat/vitamins 8. DM  9. Hx DVT/Protein C deficiency - Coumadin held for recent intervention    Lynnda Child [facility] Pager [phone] 06/24/2017,11:21 AM  LOS: 0 days   Additional Objective Labs: Basic Metabolic Panel: Recent Labs  Lab 06/22/17 0835 06/23/17 1259  NA 137 138  K 4.0 5.3*  CL 100* 102  CO2 25 22  GLUCOSE 197* 186*  BUN 61* 74*  CREATININE 8.52* 9.17*  CALCIUM 7.9* 7.8*  PHOS  --  7.5*   CBC: Recent Labs  Lab 06/22/17 1600  WBC 9.9  HGB 11.2*  HCT 34.7*  MCV 85.3  PLT 237    Blood Culture    Component Value Date/Time   SDES BLOOD RIGHT HAND 05/31/2017 2245   SPECREQUEST IN PEDIATRIC BOTTLE Blood Culture adequate volume 05/31/2017 2245   CULT NO GROWTH 5 DAYS 05/31/2017 2245   REPTSTATUS 06/06/2017 FINAL 05/31/2017 2245    Cardiac Enzymes: Recent Labs  Lab 06/23/17 0101 06/23/17 0811  TROPONINI 0.33* 0.19*   CBG: Recent Labs  Lab 06/22/17 0756 06/23/17 2151  GLUCAP 190* 176*   Iron Studies: No results for input(s): IRON, TIBC, TRANSFERRIN, FERRITIN in the last 72 hours. Lab Results  Component Value Date   INR 2.29 06/24/2017   INR 2.39 06/22/2017   INR 1.45 06/12/2017   Medications:  . carvedilol  6.25 mg Oral BID  . heparin injection (subcutaneous)  5,000 Units Subcutaneous Q12H  . insulin glargine  3 Units Subcutaneous QHS  . simvastatin  40 mg Oral q1800  . sucralfate  1 g Oral TID WC & HS

## 2017-06-24 NOTE — Progress Notes (Signed)
  Echocardiogram 2D Echocardiogram has been performed.  Molli Gethers T Kiing Deakin 06/24/2017, 12:39 PM

## 2017-06-24 NOTE — Care Management Obs Status (Signed)
[facility] NOTIFICATION   Patient Details  Name: Ashlee Miles MRN: [id number] Date of Birth: June 08, 1941   Medicare Observation Status Notification Given:  Yes    Arley Phenix, RN 06/24/2017, 10:58 AM

## 2017-06-24 NOTE — Progress Notes (Signed)
ANTICOAGULATION CONSULT NOTE - Initial Consult  Pharmacy Consult for warfarin Indication: Hx DVT/Protein C deficiency   No Known Allergies  Patient Measurements: Height: 5\' 2"  (157.5 cm) Weight: 225 lb 8.5 oz (102.3 kg) IBW/kg (Calculated) : 50.1  Vital Signs: Temp: 98.2 F (36.8 C) (01/05 1328) Temp Source: Oral (01/05 1328) BP: 123/46 (01/05 1328) Pulse Rate: 68 (01/05 1328)  Labs: Recent Labs    06/22/17 0835 06/22/17 1600 06/23/17 0101 06/23/17 0811 06/23/17 1259 06/24/17 0210  HGB  --  11.2*  --   --   --   --   HCT  --  34.7*  --   --   --   --   PLT  --  237  --   --   --   --   LABPROT 25.9*  --   --   --   --  25.0*  INR 2.39  --   --   --   --  2.29  CREATININE 8.52*  --   --   --  9.17*  --   TROPONINI  --   --  0.33* 0.19*  --   --    Estimated Creatinine Clearance: 5.8 mL/min (A) (by C-G formula based on SCr of 9.17 mg/dL (H)).  Medical History: Past Medical History:  Diagnosis Date  . CHF (congestive heart failure) ([place])   . Diabetes mellitus without complication ([place])   . ESRD (end stage renal disease) on dialysis ([place])   . History of pituitary adenoma   . Hypertension   . Protein C deficiency ([place])     Medications:  Medications Prior to Admission  Medication Sig Dispense Refill Last Dose  . acetaminophen (TYLENOL) 325 MG tablet Take 650 mg by mouth every 6 (six) hours as needed for mild pain.    at prn  . Amino Acids-Protein Hydrolys (FEEDING SUPPLEMENT, PRO-STAT SUGAR FREE 64,) LIQD Take 30 mLs by mouth 2 (two) times daily. 900 mL 0 06/21/2017  . calcitRIOL (ROCALTROL) 0.5 MCG capsule Take 0.5 mcg by mouth daily.   06/21/2017  . carvedilol (COREG) 6.25 MG tablet Take 1 tablet (6.25 mg total) by mouth 2 (two) times daily. 60 tablet 0 06/21/2017  . docusate sodium (COLACE) 100 MG capsule Take 1 capsule (100 mg total) by mouth 2 (two) times daily. 10 capsule 0 06/21/2017  . famotidine (PEPCID) 20 MG tablet Take 1 tablet (20 mg total) daily by mouth. 30  tablet 0 06/21/2017  . gabapentin (NEURONTIN) 300 MG capsule Take 1 capsule (300 mg total) by mouth at bedtime. 30 capsule 0 06/21/2017  . hydrOXYzine (ATARAX/VISTARIL) 25 MG tablet Take 1 tablet (25 mg total) by mouth every 8 (eight) hours as needed for itching. 30 tablet 0  at prn  . insulin aspart (NOVOLOG) 100 UNIT/ML injection Inject 0-9 Units into the skin 3 (three) times daily with meals. 10 mL 11 06/21/2017  . insulin glargine (LANTUS) 100 UNIT/ML injection Inject 0.03 mLs (3 Units total) into the skin at bedtime. 10 mL 0 06/21/2017  . midodrine (PROAMATINE) 10 MG tablet Take 1 tablet (10 mg total) by mouth every Monday, Wednesday, and Friday with hemodialysis. 30 tablet 0 Past Week at Unknown time  . multivitamin (RENA-VIT) TABS tablet Take 1 tablet by mouth at bedtime. 30 tablet 0 06/21/2017  . pantoprazole (PROTONIX) 40 MG tablet Take 1 tablet (40 mg total) 2 (two) times daily before a meal by mouth. 60 tablet 0 06/21/2017  . PARoxetine (PAXIL) 10 MG  tablet Take 1 tablet (10 mg total) daily by mouth. 30 tablet 0 06/21/2017  . simvastatin (ZOCOR) 40 MG tablet Take 40 mg by mouth daily at 6 PM.   06/21/2017  . thiamine (VITAMIN B-1) 100 MG tablet Take 1 tablet (100 mg total) by mouth daily. 30 tablet 0 06/21/2017  . traMADol (ULTRAM) 50 MG tablet Take 1 tablet (50 mg total) by mouth every 6 (six) hours as needed. 15 tablet 0  at prn  . triamcinolone cream (KENALOG) 0.1 % Apply 1 application topically every 12 (twelve) hours as needed.    06/21/2017  . warfarin (COUMADIN) 5 MG tablet Take 5 mg by mouth daily at 6 PM.   06/20/2017  . camphor-menthol (SARNA) lotion Apply 1 application topically every 8 (eight) hours as needed for itching. (Patient not taking: Reported on 06/23/2017) 222 mL 0 Not Taking at Unknown time  . ipratropium-albuterol (DUONEB) 0.5-2.5 (3) MG/3ML SOLN Take 3 mLs by nebulization every 4 (four) hours as needed. 360 mL 0 Not Taking at Unknown time  . linaclotide (LINZESS) 145 MCG CAPS capsule Take  145 mcg by mouth daily as needed.     at prn  . methocarbamol (ROBAXIN) 500 MG tablet Take 1 tablet (500 mg total) every 8 (eight) hours as needed by mouth for muscle spasms. (Patient not taking: Reported on 06/23/2017) 15 tablet 0 Not Taking at Unknown time  . polyethylene glycol (MIRALAX / GLYCOLAX) packet Take 17 g daily by mouth. 14 each 0  at prn  . ranitidine (ZANTAC) 150 MG tablet Take 150 mg by mouth 2 (two) times daily.   Not Taking at Unknown time  . sucralfate (CARAFATE) 1 GM/10ML suspension Take 10 mLs (1 g total) 4 (four) times daily -  with meals and at bedtime by mouth. (Patient not taking: Reported on 06/23/2017) 420 mL 0 Not Taking at Unknown time    Assessment: 5 yoF on warfarin prior to admit for Hx DVT/Protein C deficiency (last dose PTA 1/1). Warfarin held for thrombectomy on 1/3. INR today 2.29, cbc stable, WNL  Warfarin dose PTA reported: 5mg  daily  Goal of Therapy:  INR 2-3 Monitor platelets by anticoagulation protocol: Yes   Plan:  Warfarin 2.5mg  QD Monitor daily INR/CBC Monitor for s/sx of bleeding  Addylin Manke L Elisheba Mcdonnell 06/24/2017,2:14 PM

## 2017-06-26 ENCOUNTER — Encounter: Payer: 59 | Admitting: Surgery

## 2017-06-27 ENCOUNTER — Emergency Department (HOSPITAL_COMMUNITY)
Admission: EM | Admit: 2017-06-27 | Discharge: 2017-06-27 | Disposition: A | Discharge status: Home / Self Care | Payer: 59 | Attending: Emergency Medicine | Admitting: Emergency Medicine

## 2017-06-27 ENCOUNTER — Encounter (HOSPITAL_COMMUNITY): Payer: Self-pay | Admitting: Surgery

## 2017-06-27 ENCOUNTER — Other Ambulatory Visit: Payer: Self-pay

## 2017-06-27 DIAGNOSIS — I509 Heart failure, unspecified: Secondary | ICD-10-CM | POA: Insufficient documentation

## 2017-06-27 DIAGNOSIS — Z86718 Personal history of other venous thrombosis and embolism: Secondary | ICD-10-CM | POA: Insufficient documentation

## 2017-06-27 DIAGNOSIS — E1122 Type 2 diabetes mellitus with diabetic chronic kidney disease: Secondary | ICD-10-CM | POA: Diagnosis not present

## 2017-06-27 DIAGNOSIS — Z452 Encounter for adjustment and management of vascular access device: Secondary | ICD-10-CM | POA: Diagnosis not present

## 2017-06-27 DIAGNOSIS — N186 End stage renal disease: Secondary | ICD-10-CM | POA: Insufficient documentation

## 2017-06-27 DIAGNOSIS — Z5189 Encounter for other specified aftercare: Secondary | ICD-10-CM

## 2017-06-27 DIAGNOSIS — Z79899 Other long term (current) drug therapy: Secondary | ICD-10-CM | POA: Diagnosis not present

## 2017-06-27 DIAGNOSIS — I132 Hypertensive heart and chronic kidney disease with heart failure and with stage 5 chronic kidney disease, or end stage renal disease: Secondary | ICD-10-CM | POA: Insufficient documentation

## 2017-06-27 DIAGNOSIS — Z794 Long term (current) use of insulin: Secondary | ICD-10-CM | POA: Insufficient documentation

## 2017-06-27 DIAGNOSIS — Z7901 Long term (current) use of anticoagulants: Secondary | ICD-10-CM | POA: Diagnosis not present

## 2017-06-27 DIAGNOSIS — Z992 Dependence on renal dialysis: Secondary | ICD-10-CM | POA: Diagnosis not present

## 2017-06-27 MED ORDER — HEPARIN SOD (PORK) LOCK FLUSH 100 UNIT/ML IV SOLN
500.0000 [IU] | INTRAVENOUS | Status: AC | PRN
  Administered 2017-06-27: 500 [IU]

## 2017-06-27 NOTE — ED Notes (Signed)
Signature pad unavailable for discharge. Patient verbally understands

## 2017-06-27 NOTE — Discharge Instructions (Signed)
Return if any problems.

## 2017-06-27 NOTE — ED Provider Notes (Signed)
[facility] EMERGENCY DEPARTMENT Provider Note   CSN: [id number] Arrival date & time: 06/27/17  1001     History   Chief Complaint Chief Complaint  Patient presents with  . power port removal    HPI Ashlee Miles is a 77 y.o. female.  Pt was discharged from the hospital and Iv access was left in pt's power port.  Pt has no complaints.  She is here to have removed,  No swelling no pain no redness   No language interpreter was used.    Past Medical History:  Diagnosis Date  . CHF (congestive heart failure) ([place])   . Diabetes mellitus without complication ([place])   . ESRD (end stage renal disease) on dialysis ([place])   . History of pituitary adenoma   . Hypertension   . Protein C deficiency [facility])     Patient Active Problem List   Diagnosis Date Noted  . Chest pain in adult 06/22/2017  . Elevated troponin   . Altered mental status   . Diabetes mellitus with end stage renal disease ([place]) 05/31/2017  . Depression 05/31/2017  . GERD (gastroesophageal reflux disease) 05/31/2017  . Left groin pain 05/31/2017  . Protein C deficiency ([place])   . Left leg swelling 05/09/2017  . 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 Falls 04/25/2017  . Hypotension 04/25/2017  . Epigastric pain 04/25/2017  . Constipation 04/25/2017  . ESRD (end stage renal disease) on dialysis ([place])   . ESRD on dialysis ([place]) 04/06/2017  . Anemia due to chronic kidney disease 04/06/2017  . Thrombocytopenia ([place]) 04/06/2017  . Cough in adult 04/06/2017  . DVT (deep venous thrombosis) ([place]) 04/05/2017  . Pressure injury of skin 03/14/2017  . Acute metabolic encephalopathy 62/95/2841  . Skin lesion of hand 03/12/2017  . TIA (transient ischemic attack) 03/01/2017  . Syncope 03/01/2017  . Diabetes mellitus with complication ([place]) 32/44/0102  . Essential hypertension 03/01/2017  . Chronic diastolic CHF (congestive heart failure) ([place]) 03/01/2017    Past Surgical History:  Procedure Laterality Date    . AV FISTULA PLACEMENT Left 03/16/2017   Procedure: INSERTION OF ARTERIOVENOUS (AV) GORE-TEX GRAFT THIGH-LEFT;  Surgeon: Angelia Mould, MD;  Location: [facility];  Service: Vascular;  Laterality: Left;  . CHOLECYSTECTOMY    . DIALYSIS FISTULA CREATION     clotted off  . FALSE ANEURYSM REPAIR Left 05/10/2017   Procedure: REPAIR FALSE ANEURYSM left thigh AVGG using Gore 14mmx5cm Viabahn Endoprosthesis;  Surgeon: Serafina Mitchell, MD;  Location[facility] OR;  Service: Vascular;  Laterality: Left;  . INSERTION OF DIALYSIS CATHETER Right 03/16/2017   Procedure: INSERTION OF RIGHT FEMORAL TUNNELED DIALYSIS CATHETER;  Surgeon: Angelia Mould, MD;  Location: [facility] OR;  Service: Vascular;  Laterality: Right;  . IR THROMBECTOMY AV FISTULA W/THROMBOLYSIS/PTA INC/SHUNT/IMG LEFT Left 06/22/2017  . IR US GUIDE VASC ACCESS LEFT  06/22/2017  . IVC FILTER INSERTION    . PORTACATH PLACEMENT    . THROMBECTOMY AND REVISION OF ARTERIOVENTOUS (AV) GORETEX  GRAFT Left 06/02/2017   Procedure: EVACUATION OF LEFT THIGH HEMATOMA  AND APPLICATION OF WOUND VAC;  Surgeon: Serafina Mitchell, MD;  Location: [facility] OR;  Service: Vascular;  Laterality: Left;    OB History    No data available       Home Medications    Prior to Admission medications   Medication Sig Start Date End Date Taking? Authorizing Provider  Amino Acids-Protein Hydrolys (FEEDING SUPPLEMENT, PRO-STAT SUGAR FREE 64,) LIQD Take 30 mLs by  mouth 2 (two) times daily. 04/12/17   Raiford Noble Latif, DO  calcitRIOL (ROCALTROL) 0.5 MCG capsule Take 0.5 mcg by mouth daily.    [provider]  carvedilol (COREG) 6.25 MG tablet Take 1 tablet (6.25 mg total) by mouth 2 (two) times daily. 03/04/17 03/04/18  Hosie Poisson, MD  docusate sodium (COLACE) 100 MG capsule Take 1 capsule (100 mg total) by mouth 2 (two) times daily. 04/12/17   Raiford Noble Latif, DO  famotidine (PEPCID) 20 MG tablet Take 1 tablet (20 mg total) daily by mouth. 04/28/17   Lavina Hamman, MD  gabapentin (NEURONTIN) 300 MG capsule Take 1 capsule (300 mg total) by mouth at bedtime. 06/09/17   Dessa Phi, DO  insulin aspart (NOVOLOG) 100 UNIT/ML injection Inject 0-9 Units into the skin 3 (three) times daily with meals. 03/22/17   Debbe Odea, MD  insulin glargine (LANTUS) 100 UNIT/ML injection Inject 0.03 mLs (3 Units total) into the skin at bedtime. 06/09/17   Dessa Phi, DO  ipratropium-albuterol (DUONEB) 0.5-2.5 (3) MG/3ML SOLN Take 3 mLs by nebulization every 4 (four) hours as needed. 04/12/17   Raiford Noble Latif, DO  linaclotide (LINZESS) 145 MCG CAPS capsule Take 145 mcg by mouth daily as needed.     [provider]  midodrine (PROAMATINE) 10 MG tablet Take 1 tablet (10 mg total) by mouth every Monday, Wednesday, and Friday with hemodialysis. 04/14/17   Raiford Noble Latif, DO  multivitamin (RENA-VIT) TABS tablet Take 1 tablet by mouth at bedtime. 04/12/17   Sheikh, Omair Latif, DO  pantoprazole (PROTONIX) 40 MG tablet Take 1 tablet (40 mg total) 2 (two) times daily before a meal by mouth. 04/28/17   Lavina Hamman, MD  PARoxetine (PAXIL) 10 MG tablet Take 1 tablet (10 mg total) daily by mouth. 04/28/17   Lavina Hamman, MD  polyethylene glycol [facility] / Floria Raveling) packet Take 17 g daily by mouth. 04/28/17   Lavina Hamman, MD  simvastatin (ZOCOR) 40 MG tablet Take 40 mg by mouth daily at 6 PM.    [provider]  sucralfate (CARAFATE) 1 GM/10ML suspension Take 10 mLs (1 g total) 4 (four) times daily -  with meals and at bedtime by mouth. Patient not taking: Reported on 06/23/2017 04/28/17   Lavina Hamman, MD  thiamine (VITAMIN B-1) 100 MG tablet Take 1 tablet (100 mg total) by mouth daily. 06/09/17   Dessa Phi, DO  triamcinolone cream (KENALOG) 0.1 % Apply 1 application topically every 12 (twelve) hours as needed.     [provider]  warfarin (COUMADIN) 5 MG tablet Take 5 mg by mouth daily at 6 PM.    [provider]     Family History Family History  Problem Relation Age of Onset  . Breast cancer Mother   . Diabetes Sister   . Diabetes Brother   . CAD Other   . Stroke Neg Hx     Social History Social History   Tobacco Use  . Smoking status: Never Smoker  . Smokeless tobacco: Never Used  Substance Use Topics  . Alcohol use: No  . Drug use: No     Allergies   Patient has no known allergies.   Review of Systems Review of Systems  All other systems reviewed and are negative.    Physical Exam Updated Vital Signs BP (!) 137/52 (BP Location: Right Arm)   Pulse 69   Temp 98 F (36.7 C) (Oral)   Resp 16  SpO2 100%   Physical Exam  Constitutional: She appears well-developed and well-nourished.  Skin:  No erythema at site   Psychiatric: She has a normal mood and affect.     ED Treatments / Results  Labs (all labs ordered are listed, but only abnormal results are displayed) Labs Reviewed - No data to display  EKG  EKG Interpretation None       Radiology No results found.  Procedures Procedures (including critical care time)  Medications Ordered in ED Medications  heparin lock flush 100 unit/mL (500 Units Intracatheter Given 06/27/17 1117)     Initial Impression / Assessment and Plan / ED Course  I have reviewed the triage vital signs and the nursing notes.  Pertinent labs & imaging results that were available during my care of the patient were reviewed by me and considered in my medical decision making (see chart for details).       Final Clinical Impressions(s) / ED Diagnoses   Final diagnoses:  None    ED Discharge Orders    None       Fransico Meadow, [place] 06/27/17 1147    Tegeler, Gwenyth Allegra, MD 06/27/17 [phone]

## 2017-06-27 NOTE — ED Triage Notes (Signed)
Pt was discharged on Wednesday and per Patient her power port was not removed, pt here today to have port removed.

## 2017-06-29 ENCOUNTER — Ambulatory Visit (INDEPENDENT_AMBULATORY_CARE_PROVIDER_SITE_OTHER): Payer: 59

## 2017-06-29 ENCOUNTER — Ambulatory Visit (INDEPENDENT_AMBULATORY_CARE_PROVIDER_SITE_OTHER): Payer: 59 | Admitting: Surgery

## 2017-06-29 ENCOUNTER — Telehealth (INDEPENDENT_AMBULATORY_CARE_PROVIDER_SITE_OTHER): Payer: Self-pay | Admitting: Orthopedic Surgery

## 2017-06-29 DIAGNOSIS — S62102A Fracture of unspecified carpal bone, left wrist, initial encounter for closed fracture: Secondary | ICD-10-CM | POA: Diagnosis not present

## 2017-06-29 DIAGNOSIS — M25532 Pain in left wrist: Secondary | ICD-10-CM | POA: Diagnosis not present

## 2017-06-29 MED ORDER — TRAMADOL HCL 50 MG PO TABS: 50.0000 mg | ORAL_TABLET | Freq: Two times a day (BID) | ORAL | 0 refills | Status: DC | PRN

## 2017-06-29 NOTE — Progress Notes (Signed)
Office Visit Note   Patient: Ashlee Miles           Date of Birth: March 06, 1941           MRN: [id number] Visit Date: 06/29/2017              Requested by: Darlina Rumpf, MD [address] [place], [place] [address] PCP: Darlina Rumpf, MD   Assessment & Plan: Visit Diagnoses:  1. Pain in left wrist   2. Wrist fracture, closed, left, initial encounter     Plan: Patient was put in a removable wrist splint.  She is instructed to wear this pretty much at all times.  Nonweightbearing through wrist.  Reviewed x-rays with Dr. Alphonzo Severance and she will follow-up with him in 3 weeks for recheck.  Follow-Up Instructions: Return in about 3 weeks (around 07/20/2017) for dr dean recheck left wrist fracture.   Orders:  Orders Placed This Encounter  Procedures  . XR Wrist 2 Views Left   Meds ordered this encounter  Medications  . traMADol (ULTRAM) 50 MG tablet    Sig: Take 1 tablet (50 mg total) by mouth every 12 (twelve) hours as needed.    Dispense:  30 tablet    Refill:  0      Procedures: No procedures performed   Clinical Data: No additional findings.   Subjective: No chief complaint on file.   HPI Patient comes in today with complaints of left wrist pain.  Wrist pain ongoing for at least a couple months after a fall that she suffered while in a rehab center.  Facility thought that she just bruised her wrist and did not have any imaging studies.  No immobilization.  Pain with flexion and extension of the wrist and with applying pressure.  Has had wrist swelling.  Past medical history significant for diabetes and end-stage renal disease. Review of Systems No current cardiac pulmonary issues  Objective: Vital Signs: There were no vitals taken for this visit.  Physical Exam  Constitutional: She is oriented to person, place, and time. No distress.  HENT:  Head: Normocephalic.  Eyes: EOM are normal. Pupils are equal, round, and reactive to light.  Pulmonary/Chest: No  respiratory distress.  Musculoskeletal:  Left wrist she does have some swelling.  Limited range of motion.  Distal radius is exquisitely tender.  Neurovascular intact.  Neurological: She is alert and oriented to person, place, and time.  Skin: Skin is warm and dry.  Psychiatric: She has a normal mood and affect.    Ortho Exam  Specialty Comments:  No specialty comments available.  Imaging: No results found.   PMFS History: Patient Active Problem List   Diagnosis Date Noted  . Chest pain in adult 06/22/2017  . Elevated troponin   . Altered mental status   . Diabetes mellitus with end stage renal disease ([place]) 05/31/2017  . Depression 05/31/2017  . GERD (gastroesophageal reflux disease) 05/31/2017  . Left groin pain 05/31/2017  . Protein C deficiency ([place])   . Left leg swelling 05/09/2017  . 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 Falls 04/25/2017  . Hypotension 04/25/2017  . Epigastric pain 04/25/2017  . Constipation 04/25/2017  . ESRD (end stage renal disease) on dialysis ([place])   . ESRD on dialysis ([place]) 04/06/2017  . Anemia due to chronic kidney disease 04/06/2017  . Thrombocytopenia ([place]) 04/06/2017  . Cough in adult 04/06/2017  . DVT (deep venous thrombosis) ([place]) 04/05/2017  . Pressure injury of skin 03/14/2017  .  Acute metabolic encephalopathy 98/33/8250  . Skin lesion of hand 03/12/2017  . TIA (transient ischemic attack) 03/01/2017  . Syncope 03/01/2017  . Diabetes mellitus with complication ([place]) 53/97/6734  . Essential hypertension 03/01/2017  . Chronic diastolic CHF (congestive heart failure) ([place]) 03/01/2017   Past Medical History:  Diagnosis Date  . CHF (congestive heart failure) ([place])   . Diabetes mellitus without complication ([place])   . ESRD (end stage renal disease) on dialysis ([place])   . History of pituitary adenoma   . Hypertension   . Protein C deficiency ([place])     Family History  Problem Relation Age of Onset  . Breast cancer Mother   . Diabetes  Sister   . Diabetes Brother   . CAD Other   . Stroke Neg Hx     Past Surgical History:  Procedure Laterality Date  . AV FISTULA PLACEMENT Left 03/16/2017   Procedure: INSERTION OF ARTERIOVENOUS (AV) GORE-TEX GRAFT THIGH-LEFT;  Surgeon: Angelia Mould, MD;  Location: [facility];  Service: Vascular;  Laterality: Left;  . CHOLECYSTECTOMY    . DIALYSIS FISTULA CREATION     clotted off  . FALSE ANEURYSM REPAIR Left 05/10/2017   Procedure: REPAIR FALSE ANEURYSM left thigh AVGG using Gore 65mmx5cm Viabahn Endoprosthesis;  Surgeon: Serafina Mitchell, MD;  Location: [facility] OR;  Service: Vascular;  Laterality: Left;  . INSERTION OF DIALYSIS CATHETER Right 03/16/2017   Procedure: INSERTION OF RIGHT FEMORAL TUNNELED DIALYSIS CATHETER;  Surgeon: Angelia Mould, MD;  Location: [facility] OR;  Service: Vascular;  Laterality: Right;  . IR THROMBECTOMY AV FISTULA W/THROMBOLYSIS/PTA INC/SHUNT/IMG LEFT Left 06/22/2017  . IR US GUIDE VASC ACCESS LEFT  06/22/2017  . IVC FILTER INSERTION    . PORTACATH PLACEMENT    . THROMBECTOMY AND REVISION OF ARTERIOVENTOUS (AV) GORETEX  GRAFT Left 06/02/2017   Procedure: EVACUATION OF LEFT THIGH HEMATOMA  AND APPLICATION OF WOUND VAC;  Surgeon: Serafina Mitchell, MD;  Location: [facility] OR;  Service: Vascular;  Laterality: Left;   Social History   Occupational History  . Occupation: retired  Tobacco Use  . Smoking status: Never Smoker  . Smokeless tobacco: Never Used  Substance and Sexual Activity  . Alcohol use: No  . Drug use: No  . Sexual activity: Not on file

## 2017-06-29 NOTE — Telephone Encounter (Signed)
Please advise 

## 2017-06-29 NOTE — Telephone Encounter (Signed)
Amber from [facility] called asking about patients weight bearing status and also if he walker needed a platform? CB # [phone]

## 2017-06-30 NOTE — Telephone Encounter (Signed)
Strict NWB left wrist!  Ok for platform walker

## 2017-07-03 NOTE — Telephone Encounter (Signed)
IC LM advising per Jeneen Rinks.

## 2017-07-05 ENCOUNTER — Ambulatory Visit (INDEPENDENT_AMBULATORY_CARE_PROVIDER_SITE_OTHER): Payer: 59 | Admitting: Surgery

## 2017-07-05 ENCOUNTER — Other Ambulatory Visit: Payer: Self-pay

## 2017-07-05 ENCOUNTER — Encounter: Payer: Self-pay | Admitting: Surgery

## 2017-07-05 VITALS — BP 138/73 | HR 69 | Temp 98.1°F | Resp 16 | Ht 62.0 in | Wt 200.0 lb

## 2017-07-05 DIAGNOSIS — N186 End stage renal disease: Secondary | ICD-10-CM

## 2017-07-05 DIAGNOSIS — Z992 Dependence on renal dialysis: Secondary | ICD-10-CM

## 2017-07-05 NOTE — Progress Notes (Signed)
Patient name: Tasha Diaz MRN: [id number] DOB: 1941-02-23 Sex: female  REASON FOR VISIT:     post op  HISTORY OF PRESENT ILLNESS:   Lateesha Bezold is a 77 y.o. female the patient returns today for follow-up.  She is status post evacuation of left thigh hematoma and placement of wound VAC on 06/02/2017.  CURRENT MEDICATIONS:    Current Outpatient Medications  Medication Sig Dispense Refill  . Amino Acids-Protein Hydrolys (FEEDING SUPPLEMENT, PRO-STAT SUGAR FREE 64,) LIQD Take 30 mLs by mouth 2 (two) times daily. 900 mL 0  . calcitRIOL (ROCALTROL) 0.5 MCG capsule Take 0.5 mcg by mouth daily.    . carvedilol (COREG) 6.25 MG tablet Take 1 tablet (6.25 mg total) by mouth 2 (two) times daily. 60 tablet 0  . docusate sodium (COLACE) 100 MG capsule Take 1 capsule (100 mg total) by mouth 2 (two) times daily. 10 capsule 0  . famotidine (PEPCID) 20 MG tablet Take 1 tablet (20 mg total) daily by mouth. 30 tablet 0  . gabapentin (NEURONTIN) 300 MG capsule Take 1 capsule (300 mg total) by mouth at bedtime. 30 capsule 0  . insulin aspart (NOVOLOG) 100 UNIT/ML injection Inject 0-9 Units into the skin 3 (three) times daily with meals. 10 mL 11  . insulin glargine (LANTUS) 100 UNIT/ML injection Inject 0.03 mLs (3 Units total) into the skin at bedtime. 10 mL 0  . ipratropium-albuterol (DUONEB) 0.5-2.5 (3) MG/3ML SOLN Take 3 mLs by nebulization every 4 (four) hours as needed. 360 mL 0  . linaclotide (LINZESS) 145 MCG CAPS capsule Take 145 mcg by mouth daily as needed.     . midodrine (PROAMATINE) 10 MG tablet Take 1 tablet (10 mg total) by mouth every Monday, Wednesday, and Friday with hemodialysis. 30 tablet 0  . multivitamin (RENA-VIT) TABS tablet Take 1 tablet by mouth at bedtime. 30 tablet 0  . pantoprazole (PROTONIX) 40 MG tablet Take 1 tablet (40 mg total) 2 (two) times daily before a meal by mouth. 60 tablet 0  . PARoxetine (PAXIL) 10 MG tablet Take 1 tablet (10 mg  total) daily by mouth. 30 tablet 0  . polyethylene glycol (MIRALAX / GLYCOLAX) packet Take 17 g daily by mouth. 14 each 0  . simvastatin (ZOCOR) 40 MG tablet Take 40 mg by mouth daily at 6 PM.    . sucralfate (CARAFATE) 1 GM/10ML suspension Take 10 mLs (1 g total) 4 (four) times daily -  with meals and at bedtime by mouth. 420 mL 0  . thiamine (VITAMIN B-1) 100 MG tablet Take 1 tablet (100 mg total) by mouth daily. 30 tablet 0  . traMADol (ULTRAM) 50 MG tablet Take 1 tablet (50 mg total) by mouth every 12 (twelve) hours as needed. 30 tablet 0  . triamcinolone cream (KENALOG) 0.1 % Apply 1 application topically every 12 (twelve) hours as needed.     . warfarin (COUMADIN) 5 MG tablet Take 5 mg by mouth daily at 6 PM.     No current facility-administered medications for this visit.     REVIEW OF SYSTEMS:   [X]  denotes positive finding, [ ]  denotes negative finding Cardiac  Comments:  Chest pain or chest pressure:    Shortness of breath upon exertion:    Short of breath when lying flat:    Irregular heart rhythm:    Constitutional    Fever or chills:      PHYSICAL EXAM:   Vitals:   07/05/17 0920  BP: 138/73  Pulse: 69  Resp: 16  Temp: 98.1 F (36.7 C)  TempSrc: Oral  Weight: 200 lb (90.7 kg)  Height: 5\' 2"  (1.575 m)    GENERAL: The patient is a well-nourished female, in no acute distress. The vital signs are documented above. CARDIOVASCULAR: There is a regular rate and rhythm. PULMONARY: Non-labored respirations The wound is approximately 1 cm deep.  It shows significant improvement.  There is a small amount of necrotic tissue present.  STUDIES:   None   MEDICAL ISSUES:   Continue wound VAC.  Patient is doing very well at this time.  I will see her back in 2 months for a wound check.  Annamarie Major, MD [facility] [phone] Pager [phone]

## 2017-07-11 ENCOUNTER — Encounter (HOSPITAL_COMMUNITY): Payer: Self-pay | Admitting: Emergency Medicine

## 2017-07-11 ENCOUNTER — Inpatient Hospital Stay (HOSPITAL_COMMUNITY)
Admission: EM | Admit: 2017-07-11 | Discharge: 2017-07-21 | DRG: 856 | Disposition: A | Discharge status: Skilled Nursing Facility | Payer: 59 | Attending: Internal Medicine | Admitting: Internal Medicine

## 2017-07-11 DIAGNOSIS — Z7952 Long term (current) use of systemic steroids: Secondary | ICD-10-CM

## 2017-07-11 DIAGNOSIS — Z79899 Other long term (current) drug therapy: Secondary | ICD-10-CM

## 2017-07-11 DIAGNOSIS — D631 Anemia in chronic kidney disease: Secondary | ICD-10-CM | POA: Diagnosis present

## 2017-07-11 DIAGNOSIS — Z6839 Body mass index (BMI) 39.0-39.9, adult: Secondary | ICD-10-CM

## 2017-07-11 DIAGNOSIS — Z86718 Personal history of other venous thrombosis and embolism: Secondary | ICD-10-CM

## 2017-07-11 DIAGNOSIS — Z803 Family history of malignant neoplasm of breast: Secondary | ICD-10-CM

## 2017-07-11 DIAGNOSIS — Z8249 Family history of ischemic heart disease and other diseases of the circulatory system: Secondary | ICD-10-CM

## 2017-07-11 DIAGNOSIS — S81802A Unspecified open wound, left lower leg, initial encounter: Secondary | ICD-10-CM | POA: Diagnosis present

## 2017-07-11 DIAGNOSIS — N189 Chronic kidney disease, unspecified: Secondary | ICD-10-CM | POA: Diagnosis present

## 2017-07-11 DIAGNOSIS — L03116 Cellulitis of left lower limb: Secondary | ICD-10-CM | POA: Diagnosis not present

## 2017-07-11 DIAGNOSIS — B952 Enterococcus as the cause of diseases classified elsewhere: Secondary | ICD-10-CM | POA: Diagnosis present

## 2017-07-11 DIAGNOSIS — L02416 Cutaneous abscess of left lower limb: Secondary | ICD-10-CM | POA: Diagnosis present

## 2017-07-11 DIAGNOSIS — Z23 Encounter for immunization: Secondary | ICD-10-CM

## 2017-07-11 DIAGNOSIS — Z992 Dependence on renal dialysis: Secondary | ICD-10-CM

## 2017-07-11 DIAGNOSIS — S52502D Unspecified fracture of the lower end of left radius, subsequent encounter for closed fracture with routine healing: Secondary | ICD-10-CM

## 2017-07-11 DIAGNOSIS — N186 End stage renal disease: Secondary | ICD-10-CM | POA: Diagnosis not present

## 2017-07-11 DIAGNOSIS — X58XXXA Exposure to other specified factors, initial encounter: Secondary | ICD-10-CM | POA: Diagnosis present

## 2017-07-11 DIAGNOSIS — W19XXXD Unspecified fall, subsequent encounter: Secondary | ICD-10-CM | POA: Diagnosis present

## 2017-07-11 DIAGNOSIS — T148XXA Other injury of unspecified body region, initial encounter: Secondary | ICD-10-CM

## 2017-07-11 DIAGNOSIS — N2581 Secondary hyperparathyroidism of renal origin: Secondary | ICD-10-CM | POA: Diagnosis present

## 2017-07-11 DIAGNOSIS — Z794 Long term (current) use of insulin: Secondary | ICD-10-CM

## 2017-07-11 DIAGNOSIS — Z833 Family history of diabetes mellitus: Secondary | ICD-10-CM

## 2017-07-11 DIAGNOSIS — I825Y9 Chronic embolism and thrombosis of unspecified deep veins of unspecified proximal lower extremity: Secondary | ICD-10-CM | POA: Diagnosis not present

## 2017-07-11 DIAGNOSIS — K219 Gastro-esophageal reflux disease without esophagitis: Secondary | ICD-10-CM

## 2017-07-11 DIAGNOSIS — E785 Hyperlipidemia, unspecified: Secondary | ICD-10-CM | POA: Diagnosis present

## 2017-07-11 DIAGNOSIS — Z79891 Long term (current) use of opiate analgesic: Secondary | ICD-10-CM

## 2017-07-11 DIAGNOSIS — T8149XA Infection following a procedure, other surgical site, initial encounter: Principal | ICD-10-CM | POA: Diagnosis present

## 2017-07-11 DIAGNOSIS — D6859 Other primary thrombophilia: Secondary | ICD-10-CM

## 2017-07-11 DIAGNOSIS — I132 Hypertensive heart and chronic kidney disease with heart failure and with stage 5 chronic kidney disease, or end stage renal disease: Secondary | ICD-10-CM | POA: Diagnosis present

## 2017-07-11 DIAGNOSIS — I82409 Acute embolism and thrombosis of unspecified deep veins of unspecified lower extremity: Secondary | ICD-10-CM | POA: Diagnosis present

## 2017-07-11 DIAGNOSIS — Z66 Do not resuscitate: Secondary | ICD-10-CM | POA: Diagnosis present

## 2017-07-11 DIAGNOSIS — E1122 Type 2 diabetes mellitus with diabetic chronic kidney disease: Secondary | ICD-10-CM

## 2017-07-11 DIAGNOSIS — Z9181 History of falling: Secondary | ICD-10-CM

## 2017-07-11 DIAGNOSIS — Z7901 Long term (current) use of anticoagulants: Secondary | ICD-10-CM

## 2017-07-11 DIAGNOSIS — M609 Myositis, unspecified: Secondary | ICD-10-CM | POA: Diagnosis present

## 2017-07-11 DIAGNOSIS — F329 Major depressive disorder, single episode, unspecified: Secondary | ICD-10-CM | POA: Diagnosis present

## 2017-07-11 DIAGNOSIS — I10 Essential (primary) hypertension: Secondary | ICD-10-CM | POA: Diagnosis present

## 2017-07-11 DIAGNOSIS — Z09 Encounter for follow-up examination after completed treatment for conditions other than malignant neoplasm: Secondary | ICD-10-CM

## 2017-07-11 DIAGNOSIS — S71102A Unspecified open wound, left thigh, initial encounter: Secondary | ICD-10-CM | POA: Diagnosis present

## 2017-07-11 DIAGNOSIS — I5032 Chronic diastolic (congestive) heart failure: Secondary | ICD-10-CM | POA: Diagnosis present

## 2017-07-11 DIAGNOSIS — E669 Obesity, unspecified: Secondary | ICD-10-CM | POA: Diagnosis present

## 2017-07-11 DIAGNOSIS — Z9889 Other specified postprocedural states: Secondary | ICD-10-CM

## 2017-07-11 DIAGNOSIS — Z86711 Personal history of pulmonary embolism: Secondary | ICD-10-CM

## 2017-07-11 DIAGNOSIS — Y838 Other surgical procedures as the cause of abnormal reaction of the patient, or of later complication, without mention of misadventure at the time of the procedure: Secondary | ICD-10-CM | POA: Diagnosis present

## 2017-07-11 DIAGNOSIS — Z8673 Personal history of transient ischemic attack (TIA), and cerebral infarction without residual deficits: Secondary | ICD-10-CM

## 2017-07-11 LAB — CBC WITH DIFFERENTIAL/PLATELET
Basophils Absolute: 0 10*3/uL (ref 0.0–0.1)
Basophils Relative: 0 %
Eosinophils Absolute: 0.1 10*3/uL (ref 0.0–0.7)
Eosinophils Relative: 1 %
HCT: 34.4 % — ABNORMAL LOW (ref 36.0–46.0)
Hemoglobin: 11 g/dL — ABNORMAL LOW (ref 12.0–15.0)
Lymphocytes Relative: 15 %
Lymphs Abs: 2.1 10*3/uL (ref 0.7–4.0)
MCH: 26.5 pg (ref 26.0–34.0)
MCHC: 32 g/dL (ref 30.0–36.0)
MCV: 82.9 fL (ref 78.0–100.0)
Monocytes Absolute: 0.9 10*3/uL (ref 0.1–1.0)
Monocytes Relative: 6 %
Neutro Abs: 10.7 10*3/uL — ABNORMAL HIGH (ref 1.7–7.7)
Neutrophils Relative %: 78 %
Platelets: 154 10*3/uL (ref 150–400)
RBC: 4.15 MIL/uL (ref 3.87–5.11)
RDW: 17.4 % — ABNORMAL HIGH (ref 11.5–15.5)
WBC: 13.7 10*3/uL — ABNORMAL HIGH (ref 4.0–10.5)

## 2017-07-11 LAB — BASIC METABOLIC PANEL
Anion gap: 15 (ref 5–15)
BUN: 39 mg/dL — ABNORMAL HIGH (ref 6–20)
CO2: 22 mmol/L (ref 22–32)
Calcium: 8.1 mg/dL — ABNORMAL LOW (ref 8.9–10.3)
Chloride: 98 mmol/L — ABNORMAL LOW (ref 101–111)
Creatinine, Ser: 6.94 mg/dL — ABNORMAL HIGH (ref 0.44–1.00)
GFR calc Af Amer: 6 mL/min — ABNORMAL LOW (ref >60)
GFR calc non Af Amer: 5 mL/min — ABNORMAL LOW (ref >60)
Glucose, Bld: 239 mg/dL — ABNORMAL HIGH (ref 65–99)
Potassium: 4 mmol/L (ref 3.5–5.1)
Sodium: 135 mmol/L (ref 135–145)

## 2017-07-11 MED ORDER — SODIUM CHLORIDE 0.9% FLUSH
10.0000 mL | INTRAVENOUS | Status: DC | PRN
  Administered 2017-07-13: 10 mL
  Administered 2017-07-15: 20 mL
  Administered 2017-07-18 – 2017-07-21 (×4): 10 mL
  Filled 2017-07-11 (×6): qty 40

## 2017-07-11 MED ORDER — TETANUS-DIPHTH-ACELL PERTUSSIS 5-2.5-18.5 LF-MCG/0.5 IM SUSP
0.5000 mL | Freq: Once | INTRAMUSCULAR | Status: AC
  Administered 2017-07-12: 0.5 mL via INTRAMUSCULAR
  Filled 2017-07-11: qty 0.5

## 2017-07-11 MED ORDER — PIPERACILLIN-TAZOBACTAM 3.375 G IVPB: 3.3750 g | Freq: Two times a day (BID) | INTRAVENOUS | Status: DC

## 2017-07-11 NOTE — ED Notes (Signed)
Writer attempted to collect blood, unsuccessful, RN notified

## 2017-07-11 NOTE — Progress Notes (Signed)
   Daily Progress Note   Case discussed briefly with ED PA.  Pt is known to Dr. Trula Slade from procedures necessitated by cannulation complications.  Pt was seen less than a week ago in the office.  At that time Dr. Trula Slade anticipated full healing of this patient wounds.  Per ED PA, pt appears to have a new focus of cellulitis not directly involving the prior surgical site.  - Admit to Hospitalist service: multiple co-morbidities including IDDM, ESRD-HD, CHF - Broad spectrum abx - Labs pending - CT L leg to evaluate for any pockets of fluid or air - Will check on patient in AM on studies completed.   Adele Barthel, MD, FACS [facility] Office: [phone] Pager: [phone]  07/11/2017, 10:56 PM

## 2017-07-11 NOTE — ED Provider Notes (Signed)
[facility] EMERGENCY DEPARTMENT Provider Note   CSN: [id number] Arrival date & time: 07/11/17  1957     History   Chief Complaint Chief Complaint  Patient presents with  . Wound Check    HPI Ashlee Miles is a 77 y.o. female.  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  Patient reports that she has a wound care nurse come out once a week to examine the wound VAC, and the wound care noticed that interim over 1 week there was increased erythema spreading on the thigh.  Patient reports that this leg is more painful, and she has tenderness over the site of erythema.  No increasing drainage from the wound VAC.  Patient denies fever, chills, chest pain, shortness of breath, nausea, vomiting.  Patient did have hematoma evacuation performed on the left medial thigh by vascular surgery on 06-02-2017 with recent follow-up without complication.  Patient had dialysis yesterday.  Past Medical History:  Diagnosis Date  . CHF (congestive heart failure) ([place])   . Diabetes mellitus without complication ([place])   . ESRD (end stage renal disease) on dialysis ([place])   . History of pituitary adenoma   . Hypertension   . Protein C deficiency [facility])     Patient Active Problem List   Diagnosis Date Noted  . Chest pain in adult 06/22/2017  . Elevated troponin   . Altered mental status   . Diabetes mellitus with end stage renal disease ([place]) 05/31/2017  . Depression 05/31/2017  . GERD (gastroesophageal reflux disease) 05/31/2017  . Left groin pain 05/31/2017  . Protein C deficiency ([place])   . Left leg swelling 05/09/2017  . 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 Falls 04/25/2017  . Hypotension 04/25/2017  . Epigastric pain 04/25/2017  . Constipation 04/25/2017    . ESRD (end stage renal disease) on dialysis ([place])   . ESRD on dialysis ([place]) 04/06/2017  . Anemia due to chronic kidney disease 04/06/2017  . Thrombocytopenia ([place]) 04/06/2017  . Cough in adult 04/06/2017  . DVT (deep venous thrombosis) ([place]) 04/05/2017  . Pressure injury of skin 03/14/2017  . Acute metabolic encephalopathy 32/99/2426  . Skin lesion of hand 03/12/2017  . TIA (transient ischemic attack) 03/01/2017  . Syncope 03/01/2017  . Diabetes mellitus with complication ([place]) 83/41/9622  . Essential hypertension 03/01/2017  . Chronic diastolic CHF (congestive heart failure) ([place]) 03/01/2017    Past Surgical History:  Procedure Laterality Date  . AV FISTULA PLACEMENT Left 03/16/2017   Procedure: INSERTION OF ARTERIOVENOUS (AV) GORE-TEX GRAFT THIGH-LEFT;  Surgeon: Angelia Mould, MD;  Location: [facility];  Service: Vascular;  Laterality: Left;  . CHOLECYSTECTOMY    . DIALYSIS FISTULA CREATION     clotted off  . FALSE ANEURYSM REPAIR Left 05/10/2017   Procedure: REPAIR FALSE ANEURYSM left thigh AVGG using Gore 77mmx5cm Viabahn Endoprosthesis;  Surgeon: Serafina Mitchell, MD;  Location: [facility] OR;  Service: Vascular;  Laterality: Left;  . INSERTION OF DIALYSIS CATHETER Right 03/16/2017   Procedure: INSERTION OF RIGHT FEMORAL TUNNELED DIALYSIS CATHETER;  Surgeon: Angelia Mould, MD;  Location: [facility] OR;  Service: Vascular;  Laterality: Right;  . IR THROMBECTOMY AV FISTULA W/THROMBOLYSIS/PTA INC/SHUNT/IMG LEFT Left 06/22/2017  . IR US GUIDE VASC ACCESS LEFT  06/22/2017  .  IVC FILTER INSERTION    . PORTACATH PLACEMENT    . THROMBECTOMY AND REVISION OF ARTERIOVENTOUS (AV) GORETEX  GRAFT Left 06/02/2017   Procedure: EVACUATION OF LEFT THIGH HEMATOMA  AND APPLICATION OF WOUND VAC;  Surgeon: Serafina Mitchell, MD;  Location: [facility] OR;  Service: Vascular;  Laterality: Left;    OB History    No data available       Home Medications    Prior to Admission medications   Medication Sig  Start Date End Date Taking? Authorizing Provider  Amino Acids-Protein Hydrolys (FEEDING SUPPLEMENT, PRO-STAT SUGAR FREE 64,) LIQD Take 30 mLs by mouth 2 (two) times daily. 04/12/17   Raiford Noble Latif, DO  calcitRIOL (ROCALTROL) 0.5 MCG capsule Take 0.5 mcg by mouth daily.    [provider]  carvedilol (COREG) 6.25 MG tablet Take 1 tablet (6.25 mg total) by mouth 2 (two) times daily. 03/04/17 03/04/18  Hosie Poisson, MD  docusate sodium (COLACE) 100 MG capsule Take 1 capsule (100 mg total) by mouth 2 (two) times daily. 04/12/17   Raiford Noble Latif, DO  famotidine (PEPCID) 20 MG tablet Take 1 tablet (20 mg total) daily by mouth. 04/28/17   Lavina Hamman, MD  gabapentin (NEURONTIN) 300 MG capsule Take 1 capsule (300 mg total) by mouth at bedtime. 06/09/17   Dessa Phi, DO  insulin aspart (NOVOLOG) 100 UNIT/ML injection Inject 0-9 Units into the skin 3 (three) times daily with meals. 03/22/17   Debbe Odea, MD  insulin glargine (LANTUS) 100 UNIT/ML injection Inject 0.03 mLs (3 Units total) into the skin at bedtime. 06/09/17   Dessa Phi, DO  ipratropium-albuterol (DUONEB) 0.5-2.5 (3) MG/3ML SOLN Take 3 mLs by nebulization every 4 (four) hours as needed. 04/12/17   Raiford Noble Latif, DO  linaclotide (LINZESS) 145 MCG CAPS capsule Take 145 mcg by mouth daily as needed.     [provider]  midodrine (PROAMATINE) 10 MG tablet Take 1 tablet (10 mg total) by mouth every Monday, Wednesday, and Friday with hemodialysis. 04/14/17   Raiford Noble Latif, DO  multivitamin (RENA-VIT) TABS tablet Take 1 tablet by mouth at bedtime. 04/12/17   Sheikh, Omair Latif, DO  pantoprazole (PROTONIX) 40 MG tablet Take 1 tablet (40 mg total) 2 (two) times daily before a meal by mouth. 04/28/17   Lavina Hamman, MD  PARoxetine (PAXIL) 10 MG tablet Take 1 tablet (10 mg total) daily by mouth. 04/28/17   Lavina Hamman, MD  polyethylene glycol [facility] / Floria Raveling) packet Take 17 g daily by mouth.  04/28/17   Lavina Hamman, MD  simvastatin (ZOCOR) 40 MG tablet Take 40 mg by mouth daily at 6 PM.    [provider]  sucralfate (CARAFATE) 1 GM/10ML suspension Take 10 mLs (1 g total) 4 (four) times daily -  with meals and at bedtime by mouth. 04/28/17   Lavina Hamman, MD  thiamine (VITAMIN B-1) 100 MG tablet Take 1 tablet (100 mg total) by mouth daily. 06/09/17   Dessa Phi, DO  traMADol (ULTRAM) 50 MG tablet Take 1 tablet (50 mg total) by mouth every 12 (twelve) hours as needed. 06/29/17   Lanae Crumbly, PA-C  triamcinolone cream (KENALOG) 0.1 % Apply 1 application topically every 12 (twelve) hours as needed.     [provider]  warfarin (COUMADIN) 5 MG tablet Take 5 mg by mouth daily at 6 PM.    [provider]    Family History Family History  Problem  Relation Age of Onset  . Breast cancer Mother   . Diabetes Sister   . Diabetes Brother   . CAD Other   . Stroke Neg Hx     Social History Social History   Tobacco Use  . Smoking status: Never Smoker  . Smokeless tobacco: Never Used  Substance Use Topics  . Alcohol use: No  . Drug use: No     Allergies   Other   Review of Systems Review of Systems  Constitutional: Negative for chills and fever.  Respiratory: Negative for cough and shortness of breath.   Cardiovascular: Negative for chest pain.  Gastrointestinal: Negative for nausea and vomiting.  Musculoskeletal: Positive for myalgias.  Skin: Positive for color change and wound.  All other systems reviewed and are negative.    Physical Exam Updated Vital Signs BP (!) 108/47   Pulse 66   Temp 98 F (36.7 C) (Oral)   Resp 18   SpO2 95%   Physical Exam  Constitutional: She appears well-developed and well-nourished. No distress.  HENT:  Head: Normocephalic and atraumatic.  Mouth/Throat: Oropharynx is clear and moist.  Eyes: Conjunctivae and EOM are normal. Pupils are equal, round, and reactive to light.  Neck: Normal range  of motion. Neck supple.  Cardiovascular: Normal rate, regular rhythm, S1 normal and S2 normal.  No murmur heard. Pulmonary/Chest: Effort normal and breath sounds normal. She has no wheezes. She has no rales.  Abdominal: Soft. She exhibits no distension. There is no tenderness. There is no guarding.  Musculoskeletal: Normal range of motion. She exhibits no edema or deformity.  Lymphadenopathy:    She has no cervical adenopathy.  Neurological: She is alert.  Patient moves extremities symmetrically and with good coordination.Cranial nerves grossly intact.   Skin: Skin is warm and dry. There is erythema.  There is a wound VAC in place in the left medial thigh.  There is erythema extending down to the medial thigh to the knee.  There is no fluctuance.  There is induration just superior  Patient has vascular access with AV fistula in the left lateral thigh superior to the wound VAC.  No surrounding erythema.  Psychiatric: She has a normal mood and affect. Her behavior is normal. Judgment and thought content normal.  Nursing note and vitals reviewed.    ED Treatments / Results  Labs (all labs ordered are listed, but only abnormal results are displayed) Labs Reviewed  CULTURE, BLOOD (ROUTINE X 2)  CULTURE, BLOOD (ROUTINE X 2)  BASIC METABOLIC PANEL  CBC WITH DIFFERENTIAL/PLATELET    EKG  EKG Interpretation None       Radiology No results found.  Procedures Procedures (including critical care time)  EMERGENCY DEPARTMENT US SOFT TISSUE INTERPRETATION "Study: Limited Soft Tissue Ultrasound"  INDICATIONS: Soft tissue infection Multiple views of the body part were obtained in real-time with a multi-frequency linear probe PERFORMED BY:  Myself IMAGES ARCHIVED?: Yes SIDE:Left BODY PART:Lower extremity FINDINGS: Cellulitis present and Other Complex fluid collection approximately 1-1.5 cm deep to the cobblestoning. INTERPRETATION:  Cellulitis present   CPT: Neck  [id number]  Upper extremity 97026-37  Axilla 85885-02  Chest wall 77412-87  Beast 86767-20  Upper back 94709-62  Lower back 83662-94  Abdominal wall 76546-50  Pelvic wall 35465-68  Lower extremity 12751-70  Other soft tissue 01749-44   Medications Ordered in ED Medications - No data to display   Initial Impression / Assessment and Plan / ED Course  I have reviewed the triage vital signs  and the nursing notes.  Pertinent labs & imaging results that were available during my care of the patient were reviewed by me and considered in my medical decision making (see chart for details).     Final Clinical Impressions(s) / ED Diagnoses   Final diagnoses:  Cellulitis of left lower extremity   Patient is nontoxic-appearing, afebrile, and in no acute distress.  Vital signs are normal.  Patient is non-tachycardic.  Patient exhibits extensive cellulitis just distal to her wound VAC.  Patient has a history of a complex wound requiring surgical debridement at the place of the wound VAC.  This is chronic times several months.  Patient is immunocompromised on dialysis and is a diabetic.  Will obtain CBC and BMP in trouble cultures.  Patient demonstrates a slight leukocytosis of 13.7.  Creatinine 6.94 and BUN 39.  Spoke with Dr. Bridgett Larsson of vascular surgery, who would like the patient admitted to medicine and vascular surgical consult in the morning.  Discussed that source of cellulitis is unlikely due to hematoma evacuation in December 2018.    Antibiotic Zosyn selected per discussion with Dr. Orlie Dakin.  Spoke with Dr. Blaine Hamper of [facility] who will admit the patient.  ED Discharge Orders    None       Tamala Julian 07/12/17 0239    Orlie Dakin, MD 07/13/17 [phone]

## 2017-07-11 NOTE — ED Provider Notes (Signed)
Complaint of left leg pain and redness.  For approximate the past 10 days.  Patient has a wound VAC medial thigh for hematoma.  There is surrounding redness proximally distally to the wound VAC with corresponding tenderness.   Orlie Dakin, MD 07/12/17 [phone]

## 2017-07-11 NOTE — H&P (Addendum)
History and Physical    Ashlee Miles IBB:[id number] DOB: 1940/10/27 DOA: 07/11/2017  Referring MD/NP/PA:   PCP: Darlina Rumpf, MD   Patient coming from:  The patient is coming from home.  At baseline, pt is partially dependent for most of ADL.    Chief Complaint: increased pain and erythema in the left upper leg  HPI: Ashlee Miles is a 77 y.o. 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 Patient reports that she has a wound care nurse coming out once a week to examine the wound VAC. Pt states that she has increased pain in leg upper leg. The wound care nurse noticed that interim over 1 week there was increased erythema spreading on the thigh. No increasing drainage from the wound VAC. Patient does not have fever or chills. Patient denies chest pain, shortness breath, cough. No GI symptoms. Denies symptoms of UTI or unilateral weakness. Patient states that she has been compliant to dialysis. She had dialysis yesterday.  ED Course: pt was found to have WBC 13.7, INR 2.29, potassium 4.0, bicarbonate 22, creatinine 6.94, BUN 39, temperature normal, no tachycardia, no tachypnea, oxygen saturation 99% on room air. Patient is admitted to telemetry bed as inpatient. Vascular surgeon, Dr. Bridgett Larsson was consulted.  Review of Systems:   General: no fevers, chills, no body weight gain, has fatigue HEENT: no blurry vision, hearing changes or sore throat Respiratory: no dyspnea, coughing, wheezing CV: no chest pain, no palpitations GI: no nausea, vomiting, abdominal pain, diarrhea, constipation GU: no dysuria, burning on urination, increased urinary  frequency, hematuria  Ext: no leg edema Neuro: no unilateral weakness, numbness, or tingling, no vision change or hearing loss Skin: pt has a wound in the left thigh with VAC in place, has tenderness in the surrounding area. MSK: No muscle spasm, no deformity, no limitation of range of movement in spin Heme: No easy bruising.  Travel history: No recent long distant travel.  Allergy:  Allergies  Allergen Reactions  . Other     Past Medical History:  Diagnosis Date  . CHF (congestive heart failure) ([place])   . Diabetes mellitus without complication ([place])   . ESRD (end stage renal disease) on dialysis ([place])   . History of pituitary adenoma   . Hypertension   . Protein C deficiency [facility])     Past Surgical History:  Procedure Laterality Date  . AV FISTULA PLACEMENT Left 03/16/2017   Procedure: INSERTION OF ARTERIOVENOUS (AV) GORE-TEX GRAFT THIGH-LEFT;  Surgeon: Angelia Mould, MD;  Location: [place];  Service: Vascular;  Laterality: Left;  . CHOLECYSTECTOMY    . DIALYSIS FISTULA CREATION     clotted off  . FALSE ANEURYSM REPAIR Left 05/10/2017   Procedure: REPAIR FALSE ANEURYSM left thigh AVGG using Gore 55mx5cm Viabahn Endoprosthesis;  Surgeon: BSerafina Mitchell MD;  [facility];  Service: Vascular;  Laterality: Left;  . INSERTION OF DIALYSIS CATHETER Right 03/16/2017   Procedure: INSERTION OF RIGHT FEMORAL TUNNELED DIALYSIS CATHETER;  Surgeon: DAngelia Mould MD;  [facility];  Service: Vascular;  Laterality: Right;  . IR THROMBECTOMY AV FISTULA W/THROMBOLYSIS/PTA INC/SHUNT/IMG LEFT Left 06/22/2017  . IR [place] V[place]  ACCESS LEFT  06/22/2017  . IVC FILTER INSERTION    . PORTACATH PLACEMENT    . THROMBECTOMY AND REVISION OF ARTERIOVENTOUS (AV) GORETEX  GRAFT Left 06/02/2017   Procedure: EVACUATION OF LEFT THIGH HEMATOMA  AND APPLICATION OF WOUND VAC;  Surgeon: Serafina Mitchell, MD;  Location: [place];  Service: Vascular;  Laterality: Left;    Social History:   reports that  has never smoked. she has never used smokeless tobacco. She reports that she does not drink alcohol or use drugs.  Family History:  Family History  Problem Relation Age of Onset  . Breast cancer Mother   . Diabetes Sister   . Diabetes Brother   . CAD Other   . Stroke Neg Hx      Prior to Admission medications   Medication Sig Start Date End Date Taking? Authorizing Provider  Amino Acids-Protein Hydrolys (FEEDING SUPPLEMENT, PRO-STAT SUGAR FREE 64,) LIQD Take 30 mLs by mouth 2 (two) times daily. 04/12/17   Raiford Noble Latif, DO  calcitRIOL (ROCALTROL) 0.5 MCG capsule Take 0.5 mcg by mouth daily.    [provider]  carvedilol (COREG) 6.25 MG tablet Take 1 tablet (6.25 mg total) by mouth 2 (two) times daily. 03/04/17 03/04/18  Hosie Poisson, MD  docusate sodium (COLACE) 100 MG capsule Take 1 capsule (100 mg total) by mouth 2 (two) times daily. 04/12/17   Raiford Noble Latif, DO  famotidine (PEPCID) 20 MG tablet Take 1 tablet (20 mg total) daily by mouth. 04/28/17   Lavina Hamman, MD  gabapentin (NEURONTIN) 300 MG capsule Take 1 capsule (300 mg total) by mouth at bedtime. 06/09/17   Dessa Phi, DO  insulin aspart (NOVOLOG) 100 UNIT/ML injection Inject 0-9 Units into the skin 3 (three) times daily with meals. 03/22/17   Debbe Odea, MD  insulin glargine (LANTUS) 100 UNIT/ML injection Inject 0.03 mLs (3 Units total) into the skin at bedtime. 06/09/17   Dessa Phi, DO  ipratropium-albuterol (DUONEB) 0.5-2.5 (3) MG/3ML SOLN Take 3 mLs by nebulization every 4 (four) hours as needed. 04/12/17   Raiford Noble Latif, DO  linaclotide (LINZESS) 145 MCG CAPS capsule Take 145 mcg by mouth daily as needed.     [provider]  midodrine (PROAMATINE) 10 MG tablet Take 1 tablet (10 mg total) by mouth every Monday, Wednesday, and Friday with hemodialysis. 04/14/17   Raiford Noble Latif, DO  multivitamin (RENA-VIT) TABS tablet Take 1 tablet by mouth at bedtime. 04/12/17    Sheikh, Omair Latif, DO  pantoprazole (PROTONIX) 40 MG tablet Take 1 tablet (40 mg total) 2 (two) times daily before a meal by mouth. 04/28/17   Lavina Hamman, MD  PARoxetine (PAXIL) 10 MG tablet Take 1 tablet (10 mg total) daily by mouth. 04/28/17   Lavina Hamman, MD  polyethylene glycol [facility] / Floria Raveling) packet Take 17 g daily by mouth. 04/28/17   Lavina Hamman, MD  simvastatin (ZOCOR) 40 MG tablet Take 40 mg by mouth daily at 6 PM.    [provider]  sucralfate (CARAFATE) 1 GM/10ML suspension Take 10 mLs (1 g total) 4 (four) times daily -  with meals and at bedtime by mouth. 04/28/17   Lavina Hamman, MD  thiamine (VITAMIN B-1) 100 MG tablet Take 1 tablet (100 mg total) by mouth daily. 06/09/17   Dessa Phi, DO  traMADol (ULTRAM) 50 MG tablet Take 1 tablet (50 mg total) by mouth every 12 (twelve) hours as needed. 06/29/17   Ricard Dillon,  Alyson Locket, PA-C  triamcinolone cream (KENALOG) 0.1 % Apply 1 application topically every 12 (twelve) hours as needed.     [provider]  warfarin (COUMADIN) 5 MG tablet Take 5 mg by mouth daily at 6 PM.    [provider]    Physical Exam: Vitals:   07/12/17 0144 07/12/17 0200 07/12/17 0230 07/12/17 0300  BP: (!) 127/47 (!) 116/44 (!) 108/40 (!) 101/37  Pulse: 67 70 65 64  Resp: 13 14 13 15   Temp:      TempSrc:      SpO2: 99% 100% 99% 99%   General: Not in acute distress HEENT:       Eyes: PERRL, EOMI, no scleral icterus.       ENT: No discharge from the ears and nose, no pharynx injection, no tonsillar enlargement.        Neck: No JVD, no bruit, no mass felt. Heme: No neck lymph node enlargement. Cardiac: S1/S2, RRR, No murmurs, No gallops or rubs. Respiratory: No rales, wheezing, rhonchi or rubs. GI: Soft, nondistended, nontender, no rebound pain, no organomegaly, BS present. GU: No hematuria Ext: No pitting leg edema bilaterally. 2+DP/PT pulse bilaterally. Musculoskeletal: No joint deformities, No joint redness  or warmth, no limitation of ROM in spin. Skin:  pt has a wound in the left thigh with VAC in place, has tenderness, erythema and warmth in the surrounding area. Neuro: Alert, oriented X3, cranial nerves II-XII grossly intact, moves all extremities normally.  Psych: Patient is not psychotic, no suicidal or hemocidal ideation.  Labs on Admission: I have personally reviewed following labs and imaging studies  CBC: Recent Labs  Lab 07/11/17 2227  WBC 13.7*  NEUTROABS 10.7*  HGB 11.0*  HCT 34.4*  MCV 82.9  PLT 670   Basic Metabolic Panel: Recent Labs  Lab 07/11/17 2227  NA 135  K 4.0  CL 98*  CO2 22  GLUCOSE 239*  BUN 39*  CREATININE 6.94*  CALCIUM 8.1*   GFR: Estimated Creatinine Clearance: 7.2 mL/min (A) (by C-G formula based on SCr of 6.94 mg/dL (H)). Liver Function Tests: No results for input(s): AST, ALT, ALKPHOS, BILITOT, PROT, ALBUMIN in the last 168 hours. No results for input(s): LIPASE, AMYLASE in the last 168 hours. No results for input(s): AMMONIA in the last 168 hours. Coagulation Profile: Recent Labs  Lab 07/12/17 0127  INR 2.58   Cardiac Enzymes: No results for input(s): CKTOTAL, CKMB, CKMBINDEX, TROPONINI in the last 168 hours. BNP (last 3 results) No results for input(s): PROBNP in the last 8760 hours. HbA1C: No results for input(s): HGBA1C in the last 72 hours. CBG: No results for input(s): GLUCAP in the last 168 hours. Lipid Profile: No results for input(s): CHOL, HDL, LDLCALC, TRIG, CHOLHDL, LDLDIRECT in the last 72 hours. Thyroid Function Tests: No results for input(s): TSH, T4TOTAL, FREET4, T3FREE, THYROIDAB in the last 72 hours. Anemia Panel: No results for input(s): VITAMINB12, FOLATE, FERRITIN, TIBC, IRON, RETICCTPCT in the last 72 hours. Urine analysis:    Component Value Date/Time   COLORURINE AMBER (A) 06/06/2017 1636   APPEARANCEUR CLOUDY (A) 06/06/2017 1636   LABSPEC 1.018 06/06/2017 1636   PHURINE 5.0 06/06/2017 1636   GLUCOSEU  NEGATIVE 06/06/2017 1636   HGBUR SMALL (A) 06/06/2017 1636   BILIRUBINUR NEGATIVE 06/06/2017 1636   KETONESUR NEGATIVE 06/06/2017 1636   PROTEINUR 100 (A) 06/06/2017 1636   NITRITE NEGATIVE 06/06/2017 1636   LEUKOCYTESUR NEGATIVE 06/06/2017 1636   Sepsis Labs: @LABRCNTIP (procalcitonin:4,lacticidven:4) )No results found  for this or any previous visit (from the past 240 hour(s)).   Radiological Exams on Admission: Ct Extremity Lower Left Wo Contrast  Result Date: 07/12/2017 CLINICAL DATA:  Wound in the left thigh EXAM: CT OF THE LOWER LEFT EXTREMITY WITHOUT CONTRAST TECHNIQUE: Multidetector CT imaging of the lower left extremity was performed according to the standard protocol. COMPARISON:  None. FINDINGS: Bones/Joint/Cartilage Mild SI joint disease. Left pubic symphysis and rami appears intact. The left femur demonstrates no fracture or malalignment. No abnormal lucency, periostitis or bone destruction. Mild lateral subluxation of the patella with patellofemoral degenerative changes. Moderate arthritis of the medial and lateral compartments of the left knee. No large left hip effusion or knee effusion. Loop graft in the left thigh with anastomosis at the left femoral vessels. Ligaments Suboptimally assessed by CT. Muscles and Tendons Quadriceps tendon and patellar tendons appear intact. No intramuscular fluid collections are seen. Slightly indistinct appearance of the distal medial vastus muscle. Soft tissues Skin thickening and moderate subcutaneous edema and soft tissue stranding involving the medial aspect of the mid to distal thigh. Wound within the medial thigh soft tissues with small foci of gas and radiopaque material, likely packing material. Dominant fluid collection within the medial subcutaneous fat of the distal thigh just above the knee, this measures 8.2 x 2.9 cm. Vascular calcifications. IMPRESSION: 1. Deep wound within the soft tissues of the mid to distal medial thigh. Skin thickening  and moderate edema and inflammation of the subcutaneous fat of the mid to distal medial thigh consistent with cellulitis. Indistinct appearance of the distal medial vastus muscle likely representing muscle edema/myositis. Dominant fluid collection within the distal thigh subcutaneous fat, just above the knee measuring 8.2 cm which may represent an inflammatory or infected fluid collection. No bone destruction, periostitis or abnormal lucency to suggest osteomyelitis by CT. 2. Arthritis of the left knee with mild lateral subluxation of the patella Electronically Signed   By: Donavan Foil M.D.   On: 07/12/2017 01:28     EKG:  Not done in ED, will get one.   Assessment/Plan Principal Problem:   Cellulitis of left lower extremity Active Problems:   Essential hypertension   DVT (deep venous thrombosis) (HCC)   ESRD on dialysis ([place])   Anemia due to chronic kidney disease   Diabetes mellitus with end stage renal disease (HCC)   GERD (gastroesophageal reflux disease)   Protein C deficiency (HCC)   Leg wound, left   HLD (hyperlipidemia)   Cellulitis of left lower extremity in the inner thigh area and wound with VAC: VVS,dr. Bridgett Larsson was consulted-->Recommended to get CT L leg to evaluate for any pockets of fluid or air. Patient does not meet crtei or sepsis.  - will admit to tele bed as inpt - Empiric antimicrobial treatment with vancomycin  - PRN Zofran for nausea, Percocet for pain - Blood cultures x 2  - ESR and CRP - Ct-scan of left upper leg - will get Procalcitonin and trend lactic acid levels   DVT (deep venous thrombosis) and Protein C deficiency -continue coumadin per pharm  HTN:  -Continue home medications: Coreg -IV hydralazine prn  Anemia due to chronic kidney disease:  Hgb stable, 11.0 -f/u by CBC  GERD: -Protonix and Pepcid  Diabetes mellitus with end stage renal disease ([place]): Last A1c , 7.3, not well controled. Patient is taking Lantus at home --continue Lantus, 3 U  daily -SSI  HLD: -zocor  ESRD (end stage renal disease) on dialysis (MWF): potassium 4.0,  bicarbonate 22, creatinine 6.94, BUN 39 -Left message to renal box for HD   DVT ppx: on Coumadin Code Status: DNR (pt has yellow paper documentation, indicating DO NOT RESUSCITATE) Family Communication: None at bed side.      Disposition Plan:  Anticipate discharge back to previous home environment Consults called:  VVS, Dr. Bridgett Larsson Admission status: Inpatient/tele    Date of Service 07/12/2017    Ivor Costa [facility] Pager [phone]  If 7PM-7AM, please contact night-coverage [email or web address] Password [email or web address] 07/12/2017, 3:11 AM

## 2017-07-11 NOTE — ED Notes (Signed)
ED Provider at bedside. 

## 2017-07-11 NOTE — ED Triage Notes (Signed)
Per [facility], Pt from home. Pt has wound to Left upper inner thigh with wound vac in place. Wound vac appears to be properly functioning. Pt was seen by home health nurse today, who sent her here. Pt has redness and warmth to thigh surrounding wound, goes past the knee. Pt complaining of pain to the area.

## 2017-07-12 ENCOUNTER — Inpatient Hospital Stay (HOSPITAL_COMMUNITY): Payer: 59

## 2017-07-12 DIAGNOSIS — L03116 Cellulitis of left lower limb: Secondary | ICD-10-CM | POA: Diagnosis present

## 2017-07-12 DIAGNOSIS — Z79899 Other long term (current) drug therapy: Secondary | ICD-10-CM | POA: Diagnosis not present

## 2017-07-12 DIAGNOSIS — I825Y9 Chronic embolism and thrombosis of unspecified deep veins of unspecified proximal lower extremity: Secondary | ICD-10-CM | POA: Diagnosis not present

## 2017-07-12 DIAGNOSIS — L02416 Cutaneous abscess of left lower limb: Secondary | ICD-10-CM | POA: Diagnosis present

## 2017-07-12 DIAGNOSIS — Z8249 Family history of ischemic heart disease and other diseases of the circulatory system: Secondary | ICD-10-CM | POA: Diagnosis not present

## 2017-07-12 DIAGNOSIS — Z992 Dependence on renal dialysis: Secondary | ICD-10-CM

## 2017-07-12 DIAGNOSIS — I10 Essential (primary) hypertension: Secondary | ICD-10-CM | POA: Diagnosis not present

## 2017-07-12 DIAGNOSIS — N186 End stage renal disease: Secondary | ICD-10-CM | POA: Diagnosis present

## 2017-07-12 DIAGNOSIS — S81802A Unspecified open wound, left lower leg, initial encounter: Secondary | ICD-10-CM | POA: Diagnosis present

## 2017-07-12 DIAGNOSIS — Z23 Encounter for immunization: Secondary | ICD-10-CM | POA: Diagnosis present

## 2017-07-12 DIAGNOSIS — Z794 Long term (current) use of insulin: Secondary | ICD-10-CM | POA: Diagnosis not present

## 2017-07-12 DIAGNOSIS — K219 Gastro-esophageal reflux disease without esophagitis: Secondary | ICD-10-CM | POA: Diagnosis present

## 2017-07-12 DIAGNOSIS — E1122 Type 2 diabetes mellitus with diabetic chronic kidney disease: Secondary | ICD-10-CM | POA: Diagnosis present

## 2017-07-12 DIAGNOSIS — N2581 Secondary hyperparathyroidism of renal origin: Secondary | ICD-10-CM | POA: Diagnosis present

## 2017-07-12 DIAGNOSIS — Z7901 Long term (current) use of anticoagulants: Secondary | ICD-10-CM | POA: Diagnosis not present

## 2017-07-12 DIAGNOSIS — D6859 Other primary thrombophilia: Secondary | ICD-10-CM | POA: Diagnosis present

## 2017-07-12 DIAGNOSIS — Z66 Do not resuscitate: Secondary | ICD-10-CM | POA: Diagnosis present

## 2017-07-12 DIAGNOSIS — Z7952 Long term (current) use of systemic steroids: Secondary | ICD-10-CM | POA: Diagnosis not present

## 2017-07-12 DIAGNOSIS — Y838 Other surgical procedures as the cause of abnormal reaction of the patient, or of later complication, without mention of misadventure at the time of the procedure: Secondary | ICD-10-CM | POA: Diagnosis present

## 2017-07-12 DIAGNOSIS — D631 Anemia in chronic kidney disease: Secondary | ICD-10-CM

## 2017-07-12 DIAGNOSIS — X58XXXA Exposure to other specified factors, initial encounter: Secondary | ICD-10-CM | POA: Diagnosis present

## 2017-07-12 DIAGNOSIS — Z803 Family history of malignant neoplasm of breast: Secondary | ICD-10-CM | POA: Diagnosis not present

## 2017-07-12 DIAGNOSIS — Z86718 Personal history of other venous thrombosis and embolism: Secondary | ICD-10-CM | POA: Diagnosis not present

## 2017-07-12 DIAGNOSIS — E785 Hyperlipidemia, unspecified: Secondary | ICD-10-CM | POA: Diagnosis present

## 2017-07-12 DIAGNOSIS — Z79891 Long term (current) use of opiate analgesic: Secondary | ICD-10-CM | POA: Diagnosis not present

## 2017-07-12 DIAGNOSIS — W19XXXD Unspecified fall, subsequent encounter: Secondary | ICD-10-CM | POA: Diagnosis present

## 2017-07-12 DIAGNOSIS — I132 Hypertensive heart and chronic kidney disease with heart failure and with stage 5 chronic kidney disease, or end stage renal disease: Secondary | ICD-10-CM | POA: Diagnosis present

## 2017-07-12 DIAGNOSIS — T8149XA Infection following a procedure, other surgical site, initial encounter: Secondary | ICD-10-CM | POA: Diagnosis present

## 2017-07-12 DIAGNOSIS — Z833 Family history of diabetes mellitus: Secondary | ICD-10-CM | POA: Diagnosis not present

## 2017-07-12 DIAGNOSIS — I5032 Chronic diastolic (congestive) heart failure: Secondary | ICD-10-CM | POA: Diagnosis present

## 2017-07-12 LAB — CBC
HCT: 32.8 % — ABNORMAL LOW (ref 36.0–46.0)
Hemoglobin: 10.4 g/dL — ABNORMAL LOW (ref 12.0–15.0)
MCH: 25.9 pg — ABNORMAL LOW (ref 26.0–34.0)
MCHC: 31.7 g/dL (ref 30.0–36.0)
MCV: 81.6 fL (ref 78.0–100.0)
Platelets: 118 10*3/uL — ABNORMAL LOW (ref 150–400)
RBC: 4.02 MIL/uL (ref 3.87–5.11)
RDW: 17.5 % — ABNORMAL HIGH (ref 11.5–15.5)
WBC: 11.7 10*3/uL — ABNORMAL HIGH (ref 4.0–10.5)

## 2017-07-12 LAB — BASIC METABOLIC PANEL
Anion gap: 13 (ref 5–15)
BUN: 41 mg/dL — ABNORMAL HIGH (ref 6–20)
CO2: 25 mmol/L (ref 22–32)
Calcium: 8 mg/dL — ABNORMAL LOW (ref 8.9–10.3)
Chloride: 99 mmol/L — ABNORMAL LOW (ref 101–111)
Creatinine, Ser: 7.74 mg/dL — ABNORMAL HIGH (ref 0.44–1.00)
GFR calc Af Amer: 5 mL/min — ABNORMAL LOW (ref >60)
GFR calc non Af Amer: 4 mL/min — ABNORMAL LOW (ref >60)
Glucose, Bld: 86 mg/dL (ref 65–99)
Potassium: 4.2 mmol/L (ref 3.5–5.1)
Sodium: 137 mmol/L (ref 135–145)

## 2017-07-12 LAB — GLUCOSE, CAPILLARY
Glucose-Capillary: 174 mg/dL — ABNORMAL HIGH (ref 65–99)
Glucose-Capillary: 147 mg/dL — ABNORMAL HIGH (ref 65–99)

## 2017-07-12 LAB — LACTIC ACID, PLASMA
Lactic Acid, Venous: 1.1 mmol/L (ref 0.5–1.9)
Lactic Acid, Venous: 0.8 mmol/L (ref 0.5–1.9)

## 2017-07-12 LAB — C-REACTIVE PROTEIN: CRP: 20.1 mg/dL — ABNORMAL HIGH (ref <1.0)

## 2017-07-12 LAB — PROTIME-INR
INR: 2.58
Prothrombin Time: 27.5 seconds — ABNORMAL HIGH (ref 11.4–15.2)

## 2017-07-12 LAB — SEDIMENTATION RATE: Sed Rate: 30 mm/hr — ABNORMAL HIGH (ref 0–22)

## 2017-07-12 LAB — CBG MONITORING, ED: Glucose-Capillary: 88 mg/dL (ref 65–99)

## 2017-07-12 LAB — PROCALCITONIN: Procalcitonin: 8.26 ng/mL

## 2017-07-12 MED ORDER — PIPERACILLIN-TAZOBACTAM 3.375 G IVPB
3.3750 g | Freq: Two times a day (BID) | INTRAVENOUS | Status: DC
  Administered 2017-07-12 – 2017-07-16 (×8): 3.375 g via INTRAVENOUS
  Filled 2017-07-12 (×9): qty 50

## 2017-07-12 MED ORDER — DOXERCALCIFEROL 4 MCG/2ML IV SOLN
6.0000 ug | INTRAVENOUS | Status: DC
  Administered 2017-07-14 – 2017-07-21 (×4): 6 ug via INTRAVENOUS
  Filled 2017-07-12 (×4): qty 4

## 2017-07-12 MED ORDER — ONDANSETRON HCL 4 MG PO TABS: 4.0000 mg | ORAL_TABLET | Freq: Four times a day (QID) | ORAL | Status: DC | PRN

## 2017-07-12 MED ORDER — POLYETHYLENE GLYCOL 3350 17 G PO PACK
17.0000 g | PACK | Freq: Every day | ORAL | Status: DC
  Administered 2017-07-15 – 2017-07-20 (×3): 17 g via ORAL
  Filled 2017-07-12 (×7): qty 1

## 2017-07-12 MED ORDER — SENNOSIDES-DOCUSATE SODIUM 8.6-50 MG PO TABS: 1.0000 | ORAL_TABLET | Freq: Every evening | ORAL | Status: DC | PRN

## 2017-07-12 MED ORDER — ZOLPIDEM TARTRATE 5 MG PO TABS
5.0000 mg | ORAL_TABLET | Freq: Every evening | ORAL | Status: DC | PRN
  Administered 2017-07-15 – 2017-07-18 (×3): 5 mg via ORAL
  Filled 2017-07-12 (×3): qty 1

## 2017-07-12 MED ORDER — RENA-VITE PO TABS
1.0000 | ORAL_TABLET | Freq: Every day | ORAL | Status: DC
  Administered 2017-07-13 – 2017-07-20 (×9): 1 via ORAL
  Filled 2017-07-12 (×9): qty 1

## 2017-07-12 MED ORDER — LINACLOTIDE 145 MCG PO CAPS: 145.0000 ug | ORAL_CAPSULE | Freq: Every day | ORAL | Status: DC | PRN

## 2017-07-12 MED ORDER — WARFARIN - PHARMACIST DOSING INPATIENT: Freq: Every day | Status: DC

## 2017-07-12 MED ORDER — GABAPENTIN 300 MG PO CAPS
300.0000 mg | ORAL_CAPSULE | Freq: Every day | ORAL | Status: DC
  Administered 2017-07-13 – 2017-07-18 (×7): 300 mg via ORAL
  Filled 2017-07-12 (×7): qty 1

## 2017-07-12 MED ORDER — CARVEDILOL 6.25 MG PO TABS
6.2500 mg | ORAL_TABLET | Freq: Two times a day (BID) | ORAL | Status: DC
  Administered 2017-07-12 – 2017-07-13 (×4): 6.25 mg via ORAL
  Filled 2017-07-12 (×4): qty 1

## 2017-07-12 MED ORDER — ACETAMINOPHEN 325 MG PO TABS: 650.0000 mg | ORAL_TABLET | Freq: Four times a day (QID) | ORAL | Status: DC | PRN

## 2017-07-12 MED ORDER — WARFARIN SODIUM 5 MG PO TABS
5.0000 mg | ORAL_TABLET | Freq: Once | ORAL | Status: AC
  Administered 2017-07-12: 5 mg via ORAL
  Filled 2017-07-12 (×2): qty 1

## 2017-07-12 MED ORDER — INSULIN ASPART 100 UNIT/ML ~~LOC~~ SOLN
0.0000 [IU] | Freq: Three times a day (TID) | SUBCUTANEOUS | Status: DC
Start: 2017-07-12 — End: 2017-07-21
  Administered 2017-07-12: 2 [IU] via SUBCUTANEOUS
  Administered 2017-07-12: 1 [IU] via SUBCUTANEOUS
  Administered 2017-07-13 (×2): 2 [IU] via SUBCUTANEOUS
  Administered 2017-07-15: 1 [IU] via SUBCUTANEOUS
  Administered 2017-07-15: 2 [IU] via SUBCUTANEOUS
  Administered 2017-07-15 – 2017-07-16 (×2): 1 [IU] via SUBCUTANEOUS
  Administered 2017-07-16 – 2017-07-17 (×2): 2 [IU] via SUBCUTANEOUS
  Administered 2017-07-17 – 2017-07-18 (×2): 3 [IU] via SUBCUTANEOUS
  Administered 2017-07-18: 1 [IU] via SUBCUTANEOUS
  Administered 2017-07-19 – 2017-07-20 (×2): 3 [IU] via SUBCUTANEOUS
  Administered 2017-07-20: 1 [IU] via SUBCUTANEOUS
  Administered 2017-07-20: 2 [IU] via SUBCUTANEOUS

## 2017-07-12 MED ORDER — SEVELAMER CARBONATE 800 MG PO TABS
800.0000 mg | ORAL_TABLET | Freq: Three times a day (TID) | ORAL | Status: DC
  Administered 2017-07-13 – 2017-07-16 (×8): 800 mg via ORAL
  Filled 2017-07-12 (×8): qty 1

## 2017-07-12 MED ORDER — MIDODRINE HCL 5 MG PO TABS
10.0000 mg | ORAL_TABLET | ORAL | Status: DC
  Administered 2017-07-17 – 2017-07-19 (×2): 10 mg via ORAL
  Filled 2017-07-12 (×2): qty 2

## 2017-07-12 MED ORDER — HYDRALAZINE HCL 20 MG/ML IJ SOLN: 5.0000 mg | INTRAMUSCULAR | Status: DC | PRN

## 2017-07-12 MED ORDER — PANTOPRAZOLE SODIUM 40 MG PO TBEC
40.0000 mg | DELAYED_RELEASE_TABLET | Freq: Two times a day (BID) | ORAL | Status: DC
  Administered 2017-07-12 – 2017-07-20 (×15): 40 mg via ORAL
  Filled 2017-07-12 (×15): qty 1

## 2017-07-12 MED ORDER — ONDANSETRON HCL 4 MG/2ML IJ SOLN: 4.0000 mg | Freq: Four times a day (QID) | INTRAMUSCULAR | Status: DC | PRN

## 2017-07-12 MED ORDER — VANCOMYCIN HCL IN DEXTROSE 750-5 MG/150ML-% IV SOLN
750.0000 mg | INTRAVENOUS | Status: DC
  Administered 2017-07-14: 750 mg via INTRAVENOUS
  Filled 2017-07-12 (×2): qty 150

## 2017-07-12 MED ORDER — DOCUSATE SODIUM 100 MG PO CAPS
100.0000 mg | ORAL_CAPSULE | Freq: Two times a day (BID) | ORAL | Status: DC
  Administered 2017-07-12 – 2017-07-20 (×11): 100 mg via ORAL
  Filled 2017-07-12 (×16): qty 1

## 2017-07-12 MED ORDER — VITAMIN B-1 100 MG PO TABS
100.0000 mg | ORAL_TABLET | Freq: Every day | ORAL | Status: DC
  Administered 2017-07-12 – 2017-07-21 (×9): 100 mg via ORAL
  Filled 2017-07-12 (×9): qty 1

## 2017-07-12 MED ORDER — INSULIN GLARGINE 100 UNIT/ML ~~LOC~~ SOLN
3.0000 [IU] | Freq: Every day | SUBCUTANEOUS | Status: DC
  Administered 2017-07-12 – 2017-07-20 (×10): 3 [IU] via SUBCUTANEOUS
  Filled 2017-07-12 (×11): qty 0.03

## 2017-07-12 MED ORDER — CALCITRIOL 0.25 MCG PO CAPS
0.5000 ug | ORAL_CAPSULE | Freq: Every day | ORAL | Status: DC
  Administered 2017-07-12: 0.5 ug via ORAL
  Filled 2017-07-12: qty 2
  Filled 2017-07-12: qty 1

## 2017-07-12 MED ORDER — PRO-STAT SUGAR FREE PO LIQD
30.0000 mL | Freq: Two times a day (BID) | ORAL | Status: DC
  Administered 2017-07-13 – 2017-07-20 (×9): 30 mL via ORAL
  Filled 2017-07-12 (×17): qty 30

## 2017-07-12 MED ORDER — OXYCODONE-ACETAMINOPHEN 5-325 MG PO TABS
1.0000 | ORAL_TABLET | ORAL | Status: DC | PRN
  Administered 2017-07-12 – 2017-07-16 (×7): 1 via ORAL
  Filled 2017-07-12 (×7): qty 1

## 2017-07-12 MED ORDER — SUCRALFATE 1 GM/10ML PO SUSP
1.0000 g | Freq: Three times a day (TID) | ORAL | Status: DC
  Administered 2017-07-12 – 2017-07-21 (×30): 1 g via ORAL
  Filled 2017-07-12 (×32): qty 10

## 2017-07-12 MED ORDER — VANCOMYCIN HCL 10 G IV SOLR
1500.0000 mg | Freq: Once | INTRAVENOUS | Status: AC
  Administered 2017-07-12: 1500 mg via INTRAVENOUS
  Filled 2017-07-12: qty 1500

## 2017-07-12 MED ORDER — ADULT MULTIVITAMIN W/MINERALS CH: 1.0000 | ORAL_TABLET | Freq: Every day | ORAL | Status: DC

## 2017-07-12 MED ORDER — COLLAGENASE 250 UNIT/GM EX OINT
TOPICAL_OINTMENT | Freq: Two times a day (BID) | CUTANEOUS | Status: DC
  Administered 2017-07-12 – 2017-07-21 (×12): via TOPICAL
  Filled 2017-07-12: qty 30

## 2017-07-12 MED ORDER — SIMVASTATIN 40 MG PO TABS
40.0000 mg | ORAL_TABLET | Freq: Every day | ORAL | Status: DC
  Administered 2017-07-12 – 2017-07-20 (×9): 40 mg via ORAL
  Filled 2017-07-12 (×11): qty 1

## 2017-07-12 MED ORDER — CINACALCET HCL 30 MG PO TABS
30.0000 mg | ORAL_TABLET | Freq: Every day | ORAL | Status: DC
  Administered 2017-07-13 – 2017-07-20 (×8): 30 mg via ORAL
  Filled 2017-07-12 (×9): qty 1

## 2017-07-12 MED ORDER — TRIAMCINOLONE ACETONIDE 0.1 % EX CREA: 1.0000 "application " | TOPICAL_CREAM | Freq: Two times a day (BID) | CUTANEOUS | Status: DC | PRN

## 2017-07-12 MED ORDER — FAMOTIDINE 20 MG PO TABS
20.0000 mg | ORAL_TABLET | Freq: Every day | ORAL | Status: DC
  Administered 2017-07-12 – 2017-07-21 (×9): 20 mg via ORAL
  Filled 2017-07-12 (×9): qty 1

## 2017-07-12 MED ORDER — IPRATROPIUM-ALBUTEROL 0.5-2.5 (3) MG/3ML IN SOLN: 3.0000 mL | RESPIRATORY_TRACT | Status: DC | PRN

## 2017-07-12 MED ORDER — ACETAMINOPHEN 650 MG RE SUPP: 650.0000 mg | Freq: Four times a day (QID) | RECTAL | Status: DC | PRN

## 2017-07-12 MED ORDER — PAROXETINE HCL 10 MG PO TABS
10.0000 mg | ORAL_TABLET | Freq: Every day | ORAL | Status: DC
  Administered 2017-07-12 – 2017-07-21 (×9): 10 mg via ORAL
  Filled 2017-07-12 (×11): qty 1

## 2017-07-12 NOTE — Progress Notes (Signed)
[facility] PROGRESS NOTE  Idy Rawling IBB:[id number] DOB: [birth year] DOA: 07/11/2017  PCP: Darlina Rumpf, MD  Brief History/Interval Summary: 77 year old female with a past medical history of end-stage renal disease on hemodialysis, hypertension, hyperlipidemia, protein C deficiency, history of pituitary adenoma, history of DVT on warfarin, type 2 diabetes, wound VAC in the left lower extremity presented with increasing pain and erythema of the left upper leg.  Patient recently underwent left thigh graft thrombectomy and had a wound with wound VAC.  Patient was hospitalized for further management.  Reason for Visit: Cellulitis of the left leg  Consultants: Vascular surgery.  Nephrology.  Procedures: None yet  Antibiotics: Vancomycin and Zosyn  Subjective/Interval History: Patient continues to have pain in the left thigh area.  Denies any difficulty breathing.  No nausea vomiting.  ROS: No chest pain or headaches.  Objective:  Vital Signs  Vitals:   07/12/17 0857 07/12/17 0930 07/12/17 1000 07/12/17 1030  BP: (!) 128/47 (!) 115/45 (!) 143/55 (!) 127/45  Pulse: 76 67 70 68  Resp: 14 13 19 14   Temp:      TempSrc:      SpO2: 97% 97% 97% 96%   No intake or output data in the 24 hours ending 07/12/17 1136 There were no vitals filed for this visit.  General appearance: alert, cooperative, appears stated age and no distress Head: Normocephalic, without obvious abnormality, atraumatic Resp: clear to auscultation bilaterally Cardio: regular rate and rhythm, S1, S2 normal, no murmur, click, rub or gallop GI: soft, non-tender; bowel sounds normal; no masses,  no organomegaly Extremities: Dressing noted over the left anterior thigh placed at this morning.  Not removed.  Erythema noted around it.  Tender to palpation. Neurologic: No obvious focal neurological deficits.  Lab Results:  Data Reviewed: I have personally reviewed following labs and imaging  studies  CBC: Recent Labs  Lab 07/11/17 2227 07/12/17 0523  WBC 13.7* 11.7*  NEUTROABS 10.7*  --   HGB 11.0* 10.4*  HCT 34.4* 32.8*  MCV 82.9 81.6  PLT 154 118*    Basic Metabolic Panel: Recent Labs  Lab 07/11/17 2227 07/12/17 0523  NA 135 137  K 4.0 4.2  CL 98* 99*  CO2 22 25  GLUCOSE 239* 86  BUN 39* 41*  CREATININE 6.94* 7.74*  CALCIUM 8.1* 8.0*    GFR: Estimated Creatinine Clearance: 6.5 mL/min (A) (by C-G formula based on SCr of 7.74 mg/dL (H)).  Coagulation Profile: Recent Labs  Lab 07/12/17 0127  INR 2.58   CBG: Recent Labs  Lab 07/12/17 0730  GLUCAP 88    Radiology Studies: Ct Extremity Lower Left Wo Contrast  Result Date: 07/12/2017 CLINICAL DATA:  Wound in the left thigh EXAM: CT OF THE LOWER LEFT EXTREMITY WITHOUT CONTRAST TECHNIQUE: Multidetector CT imaging of the lower left extremity was performed according to the standard protocol. COMPARISON:  None. FINDINGS: Bones/Joint/Cartilage Mild SI joint disease. Left pubic symphysis and rami appears intact. The left femur demonstrates no fracture or malalignment. No abnormal lucency, periostitis or bone destruction. Mild lateral subluxation of the patella with patellofemoral degenerative changes. Moderate arthritis of the medial and lateral compartments of the left knee. No large left hip effusion or knee effusion. Loop graft in the left thigh with anastomosis at the left femoral vessels. Ligaments Suboptimally assessed by CT. Muscles and Tendons Quadriceps tendon and patellar tendons appear intact. No intramuscular fluid collections are seen. Slightly indistinct appearance of the distal medial vastus muscle. Soft tissues Skin thickening and  moderate subcutaneous edema and soft tissue stranding involving the medial aspect of the mid to distal thigh. Wound within the medial thigh soft tissues with small foci of gas and radiopaque material, likely packing material. Dominant fluid collection within the medial  subcutaneous fat of the distal thigh just above the knee, this measures 8.2 x 2.9 cm. Vascular calcifications. IMPRESSION: 1. Deep wound within the soft tissues of the mid to distal medial thigh. Skin thickening and moderate edema and inflammation of the subcutaneous fat of the mid to distal medial thigh consistent with cellulitis. Indistinct appearance of the distal medial vastus muscle likely representing muscle edema/myositis. Dominant fluid collection within the distal thigh subcutaneous fat, just above the knee measuring 8.2 cm which may represent an inflammatory or infected fluid collection. No bone destruction, periostitis or abnormal lucency to suggest osteomyelitis by CT. 2. Arthritis of the left knee with mild lateral subluxation of the patella Electronically Signed   By: Donavan Foil M.D.   On: 07/12/2017 01:28     Medications:  Scheduled: . calcitRIOL  0.5 mcg Oral Daily  . carvedilol  6.25 mg Oral BID  . collagenase   Topical Daily  . docusate sodium  100 mg Oral BID  . famotidine  20 mg Oral Daily  . feeding supplement (PRO-STAT SUGAR FREE 64)  30 mL Oral BID  . gabapentin  300 mg Oral QHS  . insulin aspart  0-9 Units Subcutaneous TID WC  . insulin glargine  3 Units Subcutaneous QHS  . midodrine  10 mg Oral Q M,W,F-HD  . multivitamin with minerals  1 tablet Oral QHS  . pantoprazole  40 mg Oral BID AC  . PARoxetine  10 mg Oral Daily  . polyethylene glycol  17 g Oral Daily  . simvastatin  40 mg Oral q1800  . sucralfate  1 g Oral TID WC & HS  . thiamine  100 mg Oral Daily  . warfarin  5 mg Oral ONCE-1800  . Warfarin - Pharmacist Dosing Inpatient   Does not apply q1800   Continuous: . piperacillin-tazobactam (ZOSYN)  IV 3.375 g (07/12/17 0855)  . vancomycin     JSE:[id number] **OR** acetaminophen, hydrALAZINE, ipratropium-albuterol, linaclotide, ondansetron **OR** ondansetron (ZOFRAN) IV, oxyCODONE-acetaminophen, senna-docusate, sodium chloride flush, triamcinolone cream,  zolpidem  Assessment/Plan:  Principal Problem:   Cellulitis of left lower extremity Active Problems:   Essential hypertension   DVT (deep venous thrombosis) (HCC)   ESRD on dialysis ([place])   Anemia due to chronic kidney disease   Diabetes mellitus with end stage renal disease (HCC)   GERD (gastroesophageal reflux disease)   Protein C deficiency (HCC)   Leg wound, left   HLD (hyperlipidemia)    Cellulitis of the left lower extremity in the setting of chronic wound and recent surgery Patient underwent vascular surgery recently to that area.  She had a wound VAC in place.  CT scan raises concern for fluid collection.  Vascular surgery is following.  Patient is on vancomycin and Zosyn.  Follow-up on cultures.  Lactic acid level was normal.  Pro-calcitonin 8.26.  WBC was noted to be elevated and better this morning.  History of DVT and protein C deficiency She is on warfarin which is being continued.  INR to be monitored.  Pharmacy is assisting.  History of essential hypertension Continue home medications.  Monitor blood pressures closely.  End-stage renal disease on hemodialysis (MWF) Nephrology has been consulted.  Anemia chronic kidney disease Hemoglobin stable.  No evidence of overt bleeding.  Continue to monitor.  History of diabetes mellitus type 2 in the setting of end-stage renal disease Monitor CBGs.  Lantus is being continued.  History of GERD Continue home medications.  DVT Prophylaxis: Warfarin    Code Status: DNR Family Communication: No family at bedside.  Discussed with the patient. Disposition Plan: Management as outlined above.    LOS: 0 days   [facility] Pager [phone] 07/12/2017, 11:36 AM  If 7PM-7AM, please contact night-coverage at [email or web address], password [facility]

## 2017-07-12 NOTE — Progress Notes (Signed)
[facility]  Patient Status: Active (receiving services up to time of hospitalization)  [facility] is providing the following services: RN and PT  If patient discharges after hours, please call [phone].   Janae Sauce 07/12/2017, 5:35 PM

## 2017-07-12 NOTE — ED Notes (Signed)
Admitting MD at bedside.

## 2017-07-12 NOTE — Progress Notes (Addendum)
     HPI: 77 y/o female was originally seen in the ED with left thigh pain post cannulation for HD over the weekend surrounding 05/10/2017.  She  s/p left thigh AV graft placed by Dr. Scot Dock on 03/16/2017.  Dr. Trula Slade performed shuntogram and placed a stent in the pseudoaneurysm (7 x 50 Viabahn).  2 days later she returned to the OR for Evacuation of left thigh hematoma and placement of wound vac.  She was last seen in our office by Dr. Trula Slade on 07/05/2017 for wound evaluation and instructed to continue with the wound vac.  F/U in 2 weeks.    She reported to the [facility] ED with increased pain and redness of the medial left thigh.  No fever or chills.     Objective (!) 114/36 73 98 F (36.7 C) (Oral) 15 98% No intake or output data in the 24 hours ending 07/12/17 0729  Medial thigh with erythema and firmness to palpation Wound bed with mix of beefy red base and yellow eschar.  Lateral tunnel approximately 2 cm deep.  No active bleeding or drainage. Palpable thrill in the graft lateral to the wound. Lungs non labored breathing Heart RRR   Assessment/Planning: Left medial thigh wound with cellulitis s/p pseudoaneurysm   We will discontinue the wound vac and start santyl wet to dry dressing changes daily to the left medial thigh. Start empiric IV antibiotics.  We will have to keep in mind that the graft is synthetic material and if it gets infected we will have to remove the graft. She is Afebrile with mild leukocytosis    Ashlee Miles 07/12/2017 7:29 AM --  Laboratory Lab Results: Recent Labs    07/11/17 2227 07/12/17 0523  WBC 13.7* 11.7*  HGB 11.0* 10.4*  HCT 34.4* 32.8*  PLT 154 PENDING   BMET Recent Labs    07/11/17 2227 07/12/17 0523  NA 135 137  K 4.0 4.2  CL 98* 99*  CO2 22 25  GLUCOSE 239* 86  BUN 39* 41*  CREATININE 6.94* 7.74*  CALCIUM 8.1* 8.0*    COAG Lab Results  Component Value Date   INR 2.58 07/12/2017   INR 2.29 06/24/2017   INR  2.39 06/22/2017   No results found for: PTT   Addendum  I have independently interviewed and examined the patient, and I agree with the physician assistant's findings.  VAC removed.  Some necrotic tissue in wound bed.  No graft evident.  Tenderness in distal thigh consistent with cellulitis  - Stop VAC - Santyl to L thigh wound BID - Wet-to-dry dressing BID on top Santyl - Broad spectrum abx given pt multiple co-morbidities - CT L leg to evaluate for any residual pockets of air/fluid  Adele Barthel, MD, FACS [facility] Office: [phone] Pager: [phone]  07/12/2017, 8:27 AM

## 2017-07-12 NOTE — Consult Note (Signed)
[facility] Renal Consultation Note    Indication for Consultation:  Management of ESRD/hemodialysis; anemia, hypertension/volume and secondary hyperparathyroidism  IDP:OEUMP, Richard, MD  HPI: Ashlee Miles is a 77 y.o. female. ESRD on HD MWF at [facility], first starting on 03/16/17.  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  Of note she has had a wound vac in place on her upper medial left thigh after a L thigh AVG thrombectomy following stent placement within a pseudoaneurysm.  She has a wound care nurse who come to her house weekly for care.   Patient presented to the ED due to worsening erythema and pain in her upper left leg. She was afebrile, vital signs within normal limits, with a WBC of 13.7.  She was admitted for further evaluation and management of cellulitis.   Patient was seen and examined at bedside.  Admits to increased pain, swelling and erythema in her upper left thigh over the last week. Denies fever, chills, n/v/d, SOB, CP, weakness, and dizziness.  Her last dialysis was on 07/10/17,  completed full treatment, tolerated well using her L thigh AVG.  Denies any difficultly with cannulation. Patient is compliant with prescribed dialysis regimen.   Past Medical History:  Diagnosis Date  . CHF (congestive heart failure) ([place])   . Diabetes mellitus without complication ([place])   . ESRD (end stage renal disease) on dialysis ([place])   . History of pituitary adenoma   . Hypertension   . Protein C deficiency [facility])    Past Surgical History:  Procedure Laterality Date  . AV FISTULA PLACEMENT Left 03/16/2017   Procedure: INSERTION OF ARTERIOVENOUS (AV) GORE-TEX GRAFT THIGH-LEFT;  Surgeon: Angelia Mould, MD;  Location: [place];  Service: Vascular;  Laterality: Left;  . CHOLECYSTECTOMY    . DIALYSIS FISTULA CREATION     clotted  off  . FALSE ANEURYSM REPAIR Left 05/10/2017   Procedure: REPAIR FALSE ANEURYSM left thigh AVGG using Gore 6mmx5cm Viabahn Endoprosthesis;  Surgeon: Serafina Mitchell, MD;  Location[facility] OR;  Service: Vascular;  Laterality: Left;  . INSERTION OF DIALYSIS CATHETER Right 03/16/2017   Procedure: INSERTION OF RIGHT FEMORAL TUNNELED DIALYSIS CATHETER;  Surgeon: Angelia Mould, MD;  Location: [facility] OR;  Service: Vascular;  Laterality: Right;  . IR THROMBECTOMY AV FISTULA W/THROMBOLYSIS/PTA INC/SHUNT/IMG LEFT Left 06/22/2017  . IR US GUIDE VASC ACCESS LEFT  06/22/2017  . IVC FILTER INSERTION    . PORTACATH PLACEMENT    . THROMBECTOMY AND REVISION OF ARTERIOVENTOUS (AV) GORETEX  GRAFT Left 06/02/2017   Procedure: EVACUATION OF LEFT THIGH HEMATOMA  AND APPLICATION OF WOUND VAC;  Surgeon: Serafina Mitchell, MD;  Location: [facility] OR;  Service: Vascular;  Laterality: Left;   Family History  Problem Relation Age of Onset  . Breast cancer Mother   . Diabetes Sister   . Diabetes Brother   . CAD Other   . Stroke Neg Hx    Social History:  reports that  has never smoked. she has never used smokeless tobacco. She reports that she does not drink alcohol or use drugs. Allergies  Allergen Reactions  . Other    Prior to Admission medications   Medication Sig Start Date End Date Taking? Authorizing Provider  Amino Acids-Protein Hydrolys (FEEDING SUPPLEMENT, PRO-STAT SUGAR FREE 64,) LIQD Take 30 mLs by mouth 2 (two) times daily. 04/12/17  Sheikh, Omair Latif, DO  calcitRIOL (ROCALTROL) 0.5 MCG capsule Take 0.5 mcg by mouth daily.    [provider]  carvedilol (COREG) 6.25 MG tablet Take 1 tablet (6.25 mg total) by mouth 2 (two) times daily. 03/04/17 03/04/18  Hosie Poisson, MD  docusate sodium (COLACE) 100 MG capsule Take 1 capsule (100 mg total) by mouth 2 (two) times daily. 04/12/17   Raiford Noble Latif, DO  famotidine (PEPCID) 20 MG tablet Take 1 tablet (20 mg total) daily by mouth. 04/28/17   Lavina Hamman, MD  gabapentin (NEURONTIN) 300 MG capsule Take 1 capsule (300 mg total) by mouth at bedtime. 06/09/17   Dessa Phi, DO  insulin aspart (NOVOLOG) 100 UNIT/ML injection Inject 0-9 Units into the skin 3 (three) times daily with meals. 03/22/17   Debbe Odea, MD  insulin glargine (LANTUS) 100 UNIT/ML injection Inject 0.03 mLs (3 Units total) into the skin at bedtime. 06/09/17   Dessa Phi, DO  ipratropium-albuterol (DUONEB) 0.5-2.5 (3) MG/3ML SOLN Take 3 mLs by nebulization every 4 (four) hours as needed. 04/12/17   Raiford Noble Latif, DO  linaclotide (LINZESS) 145 MCG CAPS capsule Take 145 mcg by mouth daily as needed.     [provider]  midodrine (PROAMATINE) 10 MG tablet Take 1 tablet (10 mg total) by mouth every Monday, Wednesday, and Friday with hemodialysis. 04/14/17   Raiford Noble Latif, DO  multivitamin (RENA-VIT) TABS tablet Take 1 tablet by mouth at bedtime. 04/12/17   Sheikh, Omair Latif, DO  pantoprazole (PROTONIX) 40 MG tablet Take 1 tablet (40 mg total) 2 (two) times daily before a meal by mouth. 04/28/17   Lavina Hamman, MD  PARoxetine (PAXIL) 10 MG tablet Take 1 tablet (10 mg total) daily by mouth. 04/28/17   Lavina Hamman, MD  polyethylene glycol [facility] Floria Raveling) packet Take 17 g daily by mouth. 04/28/17   Lavina Hamman, MD  simvastatin (ZOCOR) 40 MG tablet Take 40 mg by mouth daily at 6 PM.    [provider]  sucralfate (CARAFATE) 1 GM/10ML suspension Take 10 mLs (1 g total) 4 (four) times daily -  with meals and at bedtime by mouth. 04/28/17   Lavina Hamman, MD  thiamine (VITAMIN B-1) 100 MG tablet Take 1 tablet (100 mg total) by mouth daily. 06/09/17   Dessa Phi, DO  traMADol (ULTRAM) 50 MG tablet Take 1 tablet (50 mg total) by mouth every 12 (twelve) hours as needed. 06/29/17   Lanae Crumbly, PA-C  triamcinolone cream (KENALOG) 0.1 % Apply 1 application topically every 12 (twelve) hours as needed.     [provider]   warfarin (COUMADIN) 5 MG tablet Take 5 mg by mouth daily at 6 PM.    [provider]   Current Facility-Administered Medications  Medication Dose Route Frequency Provider Last Rate Last Dose  . acetaminophen (TYLENOL) tablet 650 mg  650 mg Oral Q6H PRN Ivor Costa, MD       Or  . acetaminophen (TYLENOL) suppository 650 mg  650 mg Rectal Q6H PRN Ivor Costa, MD      . calcitRIOL (ROCALTROL) capsule 0.5 mcg  0.5 mcg Oral Daily Ivor Costa, MD      . carvedilol (COREG) tablet 6.25 mg  6.25 mg Oral BID Ivor Costa, MD      . collagenase (SANTYL) ointment   Topical Daily Conrad Stidham, MD      . docusate sodium (COLACE) capsule 100 mg  100  mg Oral BID Ivor Costa, MD      . famotidine (PEPCID) tablet 20 mg  20 mg Oral Daily Ivor Costa, MD      . feeding supplement (PRO-STAT SUGAR FREE 64) liquid 30 mL  30 mL Oral BID Ivor Costa, MD      . gabapentin (NEURONTIN) capsule 300 mg  300 mg Oral QHS Ivor Costa, MD      . hydrALAZINE (APRESOLINE) injection 5 mg  5 mg Intravenous Q2H PRN Ivor Costa, MD      . insulin aspart (novoLOG) injection 0-9 Units  0-9 Units Subcutaneous TID WC Ivor Costa, MD      . insulin glargine (LANTUS) injection 3 Units  3 Units Subcutaneous QHS Ivor Costa, MD   3 Units at 07/12/17 0534  . ipratropium-albuterol (DUONEB) 0.5-2.5 (3) MG/3ML nebulizer solution 3 mL  3 mL Nebulization Q4H PRN Ivor Costa, MD      . linaclotide Rolan Lipa) capsule 145 mcg  145 mcg Oral Daily PRN Ivor Costa, MD      . midodrine (PROAMATINE) tablet 10 mg  10 mg Oral Q M,W,F-HD Ivor Costa, MD      . multivitamin with minerals tablet 1 tablet  1 tablet Oral QHS Ivor Costa, MD      . ondansetron [facility]) tablet 4 mg  4 mg Oral Q6H PRN Ivor Costa, MD       Or  . ondansetron (ZOFRAN) injection 4 mg  4 mg Intravenous Q6H PRN Ivor Costa, MD      . oxyCODONE-acetaminophen (PERCOCET/ROXICET) 5-325 MG per tablet 1 tablet  1 tablet Oral Q4H PRN Ivor Costa, MD   1 tablet at 07/12/17 0739  . pantoprazole  (PROTONIX) EC tablet 40 mg  40 mg Oral BID Renella Cunas, MD   40 mg at 07/12/17 0739  . PARoxetine (PAXIL) tablet 10 mg  10 mg Oral Daily Ivor Costa, MD      . piperacillin-tazobactam (ZOSYN) IVPB 3.375 g  3.375 g Intravenous Q12H Laurence Slate M, PA-C 12.5 mL/hr at 07/12/17 0855 3.375 g at 07/12/17 0855  . polyethylene glycol (MIRALAX / GLYCOLAX) packet 17 g  17 g Oral Daily Ivor Costa, MD      . senna-docusate (Senokot-S) tablet 1 tablet  1 tablet Oral QHS PRN Ivor Costa, MD      . simvastatin (ZOCOR) tablet 40 mg  40 mg Oral q1800 Ivor Costa, MD      . sodium chloride flush (NS) 0.9 % injection 10-40 mL  10-40 mL Intracatheter PRN Winfred Leeds, Sam, MD      . sucralfate (CARAFATE) 1 GM/10ML suspension 1 g  1 g Oral TID WC & HS Ivor Costa, MD   Stopped at 07/12/17 [phone]  . thiamine (VITAMIN B-1) tablet 100 mg  100 mg Oral Daily Ivor Costa, MD      . triamcinolone cream (KENALOG) 0.1 % 1 application  1 application Topical B51W PRN Ivor Costa, MD      . vancomycin (VANCOCIN) IVPB 750 mg/150 ml premix  750 mg Intravenous Q M,W,F-HD Ivor Costa, MD      . warfarin (COUMADIN) tablet 5 mg  5 mg Oral ONCE-1800 Ivor Costa, MD      . Warfarin - Pharmacist Dosing Inpatient   Does not apply [id number] Ivor Costa, MD      . zolpidem Lorrin Mais) tablet 5 mg  5 mg Oral QHS PRN Ivor Costa, MD       Current Outpatient Medications  Medication Sig Dispense  Refill  . Amino Acids-Protein Hydrolys (FEEDING SUPPLEMENT, PRO-STAT SUGAR FREE 64,) LIQD Take 30 mLs by mouth 2 (two) times daily. 900 mL 0  . calcitRIOL (ROCALTROL) 0.5 MCG capsule Take 0.5 mcg by mouth daily.    . carvedilol (COREG) 6.25 MG tablet Take 1 tablet (6.25 mg total) by mouth 2 (two) times daily. 60 tablet 0  . docusate sodium (COLACE) 100 MG capsule Take 1 capsule (100 mg total) by mouth 2 (two) times daily. 10 capsule 0  . famotidine (PEPCID) 20 MG tablet Take 1 tablet (20 mg total) daily by mouth. 30 tablet 0  . gabapentin (NEURONTIN) 300 MG capsule  Take 1 capsule (300 mg total) by mouth at bedtime. 30 capsule 0  . insulin aspart (NOVOLOG) 100 UNIT/ML injection Inject 0-9 Units into the skin 3 (three) times daily with meals. 10 mL 11  . insulin glargine (LANTUS) 100 UNIT/ML injection Inject 0.03 mLs (3 Units total) into the skin at bedtime. 10 mL 0  . ipratropium-albuterol (DUONEB) 0.5-2.5 (3) MG/3ML SOLN Take 3 mLs by nebulization every 4 (four) hours as needed. 360 mL 0  . linaclotide (LINZESS) 145 MCG CAPS capsule Take 145 mcg by mouth daily as needed.     . midodrine (PROAMATINE) 10 MG tablet Take 1 tablet (10 mg total) by mouth every Monday, Wednesday, and Friday with hemodialysis. 30 tablet 0  . multivitamin (RENA-VIT) TABS tablet Take 1 tablet by mouth at bedtime. 30 tablet 0  . pantoprazole (PROTONIX) 40 MG tablet Take 1 tablet (40 mg total) 2 (two) times daily before a meal by mouth. 60 tablet 0  . PARoxetine (PAXIL) 10 MG tablet Take 1 tablet (10 mg total) daily by mouth. 30 tablet 0  . polyethylene glycol (MIRALAX / GLYCOLAX) packet Take 17 g daily by mouth. 14 each 0  . simvastatin (ZOCOR) 40 MG tablet Take 40 mg by mouth daily at 6 PM.    . sucralfate (CARAFATE) 1 GM/10ML suspension Take 10 mLs (1 g total) 4 (four) times daily -  with meals and at bedtime by mouth. 420 mL 0  . thiamine (VITAMIN B-1) 100 MG tablet Take 1 tablet (100 mg total) by mouth daily. 30 tablet 0  . traMADol (ULTRAM) 50 MG tablet Take 1 tablet (50 mg total) by mouth every 12 (twelve) hours as needed. 30 tablet 0  . triamcinolone cream (KENALOG) 0.1 % Apply 1 application topically every 12 (twelve) hours as needed.     . warfarin (COUMADIN) 5 MG tablet Take 5 mg by mouth daily at 6 PM.     Labs: Basic Metabolic Panel: Recent Labs  Lab 07/11/17 2227 07/12/17 0523  NA 135 137  K 4.0 4.2  CL 98* 99*  CO2 22 25  GLUCOSE 239* 86  BUN 39* 41*  CREATININE 6.94* 7.74*  CALCIUM 8.1* 8.0*   CBC: Recent Labs  Lab 07/11/17 2227 07/12/17 0523  WBC 13.7*  11.7*  NEUTROABS 10.7*  --   HGB 11.0* 10.4*  HCT 34.4* 32.8*  MCV 82.9 81.6  PLT 154 118*   CBG: Recent Labs  Lab 07/12/17 0730  GLUCAP 88   Studies/Results: Ct Extremity Lower Left Wo Contrast  Result Date: 07/12/2017 CLINICAL DATA:  Wound in the left thigh EXAM: CT OF THE LOWER LEFT EXTREMITY WITHOUT CONTRAST TECHNIQUE: Multidetector CT imaging of the lower left extremity was performed according to the standard protocol. COMPARISON:  None. FINDINGS: Bones/Joint/Cartilage Mild SI joint disease. Left pubic symphysis and rami appears intact.  The left femur demonstrates no fracture or malalignment. No abnormal lucency, periostitis or bone destruction. Mild lateral subluxation of the patella with patellofemoral degenerative changes. Moderate arthritis of the medial and lateral compartments of the left knee. No large left hip effusion or knee effusion. Loop graft in the left thigh with anastomosis at the left femoral vessels. Ligaments Suboptimally assessed by CT. Muscles and Tendons Quadriceps tendon and patellar tendons appear intact. No intramuscular fluid collections are seen. Slightly indistinct appearance of the distal medial vastus muscle. Soft tissues Skin thickening and moderate subcutaneous edema and soft tissue stranding involving the medial aspect of the mid to distal thigh. Wound within the medial thigh soft tissues with small foci of gas and radiopaque material, likely packing material. Dominant fluid collection within the medial subcutaneous fat of the distal thigh just above the knee, this measures 8.2 x 2.9 cm. Vascular calcifications. IMPRESSION: 1. Deep wound within the soft tissues of the mid to distal medial thigh. Skin thickening and moderate edema and inflammation of the subcutaneous fat of the mid to distal medial thigh consistent with cellulitis. Indistinct appearance of the distal medial vastus muscle likely representing muscle edema/myositis. Dominant fluid collection within  the distal thigh subcutaneous fat, just above the knee measuring 8.2 cm which may represent an inflammatory or infected fluid collection. No bone destruction, periostitis or abnormal lucency to suggest osteomyelitis by CT. 2. Arthritis of the left knee with mild lateral subluxation of the patella Electronically Signed   By: Donavan Foil M.D.   On: 07/12/2017 01:28    ROS: All others negative except those listed in HPI.  Physical Exam: Vitals:   07/12/17 0700 07/12/17 0745 07/12/17 0845 07/12/17 0857  BP: (!) 114/36  (!) 70/60 (!) 128/47  Pulse: 73 75 76 76  Resp: 15 19 17 14   Temp:      TempSrc:      SpO2: 98% 97% 97% 97%     General: NAD, obese, chronically ill appearing female Head: NCAT sclera not icteric MMM Neck: Supple. No lymphadenopathy. No JVD Lungs: CTAB. No wheeze, rales or rhonchi. Breathing is unlabored. Heart: RRR. No murmur, rubs or gallops.  Abdomen: soft, obese, nontender, +BS, no guarding, no rebound tenderness M/S:  Equal strength b/l in upper and lower extremities.  Lower extremities:no pitting edema. Left medial thigh wound bandaged. +tenderness, erythema, warmth, swelling surrounding bandage and extending to just above the middle knee Neuro: A&Ox3. Moves all extremities spontaneously. Psych:  Responds to questions appropriately with a normal affect. Dialysis Access: L thigh AVG +b/t  Dialysis Orders:  MWF - [facility]  4hrs, BFR 400, DFR 800,  EDW 98.5kg, 3K/ 2.25 Ca,   Access: L thigh AVG  Heparin 3000 Unit bolus IV qHD Hectorol 56mcg IV qHD TIW Venofer 100mg  IV qHD mircera 166mcg IV q2wks - no longer ordered, last 1/11  Assessment/Plan: 1.  L thigh cellulitis w assoc wound/ active L thigh AVG - ABX, BC pending. WBC improved. VVS following. 2.  ESRD -  HD orders written for today per regular schedule.  3.  Hypertension/volume  - BP variable, mostly soft. If weights correct, under EDW. Does not appear volume overloaded. Titrate down as tolerated.  4.  Anemia  -  Hgb 10.4. No ESA indicated at this time. Holding iron due to infection.  5.  Secondary Hyperparathyroidism -  Ca in goal. Need P. Continue binders, VDRA, and sensipar. 6.  Nutrition - Renal diet with fluid restrictions. Renavite.  7. H/o DVT - on  coumadin, per primary 8. DMT2 9. GERD  Jen Mow, PA-C [facility] Pager: [phone] 07/12/2017, 9:51 AM   Pt seen, examined and agree w A/P as above.  [name] MD [employer] pager [phone]   07/13/2017, 8:22 AM

## 2017-07-12 NOTE — Progress Notes (Signed)
Ashlee Miles [id number]  Code Status: DNR  Admission Data: 07/12/2017 6:09 PM  Attending Provider: Curly Rim  FXT:KWIOX, Richard, MD  Consults/ Treatment Team: Treatment Team:  Serafina Mitchell, MD Conrad [place], MD Roney Jaffe, MD  [name] is a 77 y.o. female patient admitted from ED awake, alert - oriented X 3 - no acute distress noted. VSS - Blood pressure (!) 105/39, pulse 69, temperature 98.6 F (37 C), temperature source Oral, resp. rate 18, height 5\' 2"  (1.575 m), weight 97.8 kg (215 lb 9.6 oz), SpO2 98 %. no c/o shortness of breath, no c/o chest pain. Cardiac tele # [id number], in place. Has implanted right chest port that was placed about a month ago.   Orientation to room, and floor completed with information packet given to patient. Admission INP armband ID verified with patient and in place.  SR up x 2, fall assessment complete, with patient able to verbalize understanding of risk associated with falls, and verbalized understanding to call nsg before up out of bed. Call light within reach, patient able to voice, and demonstrate understanding. Stage 2 pressure ulcer noted to mid buttocks, foam in place.  ?  Will cont to eval and treat per MD orders.  Melonie Florida, RN  07/12/2017 6:09 PM

## 2017-07-12 NOTE — Progress Notes (Signed)
Pharmacy Antibiotic Note  Ashlee Miles is a 77 y.o. female admitted on 07/11/2017 with LLE cellulitis.  Pharmacy has been consulted for Vancomycin dosing.  Plan: Vancomycin 1500 mg IV now, then 750 mg Iv after each HD   Temp (24hrs), Avg:98 F (36.7 C), Min:98 F (36.7 C), Max:98 F (36.7 C)  Recent Labs  Lab 07/11/17 2227  WBC 13.7*  CREATININE 6.94*    Estimated Creatinine Clearance: 7.2 mL/min (A) (by C-G formula based on SCr of 6.94 mg/dL (H)).    Allergies  Allergen Reactions  . Other     Ashlee Miles 07/12/2017 12:34 AM

## 2017-07-12 NOTE — Progress Notes (Signed)
ANTICOAGULATION CONSULT NOTE - Initial Consult  Pharmacy Consult for Coumadin Indication: h/o DVT/Protein C deficiency  Allergies  Allergen Reactions  . Other     Patient Measurements:    Vital Signs: Temp: 98 F (36.7 C) (01/22 2010) Temp Source: Oral (01/22 2010) BP: 116/44 (01/23 0200) Pulse Rate: 70 (01/23 0200)  Labs: Recent Labs    07/11/17 2227 07/12/17 0127  HGB 11.0*  --   HCT 34.4*  --   PLT 154  --   LABPROT  --  27.5*  INR  --  2.58  CREATININE 6.94*  --     Estimated Creatinine Clearance: 7.2 mL/min (A) (by C-G formula based on SCr of 6.94 mg/dL (H)).   Medical History: Past Medical History:  Diagnosis Date  . CHF (congestive heart failure) ([place])   . Diabetes mellitus without complication ([place])   . ESRD (end stage renal disease) on dialysis ([place])   . History of pituitary adenoma   . Hypertension   . Protein C deficiency ([place])     Medications:  No current facility-administered medications on file prior to encounter.    Current Outpatient Medications on File Prior to Encounter  Medication Sig Dispense Refill  . Amino Acids-Protein Hydrolys (FEEDING SUPPLEMENT, PRO-STAT SUGAR FREE 64,) LIQD Take 30 mLs by mouth 2 (two) times daily. 900 mL 0  . calcitRIOL (ROCALTROL) 0.5 MCG capsule Take 0.5 mcg by mouth daily.    . carvedilol (COREG) 6.25 MG tablet Take 1 tablet (6.25 mg total) by mouth 2 (two) times daily. 60 tablet 0  . docusate sodium (COLACE) 100 MG capsule Take 1 capsule (100 mg total) by mouth 2 (two) times daily. 10 capsule 0  . famotidine (PEPCID) 20 MG tablet Take 1 tablet (20 mg total) daily by mouth. 30 tablet 0  . gabapentin (NEURONTIN) 300 MG capsule Take 1 capsule (300 mg total) by mouth at bedtime. 30 capsule 0  . insulin aspart (NOVOLOG) 100 UNIT/ML injection Inject 0-9 Units into the skin 3 (three) times daily with meals. 10 mL 11  . insulin glargine (LANTUS) 100 UNIT/ML injection Inject 0.03 mLs (3 Units total) into the skin at  bedtime. 10 mL 0  . ipratropium-albuterol (DUONEB) 0.5-2.5 (3) MG/3ML SOLN Take 3 mLs by nebulization every 4 (four) hours as needed. 360 mL 0  . linaclotide (LINZESS) 145 MCG CAPS capsule Take 145 mcg by mouth daily as needed.     . midodrine (PROAMATINE) 10 MG tablet Take 1 tablet (10 mg total) by mouth every Monday, Wednesday, and Friday with hemodialysis. 30 tablet 0  . multivitamin (RENA-VIT) TABS tablet Take 1 tablet by mouth at bedtime. 30 tablet 0  . pantoprazole (PROTONIX) 40 MG tablet Take 1 tablet (40 mg total) 2 (two) times daily before a meal by mouth. 60 tablet 0  . PARoxetine (PAXIL) 10 MG tablet Take 1 tablet (10 mg total) daily by mouth. 30 tablet 0  . polyethylene glycol (MIRALAX / GLYCOLAX) packet Take 17 g daily by mouth. 14 each 0  . simvastatin (ZOCOR) 40 MG tablet Take 40 mg by mouth daily at 6 PM.    . sucralfate (CARAFATE) 1 GM/10ML suspension Take 10 mLs (1 g total) 4 (four) times daily -  with meals and at bedtime by mouth. 420 mL 0  . thiamine (VITAMIN B-1) 100 MG tablet Take 1 tablet (100 mg total) by mouth daily. 30 tablet 0  . traMADol (ULTRAM) 50 MG tablet Take 1 tablet (50 mg total) by  mouth every 12 (twelve) hours as needed. 30 tablet 0  . triamcinolone cream (KENALOG) 0.1 % Apply 1 application topically every 12 (twelve) hours as needed.     . warfarin (COUMADIN) 5 MG tablet Take 5 mg by mouth daily at 6 PM.      Assessment: 77 y.o. female admitted with LLE cellulitis, h/o DVT, to continue Coumadin Goal of Therapy:  INR 2-3 Monitor platelets by anticoagulation protocol: Yes   Plan:  Coumadin 5 mg today Daily INR  Tanga Gloor, Bronson Curb 07/12/2017,2:46 AM

## 2017-07-12 NOTE — ED Notes (Signed)
The pts son in law Clare Gandy called and verbalized that the pt has a port and should not be stuck, this RN discussed that blood cultures were ordered due to her leg infection and that this cannot be drawn from her port, family verbalizes understanding, pts family wants to ensure the pt does not miss her dialysis which is scheduled for today per her normal schedule, pts family wants to be updated at [phone] or [phone]

## 2017-07-12 NOTE — ED Notes (Signed)
Renal Diet has been ordered for Lunch.

## 2017-07-13 ENCOUNTER — Other Ambulatory Visit: Payer: Self-pay

## 2017-07-13 LAB — PROTIME-INR
INR: 2.02
Prothrombin Time: 22.7 seconds — ABNORMAL HIGH (ref 11.4–15.2)

## 2017-07-13 LAB — HEPARIN LEVEL (UNFRACTIONATED): Heparin Unfractionated: 0.2 IU/mL — ABNORMAL LOW (ref 0.30–0.70)

## 2017-07-13 LAB — GLUCOSE, CAPILLARY
Glucose-Capillary: 134 mg/dL — ABNORMAL HIGH (ref 65–99)
Glucose-Capillary: 170 mg/dL — ABNORMAL HIGH (ref 65–99)
Glucose-Capillary: 92 mg/dL (ref 65–99)
Glucose-Capillary: 200 mg/dL — ABNORMAL HIGH (ref 65–99)
Glucose-Capillary: 128 mg/dL — ABNORMAL HIGH (ref 65–99)

## 2017-07-13 LAB — MRSA PCR SCREENING: MRSA by PCR: NEGATIVE

## 2017-07-13 MED ORDER — SODIUM CHLORIDE 0.9 % IV SOLN
Freq: Once | INTRAVENOUS | Status: AC
  Administered 2017-07-13: 15:00:00 via INTRAVENOUS

## 2017-07-13 MED ORDER — VANCOMYCIN HCL IN DEXTROSE 750-5 MG/150ML-% IV SOLN
750.0000 mg | Freq: Once | INTRAVENOUS | Status: AC
Start: 2017-07-13 — End: 2017-07-13
  Administered 2017-07-13: 750 mg via INTRAVENOUS
  Filled 2017-07-13: qty 150

## 2017-07-13 MED ORDER — HEPARIN (PORCINE) IN NACL 100-0.45 UNIT/ML-% IJ SOLN
1350.0000 [IU]/h | INTRAMUSCULAR | Status: DC
  Administered 2017-07-13: 1150 [IU]/h via INTRAVENOUS
  Filled 2017-07-13: qty 250

## 2017-07-13 NOTE — Progress Notes (Addendum)
[facility] [place]  Subjective  - She is comfortable.   Objective (!) 117/32 72 98.6 F (37 C) (Oral) 18 99%  Intake/Output Summary (Last 24 hours) at 07/13/2017 0743 Last data filed at 07/13/2017 0300 Gross per 24 hour  Intake 340 ml  Output 2300 ml  Net -1960 ml    Dry dressing over stick sites lateral left thigh graft. Santyl wet to dry dressing to open thigh wound  CT IMPRESSION: 1. Deep wound within the soft tissues of the mid to distal medial thigh. Skin thickening and moderate edema and inflammation of the subcutaneous fat of the mid to distal medial thigh consistent with cellulitis. Indistinct appearance of the distal medial vastus muscle likely representing muscle edema/myositis. Dominant fluid collection within the distal thigh subcutaneous fat, just above the knee measuring 8.2 cm which may represent an inflammatory or infected fluid collection.  Assessment/Planning: Left medial thigh wound s/p hematoma evacuation with cellulitis s/p pseudoaneurysm   CT scan show fluid collection in the distal medial thigh.  This does not appear to communicate with the open wound or the graft at this time.  The fluid collection is just above the left knee.    I don't think the fluid collection is related to the recent problems with the graft at this time.  I recommend continued IV antibiotics for the cellulitis and BID santyl dressing changes to the open thigh wound.    Roxy Horseman 07/13/2017 7:43 AM --  Laboratory Lab Results: Recent Labs    07/11/17 2227 07/12/17 0523  WBC 13.7* 11.7*  HGB 11.0* 10.4*  HCT 34.4* 32.8*  PLT 154 118*   BMET Recent Labs    07/11/17 2227 07/12/17 0523  NA 135 137  K 4.0 4.2  CL 98* 99*  CO2 22 25  GLUCOSE 239* 86  BUN 39* 41*  CREATININE 6.94* 7.74*  CALCIUM 8.1* 8.0*    COAG Lab Results  Component Value Date   INR 2.02 07/13/2017   INR 2.58 07/12/2017   INR 2.29 06/24/2017   No results  found for: PTT  Addendum  I have independently interviewed and examined the patient, and I agree with the physician assistant's findings.  Given the physical findings, the fluid collection on the CT is likely an abscess so drainage is recommended.  - Reverse anticoagulation (3 u FFP) - Start Heparin drip - Hold Warfarin - Recheck INR in AM - Will schedule for tomorrow with either Dr. Scot Dock or Dr. Loanne Drilling, MD, [facility] [place] Office: [phone] Pager: [phone]  07/13/2017, 9:12 AM

## 2017-07-13 NOTE — Progress Notes (Signed)
ANTICOAGULATION CONSULT NOTE - Initial Consult  Pharmacy Consult for Heparin (while Coumadin on hold) Indication: hx DVT, protein C deficiency  Allergies  Allergen Reactions  . Other     Patient Measurements: Height: 5\' 2"  (157.5 cm) Weight: 214 lb 8.1 oz (97.3 kg) IBW/kg (Calculated) : 50.1 Heparin Dosing Weight: 73 kg  Vital Signs: Temp: 98.5 F (36.9 C) (01/24 0915) Temp Source: Oral (01/24 0915) BP: 139/39 (01/24 0915) Pulse Rate: 72 (01/24 0443)  Labs: Recent Labs    07/11/17 2227 07/12/17 0127 07/12/17 0523 07/13/17 0441  HGB 11.0*  --  10.4*  --   HCT 34.4*  --  32.8*  --   PLT 154  --  118*  --   LABPROT  --  27.5*  --  22.7*  INR  --  2.58  --  2.02  CREATININE 6.94*  --  7.74*  --     Estimated Creatinine Clearance: 6.7 mL/min (A) (by C-G formula based on SCr of 7.74 mg/dL (H)).   Medical History: Past Medical History:  Diagnosis Date  . CHF (congestive heart failure) ([place])   . Diabetes mellitus without complication ([place])   . ESRD (end stage renal disease) on dialysis ([place])   . History of pituitary adenoma   . Hypertension   . Protein C deficiency ([place])     Medications:  Scheduled:  . carvedilol  6.25 mg Oral BID  . cinacalcet  30 mg Oral Q supper  . collagenase   Topical BID  . docusate sodium  100 mg Oral BID  . [START ON 07/14/2017] doxercalciferol  6 mcg Intravenous Q M,W,F-HD  . famotidine  20 mg Oral Daily  . feeding supplement (PRO-STAT SUGAR FREE 64)  30 mL Oral BID  . gabapentin  300 mg Oral QHS  . insulin aspart  0-9 Units Subcutaneous TID WC  . insulin glargine  3 Units Subcutaneous QHS  . midodrine  10 mg Oral Q M,W,F-HD  . multivitamin  1 tablet Oral QHS  . pantoprazole  40 mg Oral BID AC  . PARoxetine  10 mg Oral Daily  . polyethylene glycol  17 g Oral Daily  . sevelamer carbonate  800 mg Oral TID WC  . simvastatin  40 mg Oral q1800  . sucralfate  1 g Oral TID WC & HS  . thiamine  100 mg Oral Daily  . Warfarin -  Pharmacist Dosing Inpatient   Does not apply q1800   Infusions:  . sodium chloride    . piperacillin-tazobactam (ZOSYN)  IV 3.375 g (07/13/17 0916)  . vancomycin Stopped (07/12/17 1250)  . vancomycin 750 mg (07/13/17 1001)    Assessment: 77 yo F on Coumadin PTA for hx of DVT and protein C deficiency.  Pt is asmitted with LLE cellulitis.  CT scan noted fluid collection on LLE concerning for abscess / infection with plans for drainage.  Coumadin to be reversed for OR.  To start heparin.  INR today 2.02.  Goal of Therapy:  Heparin level 0.3-0.7 units/ml Monitor platelets by anticoagulation protocol: Yes   Plan:  Start heparin at 1150 units/hr - no bolus d/t INR Heparin level in 8 hours Daily heparin level, CBC, and INR  [employer], Pharm.D., BCPS Clinical Pharmacist Pager: [phone] Clinical phone for 07/13/2017 from 8:30-4:00 is [phone]. After 4pm, please call Main [facility] [phone]) for assistance. 07/13/2017 10:09 AM

## 2017-07-13 NOTE — Progress Notes (Signed)
[facility] Progress Note   Subjective:   No new complaints.   Objective Vitals:   07/13/17 0204 07/13/17 0409 07/13/17 0443 07/13/17 0915  BP: (!) 100/59  (!) 117/32 (!) 139/39  Pulse: 73  72   Resp:   18 16  Temp:   98.6 F (37 C) 98.5 F (36.9 C)  TempSrc:   Oral Oral  SpO2:   99% 99%  Weight:  97.3 kg (214 lb 8.1 oz)    Height:       Physical Exam General:NAD, obese female, laying in bed  Heart: RRR no m/r/g Lungs:CTAB, no wheeze, rales or rhonchi Abdomen:soft, NTND Extremities: LLE: +tenderness, large dressing covering thigh, RLE: no edema Dialysis Access: L thigh AVG, +b, cannot appreciate thrill through bandage  Filed Weights   07/12/17 2040 07/13/17 0100 07/13/17 0409  Weight: 97.8 kg (215 lb 9.8 oz) 95.8 kg (211 lb 3.2 oz) 97.3 kg (214 lb 8.1 oz)    Intake/Output Summary (Last 24 hours) at 07/13/2017 1103 Last data filed at 07/13/2017 1001 Gross per 24 hour  Intake 460 ml  Output 2300 ml  Net -1840 ml    Additional Objective Labs: Basic Metabolic Panel: Recent Labs  Lab 07/11/17 2227 07/12/17 0523  NA 135 137  K 4.0 4.2  CL 98* 99*  CO2 22 25  GLUCOSE 239* 86  BUN 39* 41*  CREATININE 6.94* 7.74*  CALCIUM 8.1* 8.0*   CBC: Recent Labs  Lab 07/11/17 2227 07/12/17 0523  WBC 13.7* 11.7*  NEUTROABS 10.7*  --   HGB 11.0* 10.4*  HCT 34.4* 32.8*  MCV 82.9 81.6  PLT 154 118*   Blood Culture    Component Value Date/Time   SDES BLOOD RIGHT HAND 07/12/2017 1050   SPECREQUEST  07/12/2017 1050    BOTTLES DRAWN AEROBIC AND ANAEROBIC Blood Culture adequate volume   CULT NO GROWTH < 24 HOURS 07/12/2017 1050   REPTSTATUS PENDING 07/12/2017 1050    CBG: Recent Labs  Lab 07/12/17 0730 07/12/17 1438 07/12/17 1613 07/13/17 0228 07/13/17 0746  GLUCAP 88 174* 147* 134* 170*    Lab Results  Component Value Date   INR 2.02 07/13/2017   INR 2.58 07/12/2017   INR 2.29 06/24/2017   Studies/Results: Ct Extremity Lower Left Wo  Contrast  Result Date: 07/12/2017 CLINICAL DATA:  Wound in the left thigh EXAM: CT OF THE LOWER LEFT EXTREMITY WITHOUT CONTRAST TECHNIQUE: Multidetector CT imaging of the lower left extremity was performed according to the standard protocol. COMPARISON:  None. FINDINGS: Bones/Joint/Cartilage Mild SI joint disease. Left pubic symphysis and rami appears intact. The left femur demonstrates no fracture or malalignment. No abnormal lucency, periostitis or bone destruction. Mild lateral subluxation of the patella with patellofemoral degenerative changes. Moderate arthritis of the medial and lateral compartments of the left knee. No large left hip effusion or knee effusion. Loop graft in the left thigh with anastomosis at the left femoral vessels. Ligaments Suboptimally assessed by CT. Muscles and Tendons Quadriceps tendon and patellar tendons appear intact. No intramuscular fluid collections are seen. Slightly indistinct appearance of the distal medial vastus muscle. Soft tissues Skin thickening and moderate subcutaneous edema and soft tissue stranding involving the medial aspect of the mid to distal thigh. Wound within the medial thigh soft tissues with small foci of gas and radiopaque material, likely packing material. Dominant fluid collection within the medial subcutaneous fat of the distal thigh just above the knee, this measures 8.2 x 2.9 cm. Vascular calcifications. IMPRESSION:  1. Deep wound within the soft tissues of the mid to distal medial thigh. Skin thickening and moderate edema and inflammation of the subcutaneous fat of the mid to distal medial thigh consistent with cellulitis. Indistinct appearance of the distal medial vastus muscle likely representing muscle edema/myositis. Dominant fluid collection within the distal thigh subcutaneous fat, just above the knee measuring 8.2 cm which may represent an inflammatory or infected fluid collection. No bone destruction, periostitis or abnormal lucency to suggest  osteomyelitis by CT. 2. Arthritis of the left knee with mild lateral subluxation of the patella Electronically Signed   By: Donavan Foil M.D.   On: 07/12/2017 01:28    Medications: . sodium chloride    . heparin    . piperacillin-tazobactam (ZOSYN)  IV 3.375 g (07/13/17 0916)  . vancomycin Stopped (07/12/17 1250)   . carvedilol  6.25 mg Oral BID  . cinacalcet  30 mg Oral Q supper  . collagenase   Topical BID  . docusate sodium  100 mg Oral BID  . [START ON 07/14/2017] doxercalciferol  6 mcg Intravenous Q M,W,F-HD  . famotidine  20 mg Oral Daily  . feeding supplement (PRO-STAT SUGAR FREE 64)  30 mL Oral BID  . gabapentin  300 mg Oral QHS  . insulin aspart  0-9 Units Subcutaneous TID WC  . insulin glargine  3 Units Subcutaneous QHS  . midodrine  10 mg Oral Q M,W,F-HD  . multivitamin  1 tablet Oral QHS  . pantoprazole  40 mg Oral BID AC  . PARoxetine  10 mg Oral Daily  . polyethylene glycol  17 g Oral Daily  . sevelamer carbonate  800 mg Oral TID WC  . simvastatin  40 mg Oral q1800  . sucralfate  1 g Oral TID WC & HS  . thiamine  100 mg Oral Daily    Dialysis Orders: MWF - [facility]  4hrs, BFR 400, DFR 800,  EDW 98.5kg, 3K/ 2.25 Ca,   Access: L thigh AVG  Heparin 3000 Unit bolus IV qHD Hectorol 68mcg IV qHD TIW Venofer 100mg  IV qHD mircera 149mcg IV q2wks - no longer ordered, last 1/11   Assessment/Plan: 1.  L thigh cellulitis w assoc wound/ active L thigh AVG - CT indicates L thigh cellulitis, possible myositis & likely abscess.  Scheduled for abscess drainage tomorrow. On ABX. Per [facility] 2.  ESRD -  HD overnight tolerated well using L thigh AVG. Orders written for tomorrow.  3.  Hypertension/volume  - BP soft, under EDW. Will need to reassess at d/c. Titrate down as tolerated.  4.  Anemia  - Hgb 10.4. No ESA indicated at this time. Holding iron due to infection.  5.  Secondary Hyperparathyroidism -  Ca in goal. No new labs. Continue binders, VDRA, and sensipar. 6.  Nutrition -  Renal diet with fluid restrictions. Renavite.  7. H/o DVT - coumadin held for surgery, on heparin drip. Per [facility] 8. DMT2 9. GERD   Jen Mow, PA-C [facility] Pager: [phone] 07/13/2017,11:03 AM  LOS: 1 day   Pt seen, examined and agree w A/P as above. HD tomorrow, no esrd issues.  [name] MD [employer] pager [phone]   07/13/2017, 12:34 PM

## 2017-07-13 NOTE — Progress Notes (Signed)
[facility] PROGRESS NOTE  Ashlee Miles TDD:[id number] DOB: [birth year] DOA: 07/11/2017  PCP: Darlina Rumpf, MD  Brief History/Interval Summary: 77 year old female with a past medical history of end-stage renal disease on hemodialysis, hypertension, hyperlipidemia, protein C deficiency, history of pituitary adenoma, history of DVT on warfarin, type 2 diabetes, wound VAC in the left lower extremity presented with increasing pain and erythema of the left upper leg.  Patient recently underwent left thigh graft thrombectomy and had a wound with wound VAC.  Patient was hospitalized for further management.  Reason for Visit: Cellulitis and abscess, left leg  Consultants: Vascular surgery.  Nephrology.  Procedures: None yet  Antibiotics: Vancomycin and Zosyn  Subjective/Interval History: Patient continues to have pain and discomfort in the left thigh area.  Denies any other complaints at this time.    ROS: Denies any nausea or vomiting  Objective:  Vital Signs  Vitals:   07/13/17 0204 07/13/17 0409 07/13/17 0443 07/13/17 0915  BP: (!) 100/59  (!) 117/32 (!) 139/39  Pulse: 73  72   Resp:   18 16  Temp:   98.6 F (37 C) 98.5 F (36.9 C)  TempSrc:   Oral Oral  SpO2:   99% 99%  Weight:  97.3 kg (214 lb 8.1 oz)    Height:        Intake/Output Summary (Last 24 hours) at 07/13/2017 0924 Last data filed at 07/13/2017 0300 Gross per 24 hour  Intake 340 ml  Output 2300 ml  Net -1960 ml   Filed Weights   07/12/17 2040 07/13/17 0100 07/13/17 0409  Weight: 97.8 kg (215 lb 9.8 oz) 95.8 kg (211 lb 3.2 oz) 97.3 kg (214 lb 8.1 oz)    General appearance: Awake alert.  In no distress. Resp: Clear to auscultation bilaterally Cardio: S1-S2 is normal regular.  No S3-S4.  No rubs murmurs or bruit GI: Abdomen soft.  Nontender nondistended. Extremities: Dressing noted over the left anterior thigh.  Erythema noted around it.  Tender to palpation. Neurologic: No obvious focal  neurological deficits.  Lab Results:  Data Reviewed: I have personally reviewed following labs and imaging studies  CBC: Recent Labs  Lab 07/11/17 2227 07/12/17 0523  WBC 13.7* 11.7*  NEUTROABS 10.7*  --   HGB 11.0* 10.4*  HCT 34.4* 32.8*  MCV 82.9 81.6  PLT 154 118*    Basic Metabolic Panel: Recent Labs  Lab 07/11/17 2227 07/12/17 0523  NA 135 137  K 4.0 4.2  CL 98* 99*  CO2 22 25  GLUCOSE 239* 86  BUN 39* 41*  CREATININE 6.94* 7.74*  CALCIUM 8.1* 8.0*    GFR: Estimated Creatinine Clearance: 6.7 mL/min (A) (by C-G formula based on SCr of 7.74 mg/dL (H)).  Coagulation Profile: Recent Labs  Lab 07/12/17 0127 07/13/17 0441  INR 2.58 2.02   CBG: Recent Labs  Lab 07/12/17 0730 07/12/17 1438 07/12/17 1613 07/13/17 0228 07/13/17 0746  GLUCAP 88 174* 147* 134* 170*    Radiology Studies: Ct Extremity Lower Left Wo Contrast  Result Date: 07/12/2017 CLINICAL DATA:  Wound in the left thigh EXAM: CT OF THE LOWER LEFT EXTREMITY WITHOUT CONTRAST TECHNIQUE: Multidetector CT imaging of the lower left extremity was performed according to the standard protocol. COMPARISON:  None. FINDINGS: Bones/Joint/Cartilage Mild SI joint disease. Left pubic symphysis and rami appears intact. The left femur demonstrates no fracture or malalignment. No abnormal lucency, periostitis or bone destruction. Mild lateral subluxation of the patella with patellofemoral degenerative changes. Moderate arthritis of  the medial and lateral compartments of the left knee. No large left hip effusion or knee effusion. Loop graft in the left thigh with anastomosis at the left femoral vessels. Ligaments Suboptimally assessed by CT. Muscles and Tendons Quadriceps tendon and patellar tendons appear intact. No intramuscular fluid collections are seen. Slightly indistinct appearance of the distal medial vastus muscle. Soft tissues Skin thickening and moderate subcutaneous edema and soft tissue stranding involving  the medial aspect of the mid to distal thigh. Wound within the medial thigh soft tissues with small foci of gas and radiopaque material, likely packing material. Dominant fluid collection within the medial subcutaneous fat of the distal thigh just above the knee, this measures 8.2 x 2.9 cm. Vascular calcifications. IMPRESSION: 1. Deep wound within the soft tissues of the mid to distal medial thigh. Skin thickening and moderate edema and inflammation of the subcutaneous fat of the mid to distal medial thigh consistent with cellulitis. Indistinct appearance of the distal medial vastus muscle likely representing muscle edema/myositis. Dominant fluid collection within the distal thigh subcutaneous fat, just above the knee measuring 8.2 cm which may represent an inflammatory or infected fluid collection. No bone destruction, periostitis or abnormal lucency to suggest osteomyelitis by CT. 2. Arthritis of the left knee with mild lateral subluxation of the patella Electronically Signed   By: Donavan Foil M.D.   On: 07/12/2017 01:28     Medications:  Scheduled: . carvedilol  6.25 mg Oral BID  . cinacalcet  30 mg Oral Q supper  . collagenase   Topical BID  . docusate sodium  100 mg Oral BID  . [START ON 07/14/2017] doxercalciferol  6 mcg Intravenous Q M,W,F-HD  . famotidine  20 mg Oral Daily  . feeding supplement (PRO-STAT SUGAR FREE 64)  30 mL Oral BID  . gabapentin  300 mg Oral QHS  . insulin aspart  0-9 Units Subcutaneous TID WC  . insulin glargine  3 Units Subcutaneous QHS  . midodrine  10 mg Oral Q M,W,F-HD  . multivitamin  1 tablet Oral QHS  . pantoprazole  40 mg Oral BID AC  . PARoxetine  10 mg Oral Daily  . polyethylene glycol  17 g Oral Daily  . sevelamer carbonate  800 mg Oral TID WC  . simvastatin  40 mg Oral q1800  . sucralfate  1 g Oral TID WC & HS  . thiamine  100 mg Oral Daily  . Warfarin - Pharmacist Dosing Inpatient   Does not apply q1800   Continuous: . sodium chloride    .  piperacillin-tazobactam (ZOSYN)  IV 3.375 g (07/13/17 0916)  . vancomycin Stopped (07/12/17 1250)  . vancomycin     ZWC:HENIDPOEUMPNT **OR** acetaminophen, hydrALAZINE, ipratropium-albuterol, linaclotide, ondansetron **OR** ondansetron (ZOFRAN) IV, oxyCODONE-acetaminophen, senna-docusate, sodium chloride flush, triamcinolone cream, zolpidem  Assessment/Plan:  Principal Problem:   Cellulitis of left lower extremity Active Problems:   Essential hypertension   DVT (deep venous thrombosis) (HCC)   ESRD on dialysis ([place])   Anemia due to chronic kidney disease   Diabetes mellitus with end stage renal disease (HCC)   GERD (gastroesophageal reflux disease)   Protein C deficiency (HCC)   Leg wound, left   HLD (hyperlipidemia)    Cellulitis and possible abscess of the left lower extremity in the setting of chronic wound and recent surgery Patient underwent vascular surgery recently to that area.  She had a wound VAC in place.  CT scan raises concern for fluid collection.  Vascular surgery is following.  Patient is on vancomycin and Zosyn.  Follow-up on cultures.  Lactic acid level was normal.  Pro-calcitonin 8.26.  WBC was noted to be elevated.  The plan appears to be surgical drainage by vascular surgery tomorrow.  INR is being reversed.  History of DVT and protein C deficiency She is on warfarin which is being reversed for surgery tomorrow.  INR to be monitored.  Pharmacy is assisting.  History of essential hypertension Continue home medications.  Monitor blood pressures closely.  Blood pressure appears to be reasonably well controlled.  End-stage renal disease on hemodialysis (MWF) Nephrology has been consulted. Dialyzed yesterday.  Anemia chronic kidney disease Hemoglobin stable.  No evidence of overt bleeding.  Continue to monitor.  History of diabetes mellitus type 2 in the setting of end-stage renal disease Monitor CBGs.  Lantus is being continued.  History of GERD Continue  home medications.  DVT Prophylaxis: Warfarin    Code Status: DNR Family Communication: No family at bedside.  Discussed with the patient. Disposition Plan: Management as outlined above.    LOS: 1 day   [facility] Pager [phone] 07/13/2017, 9:24 AM  If 7PM-7AM, please contact night-coverage at [email or web address], password [facility]

## 2017-07-13 NOTE — H&P (View-Only) (Signed)
[facility]   Subjective  - She is comfortable.   Objective (!) 117/32 72 98.6 F (37 C) (Oral) 18 99%  Intake/Output Summary (Last 24 hours) at 07/13/2017 0743 Last data filed at 07/13/2017 0300 Gross per 24 hour  Intake 340 ml  Output 2300 ml  Net -1960 ml    Dry dressing over stick sites lateral left thigh graft. Santyl wet to dry dressing to open thigh wound  CT IMPRESSION: 1. Deep wound within the soft tissues of the mid to distal medial thigh. Skin thickening and moderate edema and inflammation of the subcutaneous fat of the mid to distal medial thigh consistent with cellulitis. Indistinct appearance of the distal medial vastus muscle likely representing muscle edema/myositis. Dominant fluid collection within the distal thigh subcutaneous fat, just above the knee measuring 8.2 cm which may represent an inflammatory or infected fluid collection.  Assessment/Planning: Left medial thigh wound s/p hematoma evacuation with cellulitis s/p pseudoaneurysm   CT scan show fluid collection in the distal medial thigh.  This does not appear to communicate with the open wound or the graft at this time.  The fluid collection is just above the left knee.    I don't think the fluid collection is related to the recent problems with the graft at this time.  I recommend continued IV antibiotics for the cellulitis and BID santyl dressing changes to the open thigh wound.    Ashlee Miles 07/13/2017 7:43 AM --  Laboratory Lab Results: Recent Labs    07/11/17 2227 07/12/17 0523  WBC 13.7* 11.7*  HGB 11.0* 10.4*  HCT 34.4* 32.8*  PLT 154 118*   BMET Recent Labs    07/11/17 2227 07/12/17 0523  NA 135 137  K 4.0 4.2  CL 98* 99*  CO2 22 25  GLUCOSE 239* 86  BUN 39* 41*  CREATININE 6.94* 7.74*  CALCIUM 8.1* 8.0*    COAG Lab Results  Component Value Date   INR 2.02 07/13/2017   INR 2.58 07/12/2017   INR 2.29 06/24/2017   No results  found for: PTT  Addendum  I have independently interviewed and examined the patient, and I agree with the physician assistant's findings.  Given the physical findings, the fluid collection on the CT is likely an abscess so drainage is recommended.  - Reverse anticoagulation (3 u FFP) - Start Heparin drip - Hold Warfarin - Recheck INR in AM - Will schedule for tomorrow with either Dr. Scot Dock or Dr. Loanne Drilling, MD, [facility] [place] Office: [phone] Pager: [phone]  07/13/2017, 9:12 AM

## 2017-07-13 NOTE — Progress Notes (Signed)
ANTICOAGULATION CONSULT NOTE - Follow Up Consult  Pharmacy Consult for Heparin (while Coumadin on hold) Indication: history of DVT, protein C deficiency  No Active Allergies  Patient Measurements: Height: 5\' 2"  (157.5 cm) Weight: 214 lb 8.1 oz (97.3 kg) IBW/kg (Calculated) : 50.1 Heparin Dosing Weight: 73 kg  Vital Signs: Temp: 98.7 F (37.1 C) (01/24 2112) Temp Source: Oral (01/24 2112) BP: 128/32 (01/24 2112) Pulse Rate: 72 (01/24 2112)  Labs: Recent Labs    07/11/17 2227 07/12/17 0127 07/12/17 0523 07/13/17 0441 07/13/17 2050  HGB 11.0*  --  10.4*  --   --   HCT 34.4*  --  32.8*  --   --   PLT 154  --  118*  --   --   LABPROT  --  27.5*  --  22.7*  --   INR  --  2.58  --  2.02  --   HEPARINUNFRC  --   --   --   --  0.20*  CREATININE 6.94*  --  7.74*  --   --     Estimated Creatinine Clearance: 6.7 mL/min (A) (by C-G formula based on SCr of 7.74 mg/dL (H)).   Medications:  Infusions:  . heparin 1,150 Units/hr (07/13/17 1210)  . piperacillin-tazobactam (ZOSYN)  IV Stopped (07/13/17 1316)  . vancomycin Stopped (07/12/17 1250)    Assessment: 77 yo F on Coumadin PTA for hx of DVT and protein C deficiency.  Pt is admitted with LLE cellulitis.  CT scan noted fluid collection on LLE concerning for abscess / infection with plans for drainage.  Coumadin reversed for procedure and IV heparin initiated.   Heparin level is sub-therapeutic at 0.20 on 1150 units/hr.  No bleeding reported.   Goal of Therapy:  Heparin level 0.3-0.7 units/ml Monitor platelets by anticoagulation protocol: Yes   Plan:  Increase heparin to 1350 units/hr.  Recheck heparin level in 8 hours - ok with AM labs.  Follow-up daily heparin level and CBC.   Sloan Leiter, PharmD, BCPS, BCCCP Clinical Pharmacist Clinical phone 07/13/2017 until [phone] After hours, please call [phone] 07/13/2017,9:44 PM

## 2017-07-14 ENCOUNTER — Encounter (HOSPITAL_COMMUNITY)
Admission: EM | Disposition: A | Discharge status: Skilled Nursing Facility | Payer: Self-pay | Source: Home / Self Care | Attending: Internal Medicine

## 2017-07-14 ENCOUNTER — Encounter (HOSPITAL_COMMUNITY): Payer: Self-pay | Admitting: Anesthesiology

## 2017-07-14 ENCOUNTER — Inpatient Hospital Stay (HOSPITAL_COMMUNITY): Payer: 59 | Admitting: Anesthesiology

## 2017-07-14 DIAGNOSIS — L03116 Cellulitis of left lower limb: Secondary | ICD-10-CM

## 2017-07-14 DIAGNOSIS — L02416 Cutaneous abscess of left lower limb: Secondary | ICD-10-CM

## 2017-07-14 HISTORY — PX: I & D EXTREMITY: SHX5045

## 2017-07-14 LAB — CBC
HCT: 31.2 % — ABNORMAL LOW (ref 36.0–46.0)
HCT: 33.9 % — ABNORMAL LOW (ref 36.0–46.0)
Hemoglobin: 10.3 g/dL — ABNORMAL LOW (ref 12.0–15.0)
Hemoglobin: 10.7 g/dL — ABNORMAL LOW (ref 12.0–15.0)
MCH: 27 pg (ref 26.0–34.0)
MCH: 25.8 pg — ABNORMAL LOW (ref 26.0–34.0)
MCHC: 33 g/dL (ref 30.0–36.0)
MCHC: 31.6 g/dL (ref 30.0–36.0)
MCV: 81.7 fL (ref 78.0–100.0)
MCV: 81.9 fL (ref 78.0–100.0)
Platelets: 146 10*3/uL — ABNORMAL LOW (ref 150–400)
Platelets: 175 10*3/uL (ref 150–400)
RBC: 3.82 MIL/uL — ABNORMAL LOW (ref 3.87–5.11)
RBC: 4.14 MIL/uL (ref 3.87–5.11)
RDW: 18 % — ABNORMAL HIGH (ref 11.5–15.5)
RDW: 17.5 % — ABNORMAL HIGH (ref 11.5–15.5)
WBC: 7.5 10*3/uL (ref 4.0–10.5)
WBC: 7.1 10*3/uL (ref 4.0–10.5)

## 2017-07-14 LAB — RENAL FUNCTION PANEL
Albumin: 2.9 g/dL — ABNORMAL LOW (ref 3.5–5.0)
Anion gap: 11 (ref 5–15)
BUN: 7 mg/dL (ref 6–20)
CO2: 28 mmol/L (ref 22–32)
Calcium: 7.4 mg/dL — ABNORMAL LOW (ref 8.9–10.3)
Chloride: 96 mmol/L — ABNORMAL LOW (ref 101–111)
Creatinine, Ser: 2.71 mg/dL — ABNORMAL HIGH (ref 0.44–1.00)
GFR calc Af Amer: 19 mL/min — ABNORMAL LOW (ref >60)
GFR calc non Af Amer: 16 mL/min — ABNORMAL LOW (ref >60)
Glucose, Bld: 187 mg/dL — ABNORMAL HIGH (ref 65–99)
Phosphorus: 2.1 mg/dL — ABNORMAL LOW (ref 2.5–4.6)
Potassium: 3.3 mmol/L — ABNORMAL LOW (ref 3.5–5.1)
Sodium: 135 mmol/L (ref 135–145)

## 2017-07-14 LAB — BASIC METABOLIC PANEL
Anion gap: 15 (ref 5–15)
BUN: 31 mg/dL — ABNORMAL HIGH (ref 6–20)
CO2: 25 mmol/L (ref 22–32)
Calcium: 7.8 mg/dL — ABNORMAL LOW (ref 8.9–10.3)
Chloride: 96 mmol/L — ABNORMAL LOW (ref 101–111)
Creatinine, Ser: 5.72 mg/dL — ABNORMAL HIGH (ref 0.44–1.00)
GFR calc Af Amer: 8 mL/min — ABNORMAL LOW (ref >60)
GFR calc non Af Amer: 6 mL/min — ABNORMAL LOW (ref >60)
Glucose, Bld: 124 mg/dL — ABNORMAL HIGH (ref 65–99)
Potassium: 3.7 mmol/L (ref 3.5–5.1)
Sodium: 136 mmol/L (ref 135–145)

## 2017-07-14 LAB — HEPARIN LEVEL (UNFRACTIONATED): Heparin Unfractionated: 0.5 IU/mL (ref 0.30–0.70)

## 2017-07-14 LAB — BPAM FFP
Blood Product Expiration Date: 201901292359
Blood Product Expiration Date: 201901292359
Blood Product Expiration Date: 201901292359
ISSUE DATE / TIME: 201901241504
ISSUE DATE / TIME: 201901241644
ISSUE DATE / TIME: 201901242140
Unit Type and Rh: 1700
Unit Type and Rh: 1700
Unit Type and Rh: 7300

## 2017-07-14 LAB — PREPARE FRESH FROZEN PLASMA
Unit division: 0
Unit division: 0
Unit division: 0

## 2017-07-14 LAB — GLUCOSE, CAPILLARY
Glucose-Capillary: 126 mg/dL — ABNORMAL HIGH (ref 65–99)
Glucose-Capillary: 106 mg/dL — ABNORMAL HIGH (ref 65–99)

## 2017-07-14 LAB — PROTIME-INR
INR: 1.72
Prothrombin Time: 20 seconds — ABNORMAL HIGH (ref 11.4–15.2)

## 2017-07-14 SURGERY — IRRIGATION AND DEBRIDEMENT EXTREMITY
Anesthesia: General | Laterality: Left

## 2017-07-14 MED ORDER — SODIUM CHLORIDE 0.9 % IV SOLN
INTRAVENOUS | Status: DC | PRN
  Administered 2017-07-14: 07:00:00 via INTRAVENOUS

## 2017-07-14 MED ORDER — PROPOFOL 10 MG/ML IV BOLUS
INTRAVENOUS | Status: DC | PRN
  Administered 2017-07-14: 140 mg via INTRAVENOUS

## 2017-07-14 MED ORDER — FENTANYL CITRATE (PF) 100 MCG/2ML IJ SOLN
INTRAMUSCULAR | Status: DC | PRN
  Administered 2017-07-14 (×3): 50 ug via INTRAVENOUS

## 2017-07-14 MED ORDER — PENTAFLUOROPROP-TETRAFLUOROETH EX AERO: 1.0000 "application " | INHALATION_SPRAY | CUTANEOUS | Status: DC | PRN

## 2017-07-14 MED ORDER — PROPOFOL 10 MG/ML IV BOLUS
INTRAVENOUS | Status: AC
  Filled 2017-07-14: qty 40

## 2017-07-14 MED ORDER — HEPARIN (PORCINE) IN NACL 100-0.45 UNIT/ML-% IJ SOLN
1350.0000 [IU]/h | INTRAMUSCULAR | Status: DC
  Administered 2017-07-14 – 2017-07-16 (×3): 1350 [IU]/h via INTRAVENOUS
  Filled 2017-07-14 (×3): qty 250

## 2017-07-14 MED ORDER — FENTANYL CITRATE (PF) 100 MCG/2ML IJ SOLN: 25.0000 ug | INTRAMUSCULAR | Status: DC | PRN

## 2017-07-14 MED ORDER — LIDOCAINE HCL (PF) 1 % IJ SOLN: 5.0000 mL | INTRAMUSCULAR | Status: DC | PRN

## 2017-07-14 MED ORDER — ONDANSETRON HCL 4 MG/2ML IJ SOLN
INTRAMUSCULAR | Status: DC | PRN
  Administered 2017-07-14: 4 mg via INTRAVENOUS

## 2017-07-14 MED ORDER — WARFARIN SODIUM 7.5 MG PO TABS
7.5000 mg | ORAL_TABLET | Freq: Once | ORAL | Status: AC
  Administered 2017-07-14: 7.5 mg via ORAL
  Filled 2017-07-14: qty 1

## 2017-07-14 MED ORDER — FENTANYL CITRATE (PF) 250 MCG/5ML IJ SOLN
INTRAMUSCULAR | Status: AC
  Filled 2017-07-14: qty 5

## 2017-07-14 MED ORDER — HEPARIN SODIUM (PORCINE) 1000 UNIT/ML DIALYSIS: 3000.0000 [IU] | INTRAMUSCULAR | Status: DC | PRN

## 2017-07-14 MED ORDER — SODIUM CHLORIDE 0.9 % IV SOLN: 100.0000 mL | INTRAVENOUS | Status: DC | PRN

## 2017-07-14 MED ORDER — WARFARIN - PHARMACIST DOSING INPATIENT
Freq: Every day | Status: DC
  Administered 2017-07-14 – 2017-07-20 (×4)

## 2017-07-14 MED ORDER — FENTANYL CITRATE (PF) 100 MCG/2ML IJ SOLN
25.0000 ug | INTRAMUSCULAR | Status: DC | PRN
  Administered 2017-07-14 (×2): 50 ug via INTRAVENOUS

## 2017-07-14 MED ORDER — LIDOCAINE-PRILOCAINE 2.5-2.5 % EX CREA: 1.0000 "application " | TOPICAL_CREAM | CUTANEOUS | Status: DC | PRN

## 2017-07-14 MED ORDER — SUCCINYLCHOLINE CHLORIDE 200 MG/10ML IV SOSY
PREFILLED_SYRINGE | INTRAVENOUS | Status: DC | PRN
  Administered 2017-07-14: 100 mg via INTRAVENOUS

## 2017-07-14 MED ORDER — HEPARIN SODIUM (PORCINE) 1000 UNIT/ML DIALYSIS
1000.0000 [IU] | INTRAMUSCULAR | Status: DC | PRN
  Filled 2017-07-14: qty 1

## 2017-07-14 MED ORDER — VANCOMYCIN HCL IN DEXTROSE 750-5 MG/150ML-% IV SOLN
INTRAVENOUS | Status: AC
  Filled 2017-07-14: qty 150

## 2017-07-14 MED ORDER — HEPARIN SODIUM (PORCINE) 1000 UNIT/ML DIALYSIS: 1000.0000 [IU] | INTRAMUSCULAR | Status: DC | PRN

## 2017-07-14 MED ORDER — ALTEPLASE 2 MG IJ SOLR: 2.0000 mg | Freq: Once | INTRAMUSCULAR | Status: DC | PRN

## 2017-07-14 MED ORDER — MIDAZOLAM HCL 2 MG/2ML IJ SOLN
INTRAMUSCULAR | Status: AC
  Filled 2017-07-14: qty 2

## 2017-07-14 MED ORDER — LIDOCAINE HCL (CARDIAC) 20 MG/ML IV SOLN
INTRAVENOUS | Status: DC | PRN
  Administered 2017-07-14: 60 mg via INTRAVENOUS

## 2017-07-14 MED ORDER — PHENYLEPHRINE 40 MCG/ML (10ML) SYRINGE FOR IV PUSH (FOR BLOOD PRESSURE SUPPORT)
PREFILLED_SYRINGE | INTRAVENOUS | Status: DC | PRN
  Administered 2017-07-14: 80 ug via INTRAVENOUS
  Administered 2017-07-14: 120 ug via INTRAVENOUS
  Administered 2017-07-14 (×2): 80 ug via INTRAVENOUS

## 2017-07-14 MED ORDER — EPHEDRINE SULFATE-NACL 50-0.9 MG/10ML-% IV SOSY
PREFILLED_SYRINGE | INTRAVENOUS | Status: DC | PRN
  Administered 2017-07-14 (×2): 10 mg via INTRAVENOUS

## 2017-07-14 MED ORDER — CARVEDILOL 3.125 MG PO TABS
3.1250 mg | ORAL_TABLET | Freq: Two times a day (BID) | ORAL | Status: DC
  Administered 2017-07-15 – 2017-07-20 (×8): 3.125 mg via ORAL
  Filled 2017-07-14 (×11): qty 1

## 2017-07-14 MED ORDER — 0.9 % SODIUM CHLORIDE (POUR BTL) OPTIME
TOPICAL | Status: DC | PRN
  Administered 2017-07-14: 1000 mL

## 2017-07-14 MED ORDER — LIDOCAINE-PRILOCAINE 2.5-2.5 % EX CREA
1.0000 "application " | TOPICAL_CREAM | CUTANEOUS | Status: DC | PRN
  Filled 2017-07-14: qty 5

## 2017-07-14 MED ORDER — FENTANYL CITRATE (PF) 100 MCG/2ML IJ SOLN
INTRAMUSCULAR | Status: AC
  Filled 2017-07-14: qty 2

## 2017-07-14 MED ORDER — LIDOCAINE HCL (PF) 1 % IJ SOLN
5.0000 mL | INTRAMUSCULAR | Status: DC | PRN
  Filled 2017-07-14: qty 5

## 2017-07-14 SURGICAL SUPPLY — 39 items
BANDAGE ACE 4X5 VEL STRL LF (GAUZE/BANDAGES/DRESSINGS) IMPLANT
BANDAGE ACE 6X5 VEL STRL LF (GAUZE/BANDAGES/DRESSINGS) IMPLANT
BNDG CONFORM 3 STRL LF (GAUZE/BANDAGES/DRESSINGS) ×2 IMPLANT
BNDG GAUZE ELAST 4 BULKY (GAUZE/BANDAGES/DRESSINGS) IMPLANT
CANISTER SUCT 3000ML PPV (MISCELLANEOUS) ×2 IMPLANT
COVER SURGICAL LIGHT HANDLE (MISCELLANEOUS) ×2 IMPLANT
DRAPE HALF SHEET 40X57 (DRAPES) ×2 IMPLANT
DRAPE INCISE IOBAN 66X45 STRL (DRAPES) IMPLANT
DRAPE ORTHO SPLIT 77X108 STRL (DRAPES)
DRAPE SURG ORHT 6 SPLT 77X108 (DRAPES) IMPLANT
DRSG MEPILEX BORDER 4X4 (GAUZE/BANDAGES/DRESSINGS) ×2 IMPLANT
DRSG MEPILEX BORDER 4X8 (GAUZE/BANDAGES/DRESSINGS) ×2 IMPLANT
ELECT REM PT RETURN 9FT ADLT (ELECTROSURGICAL) ×2
ELECTRODE REM PT RTRN 9FT ADLT (ELECTROSURGICAL) ×1 IMPLANT
GAUZE SPONGE 4X4 12PLY STRL (GAUZE/BANDAGES/DRESSINGS) ×2 IMPLANT
GAUZE SPONGE 4X4 12PLY STRL LF (GAUZE/BANDAGES/DRESSINGS) ×2 IMPLANT
GLOVE BIO SURGEON STRL SZ7 (GLOVE) ×2 IMPLANT
GLOVE BIO SURGEON STRL SZ7.5 (GLOVE) ×2 IMPLANT
GLOVE BIOGEL PI IND STRL 8 (GLOVE) ×1 IMPLANT
GLOVE BIOGEL PI INDICATOR 8 (GLOVE) ×1
GOWN STRL REUS W/ TWL LRG LVL3 (GOWN DISPOSABLE) ×3 IMPLANT
GOWN STRL REUS W/ TWL XL LVL3 (GOWN DISPOSABLE) ×1 IMPLANT
GOWN STRL REUS W/TWL LRG LVL3 (GOWN DISPOSABLE) ×3
GOWN STRL REUS W/TWL XL LVL3 (GOWN DISPOSABLE) ×1
KIT BASIN OR (CUSTOM PROCEDURE TRAY) ×2 IMPLANT
KIT ROOM TURNOVER OR (KITS) ×2 IMPLANT
NS IRRIG 1000ML POUR BTL (IV SOLUTION) ×2 IMPLANT
PACK CV ACCESS (CUSTOM PROCEDURE TRAY) IMPLANT
PACK GENERAL/GYN (CUSTOM PROCEDURE TRAY) IMPLANT
PAD ARMBOARD 7.5X6 YLW CONV (MISCELLANEOUS) ×4 IMPLANT
SUT ETHILON 3 0 PS 1 (SUTURE) IMPLANT
SUT VIC AB 2-0 CTB1 (SUTURE) IMPLANT
SUT VIC AB 3-0 SH 27 (SUTURE)
SUT VIC AB 3-0 SH 27X BRD (SUTURE) IMPLANT
SUT VICRYL 4-0 PS2 18IN ABS (SUTURE) IMPLANT
SWAB COLLECTION DEVICE MRSA (MISCELLANEOUS) ×2 IMPLANT
SWAB CULTURE ESWAB REG 1ML (MISCELLANEOUS) ×2 IMPLANT
TOWEL GREEN STERILE (TOWEL DISPOSABLE) ×2 IMPLANT
WATER STERILE IRR 1000ML POUR (IV SOLUTION) ×2 IMPLANT

## 2017-07-14 NOTE — Op Note (Signed)
    NAME: Ashlee Miles    MRN: [id number] DOB: 15-Sep-1940    DATE OF OPERATION: 07/14/2017  PREOP DIAGNOSIS:    Abscess left thigh  POSTOP DIAGNOSIS:    Same  PROCEDURE:    1.  Incision and drainage of abscess left thigh 2.  Excisional debridement of wound left thigh (subcutaneous tissue)  SURGEON: Judeth Cornfield. Scot Dock, MD, FACS  ASSIST: None  ANESTHESIA: General  EBL: Minimal  INDICATIONS:    Ashlee Miles is a 77 y.o. female who had a hematoma evacuated by Dr. Trula Slade.  She subsequently developed an abscess distal to that area.  I was asked to I&D this.  FINDINGS:   Large amount of purulent fluid was obtained and a culture was sent.  TECHNIQUE:   The patient was taken to the operating room and received a general anesthetic.  The left thigh was prepped and draped in the usual sterile fashion.  The area of the abscess was identified and an incision was made over this area until the abscess was entered.  There is a large amount of purulent material which was drained.  Intraoperative cultures were obtained both aerobic and anaerobic cultures.  The wound was irrigated with copious amounts of saline.  This was then packed with a Kerlix.  The open wound was then debrided with scissors.  Subcutaneous tissue was debrided.  This wound was packed.  The patient tolerated the procedure well was transferred to the recovery room in stable condition.  All needle and sponge counts were correct.  [name], MD, FACS [facility]  DATE OF DICTATION:   07/14/2017

## 2017-07-14 NOTE — Anesthesia Postprocedure Evaluation (Signed)
Anesthesia Post Note  Patient: Ashlee Miles  Procedure(s) Performed: IRRIGATION AND DEBRIDEMENT DISTAL THIGH ABSCESS (Left )     Patient location during evaluation: PACU Anesthesia Type: General Level of consciousness: awake Pain management: pain level controlled Vital Signs Assessment: post-procedure vital signs reviewed and stable Respiratory status: spontaneous breathing Cardiovascular status: stable Anesthetic complications: no    Last Vitals:  Vitals:   07/14/17 0844 07/14/17 0900  BP: (!) 91/31 (!) 114/40  Pulse: 77 76  Resp: 20 16  Temp:    SpO2: 99% 100%    Last Pain:  Vitals:   07/14/17 0900  TempSrc:   PainSc: 10-Worst pain ever                 Kayne Yuhas

## 2017-07-14 NOTE — Transfer of Care (Signed)
Immediate Anesthesia Transfer of Care Note  Patient: Ashlee Miles  Procedure(s) Performed: IRRIGATION AND DEBRIDEMENT DISTAL THIGH ABSCESS (Left )  Patient Location: PACU  Anesthesia Type:General  Level of Consciousness: awake, alert , oriented and sedated  Airway & Oxygen Therapy: Patient Spontanous Breathing and Patient connected to nasal cannula oxygen  Post-op Assessment: Report given to RN, Post -op Vital signs reviewed and stable and Patient moving all extremities  Post vital signs: Reviewed and stable  Last Vitals:  Vitals:   07/13/17 2345 07/14/17 0506  BP: (!) 121/38 (!) 136/97  Pulse: 76 71  Resp: 18 18  Temp: 37.2 C 37.1 C  SpO2: 99% 98%    Last Pain:  Vitals:   07/14/17 0506  TempSrc: Oral  PainSc:       Patients Stated Pain Goal: 3 (16/10/96 0454)  Complications: No apparent anesthesia complications

## 2017-07-14 NOTE — Progress Notes (Signed)
[facility] PROGRESS NOTE  Ashlee Miles JKD:[id number] DOB: June 09, 1941 DOA: 07/11/2017  PCP: Darlina Rumpf, MD  Brief History/Interval Summary: 77 year old female with a past medical history of end-stage renal disease on hemodialysis, hypertension, hyperlipidemia, protein C deficiency, history of pituitary adenoma, history of DVT on warfarin, type 2 diabetes, wound VAC in the left lower extremity presented with increasing pain and erythema of the left upper leg.  Patient recently underwent left thigh graft thrombectomy and had a wound with wound VAC.  Patient was hospitalized for further management.  CT scan shows fluid collection in the left lower extremity.  Patient was taken to the OR for drainage.  Reason for Visit: Cellulitis and abscess, left leg  Consultants: Vascular surgery.  Nephrology.  Procedures:  1.  Incision and drainage of abscess left thigh 2.  Excisional debridement of wound left thigh (subcutaneous tissue)    Antibiotics: Vancomycin and Zosyn  Subjective/Interval History: Patient seen postoperatively.  States that she is feeling well.  Denies any significant pain in the left leg but it is sore.  Denies any dizziness or lightheadedness.  No chest pain shortness of breath.    ROS: Denies any nausea or vomiting  Objective:  Vital Signs  Vitals:   07/14/17 1056 07/14/17 1100 07/14/17 1130 07/14/17 1200  BP: (!) 108/51 121/69 (!) 97/40 (!) 86/39  Pulse: 71 70 67 66  Resp: 15 16 16 16   Temp:      TempSrc:      SpO2:      Weight:      Height:        Intake/Output Summary (Last 24 hours) at 07/14/2017 1320 Last data filed at 07/14/2017 0900 Gross per 24 hour  Intake 2458.69 ml  Output 51 ml  Net 2407.69 ml   Filed Weights   07/13/17 0409 07/14/17 0500 07/14/17 1044  Weight: 97.3 kg (214 lb 8.1 oz) 98.6 kg (217 lb 6 oz) 96.9 kg (213 lb 10 oz)    General appearance: Awake alert.  In no distress Resp: Clear to auscultation bilaterally Cardio:  S1-S2 normal regular GI: Abdomen is soft.  Nontender nondistended Extremities: Left lower extremity covered in dressing. Neurologic: No obvious focal neurological deficits.  Lab Results:  Data Reviewed: I have personally reviewed following labs and imaging studies  CBC: Recent Labs  Lab 07/11/17 2227 07/12/17 0523 07/14/17 0447  WBC 13.7* 11.7* 7.5  NEUTROABS 10.7*  --   --   HGB 11.0* 10.4* 10.3*  HCT 34.4* 32.8* 31.2*  MCV 82.9 81.6 81.7  PLT 154 118* 146*    Basic Metabolic Panel: Recent Labs  Lab 07/11/17 2227 07/12/17 0523 07/14/17 0447  NA 135 137 136  K 4.0 4.2 3.7  CL 98* 99* 96*  CO2 22 25 25   GLUCOSE 239* 86 124*  BUN 39* 41* 31*  CREATININE 6.94* 7.74* 5.72*  CALCIUM 8.1* 8.0* 7.8*    GFR: Estimated Creatinine Clearance: 9.1 mL/min (A) (by C-G formula based on SCr of 5.72 mg/dL (H)).  Coagulation Profile: Recent Labs  Lab 07/12/17 0127 07/13/17 0441 07/14/17 0447  INR 2.58 2.02 1.72   CBG: Recent Labs  Lab 07/13/17 0746 07/13/17 1204 07/13/17 1642 07/13/17 2113 07/14/17 0833  GLUCAP 170* 92 200* 128* 126*    Radiology Studies: No results found.   Medications:  Scheduled: . carvedilol  6.25 mg Oral BID  . cinacalcet  30 mg Oral Q supper  . collagenase   Topical BID  . docusate sodium  100 mg  Oral BID  . doxercalciferol  6 mcg Intravenous Q M,W,F-HD  . famotidine  20 mg Oral Daily  . feeding supplement (PRO-STAT SUGAR FREE 64)  30 mL Oral BID  . fentaNYL      . gabapentin  300 mg Oral QHS  . insulin aspart  0-9 Units Subcutaneous TID WC  . insulin glargine  3 Units Subcutaneous QHS  . midodrine  10 mg Oral Q M,W,F-HD  . multivitamin  1 tablet Oral QHS  . pantoprazole  40 mg Oral BID AC  . PARoxetine  10 mg Oral Daily  . polyethylene glycol  17 g Oral Daily  . sevelamer carbonate  800 mg Oral TID WC  . simvastatin  40 mg Oral q1800  . sucralfate  1 g Oral TID WC & HS  . thiamine  100 mg Oral Daily   Continuous: . heparin  Stopped (07/14/17 0603)  . piperacillin-tazobactam (ZOSYN)  IV Stopped (07/14/17 0456)  . vancomycin Stopped (07/12/17 1250)   FKC:LEXNTZGYFVCBS **OR** acetaminophen, hydrALAZINE, ipratropium-albuterol, linaclotide, ondansetron **OR** ondansetron (ZOFRAN) IV, oxyCODONE-acetaminophen, senna-docusate, sodium chloride flush, triamcinolone cream, zolpidem  Assessment/Plan:  Principal Problem:   Cellulitis of left lower extremity Active Problems:   Essential hypertension   DVT (deep venous thrombosis) (HCC)   ESRD on dialysis ([place])   Anemia due to chronic kidney disease   Diabetes mellitus with end stage renal disease (HCC)   GERD (gastroesophageal reflux disease)   Protein C deficiency (HCC)   Leg wound, left   HLD (hyperlipidemia)    Cellulitis and possible abscess of the left lower extremity in the setting of chronic wound and recent surgery Patient underwent vascular surgery recently to that area.  She had a wound VAC in place.  CT scan raises concern for fluid collection.  Vascular surgery is following.  Patient was placed on vancomycin and Zosyn.  Patient taken to the operating room on 1/25 for surgical drainage.  Seems to be stable postoperatively.  Lactic acid level was normal.  Pro-calcitonin 8.26.  WBC was noted to be elevated and is normal today.  Change antibiotics based on culture data when available.  Blood cultures negative so far.  Await cultures sent today after surgical drainage. MRSA PCR has been negative previously.  History of DVT and protein C deficiency She was on warfarin which was reversed for surgery.  She is on IV heparin.  Resume warfarin when cleared to do so by vascular surgery.   History of essential hypertension Continue home medications.  Monitor blood pressures closely.  Blood Pressure noted to be low at hemodialysis.  May need to cut back on carvedilol.  Is also noted to be on midodrine.  End-stage renal disease on hemodialysis (MWF) Nephrology is  following.  Anemia chronic kidney disease Hemoglobin stable.  No evidence of overt bleeding.  Continue to monitor.  History of diabetes mellitus type 2 in the setting of end-stage renal disease Monitor CBGs.  Lantus is being continued.  History of GERD Continue home medications.  DVT Prophylaxis: Currently on IV heparin Code Status: DNR Family Communication: Discussed with the patient Disposition Plan: Management as outlined above.  Further plans per vascular surgery.  Await culture data.    LOS: 2 days   [facility] [facility] Pager [phone] 07/14/2017, 1:20 PM  If 7PM-7AM, please contact night-coverage at [email or web address], password [facility]

## 2017-07-14 NOTE — Progress Notes (Signed)
ANTICOAGULATION CONSULT NOTE - Follow Up Consult  Pharmacy Consult for Heparin and Coumadin Indication: history of DVT, protein C deficiency  No Active Allergies  Patient Measurements: Height: 5\' 2"  (157.5 cm) Weight: 213 lb 10 oz (96.9 kg) IBW/kg (Calculated) : 50.1 Heparin Dosing Weight: 73 kg  Vital Signs: Temp: 97.6 F (36.4 C) (01/25 1044) Temp Source: Oral (01/25 1044) BP: 86/39 (01/25 1200) Pulse Rate: 66 (01/25 1200)  Labs: Recent Labs    07/11/17 2227 07/12/17 0127 07/12/17 0523 07/13/17 0441 07/13/17 2050 07/14/17 0447  HGB 11.0*  --  10.4*  --   --  10.3*  HCT 34.4*  --  32.8*  --   --  31.2*  PLT 154  --  118*  --   --  146*  LABPROT  --  27.5*  --  22.7*  --  20.0*  INR  --  2.58  --  2.02  --  1.72  HEPARINUNFRC  --   --   --   --  0.20* 0.50  CREATININE 6.94*  --  7.74*  --   --  5.72*    Estimated Creatinine Clearance: 9.1 mL/min (A) (by C-G formula based on SCr of 5.72 mg/dL (H)).   Medications:  Infusions:  . heparin Stopped (07/14/17 0603)  . piperacillin-tazobactam (ZOSYN)  IV Stopped (07/14/17 0456)  . vancomycin Stopped (07/12/17 1250)    Assessment: 77 yo F on Coumadin PTA for hx of DVT and protein C deficiency.  Pt is admitted with LLE cellulitis.  CT scan noted fluid collection on LLE concerning for abscess / infection with plans for drainage.  Coumadin reversed for procedure and IV heparin initiated.  Heparin was held at 0600 this AM in anticipation of surgery.  Pt is now post-op.  Spoke with Dr. Scot Dock who advised that heparin could resume 8 hours post-op and Coumadin could also begin this evening.  Heparin was previously therapeutic on 1350 units/hr.  Goal of Therapy:  Heparin level 0.3-0.7 units/ml Monitor platelets by anticoagulation protocol: Yes   Plan:  Restart heparin at 1350 units/hr (at 1630 today) Coumadin 7.5mg  PO x 1 tonight Follow-up daily heparin level, INR, and CBC.   [employer], Pharm.D., BCPS Clinical  Pharmacist Pager: [phone] Clinical phone for 07/14/2017 from 8:30-4:00 is [phone]. After 4pm, please call Main [facility] [phone]) for assistance. 07/14/2017 1:29 PM

## 2017-07-14 NOTE — Anesthesia Preprocedure Evaluation (Signed)
Anesthesia Evaluation  Patient identified by MRN, date of birth, ID band Patient awake    Reviewed: Allergy & Precautions, NPO status , Patient's Chart, lab work & pertinent test results  Airway Mallampati: II  TM Distance: >3 FB     Dental   Pulmonary    breath sounds clear to auscultation       Cardiovascular hypertension, +CHF   Rhythm:Regular Rate:Normal     Neuro/Psych    GI/Hepatic Neg liver ROS, GERD  ,  Endo/Other  diabetes  Renal/GU Renal disease     Musculoskeletal   Abdominal   Peds  Hematology   Anesthesia Other Findings   Reproductive/Obstetrics                             Anesthesia Physical Anesthesia Plan  ASA: III  Anesthesia Plan: General   Post-op Pain Management:    Induction: Intravenous  PONV Risk Score and Plan: 3 and Treatment may vary due to age or medical condition, Ondansetron and Midazolam  Airway Management Planned:   Additional Equipment:   Intra-op Plan:   Post-operative Plan: Extubation in OR  Informed Consent: I have reviewed the patients History and Physical, chart, labs and discussed the procedure including the risks, benefits and alternatives for the proposed anesthesia with the patient or authorized representative who has indicated his/her understanding and acceptance.   Dental advisory given  Plan Discussed with: CRNA and Anesthesiologist  Anesthesia Plan Comments:         Anesthesia Quick Evaluation

## 2017-07-14 NOTE — Progress Notes (Signed)
ANTICOAGULATION CONSULT NOTE - Follow Up Consult  Pharmacy Consult for heparin Indication: h/o DVT w/ protein C deficiency  Labs: Recent Labs    07/11/17 2227 07/12/17 0127 07/12/17 0523 07/13/17 0441 07/13/17 2050 07/14/17 0447  HGB 11.0*  --  10.4*  --   --   --   HCT 34.4*  --  32.8*  --   --   --   PLT 154  --  118*  --   --   --   LABPROT  --  27.5*  --  22.7*  --  20.0*  INR  --  2.58  --  2.02  --  1.72  HEPARINUNFRC  --   --   --   --  0.20* 0.50  CREATININE 6.94*  --  7.74*  --   --  5.72*    Assessment/Plan:  77yo female therapeutic on heparin after rate change, now in OR. Will f/u after procedure.   Wynona Neat, PharmD, BCPS  07/14/2017,6:34 AM

## 2017-07-14 NOTE — Interval H&P Note (Signed)
History and Physical Interval Note:  07/14/2017 7:05 AM  Ashlee Miles  has presented today for surgery, with the diagnosis of thigh abscess  The various methods of treatment have been discussed with the patient and family. After consideration of risks, benefits and other options for treatment, the patient has consented to  Procedure(s): IRRIGATION AND DEBRIDEMENT DISTAL THIGH ABSCESS (Left) as a surgical intervention .  The patient's history has been reviewed, patient examined, no change in status, stable for surgery.  I have reviewed the patient's chart and labs.  Questions were answered to the patient's satisfaction.     [name]

## 2017-07-14 NOTE — Anesthesia Procedure Notes (Signed)
Procedure Name: Intubation Date/Time: 07/14/2017 7:40 AM Performed by: Scheryl Darter, CRNA Pre-anesthesia Checklist: Patient identified, Emergency Drugs available, Suction available and Patient being monitored Patient Re-evaluated:Patient Re-evaluated prior to induction Oxygen Delivery Method: Circle System Utilized Preoxygenation: Pre-oxygenation with 100% oxygen Induction Type: IV induction Ventilation: Mask ventilation without difficulty Grade View: Grade II Tube type: Oral Tube size: 7.5 mm Number of attempts: 1 Airway Equipment and Method: Stylet Placement Confirmation: ETT inserted through vocal cords under direct vision,  positive ETCO2 and breath sounds checked- equal and bilateral Secured at: 23 cm Tube secured with: Tape Dental Injury: Teeth and Oropharynx as per pre-operative assessment

## 2017-07-14 NOTE — Progress Notes (Signed)
[facility] Progress Note   Subjective:   No new complaints.   Objective Vitals:   07/14/17 1056 07/14/17 1100 07/14/17 1130 07/14/17 1200  BP: (!) 108/51 121/69 (!) 97/40 (!) 86/39  Pulse: 71 70 67 66  Resp: 15 16 16 16   Temp:      TempSrc:      SpO2:      Weight:      Height:       Physical Exam General:NAD, obese female, laying in bed  Heart: RRR no m/r/g Lungs:CTAB, no wheeze, rales or rhonchi Abdomen:soft, NTND Extremities: L knee wrapped, bandage medial thigh; RLE no edema Dialysis Access: L thigh AVG w needles in place  [facility] Weights   07/13/17 0409 07/14/17 0500 07/14/17 1044  Weight: 97.3 kg (214 lb 8.1 oz) 98.6 kg (217 lb 6 oz) 96.9 kg (213 lb 10 oz)    Intake/Output Summary (Last 24 hours) at 07/14/2017 1616 Last data filed at 07/14/2017 0900 Gross per 24 hour  Intake 2287.61 ml  Output 51 ml  Net 2236.61 ml    Additional Objective Labs: Basic Metabolic Panel: Recent Labs  Lab 07/11/17 2227 07/12/17 0523 07/14/17 0447  NA 135 137 136  K 4.0 4.2 3.7  CL 98* 99* 96*  CO2 22 25 25   GLUCOSE 239* 86 124*  BUN 39* 41* 31*  CREATININE 6.94* 7.74* 5.72*  CALCIUM 8.1* 8.0* 7.8*   CBC: Recent Labs  Lab 07/11/17 2227 07/12/17 0523 07/14/17 0447  WBC 13.7* 11.7* 7.5  NEUTROABS 10.7*  --   --   HGB 11.0* 10.4* 10.3*  HCT 34.4* 32.8* 31.2*  MCV 82.9 81.6 81.7  PLT 154 118* 146*   Blood Culture    Component Value Date/Time   SDES ABSCESS LEFT UPPER THIGH 07/14/2017 0811   SPECREQUEST SWAB SPEC A 07/14/2017 0811   CULT PENDING 07/14/2017 0811   REPTSTATUS PENDING 07/14/2017 0811    CBG: Recent Labs  Lab 07/13/17 0746 07/13/17 1204 07/13/17 1642 07/13/17 2113 07/14/17 0833  GLUCAP 170* 92 200* 128* 126*    Lab Results  Component Value Date   INR 1.72 07/14/2017   INR 2.02 07/13/2017   INR 2.58 07/12/2017   Studies/Results: No results found.  Medications: . Vancomycin    . heparin    . piperacillin-tazobactam  (ZOSYN)  IV Stopped (07/14/17 0456)  . vancomycin 750 mg (07/14/17 1512)   . [START ON 07/15/2017] carvedilol  3.125 mg Oral BID  . cinacalcet  30 mg Oral Q supper  . collagenase   Topical BID  . docusate sodium  100 mg Oral BID  . doxercalciferol  6 mcg Intravenous Q M,W,F-HD  . famotidine  20 mg Oral Daily  . feeding supplement (PRO-STAT SUGAR FREE 64)  30 mL Oral BID  . fentaNYL      . gabapentin  300 mg Oral QHS  . insulin aspart  0-9 Units Subcutaneous TID WC  . insulin glargine  3 Units Subcutaneous QHS  . midodrine  10 mg Oral Q M,W,F-HD  . multivitamin  1 tablet Oral QHS  . pantoprazole  40 mg Oral BID AC  . PARoxetine  10 mg Oral Daily  . polyethylene glycol  17 g Oral Daily  . sevelamer carbonate  800 mg Oral TID WC  . simvastatin  40 mg Oral q1800  . sucralfate  1 g Oral TID WC & HS  . thiamine  100 mg Oral Daily  . warfarin  7.5 mg Oral  ONCE-1800  . Warfarin - Pharmacist Dosing Inpatient   Does not apply q1800    Dialysis Orders: MWF - [facility] 4h   98.5kg  3k?2.25 bath  L thigh AVG   Hep 3000  Hectorol 70mcg IV qHD TIW Venofer 100mg  IV qHD mircera 175mcg IV q2wks - no longer ordered, last 1/11   Assessment: 1. L thigh cellulitis/ abscess - sp abscess drainage today 1/25 Dr Scot Dock 2. ESRD - HD MWF, using thigh AVG w/o difficulty 3.  Hypertension/volume  - BP soft, under EDW, titrate down as tol 4.  Anemia  - Hgb 10.4. No ESA indicated at this time. Holding iron due to infection.  5.  Secondary Hyperparathyroidism -  Ca in goal. No new labs. Continue binders, VDRA, and sensipar. 6.  Nutrition - Renal diet with fluid restrictions. Renavite.  7. H/o DVT - coumadin held for surg 8. DMT2 9. GERD  P - HD MWF, IV abx  [name] MD [facility] pager [phone]   07/14/2017, 4:16 PM

## 2017-07-15 ENCOUNTER — Encounter (HOSPITAL_COMMUNITY): Payer: Self-pay | Admitting: Vascular Surgery

## 2017-07-15 LAB — GLUCOSE, CAPILLARY
Glucose-Capillary: 204 mg/dL — ABNORMAL HIGH (ref 65–99)
Glucose-Capillary: 124 mg/dL — ABNORMAL HIGH (ref 65–99)
Glucose-Capillary: 149 mg/dL — ABNORMAL HIGH (ref 65–99)
Glucose-Capillary: 162 mg/dL — ABNORMAL HIGH (ref 65–99)
Glucose-Capillary: 134 mg/dL — ABNORMAL HIGH (ref 65–99)

## 2017-07-15 LAB — CBC
HCT: 31.3 % — ABNORMAL LOW (ref 36.0–46.0)
Hemoglobin: 9.9 g/dL — ABNORMAL LOW (ref 12.0–15.0)
MCH: 26 pg (ref 26.0–34.0)
MCHC: 31.6 g/dL (ref 30.0–36.0)
MCV: 82.2 fL (ref 78.0–100.0)
Platelets: 149 10*3/uL — ABNORMAL LOW (ref 150–400)
RBC: 3.81 MIL/uL — ABNORMAL LOW (ref 3.87–5.11)
RDW: 17.7 % — ABNORMAL HIGH (ref 11.5–15.5)
WBC: 6.9 10*3/uL (ref 4.0–10.5)

## 2017-07-15 LAB — HEPARIN LEVEL (UNFRACTIONATED): Heparin Unfractionated: 0.41 IU/mL (ref 0.30–0.70)

## 2017-07-15 LAB — PROTIME-INR
INR: 1.6
Prothrombin Time: 18.9 seconds — ABNORMAL HIGH (ref 11.4–15.2)

## 2017-07-15 MED ORDER — WARFARIN SODIUM 7.5 MG PO TABS
7.5000 mg | ORAL_TABLET | Freq: Once | ORAL | Status: AC
  Administered 2017-07-15: 7.5 mg via ORAL
  Filled 2017-07-15: qty 1

## 2017-07-15 MED ORDER — PHENOL 1.4 % MT LIQD
1.0000 | OROMUCOSAL | Status: DC | PRN
  Administered 2017-07-16: 1 via OROMUCOSAL
  Filled 2017-07-15: qty 177

## 2017-07-15 NOTE — Progress Notes (Signed)
[facility] Progress Note   Subjective:   No new complaints.   Objective Vitals:   07/14/17 1757 07/14/17 2124 07/15/17 0551 07/15/17 0634  BP: (!) 127/101 (!) 112/28 (!) 110/32   Pulse:  72 65   Resp: 18 16 15    Temp: 97.9 F (36.6 C) 98.3 F (36.8 C) 98 F (36.7 C)   TempSrc: Oral Oral Oral   SpO2:  99% 97%   Weight:    96.1 kg (211 lb 13.8 oz)  Height:       Physical Exam General:NAD, obese female, laying in bed  Heart: RRR no m/r/g Lungs:CTAB, no wheeze, rales or rhonchi Abdomen:soft, NTND Extremities: L knee wrapped, bandage medial thigh; RLE no edema Dialysis Access: L thigh AVG w needles in place  [facility] Weights   07/14/17 1044 07/14/17 1535 07/15/17 0634  Weight: 96.9 kg (213 lb 10 oz) 94.9 kg (209 lb 3.5 oz) 96.1 kg (211 lb 13.8 oz)    Intake/Output Summary (Last 24 hours) at 07/15/2017 1309 Last data filed at 07/15/2017 0600 Gross per 24 hour  Intake 290 ml  Output 2000 ml  Net -1710 ml    Additional Objective Labs: Basic Metabolic Panel: Recent Labs  Lab 07/12/17 0523 07/14/17 0447 07/14/17 1740  NA 137 136 135  K 4.2 3.7 3.3*  CL 99* 96* 96*  CO2 25 25 28   GLUCOSE 86 124* 187*  BUN 41* 31* 7  CREATININE 7.74* 5.72* 2.71*  CALCIUM 8.0* 7.8* 7.4*  PHOS  --   --  2.1*   CBC: Recent Labs  Lab 07/11/17 2227 07/12/17 0523 07/14/17 0447 07/14/17 1740 07/15/17 0404  WBC 13.7* 11.7* 7.5 7.1 6.9  NEUTROABS 10.7*  --   --   --   --   HGB 11.0* 10.4* 10.3* 10.7* 9.9*  HCT 34.4* 32.8* 31.2* 33.9* 31.3*  MCV 82.9 81.6 81.7 81.9 82.2  PLT 154 118* 146* 175 149*   Blood Culture    Component Value Date/Time   SDES ABSCESS LEFT UPPER THIGH 07/14/2017 0811   SPECREQUEST SWAB SPEC A 07/14/2017 0811   CULT CULTURE REINCUBATED FOR BETTER GROWTH 07/14/2017 0811   REPTSTATUS PENDING 07/14/2017 0811    CBG: Recent Labs  Lab 07/14/17 0833 07/14/17 1723 07/14/17 2121 07/15/17 0815 07/15/17 1146  GLUCAP 126* 106* 204* 124* 149*     Lab Results  Component Value Date   INR 1.60 07/15/2017   INR 1.72 07/14/2017   INR 2.02 07/13/2017   Studies/Results: No results found.  Medications: . sodium chloride    . sodium chloride    . heparin 1,350 Units/hr (07/14/17 1724)  . piperacillin-tazobactam (ZOSYN)  IV 3.375 g (07/15/17 0909)  . vancomycin 750 mg (07/14/17 1512)   . carvedilol  3.125 mg Oral BID  . cinacalcet  30 mg Oral Q supper  . collagenase   Topical BID  . docusate sodium  100 mg Oral BID  . doxercalciferol  6 mcg Intravenous Q M,W,F-HD  . famotidine  20 mg Oral Daily  . feeding supplement (PRO-STAT SUGAR FREE 64)  30 mL Oral BID  . gabapentin  300 mg Oral QHS  . insulin aspart  0-9 Units Subcutaneous TID WC  . insulin glargine  3 Units Subcutaneous QHS  . midodrine  10 mg Oral Q M,W,F-HD  . multivitamin  1 tablet Oral QHS  . pantoprazole  40 mg Oral BID AC  . PARoxetine  10 mg Oral Daily  . polyethylene glycol  17  g Oral Daily  . sevelamer carbonate  800 mg Oral TID WC  . simvastatin  40 mg Oral q1800  . sucralfate  1 g Oral TID WC & HS  . thiamine  100 mg Oral Daily  . warfarin  7.5 mg Oral ONCE-1800  . Warfarin - Pharmacist Dosing Inpatient   Does not apply q1800    Dialysis Orders: MWF - [facility] 4h   98.5kg  3k?2.25 bath  L thigh AVG   Hep 3000  Hectorol 98mcg IV qHD TIW Venofer 100mg  IV qHD mircera 174mcg IV q2wks - no longer ordered, last 1/11   Assessment: 1. L thigh cellulitis/ abscess - sp abscess drainage 1/25 Dr Scot Dock 2. ESRD - HD MWF, using thigh AVG w/o difficulty 3.  Hypertension/volume  - BP soft, under EDW, titrate down as tol, takes midodrine 10 tid and coreg 6.25 mg bid at home 4.  Anemia  - Hgb 10.4. No ESA indicated at this time. Holding iron due to infection.  5.  Secondary Hyperparathyroidism -  Ca in goal. No new labs. Continue binders, VDRA, and sensipar. 6.  Nutrition - Renal diet with fluid restrictions. Renavite.  7. H/o DVT - coumadin held for  surg 8. DMT2 9. GERD  P - HD MWF, IV abx  [name] MD [facility] pager [phone]   07/15/2017, 1:09 PM

## 2017-07-15 NOTE — Progress Notes (Signed)
[facility]  Subjective  - feels ok   Objective (!) 110/32 65 98 F (36.7 C) (Oral) 15 97%  Intake/Output Summary (Last 24 hours) at 07/15/2017 1103 Last data filed at 07/15/2017 0600 Gross per 24 hour  Intake 290 ml  Output 2000 ml  Net -1710 ml   Left thigh wound clean  I and D site below this clean no purulent drainage Left thigh graft no erythema  Assessment/Planning: S/p I and D left thigh abscess.  Continue local wound care. Dr Trula Slade to see on Monday  Ruta Hinds 07/15/2017 11:03 AM --  Laboratory Lab Results: Recent Labs    07/14/17 1740 07/15/17 0404  WBC 7.1 6.9  HGB 10.7* 9.9*  HCT 33.9* 31.3*  PLT 175 149*   BMET Recent Labs    07/14/17 0447 07/14/17 1740  NA 136 135  K 3.7 3.3*  CL 96* 96*  CO2 25 28  GLUCOSE 124* 187*  BUN 31* 7  CREATININE 5.72* 2.71*  CALCIUM 7.8* 7.4*    COAG Lab Results  Component Value Date   INR 1.60 07/15/2017   INR 1.72 07/14/2017   INR 2.02 07/13/2017   No results found for: PTT

## 2017-07-15 NOTE — Progress Notes (Signed)
[facility] PROGRESS NOTE  Ashlee Miles TZG:[id number] DOB: [birth year] DOA: 07/11/2017  PCP: Darlina Rumpf, MD  Brief History/Interval Summary: 77 year old female with a past medical history of end-stage renal disease on hemodialysis, hypertension, hyperlipidemia, protein C deficiency, history of pituitary adenoma, history of DVT on warfarin, type 2 diabetes, wound VAC in the left lower extremity presented with increasing pain and erythema of the left upper leg.  Patient recently underwent left thigh graft thrombectomy and had a wound with wound VAC.  Patient was hospitalized for further management.  CT scan shows fluid collection in the left lower extremity.  Patient was taken to the OR on 1/25 for drainage.  Reason for Visit: Cellulitis and abscess, left leg  Consultants: Vascular surgery.  Nephrology.  Procedures:  1.  Incision and drainage of abscess left thigh 2.  Excisional debridement of wound left thigh (subcutaneous tissue)  Antibiotics: Vancomycin and Zosyn  Subjective/Interval History: Patient complaining of pain in her left leg after a recent dressing change.  Denies any chest pain shortness of breath.    ROS: Denies any nausea or vomiting  Objective:  Vital Signs  Vitals:   07/14/17 1757 07/14/17 2124 07/15/17 0551 07/15/17 0634  BP: (!) 127/101 (!) 112/28 (!) 110/32   Pulse:  72 65   Resp: 18 16 15    Temp: 97.9 F (36.6 C) 98.3 F (36.8 C) 98 F (36.7 C)   TempSrc: Oral Oral Oral   SpO2:  99% 97%   Weight:    96.1 kg (211 lb 13.8 oz)  Height:        Intake/Output Summary (Last 24 hours) at 07/15/2017 0947 Last data filed at 07/15/2017 0600 Gross per 24 hour  Intake 290 ml  Output 2000 ml  Net -1710 ml   Filed Weights   07/14/17 1044 07/14/17 1535 07/15/17 0634  Weight: 96.9 kg (213 lb 10 oz) 94.9 kg (209 lb 3.5 oz) 96.1 kg (211 lb 13.8 oz)    General appearance: Awake alert.  In no distress. Resp: Clear to auscultation  bilaterally. Cardio: S1-S2 is normal regular.  No S3-S4.  No rubs murmurs or bruit GI: Abdomen remains soft.  Nontender nondistended.  Bowel sounds are present.  No masses organomegaly Extremities: Left lower extremity covered in dressing. Neurologic: No obvious focal neurological deficits.  Lab Results:  Data Reviewed: I have personally reviewed following labs and imaging studies  CBC: Recent Labs  Lab 07/11/17 2227 07/12/17 0523 07/14/17 0447 07/14/17 1740 07/15/17 0404  WBC 13.7* 11.7* 7.5 7.1 6.9  NEUTROABS 10.7*  --   --   --   --   HGB 11.0* 10.4* 10.3* 10.7* 9.9*  HCT 34.4* 32.8* 31.2* 33.9* 31.3*  MCV 82.9 81.6 81.7 81.9 82.2  PLT 154 118* 146* 175 149*    Basic Metabolic Panel: Recent Labs  Lab 07/11/17 2227 07/12/17 0523 07/14/17 0447 07/14/17 1740  NA 135 137 136 135  K 4.0 4.2 3.7 3.3*  CL 98* 99* 96* 96*  CO2 22 25 25 28   GLUCOSE 239* 86 124* 187*  BUN 39* 41* 31* 7  CREATININE 6.94* 7.74* 5.72* 2.71*  CALCIUM 8.1* 8.0* 7.8* 7.4*  PHOS  --   --   --  2.1*    GFR: Estimated Creatinine Clearance: 19.1 mL/min (A) (by C-G formula based on SCr of 2.71 mg/dL (H)).  Coagulation Profile: Recent Labs  Lab 07/12/17 0127 07/13/17 0441 07/14/17 0447 07/15/17 0404  INR 2.58 2.02 1.72 1.60   CBG: Recent  Labs  Lab 07/13/17 2113 07/14/17 0833 07/14/17 1723 07/14/17 2121 07/15/17 0815  GLUCAP 128* 126* 106* 204* 124*    Radiology Studies: No results found.   Medications:  Scheduled: . carvedilol  3.125 mg Oral BID  . cinacalcet  30 mg Oral Q supper  . collagenase   Topical BID  . docusate sodium  100 mg Oral BID  . doxercalciferol  6 mcg Intravenous Q M,W,F-HD  . famotidine  20 mg Oral Daily  . feeding supplement (PRO-STAT SUGAR FREE 64)  30 mL Oral BID  . gabapentin  300 mg Oral QHS  . insulin aspart  0-9 Units Subcutaneous TID WC  . insulin glargine  3 Units Subcutaneous QHS  . midodrine  10 mg Oral Q M,W,F-HD  . multivitamin  1 tablet  Oral QHS  . pantoprazole  40 mg Oral BID AC  . PARoxetine  10 mg Oral Daily  . polyethylene glycol  17 g Oral Daily  . sevelamer carbonate  800 mg Oral TID WC  . simvastatin  40 mg Oral q1800  . sucralfate  1 g Oral TID WC & HS  . thiamine  100 mg Oral Daily  . Warfarin - Pharmacist Dosing Inpatient   Does not apply q1800   Continuous: . sodium chloride    . sodium chloride    . heparin 1,350 Units/hr (07/14/17 1724)  . piperacillin-tazobactam (ZOSYN)  IV 3.375 g (07/15/17 0909)  . vancomycin 750 mg (07/14/17 1512)   ZGY:FVCBSW chloride, sodium chloride, acetaminophen **OR** acetaminophen, alteplase, heparin, heparin, hydrALAZINE, ipratropium-albuterol, lidocaine (PF), lidocaine-prilocaine, linaclotide, ondansetron **OR** ondansetron (ZOFRAN) IV, oxyCODONE-acetaminophen, pentafluoroprop-tetrafluoroeth, phenol, senna-docusate, sodium chloride flush, triamcinolone cream, zolpidem  Assessment/Plan:  Principal Problem:   Cellulitis of left lower extremity Active Problems:   Essential hypertension   DVT (deep venous thrombosis) (HCC)   ESRD on dialysis ([place])   Anemia due to chronic kidney disease   Diabetes mellitus with end stage renal disease (HCC)   GERD (gastroesophageal reflux disease)   Protein C deficiency (HCC)   Leg wound, left   HLD (hyperlipidemia)    Cellulitis and possible abscess of the left lower extremity in the setting of chronic wound and recent surgery Patient underwent vascular surgery recently to that area.  She had a wound VAC in place.  CT scan raised concern for fluid collection.  Vascular surgery was consulted.  Patient was placed on vancomycin and Zosyn.  Patient taken to the operating room on 1/25 for surgical drainage.  Seems to be stable postoperatively.  Lactic acid level was normal.  Pro-calcitonin 8.26.  WBC was noted to be elevated and has been normal subsequently.  Change antibiotics based on culture data when available.  Blood cultures negative so  far.  Await culture of fluid sent after surgical drainage. MRSA PCR has been negative previously.  History of DVT and protein C deficiency She was on warfarin which was reversed for surgery.  Patient was placed on IV heparin.  Warfarin has been resumed.  Pharmacy is managing.  History of essential hypertension Blood pressure was noted to be low at hemodialysis.  Appears to have stabilized.  Dose of carvedilol was reduced.  Continue to monitor.  She is also noted to be on midodrine.    End-stage renal disease on hemodialysis (MWF) Nephrology is following.  She is being dialyzed as per her usual schedule.  Anemia chronic kidney disease Hemoglobin stable.  No evidence of overt bleeding.  Continue to monitor.  History of diabetes mellitus  type 2 in the setting of end-stage renal disease Monitor CBGs.  Lantus is being continued.  CBGs are reasonably well controlled.  History of GERD Continue home medications.  DVT Prophylaxis: IV heparin and warfarin Code Status: DNR Family Communication: Discussed with the patient Disposition Plan: Management as outlined above.  Await body fluid culture data.  Vascular surgery is following.    LOS: 3 days   [facility] Pager [phone] 07/15/2017, 9:47 AM  If 7PM-7AM, please contact night-coverage at [email or web address], password [facility]

## 2017-07-15 NOTE — Progress Notes (Signed)
ANTICOAGULATION CONSULT NOTE - Follow Up Consult  Pharmacy Consult for Heparin and Coumadin Indication: history of DVT, protein C deficiency  No Active Allergies  Patient Measurements: Height: 5\' 2"  (157.5 cm) Weight: 211 lb 13.8 oz (96.1 kg) IBW/kg (Calculated) : 50.1 Heparin Dosing Weight: 73 kg  Vital Signs: Temp: 98 F (36.7 C) (01/26 0551) Temp Source: Oral (01/26 0551) BP: 110/32 (01/26 0551) Pulse Rate: 65 (01/26 0551)  Labs: Recent Labs    07/13/17 0441 07/13/17 2050  07/14/17 0447 07/14/17 1740 07/15/17 0404  HGB  --   --    < > 10.3* 10.7* 9.9*  HCT  --   --   --  31.2* 33.9* 31.3*  PLT  --   --   --  146* 175 149*  LABPROT 22.7*  --   --  20.0*  --  18.9*  INR 2.02  --   --  1.72  --  1.60  HEPARINUNFRC  --  0.20*  --  0.50  --  0.41  CREATININE  --   --   --  5.72* 2.71*  --    < > = values in this interval not displayed.    Estimated Creatinine Clearance: 19.1 mL/min (A) (by C-G formula based on SCr of 2.71 mg/dL (H)).   Medications:  Infusions:  . sodium chloride    . sodium chloride    . heparin 1,350 Units/hr (07/14/17 1724)  . piperacillin-tazobactam (ZOSYN)  IV 3.375 g (07/15/17 0909)  . vancomycin 750 mg (07/14/17 1512)    Assessment: 77 yo F on Coumadin PTA for hx of DVT and protein C deficiency.  Pt is admitted with LLE cellulitis.  CT scan noted fluid collection on LLE concerning for abscess / infection with plans for drainage.  Coumadin reversed for procedure and IV heparin initiated. The patient is s/p I&D by [facility] on 1/25 with heparin and warfarin resumed post-op.    Heparin level this morning is therapeutic (HL 0.41, goal of 0.3-0.7). INR remains SUBtherapeutic (INR 1.6 << 1.72, goal of 2-3). Hgb/Hct slight drop, plts 149 - will monitor. No bleeding noted at this time.   Goal of Therapy:  Heparin level 0.3-0.7 units/ml Monitor platelets by anticoagulation protocol: Yes   Plan:  - Continue Heparin at 1350 units/hr (13.5 ml/hr) -  Warfarin 7.5 mg x 1 at 1800 today - Will continue to monitor for any signs/symptoms of bleeding and will follow up with HL and PT/INR in the a.m.   Thank you for allowing pharmacy to be a part of this patient's care.  Alycia Rossetti, PharmD, BCPS Clinical Pharmacist Pager: [phone] Clinical phone for 07/15/2017 from 7a-3:30p: [phone] If after 3:30p, please call main pharmacy at: [phone] 07/15/2017 11:04 AM

## 2017-07-15 NOTE — Progress Notes (Signed)
Pharmacy Antibiotic Note  Ashlee Miles is a 77 y.o. female admitted on 07/11/2017 with LLE cellulitis - now s/p I&D on 1/25. Pharmacy has been consulted for Vancomycin dosing.  The patient is ESRD-MWF and was loaded appropriate and has received maintenance doses of Vancomycin after HD. Currently appears to be on schedule with the next HD planned for 1/28. New cultures noted to be taken in the OR on 1/25 - will f/u on these.   Plan: 1. Continue Vancomycin 750 mg/HD-MWF 2. Continue Zosyn 3.375g IV every 12 hours (infused over 4 hours) 3. Will continue to follow HD schedule/duration, culture results, LOT, and antibiotic de-escalation plans   Height: 5\' 2"  (157.5 cm) Weight: 211 lb 13.8 oz (96.1 kg) IBW/kg (Calculated) : 50.1  Temp (24hrs), Avg:98.2 F (36.8 C), Min:97.9 F (36.6 C), Max:98.4 F (36.9 C)  Recent Labs  Lab 07/11/17 2227 07/12/17 0145 07/12/17 0523 07/14/17 0447 07/14/17 1740 07/15/17 0404  WBC 13.7*  --  11.7* 7.5 7.1 6.9  CREATININE 6.94*  --  7.74* 5.72* 2.71*  --   LATICACIDVEN  --  1.1 0.8  --   --   --     Estimated Creatinine Clearance: 19.1 mL/min (A) (by C-G formula based on SCr of 2.71 mg/dL (H)).    No Active Allergies  Antimicrobials this admission: Vanc 1/23 >> * Doses: 1500 (1/23 @ 0140), 750 mg (1/24 @ 1000, 1/25 @ 1500) * HD sessions: 1/23-1/24 (4 hr BFR 400, end 0030), 1/25 (4 hr BFR 400, end 1530) Zosyn 1/23 >>  Dose adjustments this admission: n/a  Microbiology results: 1/23 BCx >> ngtd 1/24 MRSA PCR negative 1/25 L thigh abscess (from OR) >>  Thank you for allowing pharmacy to be a part of this patient's care.  Alycia Rossetti, PharmD, BCPS Clinical Pharmacist Pager: [phone] Clinical phone for 07/15/2017 from 7a-3:30p: [phone] If after 3:30p, please call main pharmacy at: [phone] 07/15/2017 11:08 AM

## 2017-07-16 LAB — GLUCOSE, CAPILLARY
Glucose-Capillary: 113 mg/dL — ABNORMAL HIGH (ref 65–99)
Glucose-Capillary: 184 mg/dL — ABNORMAL HIGH (ref 65–99)
Glucose-Capillary: 150 mg/dL — ABNORMAL HIGH (ref 65–99)
Glucose-Capillary: 102 mg/dL — ABNORMAL HIGH (ref 65–99)

## 2017-07-16 LAB — CBC
HCT: 31.1 % — ABNORMAL LOW (ref 36.0–46.0)
Hemoglobin: 9.8 g/dL — ABNORMAL LOW (ref 12.0–15.0)
MCH: 25.7 pg — ABNORMAL LOW (ref 26.0–34.0)
MCHC: 31.5 g/dL (ref 30.0–36.0)
MCV: 81.6 fL (ref 78.0–100.0)
Platelets: 151 10*3/uL (ref 150–400)
RBC: 3.81 MIL/uL — ABNORMAL LOW (ref 3.87–5.11)
RDW: 17.5 % — ABNORMAL HIGH (ref 11.5–15.5)
WBC: 6.1 10*3/uL (ref 4.0–10.5)

## 2017-07-16 LAB — HEPARIN LEVEL (UNFRACTIONATED)
Heparin Unfractionated: <0.1 IU/mL — ABNORMAL LOW (ref 0.30–0.70)
Heparin Unfractionated: 0.61 IU/mL (ref 0.30–0.70)

## 2017-07-16 LAB — PROTIME-INR
INR: 1.56
Prothrombin Time: 18.5 seconds — ABNORMAL HIGH (ref 11.4–15.2)

## 2017-07-16 MED ORDER — OXYCODONE-ACETAMINOPHEN 5-325 MG PO TABS
1.0000 | ORAL_TABLET | ORAL | Status: DC | PRN
  Administered 2017-07-16 – 2017-07-21 (×13): 2 via ORAL
  Filled 2017-07-16 (×2): qty 2
  Filled 2017-07-16: qty 1
  Filled 2017-07-16: qty 2
  Filled 2017-07-16: qty 1
  Filled 2017-07-16 (×7): qty 2
  Filled 2017-07-16: qty 1
  Filled 2017-07-16 (×2): qty 2

## 2017-07-16 MED ORDER — WARFARIN SODIUM 7.5 MG PO TABS
7.5000 mg | ORAL_TABLET | Freq: Once | ORAL | Status: AC
  Administered 2017-07-16: 7.5 mg via ORAL
  Filled 2017-07-16: qty 1

## 2017-07-16 MED ORDER — DARBEPOETIN ALFA 60 MCG/0.3ML IJ SOSY
60.0000 ug | PREFILLED_SYRINGE | INTRAMUSCULAR | Status: DC
  Administered 2017-07-17: 60 ug via INTRAVENOUS
  Filled 2017-07-16: qty 0.3

## 2017-07-16 MED ORDER — SODIUM CHLORIDE 0.9 % IV SOLN
3.0000 g | INTRAVENOUS | Status: DC
  Administered 2017-07-16 – 2017-07-20 (×5): 3 g via INTRAVENOUS
  Filled 2017-07-16 (×6): qty 3

## 2017-07-16 NOTE — Progress Notes (Signed)
Pharmacy Antibiotic Note  Ashlee Miles is a 77 y.o. female admitted on 07/11/2017 with LLE cellulitis - now s/p I&D on 1/25 with intra-op cultures growing pan-sensitive Enterococcus faecalis. Pharmacy has been consulted to narrow antibiotics to Unasyn.   The patient is ESRD-MWF. Next planned for 1/28  Plan: 1. Start Unasyn 3g IV every 24 hours 2. Will continue to follow HD schedule/duration, culture results, LOT, and antibiotic de-escalation plans   Height: 5\' 2"  (157.5 cm) Weight: 215 lb 9.8 oz (97.8 kg) IBW/kg (Calculated) : 50.1  Temp (24hrs), Avg:97.9 F (36.6 C), Min:97.6 F (36.4 C), Max:98.1 F (36.7 C)  Recent Labs  Lab 07/11/17 2227 07/12/17 0145 07/12/17 0523 07/14/17 0447 07/14/17 1740 07/15/17 0404 07/16/17 0406  WBC 13.7*  --  11.7* 7.5 7.1 6.9 6.1  CREATININE 6.94*  --  7.74* 5.72* 2.71*  --   --   LATICACIDVEN  --  1.1 0.8  --   --   --   --     Estimated Creatinine Clearance: 19.3 mL/min (A) (by C-G formula based on SCr of 2.71 mg/dL (H)).    No Active Allergies  Antimicrobials this admission: Vanc 1/23 >> 1/27 Zosyn 1/23 >> 1/27 Unasyn 1/27 >>  Dose adjustments this admission: n/a  Microbiology results: 1/23 BCx >> ngtd 1/24 MRSA PCR negative 1/25 L thigh abscess (from OR) >> Enterococcus faecalis (pan-S)  Thank you for allowing pharmacy to be a part of this patient's care.  Alycia Rossetti, PharmD, BCPS Clinical Pharmacist Pager: [phone] Clinical phone for 07/16/2017 from 7a-3:30p: [phone] If after 3:30p, please call main pharmacy at: [phone] 07/16/2017 12:50 PM

## 2017-07-16 NOTE — Progress Notes (Signed)
[facility] PROGRESS NOTE  Ashlee Miles EGB:[id number] DOB: 27-Mar-1941 DOA: 07/11/2017  PCP: Darlina Rumpf, MD  Brief History/Interval Summary: 77 year old female with a past medical history of end-stage renal disease on hemodialysis, hypertension, hyperlipidemia, protein C deficiency, history of pituitary adenoma, history of DVT on warfarin, type 2 diabetes, wound VAC in the left lower extremity presented with increasing pain and erythema of the left upper leg.  Patient recently underwent left thigh graft thrombectomy and had a wound with wound VAC.  Patient was hospitalized for further management.  CT scan shows fluid collection in the left lower extremity.  Patient was taken to the OR on 1/25 for drainage.  Reason for Visit: Cellulitis and abscess, left leg  Consultants: Vascular surgery.  Nephrology.  Procedures:  1.  Incision and drainage of abscess left thigh 2.  Excisional debridement of wound left thigh (subcutaneous tissue)  Antibiotics: Vancomycin and Zosyn Changed to Unasyn  Subjective/Interval History: Patient continues to have pain in her left lower extremity.  At worst it is 9-10 out of 10 in intensity.  With pain medication it does drop down to 6-7 out of 10 in intensity.  Denies any shortness of breath or chest pain.    ROS: Denies any nausea vomiting  Objective:  Vital Signs  Vitals:   07/15/17 2133 07/15/17 2133 07/16/17 0500 07/16/17 0615  BP: (!) 129/50 (!) 129/50  (!) 117/35  Pulse: 69 69  62  Resp: 20 20  18   Temp: 98.1 F (36.7 C) 98.1 F (36.7 C)  97.6 F (36.4 C)  TempSrc: Oral     SpO2: 99% 99%  98%  Weight:   97.8 kg (215 lb 9.8 oz)   Height:        Intake/Output Summary (Last 24 hours) at 07/16/2017 1215 Last data filed at 07/16/2017 0400 Gross per 24 hour  Intake 220 ml  Output 150 ml  Net 70 ml   Filed Weights   07/14/17 1535 07/15/17 0634 07/16/17 0500  Weight: 94.9 kg (209 lb 3.5 oz) 96.1 kg (211 lb 13.8 oz) 97.8 kg (215 lb  9.8 oz)    General appearance: Awake alert.  In no distress Resp: Clear to auscultation bilaterally.  No wheezing rales or rhonchi Cardio: S1-S2 is normal regular.  No S3-S4.  No rubs murmurs or bruit GI: Abdomen remains soft.  Nontender nondistended.  Bowel sounds are present.  No masses organomegaly  Extremities: Left lower extremity covered in dressing. Neurologic: No obvious focal neurological deficits.  Lab Results:  Data Reviewed: I have personally reviewed following labs and imaging studies  CBC: Recent Labs  Lab 07/11/17 2227 07/12/17 0523 07/14/17 0447 07/14/17 1740 07/15/17 0404 07/16/17 0406  WBC 13.7* 11.7* 7.5 7.1 6.9 6.1  NEUTROABS 10.7*  --   --   --   --   --   HGB 11.0* 10.4* 10.3* 10.7* 9.9* 9.8*  HCT 34.4* 32.8* 31.2* 33.9* 31.3* 31.1*  MCV 82.9 81.6 81.7 81.9 82.2 81.6  PLT 154 118* 146* 175 149* 371    Basic Metabolic Panel: Recent Labs  Lab 07/11/17 2227 07/12/17 0523 07/14/17 0447 07/14/17 1740  NA 135 137 136 135  K 4.0 4.2 3.7 3.3*  CL 98* 99* 96* 96*  CO2 22 25 25 28   GLUCOSE 239* 86 124* 187*  BUN 39* 41* 31* 7  CREATININE 6.94* 7.74* 5.72* 2.71*  CALCIUM 8.1* 8.0* 7.8* 7.4*  PHOS  --   --   --  2.1*  GFR: Estimated Creatinine Clearance: 19.3 mL/min (A) (by C-G formula based on SCr of 2.71 mg/dL (H)).  Coagulation Profile: Recent Labs  Lab 07/12/17 0127 07/13/17 0441 07/14/17 0447 07/15/17 0404 07/16/17 0406  INR 2.58 2.02 1.72 1.60 1.56   CBG: Recent Labs  Lab 07/15/17 0815 07/15/17 1146 07/15/17 1731 07/15/17 2146 07/16/17 0817  GLUCAP 124* 149* 162* 134* 113*    Radiology Studies: No results found.   Medications:  Scheduled: . carvedilol  3.125 mg Oral BID  . cinacalcet  30 mg Oral Q supper  . collagenase   Topical BID  . [START ON 07/17/2017] darbepoetin (ARANESP) injection - DIALYSIS  60 mcg Intravenous Q Mon-HD  . docusate sodium  100 mg Oral BID  . doxercalciferol  6 mcg Intravenous Q M,W,F-HD  .  famotidine  20 mg Oral Daily  . feeding supplement (PRO-STAT SUGAR FREE 64)  30 mL Oral BID  . gabapentin  300 mg Oral QHS  . insulin aspart  0-9 Units Subcutaneous TID WC  . insulin glargine  3 Units Subcutaneous QHS  . midodrine  10 mg Oral Q M,W,F-HD  . multivitamin  1 tablet Oral QHS  . pantoprazole  40 mg Oral BID AC  . PARoxetine  10 mg Oral Daily  . polyethylene glycol  17 g Oral Daily  . simvastatin  40 mg Oral q1800  . sucralfate  1 g Oral TID WC & HS  . thiamine  100 mg Oral Daily  . Warfarin - Pharmacist Dosing Inpatient   Does not apply q1800   Continuous: . sodium chloride    . sodium chloride    . heparin 1,350 Units/hr (07/16/17 0520)  . piperacillin-tazobactam (ZOSYN)  IV 3.375 g (07/16/17 0855)  . vancomycin 750 mg (07/14/17 1512)   GYJ:EHUDJS chloride, sodium chloride, acetaminophen **OR** acetaminophen, hydrALAZINE, ipratropium-albuterol, linaclotide, ondansetron **OR** ondansetron (ZOFRAN) IV, oxyCODONE-acetaminophen, phenol, senna-docusate, sodium chloride flush, triamcinolone cream, zolpidem  Assessment/Plan:  Principal Problem:   Cellulitis of left lower extremity Active Problems:   Essential hypertension   DVT (deep venous thrombosis) (HCC)   ESRD on dialysis ([place])   Anemia due to chronic kidney disease   Diabetes mellitus with end stage renal disease (HCC)   GERD (gastroesophageal reflux disease)   Protein C deficiency (HCC)   Leg wound, left   HLD (hyperlipidemia)    Cellulitis and possible abscess of the left lower extremity in the setting of chronic wound and recent surgery Patient underwent vascular surgery recently to that area.  She had a wound VAC in place.  CT scan raised concern for fluid collection.  Vascular surgery was consulted.  Patient was placed on vancomycin and Zosyn.  Patient taken to the operating room on 1/25 for surgical drainage.  Seems to be stable postoperatively.  Lactic acid level was normal.  Pro-calcitonin 8.26.  WBC was  noted to be elevated and has been normal subsequently.  Cultures from the drain fluid positive for enterococcus.  Discussed with infectious disease.  We will change her to Unasyn for now.  Once she is thought to have improvement in her wound we will change her to oral amoxicillin.  MRSA PCR has been negative previously.  History of DVT and protein C deficiency She was on warfarin which was reversed for surgery.  Patient was placed on IV heparin.  Warfarin has been resumed.  Pharmacy is managing.  History of essential hypertension Blood pressure was noted to be low at hemodialysis.  Appears to have stabilized.  Dose of carvedilol was reduced.  Blood pressures have stabilized.  Continue to monitor.  She is also noted to be on midodrine.    End-stage renal disease on hemodialysis (MWF) Nephrology is following.  She is being dialyzed as per her usual schedule.  Anemia chronic kidney disease Hemoglobin stable.  No evidence of overt bleeding.  Continue to monitor.  History of diabetes mellitus type 2 in the setting of end-stage renal disease Monitor CBGs.  Lantus is being continued.  CBGs are reasonably well controlled.  History of GERD Continue home medications.  DVT Prophylaxis: IV heparin and warfarin Code Status: DNR Family Communication: Discussed with the patient Disposition Plan: Management as outlined above.  Change in antibiotics as discussed above.  PT evaluation.    LOS: 4 days   [facility] Pager [phone] 07/16/2017, 12:15 PM  If 7PM-7AM, please contact night-coverage at [email or web address], password [facility]

## 2017-07-16 NOTE — Progress Notes (Signed)
  Progress Note    07/16/2017 9:44 AM 2 Days Post-Op  Subjective:  Says her leg is painful  Afebrile x 24 hrs  Vitals:   07/15/17 2133 07/16/17 0615  BP: (!) 129/50 (!) 117/35  Pulse: 69 62  Resp: 20 18  Temp: 98.1 F (36.7 C) 97.6 F (36.4 C)  SpO2: 99% 98%    Physical Exam: Incisions:  Both wounds are clean without purulent drainage.     CBC    Component Value Date/Time   WBC 6.1 07/16/2017 0406   RBC 3.81 (L) 07/16/2017 0406   HGB 9.8 (L) 07/16/2017 0406   HCT 31.1 (L) 07/16/2017 0406   PLT 151 07/16/2017 0406   MCV 81.6 07/16/2017 0406   MCH 25.7 (L) 07/16/2017 0406   MCHC 31.5 07/16/2017 0406   RDW 17.5 (H) 07/16/2017 0406   LYMPHSABS 2.1 07/11/2017 2227   MONOABS 0.9 07/11/2017 2227   EOSABS 0.1 07/11/2017 2227   BASOSABS 0.0 07/11/2017 2227    BMET    Component Value Date/Time   NA 135 07/14/2017 1740   K 3.3 (L) 07/14/2017 1740   CL 96 (L) 07/14/2017 1740   CO2 28 07/14/2017 1740   GLUCOSE 187 (H) 07/14/2017 1740   BUN 7 07/14/2017 1740   CREATININE 2.71 (H) 07/14/2017 1740   CALCIUM 7.4 (L) 07/14/2017 1740   CALCIUM 7.8 (L) 03/13/2017 1559   GFRNONAA 16 (L) 07/14/2017 1740   GFRAA 19 (L) 07/14/2017 1740    INR    Component Value Date/Time   INR 1.56 07/16/2017 0406     Intake/Output Summary (Last 24 hours) at 07/16/2017 0944 Last data filed at 07/16/2017 0400 Gross per 24 hour  Intake 220 ml  Output 150 ml  Net 70 ml     Assessment:  77 y.o. female is s/p:  1.  Incision and drainage of abscess left thigh 2.  Excisional debridement of wound left thigh (subcutaneous tissue)  2 Days Post-Op  Plan: -dressings changed and wounds are clean without purulent drainage -dressing change still painful, but tolerated better today  -continue wet to dry dressing changes and abx -Dr. Trula Slade will be back tomorrow -DVT prophylaxis:  Heparin gtt   Leontine Locket, PA-C [facility] [phone] 07/16/2017 9:44 AM

## 2017-07-16 NOTE — Progress Notes (Signed)
ANTICOAGULATION CONSULT NOTE - Follow Up Consult  Pharmacy Consult for heparin Indication: h/o DVT w/ protein C deficiency  Labs: Recent Labs    07/14/17 0447 07/14/17 1740 07/15/17 0404 07/16/17 0406  HGB 10.3* 10.7* 9.9* 9.8*  HCT 31.2* 33.9* 31.3* 31.1*  PLT 146* 175 149* PENDING  LABPROT 20.0*  --  18.9* 18.5*  INR 1.72  --  1.60 1.56  HEPARINUNFRC 0.50  --  0.41 <0.10*  CREATININE 5.72* 2.71*  --   --     Assessment/Plan:  Heparin level undetectable this am but upon discussion w/ RN heparin gtt was stopped during day shift yesterday though unclear when and why.  Will resume heparin gtt and check heparin level in 8hr; f/u w/ rounding team.  Wynona Neat, PharmD, BCPS  07/16/2017,5:08 AM

## 2017-07-16 NOTE — Progress Notes (Signed)
CSW was consulted by RN concerning SNF placement.  CSW gave pt possible SNF placements. Pt will look list over and decide.    Reed Breech LCSWA [phone]

## 2017-07-16 NOTE — Progress Notes (Signed)
ANTICOAGULATION CONSULT NOTE - Follow Up Consult  Pharmacy Consult for Heparin and Coumadin Indication: history of DVT, protein C deficiency  No Active Allergies  Patient Measurements: Height: 5\' 2"  (157.5 cm) Weight: 215 lb 9.8 oz (97.8 kg) IBW/kg (Calculated) : 50.1 Heparin Dosing Weight: 73 kg  Vital Signs: Temp: 97.6 F (36.4 C) (01/27 0615) BP: 117/35 (01/27 0615) Pulse Rate: 62 (01/27 0615)  Labs: Recent Labs    07/14/17 0447 07/14/17 1740 07/15/17 0404 07/16/17 0406  HGB 10.3* 10.7* 9.9* 9.8*  HCT 31.2* 33.9* 31.3* 31.1*  PLT 146* 175 149* 151  LABPROT 20.0*  --  18.9* 18.5*  INR 1.72  --  1.60 1.56  HEPARINUNFRC 0.50  --  0.41 <0.10*  CREATININE 5.72* 2.71*  --   --     Estimated Creatinine Clearance: 19.3 mL/min (A) (by C-G formula based on SCr of 2.71 mg/dL (H)).   Medications:  Infusions:  . sodium chloride    . sodium chloride    . heparin 1,350 Units/hr (07/16/17 0520)  . piperacillin-tazobactam (ZOSYN)  IV 3.375 g (07/16/17 0855)  . vancomycin 750 mg (07/14/17 1512)    Assessment: 77 yo F on Coumadin PTA for hx of DVT and protein C deficiency.  Pt is admitted with LLE cellulitis.  CT scan noted fluid collection on LLE concerning for abscess / infection with plans for drainage.  Coumadin reversed for procedure and IV heparin initiated. The patient is s/p I&D by [facility] on 1/25 with heparin and warfarin resumed post-op.    Heparin level this morning is afternoon remains therapeutic (HL 0.61, goal of 0.3-0.7). INR remains SUBtherapeutic (INR 1.56 << 1.6, goal of 2-3). CBC low but stable - will monitor. No bleeding noted at this time.   Goal of Therapy:  Heparin level 0.3-0.7 units/ml Monitor platelets by anticoagulation protocol: Yes   Plan:  - Continue Heparin at 1350 units/hr (13.5 ml/hr) - Warfarin 7.5 mg x 1 at 1800 today - Will continue to monitor for any signs/symptoms of bleeding and will follow up with HL and PT/INR in the a.m.   Thank you  for allowing pharmacy to be a part of this patient's care.  Alycia Rossetti, PharmD, BCPS Clinical Pharmacist Pager: [phone] Clinical phone for 07/16/2017 from 7a-3:30p: [phone] If after 3:30p, please call main pharmacy at: [phone] 07/16/2017 11:01 AM

## 2017-07-16 NOTE — Progress Notes (Signed)
[facility] Progress Note  Dialysis Orders: MWF -NW 4h   98.5kg  3k?2.25 bath  L thigh AVG   Hep 3000  Hectorol 88mcg IV qHD TIW Venofer 100mg  IV qHD mircera 1109mcg IV q2wks - no longer ordered, last 1/11 hgb 12 (was on 225 prior to that)  Assessment: 1. Lthigh cellulitis/ abscess - sp abscess drainage 1/25 Dr Scot Dock- on Vanc and Zosyn wound culture + enterococcus faecalis - BC no growth 2. ESRD- HD MWF, using thigh AVG w/o difficulty - next HD Monday using 4 K bath to start Monday- if K higher change to 3K 2.5 Ca 3. Hypertension/volume- BP soft, under EDW, titrate down as tol, takes midodrine 10 tid and coreg 6.25 mg bid at home- now reduced to 3.125 bid; net UF 2 L Friday with post wt 4. Anemia- Hgb 9.8. -resume Aranesp 60 Holding iron due to infection. 5. Secondary Hyperparathyroidism -1/25 Ca 7.4. P 2.1 binders stopped 1/27- repeat labs with HD Monday , VDRA, and sensipar. 6. Nutrition- Renal diet with fluid restrictions. Renavite.  7. H/o DVT/protein C def - pharmacy titrating INR up 8. DMT2 9. GERD- on carafate - should only be used short term; also pepid 10. Left wrist fx - wearing splint - fell in November - seen by ortho a few weeks ago - needs f/u - dueto see this week   Myriam Jacobson, PA-C [place] [phone] 07/16/2017,11:33 AM  LOS: 4 days   Pt seen, examined and agree w A/P as above.  [name] MD [facility] pager [phone]   07/16/2017, 2:01 PM    Subjective:   No c/o  Objective Vitals:   07/15/17 2133 07/15/17 2133 07/16/17 0500 07/16/17 0615  BP: (!) 129/50 (!) 129/50  (!) 117/35  Pulse: 69 69  62  Resp: 20 20  18   Temp: 98.1 F (36.7 C) 98.1 F (36.7 C)  97.6 F (36.4 C)  TempSrc: Oral     SpO2: 99% 99%  98%  Weight:   97.8 kg (215 lb 9.8 oz)   Height:       Physical Exam General: NAD plesant Heart: RRR Lungs: grossly clear Abdomen: obese soft NT Extremities: no LE  edema left knee wrapped;  left wrist - splint  Dialysis Access: left thigh AVGG + bruit   Additional Objective Labs: Lab Results  Component Value Date   INR 1.56 07/16/2017   INR 1.60 07/15/2017   INR 1.72 33/00/7622    Basic Metabolic Panel: Recent Labs  Lab 07/12/17 0523 07/14/17 0447 07/14/17 1740  NA 137 136 135  K 4.2 3.7 3.3*  CL 99* 96* 96*  CO2 25 25 28   GLUCOSE 86 124* 187*  BUN 41* 31* 7  CREATININE 7.74* 5.72* 2.71*  CALCIUM 8.0* 7.8* 7.4*  PHOS  --   --  2.1*   Liver Function Tests: Recent Labs  Lab 07/14/17 1740  ALBUMIN 2.9*   No results for input(s): LIPASE, AMYLASE in the last 168 hours. CBC: Recent Labs  Lab 07/11/17 2227 07/12/17 0523 07/14/17 0447 07/14/17 1740 07/15/17 0404 07/16/17 0406  WBC 13.7* 11.7* 7.5 7.1 6.9 6.1  NEUTROABS 10.7*  --   --   --   --   --   HGB 11.0* 10.4* 10.3* 10.7* 9.9* 9.8*  HCT 34.4* 32.8* 31.2* 33.9* 31.3* 31.1*  MCV 82.9 81.6 81.7 81.9 82.2 81.6  PLT 154 118* 146* 175 149* 151   Blood Culture    Component  Value Date/Time   SDES ABSCESS LEFT UPPER THIGH 07/14/2017 0811   SPECREQUEST SWAB SPEC A 07/14/2017 0811   CULT  07/14/2017 0811    FEW ENTEROCOCCUS FAECALIS NO ANAEROBES ISOLATED; CULTURE IN PROGRESS FOR 5 DAYS    REPTSTATUS PENDING 07/14/2017 0811    Cardiac Enzymes: No results for input(s): CKTOTAL, CKMB, CKMBINDEX, TROPONINI in the last 168 hours. CBG: Recent Labs  Lab 07/15/17 0815 07/15/17 1146 07/15/17 1731 07/15/17 2146 07/16/17 0817  GLUCAP 124* 149* 162* 134* 113*   Iron Studies: No results for input(s): IRON, TIBC, TRANSFERRIN, FERRITIN in the last 72 hours. Lab Results  Component Value Date   INR 1.56 07/16/2017   INR 1.60 07/15/2017   INR 1.72 07/14/2017   Studies/Results: No results found. Medications: . sodium chloride    . sodium chloride    . heparin 1,350 Units/hr (07/16/17 0520)  . piperacillin-tazobactam (ZOSYN)  IV 3.375 g (07/16/17 0855)  . vancomycin 750  mg (07/14/17 1512)   . carvedilol  3.125 mg Oral BID  . cinacalcet  30 mg Oral Q supper  . collagenase   Topical BID  . docusate sodium  100 mg Oral BID  . doxercalciferol  6 mcg Intravenous Q M,W,F-HD  . famotidine  20 mg Oral Daily  . feeding supplement (PRO-STAT SUGAR FREE 64)  30 mL Oral BID  . gabapentin  300 mg Oral QHS  . insulin aspart  0-9 Units Subcutaneous TID WC  . insulin glargine  3 Units Subcutaneous QHS  . midodrine  10 mg Oral Q M,W,F-HD  . multivitamin  1 tablet Oral QHS  . pantoprazole  40 mg Oral BID AC  . PARoxetine  10 mg Oral Daily  . polyethylene glycol  17 g Oral Daily  . simvastatin  40 mg Oral q1800  . sucralfate  1 g Oral TID WC & HS  . thiamine  100 mg Oral Daily  . Warfarin - Pharmacist Dosing Inpatient   Does not apply [phone]

## 2017-07-17 ENCOUNTER — Inpatient Hospital Stay (HOSPITAL_COMMUNITY): Payer: 59

## 2017-07-17 LAB — RENAL FUNCTION PANEL
Albumin: 2.6 g/dL — ABNORMAL LOW (ref 3.5–5.0)
Anion gap: 15 (ref 5–15)
BUN: 38 mg/dL — ABNORMAL HIGH (ref 6–20)
CO2: 23 mmol/L (ref 22–32)
Calcium: 7.6 mg/dL — ABNORMAL LOW (ref 8.9–10.3)
Chloride: 94 mmol/L — ABNORMAL LOW (ref 101–111)
Creatinine, Ser: 7.49 mg/dL — ABNORMAL HIGH (ref 0.44–1.00)
GFR calc Af Amer: 5 mL/min — ABNORMAL LOW (ref >60)
GFR calc non Af Amer: 5 mL/min — ABNORMAL LOW (ref >60)
Glucose, Bld: 115 mg/dL — ABNORMAL HIGH (ref 65–99)
Phosphorus: 4.8 mg/dL — ABNORMAL HIGH (ref 2.5–4.6)
Potassium: 3.9 mmol/L (ref 3.5–5.1)
Sodium: 132 mmol/L — ABNORMAL LOW (ref 135–145)

## 2017-07-17 LAB — CBC
HCT: 31.9 % — ABNORMAL LOW (ref 36.0–46.0)
Hemoglobin: 10.2 g/dL — ABNORMAL LOW (ref 12.0–15.0)
MCH: 26 pg (ref 26.0–34.0)
MCHC: 32 g/dL (ref 30.0–36.0)
MCV: 81.4 fL (ref 78.0–100.0)
Platelets: 202 10*3/uL (ref 150–400)
RBC: 3.92 MIL/uL (ref 3.87–5.11)
RDW: 17.5 % — ABNORMAL HIGH (ref 11.5–15.5)
WBC: 8 10*3/uL (ref 4.0–10.5)

## 2017-07-17 LAB — PROTIME-INR
INR: 2
INR: 2.12
Prothrombin Time: 22.5 seconds — ABNORMAL HIGH (ref 11.4–15.2)
Prothrombin Time: 23.6 seconds — ABNORMAL HIGH (ref 11.4–15.2)

## 2017-07-17 LAB — CULTURE, BLOOD (ROUTINE X 2)
Culture: NO GROWTH
Culture: NO GROWTH
Special Requests: ADEQUATE
Special Requests: ADEQUATE

## 2017-07-17 LAB — GLUCOSE, CAPILLARY
Glucose-Capillary: 158 mg/dL — ABNORMAL HIGH (ref 65–99)
Glucose-Capillary: 167 mg/dL — ABNORMAL HIGH (ref 65–99)
Glucose-Capillary: 94 mg/dL (ref 65–99)

## 2017-07-17 LAB — HEPARIN LEVEL (UNFRACTIONATED): Heparin Unfractionated: 1.08 IU/mL — ABNORMAL HIGH (ref 0.30–0.70)

## 2017-07-17 MED ORDER — DARBEPOETIN ALFA 60 MCG/0.3ML IJ SOSY
PREFILLED_SYRINGE | INTRAMUSCULAR | Status: AC
  Administered 2017-07-17: 60 ug via INTRAVENOUS
  Filled 2017-07-17: qty 0.3

## 2017-07-17 MED ORDER — PENTAFLUOROPROP-TETRAFLUOROETH EX AERO: 1.0000 "application " | INHALATION_SPRAY | CUTANEOUS | Status: DC | PRN

## 2017-07-17 MED ORDER — LIDOCAINE HCL (PF) 1 % IJ SOLN: 5.0000 mL | INTRAMUSCULAR | Status: DC | PRN

## 2017-07-17 MED ORDER — LIDOCAINE-PRILOCAINE 2.5-2.5 % EX CREA
1.0000 | TOPICAL_CREAM | CUTANEOUS | Status: DC | PRN
Start: 2017-07-17 — End: 2017-07-17

## 2017-07-17 MED ORDER — SODIUM CHLORIDE 0.9 % IV SOLN: 100.0000 mL | INTRAVENOUS | Status: DC | PRN

## 2017-07-17 MED ORDER — HEPARIN SODIUM (PORCINE) 1000 UNIT/ML DIALYSIS: 1000.0000 [IU] | INTRAMUSCULAR | Status: DC | PRN

## 2017-07-17 MED ORDER — HEPARIN SODIUM (PORCINE) 1000 UNIT/ML DIALYSIS: 20.0000 [IU]/kg | INTRAMUSCULAR | Status: DC | PRN

## 2017-07-17 MED ORDER — WARFARIN SODIUM 7.5 MG PO TABS
7.5000 mg | ORAL_TABLET | Freq: Once | ORAL | Status: AC
  Administered 2017-07-17: 7.5 mg via ORAL
  Filled 2017-07-17 (×2): qty 1

## 2017-07-17 MED ORDER — MIDODRINE HCL 5 MG PO TABS
ORAL_TABLET | ORAL | Status: AC
  Administered 2017-07-17: 10 mg via ORAL
  Filled 2017-07-17: qty 2

## 2017-07-17 MED ORDER — DOXERCALCIFEROL 4 MCG/2ML IV SOLN
INTRAVENOUS | Status: AC
  Filled 2017-07-17: qty 4

## 2017-07-17 MED ORDER — ALTEPLASE 2 MG IJ SOLR: 2.0000 mg | Freq: Once | INTRAMUSCULAR | Status: DC | PRN

## 2017-07-17 MED ORDER — DOXERCALCIFEROL 4 MCG/2ML IV SOLN
INTRAVENOUS | Status: AC
  Administered 2017-07-17: 6 ug via INTRAVENOUS
  Filled 2017-07-17: qty 2

## 2017-07-17 NOTE — Procedures (Signed)
I have personally attended this patient's dialysis session.   Pre weight 99.3 kg BP soft L AVG cannulated both needles lateral side 4K bath pending labs (last K 3.3)  Jamal Maes, MD [place] Pager 07/17/2017, 8:27 AM

## 2017-07-17 NOTE — Progress Notes (Signed)
ANTICOAGULATION CONSULT NOTE - Follow Up Consult  Pharmacy Consult for warfarin Indication: history of DVT, protein C deficiency  No Active Allergies  Patient Measurements: Height: 5\' 2"  (157.5 cm) Weight: 218 lb 14.7 oz (99.3 kg) IBW/kg (Calculated) : 50.1 Heparin Dosing Weight: 73 kg  Labs: Recent Labs    07/14/17 1740  07/15/17 0404 07/16/17 0406 07/16/17 1400 07/17/17 0359 07/17/17 0822  HGB 10.7*  --  9.9* 9.8*  --  10.2*  --   HCT 33.9*  --  31.3* 31.1*  --  31.9*  --   PLT 175  --  149* 151  --  202  --   LABPROT  --   --  18.9* 18.5*  --  22.5*  --   INR  --   --  1.60 1.56  --  2.00  --   HEPARINUNFRC  --    < > 0.41 <0.10* 0.61 1.08*  --   CREATININE 2.71*  --   --   --   --   --  7.49*   < > = values in this interval not displayed.    Estimated Creatinine Clearance: 7 mL/min (A) (by C-G formula based on SCr of 7.49 mg/dL (H)).   Assessment: 77 yo F on Coumadin PTA for hx of DVT and protein C deficiency.  Patient was admitted with LLE cellulitis> CT scan noted fluid collection on LLE concerning for abscess / infection with plans for drainage, so INR was reversed for procedure and IV heparin initiated. Now patient is s/p I&D on 1/25 with heparin and warfarin resumed post-op.    Heparin level early this morning was elevated at >1, but INR was in range at 2, so heparin infusion was stopped.  Hgb 10.2, plts 202- stable. Some bleeding around IV site per RN this morning, but easily managed. Home dose of warfarin was 5mg  daily.  Goal of Therapy:  Heparin level 0.3-0.7 units/ml Monitor platelets by anticoagulation protocol: Yes   Plan:  - Recheck INR at 1600 to ensure it stays in range. If it drops below 2, will need to restart heparin - Warfarin 7.5 mg x 1 tonight - can change based on INR results if needed - Daily INR, follow s/s bleeding  Thank you for allowing pharmacy to be a part of this patient's care. Burns Timson D. Azayla Polo, PharmD, BCPS Clinical  Pharmacist Clinical Phone for 07/17/2017 until 3:30pm: [phone] If after 3:30pm, please call main pharmacy at [phone] 07/17/2017 9:50 AM

## 2017-07-17 NOTE — Evaluation (Signed)
Physical Therapy Evaluation Patient Details Name: Ashlee Miles MRN: [id number] DOB: 1941/01/22 Today's Date: 07/17/2017   History of Present Illness  77yo female with chronic wound vac in place L medial thigh, presenting to the ED wit increased L LE erythema and pain L upper leg. Received I&D L thigh 07/14/17. PMH HTN, GERD, protein C deficiency, pituitary adenoma, hx DVT on coumadin, ESRD on dialysis, CHF, DM, chronic wound vac placement, IVC filter, hx thrombectomy with AV Goretex graft   Clinical Impression   Patient received in bed following dialysis today, 7/10 fatigue level but pleasant and willing to participate in PT this afternoon. Per chart review and most recent ortho note (by PA) available, patient seems to be NWB L wrist but is OK to use platform walker. She is able to perform bed mobility with min assist and functional transfers with Mod assist today, able to ambulate approximately 64f to the chair with L platform walker this afternoon. Gait distance and overall activity this session limited by fatigue. RN present during session and is aware of patient's overall mobility level as well as need for NWB L UE/platform walker for now, and reports patient may be getting an xray of her L wrist. Patient left up in chair with all needs met, chair alarm activated this afternoon.      Follow Up Recommendations Home health PT;Supervision/Assistance - 24 hour    Equipment Recommendations  Other (comment)(possible L platform walker pending any updates to WB status L UE )    Recommendations for Other Services       Precautions / Restrictions Precautions Precautions: Fall Precaution Comments: per most recent ortho note, NWB L LE but can use platform walker  Restrictions Weight Bearing Restrictions: Yes LUE Weight Bearing: Non weight bearing Other Position/Activity Restrictions: can use platform walker       Mobility  Bed Mobility Overal bed mobility: Needs Assistance Bed Mobility:  Supine to Sit     Supine to sit: Min assist     General bed mobility comments: to bring legs around and elevate trunk up at edge of bed   Transfers Overall transfer level: Needs assistance Equipment used: Left platform walker Transfers: Sit to/from Stand Sit to Stand: Mod assist         General transfer comment: mod assist to power up to full standing position, Mod cues to correctly use L platform walker   Ambulation/Gait Ambulation/Gait assistance: Min guard Ambulation Distance (Feet): 3 Feet Assistive device: Left platform walker Gait Pattern/deviations: Step-through pattern;Decreased step length - right;Decreased step length - left;Trunk flexed     General Gait Details: limited gait today due to severe fatigue following dialysis, plan to progress on non-dialysis day.   Stairs            Wheelchair Mobility    Modified Rankin (Stroke Patients Only)       Balance Overall balance assessment: Needs assistance Sitting-balance support: Feet supported;Single extremity supported Sitting balance-[name]: Good     Standing balance support: Bilateral upper extremity supported;During functional activity Standing balance-[name]: Fair                               Pertinent Vitals/Pain Pain Assessment: Faces Faces Pain Scale: Hurts a little bit Pain Location: L Hip and LE  Pain Descriptors / Indicators: Discomfort;Grimacing;Sore;Tender Pain Intervention(s): Limited activity within patient's tolerance;Monitored during session;Repositioned    Home Living Family/patient expects to be discharged to:: Private residence  Living Arrangements: Children Available Help at Discharge: Family;Personal care attendant Type of Home: House Home Access: Stairs to enter Entrance Stairs-Rails: Psychiatric nurse of Steps: 3 Home Layout: One level Home Equipment: Toilet riser;Walker - 4 wheels;Shower seat      Prior Function Level of  Independence: Independent   Gait / Transfers Assistance Needed: using RW for mobility  ADL's / Homemaking Assistance Needed: bathing and dressing by PCA        Hand Dominance        Extremity/Trunk Assessment   Upper Extremity Assessment Upper Extremity Assessment: Defer to OT evaluation    Lower Extremity Assessment Lower Extremity Assessment: Generalized weakness    Cervical / Trunk Assessment Cervical / Trunk Assessment: Kyphotic  Communication   Communication: No difficulties  Cognition Arousal/Alertness: Awake/alert Behavior During Therapy: WFL for tasks assessed/performed Overall Cognitive Status: Within Functional Limits for tasks assessed                                        General Comments General comments (skin integrity, edema, etc.): per most recent ortho note (by PA), strict NWB L wrist but can use platform walker; RN reports today patient is supposed to be getting xray so this may change/be updated soon     Exercises     Assessment/Plan    PT Assessment Patient needs continued PT services  PT Problem List Decreased strength;Decreased mobility;Decreased safety awareness;Decreased coordination;Decreased activity tolerance;Decreased balance;Pain;Decreased knowledge of use of DME;Decreased knowledge of precautions       PT Treatment Interventions DME instruction;Therapeutic activities;Gait training;Therapeutic exercise;Patient/family education;Stair training;Balance training;Functional mobility training;Neuromuscular re-education    PT Goals (Current goals can be found in the Care Plan section)  Acute Rehab PT Goals Patient Stated Goal: to get better PT Goal Formulation: With patient Potential to Achieve Goals: Good    Frequency Min 3X/week   Barriers to discharge        Co-evaluation               AM-PAC PT "6 Clicks" Daily Activity  Outcome Measure Difficulty turning over in bed (including adjusting bedclothes,  sheets and blankets)?: Unable Difficulty moving from lying on back to sitting on the side of the bed? : Unable Difficulty sitting down on and standing up from a chair with arms (e.g., wheelchair, bedside commode, etc,.)?: Unable Help needed moving to and from a bed to chair (including a wheelchair)?: A Little Help needed walking in hospital room?: A Little Help needed climbing 3-5 steps with a railing? : A Lot 6 Click Score: 11    End of Session Equipment Utilized During Treatment: Gait belt;Other (comment)(brace L wrist ) Activity Tolerance: Patient limited by fatigue Patient left: in chair;with chair alarm set;with call bell/phone within reach Nurse Communication: Mobility status;Weight bearing status(RN present during session, aware of patient's mobility and NWB status L UE as well as platform walker ) PT Visit Diagnosis: Muscle weakness (generalized) (M62.81);Difficulty in walking, not elsewhere classified (R26.2);Unsteadiness on feet (R26.81);History of falling (Z91.81) Pain - Right/Left: Left Pain - part of body: Leg    Time: 1541-1600 PT Time Calculation (min) (ACUTE ONLY): 19 min   Charges:   PT Evaluation $PT Eval Moderate Complexity: 1 Mod     PT G Codes:        Deniece Ree PT, DPT, CBIS  Supplemental Physical Therapist [place]   Pager [phone]

## 2017-07-17 NOTE — Progress Notes (Addendum)
[facility]  Subjective  - She has been in HD the first half of the day and now starting to work with PT.  No new complaints.    Objective (!) 131/32 65 98.8 F (37.1 C) (Oral) 14 100%  Intake/Output Summary (Last 24 hours) at 07/17/2017 1554 Last data filed at 07/17/2017 1142 Gross per 24 hour  Intake -  Output 1820 ml  Net -1820 ml    Left thigh slight tenderness to palpation above knee dressing. Dressing clean and dry. HD dressing in place without active bleeding. Left foot active range of motion intact, foot is warm to touch and sensation grossly intact to touch.    Assessment/Planning: POD # 3 irrigation and debridement of above knee abscess Cultures grew ENTEROCOCCUS FAECALIS now on IV Unasyn per sensitives.  Plan at discharge will be PO amoxicillin. WBC elevated 8.0 from 6.0, she continues to be Afebrile.   The left thigh graft is working well  Dressing clean and dry, will plan to change it tomorrow for wound inspection.  Patient currently working with PT and has been out of room first half of the day for HD.     Roxy Horseman 07/17/2017 3:54 PM --  Laboratory Lab Results: Recent Labs    07/16/17 0406 07/17/17 0359  WBC 6.1 8.0  HGB 9.8* 10.2*  HCT 31.1* 31.9*  PLT 151 202   BMET Recent Labs    07/14/17 1740 07/17/17 0822  NA 135 132*  K 3.3* 3.9  CL 96* 94*  CO2 28 23  GLUCOSE 187* 115*  BUN 7 38*  CREATININE 2.71* 7.49*  CALCIUM 7.4* 7.6*    COAG Lab Results  Component Value Date   INR 2.00 07/17/2017   INR 1.56 07/16/2017   INR 1.60 07/15/2017   No results found for: PTT  Dressing change by [facility] tomorrow  Annamarie Major

## 2017-07-17 NOTE — Progress Notes (Signed)
ANTICOAGULATION CONSULT NOTE - Follow Up Consult  Pharmacy Consult for heparin Indication: h/o DVT w/ protein C deficiency  Labs: Recent Labs    07/14/17 1740  07/15/17 0404 07/16/17 0406 07/16/17 1400 07/17/17 0359  HGB 10.7*  --  9.9* 9.8*  --  10.2*  HCT 33.9*  --  31.3* 31.1*  --  31.9*  PLT 175  --  149* 151  --  PENDING  LABPROT  --   --  18.9* 18.5*  --  22.5*  INR  --   --  1.60 1.56  --  2.00  HEPARINUNFRC  --    < > 0.41 <0.10* 0.61 1.08*  CREATININE 2.71*  --   --   --   --   --    < > = values in this interval not displayed.    Assessment/Plan:  77yo female above goal on heparin after one level at goal though INR now at goal; RN notes some bleeding at IV site but easily managed. Will d/c heparin for now and consider resuming if INR drops below 2.   Wynona Neat, PharmD, BCPS  07/17/2017,6:14 AM

## 2017-07-17 NOTE — Progress Notes (Addendum)
[facility] PROGRESS NOTE  Ashlee Miles EKC:[id number] DOB: 1941/05/08 DOA: 07/11/2017  PCP: Darlina Rumpf, MD  Brief History/Interval Summary: 77 year old female with a past medical history of end-stage renal disease on hemodialysis, hypertension, hyperlipidemia, protein C deficiency, history of pituitary adenoma, history of DVT on warfarin, type 2 diabetes, wound VAC in the left lower extremity presented with increasing pain and erythema of the left upper leg.  Patient recently underwent left thigh graft thrombectomy and had a wound with wound VAC.  Patient was hospitalized for further management.  CT scan shows fluid collection in the left lower extremity.  Patient was taken to the OR on 1/25 for drainage.  Reason for Visit: Cellulitis and abscess, left leg  Consultants: Vascular surgery.  Nephrology.  Procedures:  1.  Incision and drainage of abscess left thigh 2.  Excisional debridement of wound left thigh (subcutaneous tissue)  Antibiotics: Vancomycin and Zosyn Changed to Unasyn  Subjective/Interval History: Patient continues to have pain in the left leg however better controlled compared to yesterday morning.  She is also inquiring about her left wrist and wonders if it can be evaluated while she is in the hospital.  She denies any pain.    ROS: Denies any nausea or vomiting  Objective:  Vital Signs  Vitals:   07/17/17 0745 07/17/17 0815 07/17/17 0830 07/17/17 0900  BP: (!) 147/66 (!) 116/51 (!) 121/59 (!) 101/50  Pulse: 64 61 (!) 59 (!) 57  Resp: 11     Temp:      TempSrc:      SpO2:      Weight:      Height:       No intake or output data in the 24 hours ending 07/17/17 0924 Filed Weights   07/16/17 0500 07/17/17 0404 07/17/17 0735  Weight: 97.8 kg (215 lb 9.8 oz) 99.9 kg (220 lb 3.8 oz) 99.3 kg (218 lb 14.7 oz)    General appearance: Awake alert.  In no distress Resp: Clear to auscultation bilaterally.  No wheezing rales or rhonchi Cardio: S1-S2 is  normal regular.  No S3-S4.  No rubs murmurs or bruit GI: Abdomen remains soft.  Nontender nondistended.  Bowel sounds are present.  No masses organomegaly Extremities: Left lower extremity covered in dressing. Neurologic: No obvious focal neurological deficits.  Lab Results:  Data Reviewed: I have personally reviewed following labs and imaging studies  CBC: Recent Labs  Lab 07/11/17 2227  07/14/17 0447 07/14/17 1740 07/15/17 0404 07/16/17 0406 07/17/17 0359  WBC 13.7*   < > 7.5 7.1 6.9 6.1 8.0  NEUTROABS 10.7*  --   --   --   --   --   --   HGB 11.0*   < > 10.3* 10.7* 9.9* 9.8* 10.2*  HCT 34.4*   < > 31.2* 33.9* 31.3* 31.1* 31.9*  MCV 82.9   < > 81.7 81.9 82.2 81.6 81.4  PLT 154   < > 146* 175 149* 151 202   < > = values in this interval not displayed.    Basic Metabolic Panel: Recent Labs  Lab 07/11/17 2227 07/12/17 0523 07/14/17 0447 07/14/17 1740 07/17/17 0822  NA 135 137 136 135 132*  K 4.0 4.2 3.7 3.3* 3.9  CL 98* 99* 96* 96* 94*  CO2 22 25 25 28 23   GLUCOSE 239* 86 124* 187* 115*  BUN 39* 41* 31* 7 38*  CREATININE 6.94* 7.74* 5.72* 2.71* 7.49*  CALCIUM 8.1* 8.0* 7.8* 7.4* 7.6*  PHOS  --   --   --  2.1* 4.8*    GFR: Estimated Creatinine Clearance: 7 mL/min (A) (by C-G formula based on SCr of 7.49 mg/dL (H)).  Coagulation Profile: Recent Labs  Lab 07/13/17 0441 07/14/17 0447 07/15/17 0404 07/16/17 0406 07/17/17 0359  INR 2.02 1.72 1.60 1.56 2.00   CBG: Recent Labs  Lab 07/15/17 2146 07/16/17 0817 07/16/17 1226 07/16/17 1739 07/16/17 2226  GLUCAP 134* 113* 184* 150* 102*   Results for orders placed or performed during the hospital encounter of 07/11/17  Culture, blood (routine x 2)     Status: None (Preliminary result)   Collection Time: 07/12/17 10:40 AM  Result Value Ref Range Status   Specimen Description BLOOD RIGHT ARM  Final   Special Requests   Final    BOTTLES DRAWN AEROBIC AND ANAEROBIC Blood Culture adequate volume   Culture NO  GROWTH 4 DAYS  Final   Report Status PENDING  Incomplete  Culture, blood (routine x 2)     Status: None (Preliminary result)   Collection Time: 07/12/17 10:50 AM  Result Value Ref Range Status   Specimen Description BLOOD RIGHT HAND  Final   Special Requests   Final    BOTTLES DRAWN AEROBIC AND ANAEROBIC Blood Culture adequate volume   Culture NO GROWTH 4 DAYS  Final   Report Status PENDING  Incomplete  MRSA PCR Screening     Status: None   Collection Time: 07/13/17  9:29 AM  Result Value Ref Range Status   MRSA by PCR NEGATIVE NEGATIVE Final    Comment:        The GeneXpert MRSA Assay (FDA approved for NASAL specimens only), is one component of a comprehensive MRSA colonization surveillance program. It is not intended to diagnose MRSA infection nor to guide or monitor treatment for MRSA infections.   Aerobic/Anaerobic Culture (surgical/deep wound)     Status: None (Preliminary result)   Collection Time: 07/14/17  8:11 AM  Result Value Ref Range Status   Specimen Description ABSCESS LEFT UPPER THIGH  Final   Special Requests SWAB SPEC A  Final   Gram Stain   Final    MODERATE WBC PRESENT, PREDOMINANTLY PMN NO ORGANISMS SEEN    Culture   Final    FEW ENTEROCOCCUS FAECALIS NO ANAEROBES ISOLATED; CULTURE IN PROGRESS FOR 5 DAYS    Report Status PENDING  Incomplete   Organism ID, Bacteria ENTEROCOCCUS FAECALIS  Final      Susceptibility   Enterococcus faecalis - MIC*    AMPICILLIN <=2 SENSITIVE Sensitive     VANCOMYCIN 2 SENSITIVE Sensitive     GENTAMICIN SYNERGY SENSITIVE Sensitive     * FEW ENTEROCOCCUS FAECALIS    Radiology Studies: No results found.   Medications:  Scheduled: . carvedilol  3.125 mg Oral BID  . cinacalcet  30 mg Oral Q supper  . collagenase   Topical BID  . darbepoetin (ARANESP) injection - DIALYSIS  60 mcg Intravenous Q Mon-HD  . docusate sodium  100 mg Oral BID  . doxercalciferol  6 mcg Intravenous Q M,W,F-HD  . famotidine  20 mg Oral Daily   . feeding supplement (PRO-STAT SUGAR FREE 64)  30 mL Oral BID  . gabapentin  300 mg Oral QHS  . insulin aspart  0-9 Units Subcutaneous TID WC  . insulin glargine  3 Units Subcutaneous QHS  . midodrine  10 mg Oral Q M,W,F-HD  . multivitamin  1 tablet Oral QHS  . pantoprazole  40 mg Oral BID AC  . PARoxetine  10 mg Oral Daily  . polyethylene glycol  17 g Oral Daily  . simvastatin  40 mg Oral q1800  . sucralfate  1 g Oral TID WC & HS  . thiamine  100 mg Oral Daily  . Warfarin - Pharmacist Dosing Inpatient   Does not apply q1800   Continuous: . sodium chloride    . sodium chloride    . sodium chloride    . sodium chloride    . ampicillin-sulbactam (UNASYN) IV Stopped (07/16/17 2035)   MOQ:HUTMLY chloride, sodium chloride, sodium chloride, sodium chloride, acetaminophen **OR** acetaminophen, alteplase, heparin, heparin, hydrALAZINE, ipratropium-albuterol, lidocaine (PF), lidocaine-prilocaine, linaclotide, ondansetron **OR** ondansetron (ZOFRAN) IV, oxyCODONE-acetaminophen, pentafluoroprop-tetrafluoroeth, phenol, senna-docusate, sodium chloride flush, triamcinolone cream, zolpidem  Assessment/Plan:  Principal Problem:   Cellulitis of left lower extremity Active Problems:   Essential hypertension   DVT (deep venous thrombosis) (HCC)   ESRD on dialysis ([place])   Anemia due to chronic kidney disease   Diabetes mellitus with end stage renal disease (HCC)   GERD (gastroesophageal reflux disease)   Protein C deficiency (HCC)   Leg wound, left   HLD (hyperlipidemia)    Cellulitis and possible abscess of the left lower extremity in the setting of chronic wound and recent surgery Patient underwent vascular surgery recently to that area.  She had a wound VAC in place.  CT scan raised concern for fluid collection.  Vascular surgery was consulted.  Patient was placed on vancomycin and Zosyn.  Patient taken to the operating room on 1/25 for surgical drainage.  Seems to be stable  postoperatively.  Lactic acid level was normal.  Pro-calcitonin 8.26.  WBC was noted to be elevated and has been normal subsequently.  Cultures from the drain fluid positive for enterococcus.  Discussed with infectious disease and vascular surgery yesterday.  She was changed over to Unasyn.  MRSA PCR has been negative previously.  Blood cultures have been without any growth.  Wound care per vascular surgery.  Once wound is thought to be stable then she could be discharged home on oral amoxicillin.  Apparently the infection does not appear to have affected the graft.    History of DVT and protein C deficiency She was on warfarin which was reversed for surgery.  Patient was placed on IV heparin.  Warfarin has been resumed.  NR is therapeutic.  Pharmacy is managing.  History of essential hypertension Blood pressure was noted to be low at hemodialysis.  Appears to have stabilized.  Dose of carvedilol was reduced.  Blood pressures have stabilized.  Continue to monitor.  She is also noted to be on midodrine.    End-stage renal disease on hemodialysis (MWF) Nephrology is following.  She is being dialyzed as per her usual schedule.  Discussed with nephrology today.  Anemia chronic kidney disease Hemoglobin stable.  No evidence of overt bleeding.  Continue to monitor.  History of diabetes mellitus type 2 in the setting of end-stage renal disease Continue Lantus.  CBGs are reasonably well controlled.  History of GERD Continue home medications.  Comminuted fracture left radius Seen by [facility] previously.  Plan was to follow-up at the end of this month.  We will repeat x-ray.  Continue splint.  DVT Prophylaxis: On Warfarin Code Status: DNR Family Communication: Discussed with the patient Disposition Plan: Management as outlined above.   PT and OT evaluation is pending.      LOS: 5 days   [facility] Pager [phone] 07/17/2017, 9:24 AM  If 7PM-7AM, please  contact night-coverage at [email or web address], password [facility]

## 2017-07-17 NOTE — Progress Notes (Signed)
[facility] Progress Note   Dialysis Orders: MWF -NW 4h    98.5kg   3k?2.25 bath   L thigh AVG    Hep 3000  Hectorol 60mcg IV qHD TIW Venofer 100mg  IV qHD mircera 140mcg IV q2wks - no longer ordered, last 1/11 hgb 12 (was on 225 prior to that)  Assessment: 1. Lthigh cellulitis/ abscess - sp abscess drainage 1/25 Dr Scot Dock- on Vanc and Zosyn wound culture + enterococcus faecalis - BC no growth. Will need to know the ATB plan. 2. ESRD- HD MWF, using thigh AVG w/o difficulty (can only cannulate the lateral side) - HD today 4K bath pending labs 3. BP/volume- BP soft, under EDW last TMT (pre weight today 99.3 kg), titrate down as tol, takes midodrine 10 tid and coreg 6.25 mg bid at home- now reduced to 3.125 bid 4. Anemia- Hgb 9.8. -resumed Aranesp 60 Holding iron due to infection. 5. Secondary Hyperparathyroidism -1/25 Ca 7.4. P 2.1 binders stopped 1/27- repeat labs with HD Monday , VDRA, and sensipar. 6. Nutrition- Renal diet with fluid restrictions. Renavite.  7. H/o DVT/protein C def - pharmacy titrating INR up. Lifelong anticoagulation. 8. DMT2 9. GERD- on carafate - should only be used short term due to aluminum content; also pepcid 10. Left wrist fx - wearing splint - fell in November - seen by ortho a few weeks ago - needs f/u - due to see this week  Jamal Maes, MD [place] Pager 07/17/2017, 8:13 AM  Subjective:    Seen on hemodialysis In good spirits Leg pain tolerable AVG cannulated   Objective Vitals:   07/17/17 0637 07/17/17 0735 07/17/17 0741 07/17/17 0745  BP: (!) 98/37 (!) 116/50 (!) 129/57 (!) 147/66  Pulse: 61 64 63 64  Resp:  (!) 22 11 11   Temp:  (!) 97.4 F (36.3 C)    TempSrc:  Oral    SpO2:  99%    Weight:  99.3 kg (218 lb 14.7 oz)    Height:       Physical Exam General: NAD pleasant and seen on HD Regular, normal heart sounds, no rub Lungs anteriorly clear Abdomen obese and not  tender Extremities: no LE edema left LE wrapped;  left wrist - splinted  Dialysis Access: left thigh AVG + bruit cannulated  Lab Results  Component Value Date   INR 2.00 07/17/2017   INR 1.56 07/16/2017   INR 1.60 07/15/2017   Recent Labs  Lab 07/12/17 0523 07/14/17 0447 07/14/17 1740  NA 137 136 135  K 4.2 3.7 3.3*  CL 99* 96* 96*  CO2 25 25 28   GLUCOSE 86 124* 187*  BUN 41* 31* 7  CREATININE 7.74* 5.72* 2.71*  CALCIUM 8.0* 7.8* 7.4*  PHOS  --   --  2.1*    Recent Labs  Lab 07/14/17 1740  ALBUMIN 2.9*    Recent Labs  Lab 07/11/17 2227  07/14/17 0447 07/14/17 1740 07/15/17 0404 07/16/17 0406 07/17/17 0359  WBC 13.7*   < > 7.5 7.1 6.9 6.1 8.0  NEUTROABS 10.7*  --   --   --   --   --   --   HGB 11.0*   < > 10.3* 10.7* 9.9* 9.8* 10.2*  HCT 34.4*   < > 31.2* 33.9* 31.3* 31.1* 31.9*  MCV 82.9   < > 81.7 81.9 82.2 81.6 81.4  PLT 154   < > 146* 175 149* 151 202   < > = values in  this interval not displayed.       Component Value Date/Time   SDES ABSCESS LEFT UPPER THIGH 07/14/2017 0811   SPECREQUEST SWAB SPEC A 07/14/2017 0811   CULT  07/14/2017 0811    FEW ENTEROCOCCUS FAECALIS NO ANAEROBES ISOLATED; CULTURE IN PROGRESS FOR 5 DAYS    REPTSTATUS PENDING 07/14/2017 0811     Recent Labs  Lab 07/15/17 2146 07/16/17 0817 07/16/17 1226 07/16/17 1739 07/16/17 2226  GLUCAP 134* 113* 184* 150* 102*    Lab Results  Component Value Date   INR 2.00 07/17/2017   INR 1.56 07/16/2017   INR 1.60 07/15/2017   Medications: . sodium chloride    . sodium chloride    . sodium chloride    . sodium chloride    . ampicillin-sulbactam (UNASYN) IV Stopped (07/16/17 2035)   . carvedilol  3.125 mg Oral BID  . cinacalcet  30 mg Oral Q supper  . collagenase   Topical BID  . darbepoetin (ARANESP) injection - DIALYSIS  60 mcg Intravenous Q Mon-HD  . docusate sodium  100 mg Oral BID  . doxercalciferol  6 mcg Intravenous Q M,W,F-HD  . famotidine  20 mg Oral Daily  .  feeding supplement (PRO-STAT SUGAR FREE 64)  30 mL Oral BID  . gabapentin  300 mg Oral QHS  . insulin aspart  0-9 Units Subcutaneous TID WC  . insulin glargine  3 Units Subcutaneous QHS  . midodrine  10 mg Oral Q M,W,F-HD  . multivitamin  1 tablet Oral QHS  . pantoprazole  40 mg Oral BID AC  . PARoxetine  10 mg Oral Daily  . polyethylene glycol  17 g Oral Daily  . simvastatin  40 mg Oral q1800  . sucralfate  1 g Oral TID WC & HS  . thiamine  100 mg Oral Daily  . Warfarin - Pharmacist Dosing Inpatient   Does not apply [phone]

## 2017-07-17 NOTE — Progress Notes (Signed)
OT Cancellation Note    07/17/17 0800  OT Visit Information  Last OT Received On 07/17/17  Reason Eval/Treat Not Completed Patient at procedure or test/ unavailable  [facility], OT/L  [phone] 07/17/2017

## 2017-07-18 LAB — RENAL FUNCTION PANEL
Albumin: 2.6 g/dL — ABNORMAL LOW (ref 3.5–5.0)
Anion gap: 12 (ref 5–15)
BUN: 23 mg/dL — ABNORMAL HIGH (ref 6–20)
CO2: 25 mmol/L (ref 22–32)
Calcium: 7.4 mg/dL — ABNORMAL LOW (ref 8.9–10.3)
Chloride: 98 mmol/L — ABNORMAL LOW (ref 101–111)
Creatinine, Ser: 5.44 mg/dL — ABNORMAL HIGH (ref 0.44–1.00)
GFR calc Af Amer: 8 mL/min — ABNORMAL LOW (ref >60)
GFR calc non Af Amer: 7 mL/min — ABNORMAL LOW (ref >60)
Glucose, Bld: 99 mg/dL (ref 65–99)
Phosphorus: 3.4 mg/dL (ref 2.5–4.6)
Potassium: 3.9 mmol/L (ref 3.5–5.1)
Sodium: 135 mmol/L (ref 135–145)

## 2017-07-18 LAB — GLUCOSE, CAPILLARY
Glucose-Capillary: 86 mg/dL (ref 65–99)
Glucose-Capillary: 125 mg/dL — ABNORMAL HIGH (ref 65–99)
Glucose-Capillary: 216 mg/dL — ABNORMAL HIGH (ref 65–99)
Glucose-Capillary: 254 mg/dL — ABNORMAL HIGH (ref 65–99)

## 2017-07-18 LAB — CBC
HCT: 32.1 % — ABNORMAL LOW (ref 36.0–46.0)
Hemoglobin: 10.4 g/dL — ABNORMAL LOW (ref 12.0–15.0)
MCH: 26.6 pg (ref 26.0–34.0)
MCHC: 32.4 g/dL (ref 30.0–36.0)
MCV: 82.1 fL (ref 78.0–100.0)
Platelets: 202 10*3/uL (ref 150–400)
RBC: 3.91 MIL/uL (ref 3.87–5.11)
RDW: 18 % — ABNORMAL HIGH (ref 11.5–15.5)
WBC: 8.5 10*3/uL (ref 4.0–10.5)

## 2017-07-18 LAB — PROTIME-INR
INR: 2.25
Prothrombin Time: 24.7 seconds — ABNORMAL HIGH (ref 11.4–15.2)

## 2017-07-18 MED ORDER — WARFARIN SODIUM 5 MG PO TABS
5.0000 mg | ORAL_TABLET | Freq: Once | ORAL | Status: AC
  Administered 2017-07-18: 5 mg via ORAL
  Filled 2017-07-18: qty 1

## 2017-07-18 MED ORDER — LOPERAMIDE HCL 2 MG PO CAPS
4.0000 mg | ORAL_CAPSULE | Freq: Once | ORAL | Status: AC
  Administered 2017-07-18: 4 mg via ORAL
  Filled 2017-07-18: qty 2

## 2017-07-18 NOTE — NC FL2 (Signed)
[place] LEVEL OF CARE SCREENING TOOL     IDENTIFICATION  Patient Name: Ashlee Miles Birthdate: 09-Jul-1940 Sex: female Admission Date (Current Location): 07/11/2017  [facility] and [place] Number:  Herbalist and Address:  [facility] [address], [place], [place] [address]      Provider Number: [id number]  Attending Physician Name and Address:  Bonnielee Haff, MD  Relative Name and Phone Number:       Current Level of Care: Hospital Recommended Level of Care: [place] Prior Approval Number:    Date Approved/Denied:   PASRR Number: [id number]  Discharge Plan: SNF    Current Diagnoses: Patient Active Problem List   Diagnosis Date Noted  . Leg wound, left 07/12/2017  . Cellulitis of left lower extremity 07/12/2017  . HLD (hyperlipidemia) 07/12/2017  . Chest pain in adult 06/22/2017  . Elevated troponin   . Altered mental status   . 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 Depression 05/31/2017  . GERD (gastroesophageal reflux disease) 05/31/2017  . Left groin pain 05/31/2017  . Protein C deficiency ([place])   . 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 Falls 04/25/2017  . Hypotension 04/25/2017  . Epigastric pain 04/25/2017  . Constipation 04/25/2017  . ESRD on dialysis ([place]) 04/06/2017  . Anemia due to chronic kidney disease 04/06/2017  . Thrombocytopenia ([place]) 04/06/2017  . Cough in adult 04/06/2017  . DVT (deep venous thrombosis) ([place]) 04/05/2017  . Pressure injury of skin 03/14/2017  . Acute metabolic encephalopathy 45/36/4680  . Skin lesion of hand 03/12/2017  . TIA (transient ischemic attack) 03/01/2017  . Syncope 03/01/2017  . Diabetes mellitus with complication ([place]) 32/05/2481  . Essential hypertension 03/01/2017  . Chronic diastolic CHF (congestive heart failure) ([place]) 03/01/2017    Orientation RESPIRATION BLADDER Height & Weight     Self, Time, Situation, Place  Normal Continent Weight: 217 lb 13 oz (98.8 kg) Height:  5\' 2"  (157.5 cm)  BEHAVIORAL SYMPTOMS/MOOD NEUROLOGICAL BOWEL NUTRITION STATUS      Continent Diet(carb modified; renal)  AMBULATORY STATUS COMMUNICATION OF NEEDS Skin   Limited Assist Verbally Other (Comment)(left thigh wounds- moist to dry dressing and compression wrap changed 2x daily)                       Personal Care Assistance Level of Assistance  Bathing, Dressing Bathing Assistance: Limited assistance Feeding assistance: Independent Dressing Assistance: Limited assistance     Functional Limitations Info             SPECIAL CARE FACTORS FREQUENCY  PT (By licensed PT), OT (By licensed OT)     PT Frequency: 5/wk OT Frequency: 5/wk            Contractures      Additional Factors Info  Code Status, Allergies, Insulin Sliding Scale Code Status Info: DNR Allergies Info: NKA   Insulin Sliding Scale Info: 4/day       Current Medications (07/18/2017):  This is the current hospital active medication list Current Facility-Administered Medications  Medication Dose Route Frequency Provider Last Rate Last Dose  . acetaminophen (TYLENOL) tablet 650 mg  650 mg Oral Q6H PRN Ivor Costa, MD       Or  . acetaminophen (TYLENOL) suppository 650 mg  650 mg Rectal Q6H PRN Ivor Costa, MD      . Ampicillin-Sulbactam (UNASYN) 3 g in sodium chloride 0.9 % 100 mL IVPB  3  g Intravenous Q24H Rolla Flatten, [place] at 07/17/17 2251  . carvedilol (COREG) tablet 3.125 mg  3.125 mg Oral BID Bonnielee Haff, MD   3.125 mg at 07/18/17 1107  . cinacalcet (SENSIPAR) tablet 30 mg  30 mg Oral Q supper Penninger, Lindsay, PA   30 mg at 07/17/17 1755  . collagenase (SANTYL) ointment   Topical BID Conrad Barlow, MD      . Darbepoetin Alfa (ARANESP) injection 60 mcg  60 mcg Intravenous Q Mon-HD Alric Seton, PA-C   60 mcg at 07/17/17 1055  . docusate sodium (COLACE) capsule 100 mg  100 mg Oral BID Ivor Costa, MD   100 mg at  07/16/17 2246  . doxercalciferol (HECTOROL) injection 6 mcg  6 mcg Intravenous Q M,W,F-HD Penninger, Lindsay, PA   6 mcg at 07/17/17 1054  . famotidine (PEPCID) tablet 20 mg  20 mg Oral Daily Ivor Costa, MD   20 mg at 07/18/17 1108  . feeding supplement (PRO-STAT SUGAR FREE 64) liquid 30 mL  30 mL Oral BID Ivor Costa, MD   30 mL at 07/17/17 2221  . gabapentin (NEURONTIN) capsule 300 mg  300 mg Oral QHS Ivor Costa, MD   300 mg at 07/17/17 2222  . hydrALAZINE (APRESOLINE) injection 5 mg  5 mg Intravenous Q2H PRN Ivor Costa, MD      . insulin aspart (novoLOG) injection 0-9 Units  0-9 Units Subcutaneous TID WC Ivor Costa, MD   3 Units at 07/17/17 1755  . insulin glargine (LANTUS) injection 3 Units  3 Units Subcutaneous QHS Ivor Costa, MD   3 Units at 07/17/17 2222  . ipratropium-albuterol (DUONEB) 0.5-2.5 (3) MG/3ML nebulizer solution 3 mL  3 mL Nebulization Q4H PRN Ivor Costa, MD      . linaclotide Rolan Lipa) capsule 145 mcg  145 mcg Oral Daily PRN Ivor Costa, MD      . midodrine (PROAMATINE) tablet 10 mg  10 mg Oral Q M,W,F-HD Ivor Costa, MD   10 mg at 07/17/17 0759  . multivitamin (RENA-VIT) tablet 1 tablet  1 tablet Oral QHS Penninger, [place], [place]   1 tablet at 07/17/17 2222  . ondansetron (ZOFRAN) tablet 4 mg  4 mg Oral Q6H PRN Ivor Costa, MD       Or  . ondansetron [facility]) injection 4 mg  4 mg Intravenous Q6H PRN Ivor Costa, MD      . oxyCODONE-acetaminophen (PERCOCET/ROXICET) 5-325 MG per tablet 1-2 tablet  1-2 tablet Oral Q4H PRN Bonnielee Haff, MD   2 tablet at 07/18/17 0904  . pantoprazole (PROTONIX) EC tablet 40 mg  40 mg Oral BID Renella Cunas, MD   40 mg at 07/18/17 0904  . PARoxetine (PAXIL) tablet 10 mg  10 mg Oral Daily Ivor Costa, MD   10 mg at 07/18/17 1106  . phenol (CHLORASEPTIC) mouth spray 1 spray  1 spray Mouth/Throat PRN Bonnielee Haff, MD   1 spray at 07/16/17 1255  . polyethylene glycol (MIRALAX / GLYCOLAX) packet 17 g  17 g Oral Daily Ivor Costa, MD   17 g at 07/15/17 0909   . senna-docusate (Senokot-S) tablet 1 tablet  1 tablet Oral QHS PRN Ivor Costa, MD      . simvastatin (ZOCOR) tablet 40 mg  40 mg Oral q1800 Ivor Costa, MD   40 mg at 07/17/17 1755  . sodium chloride flush (NS) 0.9 % injection 10-40 mL  10-40 mL Intracatheter PRN Orlie Dakin, MD   20  mL at 07/15/17 0420  . sucralfate (CARAFATE) 1 GM/10ML suspension 1 g  1 g Oral TID WC & HS Ivor Costa, MD   1 g at 07/18/17 0904  . thiamine (VITAMIN B-1) tablet 100 mg  100 mg Oral Daily Ivor Costa, MD   100 mg at 07/18/17 1107  . triamcinolone cream (KENALOG) 0.1 % 1 application  1 application Topical P38S PRN Ivor Costa, MD      . warfarin (COUMADIN) tablet 5 mg  5 mg Oral ONCE-1800 Bonnielee Haff, MD      . Warfarin - Pharmacist Dosing Inpatient   Does not apply q1800 Hammons, Theone Murdoch, RPH      . zolpidem (AMBIEN) tablet 5 mg  5 mg Oral QHS PRN Ivor Costa, MD   5 mg at 07/17/17 2222     Discharge Medications: Please see discharge summary for a list of discharge medications.  Relevant Imaging Results:  Relevant Lab Results:   Additional Information SS#: [id number]. HD MWF at [facility].  Jorge Ny, LCSW

## 2017-07-18 NOTE — Progress Notes (Signed)
Patient ID: Ashlee Miles, female   DOB: 1941/04/01, 77 y.o.   MRN: [id number]  [facility] Progress Note    Subjective:   No events overnight and no complaints.   Objective:   BP (!) 135/41 (BP Location: Right Arm)   Pulse 68   Temp 97.7 F (36.5 C) (Oral)   Resp 18   Ht 5\' 2"  (1.575 m)   Wt 98.8 kg (217 lb 13 oz)   SpO2 100%   BMI 39.84 kg/m   Intake/Output: I/O last 3 completed shifts: In: 200 [IV Piggyback:200] Out: 4098 [Other:1820]   Intake/Output this shift:  Total I/O In: -  Out: 2 [Urine:1; Stool:1] Weight change: -0.6 kg (-5.2 oz)  Physical Exam: Gen: NAD CVS: no rub Resp: CTA Abd: +BS, soft, NT Ext: no edema, L thigh AVG +T/B, left leg above knee wrapped in ace-bandage. No erythema or warmth.  Labs: BMET Recent Labs  Lab 07/11/17 2227 07/12/17 0523 07/14/17 0447 07/14/17 1740 07/17/17 0822 07/18/17 0418  NA 135 137 136 135 132* 135  K 4.0 4.2 3.7 3.3* 3.9 3.9  CL 98* 99* 96* 96* 94* 98*  CO2 22 25 25 28 23 25   GLUCOSE 239* 86 124* 187* 115* 99  BUN 39* 41* 31* 7 38* 23*  CREATININE 6.94* 7.74* 5.72* 2.71* 7.49* 5.44*  ALBUMIN  --   --   --  2.9* 2.6* 2.6*  CALCIUM 8.1* 8.0* 7.8* 7.4* 7.6* 7.4*  PHOS  --   --   --  2.1* 4.8* 3.4   CBC Recent Labs  Lab 07/11/17 2227  07/15/17 0404 07/16/17 0406 07/17/17 0359 07/18/17 0418  WBC 13.7*   < > 6.9 6.1 8.0 8.5  NEUTROABS 10.7*  --   --   --   --   --   HGB 11.0*   < > 9.9* 9.8* 10.2* 10.4*  HCT 34.4*   < > 31.3* 31.1* 31.9* 32.1*  MCV 82.9   < > 82.2 81.6 81.4 82.1  PLT 154   < > 149* 151 202 202   < > = values in this interval not displayed.    @IMGRELPRIORS @ Medications:    . carvedilol  3.125 mg Oral BID  . cinacalcet  30 mg Oral Q supper  . collagenase   Topical BID  . darbepoetin (ARANESP) injection - DIALYSIS  60 mcg Intravenous Q Mon-HD  . docusate sodium  100 mg Oral BID  . doxercalciferol  6 mcg Intravenous Q M,W,F-HD  . famotidine  20 mg Oral Daily  .  feeding supplement (PRO-STAT SUGAR FREE 64)  30 mL Oral BID  . gabapentin  300 mg Oral QHS  . insulin aspart  0-9 Units Subcutaneous TID WC  . insulin glargine  3 Units Subcutaneous QHS  . midodrine  10 mg Oral Q M,W,F-HD  . multivitamin  1 tablet Oral QHS  . pantoprazole  40 mg Oral BID AC  . PARoxetine  10 mg Oral Daily  . polyethylene glycol  17 g Oral Daily  . simvastatin  40 mg Oral q1800  . sucralfate  1 g Oral TID WC & HS  . thiamine  100 mg Oral Daily  . warfarin  5 mg Oral ONCE-1800  . Warfarin - Pharmacist Dosing Inpatient   Does not apply q1800   Dialysis Orders: MWF -[facility] 4h  98.5kg  3k?2.25 bath  L thigh AVG  Hep 3000  Hectorol 68mcg IV qHD TIW Venofer 100mg  IV qHD mircera 171mcg  IV q2wks - no longer ordered, last 1/11 hgb 12 (was on 225 prior to that)    Assessment/ Plan:   1. Left leg cellulitis/abscess- below AVG s/p drainage, wound culture + enterococcus faecalis.  Blood cx no growth to date.  On Unasyn and will transition to po amoxicillin when ready for discharge per Dr. Trula Slade.  2. ESRD continue with HD qMWF.   3. Vascular access- using left thigh AVG (only cannulating the lateral side). 4. Anemia: restarted Aranesp 48mcg, no iron due to active infection for now 5. CKD-MBD: phos improved off of binders (was low at 2.1) but may need to restart at lower dose. 6. Nutrition: renal diet 7. Hypertension: stable 8. H/o DVT/protein C deficiency- lifelong anticoagulation.  Dosing per pharmacy 9. GERD- on carafate but should limit use due to aluminum content.  Donetta Potts, MD [place] Pager [phone] 07/18/2017, 12:43 PM

## 2017-07-18 NOTE — Progress Notes (Signed)
Physical Therapy Treatment Patient Details Name: Ashlee Miles MRN: [id number] DOB: 23-Sep-1940 Today's Date: 07/18/2017    History of Present Illness 77yo female with chronic wound vac in place L medial thigh, presenting to the ED wit increased L LE erythema and pain L upper leg. Received I&D L thigh 07/14/17. PMH HTN, GERD, protein C deficiency, pituitary adenoma, hx DVT on coumadin, ESRD on dialysis, CHF, DM, chronic wound vac placement, IVC filter, hx thrombectomy with AV Goretex graft     PT Comments    Pt is making progress toward her goals, however she is limited in her mobility by L sided pain. Pt is currently minA for transfers and modA for ambulation of 10 feet with platform walker. Pt able to participate in LE exercises once seated back in the recliner. PT will continue to follow until d/c.     Follow Up Recommendations  Home health PT;Supervision/Assistance - 24 hour     Equipment Recommendations  Other (comment)(possible L platform walker pending any updates to WB status L UE )    Recommendations for Other Services       Precautions / Restrictions Precautions Precautions: Fall Precaution Comments: per most recent ortho note, NWB L LE but can use platform walker  Restrictions Weight Bearing Restrictions: Yes LUE Weight Bearing: Non weight bearing Other Position/Activity Restrictions: can use platform walker     Mobility  Bed Mobility               General bed mobility comments: OOB in recliner  Transfers Overall transfer level: Needs assistance Equipment used: Left platform walker Transfers: Sit to/from Stand Sit to Stand: Min assist         General transfer comment: minA for power up and steadying with RW  Ambulation/Gait Ambulation/Gait assistance: Min guard Ambulation Distance (Feet): 10 Feet Assistive device: Left platform walker Gait Pattern/deviations: Step-through pattern;Decreased step length - right;Decreased step length - left;Trunk  flexed Gait velocity: slowed Gait velocity interpretation: Below normal speed for age/gender General Gait Details: limited gait due to increase in L LE pain with ambulation       Balance Overall balance assessment: Needs assistance Sitting-balance support: Feet supported;Single extremity supported Sitting balance-[name]: Good     Standing balance support: Bilateral upper extremity supported;During functional activity Standing balance-[name]: Fair                              Cognition Arousal/Alertness: Awake/alert Behavior During Therapy: WFL for tasks assessed/performed Overall Cognitive Status: Within Functional Limits for tasks assessed                                        Exercises General Exercises - Lower Extremity Gluteal Sets: AROM;Both;10 reps;Seated Long Arc Quad: AROM;Both;10 reps;Seated Hip Flexion/Marching: AROM;Both;10 reps;Seated Toe Raises: AROM;Both;10 reps;Seated Heel Raises: AROM;Both;10 reps;Seated        Pertinent Vitals/Pain Pain Assessment: 0-10 Pain Score: 7  Pain Location: L Hip and LE  Pain Descriptors / Indicators: Discomfort;Grimacing;Sore;Tender Pain Intervention(s): Limited activity within patient's tolerance;Monitored during session           PT Goals (current goals can now be found in the care plan section) Acute Rehab PT Goals Patient Stated Goal: to get better PT Goal Formulation: With patient Potential to Achieve Goals: Good Progress towards PT goals: Progressing toward goals    Frequency  Min 3X/week      PT Plan Current plan remains appropriate       AM-PAC PT "6 Clicks" Daily Activity  Outcome Measure  Difficulty turning over in bed (including adjusting bedclothes, sheets and blankets)?: Unable Difficulty moving from lying on back to sitting on the side of the bed? : Unable Difficulty sitting down on and standing up from a chair with arms (e.g., wheelchair, bedside  commode, etc,.)?: Unable Help needed moving to and from a bed to chair (including a wheelchair)?: A Little Help needed walking in hospital room?: A Little Help needed climbing 3-5 steps with a railing? : A Lot 6 Click Score: 11    End of Session Equipment Utilized During Treatment: Gait belt;Other (comment)(brace L wrist ) Activity Tolerance: Patient limited by fatigue Patient left: in chair;with chair alarm set;with call bell/phone within reach Nurse Communication: Mobility status PT Visit Diagnosis: Muscle weakness (generalized) (M62.81);Difficulty in walking, not elsewhere classified (R26.2);Unsteadiness on feet (R26.81);History of falling (Z91.81) Pain - Right/Left: Left Pain - part of body: Leg     Time: 4008-6761 PT Time Calculation (min) (ACUTE ONLY): 14 min  Charges:  $Therapeutic Exercise: 8-22 mins                    G Codes:       Ashlee Miles Dk PT, DPT Acute Rehabilitation  [phone] Pager [phone]     [place] 07/18/2017, 5:08 PM

## 2017-07-18 NOTE — Progress Notes (Signed)
[facility] PROGRESS NOTE  Ashlee Miles OAC:[id number] DOB: 1940-08-01 DOA: 07/11/2017  PCP: Darlina Rumpf, MD  Brief History/Interval Summary: 77 year old female with a past medical history of end-stage renal disease on hemodialysis, hypertension, hyperlipidemia, protein C deficiency, history of pituitary adenoma, history of DVT on warfarin, type 2 diabetes, wound VAC in the left lower extremity presented with increasing pain and erythema of the left upper leg.  Patient recently underwent left thigh graft thrombectomy and had a wound with wound VAC.  Patient was hospitalized for further management.  CT scan shows fluid collection in the left lower extremity.  Patient was taken to the OR on 1/25 for drainage.  Cultures positive for enterococcus.  Antibiotics changed to have discussions with infectious disease.  Currently on Unasyn.  Reason for Visit: Cellulitis and abscess, left leg  Consultants: Vascular surgery.  Nephrology.  Procedures:  1.  Incision and drainage of abscess left thigh 2.  Excisional debridement of wound left thigh (subcutaneous tissue)  Antibiotics: Vancomycin and Zosyn Changed to Unasyn  Subjective/Interval History: Patient feels well.  Pain is reasonably well controlled with pain medications.  Denies any new complaints.     ROS: No nausea or vomiting  Objective:  Vital Signs  Vitals:   07/17/17 1142 07/17/17 1300 07/17/17 2157 07/18/17 0635  BP: (!) 134/41 (!) 131/32 (!) 116/25 (!) 110/23  Pulse: 65 65 72 69  Resp: 13 14 18 18   Temp: (!) 97.5 F (36.4 C) 98.8 F (37.1 C) 98 F (36.7 C) 97.7 F (36.5 C)  TempSrc: Oral Oral Oral Oral  SpO2: 99% 100% 100% 100%  Weight: 97.5 kg (214 lb 15.2 oz)  98.8 kg (217 lb 13 oz)   Height:        Intake/Output Summary (Last 24 hours) at 07/18/2017 1012 Last data filed at 07/18/2017 0725 Gross per 24 hour  Intake 200 ml  Output 1822 ml  Net -1622 ml   Filed Weights   07/17/17 0735 07/17/17 1142  07/17/17 2157  Weight: 99.3 kg (218 lb 14.7 oz) 97.5 kg (214 lb 15.2 oz) 98.8 kg (217 lb 13 oz)    General appearance: Awake alert.  In no distress. Resp: Clear to auscultation bilaterally.  No wheezing rales or rhonchi. Cardio: S1-S2 is normal regular.  No S3-S4.  No rubs murmurs or bruit GI: Abdomen is soft.  Nontender nondistended.  Bowel sounds are present.  No masses or organomegaly Extremities: Left lower extremity covered in dressing. Neurologic: No obvious focal neurological deficits.  Lab Results:  Data Reviewed: I have personally reviewed following labs and imaging studies  CBC: Recent Labs  Lab 07/11/17 2227  07/14/17 1740 07/15/17 0404 07/16/17 0406 07/17/17 0359 07/18/17 0418  WBC 13.7*   < > 7.1 6.9 6.1 8.0 8.5  NEUTROABS 10.7*  --   --   --   --   --   --   HGB 11.0*   < > 10.7* 9.9* 9.8* 10.2* 10.4*  HCT 34.4*   < > 33.9* 31.3* 31.1* 31.9* 32.1*  MCV 82.9   < > 81.9 82.2 81.6 81.4 82.1  PLT 154   < > 175 149* 151 202 202   < > = values in this interval not displayed.    Basic Metabolic Panel: Recent Labs  Lab 07/12/17 0523 07/14/17 0447 07/14/17 1740 07/17/17 0822 07/18/17 0418  NA 137 136 135 132* 135  K 4.2 3.7 3.3* 3.9 3.9  CL 99* 96* 96* 94* 98*  CO2 25  25 28 23 25   GLUCOSE 86 124* 187* 115* 99  BUN 41* 31* 7 38* 23*  CREATININE 7.74* 5.72* 2.71* 7.49* 5.44*  CALCIUM 8.0* 7.8* 7.4* 7.6* 7.4*  PHOS  --   --  2.1* 4.8* 3.4    GFR: Estimated Creatinine Clearance: 9.7 mL/min (A) (by C-G formula based on SCr of 5.44 mg/dL (H)).  Coagulation Profile: Recent Labs  Lab 07/15/17 0404 07/16/17 0406 07/17/17 0359 07/17/17 1638 07/18/17 0418  INR 1.60 1.56 2.00 2.12 2.25   CBG: Recent Labs  Lab 07/16/17 2226 07/17/17 1302 07/17/17 1703 07/17/17 2156 07/18/17 0817  GLUCAP 102* 158* 167* 94 86   Results for orders placed or performed during the hospital encounter of 07/11/17  Culture, blood (routine x 2)     Status: None   Collection  Time: 07/12/17 10:40 AM  Result Value Ref Range Status   Specimen Description BLOOD RIGHT ARM  Final   Special Requests   Final    BOTTLES DRAWN AEROBIC AND ANAEROBIC Blood Culture adequate volume   Culture NO GROWTH 5 DAYS  Final   Report Status 07/17/2017 FINAL  Final  Culture, blood (routine x 2)     Status: None   Collection Time: 07/12/17 10:50 AM  Result Value Ref Range Status   Specimen Description BLOOD RIGHT HAND  Final   Special Requests   Final    BOTTLES DRAWN AEROBIC AND ANAEROBIC Blood Culture adequate volume   Culture NO GROWTH 5 DAYS  Final   Report Status 07/17/2017 FINAL  Final  MRSA PCR Screening     Status: None   Collection Time: 07/13/17  9:29 AM  Result Value Ref Range Status   MRSA by PCR NEGATIVE NEGATIVE Final    Comment:        The GeneXpert MRSA Assay (FDA approved for NASAL specimens only), is one component of a comprehensive MRSA colonization surveillance program. It is not intended to diagnose MRSA infection nor to guide or monitor treatment for MRSA infections.   Aerobic/Anaerobic Culture (surgical/deep wound)     Status: None (Preliminary result)   Collection Time: 07/14/17  8:11 AM  Result Value Ref Range Status   Specimen Description ABSCESS LEFT UPPER THIGH  Final   Special Requests SWAB SPEC A  Final   Gram Stain   Final    MODERATE WBC PRESENT, PREDOMINANTLY PMN NO ORGANISMS SEEN    Culture   Final    FEW ENTEROCOCCUS FAECALIS NO ANAEROBES ISOLATED; CULTURE IN PROGRESS FOR 5 DAYS    Report Status PENDING  Incomplete   Organism ID, Bacteria ENTEROCOCCUS FAECALIS  Final      Susceptibility   Enterococcus faecalis - MIC*    AMPICILLIN <=2 SENSITIVE Sensitive     VANCOMYCIN 2 SENSITIVE Sensitive     GENTAMICIN SYNERGY SENSITIVE Sensitive     * FEW ENTEROCOCCUS FAECALIS    Radiology Studies: Dg Wrist Complete Left  Result Date: 07/17/2017 CLINICAL DATA:  Fracture follow-up. EXAM: LEFT WRIST - COMPLETE 3+ VIEW COMPARISON:  Plain  films of the left wrist dated 06/15/2017 and 04/22/2017. FINDINGS: Distal radius fracture is stable in alignment. Probable early callus formation, however, fracture lines are still clearly visible. No new osseous abnormality. IMPRESSION: Stable alignment/configuration of the distal radius fracture. Probable early callus formation but fracture lines are still clearly visible. Electronically Signed   By: Franki Cabot M.D.   On: 07/17/2017 21:48     Medications:  Scheduled: . carvedilol  3.125 mg Oral  BID  . cinacalcet  30 mg Oral Q supper  . collagenase   Topical BID  . darbepoetin (ARANESP) injection - DIALYSIS  60 mcg Intravenous Q Mon-HD  . docusate sodium  100 mg Oral BID  . doxercalciferol  6 mcg Intravenous Q M,W,F-HD  . famotidine  20 mg Oral Daily  . feeding supplement (PRO-STAT SUGAR FREE 64)  30 mL Oral BID  . gabapentin  300 mg Oral QHS  . insulin aspart  0-9 Units Subcutaneous TID WC  . insulin glargine  3 Units Subcutaneous QHS  . midodrine  10 mg Oral Q M,W,F-HD  . multivitamin  1 tablet Oral QHS  . pantoprazole  40 mg Oral BID AC  . PARoxetine  10 mg Oral Daily  . polyethylene glycol  17 g Oral Daily  . simvastatin  40 mg Oral q1800  . sucralfate  1 g Oral TID WC & HS  . thiamine  100 mg Oral Daily  . warfarin  5 mg Oral ONCE-1800  . Warfarin - Pharmacist Dosing Inpatient   Does not apply q1800   Continuous: . ampicillin-sulbactam (UNASYN) IV Stopped (07/17/17 2251)   VZC:HYIFOYDXAJOIN **OR** acetaminophen, hydrALAZINE, ipratropium-albuterol, linaclotide, ondansetron **OR** ondansetron (ZOFRAN) IV, oxyCODONE-acetaminophen, phenol, senna-docusate, sodium chloride flush, triamcinolone cream, zolpidem  Assessment/Plan:  Principal Problem:   Cellulitis of left lower extremity Active Problems:   Essential hypertension   DVT (deep venous thrombosis) (HCC)   ESRD on dialysis ([place])   Anemia due to chronic kidney disease   Diabetes mellitus with end stage renal disease  (HCC)   GERD (gastroesophageal reflux disease)   Protein C deficiency (HCC)   Leg wound, left   HLD (hyperlipidemia)    Cellulitis and possible abscess of the left lower extremity in the setting of chronic wound and recent surgery Patient underwent vascular surgery recently to that area.  She had a wound VAC in place.  CT scan raised concern for fluid collection.  Vascular surgery was consulted.  Patient was placed on vancomycin and Zosyn.  Patient taken to the operating room on 1/25 for surgical drainage.  Lactic acid level was normal.  Pro-calcitonin 8.26.  WBC was noted to be elevated and has been normal subsequently.  Cultures from the drain fluid positive for enterococcus.  Discussed with infectious disease and vascular surgery.  She was changed over to Unasyn.  MRSA PCR has been negative previously.  Blood cultures have been without any growth.  Wound care per vascular surgery.  Once wound is thought to be stable then she could be discharged on oral amoxicillin.  Apparently the infection does not appear to have affected the graft.    History of DVT and protein C deficiency She was on warfarin which was reversed for surgery.  Patient was placed on IV heparin.  Warfarin has been resumed.  INR is therapeutic.  IV heparin was discontinued.  Pharmacy is managing.  History of essential hypertension Blood pressure was noted to be low at hemodialysis.  Appears to have stabilized.  Dose of carvedilol was reduced.  Blood pressures have stabilized.  Continue to monitor.  She is also noted to be on midodrine.    End-stage renal disease on hemodialysis (MWF) Nephrology is following.  She is being dialyzed as per her usual schedule.  Anemia of chronic kidney disease Hemoglobin stable.  No evidence of overt bleeding.  Continue to monitor.  History of diabetes mellitus type 2 in the setting of end-stage renal disease Continue Lantus.  CBGs are  reasonably well controlled.  History of GERD Continue  home medications.  Comminuted fracture left radius Seen by [facility] previously.  Plan was to follow-up at the end of this month.  X-ray was done which shows stable alignment.  She can follow-up with [facility] after discharge.    DVT Prophylaxis: On Warfarin Code Status: DNR Family Communication: Discussed with the patient Disposition Plan: PT recommends home health with the 24-hour supervision.  Discussed with patient's daughter.  She herself recently returned home from a skilled nursing facility and will not be able to provide assistance.  So patient will most likely need to go to skilled nursing facility for short-term rehab when ready from medical standpoint.    LOS: 6 days   [facility] Pager [phone] 07/18/2017, 10:12 AM  If 7PM-7AM, please contact night-coverage at [email or web address], password [email or web address]

## 2017-07-18 NOTE — Progress Notes (Signed)
ANTICOAGULATION CONSULT NOTE - Follow Up Consult  Pharmacy Consult for warfarin Indication: history of DVT, protein C deficiency  No Active Allergies  Patient Measurements: Height: 5\' 2"  (157.5 cm) Weight: 217 lb 13 oz (98.8 kg) IBW/kg (Calculated) : 50.1 Heparin Dosing Weight: 73 kg  Labs: Recent Labs    07/16/17 0406 07/16/17 1400 07/17/17 0359 07/17/17 0822 07/17/17 1638 07/18/17 0418  HGB 9.8*  --  10.2*  --   --  10.4*  HCT 31.1*  --  31.9*  --   --  32.1*  PLT 151  --  202  --   --  202  LABPROT 18.5*  --  22.5*  --  23.6* 24.7*  INR 1.56  --  2.00  --  2.12 2.25  HEPARINUNFRC <0.10* 0.61 1.08*  --   --   --   CREATININE  --   --   --  7.49*  --  5.44*    Estimated Creatinine Clearance: 9.7 mL/min (A) (by C-G formula based on SCr of 5.44 mg/dL (H)).   Assessment: 77 yo F on Coumadin PTA for hx of DVT and protein C deficiency.  Patient was admitted with LLE cellulitis> CT scan noted fluid collection on LLE concerning for abscess / infection with plans for drainage, so INR was reversed for procedure and IV heparin initiated. Now patient is s/p I&D on 1/25 with heparin and warfarin resumed post-op.    Heparin now stopped INR today = 2.25  Home dose of warfarin was 5mg  daily.  Goal of Therapy:  INR = 2 to 3 Monitor platelets by anticoagulation protocol: Yes   Plan:  Warfarin 5 mg po x 1 tonight Daily INR  Thank you Anette Guarneri, PharmD [phone] 07/18/2017 8:39 AM

## 2017-07-18 NOTE — Progress Notes (Signed)
Pt would like to have something to stop her multiple loose stools. Pt have had multiple laxatives previously. NP on call was notified. Will continue to monitor.

## 2017-07-18 NOTE — Care Management Important Message (Signed)
Important Message  Patient Details  Name: Ashlee Miles MRN: [id number] Date of Birth: 06/18/1941   Medicare Important Message Given:  Yes    Veneda Kirksey Montine Circle 07/18/2017, 1:04 PM

## 2017-07-18 NOTE — Progress Notes (Signed)
[facility]  Subjective  - Doing well today without new complaints.  The left thigh feels a little better, but still sore.   Objective (!) 135/41 68 97.7 F (36.5 C) (Oral) 18 100%  Intake/Output Summary (Last 24 hours) at 07/18/2017 1318 Last data filed at 07/18/2017 0725 Gross per 24 hour  Intake 200 ml  Output 2 ml  Net 198 ml    Upper thigh wound with nice beefy red base, lateral tract 2 cm, medial tract 1 cm Wound superior to knee with beefy red base lateral trac 2 cm Thigh softer to palpation without drainage at either wound site. Wet to dry packing placed   Assessment/Planning:  POD # 4 irrigation and debridement of above knee abscess Cultures grew ENTEROCOCCUS FAECALIS now on IV Unasyn per sensitives.  Plan at discharge will be PO amoxicillin.   Possible wound vac placement prior to discharge  Roxy Horseman 07/18/2017 1:18 PM --  Laboratory Lab Results: Recent Labs    07/17/17 0359 07/18/17 0418  WBC 8.0 8.5  HGB 10.2* 10.4*  HCT 31.9* 32.1*  PLT 202 202   BMET Recent Labs    07/17/17 0822 07/18/17 0418  NA 132* 135  K 3.9 3.9  CL 94* 98*  CO2 23 25  GLUCOSE 115* 99  BUN 38* 23*  CREATININE 7.49* 5.44*  CALCIUM 7.6* 7.4*    COAG Lab Results  Component Value Date   INR 2.25 07/18/2017   INR 2.12 07/17/2017   INR 2.00 07/17/2017   No results found for: PTT

## 2017-07-18 NOTE — Evaluation (Signed)
Occupational Therapy Evaluation Patient Details Name: Ashlee Miles MRN: [id number] DOB: 09-08-1940 Today's Date: 07/18/2017    History of Present Illness 77yo female with chronic wound vac in place L medial thigh, presenting to the ED wit increased L LE erythema and pain L upper leg. Received I&D L thigh 07/14/17. PMH HTN, GERD, protein C deficiency, pituitary adenoma, hx DVT on coumadin, ESRD on dialysis, CHF, DM, chronic wound vac placement, IVC filter, hx thrombectomy with AV Goretex graft    Clinical Impression   Pt with decline in function and safety with ADLs and ADL mobility with decreased strength, balance and endurance. Pt with impaired L UE functional use due to pain in L wrist (xray pending) and wearing wrist/forearm support brace. Pt would benefit from acute OT services to address impairments to maximize level of function and safety to return home    Follow Up Recommendations  Home health OT;Supervision/Assistance - 24 hour    Equipment Recommendations  None recommended by OT    Recommendations for Other Services       Precautions / Restrictions Precautions Precautions: Fall Precaution Comments: per most recent ortho note, NWB L UE but can use platform walker  Restrictions Weight Bearing Restrictions: No LUE Weight Bearing: Non weight bearing Other Position/Activity Restrictions: can use platform walker       Mobility Bed Mobility Overal bed mobility: Needs Assistance Bed Mobility: Supine to Sit     Supine to sit: Min guard;HOB elevated        Transfers Overall transfer level: Needs assistance Equipment used: Left platform walker Transfers: Sit to/from Stand Sit to Stand: Mod assist Stand pivot transfers: Mod assist       General transfer comment: mod assist to power up to full standing position, Mod cues to correctly use L platform walker     Balance Overall balance assessment: Needs assistance Sitting-balance support: Feet supported;Single  extremity supported Sitting balance-[name]: Good     Standing balance support: Bilateral upper extremity supported;During functional activity Standing balance-[name]: Fair                             ADL either performed or assessed with clinical judgement   ADL Overall ADL's : Needs assistance/impaired Eating/Feeding: Set up;Sitting   Grooming: Wash/dry hands;Wash/dry face;Set up;Supervision/safety;Sitting   Upper Body Bathing: Sitting;Set up;Supervision/ safety   Lower Body Bathing: Moderate assistance   Upper Body Dressing : Set up;Supervision/safety;Sitting   Lower Body Dressing: Moderate assistance   Toilet Transfer: Moderate assistance;Stand-pivot;RW;BSC   Toileting- Clothing Manipulation and Hygiene: Moderate assistance;Sit to/from stand       Functional mobility during ADLs: Moderate assistance;Rolling walker;Cueing for sequencing       Vision Baseline Vision/History: Wears glasses Wears Glasses: Reading only Patient Visual Report: No change from baseline       Perception     Praxis      Pertinent Vitals/Pain Pain Assessment: 0-10 Pain Score: 6  Pain Location: L LE, L wrist Pain Intervention(s): Monitored during session     Hand Dominance Right   Extremity/Trunk Assessment Upper Extremity Assessment Upper Extremity Assessment: Generalized weakness   Lower Extremity Assessment Lower Extremity Assessment: Defer to PT evaluation       Communication Communication Communication: No difficulties   Cognition Arousal/Alertness: Awake/alert Behavior During Therapy: WFL for tasks assessed/performed Overall Cognitive Status: Within Functional Limits for tasks assessed  General Comments       Exercises     Shoulder Instructions      Home Living Family/patient expects to be discharged to:: Private residence Living Arrangements: Children Available Help at Discharge:  Family;Personal care attendant Type of Home: House Home Access: Stairs to enter [employer] of Steps: 3 Entrance Stairs-Rails: Right;Left Home Layout: One level     Bathroom Shower/Tub: Corporate investment banker: Standard   How Accessible: Accessible via walker Home Equipment: Toilet riser;Walker - 4 wheels;Shower seat          Prior Functioning/Environment Level of Independence: Independent  Gait / Transfers Assistance Needed: using RW for mobility ADL's / Homemaking Assistance Needed: bathing and dressing by PCA            OT Problem List: Decreased strength;Decreased range of motion;Decreased activity tolerance;Impaired balance (sitting and/or standing);Decreased safety awareness;Decreased knowledge of use of DME or AE;Decreased knowledge of precautions;Obesity;Pain;Impaired UE functional use      OT Treatment/Interventions: Self-care/ADL training;DME and/or AE instruction;Patient/family education;Therapeutic activities    OT Goals(Current goals can be found in the care plan section) Acute Rehab OT Goals Patient Stated Goal: to get better OT Goal Formulation: With patient Time For Goal Achievement: 08/01/17 Potential to Achieve Goals: Fair ADL Goals Pt Will Perform Grooming: with supervision;with set-up;standing Pt Will Perform Upper Body Bathing: with set-up;sitting Pt Will Perform Lower Body Bathing: with min assist;with caregiver independent in assisting Pt Will Perform Upper Body Dressing: with set-up;sitting Pt Will Perform Lower Body Dressing: with min assist;with caregiver independent in assisting Pt Will Transfer to Toilet: with min assist;stand pivot transfer;ambulating;regular height toilet;bedside commode Pt Will Perform Toileting - Clothing Manipulation and hygiene: with min assist;sit to/from stand;with caregiver independent in assisting  OT Frequency: Min 2X/week   Barriers to D/C:    no barriers       Co-evaluation               AM-PAC PT "6 Clicks" Daily Activity     Outcome Measure Help from another person eating meals?: None Help from another person taking care of personal grooming?: A Little Help from another person toileting, which includes using toliet, bedpan, or urinal?: A Lot Help from another person bathing (including washing, rinsing, drying)?: A Little Help from another person to put on and taking off regular upper body clothing?: A Little Help from another person to put on and taking off regular lower body clothing?: A Lot 6 Click Score: 17   End of Session Equipment Utilized During Treatment: Gait belt;Rolling walker  Activity Tolerance: Patient tolerated treatment well Patient left: in chair;with call bell/phone within reach;with chair alarm set  OT Visit Diagnosis: Unsteadiness on feet (R26.81);Other abnormalities of gait and mobility (R26.89);History of falling (Z91.81);Pain Pain - Right/Left: Left Pain - part of body: Leg;Hand                Time: 8003-4917 OT Time Calculation (min): 24 min Charges:  OT General Charges $OT Visit: 1 Visit OT Evaluation $OT Eval Moderate Complexity: 1 Mod OT Treatments $Therapeutic Activity: 8-22 mins G-Codes: OT G-codes **NOT FOR INPATIENT CLASS** Functional Assessment Tool Used: AM-PAC 6 Clicks Daily Activity     Britt Bottom 07/18/2017, 11:02 AM

## 2017-07-19 ENCOUNTER — Telehealth (INDEPENDENT_AMBULATORY_CARE_PROVIDER_SITE_OTHER): Payer: Self-pay | Admitting: Orthopedic Surgery

## 2017-07-19 ENCOUNTER — Inpatient Hospital Stay (HOSPITAL_COMMUNITY): Payer: 59

## 2017-07-19 LAB — CBC
HCT: 32.6 % — ABNORMAL LOW (ref 36.0–46.0)
Hemoglobin: 10.4 g/dL — ABNORMAL LOW (ref 12.0–15.0)
MCH: 25.9 pg — ABNORMAL LOW (ref 26.0–34.0)
MCHC: 31.9 g/dL (ref 30.0–36.0)
MCV: 81.1 fL (ref 78.0–100.0)
Platelets: 293 10*3/uL (ref 150–400)
RBC: 4.02 MIL/uL (ref 3.87–5.11)
RDW: 17.4 % — ABNORMAL HIGH (ref 11.5–15.5)
WBC: 7.1 10*3/uL (ref 4.0–10.5)

## 2017-07-19 LAB — GLUCOSE, CAPILLARY
Glucose-Capillary: 118 mg/dL — ABNORMAL HIGH (ref 65–99)
Glucose-Capillary: 238 mg/dL — ABNORMAL HIGH (ref 65–99)
Glucose-Capillary: 190 mg/dL — ABNORMAL HIGH (ref 65–99)

## 2017-07-19 LAB — AEROBIC/ANAEROBIC CULTURE W GRAM STAIN (SURGICAL/DEEP WOUND)

## 2017-07-19 LAB — RENAL FUNCTION PANEL
Albumin: 2.8 g/dL — ABNORMAL LOW (ref 3.5–5.0)
Anion gap: 15 (ref 5–15)
BUN: 33 mg/dL — ABNORMAL HIGH (ref 6–20)
CO2: 23 mmol/L (ref 22–32)
Calcium: 7.8 mg/dL — ABNORMAL LOW (ref 8.9–10.3)
Chloride: 94 mmol/L — ABNORMAL LOW (ref 101–111)
Creatinine, Ser: 7.66 mg/dL — ABNORMAL HIGH (ref 0.44–1.00)
GFR calc Af Amer: 5 mL/min — ABNORMAL LOW (ref >60)
GFR calc non Af Amer: 5 mL/min — ABNORMAL LOW (ref >60)
Glucose, Bld: 168 mg/dL — ABNORMAL HIGH (ref 65–99)
Phosphorus: 5.1 mg/dL — ABNORMAL HIGH (ref 2.5–4.6)
Potassium: 3.5 mmol/L (ref 3.5–5.1)
Sodium: 132 mmol/L — ABNORMAL LOW (ref 135–145)

## 2017-07-19 LAB — HEPATITIS B SURFACE ANTIGEN: Hepatitis B Surface Ag: NEGATIVE

## 2017-07-19 LAB — PROTIME-INR
INR: 2.66
Prothrombin Time: 28.1 seconds — ABNORMAL HIGH (ref 11.4–15.2)

## 2017-07-19 MED ORDER — WARFARIN SODIUM 5 MG PO TABS
5.0000 mg | ORAL_TABLET | Freq: Once | ORAL | Status: AC
  Administered 2017-07-19: 5 mg via ORAL
  Filled 2017-07-19: qty 1

## 2017-07-19 MED ORDER — DOXERCALCIFEROL 4 MCG/2ML IV SOLN
INTRAVENOUS | Status: AC
  Administered 2017-07-19: 18:00:00
  Filled 2017-07-19: qty 4

## 2017-07-19 MED ORDER — GABAPENTIN 100 MG PO CAPS
100.0000 mg | ORAL_CAPSULE | Freq: Every day | ORAL | Status: DC
  Administered 2017-07-19 – 2017-07-20 (×2): 100 mg via ORAL
  Filled 2017-07-19 (×2): qty 1

## 2017-07-19 NOTE — Clinical Social Work Note (Signed)
Clinical Social Work Assessment  Patient Details  Name: Ashlee Miles MRN: [id number] Date of Birth: February 17, 1941  Date of referral:  07/19/17               Reason for consult:  Facility Placement                Permission sought to share information with:  Facility Sport and exercise psychologist, Family Supports Permission granted to share information::  Yes, Verbal Permission Granted  Name::     Diplomatic Services operational officer::  SNFs  Relationship::  Daughter  Contact Information:  [phone]  Housing/Transportation Living arrangements for the past 2 months:  [place] of Information:  Patient, Adult Children Patient Interpreter Needed:  None Criminal Activity/Legal Involvement Pertinent to Current Situation/Hospitalization:  No - Comment as needed Significant Relationships:  Adult Children Lives with:  Adult Children Do you feel safe going back to the place where you live?  No Need for family participation in patient care:  Yes (Comment)  Care giving concerns:  CSW received consult for possible SNF placement at time of discharge. CSW spoke with patient's daughter regarding PT recommendation of SNF placement at time of discharge. Patient's daughter reported that patient lives with her and she is currently unable to care for patient at their home given patient's current physical needs and fall risk. Patient expressed understanding of PT recommendation and is agreeable to SNF placement at time of discharge. CSW to continue to follow and assist with discharge planning needs.   Social Worker assessment / plan:  CSW spoke with patient's daughter concerning possibility of rehab at [facility] before returning home.  Employment status:  Retired Nurse, adult PT Recommendations:  [place] / Referral to community resources:  [place]  Patient/Family's Response to care:  Patient's daughter recognizes need for rehab before returning  home and is agreeable to a SNF in [place]. Patient's daughter reported preference for [facility] since she has been there before. Patient has [facility] for dialysis transport. Patient's daughter expressed unhappiness with the patient's personal care service company because they did not have enough people to come out and help the patient at home.   Patient/Family's Understanding of and Emotional Response to Diagnosis, Current Treatment, and Prognosis:  Patient/family is realistic regarding therapy needs and expressed being hopeful for SNF placement. Patient expressed understanding of CSW role and discharge process as well as medical condition. No questions/concerns about plan or treatment.    Emotional Assessment Appearance:  Appears stated age Attitude/Demeanor/Rapport:  Other(Appropriate) Affect (typically observed):  Accepting, Appropriate Orientation:  Oriented to Self, Oriented to Situation, Oriented to Place, Oriented to  Time Alcohol / Substance use:  Not Applicable Psych involvement (Current and /or in the community):  No (Comment)  Discharge Needs  Concerns to be addressed:  Care Coordination Readmission within the last 30 days:  Yes Current discharge risk:  None Barriers to Discharge:  Continued Medical Work up   [employer], [place] 07/19/2017, 11:45 AM

## 2017-07-19 NOTE — Progress Notes (Signed)
ANTICOAGULATION CONSULT NOTE - Follow Up Consult  Pharmacy Consult for warfarin Indication: history of DVT, protein C deficiency  No Active Allergies  Patient Measurements: Height: 5\' 2"  (157.5 cm) Weight: 218 lb 0.6 oz (98.9 kg) IBW/kg (Calculated) : 50.1 Heparin Dosing Weight: 73 kg  Labs: Recent Labs    07/16/17 1400  07/17/17 0359 07/17/17 0822 07/17/17 1638 07/18/17 0418 07/19/17 0413  HGB  --   --  10.2*  --   --  10.4*  --   HCT  --   --  31.9*  --   --  32.1*  --   PLT  --   --  202  --   --  202  --   LABPROT  --    < > 22.5*  --  23.6* 24.7* 28.1*  INR  --    < > 2.00  --  2.12 2.25 2.66  HEPARINUNFRC 0.61  --  1.08*  --   --   --   --   CREATININE  --   --   --  7.49*  --  5.44*  --    < > = values in this interval not displayed.    Estimated Creatinine Clearance: 9.7 mL/min (A) (by C-G formula based on SCr of 5.44 mg/dL (H)).   Assessment: 77 yo F on Coumadin PTA for hx of DVT and protein C deficiency.  Patient was admitted with LLE cellulitis> CT scan noted fluid collection on LLE concerning for abscess / infection with plans for drainage, so INR was reversed for procedure and IV heparin initiated. Now patient is s/p I&D on 1/25 with heparin and warfarin resumed post-op.    Heparin now stopped INR today = 2.66  Home dose of warfarin was 5mg  daily.  Goal of Therapy:  INR = 2 to 3 Monitor platelets by anticoagulation protocol: Yes   Plan:  Repeat Warfarin 5 mg po x 1 tonight Daily INR  Thank you Anette Guarneri, PharmD [phone] 07/19/2017 10:17 AM

## 2017-07-19 NOTE — Procedures (Signed)
I have personally attended this patient's dialysis session.   Cannulating lateral side of thigh AVG UFG set at 2, only 1 kg over EDW so challenging weight a bit 3K bath pending labs  Jamal Maes, MD [place] Pager 07/19/2017, 11:37 AM

## 2017-07-19 NOTE — Progress Notes (Signed)
PROGRESS NOTE    Ashlee Miles  WCB:[id number] DOB: [birth year] DOA: 07/11/2017 PCP: Darlina Rumpf, MD     Brief Narrative:  Ashlee Miles is a 77 year old female with a past medical history of end-stage renal disease on hemodialysis, hypertension, hyperlipidemia, protein C deficiency, history of pituitary adenoma, history of DVT on warfarin, type 2 diabetes, wound VAC in the left lower extremity presented with increasing pain and erythema of the left upper leg.  Patient recently underwent left thigh graft thrombectomy and had a wound with wound VAC.  Patient was hospitalized for further management.  CT scan showed fluid collection in the left lower extremity.  Patient was taken to the OR on 1/25 for drainage.  Cultures positive for enterococcus.  Antibiotics changed after discussions with infectious disease.  Currently on Unasyn.  Assessment & Plan:   Principal Problem:   Cellulitis of left lower extremity Active Problems:   Essential hypertension   DVT (deep venous thrombosis) (HCC)   ESRD on dialysis ([place])   Anemia due to chronic kidney disease   Diabetes mellitus with end stage renal disease (HCC)   GERD (gastroesophageal reflux disease)   Protein C deficiency (HCC)   Leg wound, left   HLD (hyperlipidemia)   Cellulitis and possible abscess of the left lower extremity in the setting of chronic wound and recent surgery Patient underwent vascular surgery recently to that area.  She had a wound VAC in place.  CT scan raised concern for fluid collection.  Vascular surgery was consulted.  Patient was placed on vancomycin and Zosyn.  Patient taken to the operating room on 1/25 for surgical drainage.  Lactic acid level was normal.  Pro-calcitonin 8.26.  WBC was noted to be elevated and has been normal subsequently.  Cultures from the drain fluid positive for enterococcus.  Discussed with infectious disease and vascular surgery.  She was changed over to Unasyn.  MRSA PCR has been negative  previously.  Blood cultures have been without any growth.  Wound care per vascular surgery.  Once wound is thought to be stable then she could be discharged on oral amoxicillin. Await further vascular surgery plan prior to discharge.   History of DVT and protein C deficiency She was on warfarin which was reversed for surgery.  Patient was placed on IV heparin.  Warfarin has been resumed.   History of essential hypertension Blood pressure was noted to be low at hemodialysis.  Appears to have stabilized.  Dose of carvedilol was reduced.  Blood pressures have stabilized.  Continue to monitor.  She is also noted to be on midodrine with HD.    End-stage renal disease on hemodialysis (MWF) Nephrology is following.  She is being dialyzed as per her usual schedule.  Anemia of chronic kidney disease Hemoglobin stable.  No evidence of overt bleeding.  Continue to monitor.  History of diabetes mellitus type 2 in the setting of end-stage renal disease Continue Lantus.  CBGs are reasonably well controlled.  History of GERD Continue home medications.  Comminuted fracture left radius Seen by [facility] previously.  Plan was to follow-up at the end of this month.  X-ray was done which shows stable alignment.  She can follow-up with [facility] after discharge.      DVT prophylaxis: coumadin Code Status: DNR Family Communication: no family at bedside Disposition Plan: SNF when cleared by vascular surgery team    Consultants:   Vascular surgery  Nephrology  Procedures:  1.Incision and drainage of abscess left thigh 2.Excisional  debridement of wound left thigh (subcutaneous tissue)  Antimicrobials:  Anti-infectives (From admission, onward)   Start     Dose/Rate Route Frequency Ordered Stop   07/16/17 2000  Ampicillin-Sulbactam (UNASYN) 3 g in sodium chloride 0.9 % 100 mL IVPB     3 g 200 mL/hr over 30 Minutes Intravenous Every 24 hours 07/16/17 1252      07/14/17 1512  Vancomycin (VANCOCIN) 750-5 MG/150ML-% IVPB    Comments:  Herriott, Melisa   : cabinet override      07/14/17 1512 07/15/17 0314   07/13/17 0845  vancomycin (VANCOCIN) IVPB 750 mg/150 ml premix     750 mg 150 mL/hr over 60 Minutes Intravenous  Once 07/13/17 0841 07/13/17 1101   07/12/17 1200  vancomycin (VANCOCIN) IVPB 750 mg/150 ml premix  Status:  Discontinued     750 mg 150 mL/hr over 60 Minutes Intravenous Every M-W-F (Hemodialysis) 07/12/17 0037 07/16/17 1236   07/12/17 0830  piperacillin-tazobactam (ZOSYN) IVPB 3.375 g  Status:  Discontinued     3.375 g 12.5 mL/hr over 240 Minutes Intravenous Every 12 hours 07/12/17 0816 07/16/17 1236   07/12/17 0100  vancomycin (VANCOCIN) 1,500 mg in sodium chloride 0.9 % 500 mL IVPB     1,500 mg 250 mL/hr over 120 Minutes Intravenous  Once 07/12/17 0037 07/12/17 0331   07/11/17 2330  piperacillin-tazobactam (ZOSYN) IVPB 3.375 g  Status:  Discontinued    Comments:  Zosyn 3.375 g IV q12h in ESRD on HD   3.375 g 12.5 mL/hr over 240 Minutes Intravenous Every 12 hours 07/11/17 2325 07/12/17 0001       Subjective: Doing well, no complaints. Awaiting to be taken to HD this morning.   Objective: Vitals:   07/19/17 1230 07/19/17 1300 07/19/17 1330 07/19/17 1400  BP: (!) 115/39 (!) 118/56 (!) 121/54 (!) 107/45  Pulse: 65 65 67 70  Resp: 20 20 18 16   Temp:      TempSrc:      SpO2:      Weight:      Height:        Intake/Output Summary (Last 24 hours) at 07/19/2017 1442 Last data filed at 07/18/2017 1513 Gross per 24 hour  Intake 250 ml  Output -  Net 250 ml   Filed Weights   07/17/17 2157 07/19/17 0424 07/19/17 1130  Weight: 98.8 kg (217 lb 13 oz) 98.9 kg (218 lb 0.6 oz) 98.5 kg (217 lb 2.5 oz)    Examination:  General exam: Appears calm and comfortable  Respiratory system: Clear to auscultation. Respiratory effort normal. Cardiovascular system: S1 & S2 heard, RRR. No JVD, murmurs, rubs, gallops or clicks. No pedal  edema. Gastrointestinal system: Abdomen is nondistended, soft and nontender. No organomegaly or masses felt. Normal bowel sounds heard. Central nervous system: Alert and oriented. No focal neurological deficits. Extremities: Symmetric, +LLE AV fistula with palpable thrill  Skin: +left thigh with clean and dry dressing Psychiatry: Judgement and insight appear normal. Mood & affect appropriate.   Data Reviewed: I have personally reviewed following labs and imaging studies  CBC: Recent Labs  Lab 07/15/17 0404 07/16/17 0406 07/17/17 0359 07/18/17 0418 07/19/17 1153  WBC 6.9 6.1 8.0 8.5 7.1  HGB 9.9* 9.8* 10.2* 10.4* 10.4*  HCT 31.3* 31.1* 31.9* 32.1* 32.6*  MCV 82.2 81.6 81.4 82.1 81.1  PLT 149* 151 202 202 604   Basic Metabolic Panel: Recent Labs  Lab 07/14/17 0447 07/14/17 1740 07/17/17 0822 07/18/17 0418 07/19/17 1153  NA 136 135  132* 135 132*  K 3.7 3.3* 3.9 3.9 3.5  CL 96* 96* 94* 98* 94*  CO2 25 28 23 25 23   GLUCOSE 124* 187* 115* 99 168*  BUN 31* 7 38* 23* 33*  CREATININE 5.72* 2.71* 7.49* 5.44* 7.66*  CALCIUM 7.8* 7.4* 7.6* 7.4* 7.8*  PHOS  --  2.1* 4.8* 3.4 5.1*   GFR: Estimated Creatinine Clearance: 6.9 mL/min (A) (by C-G formula based on SCr of 7.66 mg/dL (H)). Liver Function Tests: Recent Labs  Lab 07/14/17 1740 07/17/17 0822 07/18/17 0418 07/19/17 1153  ALBUMIN 2.9* 2.6* 2.6* 2.8*   No results for input(s): LIPASE, AMYLASE in the last 168 hours. No results for input(s): AMMONIA in the last 168 hours. Coagulation Profile: Recent Labs  Lab 07/16/17 0406 07/17/17 0359 07/17/17 1638 07/18/17 0418 07/19/17 0413  INR 1.56 2.00 2.12 2.25 2.66   Cardiac Enzymes: No results for input(s): CKTOTAL, CKMB, CKMBINDEX, TROPONINI in the last 168 hours. BNP (last 3 results) No results for input(s): PROBNP in the last 8760 hours. HbA1C: No results for input(s): HGBA1C in the last 72 hours. CBG: Recent Labs  Lab 07/18/17 0817 07/18/17 1231  07/18/17 1643 07/18/17 2110 07/19/17 0749  GLUCAP 86 125* 216* 254* 118*   Lipid Profile: No results for input(s): CHOL, HDL, LDLCALC, TRIG, CHOLHDL, LDLDIRECT in the last 72 hours. Thyroid Function Tests: No results for input(s): TSH, T4TOTAL, FREET4, T3FREE, THYROIDAB in the last 72 hours. Anemia Panel: No results for input(s): VITAMINB12, FOLATE, FERRITIN, TIBC, IRON, RETICCTPCT in the last 72 hours. Sepsis Labs: No results for input(s): PROCALCITON, LATICACIDVEN in the last 168 hours.  Recent Results (from the past 240 hour(s))  Culture, blood (routine x 2)     Status: None   Collection Time: 07/12/17 10:40 AM  Result Value Ref Range Status   Specimen Description BLOOD RIGHT ARM  Final   Special Requests   Final    BOTTLES DRAWN AEROBIC AND ANAEROBIC Blood Culture adequate volume   Culture NO GROWTH 5 DAYS  Final   Report Status 07/17/2017 FINAL  Final  Culture, blood (routine x 2)     Status: None   Collection Time: 07/12/17 10:50 AM  Result Value Ref Range Status   Specimen Description BLOOD RIGHT HAND  Final   Special Requests   Final    BOTTLES DRAWN AEROBIC AND ANAEROBIC Blood Culture adequate volume   Culture NO GROWTH 5 DAYS  Final   Report Status 07/17/2017 FINAL  Final  MRSA PCR Screening     Status: None   Collection Time: 07/13/17  9:29 AM  Result Value Ref Range Status   MRSA by PCR NEGATIVE NEGATIVE Final    Comment:        The GeneXpert MRSA Assay (FDA approved for NASAL specimens only), is one component of a comprehensive MRSA colonization surveillance program. It is not intended to diagnose MRSA infection nor to guide or monitor treatment for MRSA infections.   Aerobic/Anaerobic Culture (surgical/deep wound)     Status: None   Collection Time: 07/14/17  8:11 AM  Result Value Ref Range Status   Specimen Description ABSCESS LEFT UPPER THIGH  Final   Special Requests SWAB SPEC A  Final   Gram Stain   Final    MODERATE WBC PRESENT, PREDOMINANTLY  PMN NO ORGANISMS SEEN    Culture FEW ENTEROCOCCUS FAECALIS NO ANAEROBES ISOLATED   Final   Report Status 07/19/2017 FINAL  Final   Organism ID, Bacteria ENTEROCOCCUS FAECALIS  Final  Susceptibility   Enterococcus faecalis - MIC*    AMPICILLIN <=2 SENSITIVE Sensitive     VANCOMYCIN 2 SENSITIVE Sensitive     GENTAMICIN SYNERGY SENSITIVE Sensitive     * FEW ENTEROCOCCUS FAECALIS       Radiology Studies: Dg Wrist Complete Left  Result Date: 07/17/2017 CLINICAL DATA:  Fracture follow-up. EXAM: LEFT WRIST - COMPLETE 3+ VIEW COMPARISON:  Plain films of the left wrist dated 06/15/2017 and 04/22/2017. FINDINGS: Distal radius fracture is stable in alignment. Probable early callus formation, however, fracture lines are still clearly visible. No new osseous abnormality. IMPRESSION: Stable alignment/configuration of the distal radius fracture. Probable early callus formation but fracture lines are still clearly visible. Electronically Signed   By: Franki Cabot M.D.   On: 07/17/2017 21:48      Scheduled Meds: . carvedilol  3.125 mg Oral BID  . cinacalcet  30 mg Oral Q supper  . collagenase   Topical BID  . darbepoetin (ARANESP) injection - DIALYSIS  60 mcg Intravenous Q Mon-HD  . docusate sodium  100 mg Oral BID  . doxercalciferol      . doxercalciferol  6 mcg Intravenous Q M,W,F-HD  . famotidine  20 mg Oral Daily  . feeding supplement (PRO-STAT SUGAR FREE 64)  30 mL Oral BID  . gabapentin  100 mg Oral QHS  . insulin aspart  0-9 Units Subcutaneous TID WC  . insulin glargine  3 Units Subcutaneous QHS  . midodrine  10 mg Oral Q M,W,F-HD  . multivitamin  1 tablet Oral QHS  . pantoprazole  40 mg Oral BID AC  . PARoxetine  10 mg Oral Daily  . polyethylene glycol  17 g Oral Daily  . simvastatin  40 mg Oral q1800  . sucralfate  1 g Oral TID WC & HS  . thiamine  100 mg Oral Daily  . warfarin  5 mg Oral ONCE-1800  . Warfarin - Pharmacist Dosing Inpatient   Does not apply q1800    Continuous Infusions: . ampicillin-sulbactam (UNASYN) IV Stopped (07/18/17 2143)     LOS: 7 days    Time spent: 40 minutes   Dessa Phi, DO [facility] [email or web address] Password [email or web address] 07/19/2017, 2:42 PM

## 2017-07-19 NOTE — Care Management Note (Signed)
Case Management Note  Patient Details  Name: Ashlee Miles MRN: [id number] Date of Birth: Nov 20, 1940   Subjective/Objective:   Admitted with cellulitis of LLE   Resides with daughter [name]. PTA pt received home health services with  [facility],and [facility] 3.5 hrs/ 5 days a week.       Belenda Cruise (Daughter) Sherrilee Gilles (Daughter)    [phone] or [phone] (c) [phone]     PCP: Darlina Rumpf  Action/Plan:  Per PT's evaluation: Home health PT;Supervision/Assistance - 24 hour. Daughter [name] states pt will not have 24/7 supervision /assistance @ d/c 2/2 her having recent heart surgery and her husband will have knee surgery Feb. [name] states she would like for mom to be placed in SNF/REHAB.  CSW made aware per CM.Marland Kitchen..CM continuing to follow for transitional care needs.  Expected Discharge Date:                  Expected Discharge Plan:  Skilled Nursing Facility  In-House Referral:  Clinical Social Work  Discharge planning Services  CM Consult  Post Acute Care Choice:    Choice offered to:  Patient, Adult Children  DME Arranged:    DME Agency:     HH Arranged:    [place] Agency:     Status of Service:  In process, will continue to follow  If discussed at Long Length of Stay Meetings, dates discussed:    Additional Comments:  Sharin Mons, RN 07/19/2017, 10:09 AM

## 2017-07-19 NOTE — Telephone Encounter (Signed)
Patients daughter called on behalf of patient stating that her mother cannot make it to her appointment tomorrow with Dr. Marlou Sa. She is worried about her mothers wrist and would like to know if Dr. Marlou Sa could come see her at the hospital. CB # [phone] or [phone]

## 2017-07-19 NOTE — Progress Notes (Signed)
Occupational Therapy Treatment Patient Details Name: Ashlee Miles MRN: [id number] DOB: 1940/08/10 Today's Date: 07/19/2017    History of present illness 77yo female with chronic wound vac in place L medial thigh, presenting to the ED wit increased L LE erythema and pain L upper leg. Received I&D L thigh 07/14/17. PMH HTN, GERD, protein C deficiency, pituitary adenoma, hx DVT on coumadin, ESRD on dialysis, CHF, DM, chronic wound vac placement, IVC filter, hx thrombectomy with AV Goretex graft    OT comments  Pt performed toileting and seated grooming with set up to mod assist. Transferred to recliner with min guard assist with platform walker. Pt very concerned about her L wrist not healing, was supposed to have an ortho appointment tomorrow and wonders if ortho could see her here. She is not able to recall the name.   Follow Up Recommendations  Home health OT;Supervision/Assistance - 24 hour    Equipment Recommendations  None recommended by OT    Recommendations for Other Services      Precautions / Restrictions Precautions Precautions: Fall Precaution Comments: per most recent ortho note, NWB LUE but can use platform walker  Required Braces or Orthoses: Other Brace/Splint Other Brace/Splint: wrist brace Restrictions LUE Weight Bearing: Non weight bearing Other Position/Activity Restrictions: can use platform walker        Mobility Bed Mobility Overal bed mobility: Needs Assistance Bed Mobility: Supine to Sit     Supine to sit: Min assist     General bed mobility comments: increased time, assist for L LE, HOB up  Transfers   Equipment used: Left platform walker Transfers: Sit to/from Stand Sit to Stand: Min guard Stand pivot transfers: Min guard       General transfer comment: no physical assist, bed heightened slightly    Balance Overall balance assessment: Needs assistance   Sitting balance-[name]: Good       Standing balance-[name]:  Poor Standing balance comment: reliant on walker for OOB mobility                           ADL either performed or assessed with clinical judgement   ADL Overall ADL's : Needs assistance/impaired     Grooming: Oral care;Sitting;Set up           Upper Body Dressing : Minimal assistance;Sitting   Lower Body Dressing: Maximal assistance;Sitting/lateral leans Lower Body Dressing Details (indicate cue type and reason): for slippers Toilet Transfer: Min guard;Stand-pivot;RW;BSC   Toileting- Clothing Manipulation and Hygiene: Moderate assistance;Sit to/from stand               Vision       Perception     Praxis      Cognition Arousal/Alertness: Awake/alert Behavior During Therapy: WFL for tasks assessed/performed Overall Cognitive Status: Within Functional Limits for tasks assessed                                          Exercises     Shoulder Instructions       General Comments      Pertinent Vitals/ Pain       Pain Assessment: Faces Faces Pain Scale: Hurts little more Pain Location: L wristL LE Pain Descriptors / Indicators: Discomfort;Grimacing;Sore;Tender Pain Intervention(s): Monitored during session;Repositioned  Home Living  Prior Functioning/Environment              Frequency  Min 2X/week        Progress Toward Goals  OT Goals(current goals can now be found in the care plan section)  Progress towards OT goals: Progressing toward goals  Acute Rehab OT Goals Patient Stated Goal: to get better OT Goal Formulation: With patient Time For Goal Achievement: 08/01/17 Potential to Achieve Goals: [place] Discharge plan remains appropriate    Co-evaluation                 AM-PAC PT "6 Clicks" Daily Activity     Outcome Measure   Help from another person eating meals?: A Little Help from another person taking care of personal grooming?: A  Little Help from another person toileting, which includes using toliet, bedpan, or urinal?: A Lot Help from another person bathing (including washing, rinsing, drying)?: A Lot Help from another person to put on and taking off regular upper body clothing?: A Little Help from another person to put on and taking off regular lower body clothing?: A Lot 6 Click Score: 15    End of Session Equipment Utilized During Treatment: Gait belt;Rolling walker  OT Visit Diagnosis: Unsteadiness on feet (R26.81);Other abnormalities of gait and mobility (R26.89);History of falling (Z91.81);Pain Pain - Right/Left: Left Pain - part of body: Leg;Hand   Activity Tolerance Patient tolerated treatment well   Patient Left in chair;with call bell/phone within reach;with chair alarm set   Nurse Communication          Time: 3582-5189 OT Time Calculation (min): 21 min  Charges: OT General Charges $OT Visit: 1 Visit OT Treatments $Self Care/Home Management : 8-22 mins  07/19/2017 Ashlee Miles, OTR/L Pager: [phone] Ashlee Miles 07/19/2017, 10:17 AM

## 2017-07-19 NOTE — Telephone Encounter (Signed)
Please advise. You have never seen patient. Patient was initially seen by Jeneen Rinks for wrist fracture and she was to follow up with you in 3 weeks for the wrist. However, patient currently in hospital and patients daughter called asking if you could see her for her wrist in the hospital.

## 2017-07-19 NOTE — Progress Notes (Signed)
Patient ID: Ashlee Miles, female   DOB: [birth year], 77 y.o.   MRN: [id number]  [facility] Progress Note    Subjective:   Seen in dialysis No new issues or complaints.   Objective:   BP (!) 131/45 (BP Location: Right Arm)   Pulse 73   Temp 97.7 F (36.5 C) (Oral)   Resp 16   Ht 5\' 2"  (1.575 m)   Wt 98.9 kg (218 lb 0.6 oz)   SpO2 100%   BMI 39.88 kg/m   Intake/Output: I/O last 3 completed shifts: In: 450 [P.O.:240; I.V.:10; IV Piggyback:200] Out: 2 [Urine:1; Stool:1]   Intake/Output this shift:  No intake/output data recorded. Weight change: -0.4 kg (-14.1 oz)  Physical Exam: NAD Lungs clear Normal heart sounds S1S2 No S3 Obese non tender abdomen Ext: Trace - 1+ RLE edema, very dysesthetic L thigh AVG +T/B, left leg above knee wrapped in ace-bandage.  No erythema or warmth.   Recent Labs  Lab 07/14/17 0447 07/14/17 1740 07/17/17 0822 07/18/17 0418  NA 136 135 132* 135  K 3.7 3.3* 3.9 3.9  CL 96* 96* 94* 98*  CO2 25 28 23 25   GLUCOSE 124* 187* 115* 99  BUN 31* 7 38* 23*  CREATININE 5.72* 2.71* 7.49* 5.44*  ALBUMIN  --  2.9* 2.6* 2.6*  CALCIUM 7.8* 7.4* 7.6* 7.4*  PHOS  --  2.1* 4.8* 3.4   CBC Recent Labs  Lab 07/15/17 0404 07/16/17 0406 07/17/17 0359 07/18/17 0418  WBC 6.9 6.1 8.0 8.5  HGB 9.9* 9.8* 10.2* 10.4*  HCT 31.3* 31.1* 31.9* 32.1*  MCV 82.2 81.6 81.4 82.1  PLT 149* 151 202 202     Medications:    . carvedilol  3.125 mg Oral BID  . cinacalcet  30 mg Oral Q supper  . collagenase   Topical BID  . darbepoetin (ARANESP) injection - DIALYSIS  60 mcg Intravenous Q Mon-HD  . docusate sodium  100 mg Oral BID  . doxercalciferol  6 mcg Intravenous Q M,W,F-HD  . famotidine  20 mg Oral Daily  . feeding supplement (PRO-STAT SUGAR FREE 64)  30 mL Oral BID  . gabapentin  300 mg Oral QHS  . insulin aspart  0-9 Units Subcutaneous TID WC  . insulin glargine  3 Units Subcutaneous QHS  . midodrine  10 mg Oral Q M,W,F-HD  .  multivitamin  1 tablet Oral QHS  . pantoprazole  40 mg Oral BID AC  . PARoxetine  10 mg Oral Daily  . polyethylene glycol  17 g Oral Daily  . simvastatin  40 mg Oral q1800  . sucralfate  1 g Oral TID WC & HS  . thiamine  100 mg Oral Daily  . warfarin  5 mg Oral ONCE-1800  . Warfarin - Pharmacist Dosing Inpatient   Does not apply q1800   Dialysis Orders: MWF -NW 4h  98.5kg  3k?2.25 bath  L thigh AVG  Hep 3000 Hectorol 37mcg IV qHD TIW Venofer 100mg  IV qHD mircera 115mcg IV q2wks - no longer ordered, last 1/11 hgb 12 (was on 225 prior to that)    Assessment/ Plan:    1. Left leg cellulitis/abscess- below AVG s/p drainage, wound culture + enterococcus faecalis.  Blood cx no growth to date.  On Unasyn and will transition to po amoxicillin when ready for discharge per Dr. Trula Slade.  2. ESRD continue with HD qMWF. HD today. Only 1 kg over EDW - goal 2L - may have lower EDW at  discharge.    3. Vascular access- using left thigh AVG (only cannulating the lateral side). 4. 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 Continue sensipar and hectorol.  6. Nutrition: renal diet 7. Hypertension: stable 8. H/o DVT/protein C deficiency- lifelong anticoagulation.  Dosing per pharmacy 9. GERD- on carafate but should limit use due to aluminum content.  Disposition - when [facility] feels ready for discharge plan is for change to po amoxicillin for home.  Jamal Maes, MD [facility] [phone] Pager 07/19/2017, 11:34 AM

## 2017-07-20 ENCOUNTER — Ambulatory Visit (INDEPENDENT_AMBULATORY_CARE_PROVIDER_SITE_OTHER): Payer: 59 | Admitting: Orthopedic Surgery

## 2017-07-20 LAB — CBC
HCT: 31.9 % — ABNORMAL LOW (ref 36.0–46.0)
Hemoglobin: 9.7 g/dL — ABNORMAL LOW (ref 12.0–15.0)
MCH: 25.1 pg — ABNORMAL LOW (ref 26.0–34.0)
MCHC: 30.4 g/dL (ref 30.0–36.0)
MCV: 82.6 fL (ref 78.0–100.0)
Platelets: 277 10*3/uL (ref 150–400)
RBC: 3.86 MIL/uL — ABNORMAL LOW (ref 3.87–5.11)
RDW: 17.4 % — ABNORMAL HIGH (ref 11.5–15.5)
WBC: 5.9 10*3/uL (ref 4.0–10.5)

## 2017-07-20 LAB — GLUCOSE, CAPILLARY
Glucose-Capillary: 126 mg/dL — ABNORMAL HIGH (ref 65–99)
Glucose-Capillary: 198 mg/dL — ABNORMAL HIGH (ref 65–99)
Glucose-Capillary: 223 mg/dL — ABNORMAL HIGH (ref 65–99)
Glucose-Capillary: 176 mg/dL — ABNORMAL HIGH (ref 65–99)

## 2017-07-20 LAB — PROTIME-INR
INR: 2.81
Prothrombin Time: 29.4 seconds — ABNORMAL HIGH (ref 11.4–15.2)

## 2017-07-20 LAB — BASIC METABOLIC PANEL
Anion gap: 12 (ref 5–15)
BUN: 13 mg/dL (ref 6–20)
CO2: 26 mmol/L (ref 22–32)
Calcium: 7.7 mg/dL — ABNORMAL LOW (ref 8.9–10.3)
Chloride: 98 mmol/L — ABNORMAL LOW (ref 101–111)
Creatinine, Ser: 4.62 mg/dL — ABNORMAL HIGH (ref 0.44–1.00)
GFR calc Af Amer: 10 mL/min — ABNORMAL LOW (ref >60)
GFR calc non Af Amer: 8 mL/min — ABNORMAL LOW (ref >60)
Glucose, Bld: 164 mg/dL — ABNORMAL HIGH (ref 65–99)
Potassium: 3.8 mmol/L (ref 3.5–5.1)
Sodium: 136 mmol/L (ref 135–145)

## 2017-07-20 MED ORDER — WARFARIN SODIUM 5 MG PO TABS
5.0000 mg | ORAL_TABLET | Freq: Once | ORAL | Status: AC
  Administered 2017-07-20: 5 mg via ORAL
  Filled 2017-07-20: qty 1

## 2017-07-20 NOTE — Telephone Encounter (Signed)
See response from Abigail Butts about charge question that you had.

## 2017-07-20 NOTE — Progress Notes (Signed)
PROGRESS NOTE    Ashlee Miles  YOV:[id number] DOB: Oct 25, 1940 DOA: 07/11/2017 PCP: Darlina Rumpf, MD     Brief Narrative:  Ashlee Miles is a 77 year old female with a past medical history of end-stage renal disease on hemodialysis, hypertension, hyperlipidemia, protein C deficiency, history of pituitary adenoma, history of DVT on warfarin, type 2 diabetes, wound VAC in the left lower extremity presented with increasing pain and erythema of the left upper leg.  Patient recently underwent left thigh graft thrombectomy and had a wound with wound VAC.  Patient was hospitalized for further management.  CT scan showed fluid collection in the left lower extremity.  Patient was taken to the OR on 1/25 for drainage.  Cultures positive for enterococcus.  Antibiotics changed after discussions with infectious disease.  Currently on Unasyn.  Assessment & Plan:   Principal Problem:   Cellulitis of left lower extremity Active Problems:   Essential hypertension   DVT (deep venous thrombosis) (HCC)   ESRD on dialysis ([place])   Anemia due to chronic kidney disease   Diabetes mellitus with end stage renal disease (HCC)   GERD (gastroesophageal reflux disease)   Protein C deficiency (HCC)   Leg wound, left   HLD (hyperlipidemia)   Cellulitis and possible abscess of the left lower extremity in the setting of chronic wound and recent surgery Patient underwent vascular surgery recently to that area.  She had a wound VAC in place.  CT scan raised concern for fluid collection.  Vascular surgery was consulted.  Patient was placed on vancomycin and Zosyn.  Patient taken to the operating room on 1/25 for surgical drainage.  Lactic acid level was normal.  Pro-calcitonin 8.26.  WBC was noted to be elevated and has been normal subsequently.  Cultures from the drain fluid positive for enterococcus.  Discussed with infectious disease and vascular surgery.  She was changed over to Unasyn.  MRSA PCR has been negative  previously.  Blood cultures have been without any growth.  Wound care per vascular surgery.  Once wound is thought to be stable then she could be discharged on oral amoxicillin. Await further vascular surgery plan prior to discharge.   History of DVT and protein C deficiency She was on warfarin which was reversed for surgery.  Patient was placed on IV heparin.  Warfarin has been resumed.   History of essential hypertension Blood pressure was noted to be low at hemodialysis.  Appears to have stabilized.  Dose of carvedilol was reduced.  Blood pressures have stabilized.  Continue to monitor.  She is also noted to be on midodrine with HD.    End-stage renal disease on hemodialysis (MWF) Nephrology is following.  She is being dialyzed as per her usual schedule.  Anemia of chronic kidney disease Hemoglobin stable.  No evidence of overt bleeding.  Continue to monitor.  History of diabetes mellitus type 2 in the setting of end-stage renal disease Continue Lantus.  CBGs are reasonably well controlled.  History of GERD Continue home medications.  Comminuted fracture left radius Seen by [facility] previously.  Plan was to follow-up at the end of this month.  X-ray was done which shows stable alignment. Family requested ortho eval and has notified Dr. Randel Pigg office directly from telephone encounters.    DVT prophylaxis: coumadin Code Status: DNR Family Communication: no family at bedside Disposition Plan: SNF when cleared by vascular surgery team, possibly 2/1    Consultants:   Vascular surgery  Nephrology  Procedures:  1.Incision and drainage  of abscess left thigh 2.Excisional debridement of wound left thigh (subcutaneous tissue)  Antimicrobials:  Anti-infectives (From admission, onward)   Start     Dose/Rate Route Frequency Ordered Stop   07/16/17 2000  Ampicillin-Sulbactam (UNASYN) 3 g in sodium chloride 0.9 % 100 mL IVPB     3 g 200 mL/hr over 30 Minutes  Intravenous Every 24 hours 07/16/17 1252     07/14/17 1512  Vancomycin (VANCOCIN) 750-5 MG/150ML-% IVPB    Comments:  Herriott, Melisa   : cabinet override      07/14/17 1512 07/15/17 0314   07/13/17 0845  vancomycin (VANCOCIN) IVPB 750 mg/150 ml premix     750 mg 150 mL/hr over 60 Minutes Intravenous  Once 07/13/17 0841 07/13/17 1101   07/12/17 1200  vancomycin (VANCOCIN) IVPB 750 mg/150 ml premix  Status:  Discontinued     750 mg 150 mL/hr over 60 Minutes Intravenous Every M-W-F (Hemodialysis) 07/12/17 0037 07/16/17 1236   07/12/17 0830  piperacillin-tazobactam (ZOSYN) IVPB 3.375 g  Status:  Discontinued     3.375 g 12.5 mL/hr over 240 Minutes Intravenous Every 12 hours 07/12/17 0816 07/16/17 1236   07/12/17 0100  vancomycin (VANCOCIN) 1,500 mg in sodium chloride 0.9 % 500 mL IVPB     1,500 mg 250 mL/hr over 120 Minutes Intravenous  Once 07/12/17 0037 07/12/17 0331   07/11/17 2330  piperacillin-tazobactam (ZOSYN) IVPB 3.375 g  Status:  Discontinued    Comments:  Zosyn 3.375 g IV q12h in ESRD on HD   3.375 g 12.5 mL/hr over 240 Minutes Intravenous Every 12 hours 07/11/17 2325 07/12/17 0001       Subjective: No new issues.   Objective: Vitals:   07/19/17 1530 07/19/17 1546 07/19/17 2126 07/20/17 0650  BP: (!) 91/57 (!) 140/57 (!) 128/20 (!) 143/41  Pulse: 73 74 73 80  Resp: 20 20 18 18   Temp:   98.2 F (36.8 C) 97.8 F (36.6 C)  TempSrc:      SpO2:   99% 100%  Weight:    97.3 kg (214 lb 8.1 oz)  Height:        Intake/Output Summary (Last 24 hours) at 07/20/2017 1321 Last data filed at 07/20/2017 1000 Gross per 24 hour  Intake 240 ml  Output 1698 ml  Net -1458 ml   Filed Weights   07/19/17 0424 07/19/17 1130 07/20/17 0650  Weight: 98.9 kg (218 lb 0.6 oz) 98.5 kg (217 lb 2.5 oz) 97.3 kg (214 lb 8.1 oz)    Examination:  General exam: Appears calm and comfortable  Respiratory system: Clear to auscultation. Respiratory effort normal. Cardiovascular system: S1 & S2  heard, RRR. No JVD, murmurs, rubs, gallops or clicks. No pedal edema. Gastrointestinal system: Abdomen is nondistended, soft and nontender. No organomegaly or masses felt. Normal bowel sounds heard. Central nervous system: Alert and oriented. No focal neurological deficits. Extremities: Symmetric Skin: +left thigh with clean and dry dressing Psychiatry: Judgement and insight appear normal. Mood & affect appropriate.   Data Reviewed: I have personally reviewed following labs and imaging studies  CBC: Recent Labs  Lab 07/16/17 0406 07/17/17 0359 07/18/17 0418 07/19/17 1153 07/20/17 0306  WBC 6.1 8.0 8.5 7.1 5.9  HGB 9.8* 10.2* 10.4* 10.4* 9.7*  HCT 31.1* 31.9* 32.1* 32.6* 31.9*  MCV 81.6 81.4 82.1 81.1 82.6  PLT 151 202 202 293 660   Basic Metabolic Panel: Recent Labs  Lab 07/14/17 1740 07/17/17 0822 07/18/17 0418 07/19/17 1153 07/20/17 0306  NA 135  132* 135 132* 136  K 3.3* 3.9 3.9 3.5 3.8  CL 96* 94* 98* 94* 98*  CO2 28 23 25 23 26   GLUCOSE 187* 115* 99 168* 164*  BUN 7 38* 23* 33* 13  CREATININE 2.71* 7.49* 5.44* 7.66* 4.62*  CALCIUM 7.4* 7.6* 7.4* 7.8* 7.7*  PHOS 2.1* 4.8* 3.4 5.1*  --    GFR: Estimated Creatinine Clearance: 11.3 mL/min (A) (by C-G formula based on SCr of 4.62 mg/dL (H)). Liver Function Tests: Recent Labs  Lab 07/14/17 1740 07/17/17 0822 07/18/17 0418 07/19/17 1153  ALBUMIN 2.9* 2.6* 2.6* 2.8*   No results for input(s): LIPASE, AMYLASE in the last 168 hours. No results for input(s): AMMONIA in the last 168 hours. Coagulation Profile: Recent Labs  Lab 07/17/17 0359 07/17/17 1638 07/18/17 0418 07/19/17 0413 07/20/17 0306  INR 2.00 2.12 2.25 2.66 2.81   Cardiac Enzymes: No results for input(s): CKTOTAL, CKMB, CKMBINDEX, TROPONINI in the last 168 hours. BNP (last 3 results) No results for input(s): PROBNP in the last 8760 hours. HbA1C: No results for input(s): HGBA1C in the last 72 hours. CBG: Recent Labs  Lab 07/19/17 0749  07/19/17 1730 07/19/17 2154 07/20/17 0807 07/20/17 1213  GLUCAP 118* 238* 190* 126* 198*   Lipid Profile: No results for input(s): CHOL, HDL, LDLCALC, TRIG, CHOLHDL, LDLDIRECT in the last 72 hours. Thyroid Function Tests: No results for input(s): TSH, T4TOTAL, FREET4, T3FREE, THYROIDAB in the last 72 hours. Anemia Panel: No results for input(s): VITAMINB12, FOLATE, FERRITIN, TIBC, IRON, RETICCTPCT in the last 72 hours. Sepsis Labs: No results for input(s): PROCALCITON, LATICACIDVEN in the last 168 hours.  Recent Results (from the past 240 hour(s))  Culture, blood (routine x 2)     Status: None   Collection Time: 07/12/17 10:40 AM  Result Value Ref Range Status   Specimen Description BLOOD RIGHT ARM  Final   Special Requests   Final    BOTTLES DRAWN AEROBIC AND ANAEROBIC Blood Culture adequate volume   Culture NO GROWTH 5 DAYS  Final   Report Status 07/17/2017 FINAL  Final  Culture, blood (routine x 2)     Status: None   Collection Time: 07/12/17 10:50 AM  Result Value Ref Range Status   Specimen Description BLOOD RIGHT HAND  Final   Special Requests   Final    BOTTLES DRAWN AEROBIC AND ANAEROBIC Blood Culture adequate volume   Culture NO GROWTH 5 DAYS  Final   Report Status 07/17/2017 FINAL  Final  MRSA PCR Screening     Status: None   Collection Time: 07/13/17  9:29 AM  Result Value Ref Range Status   MRSA by PCR NEGATIVE NEGATIVE Final    Comment:        The GeneXpert MRSA Assay (FDA approved for NASAL specimens only), is one component of a comprehensive MRSA colonization surveillance program. It is not intended to diagnose MRSA infection nor to guide or monitor treatment for MRSA infections.   Aerobic/Anaerobic Culture (surgical/deep wound)     Status: None   Collection Time: 07/14/17  8:11 AM  Result Value Ref Range Status   Specimen Description ABSCESS LEFT UPPER THIGH  Final   Special Requests SWAB SPEC A  Final   Gram Stain   Final    MODERATE WBC PRESENT,  PREDOMINANTLY PMN NO ORGANISMS SEEN    Culture FEW ENTEROCOCCUS FAECALIS NO ANAEROBES ISOLATED   Final   Report Status 07/19/2017 FINAL  Final   Organism ID, Bacteria ENTEROCOCCUS FAECALIS  Final      Susceptibility   Enterococcus faecalis - MIC*    AMPICILLIN <=2 SENSITIVE Sensitive     VANCOMYCIN 2 SENSITIVE Sensitive     GENTAMICIN SYNERGY SENSITIVE Sensitive     * FEW ENTEROCOCCUS FAECALIS       Radiology Studies: Dg Wrist 2 Views Left  Result Date: 07/19/2017 CLINICAL DATA:  Fracture EXAM: LEFT WRIST - 2 VIEW COMPARISON:  Plain film of the left wrist dated 07/17/2017. FINDINGS: The distal radius fracture appears stable in alignment. Again noted is probable early callus formation but fracture lines are still clearly visible. No new osseous or soft tissue abnormality. IMPRESSION: Stable alignment/appearance of the distal radius fracture. Probable early callus formation but fracture lines are still clearly visible. Electronically Signed   By: Franki Cabot M.D.   On: 07/19/2017 19:57      Scheduled Meds: . carvedilol  3.125 mg Oral BID  . cinacalcet  30 mg Oral Q supper  . collagenase   Topical BID  . darbepoetin (ARANESP) injection - DIALYSIS  60 mcg Intravenous Q Mon-HD  . docusate sodium  100 mg Oral BID  . doxercalciferol  6 mcg Intravenous Q M,W,F-HD  . famotidine  20 mg Oral Daily  . feeding supplement (PRO-STAT SUGAR FREE 64)  30 mL Oral BID  . gabapentin  100 mg Oral QHS  . insulin aspart  0-9 Units Subcutaneous TID WC  . insulin glargine  3 Units Subcutaneous QHS  . midodrine  10 mg Oral Q M,W,F-HD  . multivitamin  1 tablet Oral QHS  . pantoprazole  40 mg Oral BID AC  . PARoxetine  10 mg Oral Daily  . polyethylene glycol  17 g Oral Daily  . simvastatin  40 mg Oral q1800  . sucralfate  1 g Oral TID WC & HS  . thiamine  100 mg Oral Daily  . warfarin  5 mg Oral ONCE-1800  . Warfarin - Pharmacist Dosing Inpatient   Does not apply q1800   Continuous  Infusions: . ampicillin-sulbactam (UNASYN) IV Stopped (07/19/17 2248)     LOS: 8 days    Time spent: 30 minutes   Dessa Phi, DO [facility] [email or web address] Password [email or web address] 07/20/2017, 1:21 PM

## 2017-07-20 NOTE — Progress Notes (Signed)
Physical Therapy Treatment Patient Details Name: Ashlee Miles MRN: [id number] DOB: [birth year] Today's Date: 07/20/2017    History of Present Illness 77yo female with chronic wound vac in place L medial thigh, presenting to the ED wit increased L LE erythema and pain L upper leg. Received I&D L thigh 07/14/17. PMH HTN, GERD, protein C deficiency, pituitary adenoma, hx DVT on coumadin, ESRD on dialysis, CHF, DM, chronic wound vac placement, IVC filter, hx thrombectomy with AV Goretex graft     PT Comments    Patient received in bed, pleasant and willing to work with skilled PT services today. She continues to require MinA to perform safe functional bed mobility, but is able to perform functional transfers with close min guard from standard height surface/MinA from lower surfaces. Able to ambulate to bathroom, where patient had numerous loose stools and did require assist for cleaning/self-care, then continued ambulation in room today with cues for safety and correct use of platform walker. She was left up in the chair with all needs met this morning, chair alarm set.     Follow Up Recommendations  Home health PT;Supervision/Assistance - 24 hour     Equipment Recommendations  Other (comment)(L platform walker )    Recommendations for Other Services       Precautions / Restrictions Precautions Precautions: Fall Precaution Comments: per most recent ortho note, NWB L LE but can use platform walker  Required Braces or Orthoses: Other Brace/Splint Other Brace/Splint: wrist brace Restrictions Weight Bearing Restrictions: No LUE Weight Bearing: Non weight bearing Other Position/Activity Restrictions: can use platform walker     Mobility  Bed Mobility Overal bed mobility: Needs Assistance Bed Mobility: Supine to Sit     Supine to sit: Min assist     General bed mobility comments: increased time, MinA to bring trunk up at EOB   Transfers Overall transfer level: Needs  assistance Equipment used: Left platform walker Transfers: Sit to/from Stand Sit to Stand: Min guard         General transfer comment: close min guard, no physical assist from bed level but did requrie MinA from lower surface of toilet   Ambulation/Gait Ambulation/Gait assistance: Min guard Ambulation Distance (Feet): 35 Feet Assistive device: Left platform walker Gait Pattern/deviations: Step-through pattern;Decreased step length - right;Decreased step length - left;Trunk flexed;Antalgic     General Gait Details: cues for safety during gait, antalgic pattern noted    Stairs            Wheelchair Mobility    Modified Rankin (Stroke Patients Only)       Balance Overall balance assessment: Needs assistance Sitting-balance support: Feet supported;Single extremity supported Sitting balance-[name]: Good Sitting balance - Comments: sitting EOB with no back support   Standing balance support: Bilateral upper extremity supported;During functional activity Standing balance-[name]: Fair Standing balance comment: reliant on walker for OOB mobility                            Cognition Arousal/Alertness: Awake/alert Behavior During Therapy: WFL for tasks assessed/performed Overall Cognitive Status: Within Functional Limits for tasks assessed                                        Exercises      General Comments General comments (skin integrity, edema, etc.): requires assistance for self-care following toileting today  Pertinent Vitals/Pain Pain Assessment: 0-10 Pain Score: 4  Pain Location: L LE  Pain Descriptors / Indicators: Discomfort;Grimacing;Sore;Tender Pain Intervention(s): Monitored during session;Limited activity within patient's tolerance;Repositioned    Home Living                      Prior Function            PT Goals (current goals can now be found in the care plan section) Acute Rehab PT  Goals Patient Stated Goal: to get better PT Goal Formulation: With patient Potential to Achieve Goals: Good Progress towards PT goals: Progressing toward goals    Frequency    Min 3X/week      PT Plan Current plan remains appropriate    Co-evaluation              AM-PAC PT "6 Clicks" Daily Activity  Outcome Measure  Difficulty turning over in bed (including adjusting bedclothes, sheets and blankets)?: Unable Difficulty moving from lying on back to sitting on the side of the bed? : Unable Difficulty sitting down on and standing up from a chair with arms (e.g., wheelchair, bedside commode, etc,.)?: Unable Help needed moving to and from a bed to chair (including a wheelchair)?: A Little Help needed walking in hospital room?: A Little Help needed climbing 3-5 steps with a railing? : A Lot 6 Click Score: 11    End of Session Equipment Utilized During Treatment: Gait belt;Other (comment)(brace L wrist ) Activity Tolerance: Patient tolerated treatment well Patient left: in chair;with call bell/phone within reach;with chair alarm set   PT Visit Diagnosis: Muscle weakness (generalized) (M62.81);Difficulty in walking, not elsewhere classified (R26.2);Unsteadiness on feet (R26.81);History of falling (Z91.81) Pain - Right/Left: Left Pain - part of body: Leg     Time: 9767-3419 PT Time Calculation (min) (ACUTE ONLY): 18 min  Charges:  $Therapeutic Activity: 8-22 mins                    G Codes:       Ashlee Miles PT, DPT, CBIS  Supplemental Physical Therapist [place]   Pager [phone]

## 2017-07-20 NOTE — Progress Notes (Signed)
ANTICOAGULATION CONSULT NOTE - Follow Up Consult  Pharmacy Consult for warfarin Indication: history of DVT, protein C deficiency  No Active Allergies  Patient Measurements: Height: 5\' 2"  (157.5 cm) Weight: 214 lb 8.1 oz (97.3 kg) IBW/kg (Calculated) : 50.1 Heparin Dosing Weight: 73 kg  Labs: Recent Labs    07/18/17 0418 07/19/17 0413 07/19/17 1153 07/20/17 0306  HGB 10.4*  --  10.4* 9.7*  HCT 32.1*  --  32.6* 31.9*  PLT 202  --  293 277  LABPROT 24.7* 28.1*  --  29.4*  INR 2.25 2.66  --  2.81  CREATININE 5.44*  --  7.66* 4.62*    Estimated Creatinine Clearance: 11.3 mL/min (A) (by C-G formula based on SCr of 4.62 mg/dL (H)).   Assessment: 77 yo F on Coumadin PTA for hx of DVT and protein C deficiency.  Patient was admitted with LLE cellulitis> CT scan noted fluid collection on LLE concerning for abscess / infection with plans for drainage, so INR was reversed for procedure and IV heparin initiated. Now patient is s/p I&D on 1/25 with heparin and warfarin resumed post-op.    Heparin now stopped INR today = 2.81  Home dose of warfarin was 5mg  daily.  Goal of Therapy:  INR = 2 to 3 Monitor platelets by anticoagulation protocol: Yes   Plan:  Warfarin 5 mg po x 1 tonight Daily INR  Thank you Anette Guarneri, PharmD [phone] 07/20/2017 8:51 AM

## 2017-07-20 NOTE — Plan of Care (Signed)
  Skin Integrity: Risk for impaired skin integrity will decrease 07/20/2017 0620 - Progressing by Verne Grain, RN  Patient wants to have dressing changed in morning due to pain and room temp cold.

## 2017-07-20 NOTE — Progress Notes (Signed)
Patient ID: Ashlee Miles, female   DOB: 10/24/1940, 77 y.o.   MRN: [id number]  [facility] Progress Note    Subjective:   Seen in room. Eating breakfast, comfortable No new c/os  HD yesterday UF 1.7L  Ortho to see for prior hand fx    Objective:   BP (!) 143/41 (BP Location: Right Arm)   Pulse 80   Temp 97.8 F (36.6 C)   Resp 18   Ht 5\' 2"  (1.575 m)   Wt 97.3 kg (214 lb 8.1 oz)   SpO2 100%   BMI 39.23 kg/m   Intake/Output: I/O last 3 completed shifts: In: -  Out: 1937 [Other:1698]   Intake/Output this shift:  No intake/output data recorded. Weight change: -0.4 kg (-14.1 oz)  Physical Exam: NAD Lungs clear Normal heart sounds S1S2 No S3 Obese non tender abdomen Ext: Trace - 1+ RLE edema, very dysesthetic L thigh AVG +T/B, left leg above knee wrapped in ace-bandage.  No erythema or warmth.   Recent Labs  Lab 07/14/17 0447 07/14/17 1740 07/17/17 0822 07/18/17 0418 07/19/17 1153 07/20/17 0306  NA 136 135 132* 135 132* 136  K 3.7 3.3* 3.9 3.9 3.5 3.8  CL 96* 96* 94* 98* 94* 98*  CO2 25 28 23 25 23 26   GLUCOSE 124* 187* 115* 99 168* 164*  BUN 31* 7 38* 23* 33* 13  CREATININE 5.72* 2.71* 7.49* 5.44* 7.66* 4.62*  ALBUMIN  --  2.9* 2.6* 2.6* 2.8*  --   CALCIUM 7.8* 7.4* 7.6* 7.4* 7.8* 7.7*  PHOS  --  2.1* 4.8* 3.4 5.1*  --    CBC Recent Labs  Lab 07/17/17 0359 07/18/17 0418 07/19/17 1153 07/20/17 0306  WBC 8.0 8.5 7.1 5.9  HGB 10.2* 10.4* 10.4* 9.7*  HCT 31.9* 32.1* 32.6* 31.9*  MCV 81.4 82.1 81.1 82.6  PLT 202 202 293 277     Medications:    . carvedilol  3.125 mg Oral BID  . cinacalcet  30 mg Oral Q supper  . collagenase   Topical BID  . darbepoetin (ARANESP) injection - DIALYSIS  60 mcg Intravenous Q Mon-HD  . docusate sodium  100 mg Oral BID  . doxercalciferol  6 mcg Intravenous Q M,W,F-HD  . famotidine  20 mg Oral Daily  . feeding supplement (PRO-STAT SUGAR FREE 64)  30 mL Oral BID  . gabapentin  100 mg Oral QHS  .  insulin aspart  0-9 Units Subcutaneous TID WC  . insulin glargine  3 Units Subcutaneous QHS  . midodrine  10 mg Oral Q M,W,F-HD  . multivitamin  1 tablet Oral QHS  . pantoprazole  40 mg Oral BID AC  . PARoxetine  10 mg Oral Daily  . polyethylene glycol  17 g Oral Daily  . simvastatin  40 mg Oral q1800  . sucralfate  1 g Oral TID WC & HS  . thiamine  100 mg Oral Daily  . warfarin  5 mg Oral ONCE-1800  . Warfarin - Pharmacist Dosing Inpatient   Does not apply q1800   . ampicillin-sulbactam (UNASYN) IV Stopped (07/19/17 2248)   Dialysis Orders: MWF -[facility] 4h  98.5kg  3k?2.25 bath  L thigh AVG  Hep 3000 Hectorol 82mcg IV qHD TIW Venofer 100mg  IV qHD mircera 127mcg IV q2wks - no longer ordered, last 1/11 hgb 12 (was on 225 prior to that)    Assessment/ Plan:    1. Left leg cellulitis/abscess- below AVG s/p drainage, wound culture + enterococcus  faecalis.  Blood cx no growth to date.  On Unasyn and will transition to po amoxicillin when ready for discharge per Dr. Trula Slade.  2. ESRD continue with HD qMWF. Next HD 2/1.     3. Vascular access- using left thigh AVG (only cannulating the lateral side). 4. Anemia:Hgb 9.7  restarted Aranesp 48mcg, no iron due to active infection for now 5. CKD-MBD: phos improved off of binders (was low at 2.1) but may need to restart at lower dose. Continue sensipar and hectorol.  6. Nutrition: renal diet 7. Hypertension: stable 8. H/o DVT/protein C deficiency- lifelong anticoagulation.  Dosing per pharmacy 9. GERD- on carafate but should limit use due to aluminum content. 10. Left distal radius fracture - xray shows good alignment and ? Some early callus formation  Disposition - when [facility] feels ready for discharge plan is for change to po amoxicillin for home.  Lynnda Child PA-C [facility] Pager [phone] 07/20/2017,10:36 AM   I have seen and examined this patient and agree with plan and assessment in the above note with  renal recommendations/intervention highlighted.  Not sure of discharge plan. Will need to know duration of ATB course recommended Jamal Maes, MD [place] [phone] Pager 07/20/2017, 12:07 PM

## 2017-07-20 NOTE — Telephone Encounter (Signed)
I ordered xray yesterday and will see today pls call daughter - also is she in charges?

## 2017-07-20 NOTE — Telephone Encounter (Signed)
She is not in charges at this time, no.  However, Jeneen Rinks has not completed his 06/29/17 office visit, so no charges at all are in the system for her.  Jeneen Rinks usually changes the physician for charges to [facility] when he enters them and has pt f/u with Marlou Sa, so Marlou Sa will get credit.  At this point Marlou Sa can enter charges if he wants.

## 2017-07-20 NOTE — Progress Notes (Signed)
Patient is 3 weeks out left distal radius fracture She has been in a removable wrist splint On exam she still has expected pain at the fracture site but the fracture site does appear to be moving as a unit without physical exam evidence of nonunion Plan to continue immobilization for 2 more weeks then begin therapy for range of motion exercises Radiographs show not much change in fracture alignment and possible early callus formation which is what it feels like on examination

## 2017-07-20 NOTE — Telephone Encounter (Signed)
Can you see if this patient is in charges for her wrist?  IC advised patients daughter per Dr Marlou Sa.

## 2017-07-21 ENCOUNTER — Telehealth: Payer: Self-pay | Admitting: Surgery

## 2017-07-21 LAB — CBC
HCT: 31.5 % — ABNORMAL LOW (ref 36.0–46.0)
Hemoglobin: 10 g/dL — ABNORMAL LOW (ref 12.0–15.0)
MCH: 26 pg (ref 26.0–34.0)
MCHC: 31.7 g/dL (ref 30.0–36.0)
MCV: 82 fL (ref 78.0–100.0)
Platelets: 291 10*3/uL (ref 150–400)
RBC: 3.84 MIL/uL — ABNORMAL LOW (ref 3.87–5.11)
RDW: 18.1 % — ABNORMAL HIGH (ref 11.5–15.5)
WBC: 5.9 10*3/uL (ref 4.0–10.5)

## 2017-07-21 LAB — BASIC METABOLIC PANEL
Anion gap: 11 (ref 5–15)
BUN: 23 mg/dL — ABNORMAL HIGH (ref 6–20)
CO2: 27 mmol/L (ref 22–32)
Calcium: 7.7 mg/dL — ABNORMAL LOW (ref 8.9–10.3)
Chloride: 98 mmol/L — ABNORMAL LOW (ref 101–111)
Creatinine, Ser: 6.68 mg/dL — ABNORMAL HIGH (ref 0.44–1.00)
GFR calc Af Amer: 6 mL/min — ABNORMAL LOW (ref >60)
GFR calc non Af Amer: 5 mL/min — ABNORMAL LOW (ref >60)
Glucose, Bld: 148 mg/dL — ABNORMAL HIGH (ref 65–99)
Potassium: 3.5 mmol/L (ref 3.5–5.1)
Sodium: 136 mmol/L (ref 135–145)

## 2017-07-21 LAB — PROTIME-INR
INR: 2.87
Prothrombin Time: 29.9 seconds — ABNORMAL HIGH (ref 11.4–15.2)

## 2017-07-21 LAB — GLUCOSE, CAPILLARY: Glucose-Capillary: 92 mg/dL (ref 65–99)

## 2017-07-21 MED ORDER — HEPARIN SODIUM (PORCINE) 1000 UNIT/ML DIALYSIS
20.0000 [IU]/kg | INTRAMUSCULAR | Status: DC | PRN
  Filled 2017-07-21: qty 2

## 2017-07-21 MED ORDER — HEPARIN SODIUM (PORCINE) 1000 UNIT/ML DIALYSIS
1000.0000 [IU] | INTRAMUSCULAR | Status: DC | PRN
  Filled 2017-07-21: qty 1

## 2017-07-21 MED ORDER — DOXERCALCIFEROL 4 MCG/2ML IV SOLN
INTRAVENOUS | Status: AC
  Administered 2017-07-21: 6 ug via INTRAVENOUS
  Filled 2017-07-21: qty 4

## 2017-07-21 MED ORDER — HEPARIN SOD (PORK) LOCK FLUSH 100 UNIT/ML IV SOLN
500.0000 [IU] | INTRAVENOUS | Status: AC | PRN
  Administered 2017-07-21: 500 [IU]

## 2017-07-21 MED ORDER — COLLAGENASE 250 UNIT/GM EX OINT: TOPICAL_OINTMENT | Freq: Two times a day (BID) | CUTANEOUS | 0 refills | Status: DC

## 2017-07-21 MED ORDER — SODIUM CHLORIDE 0.9 % IV SOLN: 100.0000 mL | INTRAVENOUS | Status: DC | PRN

## 2017-07-21 MED ORDER — LIDOCAINE-PRILOCAINE 2.5-2.5 % EX CREA
1.0000 "application " | TOPICAL_CREAM | CUTANEOUS | Status: DC | PRN
  Filled 2017-07-21: qty 5

## 2017-07-21 MED ORDER — LIDOCAINE HCL (PF) 1 % IJ SOLN
5.0000 mL | INTRAMUSCULAR | Status: DC | PRN
  Filled 2017-07-21: qty 5

## 2017-07-21 MED ORDER — WARFARIN SODIUM 5 MG PO TABS
5.0000 mg | ORAL_TABLET | Freq: Once | ORAL | Status: DC
  Filled 2017-07-21: qty 1

## 2017-07-21 MED ORDER — PENTAFLUOROPROP-TETRAFLUOROETH EX AERO: 1.0000 "application " | INHALATION_SPRAY | CUTANEOUS | Status: DC | PRN

## 2017-07-21 MED ORDER — AMOXICILLIN-POT CLAVULANATE 875-125 MG PO TABS: 1.0000 | ORAL_TABLET | Freq: Two times a day (BID) | ORAL | 0 refills | Status: AC

## 2017-07-21 MED ORDER — GABAPENTIN 100 MG PO CAPS: 100.0000 mg | ORAL_CAPSULE | Freq: Every day | ORAL | 0 refills | Status: DC

## 2017-07-21 MED ORDER — CARVEDILOL 3.125 MG PO TABS: 3.1250 mg | ORAL_TABLET | Freq: Two times a day (BID) | ORAL | 0 refills | Status: DC

## 2017-07-21 MED ORDER — ALTEPLASE 2 MG IJ SOLR
2.0000 mg | Freq: Once | INTRAMUSCULAR | Status: DC | PRN
  Filled 2017-07-21: qty 2

## 2017-07-21 NOTE — Discharge Summary (Signed)
Physician Discharge Summary  Ashlee Miles IRC:[id number] DOB: 19-Dec-1940 DOA: 07/11/2017  PCP: Darlina Rumpf, MD  Admit date: 07/11/2017 Discharge date: 07/21/2017  Admitted From: Home Disposition:  SNF [place]   Recommendations for Outpatient Follow-up:  1. Follow up with PCP in 1 week 2. Follow up with Vascular Surgery Dr. Trula Slade in 2 weeks 3. Follow up with Orthopedic Surgery Dr. Marlou Sa in 2 weeks. Continue immobilization for 2 more weeks then begin therapy for range of motion exercises 4. HD MWF as your usual schedule  5. INR checks at HD  6. Wet-to-dry dressing changes daily for left thigh wound   Discharge Condition: Stable CODE STATUS: DNR  Diet recommendation: Renal   Brief/Interim Summary: Ashlee Miles is a 77 year old female with a past medical history of end-stage renal disease on hemodialysis, hypertension, hyperlipidemia, protein C deficiency, history of pituitary adenoma, history of DVT on warfarin, type 2 diabetes, wound VAC in the left lower extremity presented with increasing pain and erythema of the left upper leg. Patient recently underwent left thigh graft thrombectomy and had a wound with wound VAC. Patient was hospitalized for further management. CT scan showed fluid collection in the left lower extremity. Patient was taken to the OR on 1/25 for drainage.Cultures positive for enterococcus. Antibiotics changed after discussions with infectious disease. Currently on Unasyn and after discussion with vascular surgery, she will be discharged on amoxicillin for additional 2 weeks, last day 2/14.   Discharge Diagnoses:  Principal Problem:   Cellulitis of left lower extremity Active Problems:   Essential hypertension   DVT (deep venous thrombosis) (HCC)   ESRD on dialysis ([facility])   Anemia due to chronic kidney disease   Diabetes mellitus with end stage renal disease (HCC)   GERD (gastroesophageal reflux disease)   Protein C deficiency (HCC)   Leg wound,  left   HLD (hyperlipidemia)   Cellulitis and abscess of the left lower extremity in the setting of chronic wound and recent surgery Patient underwent vascular surgery recently to that area. She had a wound VAC in place. CT scan raised concern for fluid collection. Vascular surgery was consulted. Patient was placed on vancomycin and Zosyn. Patient taken to the operating room on 1/25 for surgical drainage. Lactic acid level was normal. Pro-calcitonin 8.26. WBC was noted to be elevated and has been normal subsequently. Cultures from the drain fluid positive for enterococcus. Discussed with infectious disease and vascular surgery. She was changed over to Unasyn. MRSA PCR has been negative previously. Blood cultures have been without any growth. Continue daily dressing changes. Amoxicillin for 2 more weeks, last day 2/14  History of DVT and protein C deficiency She was on warfarin which was reversed for surgery. Patient was placed on IV heparin. Warfarin has been resumed.   History of essential hypertension Blood pressure was noted to be low at hemodialysis. Appears to have stabilized. Dose of carvedilol was reduced. Blood pressures have stabilized. Continue to monitor. She is also noted to be on midodrine with HD.   End-stage renal disease on hemodialysis (MWF) Nephrology is following. She is being dialyzed as per her usual schedule.  Anemiaofchronic kidney disease Hemoglobin stable. No evidence of overt bleeding. Continue to monitor.  History of diabetes mellitus type 2 in the setting of end-stage renal disease Continue Lantus. CBGs are reasonably well controlled.  History of GERD Continue home medications.  Comminuted fracture left radius Seen by [facility] previously. Plan was to follow-up at the end of this month. X-ray was done which  shows stable alignment. Continue immobilization for 2 more weeks then begin therapy for range of motion  exercises   Discharge Instructions  Discharge Instructions    Change dressing (specify)   Complete by:  As directed    Dressing change: daily using wet-to-dry dressing.   Increase activity slowly   Complete by:  As directed      Allergies as of 07/21/2017   No Active Allergies     Medication List    STOP taking these medications   insulin aspart 100 UNIT/ML injection Commonly known as:  novoLOG   insulin glargine 100 UNIT/ML injection Commonly known as:  LANTUS   traMADol 50 MG tablet Commonly known as:  ULTRAM     TAKE these medications   calcitRIOL 0.5 MCG capsule Commonly known as:  ROCALTROL Take 0.5 mcg by mouth daily.   carvedilol 3.125 MG tablet Commonly known as:  COREG Take 1 tablet (3.125 mg total) by mouth 2 (two) times daily. What changed:    medication strength  how much to take   collagenase ointment Commonly known as:  SANTYL Apply topically 2 (two) times daily. Apply to L thigh wound BID. Wet-to-dry dressing on top Santyl BID   docusate sodium 100 MG capsule Commonly known as:  COLACE Take 1 capsule (100 mg total) by mouth 2 (two) times daily.   famotidine 20 MG tablet Commonly known as:  PEPCID Take 1 tablet (20 mg total) daily by mouth.   feeding supplement (PRO-STAT SUGAR FREE 64) Liqd Take 30 mLs by mouth 2 (two) times daily.   gabapentin 100 MG capsule Commonly known as:  NEURONTIN Take 1 capsule (100 mg total) by mouth at bedtime. What changed:    medication strength  how much to take   insulin aspart protamine- aspart (70-30) 100 UNIT/ML injection Commonly known as:  NOVOLOG MIX 70/30 Inject 30-40 Units into the skin See admin instructions. 40 units in the morning and 30 units in the evening or Sliding Scale   ipratropium-albuterol 0.5-2.5 (3) MG/3ML Soln Commonly known as:  DUONEB Take 3 mLs by nebulization every 4 (four) hours as needed.   linaclotide 145 MCG Caps capsule Commonly known as:  LINZESS Take 145 mcg by  mouth daily as needed.   midodrine 10 MG tablet Commonly known as:  PROAMATINE Take 1 tablet (10 mg total) by mouth every Monday, Wednesday, and Friday with hemodialysis.   multivitamin Tabs tablet Take 1 tablet by mouth at bedtime.   pantoprazole 40 MG tablet Commonly known as:  PROTONIX Take 1 tablet (40 mg total) 2 (two) times daily before a meal by mouth.   PARoxetine 10 MG tablet Commonly known as:  PAXIL Take 1 tablet (10 mg total) daily by mouth.   polyethylene glycol packet Commonly known as:  MIRALAX / GLYCOLAX Take 17 g daily by mouth.   sevelamer carbonate 800 MG tablet Commonly known as:  RENVELA Take 800 mg by mouth 3 (three) times daily with meals.   simvastatin 40 MG tablet Commonly known as:  ZOCOR Take 40 mg by mouth daily at 6 PM.   sucralfate 1 GM/10ML suspension Commonly known as:  CARAFATE Take 10 mLs (1 g total) 4 (four) times daily -  with meals and at bedtime by mouth.   thiamine 100 MG tablet Commonly known as:  VITAMIN B-1 Take 1 tablet (100 mg total) by mouth daily.   triamcinolone cream 0.1 % Commonly known as:  KENALOG Apply 1 application topically every 12 (twelve)  hours as needed.   warfarin 5 MG tablet Commonly known as:  COUMADIN Take 5 mg by mouth daily at 6 PM.            Discharge Care Instructions  (From admission, onward)        Start     Ordered   07/21/17 0000  Change dressing (specify)    Comments:  Dressing change: daily using wet-to-dry dressing.   07/21/17 0945     Follow-up Information    Darlina Rumpf, MD. Schedule an appointment as soon as possible for a visit in 1 week(s).   Specialty:  Internal Medicine Contact information: [address] [place] [address] [phone]        Serafina Mitchell, MD. Schedule an appointment as soon as possible for a visit in 2 week(s).   Specialties:  Vascular Surgery, Cardiology Contact information: [place] [address] [phone]         Meredith Pel, MD. Schedule an appointment as soon as possible for a visit in 2 week(s).   Specialty:  Orthopedic Surgery Contact information: [place] [address] [phone]          No Active Allergies  Consultations:  Vascular surgery  Nephrology    Procedures/Studies: Dg Chest 2 View  Result Date: 06/22/2017 CLINICAL DATA:  Chest pain for a while which worsened today. EXAM: CHEST  2 VIEW COMPARISON:  PA and lateral chest 03/01/2017. FINDINGS: There is cardiomegaly without edema. No pneumothorax or pleural effusion. Aortic atherosclerosis is identified. Port-A-Cath and vascular stent in the left upper arm are in place. IMPRESSION: Cardiomegaly without acute disease. Atherosclerosis. Electronically Signed   By: Inge Rise M.D.   On: 06/22/2017 16:26   Dg Wrist 2 Views Left  Result Date: 07/19/2017 CLINICAL DATA:  Fracture EXAM: LEFT WRIST - 2 VIEW COMPARISON:  Plain film of the left wrist dated 07/17/2017. FINDINGS: The distal radius fracture appears stable in alignment. Again noted is probable early callus formation but fracture lines are still clearly visible. No new osseous or soft tissue abnormality. IMPRESSION: Stable alignment/appearance of the distal radius fracture. Probable early callus formation but fracture lines are still clearly visible. Electronically Signed   By: Franki Cabot M.D.   On: 07/19/2017 19:57   Dg Wrist Complete Left  Result Date: 07/17/2017 CLINICAL DATA:  Fracture follow-up. EXAM: LEFT WRIST - COMPLETE 3+ VIEW COMPARISON:  Plain films of the left wrist dated 06/15/2017 and 04/22/2017. FINDINGS: Distal radius fracture is stable in alignment. Probable early callus formation, however, fracture lines are still clearly visible. No new osseous abnormality. IMPRESSION: Stable alignment/configuration of the distal radius fracture. Probable early callus formation but fracture lines are still clearly visible. Electronically  Signed   By: Franki Cabot M.D.   On: 07/17/2017 21:48   Ct Head Wo Contrast  Result Date: 06/22/2017 CLINICAL DATA:  PER ED NOTES: Patient transported from Short Stay after a left leg thrombectomy. After completion of procedure patient complained of difficulty swallowing. Patient alert and oriented and in no apparent distress at this time. EXAM: CT HEAD WITHOUT CONTRAST TECHNIQUE: Contiguous axial images were obtained from the base of the skull through the vertex without intravenous contrast. COMPARISON:  06/06/2017 FINDINGS: Brain: Diffuse cerebral atrophy. Mild ventricular dilatation consistent with central atrophy. Low-attenuation in the deep white matter consistent with small vessel ischemia. No mass effect or midline shift. No abnormal extra-axial fluid collections. Gray-white matter junctions are distinct. Basal cisterns are  not effaced. No acute intracranial hemorrhage. No significant changes since the prior study. Vascular: Internal carotid artery vascular calcifications are present. Skull: Normal. Negative for fracture or focal lesion. Sinuses/Orbits: No acute finding. Other: None. IMPRESSION: No acute intracranial abnormalities. Chronic atrophy and small vessel ischemic changes similar to prior study. Electronically Signed   By: Lucienne Capers M.D.   On: 06/22/2017 19:44   Ir US Guide Vasc Access Left  Result Date: 06/22/2017 INDICATION: End-stage renal disease and clotted left lower extremity graft. Patient has a wound VAC in left thigh. Patient also had a covered stent in placed in the left thigh AV graft due to a pseudoaneurysm. EXAM: DIALYSIS GRAFT DECLOT GRAFT / VENOUS PTA; ULTRASOUND GUIDANCE FOR VASCULAR ACCESS Physician: Stephan Minister. Anselm Pancoast, MD MEDICATIONS: None. ANESTHESIA/SEDATION: TPA 2 mg, Versed 6.0 mg, Fentanyl 200 mcg Moderate Sedation Time:  70 minutes The patient was continuously monitored during the procedure by the interventional radiology nurse under my direct supervision. FLUOROSCOPY  TIME:  Fluoroscopy Time: 13 minutes 24 seconds (172 mGy). COMPLICATIONS: None immediate. PROCEDURE: The procedure was explained to the patient. The risks and benefits of the procedure were discussed and the patient's questions were addressed. Informed consent was obtained from the patient. The left thigh was prepped and draped in a sterile fashion. Maximal barrier sterile technique was utilized including caps, mask, sterile gowns, sterile gloves, sterile drape, hand hygiene and skin antiseptic. The skin was anesthetized with 1% lidocaine. The graft was accessed using 21 gauge needle towards the venous anastomosis with ultrasound. The vascular access pointing towards the central veins was upsized to a 7-French vascular sheath. A 5-French catheter was advanced into the central venous structures and a central venogram was performed. 2 mg of TPA was injected through the 5 French catheter as it was pulled through the graft. Fluoroscopic images were saved for documentation. A Bentson wire was placed centrally and the graft was treated with the AngioJet thrombectomy device. The venous anastomosis and medial limb were angioplastied with a 6 mm x 40 mm balloon. 21 gauge needle was directed into the graft towards the arterial anastomosis with ultrasound guidance. Micropuncture dilator set was placed and a 6 [place] vascular sheath was placed. A Glidewire and Fogarty balloon were advanced across arterial anastomosis. The arterial plug was pulled using the 5 [place] Fogarty balloon. There was flow in the graft. The arterial plug was pulled 2 times. Follow-up angiogram demonstrated residual clot at the apex of the graft. The graft was treated a second time with the Angiojet thrombectomy device. Follow-up angiogram demonstrated critical residual stenosis along the medial limb of the graft. This area was treated again with a 6 mm x 40 mm balloon. Follow-up angiogram images demonstrated residual stenosis along the medial limb. The  medial limb and venous anastomosis were subsequently treated with a 7 mm x 40 mm Conquest balloon. Follow-up angiogram images were obtained. Five French catheter was advanced across the arterial anastomosis and an angiogram was performed in order to evaluate the anastomosis and lateral limb of the graft. The vascular sheaths were removed with purse string sutures. No immediate complication. FINDINGS: Occlusion of the left thigh loop graft. Patient has a covered stent along the medial aspect of the graft. Long segment stenosis along the medial limb of the graft required multiple angioplasties with 6 mm and 7 mm balloons. The graft is widely patent and flowing well at the end of the procedure. Mild residual stenosis along the medial limb of the graft at the end  of the procedure. IMPRESSION: Successful declot of the left lower extremity graft. PLAN: The left thigh graft remains amenable to percutaneous intervention. Electronically Signed   By: Markus Daft M.D.   On: 06/22/2017 14:20   Ir Thrombectomy Av Fistula W/thrombolysis/pta Inc/shunt/img Left  Result Date: 06/22/2017 INDICATION: End-stage renal disease and clotted left lower extremity graft. Patient has a wound VAC in left thigh. Patient also had a covered stent in placed in the left thigh AV graft due to a pseudoaneurysm. EXAM: DIALYSIS GRAFT DECLOT GRAFT / VENOUS PTA; ULTRASOUND GUIDANCE FOR VASCULAR ACCESS Physician: Stephan Minister. Anselm Pancoast, MD MEDICATIONS: None. ANESTHESIA/SEDATION: TPA 2 mg, Versed 6.0 mg, Fentanyl 200 mcg Moderate Sedation Time:  70 minutes The patient was continuously monitored during the procedure by the interventional radiology nurse under my direct supervision. FLUOROSCOPY TIME:  Fluoroscopy Time: 13 minutes 24 seconds (172 mGy). COMPLICATIONS: None immediate. PROCEDURE: The procedure was explained to the patient. The risks and benefits of the procedure were discussed and the patient's questions were addressed. Informed consent was obtained from  the patient. The left thigh was prepped and draped in a sterile fashion. Maximal barrier sterile technique was utilized including caps, mask, sterile gowns, sterile gloves, sterile drape, hand hygiene and skin antiseptic. The skin was anesthetized with 1% lidocaine. The graft was accessed using 21 gauge needle towards the venous anastomosis with ultrasound. The vascular access pointing towards the central veins was upsized to a 7-French vascular sheath. A 5-French catheter was advanced into the central venous structures and a central venogram was performed. 2 mg of TPA was injected through the 5 French catheter as it was pulled through the graft. Fluoroscopic images were saved for documentation. A Bentson wire was placed centrally and the graft was treated with the AngioJet thrombectomy device. The venous anastomosis and medial limb were angioplastied with a 6 mm x 40 mm balloon. 21 gauge needle was directed into the graft towards the arterial anastomosis with ultrasound guidance. Micropuncture dilator set was placed and a 6 [place] vascular sheath was placed. A Glidewire and Fogarty balloon were advanced across arterial anastomosis. The arterial plug was pulled using the 5 [place] Fogarty balloon. There was flow in the graft. The arterial plug was pulled 2 times. Follow-up angiogram demonstrated residual clot at the apex of the graft. The graft was treated a second time with the Angiojet thrombectomy device. Follow-up angiogram demonstrated critical residual stenosis along the medial limb of the graft. This area was treated again with a 6 mm x 40 mm balloon. Follow-up angiogram images demonstrated residual stenosis along the medial limb. The medial limb and venous anastomosis were subsequently treated with a 7 mm x 40 mm Conquest balloon. Follow-up angiogram images were obtained. Five French catheter was advanced across the arterial anastomosis and an angiogram was performed in order to evaluate the anastomosis and  lateral limb of the graft. The vascular sheaths were removed with purse string sutures. No immediate complication. FINDINGS: Occlusion of the left thigh loop graft. Patient has a covered stent along the medial aspect of the graft. Long segment stenosis along the medial limb of the graft required multiple angioplasties with 6 mm and 7 mm balloons. The graft is widely patent and flowing well at the end of the procedure. Mild residual stenosis along the medial limb of the graft at the end of the procedure. IMPRESSION: Successful declot of the left lower extremity graft. PLAN: The left thigh graft remains amenable to percutaneous intervention. Electronically Signed   By: Quita Skye  Anselm Pancoast M.D.   On: 06/22/2017 14:20   Ct Extremity Lower Left Wo Contrast  Result Date: 07/12/2017 CLINICAL DATA:  Wound in the left thigh EXAM: CT OF THE LOWER LEFT EXTREMITY WITHOUT CONTRAST TECHNIQUE: Multidetector CT imaging of the lower left extremity was performed according to the standard protocol. COMPARISON:  None. FINDINGS: Bones/Joint/Cartilage Mild SI joint disease. Left pubic symphysis and rami appears intact. The left femur demonstrates no fracture or malalignment. No abnormal lucency, periostitis or bone destruction. Mild lateral subluxation of the patella with patellofemoral degenerative changes. Moderate arthritis of the medial and lateral compartments of the left knee. No large left hip effusion or knee effusion. Loop graft in the left thigh with anastomosis at the left femoral vessels. Ligaments Suboptimally assessed by CT. Muscles and Tendons Quadriceps tendon and patellar tendons appear intact. No intramuscular fluid collections are seen. Slightly indistinct appearance of the distal medial vastus muscle. Soft tissues Skin thickening and moderate subcutaneous edema and soft tissue stranding involving the medial aspect of the mid to distal thigh. Wound within the medial thigh soft tissues with small foci of gas and radiopaque  material, likely packing material. Dominant fluid collection within the medial subcutaneous fat of the distal thigh just above the knee, this measures 8.2 x 2.9 cm. Vascular calcifications. IMPRESSION: 1. Deep wound within the soft tissues of the mid to distal medial thigh. Skin thickening and moderate edema and inflammation of the subcutaneous fat of the mid to distal medial thigh consistent with cellulitis. Indistinct appearance of the distal medial vastus muscle likely representing muscle edema/myositis. Dominant fluid collection within the distal thigh subcutaneous fat, just above the knee measuring 8.2 cm which may represent an inflammatory or infected fluid collection. No bone destruction, periostitis or abnormal lucency to suggest osteomyelitis by CT. 2. Arthritis of the left knee with mild lateral subluxation of the patella Electronically Signed   By: Donavan Foil M.D.   On: 07/12/2017 01:28        Discharge Exam: Vitals:   07/21/17 0900 07/21/17 0930  BP: (!) 120/44 (!) 151/67  Pulse: 61 72  Resp:    Temp:    SpO2:     Vitals:   07/21/17 0800 07/21/17 0830 07/21/17 0900 07/21/17 0930  BP: (!) 136/59 (!) 122/41 (!) 120/44 (!) 151/67  Pulse: 61 72 61 72  Resp:      Temp:      TempSrc:      SpO2:      Weight:      Height:        General: Pt is alert, awake, not in acute distress Cardiovascular: RRR, S1/S2 +, no rubs, no gallops Respiratory: CTA bilaterally, no wheezing, no rhonchi Abdominal: Soft, NT, ND, bowel sounds + Extremities: no edema, no cyanosis    The results of significant diagnostics from this hospitalization (including imaging, microbiology, ancillary and laboratory) are listed below for reference.     Microbiology: Recent Results (from the past 240 hour(s))  Culture, blood (routine x 2)     Status: None   Collection Time: 07/12/17 10:40 AM  Result Value Ref Range Status   Specimen Description BLOOD RIGHT ARM  Final   Special Requests   Final     BOTTLES DRAWN AEROBIC AND ANAEROBIC Blood Culture adequate volume   Culture NO GROWTH 5 DAYS  Final   Report Status 07/17/2017 FINAL  Final  Culture, blood (routine x 2)     Status: None   Collection Time: 07/12/17 10:50 AM  Result Value Ref Range Status   Specimen Description BLOOD RIGHT HAND  Final   Special Requests   Final    BOTTLES DRAWN AEROBIC AND ANAEROBIC Blood Culture adequate volume   Culture NO GROWTH 5 DAYS  Final   Report Status 07/17/2017 FINAL  Final  MRSA PCR Screening     Status: None   Collection Time: 07/13/17  9:29 AM  Result Value Ref Range Status   MRSA by PCR NEGATIVE NEGATIVE Final    Comment:        The GeneXpert MRSA Assay (FDA approved for NASAL specimens only), is one component of a comprehensive MRSA colonization surveillance program. It is not intended to diagnose MRSA infection nor to guide or monitor treatment for MRSA infections.   Aerobic/Anaerobic Culture (surgical/deep wound)     Status: None   Collection Time: 07/14/17  8:11 AM  Result Value Ref Range Status   Specimen Description ABSCESS LEFT UPPER THIGH  Final   Special Requests SWAB SPEC A  Final   Gram Stain   Final    MODERATE WBC PRESENT, PREDOMINANTLY PMN NO ORGANISMS SEEN    Culture FEW ENTEROCOCCUS FAECALIS NO ANAEROBES ISOLATED   Final   Report Status 07/19/2017 FINAL  Final   Organism ID, Bacteria ENTEROCOCCUS FAECALIS  Final      Susceptibility   Enterococcus faecalis - MIC*    AMPICILLIN <=2 SENSITIVE Sensitive     VANCOMYCIN 2 SENSITIVE Sensitive     GENTAMICIN SYNERGY SENSITIVE Sensitive     * FEW ENTEROCOCCUS FAECALIS     Labs: BNP (last 3 results) Recent Labs    04/05/17 1947  BNP 409.8*   Basic Metabolic Panel: Recent Labs  Lab 07/14/17 1740 07/17/17 0822 07/18/17 0418 07/19/17 1153 07/20/17 0306 07/21/17 0403  NA 135 132* 135 132* 136 136  K 3.3* 3.9 3.9 3.5 3.8 3.5  CL 96* 94* 98* 94* 98* 98*  CO2 28 23 25 23 26 27   GLUCOSE 187* 115* 99 168*  164* 148*  BUN 7 38* 23* 33* 13 23*  CREATININE 2.71* 7.49* 5.44* 7.66* 4.62* 6.68*  CALCIUM 7.4* 7.6* 7.4* 7.8* 7.7* 7.7*  PHOS 2.1* 4.8* 3.4 5.1*  --   --    Liver Function Tests: Recent Labs  Lab 07/14/17 1740 07/17/17 0822 07/18/17 0418 07/19/17 1153  ALBUMIN 2.9* 2.6* 2.6* 2.8*   No results for input(s): LIPASE, AMYLASE in the last 168 hours. No results for input(s): AMMONIA in the last 168 hours. CBC: Recent Labs  Lab 07/17/17 0359 07/18/17 0418 07/19/17 1153 07/20/17 0306 07/21/17 0403  WBC 8.0 8.5 7.1 5.9 5.9  HGB 10.2* 10.4* 10.4* 9.7* 10.0*  HCT 31.9* 32.1* 32.6* 31.9* 31.5*  MCV 81.4 82.1 81.1 82.6 82.0  PLT 202 202 293 277 291   Cardiac Enzymes: No results for input(s): CKTOTAL, CKMB, CKMBINDEX, TROPONINI in the last 168 hours. BNP: Invalid input(s): POCBNP CBG: Recent Labs  Lab 07/19/17 2154 07/20/17 0807 07/20/17 1213 07/20/17 1711 07/20/17 2119  GLUCAP 190* 126* 198* 223* 176*   D-Dimer No results for input(s): DDIMER in the last 72 hours. Hgb A1c No results for input(s): HGBA1C in the last 72 hours. Lipid Profile No results for input(s): CHOL, HDL, LDLCALC, TRIG, CHOLHDL, LDLDIRECT in the last 72 hours. Thyroid function studies No results for input(s): TSH, T4TOTAL, T3FREE, THYROIDAB in the last 72 hours.  Invalid input(s): FREET3 Anemia work up No results for input(s): VITAMINB12, FOLATE, FERRITIN, TIBC, IRON, RETICCTPCT in the last  72 hours. Urinalysis    Component Value Date/Time   COLORURINE AMBER (A) 06/06/2017 1636   APPEARANCEUR CLOUDY (A) 06/06/2017 1636   LABSPEC 1.018 06/06/2017 1636   PHURINE 5.0 06/06/2017 1636   GLUCOSEU NEGATIVE 06/06/2017 1636   HGBUR SMALL (A) 06/06/2017 1636   BILIRUBINUR NEGATIVE 06/06/2017 1636   KETONESUR NEGATIVE 06/06/2017 1636   PROTEINUR 100 (A) 06/06/2017 1636   NITRITE NEGATIVE 06/06/2017 1636   LEUKOCYTESUR NEGATIVE 06/06/2017 1636   Sepsis Labs Invalid input(s): PROCALCITONIN,  WBC,   LACTICIDVEN Microbiology Recent Results (from the past 240 hour(s))  Culture, blood (routine x 2)     Status: None   Collection Time: 07/12/17 10:40 AM  Result Value Ref Range Status   Specimen Description BLOOD RIGHT ARM  Final   Special Requests   Final    BOTTLES DRAWN AEROBIC AND ANAEROBIC Blood Culture adequate volume   Culture NO GROWTH 5 DAYS  Final   Report Status 07/17/2017 FINAL  Final  Culture, blood (routine x 2)     Status: None   Collection Time: 07/12/17 10:50 AM  Result Value Ref Range Status   Specimen Description BLOOD RIGHT HAND  Final   Special Requests   Final    BOTTLES DRAWN AEROBIC AND ANAEROBIC Blood Culture adequate volume   Culture NO GROWTH 5 DAYS  Final   Report Status 07/17/2017 FINAL  Final  MRSA PCR Screening     Status: None   Collection Time: 07/13/17  9:29 AM  Result Value Ref Range Status   MRSA by PCR NEGATIVE NEGATIVE Final    Comment:        The GeneXpert MRSA Assay (FDA approved for NASAL specimens only), is one component of a comprehensive MRSA colonization surveillance program. It is not intended to diagnose MRSA infection nor to guide or monitor treatment for MRSA infections.   Aerobic/Anaerobic Culture (surgical/deep wound)     Status: None   Collection Time: 07/14/17  8:11 AM  Result Value Ref Range Status   Specimen Description ABSCESS LEFT UPPER THIGH  Final   Special Requests SWAB SPEC A  Final   Gram Stain   Final    MODERATE WBC PRESENT, PREDOMINANTLY PMN NO ORGANISMS SEEN    Culture FEW ENTEROCOCCUS FAECALIS NO ANAEROBES ISOLATED   Final   Report Status 07/19/2017 FINAL  Final   Organism ID, Bacteria ENTEROCOCCUS FAECALIS  Final      Susceptibility   Enterococcus faecalis - MIC*    AMPICILLIN <=2 SENSITIVE Sensitive     VANCOMYCIN 2 SENSITIVE Sensitive     GENTAMICIN SYNERGY SENSITIVE Sensitive     * FEW ENTEROCOCCUS FAECALIS     Time coordinating discharge: 45 minutes  SIGNED:  Dessa Phi, DO [facility] Pager [phone]  If 7PM-7AM, please contact night-coverage [email or web address] Password [facility] 07/21/2017, 9:47 AM

## 2017-07-21 NOTE — Clinical Social Work Placement (Signed)
   CLINICAL SOCIAL WORK PLACEMENT  NOTE  Date:  07/21/2017  Patient Details  Name: Ashlee Miles MRN: [id number] Date of Birth: [birth year]  Clinical Social Work is seeking post-discharge placement for this patient at the [place] level of care (*CSW will initial, date and re-position this form in  chart as items are completed):  Yes   Patient/family provided with [place] Work Department's list of facilities offering this level of care within the geographic area requested by the patient (or if unable, by the patient's family).  Yes   Patient/family informed of their freedom to choose among providers that offer the needed level of care, that participate in Medicare, Medicaid or managed care program needed by the patient, have an available bed and are willing to accept the patient.  Yes   Patient/family informed of [place]'s ownership interest in [facility] and [facility], as well as of the fact that they are under no obligation to receive care at these facilities.  PASRR submitted to EDS on       PASRR number received on       Existing PASRR number confirmed on 07/19/17     FL2 transmitted to all facilities in geographic area requested by pt/family on 07/19/17     FL2 transmitted to all facilities within larger geographic area on       Patient informed that his/her managed care company has contracts with or will negotiate with certain facilities, including the following:        Yes   Patient/family informed of bed offers received.  Patient chooses bed at [facility]     Physician recommends and patient chooses bed at      Patient to be transferred to [facility] on 07/21/17.  Patient to be transferred to facility by [facility]     Patient family notified on 07/21/17 of transfer.  Name of family member notified:  Daughter     PHYSICIAN Please sign DNR     Additional Comment:     _______________________________________________ Benard Halsted, [place] 07/21/2017, 1:25 PM

## 2017-07-21 NOTE — Progress Notes (Signed)
ANTICOAGULATION CONSULT NOTE - Follow Up Consult  Pharmacy Consult for warfarin Indication: history of DVT, protein C deficiency  No Active Allergies  Patient Measurements: Height: 5\' 2"  (157.5 cm) Weight: 215 lb 13.3 oz (97.9 kg) IBW/kg (Calculated) : 50.1 Heparin Dosing Weight: 73 kg  Labs: Recent Labs    07/19/17 0413  07/19/17 1153 07/20/17 0306 07/21/17 0403  HGB  --    < > 10.4* 9.7* 10.0*  HCT  --   --  32.6* 31.9* 31.5*  PLT  --   --  293 277 291  LABPROT 28.1*  --   --  29.4* 29.9*  INR 2.66  --   --  2.81 2.87  CREATININE  --   --  7.66* 4.62* 6.68*   < > = values in this interval not displayed.    Estimated Creatinine Clearance: 7.8 mL/min (A) (by C-G formula based on SCr of 6.68 mg/dL (H)).   Assessment: 77 yo F on Coumadin PTA for hx of DVT and protein C deficiency.  Patient was admitted with LLE cellulitis> CT scan noted fluid collection on LLE concerning for abscess / infection with plans for drainage, so INR was reversed for procedure and IV heparin initiated. Now patient is s/p I&D on 1/25 with heparin and warfarin resumed post-op.  Heparin stopped 1/28 once INR>2/  Home dose of warfarin was 5mg  daily.  INR today remains therapeutic (2.87 << 2.81, goal of 2-3). CBC stable. Noted plans for discharge today.  Goal of Therapy:  INR = 2 to 3 Monitor platelets by anticoagulation protocol: Yes   Plan:  1. Warfarin 5 mg x 1 dose at 1800 today (if still here) 2. At discharge - can resume PTA dose of 5 mg/day 3. Will continue to monitor for any signs/symptoms of bleeding and will follow up with PT/INR in the a.m. (if still here)  Thank you for allowing pharmacy to be a part of this patient's care.  Alycia Rossetti, PharmD, BCPS Clinical Pharmacist Pager: [phone] Clinical phone for 07/21/2017 from 7a-3:30p: [phone] If after 3:30p, please call main pharmacy at: [phone] 07/21/2017 10:18 AM

## 2017-07-21 NOTE — Progress Notes (Signed)
PT Cancellation Note  Patient Details Name: Ashlee Miles MRN: [id number] DOB: 1940/12/04   Cancelled Treatment:    Reason Eval/Treat Not Completed: Patient declined, no reason specified Patient declines PT today, reports she is being discharged this afternoon and would like to focus on preparing to leave with nursing staff.    Deniece Ree PT, DPT, CBIS  Supplemental Physical Therapist [facility]   Pager [phone]

## 2017-07-21 NOTE — Care Management Important Message (Signed)
Important Message  Patient Details  Name: Maclaine Ahola MRN: [id number] Date of Birth: 01/24/1941   Medicare Important Message Given:  Yes    Dewie Ahart P Mill[place] 07/21/2017, 2:45 PM

## 2017-07-21 NOTE — Progress Notes (Signed)
Patient sent to dialysis

## 2017-07-21 NOTE — Telephone Encounter (Signed)
I saw her in hospital 

## 2017-07-21 NOTE — Progress Notes (Signed)
Patient seen in dialysis today.  The dressing was not changed.  I discussed continuing with wet-to-dry daily dressing changes versus using a wound VAC.  The patient prefers to do wet-to-dry dressing changes.  I will have her scheduled for follow-up in 2 weeks.  She is stable for discharge from my perspective.  We discussed continuing antibiotics for another 2 weeks  [place] Nyemah [place]

## 2017-07-21 NOTE — Telephone Encounter (Signed)
Sched appt 08/07/17 at 3:15 with [facility]. Spoke to pt's daughter. Pt has all day dialysis MWF. resched appt to 08/08/17 at 1:15 with the NP.

## 2017-07-21 NOTE — Procedures (Signed)
I have personally attended this patient's dialysis session.   No AVG issues, cannulationof lateral side only 4K bath for K 3.5 Post weight 97.9 kg  Lower EDW to 97.5 for discharge  Jamal Maes, MD [place] Pager 07/21/2017, 12:01 PM

## 2017-07-21 NOTE — Progress Notes (Signed)
Patient will DC to: [facility] Anticipated DC date: 07/21/17 Family notified: Daughter Transport by: Corey Harold   Per MD patient ready for DC to [facility]. RN, patient, patient's family, and facility notified of DC. Discharge Summary sent to facility. RN given number for report [phone]). DC packet on chart. Ambulance transport requested for patient.   CSW signing off.  Cedric Fishman, [place] Social Worker [phone]

## 2017-07-21 NOTE — Telephone Encounter (Signed)
-----   Message from Mena Goes, RN sent at 07/21/2017  9:51 AM EST ----- Regarding: 2-3 weeks postop   ----- Message ----- From: Serafina Mitchell, MD Sent: 07/21/2017   9:38 AM To: [facility] Charge Pool  07/21/2017:   Follow-up 2 - 3 weeks for wound check

## 2017-07-21 NOTE — Plan of Care (Signed)
  Progressing Pain Managment: General experience of comfort will improve 07/21/2017 0451 - Progressing by Verne Grain, RN Continues to take percocet prn for moderate pain. Skin Integrity: Risk for impaired skin integrity will decrease 07/21/2017 0451 - Progressing by Verne Grain, RN Left thigh wound dressing remains clean dry intact, patient wanted to wait until MD assesses the site in morning before changing again.

## 2017-07-21 NOTE — Progress Notes (Addendum)
[facility] Progress Note   Subjective:   Seen while on HD. No new complaints. Tolerating dialysis well.   Objective Vitals:   07/20/17 1415 07/20/17 2122 07/20/17 2122 07/21/17 0532  BP: (!) 137/47 (!) 135/43 (!) 135/43 (!) 133/45  Pulse: 79 69 69 71  Resp: 18 18 18 18   Temp: 98.1 F (36.7 C) 98.2 F (36.8 C) 98.2 F (36.8 C) 98.1 F (36.7 C)  TempSrc: Oral Oral Oral Oral  SpO2: 100% 100% 100% 99%  Weight:    98.8 kg (217 lb 13 oz)  Height:       Physical Exam General:NAD, obese female Heart:RRR, no rub or gallop Lungs:CTAB, decreased BS Abdomen:soft, NTND Extremities:no edeme in b/l LE. LLE wrapped in ace bandage from mid thigh to calf. Dialysis Access: L thigh AVG cannulated  Filed Weights   07/19/17 1130 07/20/17 0650 07/21/17 0532  Weight: 98.5 kg (217 lb 2.5 oz) 97.3 kg (214 lb 8.1 oz) 98.8 kg (217 lb 13 oz)    Intake/Output Summary (Last 24 hours) at 07/21/2017 0805 Last data filed at 07/21/2017 0600 Gross per 24 hour  Intake 580 ml  Output 200 ml  Net 380 ml    Additional Objective  Recent Labs  Lab 07/17/17 0822 07/18/17 0418 07/19/17 1153 07/20/17 0306 07/21/17 0403  NA 132* 135 132* 136 136  K 3.9 3.9 3.5 3.8 3.5  CL 94* 98* 94* 98* 98*  CO2 23 25 23 26 27   GLUCOSE 115* 99 168* 164* 148*  BUN 38* 23* 33* 13 23*  CREATININE 7.49* 5.44* 7.66* 4.62* 6.68*  CALCIUM 7.6* 7.4* 7.8* 7.7* 7.7*  PHOS 4.8* 3.4 5.1*  --   --    Liver Function Tests: Recent Labs  Lab 07/17/17 0822 07/18/17 0418 07/19/17 1153  ALBUMIN 2.6* 2.6* 2.8*    Recent Labs  Lab 07/17/17 0359 07/18/17 0418 07/19/17 1153 07/20/17 0306 07/21/17 0403  WBC 8.0 8.5 7.1 5.9 5.9  HGB 10.2* 10.4* 10.4* 9.7* 10.0*  HCT 31.9* 32.1* 32.6* 31.9* 31.5*  MCV 81.4 82.1 81.1 82.6 82.0  PLT 202 202 293 277 291   Blood Culture    Component Value Date/Time   SDES ABSCESS LEFT UPPER THIGH 07/14/2017 0811   SPECREQUEST SWAB SPEC A 07/14/2017 0811   CULT FEW  ENTEROCOCCUS FAECALIS NO ANAEROBES ISOLATED  07/14/2017 0811   REPTSTATUS 07/19/2017 FINAL 07/14/2017 0811   CBG: Recent Labs  Lab 07/19/17 2154 07/20/17 0807 07/20/17 1213 07/20/17 1711 07/20/17 2119  GLUCAP 190* 126* 198* 223* 176*    Lab Results  Component Value Date   INR 2.87 07/21/2017   INR 2.81 07/20/2017   INR 2.66 07/19/2017   Studies/Results: Dg Wrist 2 Views Left  Result Date: 07/19/2017 CLINICAL DATA:  Fracture EXAM: LEFT WRIST - 2 VIEW COMPARISON:  Plain film of the left wrist dated 07/17/2017. FINDINGS: The distal radius fracture appears stable in alignment. Again noted is probable early callus formation but fracture lines are still clearly visible. No new osseous or soft tissue abnormality. IMPRESSION: Stable alignment/appearance of the distal radius fracture. Probable early callus formation but fracture lines are still clearly visible. Electronically Signed   By: Franki Cabot M.D.   On: 07/19/2017 19:57    Medications: . sodium chloride    . sodium chloride    . ampicillin-sulbactam (UNASYN) IV Stopped (07/20/17 2142)   . carvedilol  3.125 mg Oral BID  . cinacalcet  30 mg Oral Q supper  . collagenase  Topical BID  . darbepoetin (ARANESP) injection - DIALYSIS  60 mcg Intravenous Q Mon-HD  . docusate sodium  100 mg Oral BID  . doxercalciferol  6 mcg Intravenous Q M,W,F-HD  . famotidine  20 mg Oral Daily  . feeding supplement (PRO-STAT SUGAR FREE 64)  30 mL Oral BID  . gabapentin  100 mg Oral QHS  . insulin aspart  0-9 Units Subcutaneous TID WC  . insulin glargine  3 Units Subcutaneous QHS  . midodrine  10 mg Oral Q M,W,F-HD  . multivitamin  1 tablet Oral QHS  . pantoprazole  40 mg Oral BID AC  . PARoxetine  10 mg Oral Daily  . polyethylene glycol  17 g Oral Daily  . simvastatin  40 mg Oral q1800  . sucralfate  1 g Oral TID WC & HS  . thiamine  100 mg Oral Daily  . Warfarin - Pharmacist Dosing Inpatient   Does not apply q1800    Dialysis  Orders: MWF -NW 4h  98.5kg EDW to 97.5 for discharge 3k?2.25 bath  L thigh AVG  Hep 3000 Hectorol 20mcg IV qHD TIW Venofer 100mg  IV qHD mircera 165mcg IV q2wks - no longer ordered, last 1/11 hgb 12 (was on 225 prior to that)   Assessment/Plan: 1. Left leg cellulitis/abscess below AVG - s/p abscess drainage, WC + enterococcus faecalis. BC NGTD. On Unasyn >PO amoxicillin when ready for d/c per Dr. Trula Slade. 2. Vascular access - using L thigh AVG (only cannulating the lateral side) 3. ESRD - HD today per regular schedule. Continue MWF. K 3.5 lower edw to 97.5 kg 4. Anemia of CKD- Hgb 10.0. Holding iron d/t active infection. Aranesp 50mcg IV qwk. (Thurs - 1st 1/28) 5. Secondary hyperparathyroidism - Labs in goal.  Last P 5.1. Continue to monitor. May need to restart binders at lower dose. Continue sensipar & hectorol. 6. HTN/volume - BP controlled. Does not appear volume overloaded. Goal 1.5L. Will likely need lower EDW on d/c.  7. Nutrition - renal diet with fluid restrictions. Nepro TID. 8. H/o DVT/protein C deficiency - on warfarin. Lifelong anticoagulation. Per pharm. 9. GERD - on carafate, should be limited d/t aluminum content 10. L distal radius Frx - X ray show little change in alignment & possible early callus formation, immobilization x2wks then start ROM exercise per Ortho Dr. Sammuel Bailiff, PA-C [facility] Pager: [phone] 07/21/2017,8:05 AM  LOS: 9 days    I have seen and examined this patient and agree with plan and assessment in the above note with renal recommendations/intervention highlighted. She is felt to be stable for discharge per [facility]. Plan is for wet to dry dressings rather than replacing wound VAC. 2 more weeks of antibiotics (po amoxicillin)  Amaya Blakeman B,MD 07/21/2017 11:58 AM

## 2017-08-02 DIAGNOSIS — Z992 Dependence on renal dialysis: Secondary | ICD-10-CM | POA: Insufficient documentation

## 2017-08-07 ENCOUNTER — Ambulatory Visit: Payer: 59 | Admitting: Surgery

## 2017-08-08 ENCOUNTER — Ambulatory Visit: Payer: 59 | Admitting: Family

## 2017-08-08 DIAGNOSIS — E8770 Fluid overload, unspecified: Secondary | ICD-10-CM | POA: Insufficient documentation

## 2017-08-09 LAB — HEPATITIS B SURFACE ANTIGEN: Hepatitis B Surface Ag: NEGATIVE

## 2017-08-22 ENCOUNTER — Encounter: Payer: Self-pay | Admitting: Family

## 2017-08-22 ENCOUNTER — Ambulatory Visit (INDEPENDENT_AMBULATORY_CARE_PROVIDER_SITE_OTHER): Payer: 59 | Admitting: Family

## 2017-08-22 VITALS — BP 140/51 | HR 72 | Temp 98.8°F | Resp 18 | Ht 62.0 in | Wt 210.0 lb

## 2017-08-22 DIAGNOSIS — N186 End stage renal disease: Secondary | ICD-10-CM

## 2017-08-22 DIAGNOSIS — T1490XD Injury, unspecified, subsequent encounter: Secondary | ICD-10-CM

## 2017-08-22 DIAGNOSIS — Z992 Dependence on renal dialysis: Secondary | ICD-10-CM

## 2017-08-22 NOTE — Progress Notes (Signed)
Postoperative Access Visit   History of Present Illness  Ashlee Miles is a 77 y.o. year old female who is s/p I&D of abscess left thigh and excisional debridement of wound left thigh (subcutaneous tissue) on 07-14-17 by Dr. Scot Dock for abscess of left thigh. On 06-02-17 she had an evacuation of left thigh hematoma, application of wound VAC, and placement of antibiotic coated beads by Dr. Trula Slade for left thigh hematoma.        Approximately November 2018 the patient developed a hematoma in the left thigh.  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  She presented to the hospital with an acute change approximately 2 days prior to presenting to the hospital.  She was having significant pain in her left thigh.  She is on Coumadin for hypercoagulable state and DVT.  This was reversed and she was brought to the operating room for exploration.  She had received multiple units of blood the previous night for drop in her hematocrit.  She returns today for wound check, it appears that she missed her scheduled follow up with Dr. Trula Slade on 08-07-17.  She denies fever or chills. [facility] is changing left medial thigh wound dressing 5-6 days/week.  Daughter states the wound looks much better.  Pt dialyzes M-W-F via left anterior thigh AV graft at [facility].   The patient is able to complete their activities of daily living.   For VQI Use Only  PRE-ADM LIVING: Home  AMB STATUS: Wheelchair   Past Medical History:  Diagnosis Date  . CHF (congestive heart failure) ([place])   . Diabetes mellitus without complication ([address])   . ESRD (end stage renal disease) on dialysis ([place])   . History of pituitary adenoma   . Hypertension   . Protein C deficiency [facility])     Past Surgical History:  Procedure Laterality Date  . AV FISTULA PLACEMENT Left 03/16/2017   Procedure: INSERTION OF ARTERIOVENOUS (AV) GORE-TEX GRAFT THIGH-LEFT;   Surgeon: Angelia Mould, MD;  Location: [place];  Service: Vascular;  Laterality: Left;  . CHOLECYSTECTOMY    . DIALYSIS FISTULA CREATION     clotted off  . FALSE ANEURYSM REPAIR Left 05/10/2017   Procedure: REPAIR FALSE ANEURYSM left thigh AVGG using Gore 62mmx5cm Viabahn Endoprosthesis;  Surgeon: Serafina Mitchell, MD;  Location: [place];  Service: Vascular;  Laterality: Left;  . I&D EXTREMITY Left 07/14/2017   Procedure: IRRIGATION AND DEBRIDEMENT DISTAL THIGH ABSCESS;  Surgeon: Angelia Mould, MD;  Location: [place];  Service: Vascular;  Laterality: Left;  . INSERTION OF DIALYSIS CATHETER Right 03/16/2017   Procedure: INSERTION OF RIGHT FEMORAL TUNNELED DIALYSIS CATHETER;  Surgeon: Angelia Mould, MD;  Location: [facility] OR;  Service: Vascular;  Laterality: Right;  . IR THROMBECTOMY AV FISTULA W/THROMBOLYSIS/PTA INC/SHUNT/IMG LEFT Left 06/22/2017  . IR US GUIDE VASC ACCESS LEFT  06/22/2017  . IVC FILTER INSERTION    . PORTACATH PLACEMENT    . THROMBECTOMY AND REVISION OF ARTERIOVENTOUS (AV) GORETEX  GRAFT Left 06/02/2017   Procedure: EVACUATION OF LEFT THIGH HEMATOMA  AND APPLICATION OF WOUND VAC;  Surgeon: Serafina Mitchell, MD;  Location: [facility] OR;  Service: Vascular;  Laterality: Left;    Social History   Socioeconomic History  . Marital status: Married    Spouse name: Not on file  . Number of children: Not on file  . Years of education: Not on file  .  Highest education level: Not on file  Social Needs  . Financial resource strain: Not on file  . Food insecurity - worry: Not on file  . Food insecurity - inability: Not on file  . Transportation needs - medical: Not on file  . Transportation needs - non-medical: Not on file  Occupational History  . Occupation: retired  Tobacco Use  . Smoking status: Never Smoker  . Smokeless tobacco: Never Used  Substance and Sexual Activity  . Alcohol use: No  . Drug use: No  . Sexual activity: Not on file  Other Topics Concern  . Not  on file  Social History Narrative  . Not on file   No Active Allergies  Current Outpatient Medications on File Prior to Visit  Medication Sig Dispense Refill  . Amino Acids-Protein Hydrolys (FEEDING SUPPLEMENT, PRO-STAT SUGAR FREE 64,) LIQD Take 30 mLs by mouth 2 (two) times daily. 900 mL 0  . calcitRIOL (ROCALTROL) 0.5 MCG capsule Take 0.5 mcg by mouth daily.    . collagenase (SANTYL) ointment Apply topically 2 (two) times daily. Apply to L thigh wound BID. Wet-to-dry dressing on top Santyl BID 15 g 0  . docusate sodium (COLACE) 100 MG capsule Take 1 capsule (100 mg total) by mouth 2 (two) times daily. 10 capsule 0  . famotidine (PEPCID) 20 MG tablet Take 1 tablet (20 mg total) daily by mouth. 30 tablet 0  . gabapentin (NEURONTIN) 100 MG capsule Take 1 capsule (100 mg total) by mouth at bedtime. 30 capsule 0  . insulin aspart protamine- aspart (NOVOLOG MIX 70/30) (70-30) 100 UNIT/ML injection Inject 30-40 Units into the skin See admin instructions. 40 units in the morning and 30 units in the evening or Sliding Scale    . ipratropium-albuterol (DUONEB) 0.5-2.5 (3) MG/3ML SOLN Take 3 mLs by nebulization every 4 (four) hours as needed. 360 mL 0  . linaclotide (LINZESS) 145 MCG CAPS capsule Take 145 mcg by mouth daily as needed.     . multivitamin (RENA-VIT) TABS tablet Take 1 tablet by mouth at bedtime. 30 tablet 0  . pantoprazole (PROTONIX) 40 MG tablet Take 1 tablet (40 mg total) 2 (two) times daily before a meal by mouth. 60 tablet 0  . PARoxetine (PAXIL) 10 MG tablet Take 1 tablet (10 mg total) daily by mouth. 30 tablet 0  . sevelamer carbonate (RENVELA) 800 MG tablet Take 800 mg by mouth 3 (three) times daily with meals.    . simvastatin (ZOCOR) 40 MG tablet Take 40 mg by mouth daily at 6 PM.    . thiamine (VITAMIN B-1) 100 MG tablet Take 1 tablet (100 mg total) by mouth daily. 30 tablet 0  . triamcinolone cream (KENALOG) 0.1 % Apply 1 application topically every 12 (twelve) hours as  needed.     . warfarin (COUMADIN) 5 MG tablet Take 5 mg by mouth daily at 6 PM.    . carvedilol (COREG) 3.125 MG tablet Take 1 tablet (3.125 mg total) by mouth 2 (two) times daily. 60 tablet 0  . midodrine (PROAMATINE) 10 MG tablet Take 1 tablet (10 mg total) by mouth every Monday, Wednesday, and Friday with hemodialysis. (Patient not taking: Reported on 07/12/2017) 30 tablet 0  . polyethylene glycol (MIRALAX / GLYCOLAX) packet Take 17 g daily by mouth. (Patient not taking: Reported on 07/12/2017) 14 each 0  . sucralfate (CARAFATE) 1 GM/10ML suspension Take 10 mLs (1 g total) 4 (four) times daily -  with meals and at bedtime by  mouth. (Patient not taking: Reported on 07/12/2017) 420 mL 0   No current facility-administered medications on file prior to visit.       Physical Examination Vitals:   08/22/17 1448 08/22/17 1449  BP: (!) 146/56 (!) 140/51  Pulse: 72   Resp: 18   Temp: 98.8 F (37.1 C)   TempSrc: Oral   SpO2: 97%   Weight: 210 lb (95.3 kg)   Height: 5\' 2"  (1.575 m)    Body mass index is 38.41 kg/m.    Left medial thigh healing wound.    Left medial thigh wound bed appears healthy with red and pink tissue, signs of granulation and contracting.  Left AV thigh graft is anterior with palpable thrill.   Medical Decision Making  Awanda Wilcock is a 77 y.o. year old female who presents s/p  I&D of abscess left thigh and excisional debridement of wound left thigh (subcutaneous tissue) on 07-14-17 by Dr. Scot Dock for abscess of left thigh. On 06-02-17 she had an evacuation of left thigh hematoma, application of wound VAC, and placement of antibiotic coated beads by Dr. Trula Slade for left thigh hematoma.        Continue HH dressing changes. Medial thigh wound is healing and granulating very well with no signs of infection. I discussed with Dr. Donnetta Hutching. Pt to return in 2 weeks for a wound check, see Dr. Freeman Caldron Shirrell Solinger, RN, MSN, FNP-C [facility]  [place] Office: [phone]  08/22/2017, 3:14 PM  Clinic MD: [place]

## 2017-08-24 ENCOUNTER — Ambulatory Visit (INDEPENDENT_AMBULATORY_CARE_PROVIDER_SITE_OTHER): Payer: 59 | Admitting: Surgery

## 2017-08-28 DIAGNOSIS — D509 Iron deficiency anemia, unspecified: Secondary | ICD-10-CM | POA: Insufficient documentation

## 2017-08-31 ENCOUNTER — Encounter (INDEPENDENT_AMBULATORY_CARE_PROVIDER_SITE_OTHER): Payer: Self-pay | Admitting: Surgery

## 2017-08-31 ENCOUNTER — Telehealth (INDEPENDENT_AMBULATORY_CARE_PROVIDER_SITE_OTHER): Payer: Self-pay | Admitting: Surgery

## 2017-08-31 ENCOUNTER — Ambulatory Visit (INDEPENDENT_AMBULATORY_CARE_PROVIDER_SITE_OTHER): Payer: 59

## 2017-08-31 ENCOUNTER — Ambulatory Visit (INDEPENDENT_AMBULATORY_CARE_PROVIDER_SITE_OTHER): Payer: 59 | Admitting: Surgery

## 2017-08-31 VITALS — BP 139/88 | HR 77 | Ht 62.0 in | Wt 210.0 lb

## 2017-08-31 DIAGNOSIS — S62102A Fracture of unspecified carpal bone, left wrist, initial encounter for closed fracture: Secondary | ICD-10-CM

## 2017-08-31 DIAGNOSIS — S52502K Unspecified fracture of the lower end of left radius, subsequent encounter for closed fracture with nonunion: Secondary | ICD-10-CM

## 2017-08-31 DIAGNOSIS — M25532 Pain in left wrist: Secondary | ICD-10-CM

## 2017-08-31 NOTE — Progress Notes (Signed)
Office Visit Note   Patient: Ashlee Miles           Date of Birth: [birth year]           MRN: [id number] Visit Date: 08/31/2017              Requested by: Darlina Rumpf, MD [address] [place], [place] [address] PCP: Darlina Rumpf, MD   Assessment & Plan: Visit Diagnoses:  1. Pain in left wrist   2. 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  Follow-up with Dr. Marlou Sa in 2 weeks for recheck for cast off and repeat x-ray.  I did discuss with patient and family members present the possibility of using a bone stimulator.  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  With patient's medical history she is not a good operative candidate.  Follow-Up Instructions: Return in about 2 weeks (around 09/14/2017) for with Dr Marlou Sa.   Orders:  Orders Placed This Encounter  Procedures  . XR Wrist 2 Views Left   No orders of the defined types were placed in this encounter.     Procedures: No procedures performed   Clinical Data: No additional findings.   Subjective: Chief Complaint  Patient presents with  . Left Wrist - Follow-up, Fracture    HPI 77 year old white female with left distal radius fracture returns for recheck.  She has been compliant with wearing her wrist splint but still continues to have ongoing pain. Review of Systems No current cardiac pulmonary issues  Objective: Vital Signs: BP 139/88   Pulse 77   Ht 5\' 2"  (1.575 m)   Wt 210 lb (95.3 kg)   BMI 38.41 kg/m   Physical Exam  Constitutional: She is oriented to person, place, and time. No distress.  HENT:  Head: Normocephalic.  Eyes: EOM are normal.  Pulmonary/Chest: No respiratory distress.  Musculoskeletal:  Distal radius continues to be exquisitely tender.  Swelling decreased.  Neurovascular intact.  Neurological: She is alert and oriented to person, place, and  time.  Skin: Skin is warm and dry.  Psychiatric: She has a normal mood and affect.    Ortho Exam  Specialty Comments:  No specialty comments available.  Imaging: No results found.   PMFS History: Patient Active Problem List   Diagnosis Date Noted  . Leg wound, left 07/12/2017  . Cellulitis of left lower extremity 07/12/2017  . HLD (hyperlipidemia) 07/12/2017  . Chest pain in adult 06/22/2017  . Elevated troponin   . Altered mental status   . 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 Depression 05/31/2017  . GERD (gastroesophageal reflux disease) 05/31/2017  . Left groin pain 05/31/2017  . Protein C deficiency ([place])   . 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 Falls 04/25/2017  . Hypotension 04/25/2017  . Epigastric pain 04/25/2017  . Constipation 04/25/2017  . ESRD on dialysis ([place]) 04/06/2017  . Anemia due to chronic kidney disease 04/06/2017  . Thrombocytopenia ([place]) 04/06/2017  . Cough in adult 04/06/2017  . DVT (deep venous thrombosis) ([place]) 04/05/2017  . Pressure injury of skin 03/14/2017  . Acute metabolic encephalopathy 63/78/5885  . Skin lesion of hand 03/12/2017  . TIA (transient ischemic attack) 03/01/2017  . Syncope 03/01/2017  . Diabetes mellitus with complication ([place]) 77/77/4128  . Essential hypertension 03/01/2017  . Chronic diastolic CHF (congestive heart failure) (  [place]) 03/01/2017   Past Medical History:  Diagnosis Date  . CHF (congestive heart failure) ([place])   . Diabetes mellitus without complication ([place])   . ESRD (end stage renal disease) on dialysis ([place])   . History of pituitary adenoma   . Hypertension   . Protein C deficiency ([place])     Family History  Problem Relation Age of Onset  . Breast cancer Mother   . Diabetes Sister   . Diabetes Brother   . CAD Other   . Stroke Neg Hx     Past Surgical History:  Procedure Laterality Date  . AV FISTULA PLACEMENT Left 03/16/2017   Procedure: INSERTION OF ARTERIOVENOUS (AV)  GORE-TEX GRAFT THIGH-LEFT;  Surgeon: Angelia Mould, MD;  Location: [facility];  Service: Vascular;  Laterality: Left;  . CHOLECYSTECTOMY    . DIALYSIS FISTULA CREATION     clotted off  . FALSE ANEURYSM REPAIR Left 05/10/2017   Procedure: REPAIR FALSE ANEURYSM left thigh AVGG using Gore 51mmx5cm Viabahn Endoprosthesis;  Surgeon: Serafina Mitchell, MD;  Location: [facility];  Service: Vascular;  Laterality: Left;  . I&D EXTREMITY Left 07/14/2017   Procedure: IRRIGATION AND DEBRIDEMENT DISTAL THIGH ABSCESS;  Surgeon: Angelia Mould, MD;  Location: [facility];  Service: Vascular;  Laterality: Left;  . INSERTION OF DIALYSIS CATHETER Right 03/16/2017   Procedure: INSERTION OF RIGHT FEMORAL TUNNELED DIALYSIS CATHETER;  Surgeon: Angelia Mould, MD;  Location: [facility];  Service: Vascular;  Laterality: Right;  . IR THROMBECTOMY AV FISTULA W/THROMBOLYSIS/PTA INC/SHUNT/IMG LEFT Left 06/22/2017  . IR US GUIDE VASC ACCESS LEFT  06/22/2017  . IVC FILTER INSERTION    . PORTACATH PLACEMENT    . THROMBECTOMY AND REVISION OF ARTERIOVENTOUS (AV) GORETEX  GRAFT Left 06/02/2017   Procedure: EVACUATION OF LEFT THIGH HEMATOMA  AND APPLICATION OF WOUND VAC;  Surgeon: Serafina Mitchell, MD;  [facility];  Service: Vascular;  Laterality: Left;   Social History   Occupational History  . Occupation: retired  Tobacco Use  . Smoking status: Never Smoker  . Smokeless tobacco: Never Used  Substance and Sexual Activity  . Alcohol use: No  . Drug use: No  . Sexual activity: Not on file

## 2017-08-31 NOTE — Telephone Encounter (Signed)
Sharlet Salina with [facility] called to advise he had a visit with patient this morning, took her vitals and her BP is 164/88. He would like a call as soon as possible # [phone]

## 2017-08-31 NOTE — Telephone Encounter (Signed)
Returned Denny's call. Stated protocol was to notify if BP was elevated. Patient was alert and oriented with no signs of distress. This was just a FYI notification.

## 2017-08-31 NOTE — Telephone Encounter (Signed)
PLEASE ADVISE.

## 2017-09-04 ENCOUNTER — Ambulatory Visit: Payer: 59 | Admitting: Family

## 2017-09-04 ENCOUNTER — Ambulatory Visit: Payer: 59 | Admitting: Surgery

## 2017-09-14 ENCOUNTER — Ambulatory Visit (INDEPENDENT_AMBULATORY_CARE_PROVIDER_SITE_OTHER): Payer: 59

## 2017-09-14 ENCOUNTER — Encounter (INDEPENDENT_AMBULATORY_CARE_PROVIDER_SITE_OTHER): Payer: Self-pay | Admitting: Surgery

## 2017-09-14 ENCOUNTER — Ambulatory Visit (INDEPENDENT_AMBULATORY_CARE_PROVIDER_SITE_OTHER): Payer: 59 | Admitting: Surgery

## 2017-09-14 VITALS — BP 186/86 | HR 73

## 2017-09-14 DIAGNOSIS — M25512 Pain in left shoulder: Secondary | ICD-10-CM

## 2017-09-14 DIAGNOSIS — S62102A Fracture of unspecified carpal bone, left wrist, initial encounter for closed fracture: Secondary | ICD-10-CM

## 2017-09-14 DIAGNOSIS — G8929 Other chronic pain: Secondary | ICD-10-CM

## 2017-09-14 NOTE — Progress Notes (Signed)
Office Visit Note   Patient: Ashlee Miles           Date of Birth: July 26, 1940           MRN: [id number] Visit Date: 09/14/2017              Requested by: Darlina Rumpf, MD [address] [place], [place] [address] PCP: Darlina Rumpf, MD   Assessment & Plan: Visit Diagnoses:  1. Chronic left shoulder pain   2. Wrist fracture, closed, left, initial encounter     Plan: We will have patient follow in the office with Dr. Marlou Sa in 1 week for cast off and repeat x-ray of her wrist.  Question going back into a removable wrist splint at that time.  In regards to her left shoulder I asked patient to see if she can get her blood sugars down and we can try a subacromial Marcaine/Depo-Medrol injection.  I did review x-rays with patient today and family members were present.  There is question of a possible nondisplaced healing acromial fracture but this is not definite.  She is mildly tender over the acromion but most of her problems are from impingement symptoms and also tender over the lateral shoulder.  Follow-Up Instructions: Return in about 2 weeks (around 09/27/2017) for with Dr Marlou Sa for cast off and recheck shoulder.   Orders:  Orders Placed This Encounter  Procedures  . XR Shoulder Left   No orders of the defined types were placed in this encounter.     Procedures: No procedures performed   Clinical Data: No additional findings.   Subjective: Chief Complaint  Patient presents with  . Left Wrist - Pain, Follow-up    HPI Patient comes in for recheck of left distal radius fracture.  She is still in her cast.  She is doing much better in regards to her wrist with more immobilization. She is complaining of left shoulder pain that started at her fall in the nursing home.  Pain with overhead activity and reaching on her back.  Pain has been may be a little bit more bothersome since she was put in the cast.  No cervical spine radicular component. Review of Systems No current  cardiac pulmonary issues.  Objective: Vital Signs: BP (!) 186/86   Pulse 73   Physical Exam  Constitutional: She is oriented to person, place, and time. No distress.  HENT:  Head: Normocephalic.  Eyes: Pupils are equal, round, and reactive to light. EOM are normal.  Neck: Normal range of motion.  Pulmonary/Chest: No respiratory distress.  Musculoskeletal:  Left upper extremity cast intact.  Left shoulder good range of motion but with discomfort.  Positive impingement test.  Negative drop arm test.  She is mildly tender over the acromion.  Moderately tender over the lateral shoulder.  Neurological: She is alert and oriented to person, place, and time.  Skin: Skin is warm and dry.    Ortho Exam  Specialty Comments:  No specialty comments available.  Imaging: No results found.   PMFS History: Patient Active Problem List   Diagnosis Date Noted  . Leg wound, left 07/12/2017  . Cellulitis of left lower extremity 07/12/2017  . HLD (hyperlipidemia) 07/12/2017  . Chest pain in adult 06/22/2017  . Elevated troponin   . Altered mental status   . 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 Depression 05/31/2017  . GERD (gastroesophageal reflux disease) 05/31/2017  . Left groin pain 05/31/2017  . Protein C  deficiency ([place])   . 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 Falls 04/25/2017  . Hypotension 04/25/2017  . Epigastric pain 04/25/2017  . Constipation 04/25/2017  . ESRD on dialysis ([place]) 04/06/2017  . Anemia due to chronic kidney disease 04/06/2017  . Thrombocytopenia ([place]) 04/06/2017  . Cough in adult 04/06/2017  . DVT (deep venous thrombosis) ([place]) 04/05/2017  . Pressure injury of skin 03/14/2017  . Acute metabolic encephalopathy 55/97/4163  . Skin lesion of hand 03/12/2017  . TIA (transient ischemic attack) 03/01/2017  . Syncope 03/01/2017  . Diabetes mellitus with complication ([place]) 84/53/6468  . Essential hypertension 03/01/2017  . Chronic diastolic  CHF (congestive heart failure) ([place]) 03/01/2017   Past Medical History:  Diagnosis Date  . CHF (congestive heart failure) ([place])   . Diabetes mellitus without complication ([place])   . ESRD (end stage renal disease) on dialysis ([place])   . History of pituitary adenoma   . Hypertension   . Protein C deficiency ([place])     Family History  Problem Relation Age of Onset  . Breast cancer Mother   . Diabetes Sister   . Diabetes Brother   . CAD Other   . Stroke Neg Hx     Past Surgical History:  Procedure Laterality Date  . AV FISTULA PLACEMENT Left 03/16/2017   Procedure: INSERTION OF ARTERIOVENOUS (AV) GORE-TEX GRAFT THIGH-LEFT;  Surgeon: Angelia Mould, MD;  Location: [facility];  Service: Vascular;  Laterality: Left;  . CHOLECYSTECTOMY    . DIALYSIS FISTULA CREATION     clotted off  . FALSE ANEURYSM REPAIR Left 05/10/2017   Procedure: REPAIR FALSE ANEURYSM left thigh AVGG using Gore 63mmx5cm Viabahn Endoprosthesis;  Surgeon: Serafina Mitchell, MD;  Location: [facility];  Service: Vascular;  Laterality: Left;  . I&D EXTREMITY Left 07/14/2017   Procedure: IRRIGATION AND DEBRIDEMENT DISTAL THIGH ABSCESS;  Surgeon: Angelia Mould, MD;  Location: [facility];  Service: Vascular;  Laterality: Left;  . INSERTION OF DIALYSIS CATHETER Right 03/16/2017   Procedure: INSERTION OF RIGHT FEMORAL TUNNELED DIALYSIS CATHETER;  Surgeon: Angelia Mould, MD;  Location[facility] OR;  Service: Vascular;  Laterality: Right;  . IR THROMBECTOMY AV FISTULA W/THROMBOLYSIS/PTA INC/SHUNT/IMG LEFT Left 06/22/2017  . IR US GUIDE VASC ACCESS LEFT  06/22/2017  . IVC FILTER INSERTION    . PORTACATH PLACEMENT    . THROMBECTOMY AND REVISION OF ARTERIOVENTOUS (AV) GORETEX  GRAFT Left 06/02/2017   Procedure: EVACUATION OF LEFT THIGH HEMATOMA  AND APPLICATION OF WOUND VAC;  Surgeon: Serafina Mitchell, MD;  [facility];  Service: Vascular;  Laterality: Left;   Social History   Occupational History  . Occupation: retired    Tobacco Use  . Smoking status: Never Smoker  . Smokeless tobacco: Never Used  Substance and Sexual Activity  . Alcohol use: No  . Drug use: No  . Sexual activity: Not on file

## 2017-09-21 ENCOUNTER — Ambulatory Visit (INDEPENDENT_AMBULATORY_CARE_PROVIDER_SITE_OTHER): Payer: 59 | Admitting: Surgery

## 2017-09-27 ENCOUNTER — Ambulatory Visit (INDEPENDENT_AMBULATORY_CARE_PROVIDER_SITE_OTHER): Payer: 59 | Admitting: Orthopedic Surgery

## 2017-10-05 ENCOUNTER — Ambulatory Visit (INDEPENDENT_AMBULATORY_CARE_PROVIDER_SITE_OTHER): Payer: 59 | Admitting: Orthopedic Surgery

## 2017-10-10 ENCOUNTER — Ambulatory Visit (INDEPENDENT_AMBULATORY_CARE_PROVIDER_SITE_OTHER): Payer: Self-pay

## 2017-10-10 ENCOUNTER — Other Ambulatory Visit (INDEPENDENT_AMBULATORY_CARE_PROVIDER_SITE_OTHER): Payer: Self-pay | Admitting: Orthopedic Surgery

## 2017-10-10 ENCOUNTER — Encounter (INDEPENDENT_AMBULATORY_CARE_PROVIDER_SITE_OTHER): Payer: Self-pay | Admitting: Orthopedic Surgery

## 2017-10-10 ENCOUNTER — Ambulatory Visit (INDEPENDENT_AMBULATORY_CARE_PROVIDER_SITE_OTHER): Payer: Medicaid Other | Admitting: Orthopedic Surgery

## 2017-10-10 DIAGNOSIS — M25532 Pain in left wrist: Secondary | ICD-10-CM

## 2017-10-10 DIAGNOSIS — S62102D Fracture of unspecified carpal bone, left wrist, subsequent encounter for fracture with routine healing: Secondary | ICD-10-CM

## 2017-10-10 NOTE — Progress Notes (Signed)
Office Visit Note   Patient: Ashlee Miles           Date of Birth: [birth year]           MRN: [id number] Visit Date: 10/10/2017 Requested by: Darlina Rumpf, MD [address] [place], [place] [address] PCP: Darlina Rumpf, MD  Subjective: Chief Complaint  Patient presents with  . Left Shoulder - Follow-up    HPI: Ashlee Miles is a 77 year old patient with left hand pain and left shoulder pain.  She had a fall late last year.  All radiographs are reviewed.  Distal radius fracture present.  Patient receives dialysis in that left arm.  She has been in a cast for the last 3 weeks.  Cast is removed today.              ROS: All systems reviewed are negative as they relate to the chief complaint within the history of present illness.  Patient denies  fevers or chills.   Assessment & Plan: Visit Diagnoses:  1. Closed fracture of left wrist with routine healing, subsequent encounter     Plan: Impression left distal radius fracture and left shoulder pain.  Shoulder exam is pretty good today without coarse grinding and crepitus.  Cuff strength is pretty reasonable.  Left wrist is still tender and painful.  I do not see a lot of robust callus formation on today's x-rays compared to those from January.  She needs CT scan to evaluate healing of this distal radius fracture.  We will put her into a removable splint with stockinette just for comfort.  The shoulder we can watch for now.  Follow-Up Instructions: Return in about 2 weeks (around 10/24/2017).   Orders:  No orders of the defined types were placed in this encounter.  No orders of the defined types were placed in this encounter.     Procedures: No procedures performed   Clinical Data: No additional findings.  Objective: Vital Signs: There were no vitals taken for this visit.  Physical Exam:   Constitutional: Patient appears well-developed HEENT:  Head: Normocephalic Eyes:EOM are normal Neck: Normal range of  motion Cardiovascular: Normal rate Pulmonary/chest: Effort normal Neurologic: Patient is alert Skin: Skin is warm Psychiatric: Patient has normal mood and affect    Ortho Exam: Orthopedic exam demonstrates tenderness to palpation around the distal radius.  Radial pulses intact.  Fingers are stiff.  Shoulder has pretty good passive range of motion without coarse grinding or crepitus and good cuff strength.  Elbow range of motion intact on the left-hand side  Specialty Comments:  No specialty comments available.  Imaging: Xr Wrist 2 Views Left  Result Date: 10/10/2017 AP lateral left wrist reviewed.  Distal radius fracture noted.  There is been some shortening and loss of radial inclination.  Minimal callus formation present.    PMFS History: Patient Active Problem List   Diagnosis Date Noted  . Leg wound, left 07/12/2017  . Cellulitis of left lower extremity 07/12/2017  . HLD (hyperlipidemia) 07/12/2017  . Chest pain in adult 06/22/2017  . Elevated troponin   . Altered mental status   . 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 Depression 05/31/2017  . GERD (gastroesophageal reflux disease) 05/31/2017  . Left groin pain 05/31/2017  . Protein C deficiency ([place])   . 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 Falls 04/25/2017  . Hypotension 04/25/2017  . Epigastric pain 04/25/2017  . Constipation 04/25/2017  . ESRD on dialysis ([place]) 04/06/2017  .  Anemia due to chronic kidney disease 04/06/2017  . Thrombocytopenia ([place]) 04/06/2017  . Cough in adult 04/06/2017  . DVT (deep venous thrombosis) ([place]) 04/05/2017  . Pressure injury of skin 03/14/2017  . Acute metabolic encephalopathy 34/74/2595  . Skin lesion of hand 03/12/2017  . TIA (transient ischemic attack) 03/01/2017  . Syncope 03/01/2017  . Diabetes mellitus with complication ([place]) 63/87/5643  . Essential hypertension 03/01/2017  . Chronic diastolic CHF (congestive heart failure) ([place]) 03/01/2017   Past  Medical History:  Diagnosis Date  . CHF (congestive heart failure) ([place])   . Diabetes mellitus without complication ([place])   . ESRD (end stage renal disease) on dialysis ([place])   . History of pituitary adenoma   . Hypertension   . Protein C deficiency ([place])     Family History  Problem Relation Age of Onset  . Breast cancer Mother   . Diabetes Sister   . Diabetes Brother   . CAD Other   . Stroke Neg Hx     Past Surgical History:  Procedure Laterality Date  . AV FISTULA PLACEMENT Left 03/16/2017   Procedure: INSERTION OF ARTERIOVENOUS (AV) GORE-TEX GRAFT THIGH-LEFT;  Surgeon: Angelia Mould, MD;  Location: [facility];  Service: Vascular;  Laterality: Left;  . CHOLECYSTECTOMY    . DIALYSIS FISTULA CREATION     clotted off  . FALSE ANEURYSM REPAIR Left 05/10/2017   Procedure: REPAIR FALSE ANEURYSM left thigh AVGG using Gore 26mmx5cm Viabahn Endoprosthesis;  Surgeon: Serafina Mitchell, MD;  Location: [facility];  Service: Vascular;  Laterality: Left;  . I&D EXTREMITY Left 07/14/2017   Procedure: IRRIGATION AND DEBRIDEMENT DISTAL THIGH ABSCESS;  Surgeon: Angelia Mould, MD;  Location: [facility];  Service: Vascular;  Laterality: Left;  . INSERTION OF DIALYSIS CATHETER Right 03/16/2017   Procedure: INSERTION OF RIGHT FEMORAL TUNNELED DIALYSIS CATHETER;  Surgeon: Angelia Mould, MD;  Location: [facility] OR;  Service: Vascular;  Laterality: Right;  . IR THROMBECTOMY AV FISTULA W/THROMBOLYSIS/PTA INC/SHUNT/IMG LEFT Left 06/22/2017  . IR US GUIDE VASC ACCESS LEFT  06/22/2017  . IVC FILTER INSERTION    . PORTACATH PLACEMENT    . THROMBECTOMY AND REVISION OF ARTERIOVENTOUS (AV) GORETEX  GRAFT Left 06/02/2017   Procedure: EVACUATION OF LEFT THIGH HEMATOMA  AND APPLICATION OF WOUND VAC;  Surgeon: Serafina Mitchell, MD;  [facility];  Service: Vascular;  Laterality: Left;   Social History   Occupational History  . Occupation: retired  Tobacco Use  . Smoking status: Never Smoker  . Smokeless  tobacco: Never Used  Substance and Sexual Activity  . Alcohol use: No  . Drug use: No  . Sexual activity: Not on file

## 2017-10-10 NOTE — Addendum Note (Signed)
Addended by: Jacklyn Shell on: 10/10/2017 02:41 PM   Modules accepted: Orders

## 2017-10-17 ENCOUNTER — Telehealth (INDEPENDENT_AMBULATORY_CARE_PROVIDER_SITE_OTHER): Payer: Self-pay | Admitting: Orthopedic Surgery

## 2017-10-17 NOTE — Telephone Encounter (Signed)
Try ace wrap also may help besides that no

## 2017-10-17 NOTE — Telephone Encounter (Signed)
Tried calling back to discuss. No answer. LMVM with details advising per Dr Marlou Sa.

## 2017-10-17 NOTE — Telephone Encounter (Signed)
Any suggestions other than ice, elevate?

## 2017-10-17 NOTE — Telephone Encounter (Signed)
Patients daughter Ashlee Miles would like a call back concerning her moms swelling and condition. She said shes not sure what to do for her besides elevating. Please advise  [phone]

## 2017-10-23 ENCOUNTER — Ambulatory Visit: Payer: 59 | Admitting: Surgery

## 2017-10-23 ENCOUNTER — Encounter: Payer: Self-pay | Admitting: Surgery

## 2017-10-23 ENCOUNTER — Other Ambulatory Visit: Payer: Self-pay

## 2017-10-23 ENCOUNTER — Ambulatory Visit (INDEPENDENT_AMBULATORY_CARE_PROVIDER_SITE_OTHER): Payer: 59 | Admitting: Surgery

## 2017-10-23 VITALS — BP 180/79 | HR 75 | Temp 97.3°F | Resp 16 | Ht 62.0 in | Wt 207.0 lb

## 2017-10-23 DIAGNOSIS — Z992 Dependence on renal dialysis: Secondary | ICD-10-CM | POA: Diagnosis not present

## 2017-10-23 DIAGNOSIS — N186 End stage renal disease: Secondary | ICD-10-CM | POA: Diagnosis not present

## 2017-10-23 NOTE — Progress Notes (Signed)
Vascular and Vein Specialist of [facility]  Patient name: Ashlee Miles MRN: [id number] DOB: [birth year] Sex: female   REASON FOR VISIT:    Follow-up  HISOTRY OF PRESENT ILLNESS:    Ashlee Miles is a 77 y.o. 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  She developed a pseudoaneurysm in her left thigh graft which was treated on 05/10/2017 with a covered stent.  She came back to the emergency department approximately a month later with worsening pain.  She went to the operating room on 1214 for evacuation of hematoma and placement of the wound VAC.  This required an additional I&D of the wound on 07/14/2017.  She is on dialysis Monday Wednesday Friday.  She has no complaints today.   PAST MEDICAL HISTORY:   Past Medical History:  Diagnosis Date  . CHF (congestive heart failure) ([place])   . Diabetes mellitus without complication ([place])   . ESRD (end stage renal disease) on dialysis ([place])   . History of pituitary adenoma   . Hypertension   . Protein C deficiency ([place])      FAMILY HISTORY:   Family History  Problem Relation Age of Onset  . Breast cancer Mother   . Diabetes Sister   . Diabetes Brother   . CAD Other   . Stroke Neg Hx     SOCIAL HISTORY:   Social History   Tobacco Use  . Smoking status: Never Smoker  . Smokeless tobacco: Never Used  Substance Use Topics  . Alcohol use: No     ALLERGIES:   No Active Allergies   CURRENT MEDICATIONS:   Current Outpatient Medications  Medication Sig Dispense Refill  . Amino Acids-Protein Hydrolys (FEEDING SUPPLEMENT, PRO-STAT SUGAR FREE 64,) LIQD Take 30 mLs by mouth 2 (two) times daily. 900 mL 0  . calcitRIOL (ROCALTROL) 0.5 MCG capsule Take 0.5 mcg by mouth daily.    . collagenase (SANTYL) ointment Apply topically 2 (two) times daily. Apply to L thigh wound BID. Wet-to-dry dressing on top Santyl BID 15 g 0  . docusate sodium (COLACE) 100 MG capsule Take 1 capsule  (100 mg total) by mouth 2 (two) times daily. 10 capsule 0  . famotidine (PEPCID) 20 MG tablet Take 1 tablet (20 mg total) daily by mouth. 30 tablet 0  . gabapentin (NEURONTIN) 100 MG capsule Take 1 capsule (100 mg total) by mouth at bedtime. 30 capsule 0  . insulin aspart protamine- aspart (NOVOLOG MIX 70/30) (70-30) 100 UNIT/ML injection Inject 30-40 Units into the skin See admin instructions. 40 units in the morning and 30 units in the evening or Sliding Scale    . ipratropium-albuterol (DUONEB) 0.5-2.5 (3) MG/3ML SOLN Take 3 mLs by nebulization every 4 (four) hours as needed. 360 mL 0  . linaclotide (LINZESS) 145 MCG CAPS capsule Take 145 mcg by mouth daily as needed.     . midodrine (PROAMATINE) 10 MG tablet Take 1 tablet (10 mg total) by mouth every Monday, Wednesday, and Friday with hemodialysis. 30 tablet 0  . multivitamin (RENA-VIT) TABS tablet Take 1 tablet by mouth at bedtime. 30 tablet 0  . pantoprazole (PROTONIX) 40 MG tablet Take 1 tablet (40 mg total) 2 (two) times daily before a meal by mouth. 60 tablet 0  . PARoxetine (PAXIL) 10 MG tablet Take 1 tablet (10 mg total) daily by mouth. 30 tablet 0  . polyethylene glycol (MIRALAX / GLYCOLAX) packet Take 17 g daily by mouth. 14 each 0  .  sevelamer carbonate (RENVELA) 800 MG tablet Take 800 mg by mouth 3 (three) times daily with meals.    . simvastatin (ZOCOR) 40 MG tablet Take 40 mg by mouth daily at 6 PM.    . sucralfate (CARAFATE) 1 GM/10ML suspension Take 10 mLs (1 g total) 4 (four) times daily -  with meals and at bedtime by mouth. 420 mL 0  . thiamine (VITAMIN B-1) 100 MG tablet Take 1 tablet (100 mg total) by mouth daily. 30 tablet 0  . triamcinolone cream (KENALOG) 0.1 % Apply 1 application topically every 12 (twelve) hours as needed.     . warfarin (COUMADIN) 5 MG tablet Take 5 mg by mouth daily at 6 PM.    . carvedilol (COREG) 3.125 MG tablet Take 1 tablet (3.125 mg total) by mouth 2 (two) times daily. 60 tablet 0   No current  facility-administered medications for this visit.     REVIEW OF SYSTEMS:   [X]  denotes positive finding, [ ]  denotes negative finding Cardiac  Comments:  Chest pain or chest pressure:    Shortness of breath upon exertion:    Short of breath when lying flat:    Irregular heart rhythm:        Vascular    Pain in calf, thigh, or hip brought on by ambulation:    Pain in feet at night that wakes you up from your sleep:     Blood clot in your veins:    Leg swelling:         Pulmonary    Oxygen at home:    Productive cough:     Wheezing:         Neurologic    Sudden weakness in arms or legs:     Sudden numbness in arms or legs:     Sudden onset of difficulty speaking or slurred speech:    Temporary loss of vision in one eye:     Problems with dizziness:         Gastrointestinal    Blood in stool:     Vomited blood:         Genitourinary    Burning when urinating:     Blood in urine:        Psychiatric    Major depression:         Hematologic    Bleeding problems:    Problems with blood clotting too easily:        Skin    Rashes or ulcers:        Constitutional    Fever or chills:      PHYSICAL EXAM:   Vitals:   10/23/17 0923 10/23/17 0926  BP: (!) 171/82 (!) 180/79  Pulse: 73 75  Resp: 16   Temp: (!) 97.3 F (36.3 C)   TempSrc: Oral   SpO2: 96%   Weight: 207 lb (93.9 kg)   Height: 5\' 2"  (1.575 m)     GENERAL: The patient is a well-nourished female, in no acute distress. The vital signs are documented above. CARDIAC: There is a regular rate and rhythm.  VASCULAR: Palpable thrill within left thigh dialysis graft.  Wound has completely healed PULMONARY: Non-labored respirations MUSCULOSKELETAL: There are no major deformities or cyanosis. NEUROLOGIC: No focal weakness or paresthesias are detected. SKIN: Left thigh wound has completely healed PSYCHIATRIC: The patient has a normal affect.  STUDIES:   None  MEDICAL ISSUES:   Status post evacuation  of hematoma surrounding her left thigh  graft.  She has undergone debridement in the operating room and has now healed her wound with the assistance of the wound VAC.  No further follow-up is necessary.    Annamarie Major, MD Vascular and Vein Specialists of [facility] [phone] Pager [phone]

## 2017-10-24 ENCOUNTER — Ambulatory Visit
Admission: RE | Admit: 2017-10-24 | Discharge: 2017-10-24 | Disposition: A | Discharge status: Home / Self Care | Payer: 59 | Source: Ambulatory Visit | Attending: Orthopedic Surgery | Admitting: Orthopedic Surgery

## 2017-10-24 DIAGNOSIS — M25532 Pain in left wrist: Secondary | ICD-10-CM

## 2017-10-25 ENCOUNTER — Ambulatory Visit (INDEPENDENT_AMBULATORY_CARE_PROVIDER_SITE_OTHER): Payer: 59 | Admitting: Orthopedic Surgery

## 2017-10-25 ENCOUNTER — Telehealth: Payer: Self-pay

## 2017-10-26 ENCOUNTER — Encounter (INDEPENDENT_AMBULATORY_CARE_PROVIDER_SITE_OTHER): Payer: Self-pay | Admitting: Orthopedic Surgery

## 2017-10-26 ENCOUNTER — Telehealth (INDEPENDENT_AMBULATORY_CARE_PROVIDER_SITE_OTHER): Payer: Self-pay

## 2017-10-26 ENCOUNTER — Ambulatory Visit (INDEPENDENT_AMBULATORY_CARE_PROVIDER_SITE_OTHER): Payer: 59 | Admitting: Orthopedic Surgery

## 2017-10-26 DIAGNOSIS — S62102D Fracture of unspecified carpal bone, left wrist, subsequent encounter for fracture with routine healing: Secondary | ICD-10-CM

## 2017-10-26 NOTE — Telephone Encounter (Signed)
Patient was seen in office today for follow up of wrist fracture. Dr Marlou Sa ordered outpatient PT for Gentle ROM and strengthening and function ROM. 1x/wk for 8wks. I asked patient which outpatient facility she wanted to go to and her and her son stated that someone needed to try to get her HHPT b/c she goes to dialysis 3 times a week and is just too tired to go to outpt setting. Tried to explain to them that insurance not likely to cover HHPT b/c patient is not considered "home bound". They were very insistent about trying to get HHPT approval so I called and talked with Sonia Side at [place] and he stated they would try to get this approved but not for certain. He will update either myself or patient as he knows All information faxed to [facility]

## 2017-10-27 ENCOUNTER — Telehealth (INDEPENDENT_AMBULATORY_CARE_PROVIDER_SITE_OTHER): Payer: Self-pay | Admitting: Orthopedic Surgery

## 2017-10-27 NOTE — Telephone Encounter (Signed)
IC, had very at length conversation with patients daughter and she wanted to make sure that you were aware of her concerns for her mom. She states that her moms wrist is in bad shape, she cannot move it, its swollen and very stiff. She states patient has no function in the wrist. I tried to advise that we were aware hence the reason for [place] referring her to PT and getting the bone stimulator. Also told her that we were trying to get her arranged for HHPT but not likely insurance would cover b/c she is not considered "home bound". Advised that if not approved would need to go to out patient facility. Daughter stated that would not work and that she needs to go to a nursing facility. I advised her that we did not have the capability to admit patients for such condition as need for PT for wrist fracture to nursing facility. She stated it was possible because had been done in past by patients PCP. Advised she could contact PCP to see if they could assist if this was something that they were wanting for patient but we could not do this. She verbalized understanding. However, she said she would like to talk with you about her moms care when you get a chance.

## 2017-10-27 NOTE — Telephone Encounter (Signed)
Spoke with daughter, [name], who provided update on patient. Visit scheduled with Palliative Care NP.

## 2017-10-27 NOTE — Telephone Encounter (Signed)
Patient's daughter called and wanted to speak with you about her mother in regards to her rehab.  CB#[phone].  Thank you.

## 2017-10-29 ENCOUNTER — Encounter (INDEPENDENT_AMBULATORY_CARE_PROVIDER_SITE_OTHER): Payer: Self-pay | Admitting: Orthopedic Surgery

## 2017-10-29 NOTE — Progress Notes (Signed)
Office Visit Note   Patient: Ashlee Miles           Date of Birth: Oct 20, 1940           MRN: [id number] Visit Date: 10/26/2017 Requested by: Darlina Rumpf, MD [address] [place], [place] [address] PCP: Darlina Rumpf, MD  Subjective: Chief Complaint  Patient presents with  . Left Wrist - Follow-up, Fracture    HPI: Ashlee Miles is a patient with left wrist fracture.  Date of injury October 2018.  Since I have seen her she is had a CT scan.  I reviewed the CT scan with Ashlee Miles and it looks like there is incomplete healing possible nonunion.  She still is more symptomatic than I would expect from a healed fracture.  She has not yet had physical therapy.  She has been using a splint.              ROS: All systems reviewed are negative as they relate to the chief complaint within the history of present illness.  Patient denies  fevers or chills.   Assessment & Plan: Visit Diagnoses:  1. Closed fracture of left wrist with routine healing, subsequent encounter     Plan: Impression is incomplete healing versus nonunion left distal radius fracture in a patient who has multiple medical problems and is not a great operative candidate.  Plan is bone stimulator and physical therapy 1 times a week for 8 weeks.  Want to see if we can at least get the wrist little bit more functional with range of motion.  Family was asking about home health PT but with this wrist fracture I do not think that she would necessarily qualify for that.  I will see her back in 8 weeks for clinical recheck and to see how she is doing with the bone stimulator.  Follow-Up Instructions: Return in about 8 weeks (around 12/21/2017).   Orders:  Orders Placed This Encounter  Procedures  . Ambulatory referral to Physical Therapy   No orders of the defined types were placed in this encounter.     Procedures: No procedures performed   Clinical Data: No additional findings.  Objective: Vital Signs: There were no vitals  taken for this visit.  Physical Exam:   Constitutional: Patient appears well-developed HEENT:  Head: Normocephalic Eyes:EOM are normal Neck: Normal range of motion Cardiovascular: Normal rate Pulmonary/chest: Effort normal Neurologic: Patient is alert Skin: Skin is warm Psychiatric: Patient has normal mood and affect    Ortho Exam: Orthopedic exam demonstrates some swelling in the left wrist compared to the right.  She has some pain with pronation supination of that left wrist which is consistent with slightly widened distal radial ulnar joint interval.  She does have diminished range of motion to about half normal flexion and extension.  Grip strength also diminished.  No skin color difference or temperature difference left hand versus right.  Specialty Comments:  No specialty comments available.  Imaging: No results found.   PMFS History: Patient Active Problem List   Diagnosis Date Noted  . Leg wound, left 07/12/2017  . Cellulitis of left lower extremity 07/12/2017  . HLD (hyperlipidemia) 07/12/2017  . Chest pain in adult 06/22/2017  . Elevated troponin   . Altered mental status   . Diabetes mellitus with end stage renal disease ([place]) 05/31/2017  . Depression 05/31/2017  . GERD (gastroesophageal reflux disease) 05/31/2017  . Left groin pain 05/31/2017  . Protein C deficiency ([place])   . Confusional  state 04/25/2017  . Falls 04/25/2017  . Hypotension 04/25/2017  . Epigastric pain 04/25/2017  . Constipation 04/25/2017  . ESRD on dialysis ([place]) 04/06/2017  . Anemia due to chronic kidney disease 04/06/2017  . Thrombocytopenia ([place]) 04/06/2017  . Cough in adult 04/06/2017  . DVT (deep venous thrombosis) ([place]) 04/05/2017  . Pressure injury of skin 03/14/2017  . Acute metabolic encephalopathy 48/06/6551  . Skin lesion of hand 03/12/2017  . TIA (transient ischemic attack) 03/01/2017  . Syncope 03/01/2017  . Diabetes mellitus with complication ([place]) 74/82/7078  .  Essential hypertension 03/01/2017  . Chronic diastolic CHF (congestive heart failure) ([place]) 03/01/2017   Past Medical History:  Diagnosis Date  . CHF (congestive heart failure) ([place])   . Diabetes mellitus without complication ([place])   . ESRD (end stage renal disease) on dialysis ([place])   . History of pituitary adenoma   . Hypertension   . Protein C deficiency ([place])     Family History  Problem Relation Age of Onset  . Breast cancer Mother   . Diabetes Sister   . Diabetes Brother   . CAD Other   . Stroke Neg Hx     Past Surgical History:  Procedure Laterality Date  . AV FISTULA PLACEMENT Left 03/16/2017   Procedure: INSERTION OF ARTERIOVENOUS (AV) GORE-TEX GRAFT THIGH-LEFT;  Surgeon: Angelia Mould, MD;  Location: [facility];  Service: Vascular;  Laterality: Left;  . CHOLECYSTECTOMY    . DIALYSIS FISTULA CREATION     clotted off  . FALSE ANEURYSM REPAIR Left 05/10/2017   Procedure: REPAIR FALSE ANEURYSM left thigh AVGG using Gore 20mmx5cm Viabahn Endoprosthesis;  Surgeon: Serafina Mitchell, MD;  Location: [facility];  Service: Vascular;  Laterality: Left;  . I&D EXTREMITY Left 07/14/2017   Procedure: IRRIGATION AND DEBRIDEMENT DISTAL THIGH ABSCESS;  Surgeon: Angelia Mould, MD;  Location: [facility];  Service: Vascular;  Laterality: Left;  . INSERTION OF DIALYSIS CATHETER Right 03/16/2017   Procedure: INSERTION OF RIGHT FEMORAL TUNNELED DIALYSIS CATHETER;  Surgeon: Angelia Mould, MD;  Location: [facility] OR;  Service: Vascular;  Laterality: Right;  . IR THROMBECTOMY AV FISTULA W/THROMBOLYSIS/PTA INC/SHUNT/IMG LEFT Left 06/22/2017  . IR US GUIDE VASC ACCESS LEFT  06/22/2017  . IVC FILTER INSERTION    . PORTACATH PLACEMENT    . THROMBECTOMY AND REVISION OF ARTERIOVENTOUS (AV) GORETEX  GRAFT Left 06/02/2017   Procedure: EVACUATION OF LEFT THIGH HEMATOMA  AND APPLICATION OF WOUND VAC;  Surgeon: Serafina Mitchell, MD;  Location: [facility] OR;  Service: Vascular;  Laterality: Left;   Social History     Occupational History  . Occupation: retired  Tobacco Use  . Smoking status: Never Smoker  . Smokeless tobacco: Never Used  Substance and Sexual Activity  . Alcohol use: No  . Drug use: No  . Sexual activity: Not on file

## 2017-10-30 ENCOUNTER — Telehealth (INDEPENDENT_AMBULATORY_CARE_PROVIDER_SITE_OTHER): Payer: Self-pay | Admitting: Orthopedic Surgery

## 2017-10-30 ENCOUNTER — Ambulatory Visit: Payer: 59 | Admitting: Surgery

## 2017-10-30 NOTE — Telephone Encounter (Signed)
Noted thanks °

## 2017-10-30 NOTE — Telephone Encounter (Signed)
Erik from [facility] called wanting to let Dr. Marlou Sa know they are going to go ahead and complete the 1 week 8 for PT. CB # [phone]

## 2017-10-31 ENCOUNTER — Other Ambulatory Visit: Payer: 59 | Admitting: Internal Medicine

## 2017-10-31 DIAGNOSIS — R531 Weakness: Secondary | ICD-10-CM | POA: Insufficient documentation

## 2017-10-31 DIAGNOSIS — R413 Other amnesia: Secondary | ICD-10-CM

## 2017-10-31 DIAGNOSIS — Z515 Encounter for palliative care: Secondary | ICD-10-CM

## 2017-10-31 NOTE — Progress Notes (Signed)
PALLIATIVE CARE CONSULT VISIT   PATIENT NAME: Ashlee Miles DOB: [birth year] MRN: [id number]  PRIMARY CARE PROVIDER:   Darlina Rumpf, MD  REFERRING PROVIDER:      Darlina Rumpf, MD [address] [place], [place] [address]  RESPONSIBLE PARTY:   [name] Lathon([phone]   RECOMMENDATIONS and PLAN:   1. Generalized weakness  R53.1:  Gradually improving per pt report since beginning dialysis. Hopeful for continued increase of strength.  Continue exercises as recommended by PT.  Heart healthy meals.    2. 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 Family reports cognitive decline.  Continue supportive care as needed(meds, meals).  3.  Palliative care encounter  Z51.5:   Discussed advanced care planning and goals of care.  Code status previously changed and is DNR.  Golden rod form in home.   MOST form completed:  No intubation or mechanical ventilation, intermediate interventions,  IV fluids and antibiotics if indicated, no tube feeding. Pt's goal is to continue dialysis and resume attending church services.  Palliative care will continue to follow.  I spent 60 minutes providing this consultation,  from 9:15am to 10:15am in the home. More than 50% of the time in this consultation was spent coordinating communication with the patient.Marland Kitchen   HISTORY OF PRESENT ILLNESS:  Ashlee Miles is a 77 y.o. year old female with multiple medical problems including ESRD on hemodialysis 3x/week that began in Sept.2018. Type 2 diabetes on insulin and CHF.  She was hospitalized multiple times in 2018 after relocation to [place] and residing with her daughter.  She fell in Dec. 2018 and sustained a fracture of her L wrist.  No surgical repair.  She requires assistance with ADLs, medication administration and meal preparation.  She is ambulatory with use of a walker and is continent.  She has a personal aid for home assistance 5-6 x/week.   Palliative Care was asked to help  address goals of care.   CODE STATUS: DNAR/DNI  PPS: 50% HOSPICE ELIGIBILITY/DIAGNOSIS: TBD  PAST MEDICAL HISTORY:  Past Medical History:  Diagnosis Date  . CHF (congestive heart failure) ([place])   . Diabetes mellitus without complication ([place])   . ESRD (end stage renal disease) on dialysis ([place])   . History of pituitary adenoma   . Hypertension   . Protein C deficiency ([place])     SOCIAL HX:  Social History   Tobacco Use  . Smoking status: Never Smoker  . Smokeless tobacco: Never Used  Substance Use Topics  . Alcohol use: No    ALLERGIES: No Active Allergies   PERTINENT MEDICATIONS:  Outpatient Encounter Medications as of 10/31/2017  Medication Sig  . Amino Acids-Protein Hydrolys (FEEDING SUPPLEMENT, PRO-STAT SUGAR FREE 64,) LIQD Take 30 mLs by mouth 2 (two) times daily.  . calcitRIOL (ROCALTROL) 0.5 MCG capsule Take 0.5 mcg by mouth daily.  . carvedilol (COREG) 3.125 MG tablet Take 1 tablet (3.125 mg total) by mouth 2 (two) times daily.  . collagenase (SANTYL) ointment Apply topically 2 (two) times daily. Apply to L thigh wound BID. Wet-to-dry dressing on top Santyl BID  . docusate sodium (COLACE) 100 MG capsule Take 1 capsule (100 mg total) by mouth 2 (two) times daily.  . famotidine (PEPCID) 20 MG tablet Take 1 tablet (20 mg total) daily by mouth.  . gabapentin (NEURONTIN) 100 MG capsule Take 1 capsule (100 mg total) by mouth at bedtime.  . insulin aspart protamine- aspart (NOVOLOG MIX 70/30) (70-30) 100 UNIT/ML injection Inject  30-40 Units into the skin See admin instructions. 40 units in the morning and 30 units in the evening or Sliding Scale  . ipratropium-albuterol (DUONEB) 0.5-2.5 (3) MG/3ML SOLN Take 3 mLs by nebulization every 4 (four) hours as needed.  . linaclotide (LINZESS) 145 MCG CAPS capsule Take 145 mcg by mouth daily as needed.   . midodrine (PROAMATINE) 10 MG tablet Take 1 tablet (10 mg total) by mouth every Monday, Wednesday, and Friday with hemodialysis.  Marland Kitchen  multivitamin (RENA-VIT) TABS tablet Take 1 tablet by mouth at bedtime.  . pantoprazole (PROTONIX) 40 MG tablet Take 1 tablet (40 mg total) 2 (two) times daily before a meal by mouth.  Marland Kitchen PARoxetine (PAXIL) 10 MG tablet Take 1 tablet (10 mg total) daily by mouth.  . polyethylene glycol (MIRALAX / GLYCOLAX) packet Take 17 g daily by mouth.  . sevelamer carbonate (RENVELA) 800 MG tablet Take 800 mg by mouth 3 (three) times daily with meals.  . simvastatin (ZOCOR) 40 MG tablet Take 40 mg by mouth daily at 6 PM.  . sucralfate (CARAFATE) 1 GM/10ML suspension Take 10 mLs (1 g total) 4 (four) times daily -  with meals and at bedtime by mouth.  . thiamine (VITAMIN B-1) 100 MG tablet Take 1 tablet (100 mg total) by mouth daily.  Marland Kitchen triamcinolone cream (KENALOG) 0.1 % Apply 1 application topically every 12 (twelve) hours as needed.   . warfarin (COUMADIN) 5 MG tablet Take 5 mg by mouth daily at 6 PM.   No facility-administered encounter medications on file as of 10/31/2017.     PHYSICAL EXAM:   General: Well nourished elderly female sitting in recliner,  In NAD Cardiovascular: regular rate and rhythm Pulmonary: clear throughout.  Unlabored respirations Abdomen: soft, nontender, + bowel sounds Extremities: Good thrill and bruit of fistula of L anterior thigh, no edema Skin: Exposed skin is intact Neurological: Alert and oriented x 3, difficult to recall specific details.  Generalized weakness   Gonzella Lex, NP-C

## 2017-11-07 ENCOUNTER — Other Ambulatory Visit (HOSPITAL_COMMUNITY): Payer: Self-pay | Admitting: Nephrology

## 2017-11-07 DIAGNOSIS — N186 End stage renal disease: Secondary | ICD-10-CM

## 2017-11-09 ENCOUNTER — Ambulatory Visit (HOSPITAL_COMMUNITY): Payer: 59

## 2017-11-14 ENCOUNTER — Ambulatory Visit: Payer: 59 | Admitting: Podiatry

## 2017-11-15 ENCOUNTER — Other Ambulatory Visit: Payer: Self-pay | Admitting: Student

## 2017-11-16 ENCOUNTER — Other Ambulatory Visit (HOSPITAL_COMMUNITY): Payer: Self-pay | Admitting: Nephrology

## 2017-11-16 ENCOUNTER — Ambulatory Visit (HOSPITAL_COMMUNITY)
Admission: RE | Admit: 2017-11-16 | Discharge: 2017-11-16 | Disposition: A | Discharge status: Home / Self Care | Payer: 59 | Source: Ambulatory Visit | Attending: Nephrology | Admitting: Nephrology

## 2017-11-16 ENCOUNTER — Encounter (HOSPITAL_COMMUNITY): Payer: Self-pay | Admitting: Interventional Radiology

## 2017-11-16 DIAGNOSIS — Z9889 Other specified postprocedural states: Secondary | ICD-10-CM | POA: Diagnosis not present

## 2017-11-16 DIAGNOSIS — N186 End stage renal disease: Secondary | ICD-10-CM | POA: Diagnosis not present

## 2017-11-16 DIAGNOSIS — Y832 Surgical operation with anastomosis, bypass or graft as the cause of abnormal reaction of the patient, or of later complication, without mention of misadventure at the time of the procedure: Secondary | ICD-10-CM | POA: Diagnosis not present

## 2017-11-16 DIAGNOSIS — I5032 Chronic diastolic (congestive) heart failure: Secondary | ICD-10-CM | POA: Insufficient documentation

## 2017-11-16 DIAGNOSIS — Z992 Dependence on renal dialysis: Secondary | ICD-10-CM | POA: Insufficient documentation

## 2017-11-16 DIAGNOSIS — Z9049 Acquired absence of other specified parts of digestive tract: Secondary | ICD-10-CM | POA: Diagnosis not present

## 2017-11-16 DIAGNOSIS — Z8249 Family history of ischemic heart disease and other diseases of the circulatory system: Secondary | ICD-10-CM | POA: Insufficient documentation

## 2017-11-16 DIAGNOSIS — Z803 Family history of malignant neoplasm of breast: Secondary | ICD-10-CM | POA: Diagnosis not present

## 2017-11-16 DIAGNOSIS — T82858A Stenosis of vascular prosthetic devices, implants and grafts, initial encounter: Secondary | ICD-10-CM | POA: Insufficient documentation

## 2017-11-16 DIAGNOSIS — E1122 Type 2 diabetes mellitus with diabetic chronic kidney disease: Secondary | ICD-10-CM | POA: Insufficient documentation

## 2017-11-16 DIAGNOSIS — Z86718 Personal history of other venous thrombosis and embolism: Secondary | ICD-10-CM | POA: Insufficient documentation

## 2017-11-16 DIAGNOSIS — Z833 Family history of diabetes mellitus: Secondary | ICD-10-CM | POA: Diagnosis not present

## 2017-11-16 DIAGNOSIS — E785 Hyperlipidemia, unspecified: Secondary | ICD-10-CM | POA: Diagnosis not present

## 2017-11-16 DIAGNOSIS — I1311 Hypertensive heart and chronic kidney disease without heart failure, with stage 5 chronic kidney disease, or end stage renal disease: Secondary | ICD-10-CM | POA: Insufficient documentation

## 2017-11-16 HISTORY — PX: IR AV DIALY SHUNT INTRO NEEDLE/INTRACATH INITIAL W/PTA/IMG LEFT: IMG6103

## 2017-11-16 MED ORDER — LIDOCAINE HCL 1 % IJ SOLN
INTRAMUSCULAR | Status: AC
  Filled 2017-11-16: qty 20

## 2017-11-16 MED ORDER — HEPARIN SODIUM (PORCINE) 1000 UNIT/ML IJ SOLN
INTRAMUSCULAR | Status: AC
  Filled 2017-11-16: qty 1

## 2017-11-16 MED ORDER — HEPARIN SODIUM (PORCINE) 1000 UNIT/ML IJ SOLN
INTRAMUSCULAR | Status: AC | PRN
  Administered 2017-11-16: 3000 [IU] via INTRAVENOUS

## 2017-11-16 MED ORDER — LIDOCAINE HCL 1 % IJ SOLN
INTRAMUSCULAR | Status: AC | PRN
  Administered 2017-11-16: 10 mL

## 2017-11-16 MED ORDER — IOPAMIDOL (ISOVUE-300) INJECTION 61%
INTRAVENOUS | Status: AC
  Administered 2017-11-16: 50 mL
  Filled 2017-11-16: qty 100

## 2017-11-20 ENCOUNTER — Encounter (HOSPITAL_COMMUNITY): Payer: Self-pay | Admitting: Diagnostic Radiology

## 2017-11-21 NOTE — Telephone Encounter (Signed)
Pls call ok to get another opinion from [facility] or [employer] thx

## 2017-11-21 NOTE — Telephone Encounter (Signed)
IC they wanted to hold off at this time since patient is getting HHPT.

## 2017-11-30 ENCOUNTER — Ambulatory Visit (HOSPITAL_COMMUNITY): Payer: 59

## 2017-12-05 ENCOUNTER — Encounter: Payer: Self-pay | Admitting: Podiatry

## 2017-12-05 ENCOUNTER — Ambulatory Visit (INDEPENDENT_AMBULATORY_CARE_PROVIDER_SITE_OTHER): Payer: 59 | Admitting: Podiatry

## 2017-12-05 DIAGNOSIS — E1142 Type 2 diabetes mellitus with diabetic polyneuropathy: Secondary | ICD-10-CM

## 2017-12-05 DIAGNOSIS — M79674 Pain in right toe(s): Secondary | ICD-10-CM

## 2017-12-05 DIAGNOSIS — B351 Tinea unguium: Secondary | ICD-10-CM

## 2017-12-05 DIAGNOSIS — M79675 Pain in left toe(s): Secondary | ICD-10-CM

## 2017-12-05 DIAGNOSIS — D689 Coagulation defect, unspecified: Secondary | ICD-10-CM | POA: Diagnosis not present

## 2017-12-05 NOTE — Progress Notes (Signed)
This patient presents to the office with chief complaint of long thick nails and diabetic feet.  This patient  says there  is  no pain and discomfort in their feet.  This patient says there are long thick painful nails.  These nails are painful walking and wearing shoes.  Patient has no history of infection or drainage from both feet.  Patient is unable to  self treat her own nails . This patient presents  to the office today for treatment of the  long nails and a foot evaluation due to history of  diabetes.  General Appearance  Alert, conversant and in no acute stress.  Vascular  Dorsalis pedis and posterior tibial  pulses are palpable  bilaterally.  Capillary return is within normal limits  bilaterally. Temperature is within normal limits  bilaterally.  Neurologic  Senn-Weinstein monofilament wire test within normal limits  bilaterally. Muscle power diminished  bilaterally.  Nails Thick disfigured discolored nails with subungual debris  from hallux to fifth toes bilaterally. No evidence of bacterial infection or drainage bilaterally.  Orthopedic  No limitations of motion of motion feet .  No crepitus or effusions noted.  No bony pathology or digital deformities noted.  Skin  normotropic skin with no porokeratosis noted bilaterally.  No signs of infections or ulcers noted.     Onychomycosis  Diabetes with no foot complications  IE  Debride nails x 10.  A diabetic foot exam was performed and there is no evidence of any vascular or neurologic pathology.   RTC 3 months.   Gardiner Barefoot DPM

## 2017-12-07 ENCOUNTER — Encounter (HOSPITAL_COMMUNITY): Payer: Self-pay

## 2017-12-07 ENCOUNTER — Ambulatory Visit (HOSPITAL_COMMUNITY)
Admission: RE | Admit: 2017-12-07 | Discharge: 2017-12-07 | Disposition: A | Discharge status: Home / Self Care | Payer: 59 | Source: Ambulatory Visit | Attending: Internal Medicine | Admitting: Internal Medicine

## 2017-12-07 DIAGNOSIS — E1122 Type 2 diabetes mellitus with diabetic chronic kidney disease: Secondary | ICD-10-CM | POA: Diagnosis present

## 2017-12-07 DIAGNOSIS — Z4901 Encounter for fitting and adjustment of extracorporeal dialysis catheter: Secondary | ICD-10-CM | POA: Insufficient documentation

## 2017-12-07 DIAGNOSIS — N186 End stage renal disease: Secondary | ICD-10-CM | POA: Diagnosis not present

## 2017-12-07 MED ORDER — HEPARIN SOD (PORK) LOCK FLUSH 100 UNIT/ML IV SOLN
500.0000 [IU] | INTRAVENOUS | Status: DC
  Administered 2017-12-07: 500 [IU] via INTRAVENOUS
  Filled 2017-12-07: qty 5

## 2017-12-07 NOTE — Discharge Instructions (Signed)
Implanted Port Home Guide An implanted port is a type of central line that is placed under the skin. Central lines are used to provide IV access when treatment or nutrition needs to be given through a person's veins. Implanted ports are used for long-term IV access. An implanted port may be placed because:  You need IV medicine that would be irritating to the small veins in your hands or arms.  You need long-term IV medicines, such as antibiotics.  You need IV nutrition for a long period.  You need frequent blood draws for lab tests.  You need dialysis.  Implanted ports are usually placed in the chest area, but they can also be placed in the upper arm, the abdomen, or the leg. An implanted port has two main parts:  Reservoir. The reservoir is round and will appear as a small, raised area under your skin. The reservoir is the part where a needle is inserted to give medicines or draw blood.  Catheter. The catheter is a thin, flexible tube that extends from the reservoir. The catheter is placed into a large vein. Medicine that is inserted into the reservoir goes into the catheter and then into the vein.  How will I care for my incision site? Do not get the incision site wet. Bathe or shower as directed by your health care provider. How is my port accessed? Special steps must be taken to access the port:  Before the port is accessed, a numbing cream can be placed on the skin. This helps numb the skin over the port site.  Your health care provider uses a sterile technique to access the port. ? Your health care provider must put on a mask and sterile gloves. ? The skin over your port is cleaned carefully with an antiseptic and allowed to dry. ? The port is gently pinched between sterile gloves, and a needle is inserted into the port.  Only "non-coring" port needles should be used to access the port. Once the port is accessed, a blood return should be checked. This helps ensure that the port  is in the vein and is not clogged.  If your port needs to remain accessed for a constant infusion, a clear (transparent) bandage will be placed over the needle site. The bandage and needle will need to be changed every week, or as directed by your health care provider.  Keep the bandage covering the needle clean and dry. Do not get it wet. Follow your health care provider's instructions on how to take a shower or bath while the port is accessed.  If your port does not need to stay accessed, no bandage is needed over the port.  What is flushing? Flushing helps keep the port from getting clogged. Follow your health care provider's instructions on how and when to flush the port. Ports are usually flushed with saline solution or a medicine called heparin. The need for flushing will depend on how the port is used.  If the port is used for intermittent medicines or blood draws, the port will need to be flushed: ? After medicines have been given. ? After blood has been drawn. ? As part of routine maintenance.  If a constant infusion is running, the port may not need to be flushed.  How long will my port stay implanted? The port can stay in for as long as your health care provider thinks it is needed. When it is time for the port to come out, surgery will be   done to remove it. The procedure is similar to the one performed when the port was put in. When should I seek immediate medical care? When you have an implanted port, you should seek immediate medical care if:  You notice a bad smell coming from the incision site.  You have swelling, redness, or drainage at the incision site.  You have more swelling or pain at the port site or the surrounding area.  You have a fever that is not controlled with medicine.  This information is not intended to replace advice given to you by your health care provider. Make sure you discuss any questions you have with your health care provider. Document  Released: 06/06/2005 Document Revised: 11/12/2015 Document Reviewed: 02/11/2013 Elsevier Interactive Patient Education  2017 [employer] Inc[employer]  

## 2017-12-07 NOTE — Progress Notes (Signed)
Ashlee Miles presented for Portacath access and flush. Proper placement of portacath confirmed by CXR. Portacath located right upper chest wall accessed with  PH 20 needle. Clean, Dry and Intact Good blood return present. Portacath flushed with 56ml NS and 500U/33ml Heparin per protocol and needle removed intact. Procedure without incident. Patient tolerated procedure well.

## 2017-12-19 ENCOUNTER — Telehealth (INDEPENDENT_AMBULATORY_CARE_PROVIDER_SITE_OTHER): Payer: Self-pay | Admitting: Orthopedic Surgery

## 2017-12-19 DIAGNOSIS — N2581 Secondary hyperparathyroidism of renal origin: Secondary | ICD-10-CM | POA: Insufficient documentation

## 2017-12-19 NOTE — Telephone Encounter (Signed)
Ashlee Miles, PT, with [facility] called to request a PT extension for 1x for 2 weeks.  CB#[phone].

## 2017-12-20 NOTE — Telephone Encounter (Signed)
IC LM with verbal orders.

## 2017-12-26 ENCOUNTER — Encounter (HOSPITAL_COMMUNITY): Payer: Self-pay | Admitting: Diagnostic Radiology

## 2018-01-08 ENCOUNTER — Encounter (HOSPITAL_COMMUNITY): Payer: 59

## 2018-01-16 ENCOUNTER — Encounter (HOSPITAL_COMMUNITY)
Admission: RE | Admit: 2018-01-16 | Discharge: 2018-01-16 | Disposition: A | Discharge status: Home / Self Care | Payer: 59 | Source: Ambulatory Visit | Attending: Internal Medicine | Admitting: Internal Medicine

## 2018-01-16 ENCOUNTER — Encounter (HOSPITAL_COMMUNITY): Payer: Self-pay

## 2018-01-16 DIAGNOSIS — Z992 Dependence on renal dialysis: Secondary | ICD-10-CM | POA: Insufficient documentation

## 2018-01-16 DIAGNOSIS — N186 End stage renal disease: Secondary | ICD-10-CM | POA: Insufficient documentation

## 2018-01-16 MED ORDER — HEPARIN SOD (PORK) LOCK FLUSH 100 UNIT/ML IV SOLN
500.0000 [IU] | INTRAVENOUS | Status: AC
  Administered 2018-01-16: 500 [IU] via INTRAVENOUS
  Filled 2018-01-16: qty 5

## 2018-01-16 MED ORDER — SODIUM CHLORIDE 0.9% FLUSH: 10.0000 mL | Freq: Once | INTRAVENOUS | Status: DC

## 2018-01-18 ENCOUNTER — Encounter (INDEPENDENT_AMBULATORY_CARE_PROVIDER_SITE_OTHER): Payer: Self-pay | Admitting: Orthopedic Surgery

## 2018-01-18 ENCOUNTER — Other Ambulatory Visit: Payer: Self-pay | Admitting: Internal Medicine

## 2018-01-18 ENCOUNTER — Ambulatory Visit (INDEPENDENT_AMBULATORY_CARE_PROVIDER_SITE_OTHER): Payer: 59 | Admitting: Orthopedic Surgery

## 2018-01-18 DIAGNOSIS — Z1231 Encounter for screening mammogram for malignant neoplasm of breast: Secondary | ICD-10-CM

## 2018-01-18 DIAGNOSIS — M25532 Pain in left wrist: Secondary | ICD-10-CM

## 2018-01-18 NOTE — Progress Notes (Signed)
Office Visit Note   Patient: Ashlee Miles           Date of Birth: 07/13/1940           MRN: [id number] Visit Date: 01/18/2018 Requested by: Darlina Rumpf, MD [address] [place], [place] [address] PCP: [name], MD  Subjective: Chief Complaint  Patient presents with  . Left Wrist - Follow-up    HPI: [name] presents for final follow-up of left wrist fracture.  She had a mildly displaced left wrist fracture of which CT scan in May showed near complete healing.  She is having some continued pain.  She is been wearing a splint.              ROS: All systems reviewed are negative as they relate to the chief complaint within the history of present illness.  Patient denies  fevers or chills.   Assessment & Plan: Visit Diagnoses:  1. Pain in left wrist     Plan: Impression is healed left wrist fracture with some residual pain.  I do not think there is really any intervention to be done at this time.  I like her to stay out of the wrist splint and start working on range of motion exercises.  She has done physical therapy for 6 visits.  I think she is going to have some symptomatic functional limitations following this fracture.  Follow-Up Instructions: Return if symptoms worsen or fail to improve.   Orders:  No orders of the defined types were placed in this encounter.  No orders of the defined types were placed in this encounter.     Procedures: No procedures performed   Clinical Data: No additional findings.  Objective: Vital Signs: There were no vitals taken for this visit.  Physical Exam:   Constitutional: Patient appears well-developed HEENT:  Head: Normocephalic Eyes:EOM are normal Neck: Normal range of motion Cardiovascular: Normal rate Pulmonary/chest: Effort normal Neurologic: Patient is alert Skin: Skin is warm Psychiatric: Patient has normal mood and affect    Ortho Exam: Ortho exam demonstrates no pain with pronation supination of that  wrist but she only has about 40 or 50 degrees of extension and 40 degrees of flexion.  Grip strength is diminished but radial pulses intact.  Elbow range of motion intact.  Specialty Comments:  No specialty comments available.  Imaging: No results found.   PMFS History: Patient Active Problem List   Diagnosis Date Noted  . Generalized weakness 10/31/2017  . Palliative care encounter 10/31/2017  . Leg wound, left 07/12/2017  . Cellulitis of left lower extremity 07/12/2017  . HLD (hyperlipidemia) 07/12/2017  . Chest pain in adult 06/22/2017  . Elevated troponin   . Altered mental status   . 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 Depression 05/31/2017  . GERD (gastroesophageal reflux disease) 05/31/2017  . Left groin pain 05/31/2017  . Protein C deficiency ([place])   . 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 Falls 04/25/2017  . Hypotension 04/25/2017  . Epigastric pain 04/25/2017  . Constipation 04/25/2017  . ESRD on dialysis ([place]) 04/06/2017  . Anemia due to chronic kidney disease 04/06/2017  . Thrombocytopenia ([place]) 04/06/2017  . Cough in adult 04/06/2017  . DVT (deep venous thrombosis) ([place]) 04/05/2017  . Pressure injury of skin 03/14/2017  . Acute metabolic encephalopathy 32/44/0102  . Skin lesion of hand 03/12/2017  . TIA (transient ischemic attack) 03/01/2017  . Syncope 03/01/2017  . Diabetes mellitus with complication ([place]) 72/53/6644  .  Essential hypertension 03/01/2017  . Chronic diastolic CHF (congestive heart failure) ([place]) 03/01/2017   Past Medical History:  Diagnosis Date  . CHF (congestive heart failure) ([place])   . Diabetes mellitus without complication ([place])   . ESRD (end stage renal disease) on dialysis ([place])   . History of pituitary adenoma   . Hypertension   . Protein C deficiency ([place])     Family History  Problem Relation Age of Onset  . Breast cancer Mother   . Diabetes Sister   . Diabetes Brother   . CAD Other   . Stroke Neg  Hx     Past Surgical History:  Procedure Laterality Date  . AV FISTULA PLACEMENT Left 03/16/2017   Procedure: INSERTION OF ARTERIOVENOUS (AV) GORE-TEX GRAFT THIGH-LEFT;  Surgeon: Angelia Mould, MD;  Location: [facility];  Service: Vascular;  Laterality: Left;  . CHOLECYSTECTOMY    . DIALYSIS FISTULA CREATION     clotted off  . FALSE ANEURYSM REPAIR Left 05/10/2017   Procedure: REPAIR FALSE ANEURYSM left thigh AVGG using Gore 73mmx5cm Viabahn Endoprosthesis;  Surgeon: Serafina Mitchell, MD;  Location: [facility];  Service: Vascular;  Laterality: Left;  . I&D EXTREMITY Left 07/14/2017   Procedure: IRRIGATION AND DEBRIDEMENT DISTAL THIGH ABSCESS;  Surgeon: Angelia Mould, MD;  Location: [facility];  Service: Vascular;  Laterality: Left;  . INSERTION OF DIALYSIS CATHETER Right 03/16/2017   Procedure: INSERTION OF RIGHT FEMORAL TUNNELED DIALYSIS CATHETER;  Surgeon: Angelia Mould, MD;  Location: [facility];  Service: Vascular;  Laterality: Right;  . IR AV DIALY SHUNT INTRO [place] W/PTA/IMG LEFT  11/16/2017  . IR THROMBECTOMY AV FISTULA W/THROMBOLYSIS/PTA INC/SHUNT/IMG LEFT Left 06/22/2017  . IR US GUIDE VASC ACCESS LEFT  06/22/2017  . IVC FILTER INSERTION    . PORTACATH PLACEMENT    . THROMBECTOMY AND REVISION OF ARTERIOVENTOUS (AV) GORETEX  GRAFT Left 06/02/2017   Procedure: EVACUATION OF LEFT THIGH HEMATOMA  AND APPLICATION OF WOUND VAC;  Surgeon: Serafina Mitchell, MD;  Location[facility];  Service: Vascular;  Laterality: Left;   Social History   Occupational History  . Occupation: retired  Tobacco Use  . Smoking status: Never Smoker  . Smokeless tobacco: Never Used  Substance and Sexual Activity  . Alcohol use: No  . Drug use: No  . Sexual activity: Not on file

## 2018-02-01 ENCOUNTER — Ambulatory Visit (INDEPENDENT_AMBULATORY_CARE_PROVIDER_SITE_OTHER): Payer: 59 | Admitting: Orthopedic Surgery

## 2018-02-08 ENCOUNTER — Encounter (HOSPITAL_COMMUNITY)
Admission: RE | Admit: 2018-02-08 | Discharge: 2018-02-08 | Disposition: A | Discharge status: Home / Self Care | Payer: 59 | Source: Ambulatory Visit | Attending: Internal Medicine | Admitting: Internal Medicine

## 2018-02-08 ENCOUNTER — Encounter (HOSPITAL_COMMUNITY): Payer: Self-pay

## 2018-02-08 ENCOUNTER — Other Ambulatory Visit (HOSPITAL_COMMUNITY): Payer: Self-pay | Admitting: General Practice

## 2018-02-08 DIAGNOSIS — N186 End stage renal disease: Secondary | ICD-10-CM | POA: Insufficient documentation

## 2018-02-08 DIAGNOSIS — Z992 Dependence on renal dialysis: Secondary | ICD-10-CM | POA: Diagnosis not present

## 2018-02-08 MED ORDER — HEPARIN SOD (PORK) LOCK FLUSH 100 UNIT/ML IV SOLN
500.0000 [IU] | INTRAVENOUS | Status: AC
Start: 2018-02-08 — End: 2018-02-08
  Administered 2018-02-08: 500 [IU] via INTRAVENOUS

## 2018-02-08 MED ORDER — SODIUM CHLORIDE 0.9% FLUSH
10.0000 mL | Freq: Once | INTRAVENOUS | Status: AC
  Administered 2018-02-08: 10 mL via INTRAVENOUS

## 2018-02-08 MED ORDER — HEPARIN SOD (PORK) LOCK FLUSH 100 UNIT/ML IV SOLN
INTRAVENOUS | Status: AC
  Administered 2018-02-08: 500 [IU] via INTRAVENOUS
  Filled 2018-02-08: qty 5

## 2018-02-13 ENCOUNTER — Ambulatory Visit
Admission: RE | Admit: 2018-02-13 | Discharge: 2018-02-13 | Disposition: A | Discharge status: Home / Self Care | Payer: 59 | Source: Ambulatory Visit | Attending: Internal Medicine | Admitting: Internal Medicine

## 2018-02-13 DIAGNOSIS — Z1231 Encounter for screening mammogram for malignant neoplasm of breast: Secondary | ICD-10-CM

## 2018-02-15 ENCOUNTER — Ambulatory Visit (INDEPENDENT_AMBULATORY_CARE_PROVIDER_SITE_OTHER): Payer: 59 | Admitting: Orthopedic Surgery

## 2018-03-06 ENCOUNTER — Ambulatory Visit: Payer: 59 | Admitting: Podiatry

## 2018-03-12 ENCOUNTER — Encounter (HOSPITAL_COMMUNITY): Payer: 59

## 2018-03-13 ENCOUNTER — Telehealth: Payer: Self-pay

## 2018-03-13 NOTE — Telephone Encounter (Signed)
Received return call from patient's daughter, Ashlee Miles, to schedule visit with NP. Scheduled for 03/20/18

## 2018-03-13 NOTE — Telephone Encounter (Signed)
VM left for patient's daughter, [name], to offer to schedule a follow up with Palliative NP

## 2018-03-15 ENCOUNTER — Encounter (HOSPITAL_COMMUNITY)
Admission: RE | Admit: 2018-03-15 | Discharge: 2018-03-15 | Disposition: A | Discharge status: Home / Self Care | Payer: 59 | Source: Ambulatory Visit | Attending: Internal Medicine | Admitting: Internal Medicine

## 2018-03-15 ENCOUNTER — Encounter (HOSPITAL_COMMUNITY): Payer: Self-pay

## 2018-03-15 DIAGNOSIS — Z992 Dependence on renal dialysis: Secondary | ICD-10-CM | POA: Diagnosis not present

## 2018-03-15 DIAGNOSIS — N186 End stage renal disease: Secondary | ICD-10-CM | POA: Insufficient documentation

## 2018-03-15 MED ORDER — HEPARIN SOD (PORK) LOCK FLUSH 100 UNIT/ML IV SOLN
500.0000 [IU] | INTRAVENOUS | Status: DC
  Administered 2018-03-15: 500 [IU] via INTRAVENOUS
  Filled 2018-03-15: qty 5

## 2018-03-15 NOTE — Discharge Instructions (Signed)
Implanted Port Home Guide An implanted port is a type of central line that is placed under the skin. Central lines are used to provide IV access when treatment or nutrition needs to be given through a person's veins. Implanted ports are used for long-term IV access. An implanted port may be placed because:  You need IV medicine that would be irritating to the small veins in your hands or arms.  You need long-term IV medicines, such as antibiotics.  You need IV nutrition for a long period.  You need frequent blood draws for lab tests.  You need dialysis.  Implanted ports are usually placed in the chest area, but they can also be placed in the upper arm, the abdomen, or the leg. An implanted port has two main parts:  Reservoir. The reservoir is round and will appear as a small, raised area under your skin. The reservoir is the part where a needle is inserted to give medicines or draw blood.  Catheter. The catheter is a thin, flexible tube that extends from the reservoir. The catheter is placed into a large vein. Medicine that is inserted into the reservoir goes into the catheter and then into the vein.  How will I care for my incision site? Do not get the incision site wet. Bathe or shower as directed by your health care provider. How is my port accessed? Special steps must be taken to access the port:  Before the port is accessed, a numbing cream can be placed on the skin. This helps numb the skin over the port site.  Your health care provider uses a sterile technique to access the port. ? Your health care provider must put on a mask and sterile gloves. ? The skin over your port is cleaned carefully with an antiseptic and allowed to dry. ? The port is gently pinched between sterile gloves, and a needle is inserted into the port.  Only "non-coring" port needles should be used to access the port. Once the port is accessed, a blood return should be checked. This helps ensure that the port  is in the vein and is not clogged.  If your port needs to remain accessed for a constant infusion, a clear (transparent) bandage will be placed over the needle site. The bandage and needle will need to be changed every week, or as directed by your health care provider.  Keep the bandage covering the needle clean and dry. Do not get it wet. Follow your health care provider's instructions on how to take a shower or bath while the port is accessed.  If your port does not need to stay accessed, no bandage is needed over the port.  What is flushing? Flushing helps keep the port from getting clogged. Follow your health care provider's instructions on how and when to flush the port. Ports are usually flushed with saline solution or a medicine called heparin. The need for flushing will depend on how the port is used.  If the port is used for intermittent medicines or blood draws, the port will need to be flushed: ? After medicines have been given. ? After blood has been drawn. ? As part of routine maintenance.  If a constant infusion is running, the port may not need to be flushed.  How long will my port stay implanted? The port can stay in for as long as your health care provider thinks it is needed. When it is time for the port to come out, surgery will be   done to remove it. The procedure is similar to the one performed when the port was put in. When should I seek immediate medical care? When you have an implanted port, you should seek immediate medical care if:  You notice a bad smell coming from the incision site.  You have swelling, redness, or drainage at the incision site.  You have more swelling or pain at the port site or the surrounding area.  You have a fever that is not controlled with medicine.  This information is not intended to replace advice given to you by your health care provider. Make sure you discuss any questions you have with your health care provider. Document  Released: 06/06/2005 Document Revised: 11/12/2015 Document Reviewed: 02/11/2013 Elsevier Interactive Patient Education  2017 [employer] Inc[employer]  

## 2018-03-20 ENCOUNTER — Other Ambulatory Visit: Payer: 59 | Admitting: Internal Medicine

## 2018-03-20 DIAGNOSIS — Z515 Encounter for palliative care: Secondary | ICD-10-CM

## 2018-03-20 DIAGNOSIS — R531 Weakness: Secondary | ICD-10-CM

## 2018-03-20 DIAGNOSIS — R413 Other amnesia: Secondary | ICD-10-CM

## 2018-03-20 NOTE — Progress Notes (Signed)
PALLIATIVE CARE CONSULT VISIT   PATIENT NAME: Ashlee Miles DOB: 1941/05/12 MRN: [id number]  PRIMARY CARE PROVIDER:   Darlina Rumpf, MD  REFERRING PROVIDER:      Darlina Rumpf, MD [address] [place], [place] [address]  RESPONSIBLE PARTY:   [name] Lathon(daughter)[phone]   RECOMMENDATIONS and PLAN:   1. Generalized weakness  R53.1:  Stable with assistance of ADLs and MWF hemodialysis.  Heart/renal healthy meals.  Fall prevention precautions.  2. 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   Continue supportive care as needed(meds, meals, ADLs).  3.  Palliative care encounter  Z51.5:   DNR.  ACP previously completed.  Goals are to continue dialysis and live with daughter/caregiver who is not present during today's visit.  Palliative care will continue to follow,  I spent 30 minutes providing this consultation,  from 12:30pm to 1:20m in the home. More than 50% of the time in this consultation was spent coordinating communication with the patient.Marland Kitchen   HISTORY OF PRESENT ILLNESS: Follow-up with Ashlee Miles  Patient reports no hospitalizations or acute illnesses since last visit.  She continues hemodialysis 3x/week. And has been toloerating treatments well. She continues to require assistance with ADLs, medication administration and meal preparation.  She is ambulatory with use of a walker and is continent.  She has a personal aid for home assistance 7 x/week.  CODE STATUS: DNAR/DNI  PPS: 50% HOSPICE ELIGIBILITY/DIAGNOSIS: TBD  PAST MEDICAL HISTORY:  Past Medical History:  Diagnosis Date  . CHF (congestive heart failure) ([place])   . Diabetes mellitus without complication ([place])   . ESRD (end stage renal disease) on dialysis ([place])   . History of pituitary adenoma   . Hypertension   . Protein C deficiency ([place])     SOCIAL HX:  Social History   Tobacco Use  . Smoking status: Never Smoker  . Smokeless tobacco: Never Used  Substance Use  Topics  . Alcohol use: No    ALLERGIES: No Active Allergies   PERTINENT MEDICATIONS:  Outpatient Encounter Medications as of 10/31/2017  Medication Sig  . Amino Acids-Protein Hydrolys (FEEDING SUPPLEMENT, PRO-STAT SUGAR FREE 64,) LIQD Take 30 mLs by mouth 2 (two) times daily.  . calcitRIOL (ROCALTROL) 0.5 MCG capsule Take 0.5 mcg by mouth daily.  . carvedilol (COREG) 3.125 MG tablet Take 1 tablet (3.125 mg total) by mouth 2 (two) times daily.  . collagenase (SANTYL) ointment Apply topically 2 (two) times daily. Apply to L thigh wound BID. Wet-to-dry dressing on top Santyl BID  . docusate sodium (COLACE) 100 MG capsule Take 1 capsule (100 mg total) by mouth 2 (two) times daily.  . famotidine (PEPCID) 20 MG tablet Take 1 tablet (20 mg total) daily by mouth.  . gabapentin (NEURONTIN) 100 MG capsule Take 1 capsule (100 mg total) by mouth at bedtime.  . insulin aspart protamine- aspart (NOVOLOG MIX 70/30) (70-30) 100 UNIT/ML injection Inject 30-40 Units into the skin See admin instructions. 40 units in the morning and 30 units in the evening or Sliding Scale  . ipratropium-albuterol (DUONEB) 0.5-2.5 (3) MG/3ML SOLN Take 3 mLs by nebulization every 4 (four) hours as needed.  . linaclotide (LINZESS) 145 MCG CAPS capsule Take 145 mcg by mouth daily as needed.   . midodrine (PROAMATINE) 10 MG tablet Take 1 tablet (10 mg total) by mouth every Monday, Wednesday, and Friday with hemodialysis.  Marland Kitchen multivitamin (RENA-VIT) TABS tablet Take 1 tablet by mouth at bedtime.  . pantoprazole (PROTONIX)  40 MG tablet Take 1 tablet (40 mg total) 2 (two) times daily before a meal by mouth.  Marland Kitchen PARoxetine (PAXIL) 10 MG tablet Take 1 tablet (10 mg total) daily by mouth.  . polyethylene glycol (MIRALAX / GLYCOLAX) packet Take 17 g daily by mouth.  . sevelamer carbonate (RENVELA) 800 MG tablet Take 800 mg by mouth 3 (three) times daily with meals.  . simvastatin (ZOCOR) 40 MG tablet Take 40 mg by mouth daily at 6 PM.  .  sucralfate (CARAFATE) 1 GM/10ML suspension Take 10 mLs (1 g total) 4 (four) times daily -  with meals and at bedtime by mouth.  . thiamine (VITAMIN B-1) 100 MG tablet Take 1 tablet (100 mg total) by mouth daily.  Marland Kitchen triamcinolone cream (KENALOG) 0.1 % Apply 1 application topically every 12 (twelve) hours as needed.   . warfarin (COUMADIN) 5 MG tablet Take 5 mg by mouth daily at 6 PM.   No facility-administered encounter medications on file as of 10/31/2017.     PHYSICAL EXAM:   General: Well nourished elderly female sitting in recliner,  In NAD Cardiovascular: regular rate and rhythm Pulmonary: clear throughout.  Unlabored respirations Abdomen: soft, nontender, + bowel sounds Extremities: Good thrill and bruit of fistula of L anterior thigh, no edema. Steady ambulation with walker Skin: Exposed skin is intact Neurological: Alert and oriented x 3.  Generalized weakness   Gonzella Lex, NP-C

## 2018-03-25 DIAGNOSIS — R413 Other amnesia: Secondary | ICD-10-CM | POA: Insufficient documentation

## 2018-03-28 ENCOUNTER — Other Ambulatory Visit: Payer: Self-pay | Admitting: Radiology

## 2018-03-28 ENCOUNTER — Other Ambulatory Visit (HOSPITAL_COMMUNITY): Payer: Self-pay | Admitting: Nephrology

## 2018-03-28 DIAGNOSIS — N186 End stage renal disease: Secondary | ICD-10-CM

## 2018-03-29 ENCOUNTER — Other Ambulatory Visit: Payer: Self-pay | Admitting: Student

## 2018-03-30 ENCOUNTER — Ambulatory Visit (HOSPITAL_COMMUNITY)
Admission: RE | Admit: 2018-03-30 | Discharge: 2018-03-30 | Disposition: A | Discharge status: Home / Self Care | Payer: 59 | Source: Ambulatory Visit | Attending: Nephrology | Admitting: Nephrology

## 2018-03-30 ENCOUNTER — Encounter (HOSPITAL_COMMUNITY): Payer: Self-pay | Admitting: Diagnostic Radiology

## 2018-03-30 ENCOUNTER — Other Ambulatory Visit (HOSPITAL_COMMUNITY): Payer: Self-pay | Admitting: Nephrology

## 2018-03-30 ENCOUNTER — Other Ambulatory Visit: Payer: Self-pay

## 2018-03-30 DIAGNOSIS — N186 End stage renal disease: Secondary | ICD-10-CM

## 2018-03-30 DIAGNOSIS — Z9049 Acquired absence of other specified parts of digestive tract: Secondary | ICD-10-CM | POA: Insufficient documentation

## 2018-03-30 DIAGNOSIS — D6859 Other primary thrombophilia: Secondary | ICD-10-CM | POA: Insufficient documentation

## 2018-03-30 DIAGNOSIS — Y832 Surgical operation with anastomosis, bypass or graft as the cause of abnormal reaction of the patient, or of later complication, without mention of misadventure at the time of the procedure: Secondary | ICD-10-CM | POA: Diagnosis not present

## 2018-03-30 DIAGNOSIS — E1122 Type 2 diabetes mellitus with diabetic chronic kidney disease: Secondary | ICD-10-CM | POA: Diagnosis not present

## 2018-03-30 DIAGNOSIS — T82868A Thrombosis of vascular prosthetic devices, implants and grafts, initial encounter: Secondary | ICD-10-CM | POA: Insufficient documentation

## 2018-03-30 DIAGNOSIS — I132 Hypertensive heart and chronic kidney disease with heart failure and with stage 5 chronic kidney disease, or end stage renal disease: Secondary | ICD-10-CM | POA: Insufficient documentation

## 2018-03-30 DIAGNOSIS — I509 Heart failure, unspecified: Secondary | ICD-10-CM | POA: Diagnosis not present

## 2018-03-30 DIAGNOSIS — Z992 Dependence on renal dialysis: Secondary | ICD-10-CM | POA: Diagnosis not present

## 2018-03-30 DIAGNOSIS — Z794 Long term (current) use of insulin: Secondary | ICD-10-CM | POA: Insufficient documentation

## 2018-03-30 DIAGNOSIS — Z7901 Long term (current) use of anticoagulants: Secondary | ICD-10-CM | POA: Insufficient documentation

## 2018-03-30 DIAGNOSIS — Z9889 Other specified postprocedural states: Secondary | ICD-10-CM | POA: Diagnosis not present

## 2018-03-30 DIAGNOSIS — Z79899 Other long term (current) drug therapy: Secondary | ICD-10-CM | POA: Insufficient documentation

## 2018-03-30 DIAGNOSIS — Z833 Family history of diabetes mellitus: Secondary | ICD-10-CM | POA: Insufficient documentation

## 2018-03-30 DIAGNOSIS — Z8249 Family history of ischemic heart disease and other diseases of the circulatory system: Secondary | ICD-10-CM | POA: Diagnosis not present

## 2018-03-30 HISTORY — PX: IR US GUIDE VASC ACCESS LEFT: IMG2389

## 2018-03-30 HISTORY — PX: IR THROMBECTOMY AV FISTULA W/THROMBOLYSIS/PTA/STENT INC/SHUNT/IMG LT: IMG6107

## 2018-03-30 LAB — BASIC METABOLIC PANEL
Anion gap: 11 (ref 5–15)
BUN: 59 mg/dL — ABNORMAL HIGH (ref 8–23)
CO2: 24 mmol/L (ref 22–32)
Calcium: 7.4 mg/dL — ABNORMAL LOW (ref 8.9–10.3)
Chloride: 102 mmol/L (ref 98–111)
Creatinine, Ser: 7.72 mg/dL — ABNORMAL HIGH (ref 0.44–1.00)
GFR calc Af Amer: 5 mL/min — ABNORMAL LOW (ref >60)
GFR calc non Af Amer: 5 mL/min — ABNORMAL LOW (ref >60)
Glucose, Bld: 117 mg/dL — ABNORMAL HIGH (ref 70–99)
Potassium: 4.8 mmol/L (ref 3.5–5.1)
Sodium: 137 mmol/L (ref 135–145)

## 2018-03-30 LAB — GLUCOSE, CAPILLARY
Glucose-Capillary: 92 mg/dL (ref 70–99)
Glucose-Capillary: 105 mg/dL — ABNORMAL HIGH (ref 70–99)

## 2018-03-30 LAB — PROTIME-INR
INR: 1.45
Prothrombin Time: 17.5 seconds — ABNORMAL HIGH (ref 11.4–15.2)

## 2018-03-30 MED ORDER — CEFAZOLIN SODIUM-DEXTROSE 2-4 GM/100ML-% IV SOLN
INTRAVENOUS | Status: AC
  Filled 2018-03-30: qty 100

## 2018-03-30 MED ORDER — HEPARIN SODIUM (PORCINE) 1000 UNIT/ML IJ SOLN
INTRAMUSCULAR | Status: AC
  Filled 2018-03-30: qty 1

## 2018-03-30 MED ORDER — SODIUM CHLORIDE 0.9 % IV SOLN
INTRAVENOUS | Status: AC | PRN
  Administered 2018-03-30: 250 mL via INTRAVENOUS

## 2018-03-30 MED ORDER — FLUMAZENIL 0.5 MG/5ML IV SOLN
INTRAVENOUS | Status: AC
  Filled 2018-03-30: qty 5

## 2018-03-30 MED ORDER — NALOXONE HCL 0.4 MG/ML IJ SOLN
INTRAMUSCULAR | Status: AC | PRN
  Administered 2018-03-30: 0.4 mg via INTRAVENOUS

## 2018-03-30 MED ORDER — HEPARIN SODIUM (PORCINE) 1000 UNIT/ML IJ SOLN
INTRAMUSCULAR | Status: AC | PRN
  Administered 2018-03-30: 3000 [IU] via INTRAVENOUS

## 2018-03-30 MED ORDER — HEPARIN SOD (PORK) LOCK FLUSH 100 UNIT/ML IV SOLN
INTRAVENOUS | Status: AC
  Administered 2018-03-30: 500 [IU]
  Filled 2018-03-30: qty 5

## 2018-03-30 MED ORDER — LIDOCAINE HCL 1 % IJ SOLN
INTRAMUSCULAR | Status: AC
  Filled 2018-03-30: qty 20

## 2018-03-30 MED ORDER — FENTANYL CITRATE (PF) 100 MCG/2ML IJ SOLN
INTRAMUSCULAR | Status: AC
  Filled 2018-03-30: qty 4

## 2018-03-30 MED ORDER — MIDAZOLAM HCL 2 MG/2ML IJ SOLN
INTRAMUSCULAR | Status: AC
  Filled 2018-03-30: qty 4

## 2018-03-30 MED ORDER — MIDAZOLAM HCL 2 MG/2ML IJ SOLN
INTRAMUSCULAR | Status: AC
  Filled 2018-03-30: qty 2

## 2018-03-30 MED ORDER — NALOXONE HCL 0.4 MG/ML IJ SOLN
INTRAMUSCULAR | Status: AC
  Filled 2018-03-30: qty 1

## 2018-03-30 MED ORDER — FENTANYL CITRATE (PF) 100 MCG/2ML IJ SOLN
INTRAMUSCULAR | Status: AC | PRN
  Administered 2018-03-30 (×2): 50 ug via INTRAVENOUS
  Administered 2018-03-30 (×3): 25 ug via INTRAVENOUS
  Administered 2018-03-30: 50 ug via INTRAVENOUS

## 2018-03-30 MED ORDER — SODIUM CHLORIDE 0.9% FLUSH: 10.0000 mL | INTRAVENOUS | Status: DC | PRN

## 2018-03-30 MED ORDER — ALTEPLASE 2 MG IJ SOLR
INTRAMUSCULAR | Status: AC
  Filled 2018-03-30: qty 2

## 2018-03-30 MED ORDER — IOPAMIDOL (ISOVUE-300) INJECTION 61%
INTRAVENOUS | Status: AC
  Administered 2018-03-30: 25 mL
  Filled 2018-03-30: qty 50

## 2018-03-30 MED ORDER — IOPAMIDOL (ISOVUE-300) INJECTION 61%
INTRAVENOUS | Status: AC
  Administered 2018-03-30: 30 mL
  Filled 2018-03-30: qty 50

## 2018-03-30 MED ORDER — MIDAZOLAM HCL 2 MG/2ML IJ SOLN
INTRAMUSCULAR | Status: AC | PRN
  Administered 2018-03-30: 0.5 mg via INTRAVENOUS
  Administered 2018-03-30: 1 mg via INTRAVENOUS
  Administered 2018-03-30: 0.5 mg via INTRAVENOUS
  Administered 2018-03-30 (×2): 1 mg via INTRAVENOUS
  Administered 2018-03-30: 0.5 mg via INTRAVENOUS

## 2018-03-30 MED ORDER — IOPAMIDOL (ISOVUE-300) INJECTION 61%
INTRAVENOUS | Status: AC
  Administered 2018-03-30: 35 mL
  Filled 2018-03-30: qty 100

## 2018-03-30 MED ORDER — SODIUM CHLORIDE 0.9 % IV SOLN: INTRAVENOUS | Status: DC

## 2018-03-30 MED ORDER — ALTEPLASE 2 MG IJ SOLR
INTRAMUSCULAR | Status: AC | PRN
  Administered 2018-03-30: 2 mg

## 2018-03-30 MED ORDER — FENTANYL CITRATE (PF) 100 MCG/2ML IJ SOLN
INTRAMUSCULAR | Status: AC
  Filled 2018-03-30: qty 2

## 2018-03-30 MED ORDER — CEFAZOLIN SODIUM-DEXTROSE 2-4 GM/100ML-% IV SOLN
INTRAVENOUS | Status: AC | PRN
  Administered 2018-03-30: 2 g via INTRAVENOUS

## 2018-03-30 MED ORDER — LIDOCAINE HCL (PF) 1 % IJ SOLN
INTRAMUSCULAR | Status: AC | PRN
  Administered 2018-03-30: 20 mL

## 2018-03-30 NOTE — Sedation Documentation (Signed)
During procedure, at approxamately 1516  patient went into respiratory distress and became responsive only to painful stimuli. Patient became hypotensive and began to desaturate with snoring respirations. Patient placed on non rebreather @ 15 liters, 250 cc bolus started of normal saline, and oral pharyngeal airway placed. Patient sternal rubbed and responsive to rubs. IR staff entered room to assist and patient reversed with narcan. Patient awoke, OPA removed and able to respond to all questions appropriately. Patient oxygen saturation normalized and blood pressure increased. Patient placed back on 2L [place] and procedure continued.

## 2018-03-30 NOTE — Discharge Instructions (Signed)
Dialysis Vascular Access Malfunction A vascular access is an entrance to your blood vessels that can be used for dialysis. A vascular access can be made in one of several ways:  Joining an artery to a vein under your skin to make a bigger blood vessel called a fistula.  Joining an artery to a vein under your skin using a soft tube called a graft.  Placing a thin, flexible tube (catheter) in a large vein, usually in your neck.  A vascular access may malfunction or become blocked. What can cause your vascular access to malfunction?  Infection (common).  A blood clot inside a part of the fistula, graft, or catheter. A blood clot can completely or partially block the flow of blood.  A kink in the graft or catheter.  A collection of blood (called a hematoma or bruise) next to the graft or catheter that pushes against it, blocking the flow of blood. What are signs and symptoms of vascular access malfunction?  There is a change in the vibration or pulse of your fistula or graft.  The vibration or pulse of your fistula or graft is gone.  There is new or unusual swelling of the area around the access.  There was an unsuccessful puncture of your access by the dialysis team.  The flow of blood through the fistula, graft, or catheter is too slow for effective dialysis.  When routine dialysis is completed and the needle is removed, bleeding lasts for too long a time. What happens if my vascular access malfunctions? Your health care provider may order blood work, cultures, or an X-ray test in order to learn what may be wrong with your vascular access. The X-ray test involves the injection of a liquid into the vascular access. The liquid shows up on the X-ray and allows your health care provider to see if there is a blockage in the vascular access. Treatment varies depending on the cause of the malfunction:  If the vascular access is infected, your health care provider may prescribe antibiotic  medicine to control the infection.  If a clot is found in the vascular access, you may need surgery to remove the clot.  If a blockage in the vascular access is due to some other cause (such as a kink in a graft), then you will likely need surgery to unblock or replace the graft.  Follow these instructions at home: Follow up with your surgeon or other health care provider if you were instructed to do so. This is very important. Any delay in follow-up could cause permanent dysfunction of the vascular access, which may be dangerous. Contact a health care provider if:  Fever develops.  Swelling and pain around the vascular access gets worse or new pain develops.  Pain, numbness, or an unusual pale skin color develops in the hand on the side of your vascular access. Get help right away if: Unusual bleeding develops at the location of the vascular access. This information is not intended to replace advice given to you by your health care provider. Make sure you discuss any questions you have with your health care provider. Document Released: 05/09/2006 Document Revised: 11/12/2015 Document Reviewed: 11/08/2012 Elsevier Interactive Patient Education  2017 [place]. 03/30/2018   Procedure   The sedative medications given during your procedure may have effects lasting up to 24 hours.  You may have some drowsiness, dizziness, difficulty with walking, making decisions and memory problems.  No activities should be taken that require your best effort  during the next 24 hours.  For your safety it is important that you understand and follow these instructions.  1. Do not drive any form of motorized equipment such as cars, trucks, yard tractors, golf carts, motorcycles and mopeds. 2. Do not drink alcohol. 3. Do not operate machinery or equipment such as stove, lawnmower and chainsaw. 4. Do not make any important decisions such as signing any legal documents. 5. You may become dizzy, especially  when changing positions.  Move slowly and take your time.  Avoid stairs if possible. 6. Resume your normal diet when you feel ready to eat.  If you feel queasy, drink small amounts of uncarbonated clear liquids such as tea, orange or apple juice, clear broth or Gatorade to avoid getting dehydrated.  Then progress to bland soup, toast or crackers, then resume your normal diet again.  Additional instructions to follow include:    These instructions have been explained and understood by me or by a responsible person.  I have received a copy to take home with me.  I have a responsible adult to drive me home and stay with me when I return home.  All of my questions have been answered for me or for a responsible person.

## 2018-03-30 NOTE — H&P (Signed)
Chief Complaint: Patient was seen in consultation today for clotted left thigh AV graft  Referring Physician(s): Dunham,Cynthia  Supervising Physician: Markus Daft  Patient Status: [facility] - Out-pt  History of Present Illness: Ashlee Miles is a 77 y.o. 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  She has had multiple fistulas and catheters in the past all which have become clotted and ineffective over time.  She has undergone declotting with IR in the past with restoration of flow.  Patient presented for dialysis Wednesday, however was found with clotted access. Her last successful dialysis session was Monday.  She presents to IR today for declotting procedure.   She has been NPO.  She requests her Port-A-Cath be accessed for labs.   Past Medical History:  Diagnosis Date  . CHF (congestive heart failure) ([place])   . Diabetes mellitus without complication ([place])   . ESRD (end stage renal disease) on dialysis ([place])   . History of pituitary adenoma   . Hypertension   . Protein C deficiency [facility])     Past Surgical History:  Procedure Laterality Date  . AV FISTULA PLACEMENT Left 03/16/2017   Procedure: INSERTION OF ARTERIOVENOUS (AV) GORE-TEX GRAFT THIGH-LEFT;  Surgeon: Angelia Mould, MD;  Location: [place];  Service: Vascular;  Laterality: Left;  . CHOLECYSTECTOMY    . DIALYSIS FISTULA CREATION     clotted off  . FALSE ANEURYSM REPAIR Left 05/10/2017   Procedure: REPAIR FALSE ANEURYSM left thigh AVGG using Gore 31mmx5cm Viabahn Endoprosthesis;  Surgeon: Serafina Mitchell, MD;  Location: [place];  Service: Vascular;  Laterality: Left;  . I&D EXTREMITY Left 07/14/2017   Procedure: IRRIGATION AND DEBRIDEMENT DISTAL THIGH ABSCESS;  Surgeon: Angelia Mould, MD;  Location: [place];  Service: Vascular;  Laterality: Left;  . INSERTION OF DIALYSIS CATHETER Right 03/16/2017   Procedure: INSERTION OF  RIGHT FEMORAL TUNNELED DIALYSIS CATHETER;  Surgeon: Angelia Mould, MD;  Location: [place];  Service: Vascular;  Laterality: Right;  . IR AV DIALY SHUNT INTRO [place] W/PTA/IMG LEFT  11/16/2017  . IR THROMBECTOMY AV FISTULA W/THROMBOLYSIS/PTA INC/SHUNT/IMG LEFT Left 06/22/2017  . IR US GUIDE VASC ACCESS LEFT  06/22/2017  . IVC FILTER INSERTION    . PORTACATH PLACEMENT    . THROMBECTOMY AND REVISION OF ARTERIOVENTOUS (AV) GORETEX  GRAFT Left 06/02/2017   Procedure: EVACUATION OF LEFT THIGH HEMATOMA  AND APPLICATION OF WOUND VAC;  Surgeon: Serafina Mitchell, MD;  Location: [place];  Service: Vascular;  Laterality: Left;    Allergies: Patient has no active allergies.  Medications: Prior to Admission medications   Medication Sig Start Date End Date Taking? Authorizing Provider  Amino Acids-Protein Hydrolys (FEEDING SUPPLEMENT, PRO-STAT SUGAR FREE 64,) LIQD Take 30 mLs by mouth 2 (two) times daily. 04/12/17  Yes Sheikh, Omair Latif, DO  calcitRIOL (ROCALTROL) 0.5 MCG capsule Take 0.5 mcg by mouth daily.   Yes [provider]  collagenase (SANTYL) ointment Apply topically 2 (two) times daily. Apply to L thigh wound BID. Wet-to-dry dressing on top Santyl BID 07/21/17  Yes Dessa Phi, DO  docusate sodium (COLACE) 100 MG capsule Take 1 capsule (100 mg total) by mouth 2 (two) times daily. 04/12/17  Yes Sheikh, Omair Latif, DO  famotidine (PEPCID) 20 MG tablet Take 1 tablet (20 mg total) daily by mouth. 04/28/17  Yes Lavina Hamman, MD  gabapentin (NEURONTIN) 100 MG capsule Take 1  capsule (100 mg total) by mouth at bedtime. 07/21/17  Yes Dessa Phi, DO  insulin aspart protamine- aspart (NOVOLOG MIX 70/30) (70-30) 100 UNIT/ML injection Inject 30-40 Units into the skin See admin instructions. 40 units in the morning and 30 units in the evening or Sliding Scale   Yes [provider]  linaclotide (LINZESS) 145 MCG CAPS capsule Take 145 mcg by mouth daily as needed.    Yes  [provider]  midodrine (PROAMATINE) 10 MG tablet Take 1 tablet (10 mg total) by mouth every Monday, Wednesday, and Friday with hemodialysis. 04/14/17  Yes Sheikh, Omair Latif, DO  multivitamin (RENA-VIT) TABS tablet Take 1 tablet by mouth at bedtime. 04/12/17  Yes Sheikh, Omair Latif, DO  pantoprazole (PROTONIX) 40 MG tablet Take 1 tablet (40 mg total) 2 (two) times daily before a meal by mouth. 04/28/17  Yes Lavina Hamman, MD  PARoxetine (PAXIL) 10 MG tablet Take 1 tablet (10 mg total) daily by mouth. 04/28/17  Yes Lavina Hamman, MD  polyethylene glycol [facility] / GLYCOLAX) packet Take 17 g daily by mouth. 04/28/17  Yes Lavina Hamman, MD  sevelamer carbonate (RENVELA) 800 MG tablet Take 800 mg by mouth 3 (three) times daily with meals.   Yes [provider]  simvastatin (ZOCOR) 40 MG tablet Take 40 mg by mouth daily at 6 PM.   Yes [provider]  sucralfate (CARAFATE) 1 GM/10ML suspension Take 10 mLs (1 g total) 4 (four) times daily -  with meals and at bedtime by mouth. 04/28/17  Yes Lavina Hamman, MD  thiamine (VITAMIN B-1) 100 MG tablet Take 1 tablet (100 mg total) by mouth daily. 06/09/17  Yes Dessa Phi, DO  warfarin (COUMADIN) 5 MG tablet Take 5 mg by mouth daily at 6 PM.   Yes [provider]  carvedilol (COREG) 3.125 MG tablet Take 1 tablet (3.125 mg total) by mouth 2 (two) times daily. 07/21/17 01/16/18  Dessa Phi, DO  triamcinolone cream (KENALOG) 0.1 % Apply 1 application topically every 12 (twelve) hours as needed.     [provider]     Family History  Problem Relation Age of Onset  . Breast cancer Mother   . Diabetes Sister   . Diabetes Brother   . CAD Other   . Stroke Neg Hx     Social History   Socioeconomic History  . Marital status: Married    Spouse name: Not on file  . Number of children: Not on file  . Years of education: Not on file  . Highest education level: Not on file  Occupational History  .  Occupation: retired  Scientific laboratory technician  . Financial resource strain: Not on file  . Food insecurity:    Worry: Not on file    Inability: Not on file  . Transportation needs:    Medical: Not on file    Non-medical: Not on file  Tobacco Use  . Smoking status: Never Smoker  . Smokeless tobacco: Never Used  Substance and Sexual Activity  . Alcohol use: No  . Drug use: No  . Sexual activity: Not on file  Lifestyle  . Physical activity:    Days per week: 0 days    Minutes per session: Not on file  . Stress: To some extent  Relationships  . Social connections:    Talks on phone: Patient refused    Gets together: Patient refused    Attends religious service: Patient refused  Active member of club or organization: Patient refused    Attends meetings of clubs or organizations: Patient refused    Relationship status: Patient refused  Other Topics Concern  . Not on file  Social History Narrative  . Not on file     Review of Systems: A 12 point ROS discussed and pertinent positives are indicated in the HPI above.  All other systems are negative.  Review of Systems  Constitutional: Negative for fatigue and fever.  Respiratory: Negative for cough and shortness of breath.   Cardiovascular: Negative for chest pain.  Gastrointestinal: Negative for abdominal pain.  Musculoskeletal: Negative for back pain.  Psychiatric/Behavioral: Negative for behavioral problems and confusion.    Vital Signs: BP (!) 186/69   Pulse 62   Temp 97.7 F (36.5 C)   Ht 5\' 2"  (1.575 m)   Wt 207 lb (93.9 kg)   SpO2 100%   BMI 37.86 kg/m   Physical Exam  Constitutional: She is oriented to person, place, and time. She appears well-developed. No distress.  Cardiovascular: Normal rate, regular rhythm and normal heart sounds.  Pulmonary/Chest: Effort normal and breath sounds normal. No respiratory distress.  Abdominal: Soft.  Musculoskeletal:  Left thigh graft with no bruit or thrill  Neurological: She  is alert and oriented to person, place, and time.  Skin: Skin is warm and dry. She is not diaphoretic.  Psychiatric: She has a normal mood and affect. Her behavior is normal. Judgment and thought content normal.  Nursing note and vitals reviewed.    MD Evaluation Airway: WNL Heart: WNL Abdomen: WNL Chest/ Lungs: WNL ASA  Classification: 3 Mallampati/Airway Score: Three   Imaging: No results found.  Labs:  CBC: Recent Labs    07/18/17 0418 07/19/17 1153 07/20/17 0306 07/21/17 0403  WBC 8.5 7.1 5.9 5.9  HGB 10.4* 10.4* 9.7* 10.0*  HCT 32.1* 32.6* 31.9* 31.5*  PLT 202 293 277 291    COAGS: Recent Labs    05/31/17 0012  07/18/17 0418 07/19/17 0413 07/20/17 0306 07/21/17 0403  INR 4.00   < > 2.25 2.66 2.81 2.87  APTT 91*  --   --   --   --   --    < > = values in this interval not displayed.    BMP: Recent Labs    07/18/17 0418 07/19/17 1153 07/20/17 0306 07/21/17 0403  NA 135 132* 136 136  K 3.9 3.5 3.8 3.5  CL 98* 94* 98* 98*  CO2 25 23 26 27   GLUCOSE 99 168* 164* 148*  BUN 23* 33* 13 23*  CALCIUM 7.4* 7.8* 7.7* 7.7*  CREATININE 5.44* 7.66* 4.62* 6.68*  GFRNONAA 7* 5* 8* 5*  GFRAA 8* 5* 10* 6*    LIVER FUNCTION TESTS: Recent Labs    05/31/17 1618  06/06/17 0743 06/12/17 1930 06/22/17 0837  07/14/17 1740 07/17/17 0822 07/18/17 0418 07/19/17 1153  BILITOT 0.8  --  0.4 0.6 0.5  --   --   --   --   --   AST 17  --  18 21 15   --   --   --   --   --   ALT 8*  --  <5* 6* 7*  --   --   --   --   --   ALKPHOS 77  --  69 77 65  --   --   --   --   --   PROT 6.6  --  6.3* 7.0  6.3*  --   --   --   --   --   ALBUMIN 2.9*   < > 2.5* 3.0* 3.0*   < > 2.9* 2.6* 2.6* 2.8*   < > = values in this interval not displayed.    TUMOR MARKERS: No results for input(s): AFPTM, CEA, CA199, CHROMGRNA in the last 8760 hours.  Assessment and Plan: Patient with past medical history of renal failure presents with complaint of clotted left thigh AV graft.  IR  consulted for declotting procedure at the request of Dr. Buelah Manis. Patient presents today in their usual state of health.  She has been NPO and is on coumadin for Protein C deficiency.   Risks and benefits discussed with the patient including, but not limited to bleeding, infection, vascular injury, pulmonary embolism, need for tunneled HD catheter placement or even death.  All of the patient's questions were answered, patient is agreeable to proceed. Consent signed and in chart.   Thank you for this interesting consult.  I greatly enjoyed meeting Ashlee Miles and look forward to participating in their care.  A copy of this report was sent to the requesting provider on this date.  Electronically Signed: Docia Barrier, PA 03/30/2018, 12:40 PM   I spent a total of  30 Minutes   in face to face in clinical consultation, greater than 50% of which was counseling/coordinating care for clotted left thigh AV graft.

## 2018-03-30 NOTE — Procedures (Addendum)
  Pre-operative Diagnosis: Thrombosed left thigh AV graft       Post-operative Diagnosis: 1) Thrombosed left thigh AV graft; 2) Vessel rupture treated with covered stents 3) Acute respiratory distress   Indications: Thrombosed AV graft and needs HD  Procedure: Dialysis graft declot with thrombolysis, thrombectomy, angioplasty and stent placement  Findings: Graft was successful recanalized but critical stenoses along the venous outflow tract.  Extravasation of contrast from vessel rupture in the outflow tract was successful treated with 7 mm x 39 mm Viabahn stent.  Second area of irregularity and possible extravasation was treated with 7 mm x 60 mm Covera stent.  Graft was widely patent at end of procedure.   Complications: Acute respiratory distress during procedure and required reversal of sedation with 0.4 mg of Narcan.       EBL: Minimal  Plan: Plan for discharge to home and dialysis tomorrow.  Sutures can be removed tomorrow at dialysis.

## 2018-04-02 MED FILL — Medication: Qty: 1 | Status: AC

## 2018-04-12 ENCOUNTER — Encounter (HOSPITAL_COMMUNITY): Payer: 59

## 2018-05-07 ENCOUNTER — Other Ambulatory Visit (HOSPITAL_COMMUNITY): Payer: Self-pay | Admitting: General Practice

## 2018-05-08 ENCOUNTER — Encounter: Payer: Self-pay | Admitting: Podiatry

## 2018-05-08 ENCOUNTER — Ambulatory Visit (INDEPENDENT_AMBULATORY_CARE_PROVIDER_SITE_OTHER): Payer: 59 | Admitting: Podiatry

## 2018-05-08 DIAGNOSIS — D689 Coagulation defect, unspecified: Secondary | ICD-10-CM

## 2018-05-08 DIAGNOSIS — M79675 Pain in left toe(s): Secondary | ICD-10-CM

## 2018-05-08 DIAGNOSIS — E1142 Type 2 diabetes mellitus with diabetic polyneuropathy: Secondary | ICD-10-CM | POA: Diagnosis not present

## 2018-05-08 DIAGNOSIS — M79674 Pain in right toe(s): Secondary | ICD-10-CM

## 2018-05-08 DIAGNOSIS — B351 Tinea unguium: Secondary | ICD-10-CM

## 2018-05-08 NOTE — Progress Notes (Signed)
Complaint:  Visit Type: Patient returns to my office for continued preventative foot care services. Complaint: Patient states" my nails have grown long and thick and become painful to walk and wear shoes" Patient has been diagnosed with DM with no foot complications. The patient presents for preventative foot care services. No changes to ROS.  Patient presents to the office with her daughter. Daughter says she needed to work on the nails herself between visits.  Podiatric Exam: Vascular: dorsalis pedis and posterior tibial pulses are palpable bilateral. Capillary return is immediate. Temperature gradient is WNL. Skin turgor WNL  Sensorium: Normal Semmes Weinstein monofilament test. Normal tactile sensation bilaterally. Nail Exam: Pt has thick disfigured discolored nails with subungual debris noted bilateral entire nail hallux through fifth toenails Ulcer Exam: There is no evidence of ulcer or pre-ulcerative changes or infection. Orthopedic Exam: Muscle tone and strength are WNL. No limitations in general ROM. No crepitus or effusions noted. Foot type and digits show no abnormalities. Bony prominences are unremarkable. Skin: No Porokeratosis. No infection or ulcers  Diagnosis:  Onychomycosis, , Pain in right toe, pain in left toes  Treatment & Plan Procedures and Treatment: Consent by patient was obtained for treatment procedures.   Debridement of mycotic and hypertrophic toenails, 1 through 5 bilateral and clearing of subungual debris. No ulceration, no infection noted.  Return Visit-Office Procedure: Patient instructed to return to the office for a follow up visit 3 months for continued evaluation and treatment.    Gardiner Barefoot DPM

## 2018-05-10 ENCOUNTER — Encounter (HOSPITAL_COMMUNITY): Payer: Self-pay

## 2018-05-10 ENCOUNTER — Ambulatory Visit (HOSPITAL_COMMUNITY)
Admission: RE | Admit: 2018-05-10 | Discharge: 2018-05-10 | Disposition: A | Discharge status: Home / Self Care | Payer: 59 | Source: Ambulatory Visit | Attending: Internal Medicine | Admitting: Internal Medicine

## 2018-05-10 DIAGNOSIS — Z992 Dependence on renal dialysis: Secondary | ICD-10-CM | POA: Insufficient documentation

## 2018-05-10 DIAGNOSIS — N186 End stage renal disease: Secondary | ICD-10-CM | POA: Insufficient documentation

## 2018-05-10 DIAGNOSIS — E1122 Type 2 diabetes mellitus with diabetic chronic kidney disease: Secondary | ICD-10-CM | POA: Insufficient documentation

## 2018-05-10 MED ORDER — HEPARIN SOD (PORK) LOCK FLUSH 100 UNIT/ML IV SOLN
500.0000 [IU] | INTRAVENOUS | Status: DC
  Administered 2018-05-10: 500 [IU] via INTRAVENOUS

## 2018-05-10 MED ORDER — HEPARIN SOD (PORK) LOCK FLUSH 100 UNIT/ML IV SOLN
INTRAVENOUS | Status: AC
  Filled 2018-05-10: qty 5

## 2018-05-14 ENCOUNTER — Encounter (HOSPITAL_COMMUNITY): Payer: 59

## 2018-05-16 ENCOUNTER — Encounter (HOSPITAL_COMMUNITY): Payer: 59

## 2018-05-29 ENCOUNTER — Other Ambulatory Visit: Payer: 59 | Admitting: Internal Medicine

## 2018-05-29 IMAGING — DX DG CHEST 1V PORT
1 series · 1 of 1 positions shown · non-contrast
Comparison: Chest radiograph March 01, 2017

CLINICAL DATA: Hypoglycemia.  Poor placement.

EXAM:
PORTABLE CHEST 1 VIEW

[chest]
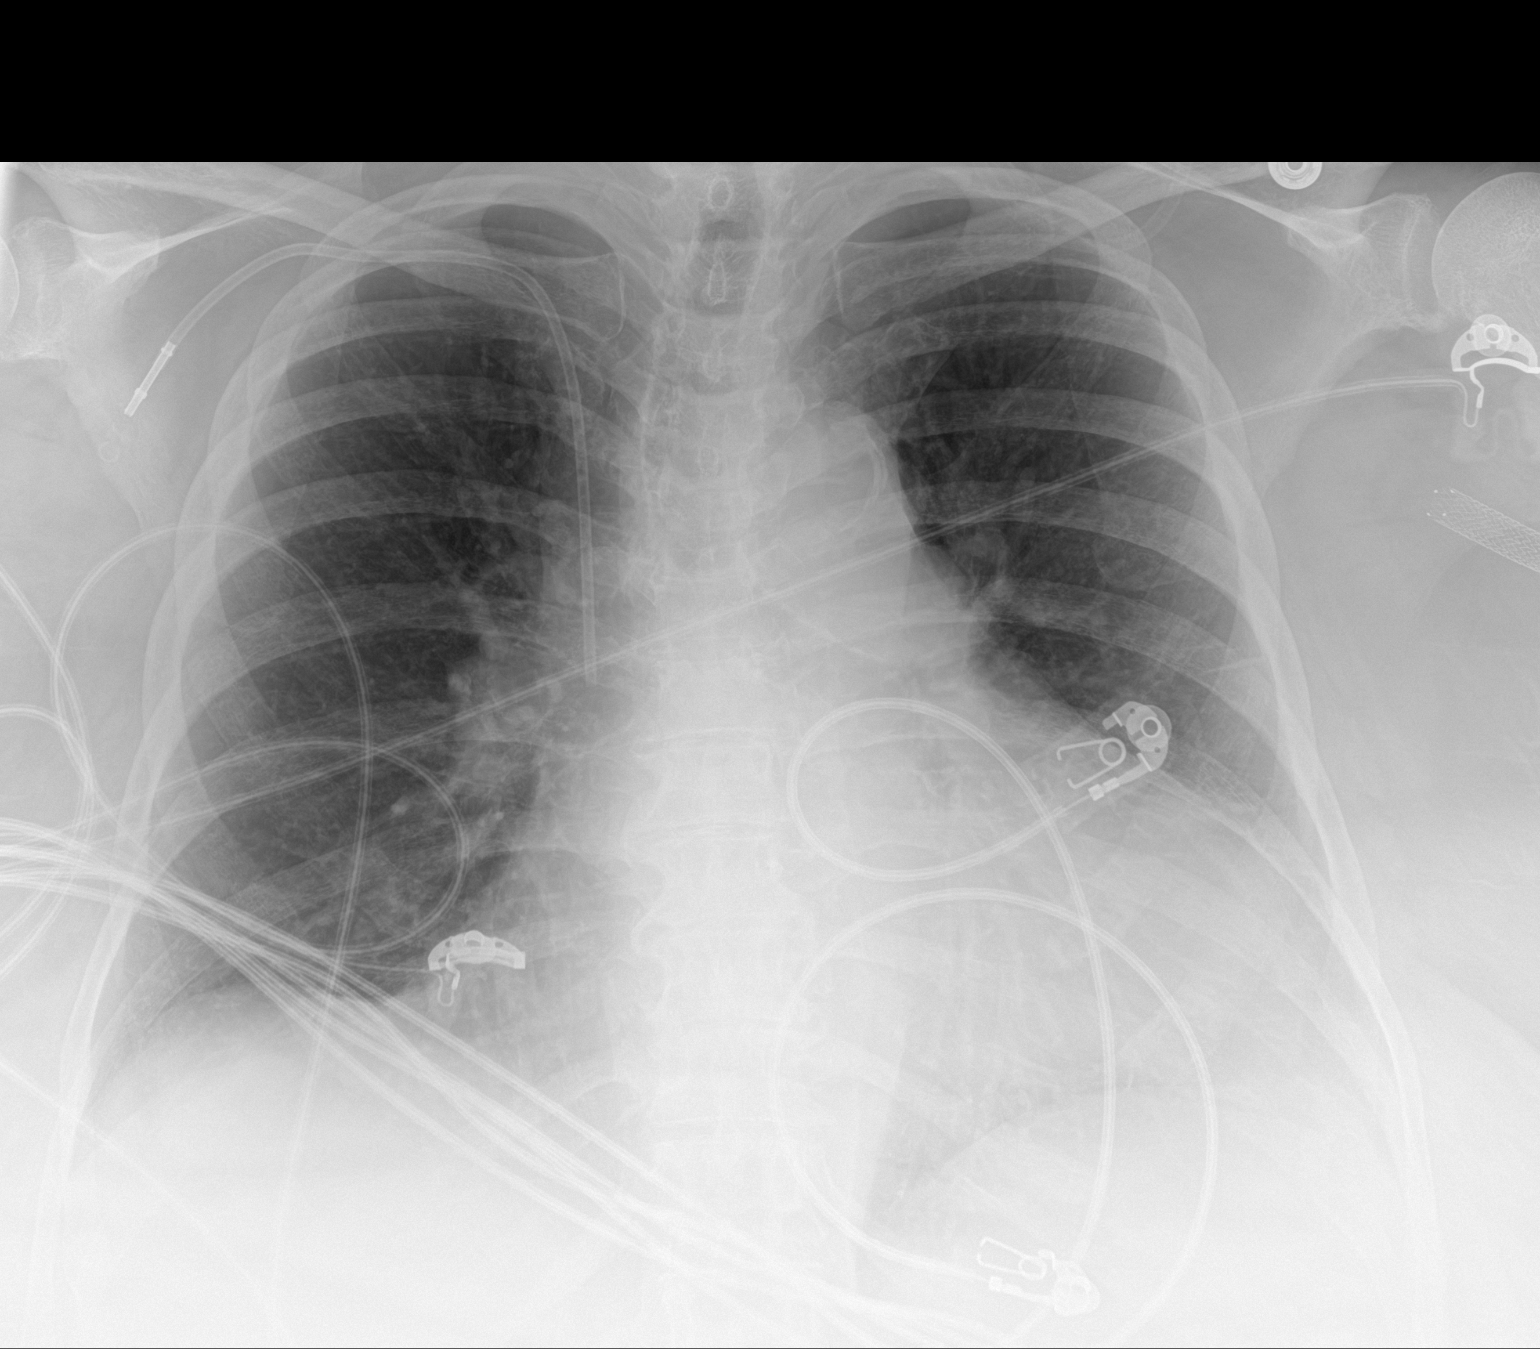

[1 of 1 positions shown; findings below may reference images not displayed]

FINDINGS: Stable cardiomegaly. Calcified aortic knob. No pleural effusion or
focal consolidation. Mild pulmonary vascular congestion. No
pneumothorax. Vascular stent LEFT arm. Stable position of tunneled
RIGHT Port-A-Cath distal tip projecting in mid superior vena cava,
via subclavian approach. ACDF.
IMPRESSION: Stable position of RIGHT Port-A-Cath.

Stable cardiomegaly and pulmonary vascular congestion.

Aortic Atherosclerosis ([id number]).

## 2018-06-07 ENCOUNTER — Ambulatory Visit (HOSPITAL_COMMUNITY): Payer: 59

## 2018-06-15 ENCOUNTER — Encounter (HOSPITAL_COMMUNITY): Payer: 59

## 2018-06-28 ENCOUNTER — Encounter (HOSPITAL_COMMUNITY): Payer: 59

## 2018-07-05 ENCOUNTER — Encounter (HOSPITAL_COMMUNITY): Payer: Self-pay

## 2018-07-05 ENCOUNTER — Ambulatory Visit (HOSPITAL_COMMUNITY)
Admission: RE | Admit: 2018-07-05 | Discharge: 2018-07-05 | Disposition: A | Discharge status: Home / Self Care | Payer: 59 | Source: Ambulatory Visit | Attending: Internal Medicine | Admitting: Internal Medicine

## 2018-07-05 DIAGNOSIS — E1122 Type 2 diabetes mellitus with diabetic chronic kidney disease: Secondary | ICD-10-CM | POA: Diagnosis not present

## 2018-07-05 DIAGNOSIS — N186 End stage renal disease: Secondary | ICD-10-CM | POA: Diagnosis not present

## 2018-07-05 DIAGNOSIS — Z992 Dependence on renal dialysis: Secondary | ICD-10-CM | POA: Insufficient documentation

## 2018-07-05 DIAGNOSIS — Z4901 Encounter for fitting and adjustment of extracorporeal dialysis catheter: Secondary | ICD-10-CM | POA: Insufficient documentation

## 2018-07-05 MED ORDER — HEPARIN SOD (PORK) LOCK FLUSH 100 UNIT/ML IV SOLN
500.0000 [IU] | INTRAVENOUS | Status: DC
  Administered 2018-07-05: 500 [IU] via INTRAVENOUS
  Filled 2018-07-05: qty 5

## 2018-07-10 ENCOUNTER — Other Ambulatory Visit: Payer: 59 | Admitting: Internal Medicine

## 2018-07-17 ENCOUNTER — Encounter: Payer: Self-pay | Admitting: Podiatry

## 2018-07-17 ENCOUNTER — Ambulatory Visit (INDEPENDENT_AMBULATORY_CARE_PROVIDER_SITE_OTHER): Payer: 59 | Admitting: Podiatry

## 2018-07-17 DIAGNOSIS — D689 Coagulation defect, unspecified: Secondary | ICD-10-CM | POA: Diagnosis not present

## 2018-07-17 DIAGNOSIS — M79675 Pain in left toe(s): Secondary | ICD-10-CM | POA: Diagnosis not present

## 2018-07-17 DIAGNOSIS — E1142 Type 2 diabetes mellitus with diabetic polyneuropathy: Secondary | ICD-10-CM | POA: Diagnosis not present

## 2018-07-17 DIAGNOSIS — B351 Tinea unguium: Secondary | ICD-10-CM

## 2018-07-17 DIAGNOSIS — M79674 Pain in right toe(s): Secondary | ICD-10-CM

## 2018-07-17 NOTE — Progress Notes (Signed)
Complaint:  Visit Type: Patient returns to my office for continued preventative foot care services. Complaint: Patient states" my nails have grown long and thick and become painful to walk and wear shoes" Patient has been diagnosed with DM with no foot complications. The patient presents for preventative foot care services. No changes to ROS.  Patient presents to the office with her son. .  Podiatric Exam: Vascular: dorsalis pedis and posterior tibial pulses are palpable bilateral. Capillary return is immediate. Temperature gradient is WNL. Skin turgor WNL  Sensorium: Normal Semmes Weinstein monofilament test. Normal tactile sensation bilaterally. Nail Exam: Pt has thick disfigured discolored nails with subungual debris noted bilateral entire nail hallux through fifth toenails Ulcer Exam: There is no evidence of ulcer or pre-ulcerative changes or infection. Orthopedic Exam: Muscle tone and strength are WNL. No limitations in general ROM. No crepitus or effusions noted. Foot type and digits show no abnormalities. Bony prominences are unremarkable. Skin: No Porokeratosis. No infection or ulcers  Diagnosis:  Onychomycosis, , Pain in right toe, pain in left toes  Treatment & Plan Procedures and Treatment: Consent by patient was obtained for treatment procedures.   Debridement of mycotic and hypertrophic toenails, 1 through 5 bilateral and clearing of subungual debris. No ulceration, no infection noted.  Return Visit-Office Procedure: Patient instructed to return to the office for a follow up visit 3 months for continued evaluation and treatment.  Padding dispensed for bone spur fifth toe right foot.    Gardiner Barefoot DPM

## 2018-08-09 ENCOUNTER — Encounter (HOSPITAL_COMMUNITY): Payer: 59

## 2018-08-16 ENCOUNTER — Encounter (HOSPITAL_COMMUNITY)
Admission: RE | Admit: 2018-08-16 | Discharge: 2018-08-16 | Disposition: A | Discharge status: Home / Self Care | Payer: 59 | Source: Ambulatory Visit | Attending: Internal Medicine | Admitting: Internal Medicine

## 2018-08-16 ENCOUNTER — Encounter (HOSPITAL_COMMUNITY): Payer: Self-pay

## 2018-08-16 DIAGNOSIS — E1122 Type 2 diabetes mellitus with diabetic chronic kidney disease: Secondary | ICD-10-CM | POA: Insufficient documentation

## 2018-08-16 DIAGNOSIS — N186 End stage renal disease: Secondary | ICD-10-CM | POA: Diagnosis not present

## 2018-08-16 MED ORDER — HEPARIN SOD (PORK) LOCK FLUSH 100 UNIT/ML IV SOLN
500.0000 [IU] | Freq: Once | INTRAVENOUS | Status: AC
  Administered 2018-08-16: 500 [IU] via INTRAVENOUS
  Filled 2018-08-16: qty 5

## 2018-09-06 ENCOUNTER — Telehealth: Payer: Self-pay | Admitting: Emergency Medicine

## 2018-09-06 ENCOUNTER — Encounter (HOSPITAL_COMMUNITY): Payer: 59

## 2018-09-08 IMAGING — DX DG CHEST 2V
2 series · 2 of 2 positions shown · non-contrast
Comparison: PA and lateral chest 03/01/2017.

CLINICAL DATA: Chest pain for a while which worsened today.

EXAM:
CHEST  2 VIEW

[chest lat]
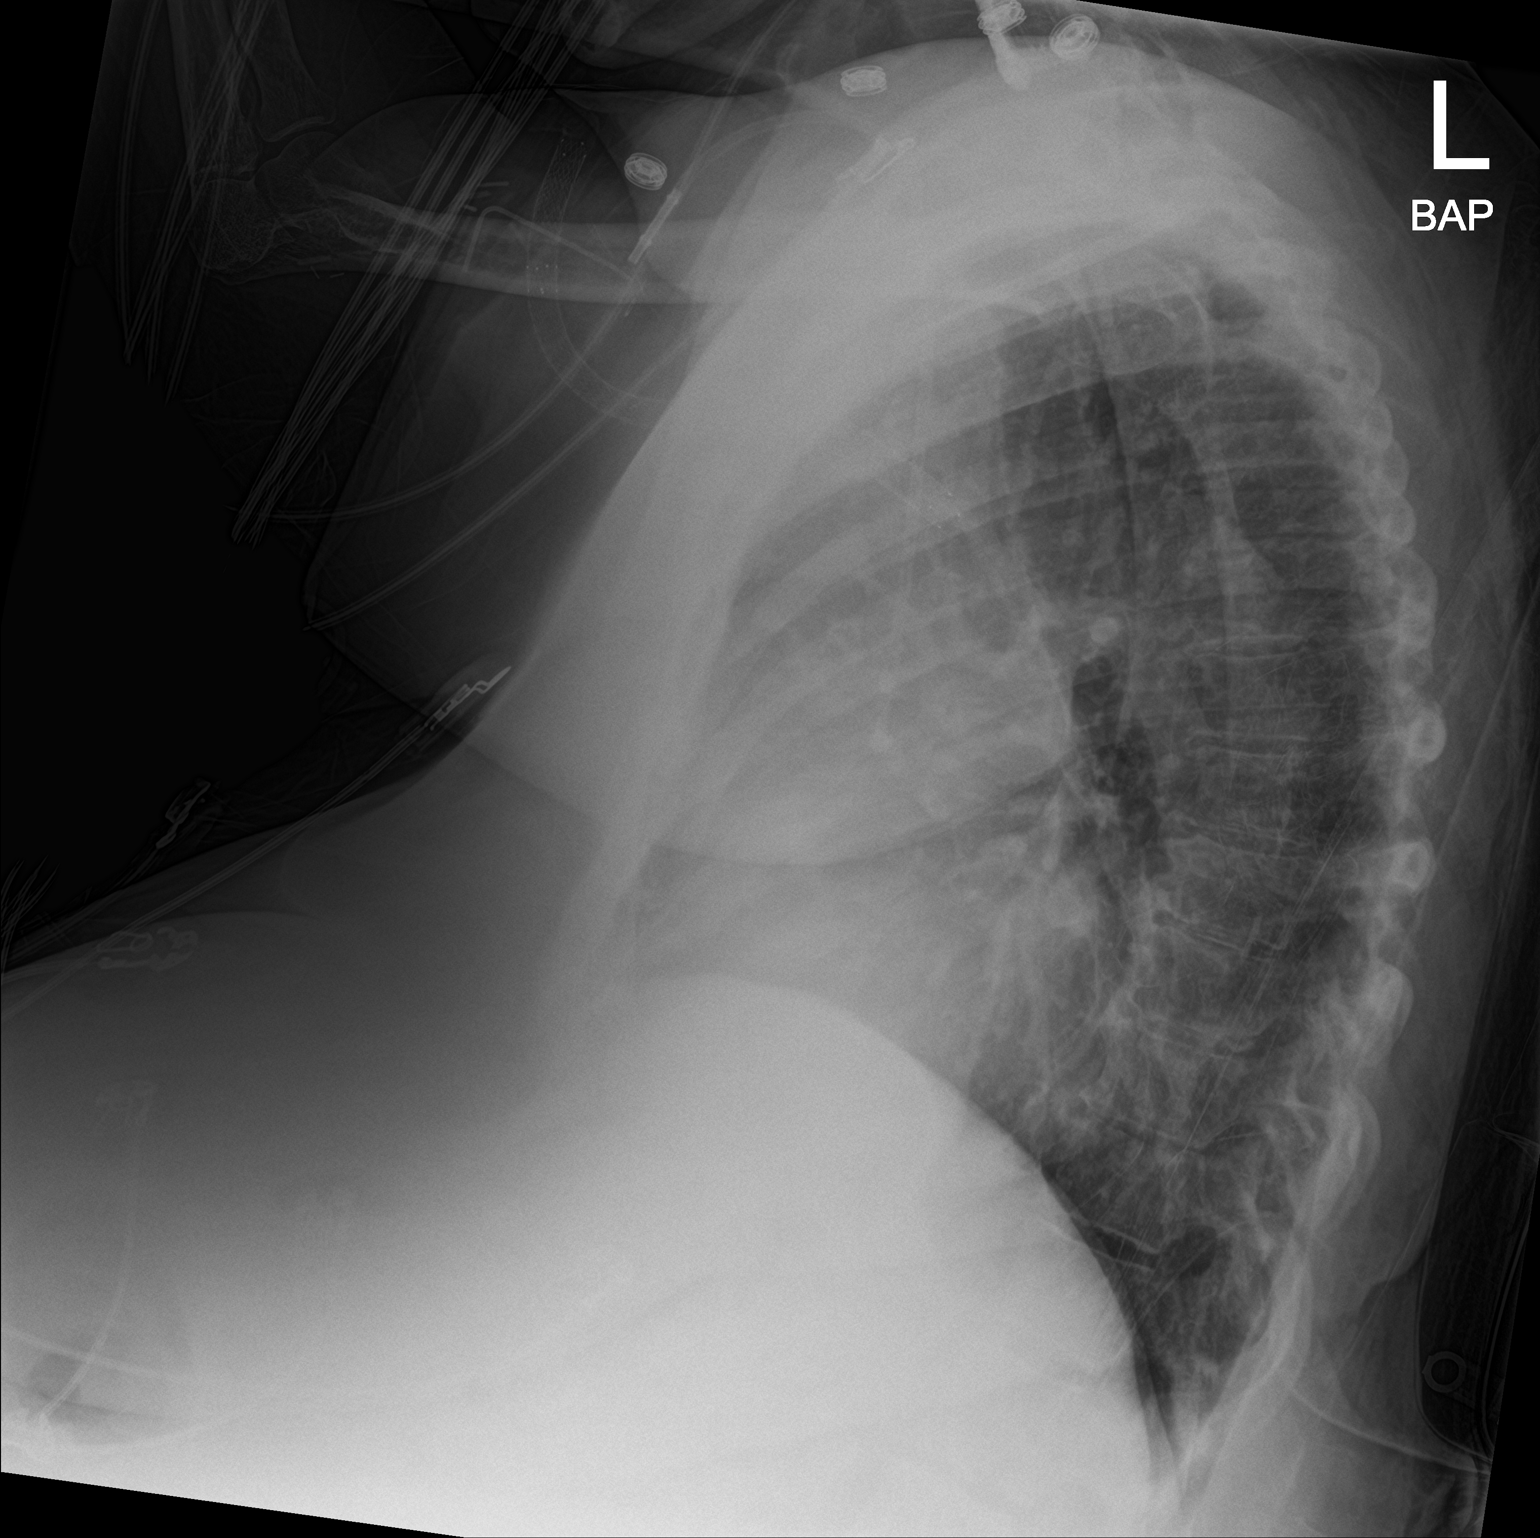

[chest ap]
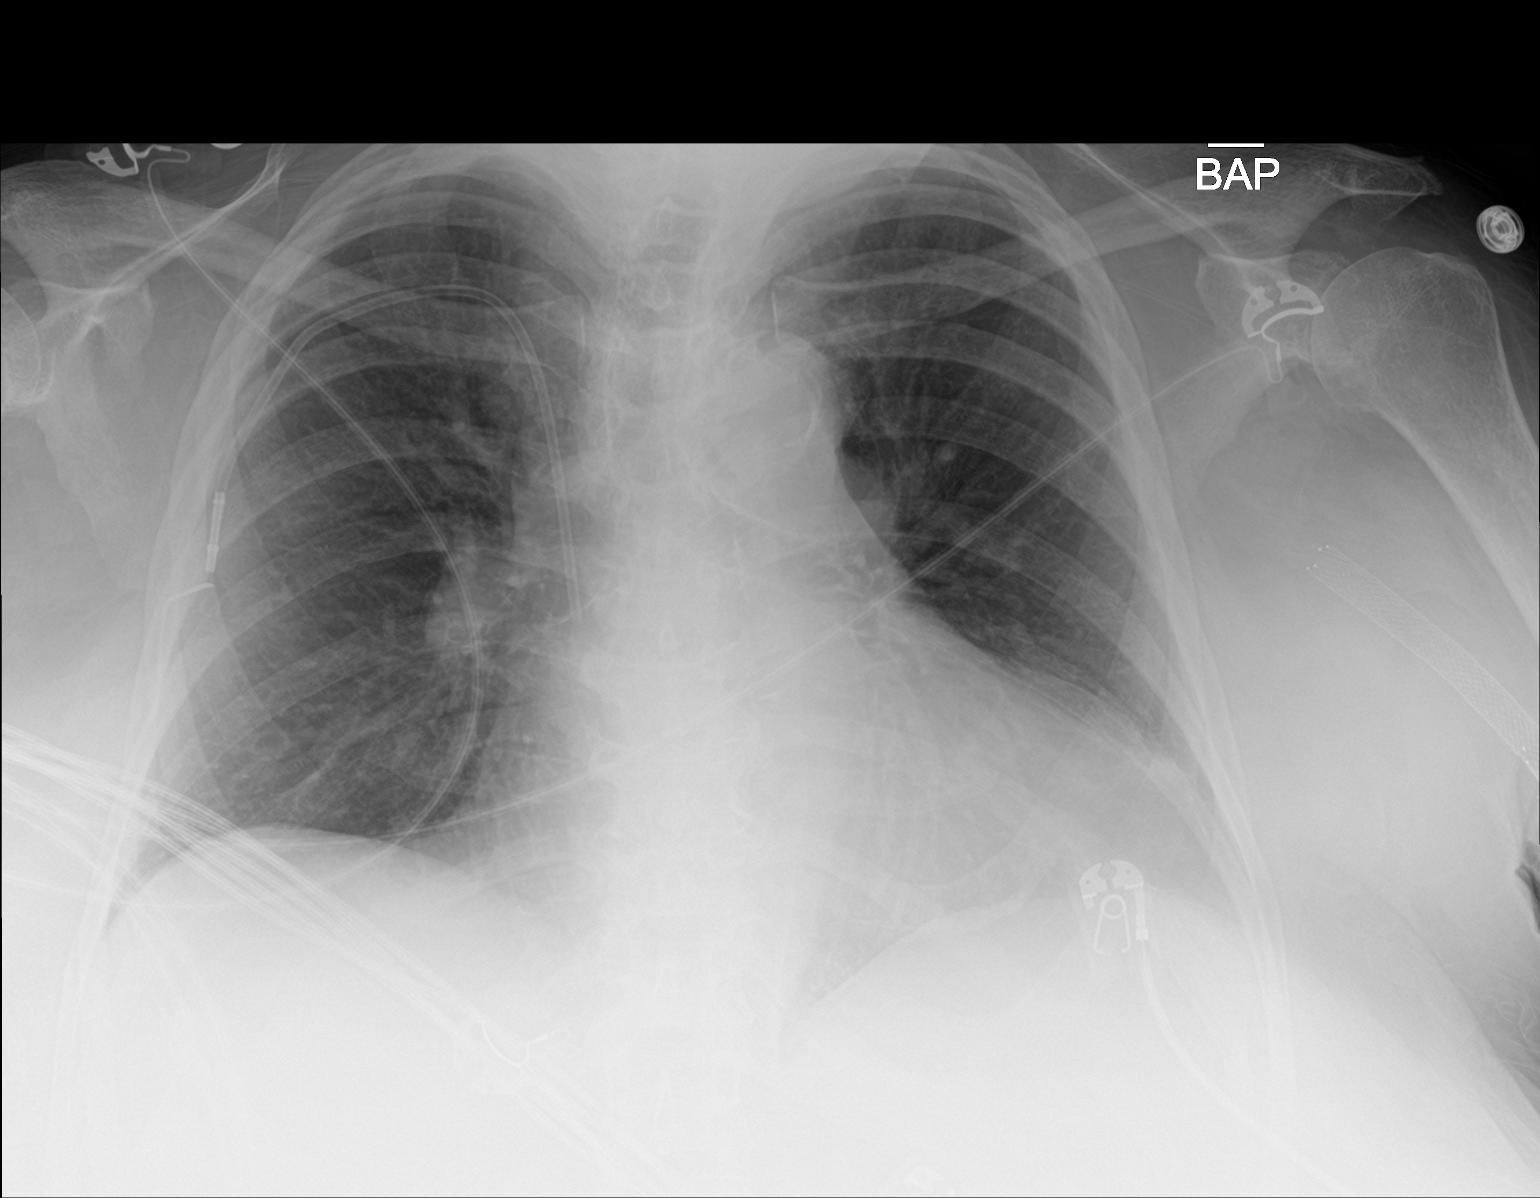

[2 of 2 positions shown; findings below may reference images not displayed]

FINDINGS: There is cardiomegaly without edema. No pneumothorax or pleural
effusion. Aortic atherosclerosis is identified. Port-A-Cath and
vascular stent in the left upper arm are in place.
IMPRESSION: Cardiomegaly without acute disease.

Atherosclerosis.

## 2018-09-08 IMAGING — XA IR US GUIDE VASC ACCESS LEFT
1 series · 13 of 24 positions shown · IV contrast (IODINE)
Comparison: none

ADDENDUM:
After initial thrombectomy in repeat angiogram of the left thigh AV
graft, critical stenosis in the venous limb of the graft was noted.
This is estimated at 80-90 percent stenosis.

Ultrasound guidance was utilized. The AV graft was noted to be
thrombosed on sonographic imaging. Sonographic documentation was
obtained.
INDICATION: End-stage renal disease and clotted left lower extremity graft.
Patient has a wound VAC in left thigh. Patient also had a covered
stent in placed in the left thigh AV graft due to a pseudoaneurysm.

[Series 300: dsa body · 13 of 92 slices shown]
[im 1/92]
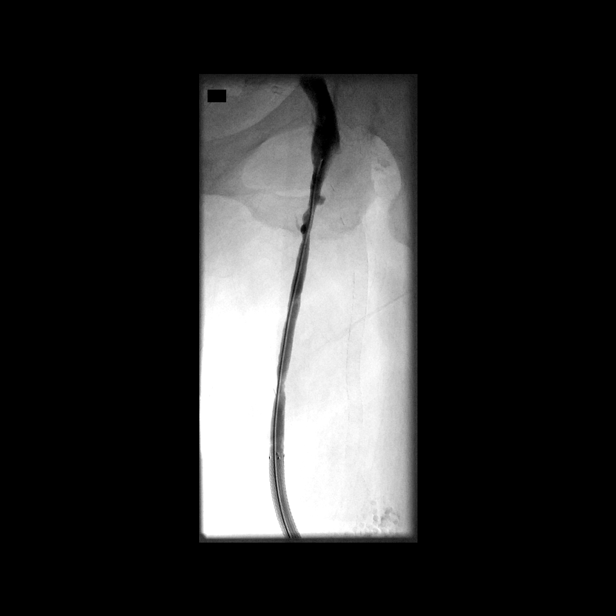
[im 8/92]
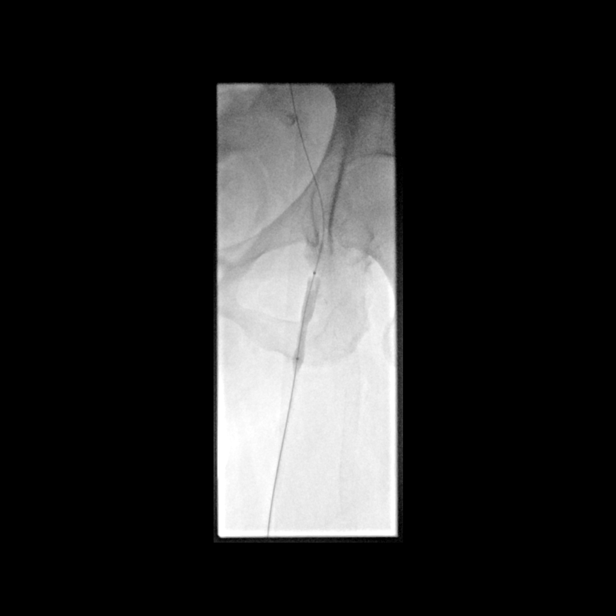
[im 16/92]
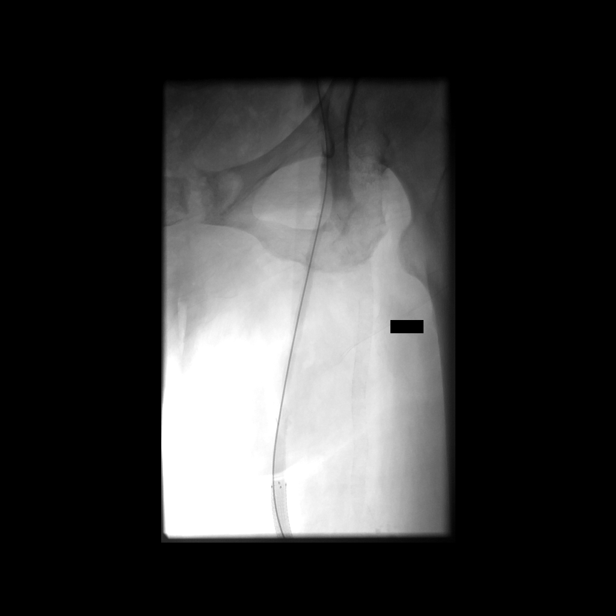
[im 24/92]
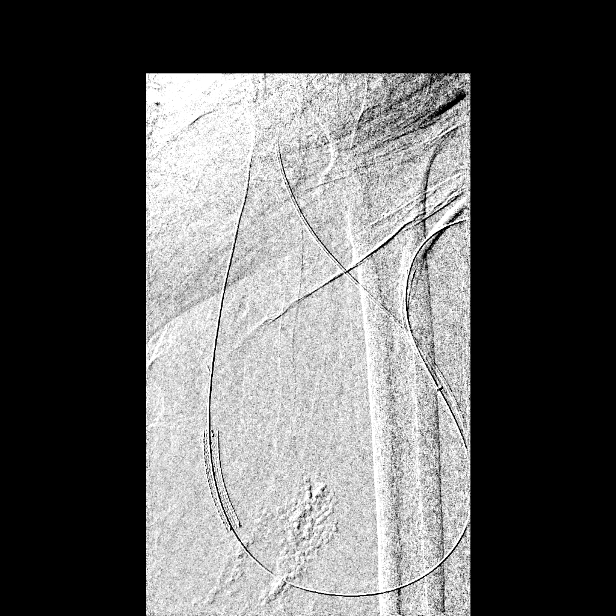
[im 32/92]
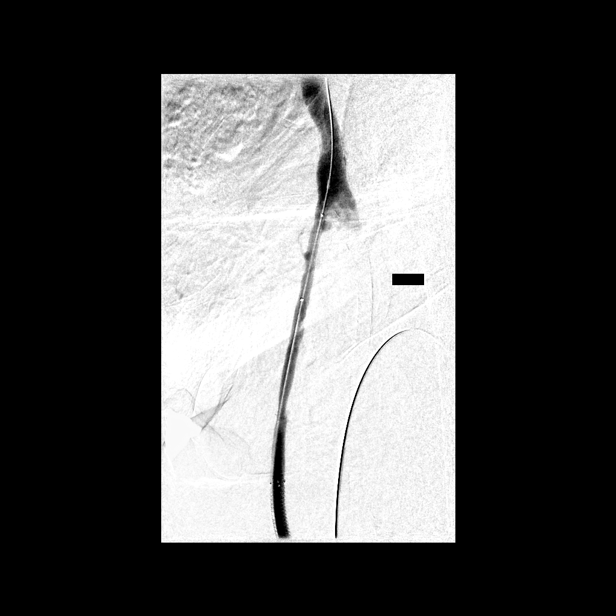
[im 40/92]
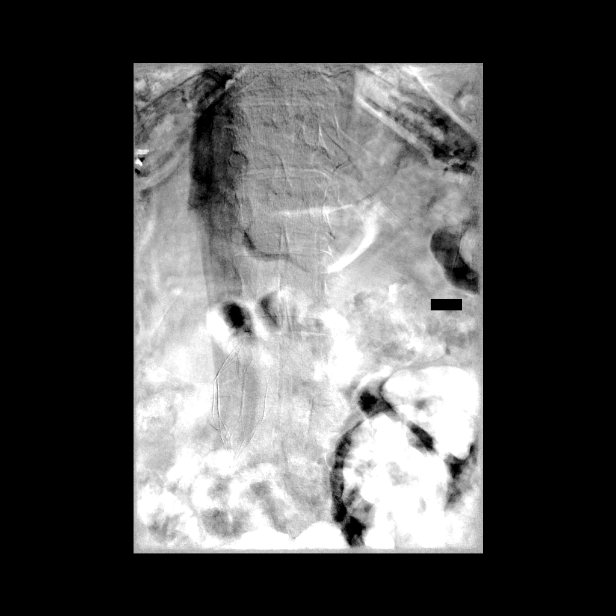
[im 48/92]
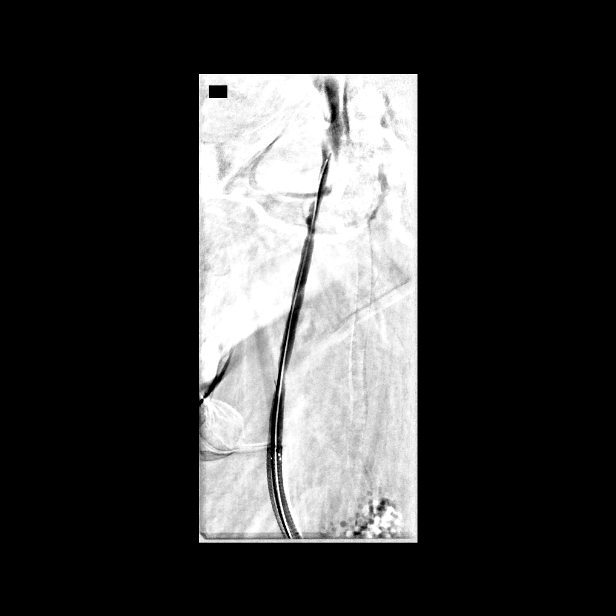
[im 52/92]
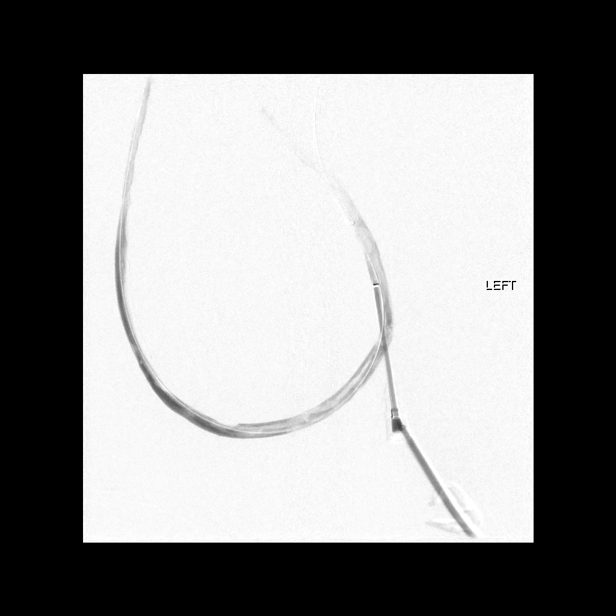
[im 60/92]
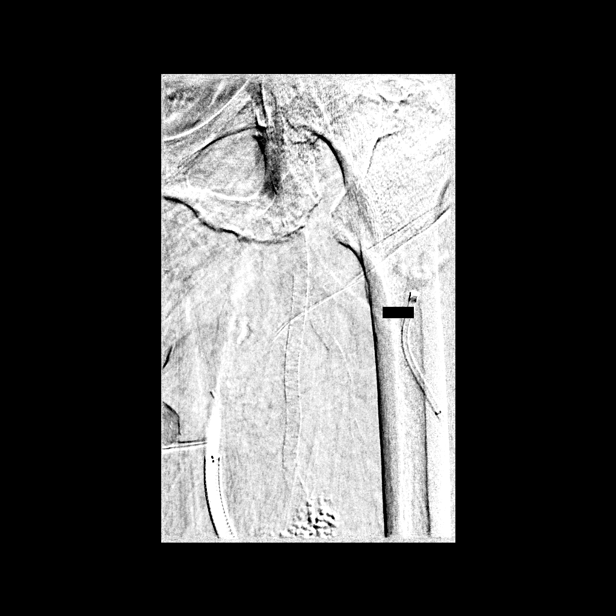
[im 68/92]
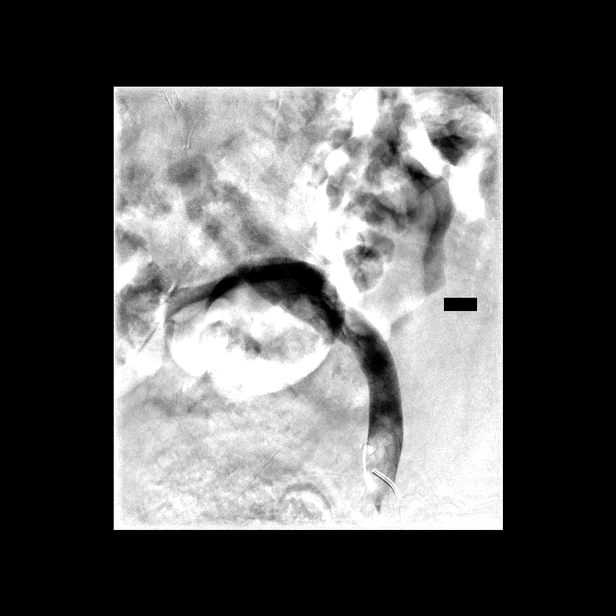
[im 76/92]
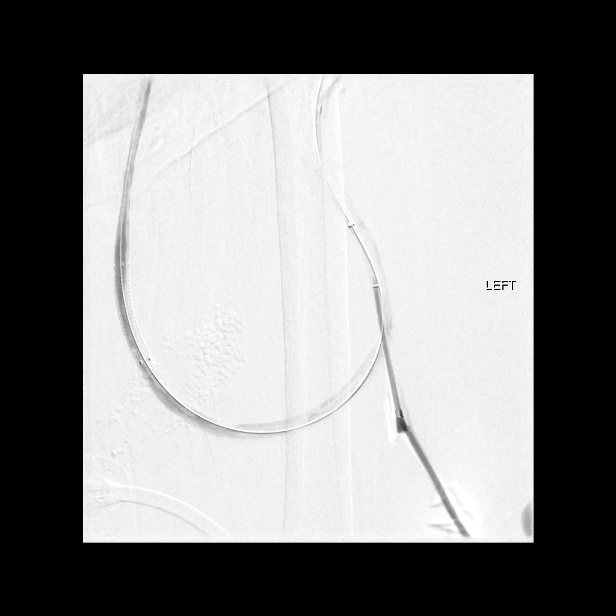
[im 84/92]
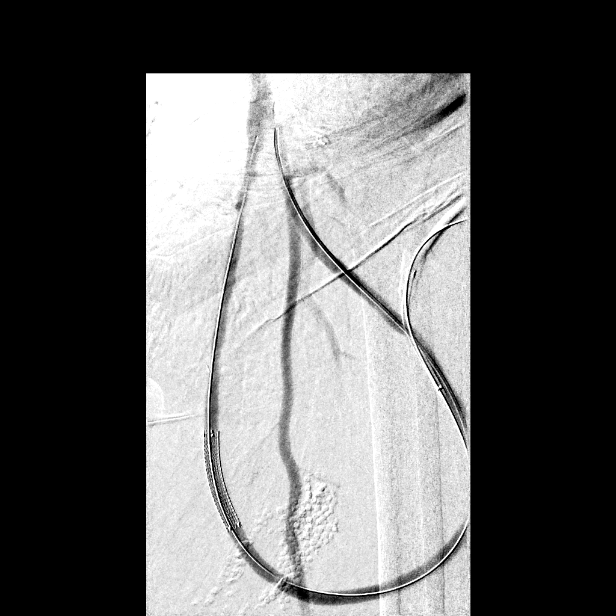
[im 92/92]
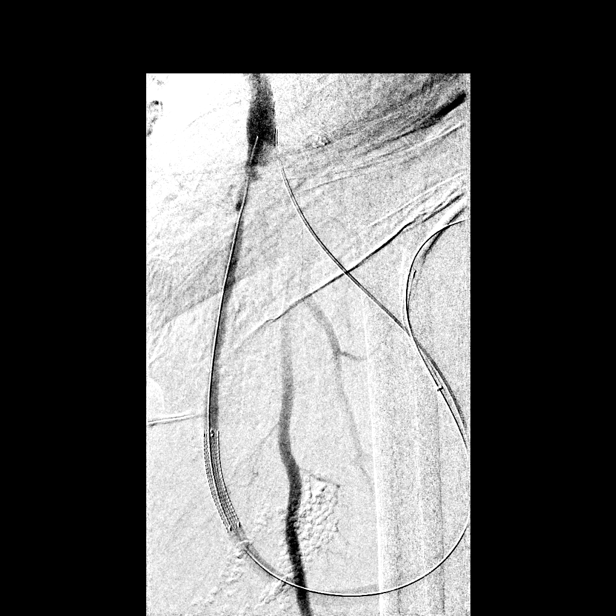

[13 of 24 positions shown; findings below may reference images not displayed]

EXAM:
DIALYSIS GRAFT DECLOT GRAFT / VENOUS PTA;
ULTRASOUND GUIDANCE FOR VASCULAR ACCESS

MEDICATIONS:
None.

ANESTHESIA/SEDATION:
TPA 2 mg, Versed 6.0 mg, Fentanyl 200 mcg

Moderate Sedation Time:  70 minutes

The patient was continuously monitored during the procedure by the
interventional radiology nurse under my direct supervision.

FLUOROSCOPY TIME:  Fluoroscopy Time: 13 minutes 24 seconds (172
mGy).

COMPLICATIONS:
None immediate.

PROCEDURE:
The procedure was explained to the patient. The risks and benefits
of the procedure were discussed and the patient's questions were
addressed. Informed consent was obtained from the patient. The left
thigh was prepped and draped in a sterile fashion. Maximal barrier
sterile technique was utilized including caps, mask, sterile gowns,
sterile gloves, sterile drape, hand hygiene and skin antiseptic. The
skin was anesthetized with 1% lidocaine. The graft was accessed
using 21 gauge needle towards the venous anastomosis with
ultrasound. The vascular access pointing towards the central veins
was upsized to a 7-French vascular sheath. A 5-French catheter was
advanced into the central venous structures and a central venogram
was performed. 2 mg of TPA was injected through the 5 French
catheter as it was pulled through the graft. Fluoroscopic images
were saved for documentation. A Bentson wire was placed centrally
and the graft was treated with the AngioJet thrombectomy device. The
venous anastomosis and medial limb were angioplastied with a 6 mm x
40 mm balloon. 21 gauge needle was directed into the graft towards
the arterial anastomosis with ultrasound guidance. Micropuncture
dilator set was placed and a 6 French vascular sheath was placed. A
Glidewire and Fogarty balloon were advanced across arterial
anastomosis. The arterial plug was pulled using the 5 French Fogarty
balloon. There was flow in the graft. The arterial plug was pulled 2
times. Follow-up angiogram demonstrated residual clot at the apex of
the graft. The graft was treated a second time with the Angiojet
thrombectomy device. Follow-up angiogram demonstrated critical
residual stenosis along the medial limb of the graft. This area was
treated again with a 6 mm x 40 mm balloon. Follow-up angiogram
images demonstrated residual stenosis along the medial limb. The
medial limb and venous anastomosis were subsequently treated with a
7 mm x 40 mm Conquest balloon. Follow-up angiogram images were
obtained. Five French catheter was advanced across the arterial
anastomosis and an angiogram was performed in order to evaluate the
anastomosis and lateral limb of the graft. The vascular sheaths were
removed with purse string sutures. No immediate complication.
FINDINGS: Occlusion of the left thigh loop graft. Patient has a covered stent
along the medial aspect of the graft. Long segment stenosis along
the medial limb of the graft required multiple angioplasties with 6
mm and 7 mm balloons. The graft is widely patent and flowing well at
the end of the procedure. Mild residual stenosis along the medial
limb of the graft at the end of the procedure.
IMPRESSION: Successful declot of the left lower extremity graft.

PLAN:
The left thigh graft remains amenable to percutaneous intervention.

## 2018-09-10 LAB — NOVEL CORONAVIRUS, NAA: SARS-CoV-2, NAA: NOT DETECTED

## 2018-09-11 ENCOUNTER — Other Ambulatory Visit: Payer: 59 | Admitting: Internal Medicine

## 2018-09-11 DIAGNOSIS — Z515 Encounter for palliative care: Secondary | ICD-10-CM

## 2018-09-12 NOTE — Progress Notes (Signed)
    [facility] Consult Note Telephone: [phone]  Fax: [phone]  PATIENT NAME: Opie Fanton DOB: Aug 26, 1940 MRN: [id number]  PRIMARY CARE PROVIDER:   [name], MD  REFERRING PROVIDER:  [name], MD [address] [place], [place] [address]  RESPONSIBLE PARTY:   [name] Lathon(daughter/gaurdian) [phone]  TELEPHONE COVID 5 SCREENING:      NOTE and PLAN:    Phoned pt's daughter/gaurdian, Belenda Cruise, prior to scheduled F/U palliative visit today.  Daughter states that patient had influenza 2 weeks ago and is recooperating from that.  The daughter, whom Ms. Quain lives with, has a fever, respiratory congestion and a cough and does not feel well.  She states that she is waiting on the results of her own COVID 19 test result.  I told that daughter that I could not visit her mother today due the positive screening and she agreed.  I encouraged her to continue communication with her PCP.  The Palliative care schedulers will follow-up with patient or daughter and reschedule visit in the future.  She thanked me for the call.   Gonzella Lex, NP

## 2018-09-13 ENCOUNTER — Other Ambulatory Visit: Payer: Self-pay

## 2018-09-13 ENCOUNTER — Encounter (HOSPITAL_COMMUNITY): Payer: 59

## 2018-10-09 ENCOUNTER — Ambulatory Visit: Payer: 59 | Admitting: Podiatry

## 2018-10-09 ENCOUNTER — Other Ambulatory Visit: Payer: Self-pay

## 2018-10-09 ENCOUNTER — Ambulatory Visit (HOSPITAL_COMMUNITY)
Admission: RE | Admit: 2018-10-09 | Discharge: 2018-10-09 | Disposition: A | Discharge status: Home / Self Care | Payer: 59 | Source: Ambulatory Visit | Attending: Internal Medicine | Admitting: Internal Medicine

## 2018-10-09 DIAGNOSIS — N186 End stage renal disease: Secondary | ICD-10-CM | POA: Diagnosis not present

## 2018-10-09 DIAGNOSIS — I872 Venous insufficiency (chronic) (peripheral): Secondary | ICD-10-CM | POA: Insufficient documentation

## 2018-10-09 DIAGNOSIS — E118 Type 2 diabetes mellitus with unspecified complications: Secondary | ICD-10-CM | POA: Diagnosis present

## 2018-10-09 DIAGNOSIS — Z992 Dependence on renal dialysis: Secondary | ICD-10-CM | POA: Insufficient documentation

## 2018-10-09 MED ORDER — SODIUM CHLORIDE 0.9% FLUSH
10.0000 mL | INTRAVENOUS | Status: AC | PRN
  Administered 2018-10-09: 10 mL

## 2018-10-09 MED ORDER — HEPARIN SOD (PORK) LOCK FLUSH 100 UNIT/ML IV SOLN
500.0000 [IU] | Freq: Once | INTRAVENOUS | Status: AC
  Administered 2018-10-09: 500 [IU] via INTRAVENOUS
  Filled 2018-10-09: qty 5

## 2018-10-09 NOTE — Progress Notes (Signed)
PATIENT CARE CENTER NOTE   Provider: [name], MD   Procedure: Port-a-cath flush   Note: Patient's PAC assessed and flushed with Heparin and 0.9% Sodium Chloride. Sterile procedure followed. PAC deassessed and covered with band-aid. Patient tolerated well. Alert, oriented and ambulatory to wheelchair at discharge.

## 2018-10-11 ENCOUNTER — Encounter (HOSPITAL_COMMUNITY): Payer: 59

## 2018-10-16 ENCOUNTER — Encounter (HOSPITAL_COMMUNITY): Payer: 59

## 2018-10-23 ENCOUNTER — Ambulatory Visit: Payer: 59 | Admitting: Podiatry

## 2018-11-08 ENCOUNTER — Encounter (HOSPITAL_COMMUNITY): Payer: 59

## 2018-11-15 ENCOUNTER — Ambulatory Visit (HOSPITAL_COMMUNITY)
Admission: RE | Admit: 2018-11-15 | Discharge: 2018-11-15 | Disposition: A | Discharge status: Home / Self Care | Payer: 59 | Source: Ambulatory Visit | Attending: Internal Medicine | Admitting: Internal Medicine

## 2018-11-15 ENCOUNTER — Other Ambulatory Visit: Payer: Self-pay

## 2018-11-15 DIAGNOSIS — N186 End stage renal disease: Secondary | ICD-10-CM | POA: Insufficient documentation

## 2018-11-15 DIAGNOSIS — E1122 Type 2 diabetes mellitus with diabetic chronic kidney disease: Secondary | ICD-10-CM | POA: Diagnosis not present

## 2018-11-15 DIAGNOSIS — E118 Type 2 diabetes mellitus with unspecified complications: Secondary | ICD-10-CM | POA: Diagnosis present

## 2018-11-15 MED ORDER — HEPARIN SOD (PORK) LOCK FLUSH 100 UNIT/ML IV SOLN
500.0000 [IU] | INTRAVENOUS | Status: AC | PRN
  Administered 2018-11-15: 500 [IU]

## 2018-11-15 MED ORDER — SODIUM CHLORIDE 0.9% FLUSH: 10.0000 mL | INTRAVENOUS | Status: DC | PRN

## 2018-11-15 NOTE — Progress Notes (Signed)
PATIENT CARE CENTER NOTE   Provider: [name], MD   Procedure: Port-a-cath flush   Note: Patient's PAC assessed and flushed with Heparin and 0.9% Sodium Chloride. Sterile procedure followed. PAC deassessed and covered with band-aid. Patient tolerated well. Alert, oriented and ambulatory to wheelchair at discharge.

## 2018-11-15 NOTE — Discharge Instructions (Signed)
Port-a-cath flushed with Heparin and 0.9% Sodium Chloride 

## 2018-11-17 ENCOUNTER — Ambulatory Visit: Payer: 59 | Admitting: Podiatry

## 2018-11-20 ENCOUNTER — Ambulatory Visit (INDEPENDENT_AMBULATORY_CARE_PROVIDER_SITE_OTHER): Payer: 59 | Admitting: Podiatry

## 2018-11-20 ENCOUNTER — Encounter: Payer: Self-pay | Admitting: Podiatry

## 2018-11-20 ENCOUNTER — Other Ambulatory Visit: Payer: Self-pay

## 2018-11-20 VITALS — Temp 98.4°F

## 2018-11-20 DIAGNOSIS — D689 Coagulation defect, unspecified: Secondary | ICD-10-CM | POA: Diagnosis not present

## 2018-11-20 DIAGNOSIS — M79675 Pain in left toe(s): Secondary | ICD-10-CM | POA: Diagnosis not present

## 2018-11-20 DIAGNOSIS — M79674 Pain in right toe(s): Secondary | ICD-10-CM

## 2018-11-20 DIAGNOSIS — B351 Tinea unguium: Secondary | ICD-10-CM | POA: Diagnosis not present

## 2018-11-20 DIAGNOSIS — E1142 Type 2 diabetes mellitus with diabetic polyneuropathy: Secondary | ICD-10-CM

## 2018-11-20 NOTE — Progress Notes (Addendum)
Complaint:  Visit Type: Patient returns to my office for continued preventative foot care services. Complaint: Patient states" my nails have grown long and thick and become painful to walk and wear shoes" Patient has been diagnosed with DM with no foot complications. The patient presents for preventative foot care services. No changes to ROS.  Patient presents to the office with her son. .Patient is taking coumadin.  Podiatric Exam: Vascular: dorsalis pedis and posterior tibial pulses are weakly  palpable bilateral. Capillary return is immediate. Temperature gradient is WNL. Skin turgor WNL  Sensorium: Normal Semmes Weinstein monofilament test. Normal tactile sensation bilaterally. Nail Exam: Pt has thick disfigured discolored nails with subungual debris noted bilateral entire nail hallux through fifth toenails Ulcer Exam: There is no evidence of ulcer or pre-ulcerative changes or infection. Orthopedic Exam: Muscle tone and strength are WNL. No limitations in general ROM. No crepitus or effusions noted. Foot type and digits show no abnormalities. Bony prominences are unremarkable. Skin: No Porokeratosis. No infection or ulcers  Diagnosis:  Onychomycosis, , Pain in right toe, pain in left toes  Treatment & Plan Procedures and Treatment: Consent by patient was obtained for treatment procedures.   Debridement of mycotic and hypertrophic toenails, 1 through 5 bilateral and clearing of subungual debris. No ulceration, no infection noted.  Return Visit-Office Procedure: Patient instructed to return to the office for a follow up visit 3 months for continued evaluation and treatment.  Padding dispensed for bone spur fifth toe right foot.    Gardiner Barefoot DPM

## 2018-12-06 ENCOUNTER — Telehealth: Payer: Self-pay

## 2018-12-06 NOTE — Telephone Encounter (Signed)
Dr. Daphene Jaeger called to check on the status of patient referral. Sharl Ma can you check on the status. Thank you!

## 2018-12-10 NOTE — Telephone Encounter (Signed)
Will await response from [facility]

## 2018-12-11 ENCOUNTER — Other Ambulatory Visit: Payer: Self-pay

## 2018-12-11 ENCOUNTER — Ambulatory Visit (INDEPENDENT_AMBULATORY_CARE_PROVIDER_SITE_OTHER): Payer: 59 | Admitting: Emergency Medicine

## 2018-12-11 ENCOUNTER — Encounter: Payer: Self-pay | Admitting: Emergency Medicine

## 2018-12-11 ENCOUNTER — Ambulatory Visit (INDEPENDENT_AMBULATORY_CARE_PROVIDER_SITE_OTHER): Payer: 59

## 2018-12-11 VITALS — BP 110/68 | HR 68 | Ht 62.0 in | Wt 212.4 lb

## 2018-12-11 DIAGNOSIS — R0600 Dyspnea, unspecified: Secondary | ICD-10-CM | POA: Diagnosis not present

## 2018-12-11 DIAGNOSIS — R05 Cough: Secondary | ICD-10-CM

## 2018-12-11 DIAGNOSIS — R059 Cough, unspecified: Secondary | ICD-10-CM

## 2018-12-11 MED ORDER — ALBUTEROL SULFATE (2.5 MG/3ML) 0.083% IN NEBU: 2.5000 mg | INHALATION_SOLUTION | RESPIRATORY_TRACT | 5 refills | Status: DC | PRN

## 2018-12-11 NOTE — Assessment & Plan Note (Addendum)
Chronic cough that appears to be longstanding based on a review of the chart notes but has been significantly worse since March per the patient's daughter.  She has been tried on methylprednisolone, hydrocodone cough syrup, Tessalon.  Also started empirically on Symbicort, albuterol.  She is not on an ACE inhibitor.  She denies any breakthrough GERD symptoms on PPI twice daily.  She does have some nasal congestion and is not on allergy regimen. 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   We will perform pulmonary function testing. Please stop Symbicort for now.  We may decide to restart it depending on how your testing looks You can continue to use albuterol 2 puffs up to every 4 hours if needed for shortness of breath or spells of coughing We will add albuterol nebulizer treatments.  You can use these up to every 4 hours if you need them for shortness of breath or spells of coughing. Chest x-ray today Please continue your Protonix 40 mg twice a day.  Try to take this medication 1 hour around food. Please start taking loratadine 10 mg (Claritin) once daily until next visit. Follow with Dr Lamonte Sakai next available with PFT to review the results and your symptoms

## 2018-12-11 NOTE — Patient Instructions (Addendum)
We will perform pulmonary function testing. Please stop Symbicort for now.  We may decide to restart it depending on how your testing looks You can continue to use albuterol 2 puffs up to every 4 hours if needed for shortness of breath or spells of coughing We will add albuterol nebulizer treatments.  You can use these up to every 4 hours if you need them for shortness of breath or spells of coughing. Chest x-ray today Please continue your Protonix 40 mg twice a day.  Try to take this medication 1 hour around food. Please start taking loratadine 10 mg (Claritin) once daily until next visit. Follow with Dr Lamonte Sakai next available with PFT to review the results and your symptoms.

## 2018-12-11 NOTE — Progress Notes (Addendum)
Subjective:    Patient ID: Ashlee Miles, female    DOB: 04/09/1941, 78 y.o.   MRN: [id number]  HPI 78 year old woman, never smoker, with a history of end-stage renal disease on hemodialysis, diabetes, hypertension, protein C deficiency w Hx DVT.  She is referred today for evaluation of chronic cough. She is treated w PPI bid for GERD. Pt denies any significant GERD currently. She has abdominal and lower chest pain that she ascribes to the cough. Symbicort is on her med list, looks like it was started on 10/28/2018? She states that she has allergy sx, can be associated with the cough, nasal drainage. She has evidence for obstruction, COPD clinically. Will be reasonable to continue albuterol and confirm by spirometry.   Pt unable to give an accurate history but information from her daughter is available.  She states that patient began to have cough in March, typically nonproductive.  Can be paroxysmal and lead to gasping.  She is been treated with hydrocodone cough syrup, more recently oxycodone for both cough and pain.  About a week ago she was empirically started on Symbicort and albuterol to see if she would get benefit. She isn't sure whether it is helping her. Also received methyl-pred taper.    Review of Systems  Constitutional: Negative.  Negative for fever and unexpected weight change.  HENT: Positive for congestion. Negative for dental problem, ear pain, nosebleeds, postnasal drip, rhinorrhea, sinus pressure, sneezing, sore throat and trouble swallowing.   Eyes: Negative for redness and itching.  Respiratory: Positive for cough, shortness of breath and wheezing. Negative for chest tightness.   Cardiovascular: Negative.  Negative for palpitations and leg swelling.  Gastrointestinal: Negative.  Negative for nausea and vomiting.  Genitourinary: Negative.  Negative for dysuria.  Musculoskeletal: Negative.  Negative for joint swelling.  Skin: Negative for rash.  Allergic/Immunologic: Positive  for environmental allergies. Negative for food allergies and immunocompromised state.  Neurological: Negative.  Negative for headaches.  Hematological: Does not bruise/bleed easily.  Psychiatric/Behavioral: Negative.  Negative for dysphoric mood. The patient is not nervous/anxious.     Past Medical History:  Diagnosis Date   CHF (congestive heart failure) (HCC)    Diabetes mellitus without complication (HCC)    ESRD (end stage renal disease) on dialysis ([place])    History of pituitary adenoma    Hypertension    Protein C deficiency ([place])      Family History  Problem Relation Age of Onset   Breast cancer Mother    Diabetes Sister    Diabetes Brother    CAD Other    Stroke Neg Hx      Social History   Socioeconomic History   Marital status: Married    Spouse name: Not on file   Number of children: Not on file   Years of education: Not on file   Highest education level: Not on file  Occupational History   Occupation: retired  Scientist, product/process development strain: Not on file   Food insecurity    Worry: Not on file    Inability: Not on Lexicographer needs    Medical: Not on file    Non-medical: Not on file  Tobacco Use   Smoking status: Never Smoker   Smokeless tobacco: Never Used  Substance and Sexual Activity   Alcohol use: No   Drug use: No   Sexual activity: Not on file  Lifestyle   Physical activity    Days per week: 0 days  Minutes per session: Not on file   Stress: To some extent  Relationships   Social connections    Talks on phone: Patient refused    Gets together: Patient refused    Attends religious service: Patient refused    Active member of club or organization: Patient refused    Attends meetings of clubs or organizations: Patient refused    Relationship status: Patient refused   Intimate partner violence    Fear of current or ex partner: Not on file    Emotionally abused: Not on file    Physically  abused: Not on file    Forced sexual activity: Not on file  Other Topics Concern   Not on file  Social History Narrative   Not on file     No Known Allergies   Outpatient Medications Prior to Visit  Medication Sig Dispense Refill   albuterol (VENTOLIN HFA) 108 (90 Base) MCG/ACT inhaler albuterol sulfate HFA 90 mcg/actuation aerosol inhaler     Amino Acids-Protein Hydrolys (FEEDING SUPPLEMENT, PRO-STAT SUGAR FREE 64,) LIQD Take 30 mLs by mouth 2 (two) times daily. 900 mL 0   benzonatate (TESSALON PERLES) 100 MG capsule Tessalon Perles 100 mg capsule  Take 1 capsule 3 times a day by oral route for 7 days.     budesonide-formoterol (SYMBICORT) 160-4.5 MCG/ACT inhaler Symbicort 160 mcg-4.5 mcg/actuation HFA aerosol inhaler     calcitRIOL (ROCALTROL) 0.5 MCG capsule Take 0.5 mcg by mouth daily.     camphor-menthol (SARNA) lotion camphor-menthol 0.5 %-0.5 % lotion  Sarna lotion, q 8hrs prn for itching     carbamide peroxide (DEBROX) 6.5 % OTIC solution Debrox 6.5 % ear drops  INSTILL 5 DROPS INTO AFFECTED EAR(S) BY OTIC ROUTE 2 TIMES PER DAY     carvedilol (COREG) 3.125 MG tablet      clobetasol cream (TEMOVATE) 0.05 % APPLY THIN COAT TO AFFECTED AREA TWICE A DAY     collagenase (SANTYL) ointment Apply topically 2 (two) times daily. Apply to L thigh wound BID. Wet-to-dry dressing on top Santyl BID 15 g 0   docusate sodium (COLACE) 100 MG capsule Take 1 capsule (100 mg total) by mouth 2 (two) times daily. 10 capsule 0   famotidine (PEPCID) 20 MG tablet Take 1 tablet (20 mg total) daily by mouth. 30 tablet 0   gabapentin (NEURONTIN) 100 MG capsule Take 1 capsule (100 mg total) by mouth at bedtime. 30 capsule 0   HYDROcodone-acetaminophen (NORCO) 7.5-325 MG tablet hydrocodone 7.5 mg-acetaminophen 325 mg tablet     insulin aspart protamine- aspart (NOVOLOG MIX 70/30) (70-30) 100 UNIT/ML injection Inject 30-40 Units into the skin See admin instructions. 40 units in the morning and  30 units in the evening or Sliding Scale     Insulin Lispro Prot & Lispro (HUMALOG MIX 75/25 KWIKPEN) (75-25) 100 UNIT/ML Kwikpen Humalog Mix 75-25 KwikPen U-100 insulin 100 unit/mL subcutaneous pen     lidocaine-prilocaine (EMLA) cream APPLY TOPICALLY TO LEFT LEG GRAFT 1 HOUR BEFORE DIALYSIS AND COVER WITH PLASTIC WRAP     midodrine (PROAMATINE) 10 MG tablet Take 1 tablet (10 mg total) by mouth every Monday, Wednesday, and Friday with hemodialysis. 30 tablet 0   multivitamin (RENA-VIT) TABS tablet Take 1 tablet by mouth at bedtime. 30 tablet 0   nystatin-triamcinolone (MYCOLOG II) cream nystatin-triamcinolone 100,000 unit/g-0.1 % topical cream  Apply 1 application topically every 12 (twelve) hours as needed.     pantoprazole (PROTONIX) 40 MG tablet Take 1 tablet (40 mg  total) 2 (two) times daily before a meal by mouth. 60 tablet 0   PARoxetine (PAXIL) 10 MG tablet Take 1 tablet (10 mg total) daily by mouth. 30 tablet 0   polyethylene glycol (MIRALAX / GLYCOLAX) packet Take 17 g daily by mouth. 14 each 0   sevelamer carbonate (RENVELA) 800 MG tablet Take 800 mg by mouth 3 (three) times daily with meals.     simvastatin (ZOCOR) 40 MG tablet Take 40 mg by mouth daily at 6 PM.     sucralfate (CARAFATE) 1 GM/10ML suspension Take 10 mLs (1 g total) 4 (four) times daily -  with meals and at bedtime by mouth. 420 mL 0   thiamine (VITAMIN B-1) 100 MG tablet Take 1 tablet (100 mg total) by mouth daily. 30 tablet 0   triamcinolone cream (KENALOG) 0.1 % Apply 1 application topically every 12 (twelve) hours as needed.      warfarin (COUMADIN) 5 MG tablet Take 5 mg by mouth daily at 6 PM.     amLODipine (NORVASC) 10 MG tablet amlodipine 10 mg tablet     azithromycin (ZITHROMAX) 250 MG tablet azithromycin 250 mg tablet     cinacalcet (SENSIPAR) 30 MG tablet Take 30 mg by mouth daily.     cyclobenzaprine (FLEXERIL) 5 MG tablet cyclobenzaprine 5 mg tablet     doxycycline (VIBRAMYCIN) 100 MG  capsule Take 100 mg by mouth 2 (two) times daily.     enoxaparin (LOVENOX) 100 MG/ML injection enoxaparin 100 mg/mL subcutaneous syringe  Inject 100 mL every day by subcutaneous route.     furosemide (LASIX) 80 MG tablet furosemide 80 mg tablet     hydrALAZINE (APRESOLINE) 50 MG tablet hydralazine 50 mg tablet     hydrOXYzine (ATARAX/VISTARIL) 25 MG tablet hydroxyzine HCl 25 mg tablet  Take 1 tablet every 8 hours by oral route as needed.     ipratropium-albuterol (DUONEB) 0.5-2.5 (3) MG/3ML SOLN ipratropium 0.5 mg-albuterol 3 mg (2.5 mg base)/3 mL nebulization soln     linaclotide (LINZESS) 145 MCG CAPS capsule Take 145 mcg by mouth daily as needed.      losartan (COZAAR) 25 MG tablet losartan 25 mg tablet     meloxicam (MOBIC) 7.5 MG tablet Take 7.5 mg by mouth daily as needed.     methocarbamol (ROBAXIN) 500 MG tablet methocarbamol 500 mg tablet  Take 1 tablet every 8 hours by oral route as needed.     metolazone (ZAROXOLYN) 5 MG tablet metolazone 5 mg tablet     oseltamivir (TAMIFLU) 30 MG capsule Take 30 mg by mouth daily.     potassium chloride SA (K-DUR) 20 MEQ tablet potassium chloride ER 20 mEq tablet,extended release(part/cryst)     prednisoLONE acetate (PRED FORTE) 1 % ophthalmic suspension prednisolone acetate 1 % eye drops,suspension     ranitidine (ZANTAC) 150 MG tablet ranitidine 150 mg tablet  Take 1 tablet twice a day by oral route.     sevelamer (RENAGEL) 800 MG tablet sevelamer HCl 800 mg tablet  1 tab po ted with meals     traMADol (ULTRAM) 50 MG tablet tramadol 50 mg tablet     No facility-administered medications prior to visit.         Objective:   Physical Exam Vitals:   12/11/18 1342  BP: 110/68  Pulse: 68  SpO2: 97%  Weight: 212 lb 6.4 oz (96.3 kg)  Height: 5\' 2"  (1.575 m)   Gen: Pleasant, elderly woman in wheelchair, in no distress,  normal affect  ENT: No lesions,  mouth clear, post pharynx crowded,  oropharynx clear, no postnasal  drip  Neck: No JVD, no stridor  Lungs: No use of accessory muscles, no crackles or wheezing on normal respiration, no wheeze on forced expiration  Cardiovascular: RRR, heart sounds normal, no murmur or gallops, trace peripheral edema  Musculoskeletal: No deformities, no cyanosis or clubbing  Neuro: alert, awake, non focal  Skin: Warm, no lesions or rash     Assessment & Plan:  Cough in adult Chronic cough that appears to be longstanding based on a review of the chart notes but has been significantly worse since March per the patient's daughter.  She has been tried on methylprednisolone, hydrocodone cough syrup, Tessalon.  Also started empirically on Symbicort, albuterol.  She is not on an ACE inhibitor.  She denies any breakthrough GERD symptoms on PPI twice daily.  She does have some nasal congestion and is not on allergy regimen. 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   We will perform pulmonary function testing. Please stop Symbicort for now.  We may decide to restart it depending on how your testing looks You can continue to use albuterol 2 puffs up to every 4 hours if needed for shortness of breath or spells of coughing We will add albuterol nebulizer treatments.  You can use these up to every 4 hours if you need them for shortness of breath or spells of coughing. Chest x-ray today Please continue your Protonix 40 mg twice a day.  Try to take this medication 1 hour around food. Please start taking loratadine 10 mg (Claritin) once daily until next visit. Follow with Dr Lamonte Sakai next available with PFT to review the results and your symptoms  Baltazar Apo, MD, PhD 12/14/2018, 12:48 PM [facility] [phone] or if no answer [phone]

## 2018-12-12 ENCOUNTER — Telehealth: Payer: Self-pay | Admitting: Emergency Medicine

## 2018-12-12 DIAGNOSIS — R062 Wheezing: Secondary | ICD-10-CM

## 2018-12-12 NOTE — Telephone Encounter (Signed)
Order that was placed 6/23 was for nebulizer machine.  Dr. Lamonte Sakai, please advise if there are any other diagnoses other than dyspnea with exertion that are considered qualifying diagnosis for the nebulizer machine (ie: asthma, chronic bronchitis, emphysema, copd, pna, etc)? Thank you!

## 2018-12-12 NOTE — Telephone Encounter (Signed)
Catron, Valentina Lucks, Allyne Gee, [place]; [place], Jeanie Cooks, Jennifer L        She doesn't have a qualifying dx ie: asthma, chronic bronchitic, emphysema, copd, pneumonia. Please review and see if you can see a qualifying dx and let us know. Thank you   Previous Messages  ----- Message -----  From: Harland German  Sent: 12/11/2018  4:41 PM EDT  To: Darlina Guys, Elon Alas, *  Subject: neb machine                    1940/09/26  Order placed by Dr Lamonte Sakai please advise. Thanks Chantel

## 2018-12-13 ENCOUNTER — Other Ambulatory Visit: Payer: Self-pay

## 2018-12-13 ENCOUNTER — Ambulatory Visit (HOSPITAL_COMMUNITY)
Admission: RE | Admit: 2018-12-13 | Discharge: 2018-12-13 | Disposition: A | Discharge status: Home / Self Care | Payer: 59 | Source: Ambulatory Visit | Attending: Internal Medicine | Admitting: Internal Medicine

## 2018-12-13 DIAGNOSIS — E1122 Type 2 diabetes mellitus with diabetic chronic kidney disease: Secondary | ICD-10-CM | POA: Insufficient documentation

## 2018-12-13 DIAGNOSIS — N186 End stage renal disease: Secondary | ICD-10-CM | POA: Insufficient documentation

## 2018-12-13 DIAGNOSIS — Z452 Encounter for adjustment and management of vascular access device: Secondary | ICD-10-CM | POA: Insufficient documentation

## 2018-12-13 MED ORDER — ALBUTEROL SULFATE (2.5 MG/3ML) 0.083% IN NEBU: 2.5000 mg | INHALATION_SOLUTION | RESPIRATORY_TRACT | 5 refills | Status: DC | PRN

## 2018-12-13 MED ORDER — SODIUM CHLORIDE 0.9% FLUSH
10.0000 mL | INTRAVENOUS | Status: AC | PRN
  Administered 2018-12-13: 10 mL

## 2018-12-13 MED ORDER — HEPARIN SOD (PORK) LOCK FLUSH 100 UNIT/ML IV SOLN
500.0000 [IU] | INTRAVENOUS | Status: AC | PRN
  Administered 2018-12-13: 500 [IU]
  Filled 2018-12-13: qty 5

## 2018-12-13 NOTE — Progress Notes (Signed)
PATIENT CARE CENTER NOTE   Provider:Philip Asenso, MD   Procedure:Port-a-cath flush   Note:Patient's PAC assessed and flushed with Heparin and 0.9% Sodium Chloride. Sterile procedure followed. PAC deassessed andsitecovered with band-aid. Patient tolerated well. Alert, oriented and ambulatory to wheelchair at discharge. 

## 2018-12-13 NOTE — Telephone Encounter (Signed)
Ashlee Miles daughter is returning phone call.  Ashlee Miles phone number is [phone] h or [phone] c.

## 2018-12-13 NOTE — Telephone Encounter (Signed)
Pt's daughter returning call and can be reached  @ [phone].Hillery Hunter

## 2018-12-13 NOTE — Telephone Encounter (Signed)
RB is in clinic today.   Wyn Quaker, FNP

## 2018-12-13 NOTE — Telephone Encounter (Signed)
Use dx of wheezing

## 2018-12-13 NOTE — Telephone Encounter (Signed)
Sent message back to Ashlee Miles letting her know diagnosis of COPD. Will wait and see if we need another order or if she can take the verbal.  Attempted to call pt's daughter Ashlee Miles to let her know that we were still working on getting the nebulizer machine taken care of for pt but unable to reach. Left detailed message for Ashlee Miles letting her know this information. Will await a response from Ashlee Miles from the community message to see if she needs another order or if the verbal diagnosis of COPD I gave her would be fine.will await a return message from Ashlee Miles.

## 2018-12-13 NOTE — Telephone Encounter (Signed)
Ashlee Miles, please advise of a diagnosis as RB has not yet responded and it not in clinic. If unable to advise will re-route message to [place]. Thanks.

## 2018-12-13 NOTE — Telephone Encounter (Signed)
I have sent Ashlee Miles with [place] a community message letting her know that [place] said we could use diagnosis of wheezing. Will await a response from her if this diagnosis will be okay or if another diagnosis needs to be used for the nebulizer machine order. Also asked her if the diagnosis does work if she needed [place] to place another order or if the verbal would be fine. Will await a response.

## 2018-12-13 NOTE — Discharge Instructions (Signed)
Port-a-cath flushed with Heparin and 0.9% Sodium Chloride 

## 2018-12-13 NOTE — Telephone Encounter (Signed)
Catron, [place], Waldemar Dickens, CMA; [place]ron, Stanford Breed, Melissa        Wheezing is not a valid diagnosis. That is considered a symptom by insurance. it would have to be something like copd, emphysema, chronic bronchitis, asthma, pneumonia. I am calling and offering private pay for nebulizer. it is $100.00    Dr. Lamonte Sakai, please see the above community message I received in regards to pt's nebulizer machine diagnoses that are considered qualifying diagnoses and advise on this for pt. Thank you!

## 2018-12-13 NOTE — Telephone Encounter (Signed)
Ashlee Miles daughter states [facility] needs diagnosis for Nebulizer machine.  Ashlee Miles phone number is [phone] h or [phone] c.  May leave detailed message on voicemail.

## 2018-12-13 NOTE — Telephone Encounter (Signed)
Another community message received from [place] with [employer]:  Catron, [place], Waldemar Dickens, CMA; [place]ron, Stanford Breed, Melissa        It will need to be updated in her office visit notes to show she has copd. It must be documented in her actual medical records. Let me know when that is completed. I have advised the daughter of the private pay price.    This was in regards to [place] using the diagnosis of COPD for the nebulizer machine. Dr. Lamonte Sakai, can you please do an addendum in the office visit with this diagnosis so that way [facility] has documentation of it in medical records. Thank you!

## 2018-12-13 NOTE — Telephone Encounter (Signed)
Call returned to patient daughter (DPR), made aware we have sent [place] an updated diagnosis code and we are waiting to hear back. Voiced understanding. Will await f/u from [facility].

## 2018-12-13 NOTE — Telephone Encounter (Signed)
Change the dx to COPD

## 2018-12-14 ENCOUNTER — Telehealth: Payer: Self-pay | Admitting: Emergency Medicine

## 2018-12-14 NOTE — Telephone Encounter (Signed)
Addendum done

## 2018-12-14 NOTE — Telephone Encounter (Signed)
Staff message sent to [place]. However, in patient's chart, it appears that [facility] does not take her insurance and the order was sent to [facility] instead.

## 2018-12-14 NOTE — Telephone Encounter (Signed)
Called Raven back but no one answered. Reviewed patient's chart to see if we could use the COPD or asthma diagnosis but I did not see any mention of these in her note.   RB, please advise if we can use the diagnosis code for either COPD or asthma for her nebulizer solution so that insurance will pay for it. Thanks!

## 2018-12-18 ENCOUNTER — Telehealth: Payer: Self-pay

## 2018-12-18 NOTE — Telephone Encounter (Signed)
Received phone call from patient's daughter, [name],  requesting a visit to be scheduled with Enid Derry NP. [name] shared that her mom is not dong well. Visit scheduled for 12/25/2018. Covid screen negative. Both patient and daughter tested neg for covid

## 2018-12-19 ENCOUNTER — Telehealth: Payer: Self-pay | Admitting: Emergency Medicine

## 2018-12-19 MED ORDER — ALBUTEROL SULFATE (2.5 MG/3ML) 0.083% IN NEBU: 2.5000 mg | INHALATION_SOLUTION | RESPIRATORY_TRACT | 11 refills | Status: DC | PRN

## 2018-12-19 MED ORDER — ALBUTEROL SULFATE (2.5 MG/3ML) 0.083% IN NEBU: INHALATION_SOLUTION | RESPIRATORY_TRACT | 11 refills | Status: DC

## 2018-12-19 NOTE — Telephone Encounter (Signed)
Assessment & Plan:  Cough in adult Chronic cough that appears to be longstanding based on a review of the chart notes but has been significantly worse since March per the patient's daughter.  She has been tried on methylprednisolone, hydrocodone cough syrup, Tessalon.  Also started empirically on Symbicort, albuterol.  She is not on an ACE inhibitor.  She denies any breakthrough GERD symptoms on PPI twice daily.  She does have some nasal congestion and is not on allergy regimen. 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    Called and spoke with Raven from [place] stating to her the information that was addended from [place] in regards to pt having clinical symptoms with COPD/fixed asthma. Raven stated she would need order resent with the diagnosis stating this. I have sent Rx to [facility] with the dx code of J44.9  Nothing further needed.

## 2018-12-19 NOTE — Telephone Encounter (Signed)
New Albuterol prescription sent to [facility], with instructions, 3 ml vial in nebulizer every 4 hours and as needed for [facility], and wheezing. Diagnosis code J44.9 in note to pharmacy. Nothing further at this time.

## 2018-12-20 ENCOUNTER — Telehealth: Payer: Self-pay | Admitting: Emergency Medicine

## 2018-12-20 MED ORDER — ALBUTEROL SULFATE (2.5 MG/3ML) 0.083% IN NEBU: 2.5000 mg | INHALATION_SOLUTION | RESPIRATORY_TRACT | 4 refills | Status: DC | PRN

## 2018-12-20 NOTE — Telephone Encounter (Signed)
Call returned to [place] with [facility], she states she needs the quantity of nebulizer changed to 190ml. Order printed and faxed to [phone] per Estill Bamberg. Nothing further is needed at this time.

## 2018-12-25 ENCOUNTER — Other Ambulatory Visit: Payer: 59 | Admitting: Internal Medicine

## 2018-12-25 ENCOUNTER — Other Ambulatory Visit: Payer: Self-pay

## 2018-12-25 DIAGNOSIS — Z515 Encounter for palliative care: Secondary | ICD-10-CM

## 2019-01-03 ENCOUNTER — Other Ambulatory Visit (HOSPITAL_COMMUNITY): Payer: Self-pay | Admitting: Physician Assistant

## 2019-01-03 ENCOUNTER — Other Ambulatory Visit (HOSPITAL_COMMUNITY): Payer: Self-pay | Admitting: Nephrology

## 2019-01-03 ENCOUNTER — Telehealth: Payer: Self-pay | Admitting: Emergency Medicine

## 2019-01-03 DIAGNOSIS — N186 End stage renal disease: Secondary | ICD-10-CM

## 2019-01-07 NOTE — Telephone Encounter (Signed)
Called and left message on pt vm to call back to schedule PFT -pr  °

## 2019-01-08 NOTE — Telephone Encounter (Signed)
Called and scheduled patient for PFT and f/u with RB on 02/12/2019-pr

## 2019-01-09 ENCOUNTER — Other Ambulatory Visit: Payer: Self-pay | Admitting: Radiology

## 2019-01-09 NOTE — Progress Notes (Signed)
Per daughter, pt took her coumadin last night and maybe this morning. Per Dr. Earleen Newport, ok to proceed tomorrow with procedure.

## 2019-01-10 ENCOUNTER — Ambulatory Visit (HOSPITAL_COMMUNITY)
Admission: RE | Admit: 2019-01-10 | Discharge: 2019-01-10 | Disposition: A | Discharge status: Home / Self Care | Payer: 59 | Source: Ambulatory Visit | Attending: Physician Assistant | Admitting: Physician Assistant

## 2019-01-10 ENCOUNTER — Other Ambulatory Visit: Payer: Self-pay

## 2019-01-10 ENCOUNTER — Encounter (HOSPITAL_COMMUNITY): Payer: Self-pay

## 2019-01-10 DIAGNOSIS — Z7951 Long term (current) use of inhaled steroids: Secondary | ICD-10-CM | POA: Insufficient documentation

## 2019-01-10 DIAGNOSIS — Z992 Dependence on renal dialysis: Secondary | ICD-10-CM | POA: Insufficient documentation

## 2019-01-10 DIAGNOSIS — Z833 Family history of diabetes mellitus: Secondary | ICD-10-CM | POA: Diagnosis not present

## 2019-01-10 DIAGNOSIS — I509 Heart failure, unspecified: Secondary | ICD-10-CM | POA: Insufficient documentation

## 2019-01-10 DIAGNOSIS — I132 Hypertensive heart and chronic kidney disease with heart failure and with stage 5 chronic kidney disease, or end stage renal disease: Secondary | ICD-10-CM | POA: Diagnosis not present

## 2019-01-10 DIAGNOSIS — Z8249 Family history of ischemic heart disease and other diseases of the circulatory system: Secondary | ICD-10-CM | POA: Diagnosis not present

## 2019-01-10 DIAGNOSIS — Y832 Surgical operation with anastomosis, bypass or graft as the cause of abnormal reaction of the patient, or of later complication, without mention of misadventure at the time of the procedure: Secondary | ICD-10-CM | POA: Insufficient documentation

## 2019-01-10 DIAGNOSIS — E1122 Type 2 diabetes mellitus with diabetic chronic kidney disease: Secondary | ICD-10-CM | POA: Insufficient documentation

## 2019-01-10 DIAGNOSIS — T82590A Other mechanical complication of surgically created arteriovenous fistula, initial encounter: Secondary | ICD-10-CM | POA: Diagnosis not present

## 2019-01-10 DIAGNOSIS — Z794 Long term (current) use of insulin: Secondary | ICD-10-CM | POA: Insufficient documentation

## 2019-01-10 DIAGNOSIS — N186 End stage renal disease: Secondary | ICD-10-CM | POA: Insufficient documentation

## 2019-01-10 DIAGNOSIS — Z79899 Other long term (current) drug therapy: Secondary | ICD-10-CM | POA: Insufficient documentation

## 2019-01-10 DIAGNOSIS — Z7901 Long term (current) use of anticoagulants: Secondary | ICD-10-CM | POA: Insufficient documentation

## 2019-01-10 DIAGNOSIS — Z95828 Presence of other vascular implants and grafts: Secondary | ICD-10-CM | POA: Insufficient documentation

## 2019-01-10 HISTORY — PX: IR DIALY SHUNT INTRO NEEDLE/INTRACATH INITIAL W/IMG LEFT: IMG6102

## 2019-01-10 LAB — PROTIME-INR
INR: 1.1 (ref 0.8–1.2)
Prothrombin Time: 14.2 seconds (ref 11.4–15.2)

## 2019-01-10 LAB — BASIC METABOLIC PANEL
Anion gap: 11 (ref 5–15)
BUN: 27 mg/dL — ABNORMAL HIGH (ref 8–23)
CO2: 27 mmol/L (ref 22–32)
Calcium: 8.6 mg/dL — ABNORMAL LOW (ref 8.9–10.3)
Chloride: 97 mmol/L — ABNORMAL LOW (ref 98–111)
Creatinine, Ser: 6.57 mg/dL — ABNORMAL HIGH (ref 0.44–1.00)
GFR calc Af Amer: 6 mL/min — ABNORMAL LOW (ref >60)
GFR calc non Af Amer: 6 mL/min — ABNORMAL LOW (ref >60)
Glucose, Bld: 133 mg/dL — ABNORMAL HIGH (ref 70–99)
Potassium: 4.1 mmol/L (ref 3.5–5.1)
Sodium: 135 mmol/L (ref 135–145)

## 2019-01-10 LAB — CBC
HCT: 33.6 % — ABNORMAL LOW (ref 36.0–46.0)
Hemoglobin: 10.7 g/dL — ABNORMAL LOW (ref 12.0–15.0)
MCH: 27.2 pg (ref 26.0–34.0)
MCHC: 31.8 g/dL (ref 30.0–36.0)
MCV: 85.5 fL (ref 80.0–100.0)
Platelets: 152 10*3/uL (ref 150–400)
RBC: 3.93 MIL/uL (ref 3.87–5.11)
RDW: 17.1 % — ABNORMAL HIGH (ref 11.5–15.5)
WBC: 6 10*3/uL (ref 4.0–10.5)
nRBC: 0 % (ref 0.0–0.2)

## 2019-01-10 MED ORDER — SODIUM CHLORIDE 0.9 % IV SOLN: INTRAVENOUS | Status: DC

## 2019-01-10 MED ORDER — HEPARIN SOD (PORK) LOCK FLUSH 100 UNIT/ML IV SOLN
500.0000 [IU] | INTRAVENOUS | Status: AC | PRN
  Administered 2019-01-10: 500 [IU]

## 2019-01-10 MED ORDER — IOHEXOL 300 MG/ML  SOLN
100.0000 mL | Freq: Once | INTRAMUSCULAR | Status: AC | PRN
  Administered 2019-01-10: 32 mL via INTRAVENOUS

## 2019-01-10 NOTE — Procedures (Signed)
Normally functioning left thigh AV graft without peripheral or central stenosis.  No intervention performed.  No immediate post procedural complication.   Ronny Bacon, MD Pager #: [phone]

## 2019-01-10 NOTE — H&P (Signed)
Chief Complaint: Patient was seen in consultation today for left thigh dialysis graft evaluation/possible intervention at the request of Janalee Dane  Referring Physician(s): Janalee Dane  Supervising Physician: Sandi Mariscal  Patient Status: [facility] - Out-pt  History of Present Illness: Ashlee Miles is a 78 y.o. female   ESRD Slow flow in Left thigh graft at dialysis yesterday Did complete dialysis Still with pulse- no thrill  Request made for left thigh graft evaluation/intervention  Last intervention 03/2018: Thrombolysis/thrombectomy in IR  Past Medical History:  Diagnosis Date  . CHF (congestive heart failure) ([place])   . Diabetes mellitus without complication ([place])   . ESRD (end stage renal disease) on dialysis ([place])   . History of pituitary adenoma   . Hypertension   . Protein C deficiency [facility])     Past Surgical History:  Procedure Laterality Date  . AV FISTULA PLACEMENT Left 03/16/2017   Procedure: INSERTION OF ARTERIOVENOUS (AV) GORE-TEX GRAFT THIGH-LEFT;  Surgeon: Angelia Mould, MD;  Location: [place];  Service: Vascular;  Laterality: Left;  . CHOLECYSTECTOMY    . DIALYSIS FISTULA CREATION     clotted off  . FALSE ANEURYSM REPAIR Left 05/10/2017   Procedure: REPAIR FALSE ANEURYSM left thigh AVGG using Gore 55mmx5cm Viabahn Endoprosthesis;  Surgeon: Serafina Mitchell, MD;  Location: [place];  Service: Vascular;  Laterality: Left;  . I&D EXTREMITY Left 07/14/2017   Procedure: IRRIGATION AND DEBRIDEMENT DISTAL THIGH ABSCESS;  Surgeon: Angelia Mould, MD;  Location: [place];  Service: Vascular;  Laterality: Left;  . INSERTION OF DIALYSIS CATHETER Right 03/16/2017   Procedure: INSERTION OF RIGHT FEMORAL TUNNELED DIALYSIS CATHETER;  Surgeon: Angelia Mould, MD;  Location: [place];  Service: Vascular;  Laterality: Right;  . IR AV DIALY SHUNT INTRO [place] W/PTA/IMG LEFT  11/16/2017  . IR THROMBECTOMY AV FISTULA  W/THROMBOLYSIS/PTA INC/SHUNT/IMG LEFT Left 06/22/2017  . IR THROMBECTOMY AV FISTULA W/THROMBOLYSIS/PTA/STENT INC/SHUNT/IMG LT Left 03/30/2018  . IR US GUIDE VASC ACCESS LEFT  06/22/2017  . IR US GUIDE VASC ACCESS LEFT  03/30/2018  . IVC FILTER INSERTION    . PORTACATH PLACEMENT    . THROMBECTOMY AND REVISION OF ARTERIOVENTOUS (AV) GORETEX  GRAFT Left 06/02/2017   Procedure: EVACUATION OF LEFT THIGH HEMATOMA  AND APPLICATION OF WOUND VAC;  Surgeon: Serafina Mitchell, MD;  Location: [place];  Service: Vascular;  Laterality: Left;    Allergies: Patient has no known allergies.  Medications: Prior to Admission medications   Medication Sig Start Date End Date Taking? Authorizing Provider  albuterol (PROVENTIL) (2.5 MG/3ML) 0.083% nebulizer solution 17ml vial by nebulizer every 4 hours And As Needed for wheezing and Shortness of breath 12/19/18  Yes Byrum, Rose Fillers, MD  albuterol (PROVENTIL) (2.5 MG/3ML) 0.083% nebulizer solution Take 3 mLs (2.5 mg total) by nebulization every 4 (four) hours as needed for wheezing or shortness of breath. Diagnosis COPD J44.9 12/20/18  Yes Byrum, Rose Fillers, MD  albuterol (VENTOLIN HFA) 108 (90 Base) MCG/ACT inhaler albuterol sulfate HFA 90 mcg/actuation aerosol inhaler   Yes [provider]  budesonide-formoterol (SYMBICORT) 160-4.5 MCG/ACT inhaler Symbicort 160 mcg-4.5 mcg/actuation HFA aerosol inhaler   Yes [provider]  calcitRIOL (ROCALTROL) 0.5 MCG capsule Take 0.5 mcg by mouth daily.   Yes [provider]  camphor-menthol Timoteo Ace) lotion camphor-menthol 0.5 %-0.5 % lotion  Sarna lotion, q 8hrs prn for itching   Yes [provider]  carbamide peroxide (DEBROX) 6.5 % OTIC solution Debrox 6.5 % ear drops  INSTILL 5 DROPS INTO AFFECTED EAR(S) BY OTIC ROUTE 2 TIMES PER DAY   Yes [provider]  carvedilol (COREG) 3.125 MG tablet  04/16/18  Yes [provider]  clobetasol cream (TEMOVATE) 0.05 % APPLY THIN COAT TO  AFFECTED AREA TWICE A DAY 11/04/18  Yes [provider]  docusate sodium (COLACE) 100 MG capsule Take 1 capsule (100 mg total) by mouth 2 (two) times daily. 04/12/17  Yes Sheikh, Omair Latif, DO  famotidine (PEPCID) 20 MG tablet Take 1 tablet (20 mg total) daily by mouth. 04/28/17  Yes Lavina Hamman, MD  gabapentin (NEURONTIN) 100 MG capsule Take 1 capsule (100 mg total) by mouth at bedtime. 07/21/17  Yes Dessa Phi, DO  HYDROcodone-acetaminophen (NORCO) 7.5-325 MG tablet hydrocodone 7.5 mg-acetaminophen 325 mg tablet   Yes [provider]  insulin aspart protamine- aspart (NOVOLOG MIX 70/30) (70-30) 100 UNIT/ML injection Inject 30-40 Units into the skin See admin instructions. 40 units in the morning and 30 units in the evening or Sliding Scale   Yes [provider]  Insulin Lispro Prot & Lispro (HUMALOG MIX 75/25 KWIKPEN) (75-25) 100 UNIT/ML Kwikpen Humalog Mix 75-25 KwikPen U-100 insulin 100 unit/mL subcutaneous pen   Yes [provider]  lidocaine-prilocaine (EMLA) cream APPLY TOPICALLY TO LEFT LEG GRAFT 1 HOUR BEFORE DIALYSIS AND COVER WITH PLASTIC WRAP 10/10/18  Yes [provider]  midodrine (PROAMATINE) 10 MG tablet Take 1 tablet (10 mg total) by mouth every Monday, Wednesday, and Friday with hemodialysis. 04/14/17  Yes Sheikh, Omair Latif, DO  multivitamin (RENA-VIT) TABS tablet Take 1 tablet by mouth at bedtime. 04/12/17  Yes Sheikh, Omair Latif, DO  nystatin-triamcinolone (MYCOLOG II) cream nystatin-triamcinolone 100,000 unit/g-0.1 % topical cream  Apply 1 application topically every 12 (twelve) hours as needed.   Yes [provider]  pantoprazole (PROTONIX) 40 MG tablet Take 1 tablet (40 mg total) 2 (two) times daily before a meal by mouth. 04/28/17  Yes Lavina Hamman, MD  PARoxetine (PAXIL) 10 MG tablet Take 1 tablet (10 mg total) daily by mouth. 04/28/17  Yes Lavina Hamman, MD  polyethylene glycol [facility] / GLYCOLAX) packet Take 17  g daily by mouth. 04/28/17  Yes Lavina Hamman, MD  sevelamer carbonate (RENVELA) 800 MG tablet Take 800 mg by mouth 3 (three) times daily with meals.   Yes [provider]  simvastatin (ZOCOR) 40 MG tablet Take 40 mg by mouth daily at 6 PM.   Yes [provider]  sucralfate (CARAFATE) 1 GM/10ML suspension Take 10 mLs (1 g total) 4 (four) times daily -  with meals and at bedtime by mouth. 04/28/17  Yes Lavina Hamman, MD  thiamine (VITAMIN B-1) 100 MG tablet Take 1 tablet (100 mg total) by mouth daily. 06/09/17  Yes Dessa Phi, DO  triamcinolone cream (KENALOG) 0.1 % Apply 1 application topically every 12 (twelve) hours as needed.    Yes [provider]  warfarin (COUMADIN) 5 MG tablet Take 5 mg by mouth daily at 6 PM.   Yes [provider]  Amino Acids-Protein Hydrolys (FEEDING SUPPLEMENT, PRO-STAT SUGAR FREE 64,) LIQD Take 30 mLs by mouth 2 (two) times daily. 04/12/17   Sheikh, Omair Latif, DO  benzonatate (TESSALON PERLES) 100 MG capsule Tessalon Perles 100 mg capsule  Take 1 capsule 3 times a day by oral route for 7 days.    [provider]  collagenase (SANTYL) ointment Apply topically 2 (two) times daily. Apply to L thigh  wound BID. Wet-to-dry dressing on top Santyl BID 07/21/17   Dessa Phi, DO     Family History  Problem Relation Age of Onset  . Breast cancer Mother   . Diabetes Sister   . Diabetes Brother   . CAD Other   . Stroke Neg Hx     Social History   Socioeconomic History  . Marital status: Married    Spouse name: Not on file  . Number of children: Not on file  . Years of education: Not on file  . Highest education level: Not on file  Occupational History  . Occupation: retired  Scientific laboratory technician  . Financial resource strain: Not on file  . Food insecurity    Worry: Not on file    Inability: Not on file  . Transportation needs    Medical: Not on file    Non-medical: Not on file  Tobacco Use  . Smoking status: Never  Smoker  . Smokeless tobacco: Never Used  Substance and Sexual Activity  . Alcohol use: No  . Drug use: No  . Sexual activity: Not on file  Lifestyle  . Physical activity    Days per week: 0 days    Minutes per session: Not on file  . Stress: To some extent  Relationships  . Social Herbalist on phone: Patient refused    Gets together: Patient refused    Attends religious service: Patient refused    Active member of club or organization: Patient refused    Attends meetings of clubs or organizations: Patient refused    Relationship status: Patient refused  Other Topics Concern  . Not on file  Social History Narrative  . Not on file    Review of Systems: A 12 point ROS discussed and pertinent positives are indicated in the HPI above.  All other systems are negative.  Review of Systems  Constitutional: Negative for activity change, fatigue and fever.  Cardiovascular: Negative for chest pain.  Gastrointestinal: Negative for abdominal pain.  Musculoskeletal: Negative for back pain and gait problem.  Neurological: Negative for weakness.  Psychiatric/Behavioral: Negative for behavioral problems.    Vital Signs: BP 92/73   Pulse 69   Temp (!) 97.2 F (36.2 C) (Oral)   Ht 5\' 2"  (1.575 m)   Wt 207 lb (93.9 kg)   SpO2 100%   BMI 37.86 kg/m   Physical Exam Vitals signs reviewed.  Cardiovascular:     Rate and Rhythm: Normal rate and regular rhythm.     Heart sounds: Normal heart sounds.  Pulmonary:     Effort: Pulmonary effort is normal.     Breath sounds: Normal breath sounds.  Musculoskeletal: Normal range of motion.     Comments: Left thigh graft 2+ pulses No thrill  Skin:    General: Skin is warm and dry.  Neurological:     Mental Status: She is alert and oriented to person, place, and time.  Psychiatric:        Mood and Affect: Mood normal.        Behavior: Behavior normal.        Thought Content: Thought content normal.        Judgment: Judgment  normal.     Imaging: Dg Chest 2 View  Result Date: 12/11/2018 CLINICAL DATA:  Dyspnea today. EXAM: CHEST - 2 VIEW COMPARISON:  PA and lateral chest 06/22/2017. FINDINGS: Port-A-Cath is unchanged. Lungs are clear. Heart size is normal. Aortic atherosclerosis is noted. No  pneumothorax or pleural effusion. Vascular stent left upper arm is identified. No acute or focal bony abnormality. IMPRESSION: No acute disease. Atherosclerosis. Electronically Signed   By: Inge Rise M.D.   On: 12/11/2018 15:01    Labs:  CBC: No results for input(s): WBC, HGB, HCT, PLT in the last 8760 hours.  COAGS: Recent Labs    03/30/18 1248  INR 1.45    BMP: Recent Labs    03/30/18 1248  NA 137  K 4.8  CL 102  CO2 24  GLUCOSE 117*  BUN 59*  CALCIUM 7.4*  CREATININE 7.72*  GFRNONAA 5*  GFRAA 5*    LIVER FUNCTION TESTS: No results for input(s): BILITOT, AST, ALT, ALKPHOS, PROT, ALBUMIN in the last 8760 hours.  TUMOR MARKERS: No results for input(s): AFPTM, CEA, CA199, CHROMGRNA in the last 8760 hours.  Assessment and Plan:  ESRD Slow flow left thigh graft in dialysis yesterday Scheduled for evaluation of graft and possible intervention Possible tunneled catheter placement if needed Pt is aware of procedure benefits and risks- including but not limited to Infection; bleeding; damage to surrounding structures Pt is agreeable to proceed Consent signed   Thank you for this interesting consult.  I greatly enjoyed meeting Ashlee Miles and look forward to participating in their care.  A copy of this report was sent to the requesting provider on this date.  Electronically Signed: Lavonia Drafts, PA-C 01/10/2019, 11:09 AM   I spent a total of  30 Minutes   in face to face in clinical consultation, greater than 50% of which was counseling/coordinating care for left thigh graft evaluation

## 2019-01-11 ENCOUNTER — Encounter (HOSPITAL_COMMUNITY): Payer: 59

## 2019-01-11 NOTE — Progress Notes (Signed)
    [facility] Palliative Care Consult Note Telephone: [phone]  Fax: [phone]  PATIENT NAME: Ashlee Miles DOB: Nov 13, 1940 MRN: [id number]  PRIMARY CARE PROVIDER:   [name], MD  REFERRING PROVIDER:  [name], MD [place],  [place] [address]  RESPONSIBLE PARTY:   Self and [name] Lathon(daughter)    RECOMMENDATIONS and PLAN:  Palliative Care Encounter  Z51.5  1. Memory Loss:  Stable with FAST stage 6b.  No behaviors reported.  Continue assistance at home by daughter and personal aid.    2.  Fatigue:  Chronic as related to ESRD with hemodialysis TID.  Stabilizes on off HD days with adequate rest.  Continue to monitor.   3. .Chronic knee pain: Arthritic pain.   Continue topical analgesics, scheduled Tylenol and prn Hydrocodone. Reviewed maximum dosing of Tylenol products.    4.  Advanced care planning:  Pt.. desires to continue hemodialysis treatments.   DNAR code status, no intubation. She will continue to live with daughter who assists with her care.  Follow-up in 2 months.    I spent 30 minutes providing this consultation,  from 1500 to 1530. More than 50% of the time in this consultation was spent coordinating communication with patient.   HISTORY OF PRESENT ILLNESS: Follow-up with [name] . Pt denies any recent acute illnesses or hospitalizations.  She continues HD 3x/week and is tolerating same well.  She remains ambulatory with assist from a walker and requires some assistance with ADLs.  Continent of B&B.  Palliative Care was asked to help address goals of care.   CODE STATUS: DNAR  PPS: 50% HOSPICE ELIGIBILITY/DIAGNOSIS: TBD  PAST MEDICAL HISTORY:  Past Medical History:  Diagnosis Date  . CHF (congestive heart failure) ([place])   . Diabetes mellitus without complication ([place])   . ESRD (end stage renal disease) on dialysis ([place])   . History of pituitary adenoma   . Hypertension   . Protein C deficiency  ([place])       PHYSICAL EXAM:   General: NAD, well nourished elderly female in recliner Cardiovascular: regular rate and rhythm Pulmonary: clear in all fields Abdomen: soft, nontender, + bowel sounds Extremities: edema and tenderness right knee Skin: exposed skin intact Neurological: A&O x3  Ashlee Lex, NP-C

## 2019-01-17 ENCOUNTER — Ambulatory Visit (HOSPITAL_COMMUNITY)
Admission: RE | Admit: 2019-01-17 | Discharge: 2019-01-17 | Disposition: A | Discharge status: Home / Self Care | Payer: 59 | Source: Ambulatory Visit | Attending: Internal Medicine | Admitting: Internal Medicine

## 2019-01-17 ENCOUNTER — Other Ambulatory Visit: Payer: Self-pay

## 2019-01-17 DIAGNOSIS — Z452 Encounter for adjustment and management of vascular access device: Secondary | ICD-10-CM | POA: Insufficient documentation

## 2019-01-17 MED ORDER — HEPARIN SOD (PORK) LOCK FLUSH 100 UNIT/ML IV SOLN
500.0000 [IU] | INTRAVENOUS | Status: AC | PRN
  Administered 2019-01-17: 500 [IU]

## 2019-01-17 MED ORDER — SODIUM CHLORIDE 0.9% FLUSH
10.0000 mL | INTRAVENOUS | Status: AC | PRN
  Administered 2019-01-17: 10 mL

## 2019-01-17 NOTE — Progress Notes (Signed)
PATIENT CARE CENTER NOTE   Provider:Philip Asenso, MD   Procedure:Port-a-cath flush   Note:Patient's PAC assessed and flushed with Heparin and 0.9% Sodium Chloride. Sterile procedure followed. PAC deassessed andsitecovered with band-aid. Patient tolerated well. Alert, oriented and ambulatory to wheelchair at discharge. 

## 2019-01-17 NOTE — Discharge Instructions (Signed)
Port-a-cath flushed with Heparin and 0.9% Sodium Chloride 

## 2019-02-05 ENCOUNTER — Other Ambulatory Visit: Payer: Self-pay | Admitting: Emergency Medicine

## 2019-02-07 ENCOUNTER — Ambulatory Visit (HOSPITAL_COMMUNITY)
Admission: RE | Admit: 2019-02-07 | Discharge: 2019-02-07 | Disposition: A | Discharge status: Home / Self Care | Payer: 59 | Source: Ambulatory Visit | Attending: Internal Medicine | Admitting: Internal Medicine

## 2019-02-07 ENCOUNTER — Other Ambulatory Visit: Payer: Self-pay

## 2019-02-07 DIAGNOSIS — Z452 Encounter for adjustment and management of vascular access device: Secondary | ICD-10-CM | POA: Insufficient documentation

## 2019-02-07 MED ORDER — SODIUM CHLORIDE 0.9% FLUSH
10.0000 mL | INTRAVENOUS | Status: AC | PRN
  Administered 2019-02-07: 10 mL

## 2019-02-07 MED ORDER — HEPARIN SOD (PORK) LOCK FLUSH 100 UNIT/ML IV SOLN
500.0000 [IU] | INTRAVENOUS | Status: AC | PRN
  Administered 2019-02-07: 500 [IU]
  Filled 2019-02-07: qty 5

## 2019-02-07 NOTE — Discharge Instructions (Signed)
Port flush °

## 2019-02-07 NOTE — Progress Notes (Signed)
                   Patient [place] Not    Provider: [name], MD    Procedure: Port-a-cath-flush   Note: Patient's PAC accessed and flushed with 0/9% sodium chloride and heparin with sterile technique and deaccessed.  Patient tolerated well. Alert, oriented and ambulatory to wheelchair at discharge  [facility], RN

## 2019-02-09 ENCOUNTER — Other Ambulatory Visit (HOSPITAL_COMMUNITY)
Admission: RE | Admit: 2019-02-09 | Discharge: 2019-02-09 | Disposition: A | Discharge status: Home / Self Care | Payer: 59 | Source: Ambulatory Visit | Attending: Emergency Medicine | Admitting: Emergency Medicine

## 2019-02-09 DIAGNOSIS — Z20828 Contact with and (suspected) exposure to other viral communicable diseases: Secondary | ICD-10-CM | POA: Diagnosis not present

## 2019-02-09 DIAGNOSIS — Z01812 Encounter for preprocedural laboratory examination: Secondary | ICD-10-CM | POA: Insufficient documentation

## 2019-02-09 LAB — SARS CORONAVIRUS 2 (TAT 6-24 HRS): SARS Coronavirus 2: NEGATIVE

## 2019-02-12 ENCOUNTER — Other Ambulatory Visit: Payer: Self-pay

## 2019-02-12 ENCOUNTER — Ambulatory Visit (INDEPENDENT_AMBULATORY_CARE_PROVIDER_SITE_OTHER): Payer: 59 | Admitting: Emergency Medicine

## 2019-02-12 ENCOUNTER — Encounter: Payer: Self-pay | Admitting: Emergency Medicine

## 2019-02-12 DIAGNOSIS — R05 Cough: Secondary | ICD-10-CM

## 2019-02-12 DIAGNOSIS — R0602 Shortness of breath: Secondary | ICD-10-CM

## 2019-02-12 DIAGNOSIS — R059 Cough, unspecified: Secondary | ICD-10-CM

## 2019-02-12 DIAGNOSIS — R0600 Dyspnea, unspecified: Secondary | ICD-10-CM | POA: Insufficient documentation

## 2019-02-12 NOTE — Assessment & Plan Note (Signed)
She was unable to do pulmonary function testing.  I think that there is low likelihood for obstructive disease, she did not miss the Symbicort.  I would not restart.  I think she might benefit from physical therapy, pulmonary rehab to build up her exertional tolerance and stamina.

## 2019-02-12 NOTE — Progress Notes (Signed)
Subjective:    Patient ID: Ashlee Miles, female    DOB: 1940-07-15, 78 y.o.   MRN: [id number]  HPI 78 year old woman, never smoker, with a history of end-stage renal disease on hemodialysis, diabetes, hypertension, protein C deficiency w Hx DVT.  She is referred today for evaluation of chronic cough. She is treated w PPI bid for GERD. Pt denies any significant GERD currently. She has abdominal and lower chest pain that she ascribes to the cough. Symbicort is on her med list, looks like it was started on 10/28/2018? She states that she has allergy sx, can be associated with the cough, nasal drainage. She has evidence for obstruction, COPD clinically. Will be reasonable to continue albuterol and confirm by spirometry.   Pt unable to give an accurate history but information from her daughter is available.  She states that patient began to have cough in March, typically nonproductive.  Can be paroxysmal and lead to gasping.  She is been treated with hydrocodone cough syrup, more recently oxycodone for both cough and pain.  About a week ago she was empirically started on Symbicort and albuterol to see if she would get benefit. She isn't sure whether it is helping her. Also received methyl-pred taper.   ROV 02/12/2019 --Ashlee Miles is a 51, never smoker with a history of end-stage renal disease on hemodialysis, diabetes, hypertension, history of DVT (protein C deficiency), GERD.  I have seen her for longstanding cough in June.  She had been started empirically on Symbicort which I stopped - she hasn't missed it.  We arranged for pulmonary function testing but unfortunately she was unable to do these today.  I left her on Protonix twice a day, started loratadine. She cannot recall her meds.  She states that her cough continues to bother her, ? Exposure related. Nasal congestion has improved, denies any GERD sx. She is in a wheelchair most of the time, is dyspneic when she ambulates, no CP or wheeze. Minimal  exercise.    Review of Systems  Constitutional: Negative.  Negative for fever and unexpected weight change.  HENT: Negative for congestion, dental problem, ear pain, nosebleeds, postnasal drip, rhinorrhea, sinus pressure, sneezing, sore throat and trouble swallowing.   Eyes: Negative for redness and itching.  Respiratory: Positive for cough, shortness of breath and wheezing. Negative for chest tightness.   Cardiovascular: Negative.  Negative for palpitations and leg swelling.  Gastrointestinal: Negative.  Negative for nausea and vomiting.  Genitourinary: Negative.  Negative for dysuria.  Musculoskeletal: Negative.  Negative for joint swelling.  Skin: Negative for rash.  Allergic/Immunologic: Positive for environmental allergies. Negative for food allergies and immunocompromised state.  Neurological: Negative.  Negative for headaches.  Hematological: Does not bruise/bleed easily.  Psychiatric/Behavioral: Negative.  Negative for dysphoric mood. The patient is not nervous/anxious.        Objective:   Physical Exam Vitals:   02/12/19 1341  BP: 112/70  Pulse: 70  SpO2: 98%  Weight: 212 lb (96.2 kg)  Height: 5\' 2"  (1.575 m)   Gen: Pleasant, elderly woman in wheelchair, in no distress,  normal affect  ENT: No lesions,  mouth clear, post pharynx crowded,  oropharynx clear, no postnasal drip  Neck: No JVD, no stridor  Lungs: No use of accessory muscles, no crackles or wheezing on normal respiration, no wheeze on forced expiration  Cardiovascular: RRR, heart sounds normal, no murmur or gallops, trace peripheral edema  Musculoskeletal: No deformities, no cyanosis or clubbing  Neuro: alert, awake, poor memory.  Unable to give hx, list meds, etc  Skin: Warm, no lesions or rash     Assessment & Plan:  Cough in adult Difficult to get a good history but she does still have some intermittent cough.  Dry, nonproductive.  Denies any overt GERD symptoms.  Maybe a little bit better since  starting loratadine.  She did not miss the Symbicort and I will not restart.  Plan to continue same regimen.  She was unable to do pulmonary function testing.  Chest x-ray was reassuring.  No new interventions to be made at this time  Dyspnea She was unable to do pulmonary function testing.  I think that there is low likelihood for obstructive disease, she did not miss the Symbicort.  I would not restart.  I think she might benefit from physical therapy, pulmonary rehab to build up her exertional tolerance and stamina.  Baltazar Apo, MD, PhD 02/12/2019, 1:58 PM  Pulmonary and Critical Care [phone] or if no answer [phone]

## 2019-02-12 NOTE — Patient Instructions (Signed)
We will not start any scheduled inhaled medications at this time.  Please continue your protonix twice a day Please continue your loratadine once a day You might benefit from getting physical therapy or pulmonary rehab to build up your stamina.  Follow with Dr Lamonte Sakai if needed.

## 2019-02-12 NOTE — Assessment & Plan Note (Signed)
Difficult to get a good history but she does still have some intermittent cough.  Dry, nonproductive.  Denies any overt GERD symptoms.  Maybe a little bit better since starting loratadine.  She did not miss the Symbicort and I will not restart.  Plan to continue same regimen.  She was unable to do pulmonary function testing.  Chest x-ray was reassuring.  No new interventions to be made at this time

## 2019-02-14 ENCOUNTER — Other Ambulatory Visit: Payer: Self-pay

## 2019-02-14 ENCOUNTER — Other Ambulatory Visit: Payer: 59 | Admitting: Internal Medicine

## 2019-02-20 ENCOUNTER — Telehealth: Payer: Self-pay | Admitting: Internal Medicine

## 2019-02-20 ENCOUNTER — Other Ambulatory Visit: Payer: Self-pay | Admitting: Internal Medicine

## 2019-02-20 DIAGNOSIS — Z1231 Encounter for screening mammogram for malignant neoplasm of breast: Secondary | ICD-10-CM

## 2019-02-20 NOTE — Telephone Encounter (Signed)
Rec'd call from patient's daughter Belenda Cruise requesting to schedule a f/u visit with NP.  I have scheduled a Telephone F/u visit for 02/28/19 @ 1:30 PM.

## 2019-02-26 ENCOUNTER — Ambulatory Visit (INDEPENDENT_AMBULATORY_CARE_PROVIDER_SITE_OTHER): Payer: 59 | Admitting: Podiatry

## 2019-02-26 ENCOUNTER — Encounter: Payer: Self-pay | Admitting: Podiatry

## 2019-02-26 ENCOUNTER — Other Ambulatory Visit: Payer: Self-pay

## 2019-02-26 DIAGNOSIS — D689 Coagulation defect, unspecified: Secondary | ICD-10-CM | POA: Diagnosis not present

## 2019-02-26 DIAGNOSIS — E1142 Type 2 diabetes mellitus with diabetic polyneuropathy: Secondary | ICD-10-CM | POA: Diagnosis not present

## 2019-02-26 DIAGNOSIS — M79675 Pain in left toe(s): Secondary | ICD-10-CM

## 2019-02-26 DIAGNOSIS — B351 Tinea unguium: Secondary | ICD-10-CM | POA: Diagnosis not present

## 2019-02-26 DIAGNOSIS — M79674 Pain in right toe(s): Secondary | ICD-10-CM

## 2019-02-26 NOTE — Progress Notes (Signed)
Complaint:  Visit Type: Patient returns to my office for continued preventative foot care services. Complaint: Patient states" my nails have grown long and thick and become painful to walk and wear shoes" Patient has been diagnosed with DM with no foot complications. The patient presents for preventative foot care services. No changes to ROS.  Patient presents to the office with her son. .Patient is taking coumadin.  Podiatric Exam: Vascular: dorsalis pedis and posterior tibial pulses are weakly  palpable bilateral. Capillary return is immediate. Temperature gradient is WNL. Skin turgor WNL  Sensorium: Normal Semmes Weinstein monofilament test. Normal tactile sensation bilaterally. Nail Exam: Pt has thick disfigured discolored nails with subungual debris noted bilateral entire nail hallux through fifth toenails Ulcer Exam: There is no evidence of ulcer or pre-ulcerative changes or infection. Orthopedic Exam: Muscle tone and strength are WNL. No limitations in general ROM. No crepitus or effusions noted. Foot type and digits show no abnormalities. Bony prominences are unremarkable. Skin: No Porokeratosis. No infection or ulcers  Diagnosis:  Onychomycosis, , Pain in right toe, pain in left toes  Treatment & Plan Procedures and Treatment: Consent by patient was obtained for treatment procedures.   Debridement of mycotic and hypertrophic toenails, 1 through 5 bilateral and clearing of subungual debris. No ulceration, no infection noted.  Return Visit-Office Procedure: Patient instructed to return to the office for a follow up visit 3 months for continued evaluation and treatment.      Gardiner Barefoot DPM

## 2019-02-28 ENCOUNTER — Other Ambulatory Visit: Payer: 59 | Admitting: Internal Medicine

## 2019-02-28 ENCOUNTER — Other Ambulatory Visit: Payer: Self-pay

## 2019-03-14 ENCOUNTER — Other Ambulatory Visit: Payer: Self-pay

## 2019-03-14 ENCOUNTER — Ambulatory Visit (HOSPITAL_COMMUNITY)
Admission: RE | Admit: 2019-03-14 | Discharge: 2019-03-14 | Disposition: A | Discharge status: Home / Self Care | Payer: 59 | Source: Ambulatory Visit | Attending: Internal Medicine | Admitting: Internal Medicine

## 2019-03-14 DIAGNOSIS — Z452 Encounter for adjustment and management of vascular access device: Secondary | ICD-10-CM | POA: Insufficient documentation

## 2019-03-14 DIAGNOSIS — N186 End stage renal disease: Secondary | ICD-10-CM | POA: Diagnosis not present

## 2019-03-14 DIAGNOSIS — E1122 Type 2 diabetes mellitus with diabetic chronic kidney disease: Secondary | ICD-10-CM | POA: Diagnosis not present

## 2019-03-14 DIAGNOSIS — Z992 Dependence on renal dialysis: Secondary | ICD-10-CM | POA: Insufficient documentation

## 2019-03-14 MED ORDER — SODIUM CHLORIDE 0.9% FLUSH
10.0000 mL | INTRAVENOUS | Status: AC | PRN
  Administered 2019-03-14: 10 mL

## 2019-03-14 MED ORDER — HEPARIN SOD (PORK) LOCK FLUSH 100 UNIT/ML IV SOLN
500.0000 [IU] | INTRAVENOUS | Status: AC | PRN
  Administered 2019-03-14: 14:00:00 500 [IU]
  Filled 2019-03-14: qty 5

## 2019-03-14 NOTE — Progress Notes (Signed)
Implanted port flushed with 10 ml Normal saline flush and 500 units of heparin per protocol. Tolerated well, discharge instructions given, verbalized understanding. Patient alert, oriented and transported in a wheelchair at the time of discharge.

## 2019-03-19 ENCOUNTER — Other Ambulatory Visit: Payer: Self-pay

## 2019-03-19 ENCOUNTER — Other Ambulatory Visit: Payer: 59 | Admitting: Internal Medicine

## 2019-03-29 DIAGNOSIS — T782XXS Anaphylactic shock, unspecified, sequela: Secondary | ICD-10-CM | POA: Insufficient documentation

## 2019-04-09 ENCOUNTER — Other Ambulatory Visit: Payer: Self-pay

## 2019-04-09 ENCOUNTER — Ambulatory Visit
Admission: RE | Admit: 2019-04-09 | Discharge: 2019-04-09 | Disposition: A | Discharge status: Home / Self Care | Payer: 59 | Source: Ambulatory Visit | Attending: Internal Medicine | Admitting: Internal Medicine

## 2019-04-09 DIAGNOSIS — Z1231 Encounter for screening mammogram for malignant neoplasm of breast: Secondary | ICD-10-CM

## 2019-04-11 ENCOUNTER — Other Ambulatory Visit: Payer: Self-pay

## 2019-04-11 ENCOUNTER — Ambulatory Visit (HOSPITAL_COMMUNITY)
Admission: RE | Admit: 2019-04-11 | Discharge: 2019-04-11 | Disposition: A | Discharge status: Home / Self Care | Payer: 59 | Source: Ambulatory Visit | Attending: Internal Medicine | Admitting: Internal Medicine

## 2019-04-11 DIAGNOSIS — Z452 Encounter for adjustment and management of vascular access device: Secondary | ICD-10-CM | POA: Insufficient documentation

## 2019-04-11 MED ORDER — HEPARIN SOD (PORK) LOCK FLUSH 100 UNIT/ML IV SOLN
500.0000 [IU] | INTRAVENOUS | Status: AC | PRN
  Administered 2019-04-11: 500 [IU]
  Filled 2019-04-11: qty 5

## 2019-04-11 MED ORDER — SODIUM CHLORIDE 0.9% FLUSH
10.0000 mL | INTRAVENOUS | Status: AC | PRN
  Administered 2019-04-11: 10 mL

## 2019-04-11 NOTE — Progress Notes (Signed)
PATIENT CARE CENTER NOTE   Provider:Philip Asenso, MD   Procedure:Port-a-cath flush   Note:Patient's PAC assessed and flushed with Heparin and 0.9% Sodium Chloride. Sterile procedure followed. PAC deassessed andsitecovered with band-aid. Patient tolerated well. Alert, oriented and ambulatory to wheelchair at discharge. 

## 2019-04-11 NOTE — Discharge Instructions (Signed)
Port-a-cath flushed with Heparin and 0.9% Sodium Chloride 

## 2019-05-07 ENCOUNTER — Encounter (HOSPITAL_COMMUNITY): Payer: 59

## 2019-05-13 ENCOUNTER — Ambulatory Visit (HOSPITAL_COMMUNITY)
Admission: RE | Admit: 2019-05-13 | Discharge: 2019-05-13 | Disposition: A | Discharge status: Home / Self Care | Payer: 59 | Source: Ambulatory Visit | Attending: Internal Medicine | Admitting: Internal Medicine

## 2019-05-13 ENCOUNTER — Other Ambulatory Visit: Payer: Self-pay

## 2019-05-13 DIAGNOSIS — Z452 Encounter for adjustment and management of vascular access device: Secondary | ICD-10-CM | POA: Diagnosis present

## 2019-05-13 MED ORDER — HEPARIN SOD (PORK) LOCK FLUSH 100 UNIT/ML IV SOLN
500.0000 [IU] | INTRAVENOUS | Status: AC | PRN
  Administered 2019-05-13: 500 [IU]

## 2019-05-13 MED ORDER — SODIUM CHLORIDE 0.9% FLUSH
10.0000 mL | INTRAVENOUS | Status: AC | PRN
  Administered 2019-05-13: 10 mL

## 2019-05-13 NOTE — Progress Notes (Signed)
Implanted port was flushed with 500 units of heparin and 10 cc of normal saline per protocol. Patient tolerated well, alert oriented  and transported in a wheel chair.

## 2019-05-28 ENCOUNTER — Ambulatory Visit (INDEPENDENT_AMBULATORY_CARE_PROVIDER_SITE_OTHER): Payer: 59 | Admitting: Podiatry

## 2019-05-28 ENCOUNTER — Other Ambulatory Visit: Payer: Self-pay

## 2019-05-28 ENCOUNTER — Encounter: Payer: Self-pay | Admitting: Podiatry

## 2019-05-28 DIAGNOSIS — D689 Coagulation defect, unspecified: Secondary | ICD-10-CM

## 2019-05-28 DIAGNOSIS — M79675 Pain in left toe(s): Secondary | ICD-10-CM | POA: Diagnosis not present

## 2019-05-28 DIAGNOSIS — E1142 Type 2 diabetes mellitus with diabetic polyneuropathy: Secondary | ICD-10-CM

## 2019-05-28 DIAGNOSIS — B351 Tinea unguium: Secondary | ICD-10-CM | POA: Diagnosis not present

## 2019-05-28 DIAGNOSIS — M79674 Pain in right toe(s): Secondary | ICD-10-CM

## 2019-05-28 NOTE — Progress Notes (Signed)
Complaint:  Visit Type: Patient returns to my office for continued preventative foot care services. Complaint: Patient states" my nails have grown long and thick and become painful to walk and wear shoes" Patient has been diagnosed with DM with no foot complications. The patient presents for preventative foot care services. No changes to ROS.  Patient presents to the office with her son. .Patient is taking coumadin.  Podiatric Exam: Vascular: dorsalis pedis and posterior tibial pulses are weakly  palpable bilateral. Capillary return is immediate. Temperature gradient is WNL. Skin turgor WNL  Sensorium: Normal Semmes Weinstein monofilament test. Normal tactile sensation bilaterally. Nail Exam: Pt has thick disfigured discolored nails with subungual debris noted bilateral entire nail hallux through fifth toenails Ulcer Exam: There is no evidence of ulcer or pre-ulcerative changes or infection. Orthopedic Exam: Muscle tone and strength are WNL. No limitations in general ROM. No crepitus or effusions noted. Foot type and digits show no abnormalities. Bony prominences are unremarkable. Skin: No Porokeratosis. No infection or ulcers  Diagnosis:  Onychomycosis, , Pain in right toe, pain in left toes  Treatment & Plan Procedures and Treatment: Consent by patient was obtained for treatment procedures.   Debridement of mycotic and hypertrophic toenails, 1 through 5 bilateral and clearing of subungual debris. No ulceration, no infection noted.  Return Visit-Office Procedure: Patient instructed to return to the office for a follow up visit 3 months for continued evaluation and treatment.      Gardiner Barefoot DPM

## 2019-06-11 ENCOUNTER — Encounter (HOSPITAL_COMMUNITY): Payer: 59

## 2019-06-25 ENCOUNTER — Other Ambulatory Visit: Payer: Self-pay

## 2019-06-25 ENCOUNTER — Ambulatory Visit (HOSPITAL_COMMUNITY)
Admission: RE | Admit: 2019-06-25 | Discharge: 2019-06-25 | Disposition: A | Discharge status: Home / Self Care | Payer: 59 | Source: Ambulatory Visit | Attending: Internal Medicine | Admitting: Internal Medicine

## 2019-06-25 DIAGNOSIS — Z452 Encounter for adjustment and management of vascular access device: Secondary | ICD-10-CM | POA: Diagnosis present

## 2019-06-25 MED ORDER — HEPARIN SOD (PORK) LOCK FLUSH 100 UNIT/ML IV SOLN
500.0000 [IU] | INTRAVENOUS | Status: AC | PRN
  Administered 2019-06-25: 500 [IU]

## 2019-06-25 MED ORDER — SODIUM CHLORIDE 0.9% FLUSH
10.0000 mL | INTRAVENOUS | Status: AC | PRN
  Administered 2019-06-25: 10 mL

## 2019-06-25 NOTE — Progress Notes (Signed)
Implanted port was flushed with 500 units of heparin and 10 cc of normal saline. Patient tolerated well, alert, oriented and transported in a wheelchair.

## 2019-07-23 ENCOUNTER — Ambulatory Visit (HOSPITAL_COMMUNITY): Payer: 59

## 2019-08-15 ENCOUNTER — Encounter (HOSPITAL_COMMUNITY): Payer: 59

## 2019-08-16 ENCOUNTER — Emergency Department (HOSPITAL_COMMUNITY)
Admission: EM | Admit: 2019-08-16 | Discharge: 2019-08-16 | Disposition: A | Discharge status: Home / Self Care | Payer: 59 | Attending: Emergency Medicine | Admitting: Emergency Medicine

## 2019-08-16 ENCOUNTER — Encounter (HOSPITAL_COMMUNITY): Payer: Self-pay

## 2019-08-16 ENCOUNTER — Other Ambulatory Visit: Payer: Self-pay

## 2019-08-16 DIAGNOSIS — Z992 Dependence on renal dialysis: Secondary | ICD-10-CM | POA: Insufficient documentation

## 2019-08-16 DIAGNOSIS — I5032 Chronic diastolic (congestive) heart failure: Secondary | ICD-10-CM | POA: Insufficient documentation

## 2019-08-16 DIAGNOSIS — Y658 Other specified misadventures during surgical and medical care: Secondary | ICD-10-CM | POA: Diagnosis not present

## 2019-08-16 DIAGNOSIS — I132 Hypertensive heart and chronic kidney disease with heart failure and with stage 5 chronic kidney disease, or end stage renal disease: Secondary | ICD-10-CM | POA: Diagnosis not present

## 2019-08-16 DIAGNOSIS — N186 End stage renal disease: Secondary | ICD-10-CM | POA: Diagnosis not present

## 2019-08-16 DIAGNOSIS — Z7901 Long term (current) use of anticoagulants: Secondary | ICD-10-CM | POA: Diagnosis not present

## 2019-08-16 DIAGNOSIS — E1122 Type 2 diabetes mellitus with diabetic chronic kidney disease: Secondary | ICD-10-CM | POA: Insufficient documentation

## 2019-08-16 DIAGNOSIS — T82838A Hemorrhage of vascular prosthetic devices, implants and grafts, initial encounter: Secondary | ICD-10-CM | POA: Insufficient documentation

## 2019-08-16 DIAGNOSIS — Z79899 Other long term (current) drug therapy: Secondary | ICD-10-CM | POA: Diagnosis not present

## 2019-08-16 DIAGNOSIS — Z794 Long term (current) use of insulin: Secondary | ICD-10-CM | POA: Insufficient documentation

## 2019-08-16 DIAGNOSIS — Z8673 Personal history of transient ischemic attack (TIA), and cerebral infarction without residual deficits: Secondary | ICD-10-CM | POA: Diagnosis not present

## 2019-08-16 NOTE — ED Triage Notes (Signed)
Pt bib [facility] from home w/ c/o bleeding fisutla after dialysis. Pt MWF dialysis pt, received complete tx today. Bleeding controlled w/ EMS. EMS VSS.

## 2019-08-16 NOTE — ED Notes (Signed)
No rebleeding noted at fistula site. Pt has no complaints at this time.

## 2019-08-16 NOTE — ED Provider Notes (Signed)
[facility] EMERGENCY DEPARTMENT Provider Note   CSN: [id number] Arrival date & time: 08/16/19  1731     History Chief Complaint  Patient presents with  . fistual bleed    Ashlee Miles is a 79 y.o. female w/ esrd on dialysis MWF presenting to Ed with fistula bleed  Finished dialysis today Left femoral fistula site Got home, changing clothes, and began bleeding from site, called EMS who applied pressure bandage to the wound By the time she arrived she feels bleeding has stopped  Says this has never happened before  No lightheadness, fevers, chills    HPI     Past Medical History:  Diagnosis Date  . CHF (congestive heart failure) ([place])   . Diabetes mellitus without complication ([place])   . ESRD (end stage renal disease) on dialysis ([place])   . History of pituitary adenoma   . Hypertension   . Protein C deficiency [facility])     Patient Active Problem List   Diagnosis Date Noted  . Dyspnea 02/12/2019  . Memory loss 03/25/2018  . Generalized weakness 10/31/2017  . Palliative care encounter 10/31/2017  . Leg wound, left 07/12/2017  . Cellulitis of left lower extremity 07/12/2017  . HLD (hyperlipidemia) 07/12/2017  . Chest pain in adult 06/22/2017  . Elevated troponin   . Altered mental status   . Diabetes mellitus with end stage renal disease ([place]) 05/31/2017  . Depression 05/31/2017  . GERD (gastroesophageal reflux disease) 05/31/2017  . Left groin pain 05/31/2017  . Protein C deficiency ([place])   . 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 Falls 04/25/2017  . Hypotension 04/25/2017  . Epigastric pain 04/25/2017  . Constipation 04/25/2017  . ESRD on dialysis ([place]) 04/06/2017  . Anemia due to chronic kidney disease 04/06/2017  . Thrombocytopenia ([place]) 04/06/2017  . Cough in adult 04/06/2017  . DVT (deep venous thrombosis) ([place]) 04/05/2017  . Pressure injury of skin 03/14/2017  . Acute metabolic encephalopathy [id number]  . Skin lesion of hand 03/12/2017   . TIA (transient ischemic attack) 03/01/2017  . Syncope 03/01/2017  . Diabetes mellitus with complication ([place]) [id number]  . Essential hypertension 03/01/2017  . Chronic diastolic CHF (congestive heart failure) ([place]) 03/01/2017    Past Surgical History:  Procedure Laterality Date  . AV FISTULA PLACEMENT Left 03/16/2017   Procedure: INSERTION OF ARTERIOVENOUS (AV) GORE-TEX GRAFT THIGH-LEFT;  Surgeon: Angelia Mould, MD;  Location: [place];  Service: Vascular;  Laterality: Left;  . CHOLECYSTECTOMY    . DIALYSIS FISTULA CREATION     clotted off  . FALSE ANEURYSM REPAIR Left 05/10/2017   Procedure: REPAIR FALSE ANEURYSM left thigh AVGG using Gore 69mmx5cm Viabahn Endoprosthesis;  Surgeon: Serafina Mitchell, MD;  Location: [facility] OR;  Service: Vascular;  Laterality: Left;  . I & D EXTREMITY Left 07/14/2017   Procedure: IRRIGATION AND DEBRIDEMENT DISTAL THIGH ABSCESS;  Surgeon: Angelia Mould, MD;  Location: [place];  Service: Vascular;  Laterality: Left;  . INSERTION OF DIALYSIS CATHETER Right 03/16/2017   Procedure: INSERTION OF RIGHT FEMORAL TUNNELED DIALYSIS CATHETER;  Surgeon: Angelia Mould, MD;  Location: [facility];  Service: Vascular;  Laterality: Right;  . IR AV DIALY SHUNT INTRO [place] W/PTA/IMG LEFT  11/16/2017  . IR DIALY SHUNT INTRO NEEDLE/INTRACATH INITIAL W/IMG LEFT Left 01/10/2019  . IR THROMBECTOMY AV FISTULA W/THROMBOLYSIS/PTA INC/SHUNT/IMG LEFT Left 06/22/2017  . IR THROMBECTOMY AV FISTULA W/THROMBOLYSIS/PTA/STENT INC/SHUNT/IMG LT Left 03/30/2018  . IR US GUIDE VASC ACCESS LEFT  06/22/2017  .  IR US GUIDE VASC ACCESS LEFT  03/30/2018  . IVC FILTER INSERTION    . PORTACATH PLACEMENT    . THROMBECTOMY AND REVISION OF ARTERIOVENTOUS (AV) GORETEX  GRAFT Left 06/02/2017   Procedure: EVACUATION OF LEFT THIGH HEMATOMA  AND APPLICATION OF WOUND VAC;  Surgeon: Serafina Mitchell, MD;  Location: [facility] OR;  Service: Vascular;  Laterality: Left;     OB History   No  obstetric history on file.     Family History  Problem Relation Age of Onset  . Breast cancer Mother   . Diabetes Sister   . Diabetes Brother   . CAD Other   . Stroke Neg Hx     Social History   Tobacco Use  . Smoking status: Never Smoker  . Smokeless tobacco: Never Used  Substance Use Topics  . Alcohol use: No  . Drug use: No    Home Medications Prior to Admission medications   Medication Sig Start Date End Date Taking? Authorizing Provider  albuterol (PROVENTIL) (2.5 MG/3ML) 0.083% nebulizer solution 51ml vial by nebulizer every 4 hours And As Needed for wheezing and Shortness of breath 12/19/18   Collene Gobble, MD  albuterol (PROVENTIL) (2.5 MG/3ML) 0.083% nebulizer solution Take 3 mLs (2.5 mg total) by nebulization every 4 (four) hours as needed for wheezing or shortness of breath. Diagnosis COPD J44.9 12/20/18   Collene Gobble, MD  albuterol (VENTOLIN HFA) 108 (90 Base) MCG/ACT inhaler albuterol sulfate HFA 90 mcg/actuation aerosol inhaler    [provider]  Amino Acids-Protein Hydrolys (FEEDING SUPPLEMENT, PRO-STAT SUGAR FREE 64,) LIQD Take 30 mLs by mouth 2 (two) times daily. 04/12/17   Sheikh, Omair Latif, DO  BD PEN NEEDLE NANO U/F 32G X 4 MM MISC USE AS DIRECTED FOR INSULIN ADMIN 3X A DAY 01/18/19   [provider]  benzonatate (TESSALON PERLES) 100 MG capsule Tessalon Perles 100 mg capsule  Take 1 capsule 3 times a day by oral route for 7 days.    [provider]  calcitRIOL (ROCALTROL) 0.5 MCG capsule Take 0.5 mcg by mouth daily.    [provider]  camphor-menthol Timoteo Ace) lotion camphor-menthol 0.5 %-0.5 % lotion  Sarna lotion, q 8hrs prn for itching    [provider]  carbamide peroxide (DEBROX) 6.5 % OTIC solution Debrox 6.5 % ear drops  INSTILL 5 DROPS INTO AFFECTED EAR(S) BY OTIC ROUTE 2 TIMES PER DAY    [provider]  carvedilol (COREG) 3.125 MG tablet  04/16/18   [provider]  cinacalcet  (SENSIPAR) 30 MG tablet TAKE 1 TABLET BY MOUTH THREE DAYS A WEEK (3 TIMES A WEEK) ON MONDAY, [place] 02/11/19   [provider]  clobetasol cream (TEMOVATE) 0.05 % APPLY THIN COAT TO AFFECTED AREA TWICE A DAY 11/04/18   [provider]  collagenase (SANTYL) ointment Apply topically 2 (two) times daily. Apply to L thigh wound BID. Wet-to-dry dressing on top Santyl BID 07/21/17   Dessa Phi, DO  docusate sodium (COLACE) 100 MG capsule Take 1 capsule (100 mg total) by mouth 2 (two) times daily. 04/12/17   Raiford Noble Latif, DO  famotidine (PEPCID) 20 MG tablet Take 1 tablet (20 mg total) daily by mouth. 04/28/17   Lavina Hamman, MD  gabapentin (NEURONTIN) 300 MG capsule TAKE 1 CAPSULE BY MOUTH EVERYDAY AT BEDTIME 01/19/19   [provider]  HYDROcodone-acetaminophen (NORCO) 7.5-325 MG tablet hydrocodone 7.5 mg-acetaminophen 325 mg tablet    [provider]  insulin aspart protamine- aspart (NOVOLOG MIX 70/30) (70-30) 100 UNIT/ML injection Inject 30-40 Units into the skin See admin instructions. 40 units in the morning and 30 units in the evening or Sliding Scale    [provider]  Insulin Lispro Prot & Lispro (HUMALOG MIX 75/25 KWIKPEN) (75-25) 100 UNIT/ML Kwikpen Humalog Mix 75-25 KwikPen U-100 insulin 100 unit/mL subcutaneous pen    [provider]  lidocaine-prilocaine (EMLA) cream APPLY TOPICALLY TO LEFT LEG GRAFT 1 HOUR BEFORE DIALYSIS AND COVER WITH PLASTIC WRAP 10/10/18   [provider]  midodrine (PROAMATINE) 10 MG tablet Take 1 tablet (10 mg total) by mouth every Monday, Wednesday, and Friday with hemodialysis. 04/14/17   Raiford Noble Latif, DO  multivitamin (RENA-VIT) TABS tablet Take 1 tablet by mouth at bedtime. 04/12/17   Raiford Noble Latif, DO  nystatin-triamcinolone (MYCOLOG II) cream nystatin-triamcinolone 100,000 unit/g-0.1 % topical cream  Apply 1 application topically every 12 (twelve) hours as needed.     [provider]  Oxycodone HCl 10 MG TABS Take 10 mg by mouth every 12 (twelve) hours. 01/18/19   [provider]  pantoprazole (PROTONIX) 40 MG tablet Take 1 tablet (40 mg total) 2 (two) times daily before a meal by mouth. 04/28/17   Lavina Hamman, MD  PARoxetine (PAXIL) 10 MG tablet Take 1 tablet (10 mg total) daily by mouth. 04/28/17   Lavina Hamman, MD  polyethylene glycol [place] / Floria Raveling) packet Take 17 g daily by mouth. 04/28/17   Lavina Hamman, MD  sevelamer carbonate (RENVELA) 800 MG tablet Take 800 mg by mouth 3 (three) times daily with meals.    [provider]  simvastatin (ZOCOR) 40 MG tablet Take 40 mg by mouth daily at 6 PM.    [provider]  sucralfate (CARAFATE) 1 GM/10ML suspension Take 10 mLs (1 g total) 4 (four) times daily -  with meals and at bedtime by mouth. 04/28/17   Lavina Hamman, MD  thiamine (VITAMIN B-1) 100 MG tablet Take 1 tablet (100 mg total) by mouth daily. 06/09/17   Dessa Phi, DO  triamcinolone cream (KENALOG) 0.1 % Apply 1 application topically every 12 (twelve) hours as needed.     [provider]  warfarin (COUMADIN) 5 MG tablet Take 5 mg by mouth daily at 6 PM.    [provider]    Allergies    Patient has no known allergies.  Review of Systems   Review of Systems  Constitutional: Negative for chills and fever.  Cardiovascular: Negative for chest pain and palpitations.  Skin: Positive for rash and wound.  Neurological: Negative for syncope and light-headedness.  All other systems reviewed and are negative.   Physical Exam Updated Vital Signs BP (!) 149/55   Pulse 70   Temp 98.2 F (36.8 C) (Oral)   Resp 16   SpO2 100%   Physical Exam Vitals and nursing note reviewed.  Constitutional:      General: She is not in acute distress.    Appearance: She is well-developed. She is obese.  HENT:     Head: Normocephalic and atraumatic.  Eyes:     Conjunctiva/sclera: Conjunctivae  normal.  Cardiovascular:     Rate and Rhythm: Normal rate and regular rhythm.     Pulses: Normal pulses.  Pulmonary:     Effort: Pulmonary effort is normal. No respiratory distress.  Musculoskeletal:     Cervical back: Neck supple.     Comments: Left femoral  fistula site with palpable thrill, no active bleeding after dressing removed  Skin:    General: Skin is warm and dry.  Neurological:     Mental Status: She is alert and oriented to person, place, and time.     ED Results / Procedures / Treatments   Labs (all labs ordered are listed, but only abnormal results are displayed) Labs Reviewed - No data to display  EKG None  Radiology No results found.  Procedures Procedures (including critical care time)  Medications Ordered in ED Medications - No data to display  ED Course  I have reviewed the triage vital signs and the nursing notes.  Pertinent labs & imaging results that were available during my care of the patient were reviewed by me and considered in my medical decision making (see chart for details).  79 yo female here with dialysis left femoral fistula bleeding after completion of session today  Bleeding has been staunched with pressure wrap by EMS, and she's not actively bleeding No lightheadedness, don't suspect this was a significant amount of blood loss She is on A/C, reasonable to monitor her here for a period of time prior to discharge back home   Clinical Course as of Aug 16 8  Fri Aug 16, 2019  1831 No active bleeding on initial assessment   [MT]  2050 No further bleeding after observation, will discharge   [MT]    Clinical Course User Index [MT] Roey Coopman, Carola Rhine, MD    Final Clinical Impression(s) / ED Diagnoses Final diagnoses:  Hemorrhage of hemodialysis arteriovenous fistula of left thigh, initial encounter [facility])    Rx / DC Orders ED Discharge Orders    None       Adreona Brand, Carola Rhine, MD 08/17/19 0010

## 2019-08-16 NOTE — Discharge Instructions (Addendum)
Please follow up for your next dialysis session as scheduled.  Your bleeding has stopped since you arrived in the Ed.  It is not uncommon for patients to have bleeding after dialysis.  If this happens again, hold firm pressure on the site for 10 minutes, and call 911.

## 2019-08-18 ENCOUNTER — Ambulatory Visit: Payer: 59 | Attending: Internal Medicine

## 2019-08-18 DIAGNOSIS — Z23 Encounter for immunization: Secondary | ICD-10-CM | POA: Insufficient documentation

## 2019-08-18 NOTE — Progress Notes (Signed)
   Covid-19 Vaccination Clinic  Name:  [name]    MRN: [id number] DOB: 1940/09/27  08/18/2019  [name] was observed post Covid-19 immunization for 15 minutes without incidence. She was provided with Vaccine Information Sheet and instruction to access the V-Safe system.   [name] was instructed to call 911 with any severe reactions post vaccine: Marland Kitchen Difficulty breathing  . Swelling of your face and throat  . A fast heartbeat  . A bad rash all over your body  . Dizziness and weakness    Immunizations Administered    Name Date Dose VIS Date Route   Pfizer COVID-19 Vaccine 08/18/2019 11:17 AM 0.3 mL 05/31/2019 Intramuscular   Manufacturer: [place]   Lot: [id number]   [place]: [id number]

## 2019-08-22 ENCOUNTER — Other Ambulatory Visit: Payer: Self-pay

## 2019-08-22 ENCOUNTER — Ambulatory Visit (HOSPITAL_COMMUNITY)
Admission: RE | Admit: 2019-08-22 | Discharge: 2019-08-22 | Disposition: A | Discharge status: Home / Self Care | Payer: 59 | Source: Ambulatory Visit | Attending: Internal Medicine | Admitting: Internal Medicine

## 2019-08-22 DIAGNOSIS — Z452 Encounter for adjustment and management of vascular access device: Secondary | ICD-10-CM | POA: Diagnosis present

## 2019-08-22 MED ORDER — HEPARIN SOD (PORK) LOCK FLUSH 100 UNIT/ML IV SOLN
500.0000 [IU] | INTRAVENOUS | Status: AC | PRN
  Administered 2019-08-22: 500 [IU]

## 2019-08-22 MED ORDER — SODIUM CHLORIDE 0.9% FLUSH
10.0000 mL | INTRAVENOUS | Status: AC | PRN
  Administered 2019-08-22: 10 mL

## 2019-08-22 NOTE — Progress Notes (Signed)
PATIENT CARE CENTER NOTE   Provider:Philip Asenso, MD   Procedure:Port-a-cath flush   Note:Patient's PAC assessed and flushed with Heparin and 0.9% Sodium Chloride. Sterile procedure followed. PAC deassessed andsitecovered with band-aid. Patient tolerated well. Alert, oriented and ambulatory to wheelchair at discharge.

## 2019-08-27 ENCOUNTER — Ambulatory Visit: Payer: 59 | Admitting: Podiatry

## 2019-09-04 ENCOUNTER — Other Ambulatory Visit: Payer: 59 | Admitting: Internal Medicine

## 2019-09-04 ENCOUNTER — Telehealth: Payer: Self-pay | Admitting: Internal Medicine

## 2019-09-04 ENCOUNTER — Other Ambulatory Visit: Payer: Self-pay

## 2019-09-04 DIAGNOSIS — Z515 Encounter for palliative care: Secondary | ICD-10-CM

## 2019-09-04 DIAGNOSIS — N186 End stage renal disease: Secondary | ICD-10-CM

## 2019-09-04 DIAGNOSIS — Z992 Dependence on renal dialysis: Secondary | ICD-10-CM

## 2019-09-04 NOTE — Telephone Encounter (Signed)
Returned phone call to daughter [name].  She states that patient has been refusing to go to dialysis and is asking permission to discontinue treatments.  She has been weaker and has been eating intermittently since recent episode of bleeding from her fistula.   Daughter would like to have a joint conversation with patient to discuss her goals and immediate future planning.  Appointment made for an in-person visit today at 3:00pm.  She thanked me for the call.  Gonzella Lex, NP-C

## 2019-09-06 ENCOUNTER — Telehealth: Payer: Self-pay

## 2019-09-06 NOTE — Telephone Encounter (Signed)
rec'vd referral from Dr. Buelah Manis for excessive bleeding per Ashlee Fairly, NP per Dr. Donzetta Matters pt can be scheduled for L thigh shuntogram - to SP to schedule

## 2019-09-09 NOTE — Telephone Encounter (Signed)
Ashlee Miles @ Dr. Sanda Klein office called to cancel shuntogram per Dr. Jamal Maes - pt don't need at this time

## 2019-09-17 ENCOUNTER — Ambulatory Visit: Payer: 59 | Attending: Internal Medicine

## 2019-09-17 DIAGNOSIS — Z23 Encounter for immunization: Secondary | ICD-10-CM

## 2019-09-17 NOTE — Progress Notes (Signed)
   Covid-19 Vaccination Clinic  Name:  Ashlee Miles    MRN: [id number] DOB: 1940-09-16  09/17/2019  [name] was observed post Covid-19 immunization for 15 minutes without incident. She was provided with Vaccine Information Sheet and instruction to access the V-Safe system.   [name] was instructed to call 911 with any severe reactions post vaccine: Marland Kitchen Difficulty breathing  . Swelling of face and throat  . A fast heartbeat  . A bad rash all over body  . Dizziness and weakness   Immunizations Administered    Name Date Dose VIS Date Route   Pfizer COVID-19 Vaccine 09/17/2019 12:47 PM 0.3 mL 05/31/2019 Intramuscular   Manufacturer: [place]   Lot: [id number]   [place]: [id number]

## 2019-09-26 ENCOUNTER — Encounter (HOSPITAL_COMMUNITY): Payer: 59

## 2019-10-03 ENCOUNTER — Encounter (HOSPITAL_COMMUNITY): Payer: 59

## 2019-10-10 ENCOUNTER — Other Ambulatory Visit: Payer: Self-pay

## 2019-10-10 ENCOUNTER — Ambulatory Visit (HOSPITAL_COMMUNITY)
Admission: RE | Admit: 2019-10-10 | Discharge: 2019-10-10 | Disposition: A | Discharge status: Home / Self Care | Payer: 59 | Source: Ambulatory Visit | Attending: Internal Medicine | Admitting: Internal Medicine

## 2019-10-10 DIAGNOSIS — Z452 Encounter for adjustment and management of vascular access device: Secondary | ICD-10-CM | POA: Diagnosis present

## 2019-10-10 MED ORDER — HEPARIN SOD (PORK) LOCK FLUSH 100 UNIT/ML IV SOLN
500.0000 [IU] | INTRAVENOUS | Status: AC | PRN
  Administered 2019-10-10: 13:00:00 500 [IU]

## 2019-10-10 MED ORDER — SODIUM CHLORIDE 0.9% FLUSH
10.0000 mL | INTRAVENOUS | Status: AC | PRN
  Administered 2019-10-10: 13:00:00 10 mL

## 2019-10-10 NOTE — Progress Notes (Signed)
Implanted port was flushed with 500 units of heparin and 10 cc of normal saline. Port accessed and de-accessed  per protocol. Patient tolerated well, alert, oriented and transported in a wheelchair.

## 2019-10-21 ENCOUNTER — Other Ambulatory Visit: Payer: Self-pay | Admitting: Emergency Medicine

## 2019-10-22 ENCOUNTER — Encounter: Payer: Self-pay | Admitting: Podiatry

## 2019-10-22 ENCOUNTER — Ambulatory Visit (INDEPENDENT_AMBULATORY_CARE_PROVIDER_SITE_OTHER): Payer: 59 | Admitting: Podiatry

## 2019-10-22 ENCOUNTER — Other Ambulatory Visit: Payer: Self-pay

## 2019-10-22 DIAGNOSIS — E1142 Type 2 diabetes mellitus with diabetic polyneuropathy: Secondary | ICD-10-CM | POA: Diagnosis not present

## 2019-10-22 DIAGNOSIS — B351 Tinea unguium: Secondary | ICD-10-CM

## 2019-10-22 DIAGNOSIS — M79675 Pain in left toe(s): Secondary | ICD-10-CM

## 2019-10-22 DIAGNOSIS — M79674 Pain in right toe(s): Secondary | ICD-10-CM | POA: Diagnosis not present

## 2019-10-22 DIAGNOSIS — D689 Coagulation defect, unspecified: Secondary | ICD-10-CM

## 2019-10-22 NOTE — Progress Notes (Signed)
This patient returns to my office for at risk foot care.  This patient requires this care by a professional since this patient will be at risk due to having ESRD and history of DVT and diabetes.  This patient is unable to cut nails herself since the patient cannot reach her nails.These nails are painful walking and wearing shoes.  This patient presents for at risk foot care today.  General Appearance  Alert, conversant and in no acute stress.  Vascular  Dorsalis pedis and posterior tibial  pulses are weakly  palpable  bilaterally.  Capillary return is within normal limits  bilaterally. Temperature is within normal limits  bilaterally.  Neurologic  Senn-Weinstein monofilament wire test within normal limits  bilaterally. Muscle power within normal limits bilaterally.  Nails Thick disfigured discolored nails with subungual debris  from hallux to fifth toes bilaterally. No evidence of bacterial infection or drainage bilaterally.  Orthopedic  No limitations of motion  feet .  No crepitus or effusions noted.  No bony pathology or digital deformities noted.  Skin  normotropic skin with no porokeratosis noted bilaterally.  No signs of infections or ulcers noted.     Onychomycosis  Pain in right toes  Pain in left toes  Consent was obtained for treatment procedures.   Mechanical debridement of nails 1-5  bilaterally performed with a nail nipper.  Filed with dremel without incident.    Return office visit   3 months                  Told patient to return for periodic foot care and evaluation due to potential at risk complications.   Gardiner Barefoot DPM

## 2019-11-07 ENCOUNTER — Encounter (HOSPITAL_COMMUNITY): Payer: 59

## 2019-11-14 ENCOUNTER — Encounter (HOSPITAL_COMMUNITY): Payer: 59

## 2019-11-28 ENCOUNTER — Ambulatory Visit (HOSPITAL_COMMUNITY)
Admission: RE | Admit: 2019-11-28 | Discharge: 2019-11-28 | Disposition: A | Discharge status: Home / Self Care | Payer: 59 | Source: Ambulatory Visit | Attending: Internal Medicine | Admitting: Internal Medicine

## 2019-11-28 ENCOUNTER — Other Ambulatory Visit: Payer: Self-pay

## 2019-11-28 DIAGNOSIS — Z452 Encounter for adjustment and management of vascular access device: Secondary | ICD-10-CM | POA: Diagnosis not present

## 2019-11-28 MED ORDER — SODIUM CHLORIDE 0.9% FLUSH
10.0000 mL | INTRAVENOUS | Status: AC | PRN
  Administered 2019-11-28: 10 mL

## 2019-11-28 MED ORDER — HEPARIN SOD (PORK) LOCK FLUSH 100 UNIT/ML IV SOLN
500.0000 [IU] | INTRAVENOUS | Status: AC | PRN
  Administered 2019-11-28: 500 [IU]
  Filled 2019-11-28: qty 5

## 2019-11-28 NOTE — Progress Notes (Signed)
Implanted port was flushed with 500 units of heparin and 10 cc of normal saline as ordered by [name] MD. Port accessed and de-accessed  per protocol. Patient tolerated well, alert, oriented and transported in a wheelchair.

## 2019-12-30 ENCOUNTER — Telehealth: Payer: Self-pay

## 2019-12-30 NOTE — Telephone Encounter (Signed)
11/20/19: Palliative volunteer check in call attempt, no answer 

## 2019-12-31 ENCOUNTER — Encounter (HOSPITAL_COMMUNITY): Payer: 59

## 2020-01-02 ENCOUNTER — Encounter (HOSPITAL_COMMUNITY): Payer: 59

## 2020-01-14 ENCOUNTER — Ambulatory Visit (HOSPITAL_COMMUNITY)
Admission: RE | Admit: 2020-01-14 | Discharge: 2020-01-14 | Disposition: A | Discharge status: Home / Self Care | Payer: 59 | Source: Ambulatory Visit | Attending: Internal Medicine | Admitting: Internal Medicine

## 2020-01-14 ENCOUNTER — Other Ambulatory Visit: Payer: Self-pay

## 2020-01-14 DIAGNOSIS — Z452 Encounter for adjustment and management of vascular access device: Secondary | ICD-10-CM | POA: Insufficient documentation

## 2020-01-14 MED ORDER — SODIUM CHLORIDE 0.9% FLUSH
10.0000 mL | INTRAVENOUS | Status: AC | PRN
  Administered 2020-01-14: 10 mL

## 2020-01-14 MED ORDER — HEPARIN SOD (PORK) LOCK FLUSH 100 UNIT/ML IV SOLN
500.0000 [IU] | INTRAVENOUS | Status: AC | PRN
  Administered 2020-01-14: 500 [IU]

## 2020-01-14 NOTE — Discharge Instructions (Signed)
Implanted port-a-cath flush

## 2020-01-14 NOTE — Progress Notes (Signed)
Implanted port was flushed with 500 units of heparin and 10 cc of normal saline as ordered by [name] MD. Port accessed and de-accessed per protocol. Patient tolerated well, alert, oriented and transported in a wheelchair.

## 2020-01-16 ENCOUNTER — Other Ambulatory Visit: Payer: Self-pay | Admitting: Anesthesiology

## 2020-01-16 DIAGNOSIS — M5136 Other intervertebral disc degeneration, lumbar region: Secondary | ICD-10-CM

## 2020-01-16 DIAGNOSIS — M51369 Other intervertebral disc degeneration, lumbar region without mention of lumbar back pain or lower extremity pain: Secondary | ICD-10-CM

## 2020-01-16 DIAGNOSIS — M545 Low back pain, unspecified: Secondary | ICD-10-CM

## 2020-01-21 ENCOUNTER — Encounter: Payer: Self-pay | Admitting: Podiatry

## 2020-01-21 ENCOUNTER — Ambulatory Visit (INDEPENDENT_AMBULATORY_CARE_PROVIDER_SITE_OTHER): Payer: 59 | Admitting: Podiatry

## 2020-01-21 ENCOUNTER — Other Ambulatory Visit: Payer: Self-pay

## 2020-01-21 DIAGNOSIS — B351 Tinea unguium: Secondary | ICD-10-CM

## 2020-01-21 DIAGNOSIS — D689 Coagulation defect, unspecified: Secondary | ICD-10-CM | POA: Diagnosis not present

## 2020-01-21 DIAGNOSIS — M79675 Pain in left toe(s): Secondary | ICD-10-CM | POA: Diagnosis not present

## 2020-01-21 DIAGNOSIS — E1142 Type 2 diabetes mellitus with diabetic polyneuropathy: Secondary | ICD-10-CM

## 2020-01-21 DIAGNOSIS — M79674 Pain in right toe(s): Secondary | ICD-10-CM

## 2020-01-21 NOTE — Progress Notes (Signed)
This patient returns to my office for at risk foot care.  This patient requires this care by a professional since this patient will be at risk due to having ESRD and history of DVT and diabetes.  This patient is unable to cut nails herself since the patient cannot reach her nails.These nails are painful walking and wearing shoes.  This patient presents for at risk foot care today.  General Appearance  Alert, conversant and in no acute stress.  Vascular  Dorsalis pedis and posterior tibial  pulses are weakly  palpable  bilaterally.  Capillary return is within normal limits  bilaterally. Temperature is within normal limits  bilaterally.  Neurologic  Senn-Weinstein monofilament wire test within normal limits  bilaterally. Muscle power within normal limits bilaterally.  Nails Thick disfigured discolored nails with subungual debris  from hallux to fifth toes bilaterally. No evidence of bacterial infection or drainage bilaterally.  Orthopedic  No limitations of motion  feet .  No crepitus or effusions noted.  No bony pathology or digital deformities noted.  Skin  normotropic skin with no porokeratosis noted bilaterally.  No signs of infections or ulcers noted.     Onychomycosis  Pain in right toes  Pain in left toes  Consent was obtained for treatment procedures.   Mechanical debridement of nails 1-5  bilaterally performed with a nail nipper.  Filed with dremel without incident.    Return office visit   3 months                  Told patient to return for periodic foot care and evaluation due to potential at risk complications.   Gardiner Barefoot DPM

## 2020-02-14 ENCOUNTER — Encounter (HOSPITAL_COMMUNITY): Payer: 59

## 2020-02-18 ENCOUNTER — Other Ambulatory Visit: Payer: 59

## 2020-03-08 ENCOUNTER — Ambulatory Visit
Admission: RE | Admit: 2020-03-08 | Discharge: 2020-03-08 | Disposition: A | Discharge status: Home / Self Care | Payer: 59 | Source: Ambulatory Visit | Attending: Anesthesiology | Admitting: Anesthesiology

## 2020-03-08 ENCOUNTER — Other Ambulatory Visit: Payer: Self-pay

## 2020-03-08 DIAGNOSIS — M545 Low back pain, unspecified: Secondary | ICD-10-CM

## 2020-03-08 DIAGNOSIS — M5136 Other intervertebral disc degeneration, lumbar region: Secondary | ICD-10-CM

## 2020-03-10 ENCOUNTER — Other Ambulatory Visit: Payer: Self-pay | Admitting: Internal Medicine

## 2020-03-10 DIAGNOSIS — Z1231 Encounter for screening mammogram for malignant neoplasm of breast: Secondary | ICD-10-CM

## 2020-03-12 ENCOUNTER — Other Ambulatory Visit: Payer: Self-pay

## 2020-03-12 ENCOUNTER — Ambulatory Visit (HOSPITAL_COMMUNITY)
Admission: RE | Admit: 2020-03-12 | Discharge: 2020-03-12 | Disposition: A | Discharge status: Home / Self Care | Payer: 59 | Source: Ambulatory Visit | Attending: Internal Medicine | Admitting: Internal Medicine

## 2020-03-12 DIAGNOSIS — Z452 Encounter for adjustment and management of vascular access device: Secondary | ICD-10-CM | POA: Insufficient documentation

## 2020-03-12 MED ORDER — SODIUM CHLORIDE 0.9% FLUSH
10.0000 mL | INTRAVENOUS | Status: AC | PRN
  Administered 2020-03-12: 10 mL

## 2020-03-12 MED ORDER — HEPARIN SOD (PORK) LOCK FLUSH 100 UNIT/ML IV SOLN
500.0000 [IU] | INTRAVENOUS | Status: AC | PRN
  Administered 2020-03-12: 500 [IU]
  Filled 2020-03-12: qty 5

## 2020-03-12 NOTE — Progress Notes (Signed)
PATIENT CARE CENTER NOTE   Provider:Philip Asenso, MD   Procedure:Port-a-cath flush   Note:Patient's PAC assessed and flushed with Heparin and 0.9% Sodium Chloride. Sterile procedure followed. PAC deassessed andsitecovered with band-aid. Patient tolerated well. Alert, oriented and ambulatory to wheelchair at discharge.

## 2020-03-18 ENCOUNTER — Emergency Department (HOSPITAL_COMMUNITY)
Admission: EM | Admit: 2020-03-18 | Discharge: 2020-03-19 | Disposition: A | Discharge status: Home / Self Care | Payer: 59 | Source: Home / Self Care | Attending: Emergency Medicine | Admitting: Emergency Medicine

## 2020-03-18 ENCOUNTER — Encounter (HOSPITAL_COMMUNITY): Payer: Self-pay

## 2020-03-18 DIAGNOSIS — Z794 Long term (current) use of insulin: Secondary | ICD-10-CM | POA: Insufficient documentation

## 2020-03-18 DIAGNOSIS — R1010 Upper abdominal pain, unspecified: Secondary | ICD-10-CM

## 2020-03-18 DIAGNOSIS — Z79899 Other long term (current) drug therapy: Secondary | ICD-10-CM | POA: Insufficient documentation

## 2020-03-18 DIAGNOSIS — E11649 Type 2 diabetes mellitus with hypoglycemia without coma: Secondary | ICD-10-CM | POA: Diagnosis not present

## 2020-03-18 DIAGNOSIS — E1122 Type 2 diabetes mellitus with diabetic chronic kidney disease: Secondary | ICD-10-CM | POA: Insufficient documentation

## 2020-03-18 DIAGNOSIS — Z8673 Personal history of transient ischemic attack (TIA), and cerebral infarction without residual deficits: Secondary | ICD-10-CM | POA: Insufficient documentation

## 2020-03-18 DIAGNOSIS — R109 Unspecified abdominal pain: Secondary | ICD-10-CM | POA: Diagnosis not present

## 2020-03-18 DIAGNOSIS — I5032 Chronic diastolic (congestive) heart failure: Secondary | ICD-10-CM | POA: Insufficient documentation

## 2020-03-18 DIAGNOSIS — I132 Hypertensive heart and chronic kidney disease with heart failure and with stage 5 chronic kidney disease, or end stage renal disease: Secondary | ICD-10-CM | POA: Insufficient documentation

## 2020-03-18 DIAGNOSIS — N186 End stage renal disease: Secondary | ICD-10-CM | POA: Insufficient documentation

## 2020-03-18 DIAGNOSIS — Z992 Dependence on renal dialysis: Secondary | ICD-10-CM | POA: Insufficient documentation

## 2020-03-18 NOTE — ED Triage Notes (Signed)
Patient arrived by [facility] from dialysis complaining of abdominal pain radiating up right side with nausea. Denies vomiting and diarrhea. States she was unable to complete dialysis due to the pain

## 2020-03-19 ENCOUNTER — Emergency Department (HOSPITAL_COMMUNITY): Payer: 59

## 2020-03-19 LAB — COMPREHENSIVE METABOLIC PANEL
ALT: 8 U/L (ref 0–44)
AST: 14 U/L — ABNORMAL LOW (ref 15–41)
Albumin: 2.9 g/dL — ABNORMAL LOW (ref 3.5–5.0)
Alkaline Phosphatase: 48 U/L (ref 38–126)
Anion gap: 14 (ref 5–15)
BUN: 31 mg/dL — ABNORMAL HIGH (ref 8–23)
CO2: 27 mmol/L (ref 22–32)
Calcium: 9 mg/dL (ref 8.9–10.3)
Chloride: 95 mmol/L — ABNORMAL LOW (ref 98–111)
Creatinine, Ser: 7.86 mg/dL — ABNORMAL HIGH (ref 0.44–1.00)
GFR calc Af Amer: 5 mL/min — ABNORMAL LOW (ref >60)
GFR calc non Af Amer: 4 mL/min — ABNORMAL LOW (ref >60)
Glucose, Bld: 99 mg/dL (ref 70–99)
Potassium: 4.4 mmol/L (ref 3.5–5.1)
Sodium: 136 mmol/L (ref 135–145)
Total Bilirubin: 0.8 mg/dL (ref 0.3–1.2)
Total Protein: 6.8 g/dL (ref 6.5–8.1)

## 2020-03-19 LAB — TROPONIN I (HIGH SENSITIVITY): Troponin I (High Sensitivity): 19 ng/L — ABNORMAL HIGH (ref <18)

## 2020-03-19 LAB — URINALYSIS, ROUTINE W REFLEX MICROSCOPIC
Bilirubin Urine: NEGATIVE
Glucose, UA: NEGATIVE mg/dL
Ketones, ur: NEGATIVE mg/dL
Nitrite: NEGATIVE
Protein, ur: 100 mg/dL — AB
Specific Gravity, Urine: 1.015 (ref 1.005–1.030)
pH: 7 (ref 5.0–8.0)

## 2020-03-19 LAB — CBC WITH DIFFERENTIAL/PLATELET
Abs Immature Granulocytes: 0.03 10*3/uL (ref 0.00–0.07)
Basophils Absolute: 0 10*3/uL (ref 0.0–0.1)
Basophils Relative: 0 %
Eosinophils Absolute: 0.1 10*3/uL (ref 0.0–0.5)
Eosinophils Relative: 2 %
HCT: 31 % — ABNORMAL LOW (ref 36.0–46.0)
Hemoglobin: 9.8 g/dL — ABNORMAL LOW (ref 12.0–15.0)
Immature Granulocytes: 0 %
Lymphocytes Relative: 19 %
Lymphs Abs: 1.5 10*3/uL (ref 0.7–4.0)
MCH: 26.2 pg (ref 26.0–34.0)
MCHC: 31.6 g/dL (ref 30.0–36.0)
MCV: 82.9 fL (ref 80.0–100.0)
Monocytes Absolute: 0.9 10*3/uL (ref 0.1–1.0)
Monocytes Relative: 11 %
Neutro Abs: 5.3 10*3/uL (ref 1.7–7.7)
Neutrophils Relative %: 68 %
Platelets: 188 10*3/uL (ref 150–400)
RBC: 3.74 MIL/uL — ABNORMAL LOW (ref 3.87–5.11)
RDW: 15.4 % (ref 11.5–15.5)
WBC: 7.9 10*3/uL (ref 4.0–10.5)
nRBC: 0 % (ref 0.0–0.2)

## 2020-03-19 LAB — URINALYSIS, MICROSCOPIC (REFLEX)

## 2020-03-19 LAB — LIPASE, BLOOD: Lipase: 22 U/L (ref 11–51)

## 2020-03-19 MED ORDER — IOHEXOL 300 MG/ML  SOLN
100.0000 mL | Freq: Once | INTRAMUSCULAR | Status: AC | PRN
  Administered 2020-03-19: 100 mL via INTRAVENOUS

## 2020-03-19 MED ORDER — POLYETHYLENE GLYCOL 3350 17 G PO PACK: 17.0000 g | PACK | Freq: Every day | ORAL | 0 refills | Status: DC

## 2020-03-19 MED ORDER — CHLORHEXIDINE GLUCONATE CLOTH 2 % EX PADS: 6.0000 | MEDICATED_PAD | Freq: Every day | CUTANEOUS | Status: DC

## 2020-03-19 MED ORDER — HYDROMORPHONE HCL 1 MG/ML IJ SOLN
1.0000 mg | Freq: Once | INTRAMUSCULAR | Status: DC
  Filled 2020-03-19: qty 1

## 2020-03-19 MED ORDER — PANTOPRAZOLE SODIUM 40 MG IV SOLR
40.0000 mg | Freq: Once | INTRAVENOUS | Status: AC
  Administered 2020-03-19: 40 mg via INTRAVENOUS
  Filled 2020-03-19: qty 40

## 2020-03-19 MED ORDER — HEPARIN SOD (PORK) LOCK FLUSH 100 UNIT/ML IV SOLN
500.0000 [IU] | INTRAVENOUS | Status: AC | PRN
  Administered 2020-03-19: 500 [IU]
  Filled 2020-03-19: qty 5

## 2020-03-19 MED ORDER — HYDROCODONE-ACETAMINOPHEN 5-325 MG PO TABS: 1.0000 | ORAL_TABLET | Freq: Four times a day (QID) | ORAL | 0 refills | Status: DC | PRN

## 2020-03-19 MED ORDER — HYDROMORPHONE HCL 1 MG/ML IJ SOLN
0.5000 mg | Freq: Once | INTRAMUSCULAR | Status: AC
  Administered 2020-03-19: 0.5 mg via INTRAVENOUS

## 2020-03-19 MED ORDER — SODIUM CHLORIDE 0.9% FLUSH
10.0000 mL | INTRAVENOUS | Status: DC | PRN
  Administered 2020-03-19: 10 mL

## 2020-03-19 MED ORDER — SODIUM CHLORIDE 0.9% FLUSH
10.0000 mL | Freq: Two times a day (BID) | INTRAVENOUS | Status: DC
  Administered 2020-03-19: 10 mL

## 2020-03-19 NOTE — Discharge Instructions (Addendum)
Make sure you go to dialysis tomorrow.  Follow-up with your family doctor or dialysis doctor next week

## 2020-03-19 NOTE — ED Provider Notes (Signed)
[facility] EMERGENCY DEPARTMENT Provider Note   CSN: [id number] Arrival date & time: 03/18/20  1508     History No chief complaint on file.   Ashlee Miles is a 79 y.o. female.  Patient complains of epigastric abdominal pain.  Patient is a dialysis patient and she has had her gallbladder taken out  The history is provided by the patient and medical records. No language interpreter was used.  Abdominal Pain Pain location:  Epigastric Pain quality: aching   Pain severity:  Moderate Onset quality:  Sudden Timing:  Constant Progression:  Waxing and waning Chronicity:  New Context: not alcohol use   Associated symptoms: no chest pain, no cough, no diarrhea, no fatigue and no hematuria        Past Medical History:  Diagnosis Date  . CHF (congestive heart failure) ([place])   . Diabetes mellitus without complication ([place])   . ESRD (end stage renal disease) on dialysis ([place])   . History of pituitary adenoma   . Hypertension   . Protein C deficiency [facility])     Patient Active Problem List   Diagnosis Date Noted  . Dyspnea 02/12/2019  . Memory loss 03/25/2018  . Generalized weakness 10/31/2017  . Palliative care encounter 10/31/2017  . Leg wound, left 07/12/2017  . Cellulitis of left lower extremity 07/12/2017  . HLD (hyperlipidemia) 07/12/2017  . Chest pain in adult 06/22/2017  . Elevated troponin   . Altered mental status   . Diabetes mellitus with end stage renal disease ([place]) 05/31/2017  . Depression 05/31/2017  . GERD (gastroesophageal reflux disease) 05/31/2017  . Left groin pain 05/31/2017  . Protein C deficiency ([place])   . 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 Falls 04/25/2017  . Hypotension 04/25/2017  . Epigastric pain 04/25/2017  . Constipation 04/25/2017  . ESRD on dialysis ([place]) 04/06/2017  . Anemia due to chronic kidney disease 04/06/2017  . Thrombocytopenia ([place]) 04/06/2017  . Cough in adult 04/06/2017  . DVT (deep venous thrombosis) ([place])  04/05/2017  . Pressure injury of skin 03/14/2017  . Acute metabolic encephalopathy 40/97/3532  . Skin lesion of hand 03/12/2017  . TIA (transient ischemic attack) 03/01/2017  . Syncope 03/01/2017  . Diabetes mellitus with complication ([place]) 99/24/2683  . Essential hypertension 03/01/2017  . Chronic diastolic CHF (congestive heart failure) ([place]) 03/01/2017    Past Surgical History:  Procedure Laterality Date  . AV FISTULA PLACEMENT Left 03/16/2017   Procedure: INSERTION OF ARTERIOVENOUS (AV) GORE-TEX GRAFT THIGH-LEFT;  Surgeon: Angelia Mould, MD;  Location: [facility];  Service: Vascular;  Laterality: Left;  . CHOLECYSTECTOMY    . DIALYSIS FISTULA CREATION     clotted off  . FALSE ANEURYSM REPAIR Left 05/10/2017   Procedure: REPAIR FALSE ANEURYSM left thigh AVGG using Gore 48mmx5cm Viabahn Endoprosthesis;  Surgeon: Serafina Mitchell, MD;  Location: [facility] OR;  Service: Vascular;  Laterality: Left;  . I & D EXTREMITY Left 07/14/2017   Procedure: IRRIGATION AND DEBRIDEMENT DISTAL THIGH ABSCESS;  Surgeon: Angelia Mould, MD;  Location: [facility];  Service: Vascular;  Laterality: Left;  . INSERTION OF DIALYSIS CATHETER Right 03/16/2017   Procedure: INSERTION OF RIGHT FEMORAL TUNNELED DIALYSIS CATHETER;  Surgeon: Angelia Mould, MD;  Location: [facility];  Service: Vascular;  Laterality: Right;  . IR AV DIALY SHUNT INTRO [place] W/PTA/IMG LEFT  11/16/2017  . IR DIALY SHUNT INTRO NEEDLE/INTRACATH INITIAL W/IMG LEFT Left 01/10/2019  . IR THROMBECTOMY AV FISTULA W/THROMBOLYSIS/PTA INC/SHUNT/IMG LEFT Left 06/22/2017  .  IR THROMBECTOMY AV FISTULA W/THROMBOLYSIS/PTA/STENT INC/SHUNT/IMG LT Left 03/30/2018  . IR US GUIDE VASC ACCESS LEFT  06/22/2017  . IR US GUIDE VASC ACCESS LEFT  03/30/2018  . IVC FILTER INSERTION    . PORTACATH PLACEMENT    . THROMBECTOMY AND REVISION OF ARTERIOVENTOUS (AV) GORETEX  GRAFT Left 06/02/2017   Procedure: EVACUATION OF LEFT THIGH HEMATOMA  AND  APPLICATION OF WOUND VAC;  Surgeon: Serafina Mitchell, MD;  Location: [facility] OR;  Service: Vascular;  Laterality: Left;     OB History   No obstetric history on file.     Family History  Problem Relation Age of Onset  . Breast cancer Mother   . Diabetes Sister   . Diabetes Brother   . CAD Other   . Stroke Neg Hx     Social History   Tobacco Use  . Smoking status: Never Smoker  . Smokeless tobacco: Never Used  Vaping Use  . Vaping Use: Never used  Substance Use Topics  . Alcohol use: No  . Drug use: No    Home Medications Prior to Admission medications   Medication Sig Start Date End Date Taking? Authorizing Provider  albuterol (PROVENTIL) (2.5 MG/3ML) 0.083% nebulizer solution Take 3 mLs (2.5 mg total) by nebulization every 4 (four) hours as needed for wheezing or shortness of breath. Diagnosis COPD J44.9 12/20/18  Yes Byrum, Rose Fillers, MD  albuterol (VENTOLIN HFA) 108 (90 Base) MCG/ACT inhaler Inhale 2 puffs into the lungs every 4 (four) hours as needed for wheezing or shortness of breath.    Yes [provider]  carvedilol (COREG) 3.125 MG tablet Take 3.125 mg by mouth 2 (two) times daily with a meal.  04/16/18  Yes [provider]  cinacalcet (SENSIPAR) 30 MG tablet Take 30 mg by mouth 3 (three) times a week.  02/11/19  Yes [provider]  docusate sodium (COLACE) 100 MG capsule Take 1 capsule (100 mg total) by mouth 2 (two) times daily. 04/12/17  Yes Sheikh, Omair Latif, DO  famotidine (PEPCID) 20 MG tablet Take 1 tablet (20 mg total) daily by mouth. 04/28/17  Yes Lavina Hamman, MD  gabapentin (NEURONTIN) 100 MG capsule Take 100 mg by mouth at bedtime.  01/19/19  Yes [provider]  insulin aspart protamine- aspart (NOVOLOG MIX 70/30) (70-30) 100 UNIT/ML injection Inject 30-40 Units into the skin See admin instructions. 40 units in the morning and 30 units in the evening or Sliding Scale   Yes [provider]  lidocaine-prilocaine  (EMLA) cream Apply 1 application topically as directed.  10/10/18  Yes [provider]  midodrine (PROAMATINE) 10 MG tablet Take 1 tablet (10 mg total) by mouth every Monday, Wednesday, and Friday with hemodialysis. 04/14/17  Yes Sheikh, Omair Latif, DO  multivitamin (RENA-VIT) TABS tablet Take 1 tablet by mouth at bedtime. 04/12/17  Yes Sheikh, Omair Latif, DO  Oxycodone HCl 10 MG TABS Take 10 mg by mouth every 6 (six) hours as needed (pain).  01/18/19  Yes [provider]  PARoxetine (PAXIL) 10 MG tablet Take 1 tablet (10 mg total) daily by mouth. 04/28/17  Yes Lavina Hamman, MD  sevelamer carbonate (RENVELA) 800 MG tablet Take 800 mg by mouth 3 (three) times daily with meals.   Yes [provider]  simvastatin (ZOCOR) 40 MG tablet Take 40 mg by mouth daily at 6 PM.   Yes [provider]  warfarin (COUMADIN) 5 MG tablet Take 5 mg by mouth daily.  Yes [provider]  ZTLIDO 1.8 % PTCH Place 1 patch onto the skin every other day. 02/28/20  Yes [provider]  albuterol (PROVENTIL) (2.5 MG/3ML) 0.083% nebulizer solution 49ml vial by nebulizer every 4 hours And As Needed for wheezing and Shortness of breath Patient not taking: Reported on 03/19/2020 12/19/18   Collene Gobble, MD  albuterol (PROVENTIL) (2.5 MG/3ML) 0.083% nebulizer solution USE 1 VIAL IN NEBULIZER EVERY 4 HOURS - and as needed Patient not taking: Reported on 03/19/2020 10/21/19   Collene Gobble, MD  Amino Acids-Protein Hydrolys (FEEDING SUPPLEMENT, PRO-STAT SUGAR FREE 64,) LIQD Take 30 mLs by mouth 2 (two) times daily. Patient not taking: Reported on 03/19/2020 04/12/17   Raiford Noble Latif, DO  BD PEN NEEDLE NANO U/F 32G X 4 MM MISC USE AS DIRECTED FOR INSULIN ADMIN 3X A DAY 01/18/19   [provider]  calcitRIOL (ROCALTROL) 0.5 MCG capsule Take 0.5 mcg by mouth daily.    [provider]  collagenase (SANTYL) ointment Apply topically 2 (two) times daily. Apply to L  thigh wound BID. Wet-to-dry dressing on top Santyl BID Patient not taking: Reported on 03/19/2020 07/21/17   Dessa Phi, DO  HYDROcodone-acetaminophen (NORCO/VICODIN) 5-325 MG tablet Take 1 tablet by mouth every 6 (six) hours as needed for moderate pain. 03/19/20   Milton Ferguson, MD  pantoprazole (PROTONIX) 40 MG tablet Take 1 tablet (40 mg total) 2 (two) times daily before a meal by mouth. Patient not taking: Reported on 03/19/2020 04/28/17   Lavina Hamman, MD  polyethylene glycol (MIRALAX / GLYCOLAX) 17 g packet Take 17 g by mouth daily. 03/19/20   Milton Ferguson, MD  sucralfate (CARAFATE) 1 GM/10ML suspension Take 10 mLs (1 g total) 4 (four) times daily -  with meals and at bedtime by mouth. Patient not taking: Reported on 03/19/2020 04/28/17   Lavina Hamman, MD  thiamine (VITAMIN B-1) 100 MG tablet Take 1 tablet (100 mg total) by mouth daily. Patient not taking: Reported on 03/19/2020 06/09/17   Dessa Phi, DO    Allergies    Patient has no known allergies.  Review of Systems   Review of Systems  Constitutional: Negative for appetite change and fatigue.  HENT: Negative for congestion, ear discharge and sinus pressure.   Eyes: Negative for discharge.  Respiratory: Negative for cough.   Cardiovascular: Negative for chest pain.  Gastrointestinal: Positive for abdominal pain. Negative for diarrhea.  Genitourinary: Negative for frequency and hematuria.  Musculoskeletal: Negative for back pain.  Skin: Negative for rash.  Neurological: Negative for seizures and headaches.  Psychiatric/Behavioral: Negative for hallucinations.    Physical Exam Updated Vital Signs BP (!) 145/51   Pulse 71   Temp 98.4 F (36.9 C) (Oral)   Resp 13   Ht 5\' 3"  (1.6 m)   Wt 91.6 kg   SpO2 100%   BMI 35.78 kg/m   Physical Exam Vitals and nursing note reviewed.  Constitutional:      Appearance: She is well-developed.  HENT:     Head: Normocephalic.     Nose: Nose normal.  Eyes:     General:  No scleral icterus.    Conjunctiva/sclera: Conjunctivae normal.  Neck:     Thyroid: No thyromegaly.  Cardiovascular:     Rate and Rhythm: Normal rate and regular rhythm.     Heart sounds: No murmur heard.  No friction rub. No gallop.   Pulmonary:     Breath sounds: No stridor. No wheezing  or rales.  Chest:     Chest wall: No tenderness.  Abdominal:     General: There is no distension.     Tenderness: There is abdominal tenderness. There is no rebound.  Musculoskeletal:        General: Normal range of motion.     Cervical back: Neck supple.  Lymphadenopathy:     Cervical: No cervical adenopathy.  Skin:    Findings: No erythema or rash.  Neurological:     Mental Status: She is alert and oriented to person, place, and time.     Motor: No abnormal muscle tone.     Coordination: Coordination normal.  Psychiatric:        Behavior: Behavior normal.     ED Results / Procedures / Treatments   Labs (all labs ordered are listed, but only abnormal results are displayed) Labs Reviewed  URINALYSIS, ROUTINE W REFLEX MICROSCOPIC - Abnormal; Notable for the following components:      Result Value   APPearance CLOUDY (*)    Hgb urine dipstick SMALL (*)    Protein, ur 100 (*)    Leukocytes,Ua TRACE (*)    All other components within normal limits  URINALYSIS, MICROSCOPIC (REFLEX) - Abnormal; Notable for the following components:   Bacteria, UA MANY (*)    All other components within normal limits  CBC WITH DIFFERENTIAL/PLATELET - Abnormal; Notable for the following components:   RBC 3.74 (*)    Hemoglobin 9.8 (*)    HCT 31.0 (*)    All other components within normal limits  COMPREHENSIVE METABOLIC PANEL - Abnormal; Notable for the following components:   Chloride 95 (*)    BUN 31 (*)    Creatinine, Ser 7.86 (*)    Albumin 2.9 (*)    AST 14 (*)    GFR calc non Af Amer 4 (*)    GFR calc Af Amer 5 (*)    All other components within normal limits  TROPONIN I (HIGH SENSITIVITY) -  Abnormal; Notable for the following components:   Troponin I (High Sensitivity) 19 (*)    All other components within normal limits  URINE CULTURE  LIPASE, BLOOD  TROPONIN I (HIGH SENSITIVITY)    EKG EKG Interpretation  Date/Time:  Wednesday March 18 2020 15:16:36 EDT Ventricular Rate:  70 PR Interval:  202 QRS Duration: 60 QT Interval:  354 QTC Calculation: 382 R Axis:   12 Text Interpretation: Sinus rhythm with marked sinus arrhythmia Anterior infarct , age undetermined Abnormal ECG Confirmed by Merrily Pew [phone]) on 03/19/2020 2:31:16 AM   Radiology CT ABDOMEN PELVIS W CONTRAST  Result Date: 03/19/2020 CLINICAL DATA:  Acute abdominal pain EXAM: CT ABDOMEN AND PELVIS WITH CONTRAST TECHNIQUE: Multidetector CT imaging of the abdomen and pelvis was performed using the standard protocol following bolus administration of intravenous contrast. CONTRAST:  168mL OMNIPAQUE IOHEXOL 300 MG/ML  SOLN COMPARISON:  04/25/2017 FINDINGS: Lower chest: Mild cardiomegaly with coronary artery calcification. Lung bases are clear. Hepatobiliary: No focal liver lesion identified. Prior cholecystectomy with mild intra and extrahepatic biliary ductal dilatation. Pancreas: Unremarkable. No pancreatic ductal dilatation or surrounding inflammatory changes. Spleen: Normal in size without focal abnormality. Adrenals/Urinary Tract: Unremarkable adrenal glands. Bilateral renal atrophy. No focal renal lesion, stone, or hydronephrosis. Ureters are unremarkable. Urinary bladder is decompressed, limiting its evaluation. Stomach/Bowel: Stomach is within normal limits. Unremarkable small bowel. No dilated loops of bowel. Moderate volume of stool throughout the colon. No focal bowel wall thickening or inflammatory changes. Vascular/Lymphatic: Atherosclerosis  of the aortoiliac axis without aneurysm. IVC filter remains in place. Vascular graft is partially visualized within the left thigh. No abdominopelvic lymphadenopathy.  Reproductive: Status post hysterectomy. No adnexal masses. Other: No free fluid. No abdominopelvic fluid collection. No pneumoperitoneum. Small fat containing paraumbilical hernia. Mild diffuse anasarca. 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 No acute osseous findings. IMPRESSION: 1. No acute abdominopelvic findings. 2. Moderate volume of stool throughout the colon. 3. Findings of end-stage renal disease. 4. Aortic atherosclerosis. (ICD10-I70.0). Electronically Signed   By: Davina Poke D.O.   On: 03/19/2020 15:05    Procedures Procedures (including critical care time)  Medications Ordered in ED Medications  sodium chloride flush (NS) 0.9 % injection 10-40 mL (10 mLs Intracatheter Given 03/19/20 0956)  sodium chloride flush (NS) 0.9 % injection 10-40 mL (has no administration in time range)  Chlorhexidine Gluconate Cloth 2 % PADS 6 each (has no administration in time range)  pantoprazole (PROTONIX) injection 40 mg (40 mg Intravenous Given 03/19/20 0949)  HYDROmorphone (DILAUDID) injection 0.5 mg (0.5 mg Intravenous Given 03/19/20 0957)  iohexol (OMNIPAQUE) 300 MG/ML solution 100 mL (100 mLs Intravenous Contrast Given 03/19/20 1444)    ED Course  I have reviewed the triage vital signs and the nursing notes.  Pertinent labs & imaging results that were available during my care of the patient were reviewed by me and considered in my medical decision making (see chart for details).    MDM Rules/Calculators/A&P                         Patient with epigastric abdominal pain labs and CT scan are unremarkable except for constipation.  Patient is already on proton pump inhibitor.  She will be sent home with some pain medicine and MiraLAX and will follow up with her PCP.  She is told to go to dialysis tomorrow.  Patient also has many bacteria but the specimen shows a lot of squamous cells so she will get a urine culture  done        This patient presents to the ED for concern of abdominal pain, this involves an extensive number of treatment options, and is a complaint that carries with it a high risk of complications and morbidity.  The differential diagnosis includes gastritis abdominal cancer   Lab Tests:   I Ordered, reviewed, and interpreted labs, which included CBC and chemistries which were unremarkable  Medicines ordered:   I ordered medication Dilaudid for pain Protonix for gastritis  Imaging Studies ordered:   I ordered imaging studies which included CT of the abdomen  I independently visualized and interpreted imaging which showed constipation but otherwise negative  Additional history obtained:   Additional history obtained from records  Previous records obtained and reviewed.  Consultations Obtained:  Reevaluation:  After the interventions stated above, I reevaluated the patient and found mild improvement  Critical Interventions:  .   Final Clinical Impression(s) / ED Diagnoses Final diagnoses:  Pain of upper abdomen    Rx / DC Orders ED Discharge Orders         Ordered    polyethylene glycol (MIRALAX / GLYCOLAX) 17 g packet  Daily        03/19/20 1543    HYDROcodone-acetaminophen (NORCO/VICODIN) 5-325 MG tablet  Every 6 hours PRN        03/19/20 1543           Milton Ferguson, MD  03/19/20 1556  

## 2020-03-19 NOTE — ED Notes (Signed)
DC instructions and prescriptions reviewed with pt and family over phone.  Both verbalized understanding. Pt. D/C.

## 2020-03-20 LAB — URINE CULTURE

## 2020-03-21 DIAGNOSIS — M6281 Muscle weakness (generalized): Secondary | ICD-10-CM | POA: Insufficient documentation

## 2020-03-22 ENCOUNTER — Emergency Department (HOSPITAL_COMMUNITY): Payer: 59

## 2020-03-22 ENCOUNTER — Inpatient Hospital Stay (HOSPITAL_COMMUNITY)
Admission: EM | Admit: 2020-03-22 | Discharge: 2020-03-26 | DRG: 637 | Disposition: A | Discharge status: Skilled Nursing Facility | Payer: 59 | Attending: Family Medicine | Admitting: Family Medicine

## 2020-03-22 ENCOUNTER — Encounter (HOSPITAL_COMMUNITY): Payer: Self-pay | Admitting: Emergency Medicine

## 2020-03-22 DIAGNOSIS — R4182 Altered mental status, unspecified: Secondary | ICD-10-CM | POA: Diagnosis not present

## 2020-03-22 DIAGNOSIS — D6859 Other primary thrombophilia: Secondary | ICD-10-CM | POA: Diagnosis present

## 2020-03-22 DIAGNOSIS — E1122 Type 2 diabetes mellitus with diabetic chronic kidney disease: Secondary | ICD-10-CM | POA: Diagnosis present

## 2020-03-22 DIAGNOSIS — Z9049 Acquired absence of other specified parts of digestive tract: Secondary | ICD-10-CM | POA: Diagnosis not present

## 2020-03-22 DIAGNOSIS — J189 Pneumonia, unspecified organism: Secondary | ICD-10-CM | POA: Diagnosis not present

## 2020-03-22 DIAGNOSIS — R413 Other amnesia: Secondary | ICD-10-CM | POA: Diagnosis present

## 2020-03-22 DIAGNOSIS — Z86718 Personal history of other venous thrombosis and embolism: Secondary | ICD-10-CM | POA: Diagnosis not present

## 2020-03-22 DIAGNOSIS — Z86018 Personal history of other benign neoplasm: Secondary | ICD-10-CM | POA: Diagnosis not present

## 2020-03-22 DIAGNOSIS — L89611 Pressure ulcer of right heel, stage 1: Secondary | ICD-10-CM | POA: Diagnosis present

## 2020-03-22 DIAGNOSIS — Z794 Long term (current) use of insulin: Secondary | ICD-10-CM

## 2020-03-22 DIAGNOSIS — Z79899 Other long term (current) drug therapy: Secondary | ICD-10-CM | POA: Diagnosis not present

## 2020-03-22 DIAGNOSIS — K219 Gastro-esophageal reflux disease without esophagitis: Secondary | ICD-10-CM | POA: Diagnosis present

## 2020-03-22 DIAGNOSIS — R079 Chest pain, unspecified: Secondary | ICD-10-CM

## 2020-03-22 DIAGNOSIS — N2581 Secondary hyperparathyroidism of renal origin: Secondary | ICD-10-CM | POA: Diagnosis present

## 2020-03-22 DIAGNOSIS — E8770 Fluid overload, unspecified: Secondary | ICD-10-CM

## 2020-03-22 DIAGNOSIS — Z20822 Contact with and (suspected) exposure to covid-19: Secondary | ICD-10-CM | POA: Diagnosis present

## 2020-03-22 DIAGNOSIS — K5900 Constipation, unspecified: Secondary | ICD-10-CM | POA: Diagnosis present

## 2020-03-22 DIAGNOSIS — I132 Hypertensive heart and chronic kidney disease with heart failure and with stage 5 chronic kidney disease, or end stage renal disease: Secondary | ICD-10-CM | POA: Diagnosis present

## 2020-03-22 DIAGNOSIS — E162 Hypoglycemia, unspecified: Secondary | ICD-10-CM | POA: Diagnosis present

## 2020-03-22 DIAGNOSIS — Z833 Family history of diabetes mellitus: Secondary | ICD-10-CM | POA: Diagnosis not present

## 2020-03-22 DIAGNOSIS — L8990 Pressure ulcer of unspecified site, unspecified stage: Secondary | ICD-10-CM | POA: Diagnosis present

## 2020-03-22 DIAGNOSIS — Z992 Dependence on renal dialysis: Secondary | ICD-10-CM | POA: Diagnosis not present

## 2020-03-22 DIAGNOSIS — N186 End stage renal disease: Secondary | ICD-10-CM | POA: Diagnosis present

## 2020-03-22 DIAGNOSIS — E11649 Type 2 diabetes mellitus with hypoglycemia without coma: Principal | ICD-10-CM | POA: Diagnosis present

## 2020-03-22 DIAGNOSIS — D631 Anemia in chronic kidney disease: Secondary | ICD-10-CM | POA: Diagnosis present

## 2020-03-22 DIAGNOSIS — Z803 Family history of malignant neoplasm of breast: Secondary | ICD-10-CM

## 2020-03-22 DIAGNOSIS — F329 Major depressive disorder, single episode, unspecified: Secondary | ICD-10-CM | POA: Diagnosis present

## 2020-03-22 DIAGNOSIS — J449 Chronic obstructive pulmonary disease, unspecified: Secondary | ICD-10-CM | POA: Diagnosis present

## 2020-03-22 DIAGNOSIS — Z8249 Family history of ischemic heart disease and other diseases of the circulatory system: Secondary | ICD-10-CM

## 2020-03-22 DIAGNOSIS — Z7901 Long term (current) use of anticoagulants: Secondary | ICD-10-CM | POA: Diagnosis not present

## 2020-03-22 DIAGNOSIS — R109 Unspecified abdominal pain: Secondary | ICD-10-CM | POA: Diagnosis present

## 2020-03-22 DIAGNOSIS — Z8673 Personal history of transient ischemic attack (TIA), and cerebral infarction without residual deficits: Secondary | ICD-10-CM | POA: Diagnosis not present

## 2020-03-22 DIAGNOSIS — L89151 Pressure ulcer of sacral region, stage 1: Secondary | ICD-10-CM | POA: Diagnosis present

## 2020-03-22 LAB — CBG MONITORING, ED
Glucose-Capillary: 25 mg/dL — CL (ref 70–99)
Glucose-Capillary: 117 mg/dL — ABNORMAL HIGH (ref 70–99)
Glucose-Capillary: 139 mg/dL — ABNORMAL HIGH (ref 70–99)
Glucose-Capillary: 126 mg/dL — ABNORMAL HIGH (ref 70–99)
Glucose-Capillary: 83 mg/dL (ref 70–99)
Glucose-Capillary: 61 mg/dL — ABNORMAL LOW (ref 70–99)
Glucose-Capillary: 141 mg/dL — ABNORMAL HIGH (ref 70–99)
Glucose-Capillary: 95 mg/dL (ref 70–99)
Glucose-Capillary: 79 mg/dL (ref 70–99)
Glucose-Capillary: 62 mg/dL — ABNORMAL LOW (ref 70–99)
Glucose-Capillary: 71 mg/dL (ref 70–99)
Glucose-Capillary: 76 mg/dL (ref 70–99)
Glucose-Capillary: 86 mg/dL (ref 70–99)
Glucose-Capillary: 89 mg/dL (ref 70–99)

## 2020-03-22 LAB — I-STAT VENOUS BLOOD GAS, ED
Acid-Base Excess: 4 mmol/L — ABNORMAL HIGH (ref 0.0–2.0)
Bicarbonate: 30.1 mmol/L — ABNORMAL HIGH (ref 20.0–28.0)
Calcium, Ion: 1 mmol/L — ABNORMAL LOW (ref 1.15–1.40)
HCT: 33 % — ABNORMAL LOW (ref 36.0–46.0)
Hemoglobin: 11.2 g/dL — ABNORMAL LOW (ref 12.0–15.0)
O2 Saturation: 41 %
Potassium: 5.4 mmol/L — ABNORMAL HIGH (ref 3.5–5.1)
Sodium: 136 mmol/L (ref 135–145)
TCO2: 32 mmol/L (ref 22–32)
pCO2, Ven: 53.1 mmHg (ref 44.0–60.0)
pH, Ven: 7.361 (ref 7.250–7.430)
pO2, Ven: 25 mmHg — CL (ref 32.0–45.0)

## 2020-03-22 LAB — CBC
HCT: 33.1 % — ABNORMAL LOW (ref 36.0–46.0)
Hemoglobin: 10.5 g/dL — ABNORMAL LOW (ref 12.0–15.0)
MCH: 25.7 pg — ABNORMAL LOW (ref 26.0–34.0)
MCHC: 31.7 g/dL (ref 30.0–36.0)
MCV: 80.9 fL (ref 80.0–100.0)
Platelets: 283 10*3/uL (ref 150–400)
RBC: 4.09 MIL/uL (ref 3.87–5.11)
RDW: 14.7 % (ref 11.5–15.5)
WBC: 9.4 10*3/uL (ref 4.0–10.5)
nRBC: 0 % (ref 0.0–0.2)

## 2020-03-22 LAB — COMPREHENSIVE METABOLIC PANEL
ALT: 8 U/L (ref 0–44)
AST: 16 U/L (ref 15–41)
Albumin: 2.7 g/dL — ABNORMAL LOW (ref 3.5–5.0)
Alkaline Phosphatase: 49 U/L (ref 38–126)
Anion gap: 15 (ref 5–15)
BUN: 62 mg/dL — ABNORMAL HIGH (ref 8–23)
CO2: 26 mmol/L (ref 22–32)
Calcium: 8.3 mg/dL — ABNORMAL LOW (ref 8.9–10.3)
Chloride: 95 mmol/L — ABNORMAL LOW (ref 98–111)
Creatinine, Ser: 12.35 mg/dL — ABNORMAL HIGH (ref 0.44–1.00)
GFR calc Af Amer: 3 mL/min — ABNORMAL LOW (ref >60)
GFR calc non Af Amer: 3 mL/min — ABNORMAL LOW (ref >60)
Glucose, Bld: 69 mg/dL — ABNORMAL LOW (ref 70–99)
Potassium: 5.5 mmol/L — ABNORMAL HIGH (ref 3.5–5.1)
Sodium: 136 mmol/L (ref 135–145)
Total Bilirubin: 0.8 mg/dL (ref 0.3–1.2)
Total Protein: 6.6 g/dL (ref 6.5–8.1)

## 2020-03-22 LAB — RESPIRATORY PANEL BY RT PCR (FLU A&B, COVID)
Influenza A by PCR: NEGATIVE
Influenza B by PCR: NEGATIVE
SARS Coronavirus 2 by RT PCR: NEGATIVE

## 2020-03-22 LAB — PROTIME-INR
INR: 2.5 — ABNORMAL HIGH (ref 0.8–1.2)
Prothrombin Time: 26.4 seconds — ABNORMAL HIGH (ref 11.4–15.2)

## 2020-03-22 LAB — BRAIN NATRIURETIC PEPTIDE: B Natriuretic Peptide: 1141.1 pg/mL — ABNORMAL HIGH (ref 0.0–100.0)

## 2020-03-22 LAB — TROPONIN I (HIGH SENSITIVITY)
Troponin I (High Sensitivity): 115 ng/L (ref <18)
Troponin I (High Sensitivity): 111 ng/L (ref <18)

## 2020-03-22 LAB — APTT: aPTT: 52 seconds — ABNORMAL HIGH (ref 24–36)

## 2020-03-22 LAB — LACTIC ACID, PLASMA: Lactic Acid, Venous: 1.5 mmol/L (ref 0.5–1.9)

## 2020-03-22 MED ORDER — COLLAGENASE 250 UNIT/GM EX OINT
TOPICAL_OINTMENT | Freq: Two times a day (BID) | CUTANEOUS | Status: DC
  Filled 2020-03-22: qty 30

## 2020-03-22 MED ORDER — ALBUTEROL SULFATE HFA 108 (90 BASE) MCG/ACT IN AERS
2.0000 | INHALATION_SPRAY | RESPIRATORY_TRACT | Status: DC | PRN
  Filled 2020-03-22: qty 6.7

## 2020-03-22 MED ORDER — CHLORHEXIDINE GLUCONATE CLOTH 2 % EX PADS
6.0000 | MEDICATED_PAD | Freq: Every day | CUTANEOUS | Status: DC
  Administered 2020-03-22: 6 via TOPICAL

## 2020-03-22 MED ORDER — SODIUM CHLORIDE 0.9% FLUSH
10.0000 mL | INTRAVENOUS | Status: DC | PRN
  Administered 2020-03-26: 10 mL

## 2020-03-22 MED ORDER — POLYETHYLENE GLYCOL 3350 17 G PO PACK: 17.0000 g | PACK | Freq: Every day | ORAL | Status: DC | PRN

## 2020-03-22 MED ORDER — CHLORHEXIDINE GLUCONATE CLOTH 2 % EX PADS: 6.0000 | MEDICATED_PAD | Freq: Every day | CUTANEOUS | Status: DC

## 2020-03-22 MED ORDER — IOHEXOL 350 MG/ML SOLN
100.0000 mL | Freq: Once | INTRAVENOUS | Status: AC | PRN
  Administered 2020-03-22: 100 mL via INTRAVENOUS

## 2020-03-22 MED ORDER — LIDOCAINE-PRILOCAINE 2.5-2.5 % EX CREA
1.0000 "application " | TOPICAL_CREAM | CUTANEOUS | Status: DC
  Filled 2020-03-22: qty 5

## 2020-03-22 MED ORDER — ACETAMINOPHEN 325 MG PO TABS: 650.0000 mg | ORAL_TABLET | Freq: Four times a day (QID) | ORAL | Status: DC | PRN

## 2020-03-22 MED ORDER — HYDROMORPHONE HCL 1 MG/ML IJ SOLN
0.5000 mg | INTRAMUSCULAR | Status: DC | PRN
  Administered 2020-03-23 – 2020-03-24 (×2): 0.5 mg via INTRAVENOUS
  Filled 2020-03-22 (×2): qty 1

## 2020-03-22 MED ORDER — DEXTROSE 50 % IV SOLN
INTRAVENOUS | Status: AC
  Administered 2020-03-22: 50 mL via INTRAVENOUS
  Filled 2020-03-22: qty 50

## 2020-03-22 MED ORDER — HEPARIN SODIUM (PORCINE) 5000 UNIT/ML IJ SOLN
5000.0000 [IU] | Freq: Three times a day (TID) | INTRAMUSCULAR | Status: DC
  Administered 2020-03-23 (×3): 5000 [IU] via SUBCUTANEOUS
  Filled 2020-03-22 (×3): qty 1

## 2020-03-22 MED ORDER — PANTOPRAZOLE SODIUM 40 MG IV SOLR
40.0000 mg | Freq: Two times a day (BID) | INTRAVENOUS | Status: DC
  Administered 2020-03-23 – 2020-03-25 (×5): 40 mg via INTRAVENOUS
  Filled 2020-03-22 (×5): qty 40

## 2020-03-22 MED ORDER — DEXTROSE 50 % IV SOLN
1.0000 | INTRAVENOUS | Status: DC | PRN
  Administered 2020-03-22 (×2): 50 mL via INTRAVENOUS
  Filled 2020-03-22: qty 50

## 2020-03-22 MED ORDER — LIDOCAINE 5 % EX PTCH
1.0000 | MEDICATED_PATCH | CUTANEOUS | Status: DC
  Administered 2020-03-23: 1 via TRANSDERMAL
  Filled 2020-03-22: qty 1

## 2020-03-22 MED ORDER — HEPARIN SOD (PORK) LOCK FLUSH 100 UNIT/ML IV SOLN
500.0000 [IU] | Freq: Once | INTRAVENOUS | Status: AC
  Administered 2020-03-26: 500 [IU]
  Filled 2020-03-22: qty 5

## 2020-03-22 MED ORDER — SODIUM CHLORIDE 0.9 % IV SOLN
1.0000 g | INTRAVENOUS | Status: DC
  Administered 2020-03-22 – 2020-03-25 (×3): 1 g via INTRAVENOUS
  Filled 2020-03-22 (×3): qty 10

## 2020-03-22 MED ORDER — HYDROMORPHONE HCL 1 MG/ML IJ SOLN
0.5000 mg | Freq: Once | INTRAMUSCULAR | Status: AC
  Administered 2020-03-22: 0.5 mg via INTRAVENOUS
  Filled 2020-03-22: qty 1

## 2020-03-22 MED ORDER — ACETAMINOPHEN 650 MG RE SUPP: 650.0000 mg | Freq: Four times a day (QID) | RECTAL | Status: DC | PRN

## 2020-03-22 MED ORDER — DEXTROSE 10 % IV SOLN: INTRAVENOUS | Status: AC

## 2020-03-22 NOTE — Progress Notes (Signed)
FPTS Interim Progress Note  S:Interviewed patient bedside in the ED.  During interview was confused only A&O x1 (name only). Was unable to give history.    Called patients daughter to obtain clearer history. States that mother has had slow mental decline over the last few months. Has had a sharpe decline over the last few weeks and then had a severe decline since Friday 10/1. She was seen by her at home doctor on Saturday who noticed the change in patient and was going to begin paperwork for SNF placement. On Wednesday 9/29 was sent from Dialysis to the hospital and was released on Thursday evening. Daughter reports that patient has had several episodes of hypo and hyper glycemia and that she and her husband administer the patients insulin. States that patient has many chronic pain issues: DDD, knee pain, and fall that disrupted her total knee. She states that whenever patient is hospitalized she calls her, patient had not done so or asked to call her which is very unusual for her.  O: BP (!) 131/111   Pulse 70   Temp 98.3 F (36.8 C) (Oral)   Resp 14   SpO2 99%   Physical Exam Vitals and nursing note reviewed.  Constitutional:      General: She is not in acute distress.    Appearance: Normal appearance. She is obese. She is not toxic-appearing or diaphoretic.  HENT:     Head: Normocephalic and atraumatic.  Cardiovascular:     Rate and Rhythm: Normal rate and regular rhythm.     Pulses: Normal pulses.          Radial pulses are 2+ on the right side and 2+ on the left side.       Dorsalis pedis pulses are 2+ on the right side and 2+ on the left side.     Heart sounds: Normal heart sounds, S1 normal and S2 normal. No murmur heard.   Pulmonary:     Effort: Pulmonary effort is normal. No respiratory distress.     Breath sounds: Normal breath sounds. No wheezing.  Abdominal:     General: There is no distension.     Tenderness: There is no abdominal tenderness.  Musculoskeletal:      Right lower leg: 1+ Edema present.     Left lower leg: 1+ Edema present.  Neurological:     Comments: Alert and Oriented x1 (name only)      A/P: Hypoglycemia: Has had low blood sugars since admission. Has had a total of 3 Amps of Dextrose 50%. Last CBG was 86. Will start gently IV fluids, consulted Nephrology for fluid level recommendations. Recommend D10 at 40 cc for 10-12 hours will be safe to do. -IVF D10 40 cc/hr for 10 hr -CBG every hour -Continue 1 AMP D50 PRN for low blood sugar   Briant Cedar, MD 03/22/2020, 10:04 PM PGY-1, [facility] pager [phone]

## 2020-03-22 NOTE — Consult Note (Signed)
[facility] Renal Consultation Note    Indication for Consultation:  Management of ESRD/hemodialysis, anemia, hypertension/volume, and secondary hyperparathyroidism. PCP:  HPI: Ashlee Miles is a 79 y.o. female with ESRD, HTN, T2DM, Hx protein C deficiency, Hx DVT/PE (s/p IVC filter + warfarin), and Hx subclavian vein stenosis who is being admitted with hypoglycemia and AMS.  Pt seen in ED bed. She is confused, but able to answer some questions. Per notes, noted to be profoundly altered this morning. EMS called - BS noted to be 20's - given glucagon. In ED - she was given 1 amp D50 as well. Initial labs showed Na 136, K 5.5, CO2 26, BUN 62, Cr 12.3, Ca 8.3, WBC 9.4, Hgb 10.5, INR 2.5, ^ BNP, LA 1.5, Trop 115. CXR with diffuse interstitial opacities, likely pulm edema.  She c/o epigastric/lower chest pain. Denies N/V or dyspnea. The epigastric pain has been ongoing all week. Was actually seen in ED on 9/30. Labs stable and abdominal CT at that time showed constipation only. No fever or chills. Denies prior cardiac Hx or MI. She does not think she took her insulin incorrectly.  Dialyzes on MWF schedule at [facility]. Her last HD was 9/29 (Wed) which she completed only partially - she ended the treatment early on that day d/t the abdominal pain, as above. She uses a L thigh AVG as her access - no recent issues.   Past Medical History:  Diagnosis Date  . CHF (congestive heart failure) ([place])   . Diabetes mellitus without complication ([place])   . ESRD (end stage renal disease) on dialysis ([place])   . History of pituitary adenoma   . Hypertension   . Protein C deficiency [facility])    Past Surgical History:  Procedure Laterality Date  . AV FISTULA PLACEMENT Left 03/16/2017   Procedure: INSERTION OF ARTERIOVENOUS (AV) GORE-TEX GRAFT THIGH-LEFT;  Surgeon: Angelia Mould, MD;  Location: [place];  Service: Vascular;  Laterality: Left;  . CHOLECYSTECTOMY    . DIALYSIS  FISTULA CREATION     clotted off  . FALSE ANEURYSM REPAIR Left 05/10/2017   Procedure: REPAIR FALSE ANEURYSM left thigh AVGG using Gore 84mmx5cm Viabahn Endoprosthesis;  Surgeon: Serafina Mitchell, MD;  Location[facility] OR;  Service: Vascular;  Laterality: Left;  . I & D EXTREMITY Left 07/14/2017   Procedure: IRRIGATION AND DEBRIDEMENT DISTAL THIGH ABSCESS;  Surgeon: Angelia Mould, MD;  Location: [place];  Service: Vascular;  Laterality: Left;  . INSERTION OF DIALYSIS CATHETER Right 03/16/2017   Procedure: INSERTION OF RIGHT FEMORAL TUNNELED DIALYSIS CATHETER;  Surgeon: Angelia Mould, MD;  Location: [place];  Service: Vascular;  Laterality: Right;  . IR AV DIALY SHUNT INTRO [place] W/PTA/IMG LEFT  11/16/2017  . IR DIALY SHUNT INTRO NEEDLE/INTRACATH INITIAL W/IMG LEFT Left 01/10/2019  . IR THROMBECTOMY AV FISTULA W/THROMBOLYSIS/PTA INC/SHUNT/IMG LEFT Left 06/22/2017  . IR THROMBECTOMY AV FISTULA W/THROMBOLYSIS/PTA/STENT INC/SHUNT/IMG LT Left 03/30/2018  . IR US GUIDE VASC ACCESS LEFT  06/22/2017  . IR US GUIDE VASC ACCESS LEFT  03/30/2018  . IVC FILTER INSERTION    . PORTACATH PLACEMENT    . THROMBECTOMY AND REVISION OF ARTERIOVENTOUS (AV) GORETEX  GRAFT Left 06/02/2017   Procedure: EVACUATION OF LEFT THIGH HEMATOMA  AND APPLICATION OF WOUND VAC;  Surgeon: Serafina Mitchell, MD;  Location: [facility] OR;  Service: Vascular;  Laterality: Left;   Family History  Problem Relation Age of Onset  . Breast cancer Mother   .  Diabetes Sister   . Diabetes Brother   . CAD Other   . Stroke Neg Hx    Social History:  reports that she has never smoked. She has never used smokeless tobacco. She reports that she does not drink alcohol and does not use drugs.  ROS: As per HPI, limited d/t AMS.  Physical Exam: Vitals:   03/22/20 1115 03/22/20 1130 03/22/20 1145  BP: (!) 152/137 (!) 162/112 (!) 130/91  Pulse: (!) 163    Resp: 17 (!) 21 15  SpO2: 97%       General: Well developed, well  nourished, in no acute distress. Using nasal O2. Head: Normocephalic, atraumatic, sclera non-icteric, mucus membranes are moist. Neck: Supple without lymphadenopathy/masses. JVD not elevated. Lungs: Clear bilaterally in upper lobes, reduced air movement in B bases (poor inspiratory effort) Heart: RRR with normal S1, S2. No murmurs, rubs, or gallops appreciated. Abdomen: Soft, mildly tender to palpation without guarding. Musculoskeletal:  Strength and tone appear normal for age. Lower extremities: No edema or ischemic changes, no open wounds. Neuro: Alert, oriented to person only Dialysis Access: L thigh AVG + bruit  No Known Allergies Prior to Admission medications   Medication Sig Start Date End Date Taking? Authorizing Provider  albuterol (PROVENTIL) (2.5 MG/3ML) 0.083% nebulizer solution 62ml vial by nebulizer every 4 hours And As Needed for wheezing and Shortness of breath Patient not taking: Reported on 03/22/2020 12/19/18   Collene Gobble, MD  albuterol (PROVENTIL) (2.5 MG/3ML) 0.083% nebulizer solution Take 3 mLs (2.5 mg total) by nebulization every 4 (four) hours as needed for wheezing or shortness of breath. Diagnosis COPD J44.9 12/20/18   Collene Gobble, MD  albuterol (PROVENTIL) (2.5 MG/3ML) 0.083% nebulizer solution USE 1 VIAL IN NEBULIZER EVERY 4 HOURS - and as needed Patient not taking: Reported on 03/19/2020 10/21/19   Collene Gobble, MD  albuterol (VENTOLIN HFA) 108 (90 Base) MCG/ACT inhaler Inhale 2 puffs into the lungs every 4 (four) hours as needed for wheezing or shortness of breath.     [provider]  Amino Acids-Protein Hydrolys (FEEDING SUPPLEMENT, PRO-STAT SUGAR FREE 64,) LIQD Take 30 mLs by mouth 2 (two) times daily. 04/12/17   Sheikh, Omair Latif, DO  BD PEN NEEDLE NANO U/F 32G X 4 MM MISC 3 (three) times daily.  01/18/19   [provider]  calcitRIOL (ROCALTROL) 0.5 MCG capsule Take 0.5 mcg by mouth daily.    [provider]  carvedilol (COREG)  3.125 MG tablet Take 3.125 mg by mouth 2 (two) times daily with a meal.  04/16/18   [provider]  cinacalcet (SENSIPAR) 30 MG tablet Take 30 mg by mouth 3 (three) times a week.  02/11/19   [provider]  collagenase (SANTYL) ointment Apply topically 2 (two) times daily. Apply to L thigh wound BID. Wet-to-dry dressing on top Santyl BID 07/21/17   Dessa Phi, DO  docusate sodium (COLACE) 100 MG capsule Take 1 capsule (100 mg total) by mouth 2 (two) times daily. 04/12/17   Raiford Noble Latif, DO  famotidine (PEPCID) 20 MG tablet Take 1 tablet (20 mg total) daily by mouth. 04/28/17   Lavina Hamman, MD  gabapentin (NEURONTIN) 100 MG capsule Take 100 mg by mouth at bedtime.  01/19/19   [provider]  HYDROcodone-acetaminophen (NORCO/VICODIN) 5-325 MG tablet Take 1 tablet by mouth every 6 (six) hours as needed for moderate pain. 03/19/20   Milton Ferguson, MD  insulin aspart protamine- aspart (NOVOLOG MIX  70/30) (70-30) 100 UNIT/ML injection Inject 30-40 Units into the skin See admin instructions. 40 units in the morning and 30 units in the evening or Sliding Scale    [provider]  lidocaine-prilocaine (EMLA) cream Apply 1 application topically as directed.  10/10/18   [provider]  midodrine (PROAMATINE) 10 MG tablet Take 1 tablet (10 mg total) by mouth every Monday, Wednesday, and Friday with hemodialysis. 04/14/17   Raiford Noble Latif, DO  multivitamin (RENA-VIT) TABS tablet Take 1 tablet by mouth at bedtime. 04/12/17   Raiford Noble Latif, DO  Oxycodone HCl 10 MG TABS Take 10 mg by mouth every 6 (six) hours as needed (pain).  01/18/19   [provider]  pantoprazole (PROTONIX) 40 MG tablet Take 1 tablet (40 mg total) 2 (two) times daily before a meal by mouth. 04/28/17   Lavina Hamman, MD  PARoxetine (PAXIL) 10 MG tablet Take 1 tablet (10 mg total) daily by mouth. 04/28/17   Lavina Hamman, MD  polyethylene glycol (MIRALAX / GLYCOLAX) 17 g  packet Take 17 g by mouth daily. 03/19/20   Milton Ferguson, MD  sevelamer carbonate (RENVELA) 800 MG tablet Take 800 mg by mouth 3 (three) times daily with meals.    [provider]  simvastatin (ZOCOR) 40 MG tablet Take 40 mg by mouth daily at 6 PM.    [provider]  sucralfate (CARAFATE) 1 GM/10ML suspension Take 10 mLs (1 g total) 4 (four) times daily -  with meals and at bedtime by mouth. 04/28/17   Lavina Hamman, MD  thiamine (VITAMIN B-1) 100 MG tablet Take 1 tablet (100 mg total) by mouth daily. 06/09/17   Dessa Phi, DO  warfarin (COUMADIN) 5 MG tablet Take 5 mg by mouth daily.     [provider]  ZTLIDO 1.8 % PTCH Place 1 patch onto the skin every other day. 02/28/20   [provider]   Current Facility-Administered Medications  Medication Dose Route Frequency Provider Last Rate Last Admin  . Chlorhexidine Gluconate Cloth 2 % PADS 6 each  6 each Topical Daily Maudie Flakes, MD      . dextrose 50 % solution 50 mL  1 ampule Intravenous PRN Maudie Flakes, MD   50 mL at 03/22/20 1148  . heparin lock flush 100 unit/mL  500 Units Intracatheter Once Lajean Saver, MD      . HYDROmorphone (DILAUDID) injection 0.5 mg  0.5 mg Intravenous Once Maudie Flakes, MD      . sodium chloride flush (NS) 0.9 % injection 10-40 mL  10-40 mL Intracatheter PRN Maudie Flakes, MD       Current Outpatient Medications  Medication Sig Dispense Refill  . albuterol (PROVENTIL) (2.5 MG/3ML) 0.083% nebulizer solution 10ml vial by nebulizer every 4 hours And As Needed for wheezing and Shortness of breath (Patient not taking: Reported on 03/22/2020) 75 mL 11  . albuterol (PROVENTIL) (2.5 MG/3ML) 0.083% nebulizer solution Take 3 mLs (2.5 mg total) by nebulization every 4 (four) hours as needed for wheezing or shortness of breath. Diagnosis COPD J44.9 180 mL 4  . albuterol (PROVENTIL) (2.5 MG/3ML) 0.083% nebulizer solution USE 1 VIAL IN NEBULIZER EVERY 4 HOURS - and as needed  (Patient not taking: Reported on 03/19/2020) 180 mL 0  . albuterol (VENTOLIN HFA) 108 (90 Base) MCG/ACT inhaler Inhale 2 puffs into the lungs every 4 (four) hours as needed for wheezing or shortness of breath.     Marland Kitchen  Amino Acids-Protein Hydrolys (FEEDING SUPPLEMENT, PRO-STAT SUGAR FREE 64,) LIQD Take 30 mLs by mouth 2 (two) times daily. 900 mL 0  . BD PEN NEEDLE NANO U/F 32G X 4 MM MISC 3 (three) times daily.     . calcitRIOL (ROCALTROL) 0.5 MCG capsule Take 0.5 mcg by mouth daily.    . carvedilol (COREG) 3.125 MG tablet Take 3.125 mg by mouth 2 (two) times daily with a meal.     . cinacalcet (SENSIPAR) 30 MG tablet Take 30 mg by mouth 3 (three) times a week.     . collagenase (SANTYL) ointment Apply topically 2 (two) times daily. Apply to L thigh wound BID. Wet-to-dry dressing on top Santyl BID 15 g 0  . docusate sodium (COLACE) 100 MG capsule Take 1 capsule (100 mg total) by mouth 2 (two) times daily. 10 capsule 0  . famotidine (PEPCID) 20 MG tablet Take 1 tablet (20 mg total) daily by mouth. 30 tablet 0  . gabapentin (NEURONTIN) 100 MG capsule Take 100 mg by mouth at bedtime.     Marland Kitchen HYDROcodone-acetaminophen (NORCO/VICODIN) 5-325 MG tablet Take 1 tablet by mouth every 6 (six) hours as needed for moderate pain. 20 tablet 0  . insulin aspart protamine- aspart (NOVOLOG MIX 70/30) (70-30) 100 UNIT/ML injection Inject 30-40 Units into the skin See admin instructions. 40 units in the morning and 30 units in the evening or Sliding Scale    . lidocaine-prilocaine (EMLA) cream Apply 1 application topically as directed.     . midodrine (PROAMATINE) 10 MG tablet Take 1 tablet (10 mg total) by mouth every Monday, Wednesday, and Friday with hemodialysis. 30 tablet 0  . multivitamin (RENA-VIT) TABS tablet Take 1 tablet by mouth at bedtime. 30 tablet 0  . Oxycodone HCl 10 MG TABS Take 10 mg by mouth every 6 (six) hours as needed (pain).     . pantoprazole (PROTONIX) 40 MG tablet Take 1 tablet (40 mg total) 2 (two)  times daily before a meal by mouth. 60 tablet 0  . PARoxetine (PAXIL) 10 MG tablet Take 1 tablet (10 mg total) daily by mouth. 30 tablet 0  . polyethylene glycol (MIRALAX / GLYCOLAX) 17 g packet Take 17 g by mouth daily. 14 each 0  . sevelamer carbonate (RENVELA) 800 MG tablet Take 800 mg by mouth 3 (three) times daily with meals.    . simvastatin (ZOCOR) 40 MG tablet Take 40 mg by mouth daily at 6 PM.    . sucralfate (CARAFATE) 1 GM/10ML suspension Take 10 mLs (1 g total) 4 (four) times daily -  with meals and at bedtime by mouth. 420 mL 0  . thiamine (VITAMIN B-1) 100 MG tablet Take 1 tablet (100 mg total) by mouth daily. 30 tablet 0  . warfarin (COUMADIN) 5 MG tablet Take 5 mg by mouth daily.     Marland Kitchen ZTLIDO 1.8 % PTCH Place 1 patch onto the skin every other day.     Labs: Basic Metabolic Panel: Recent Labs  Lab 03/19/20 1006 03/22/20 1130 03/22/20 1131  NA 136 136 136  K 4.4 5.5* 5.4*  CL 95* 95*  --   CO2 27 26  --   GLUCOSE 99 69*  --   BUN 31* 62*  --   CREATININE 7.86* 12.35*  --   CALCIUM 9.0 8.3*  --    Liver Function Tests: Recent Labs  Lab 03/19/20 1006 03/22/20 1130  AST 14* 16  ALT 8 8  ALKPHOS 48 49  BILITOT 0.8 0.8  PROT 6.8 6.6  ALBUMIN 2.9* 2.7*   Recent Labs  Lab 03/19/20 1006  LIPASE 22   CBC: Recent Labs  Lab 03/19/20 1055 03/22/20 1130 03/22/20 1131  WBC 7.9 9.4  --   NEUTROABS 5.3  --   --   HGB 9.8* 10.5* 11.2*  HCT 31.0* 33.1* 33.0*  MCV 82.9 80.9  --   PLT 188 283  --    CBG: Recent Labs  Lab 03/22/20 1113 03/22/20 1130 03/22/20 1204 03/22/20 1305  GLUCAP 25* 117* 139* 126*   Studies/Results: CT Head Wo Contrast  Result Date: 03/22/2020 CLINICAL DATA:  Hypoglycemia EXAM: CT HEAD WITHOUT CONTRAST TECHNIQUE: Contiguous axial images were obtained from the base of the skull through the vertex without intravenous contrast. COMPARISON:  June 22, 2017, March 02, 2017. FINDINGS: Brain: No evidence of acute infarction,  hemorrhage, hydrocephalus, extra-axial collection or mass lesion/mass effect. Vascular: No hyperdense vessel or unexpected calcification. Skull: Normal. Negative for fracture or focal lesion. Sinuses/Orbits: No acute finding. Unchanged LEFT mastoid effusion. Status post RIGHT cataract surgery. Other: None. IMPRESSION: No acute intracranial abnormality. Electronically Signed   By: Valentino Saxon MD   On: 03/22/2020 11:58   DG Chest Port 1 View  Result Date: 03/22/2020 CLINICAL DATA:  Altered mental status EXAM: PORTABLE CHEST 1 VIEW COMPARISON:  12/11/2018 FINDINGS: Right chest port catheter. Mild cardiomegaly. Mild, diffuse interstitial pulmonary opacity and possible small pleural effusions. Visualized skeletal structures are unremarkable. IMPRESSION: Mild, diffuse interstitial pulmonary opacity and possible small pleural effusions. Mild cardiomegaly. Findings may reflect edema or infection. No focal airspace opacity. Electronically Signed   By: Eddie Candle M.D.   On: 03/22/2020 12:12    Dialysis Orders:  MWF at [facility] --> last HD 9/29, only ran 1:46hr of 4hr, but left below EDW 4hr, 400/800, EDW 92.3kg, 2K/2.5Ca, AVG, heparin 3000 unit bolus - Hectoral 66mcg IV q HD - Mircera 75mcg IV q 4 weeks (last 9/15) - Home meds: Sensipar 30mg  on MWF, Renvela 2/meals.  Assessment/Plan: 1.  Hypoglycemia: Unclear etiology -- low intake v. insulin misuse v. brewing infection. Per hospitalist. 2.  AMS: Felt d/t #1 -- improved from presentation, not to baseline yet. 3.  Epigastric v. chest pain/^Troponin: No acute abdominal issues on 9/30 CT -- CTA chest 10/3 pending to rule out dissection. Trop 115 - high, trending. EKG without ST changes. 4.  ESRD: Usual MWF schedule, missed her last HD. CXR is wet but she looks fairly comfortable. Dialysis typically deferred on Sunday unless emergency -> Miles plan to dialyze her tomorrow AM. 5.  Hypertension/volume: BP high side. See above, UF as tolerated with  next HD - EDW may need to be lowered. 6.  Anemia: Hgb 11.2 - not due for ESA yet. 7.  Metabolic bone disease: Ca ok, Phos pending. Restart home binder (Renvela) when eating. 8. T2DM: See #1. On low dose Lantus at home - holding here. Per primary. 9. 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  Veneta Penton, PA-C 03/22/2020, 2:45 PM  [employer]

## 2020-03-22 NOTE — ED Notes (Signed)
Pt CBG of 62. RN notified.

## 2020-03-22 NOTE — ED Notes (Signed)
  EMT reported CBG of 62.  Went to assess patient and she was alert but had difficulty answering orientation questions.  Spoke with Anderson Malta RN and gave amp D50 for hypoglycemia protocol.   Patient on arrival was only oriented to person.

## 2020-03-22 NOTE — ED Provider Notes (Signed)
[facility] Emergency Department Provider Note MRN:  [id number]  Arrival date & time: 03/22/20     Chief Complaint   Altered mental status History of Present Illness   Ashlee Miles is a 79 y.o. year-old female with a history of CHF, ESRD, diabetes presenting to the ED with chief complaint of altered mental status.  EMS called for altered mental status, found to have blood sugar of 20.  Given glucagon in route.  After glucose patient is more alert, still confused.  Thinks that she fell and hit her head.  Still complaining of abdominal pain for the past several days.  I was unable to obtain an accurate HPI, PMH, or ROS due to the patient's altered mental status.  Level 5 caveat.  Review of Systems  Positive for altered mental status, fall, abdominal pain.  Patient's Health History    Past Medical History:  Diagnosis Date  . CHF (congestive heart failure) ([place])   . Diabetes mellitus without complication ([place])   . ESRD (end stage renal disease) on dialysis ([place])   . History of pituitary adenoma   . Hypertension   . Protein C deficiency [facility])     Past Surgical History:  Procedure Laterality Date  . AV FISTULA PLACEMENT Left 03/16/2017   Procedure: INSERTION OF ARTERIOVENOUS (AV) GORE-TEX GRAFT THIGH-LEFT;  Surgeon: Angelia Mould, MD;  Location: [place];  Service: Vascular;  Laterality: Left;  . CHOLECYSTECTOMY    . DIALYSIS FISTULA CREATION     clotted off  . FALSE ANEURYSM REPAIR Left 05/10/2017   Procedure: REPAIR FALSE ANEURYSM left thigh AVGG using Gore 44mmx5cm Viabahn Endoprosthesis;  Surgeon: Serafina Mitchell, MD;  Location: [facility] OR;  Service: Vascular;  Laterality: Left;  . I & D EXTREMITY Left 07/14/2017   Procedure: IRRIGATION AND DEBRIDEMENT DISTAL THIGH ABSCESS;  Surgeon: Angelia Mould, MD;  Location: [place];  Service: Vascular;  Laterality: Left;  . INSERTION OF DIALYSIS CATHETER Right 03/16/2017   Procedure: INSERTION OF RIGHT  FEMORAL TUNNELED DIALYSIS CATHETER;  Surgeon: Angelia Mould, MD;  Location: [place];  Service: Vascular;  Laterality: Right;  . IR AV DIALY SHUNT INTRO [place] W/PTA/IMG LEFT  11/16/2017  . IR DIALY SHUNT INTRO NEEDLE/INTRACATH INITIAL W/IMG LEFT Left 01/10/2019  . IR THROMBECTOMY AV FISTULA W/THROMBOLYSIS/PTA INC/SHUNT/IMG LEFT Left 06/22/2017  . IR THROMBECTOMY AV FISTULA W/THROMBOLYSIS/PTA/STENT INC/SHUNT/IMG LT Left 03/30/2018  . IR US GUIDE VASC ACCESS LEFT  06/22/2017  . IR US GUIDE VASC ACCESS LEFT  03/30/2018  . IVC FILTER INSERTION    . PORTACATH PLACEMENT    . THROMBECTOMY AND REVISION OF ARTERIOVENTOUS (AV) GORETEX  GRAFT Left 06/02/2017   Procedure: EVACUATION OF LEFT THIGH HEMATOMA  AND APPLICATION OF WOUND VAC;  Surgeon: Serafina Mitchell, MD;  Location: [facility] OR;  Service: Vascular;  Laterality: Left;    Family History  Problem Relation Age of Onset  . Breast cancer Mother   . Diabetes Sister   . Diabetes Brother   . CAD Other   . Stroke Neg Hx     Social History   Socioeconomic History  . Marital status: Married    Spouse name: Not on file  . Number of children: Not on file  . Years of education: Not on file  . Highest education level: Not on file  Occupational History  . Occupation: retired  Tobacco Use  . Smoking status: Never Smoker  . Smokeless tobacco: Never Used  Vaping Use  .  Vaping Use: Never used  Substance and Sexual Activity  . Alcohol use: No  . Drug use: No  . Sexual activity: Not on file  Other Topics Concern  . Not on file  Social History Narrative  . Not on file   Social Determinants of Health   Financial Resource Strain:   . Difficulty of Paying Living Expenses: Not on file  Food Insecurity:   . Worried About Charity fundraiser in the Last Year: Not on file  . Ran Out of Food in the Last Year: Not on file  Transportation Needs:   . Lack of Transportation (Medical): Not on file  . Lack of Transportation (Non-Medical):  Not on file  Physical Activity:   . Days of Exercise per Week: Not on file  . Minutes of Exercise per Session: Not on file  Stress:   . Feeling of Stress : Not on file  Social Connections:   . Frequency of Communication with Friends and Family: Not on file  . Frequency of Social Gatherings with Friends and Family: Not on file  . Attends Religious Services: Not on file  . Active Member of Clubs or Organizations: Not on file  . Attends Archivist Meetings: Not on file  . Marital Status: Not on file  Intimate Partner Violence:   . Fear of Current or Ex-Partner: Not on file  . Emotionally Abused: Not on file  . Physically Abused: Not on file  . Sexually Abused: Not on file     Physical Exam   Vitals:   03/22/20 1130 03/22/20 1145  BP: (!) 162/112 (!) 130/91  Pulse:    Resp: (!) 21 15  SpO2:      CONSTITUTIONAL: Ill-appearing NEURO: Somnolent, confused speech, moves all extremities EYES:  eyes equal and reactive ENT/NECK:  no LAD, no JVD CARDIO: Regular rate, well-perfused, normal S1 and S2 PULM:  CTAB no wheezing or rhonchi GI/GU:  normal bowel sounds, non-distended, non-tender MSK/SPINE:  No gross deformities, no edema SKIN:  no rash, atraumatic PSYCH: Unable to assess  *Additional and/or pertinent findings included in MDM below  Diagnostic and Interventional Summary    EKG Interpretation  Date/Time:  Sunday March 22 2020 11:29:07 EDT Ventricular Rate:  77 PR Interval:    QRS Duration: 87 QT Interval:  382 QTC Calculation: 433 R Axis:   29 Text Interpretation: Sinus rhythm Atrial premature complex Confirmed by Gerlene Fee [phone]) on 03/22/2020 11:45:35 AM      Labs Reviewed  CBC - Abnormal; Notable for the following components:      Result Value   Hemoglobin 10.5 (*)    HCT 33.1 (*)    MCH 25.7 (*)    All other components within normal limits  COMPREHENSIVE METABOLIC PANEL - Abnormal; Notable for the following components:   Potassium 5.5 (*)     Chloride 95 (*)    Glucose, Bld 69 (*)    BUN 62 (*)    Creatinine, Ser 12.35 (*)    Calcium 8.3 (*)    Albumin 2.7 (*)    GFR calc non Af Amer 3 (*)    GFR calc Af Amer 3 (*)    All other components within normal limits  PROTIME-INR - Abnormal; Notable for the following components:   Prothrombin Time 26.4 (*)    INR 2.5 (*)    All other components within normal limits  APTT - Abnormal; Notable for the following components:   aPTT 52 (*)  All other components within normal limits  BRAIN NATRIURETIC PEPTIDE - Abnormal; Notable for the following components:   B Natriuretic Peptide 1,141.1 (*)    All other components within normal limits  CBG MONITORING, ED - Abnormal; Notable for the following components:   Glucose-Capillary 25 (*)    All other components within normal limits  CBG MONITORING, ED - Abnormal; Notable for the following components:   Glucose-Capillary 117 (*)    All other components within normal limits  I-STAT VENOUS BLOOD GAS, ED - Abnormal; Notable for the following components:   pO2, Ven 25.0 (*)    Bicarbonate 30.1 (*)    Acid-Base Excess 4.0 (*)    Potassium 5.4 (*)    Calcium, Ion 1.00 (*)    HCT 33.0 (*)    Hemoglobin 11.2 (*)    All other components within normal limits  CBG MONITORING, ED - Abnormal; Notable for the following components:   Glucose-Capillary 139 (*)    All other components within normal limits  CBG MONITORING, ED - Abnormal; Notable for the following components:   Glucose-Capillary 126 (*)    All other components within normal limits  TROPONIN I (HIGH SENSITIVITY) - Abnormal; Notable for the following components:   Troponin I (High Sensitivity) 115 (*)    All other components within normal limits  RESPIRATORY PANEL BY RT PCR (FLU A&B, COVID)  LACTIC ACID, PLASMA  TROPONIN I (HIGH SENSITIVITY)    DG Chest Port 1 View  Final Result    CT Head Wo Contrast  Final Result    CT Angio Chest/Abd/Pel for Dissection W and/or Wo  Contrast    (Results Pending)    Medications  dextrose 50 % solution 50 mL (50 mLs Intravenous Given 03/22/20 1148)  heparin lock flush 100 unit/mL (has no administration in time range)  sodium chloride flush (NS) 0.9 % injection 10-40 mL (has no administration in time range)  Chlorhexidine Gluconate Cloth 2 % PADS 6 each (has no administration in time range)     Procedures  /  Critical Care .Critical Care Performed by: Maudie Flakes, MD Authorized by: Maudie Flakes, MD   Critical care provider statement:    Critical care time (minutes):  35   Critical care was necessary to treat or prevent imminent or life-threatening deterioration of the following conditions:  Metabolic crisis (Critically low blood sugar)   Critical care was time spent personally by me on the following activities:  Discussions with consultants, evaluation of patient's response to treatment, examination of patient, ordering and performing treatments and interventions, ordering and review of laboratory studies, ordering and review of radiographic studies, pulse oximetry, re-evaluation of patient's condition, obtaining history from patient or surrogate and review of old charts Ultrasound ED Peripheral IV (Provider)  Date/Time: 03/22/2020 11:47 AM Performed by: Maudie Flakes, MD Authorized by: Maudie Flakes, MD   Procedure details:    Indications: multiple failed IV attempts     Skin Prep: chlorhexidine gluconate     Location: Right upper arm.   Angiocath:  18 G   Bedside Ultrasound Guided: Yes     Patient tolerated procedure without complications: Yes     Dressing applied: Yes      ED Course and Medical Decision Making  I have reviewed the triage vital signs, the nursing notes, and pertinent available records from the EMR.  Listed above are laboratory and imaging tests that I personally ordered, reviewed, and interpreted and then considered in my medical  decision making (see below for details).  Patient  arrives somnolent with blood sugar of 25, no IV access, ultrasound IV quickly obtained.  Had received glucagon from EMS but does not seem to be helping.  D50 provided and patient is becoming more alert, still mildly confused.  Thinks that she fell.  Has missed some dialysis.  Awaiting i-STAT labs, will monitor closely.     Patient mental status continues to be improved, now she is conversant and seemingly at her baseline.  CT head is reassuring.  Patient continues to complain of moderate to severe epigastric pain and now chest pain.  Explains that this has been present persistently for 1 week.  Received a CT abdomen few days ago which was unremarkable.  With the worsening nature and radiation/progression into the chest will obtain CTA dissection study.  Still awaiting second troponin as well.  First troponin is over 100, BNP is elevated, chest x-ray with some evidence of edema.  Overall patient will need to be admitted and received dialysis.  Will wait on second troponin and dissection study to determine best primary team.  Dr. Posey Pronto of nephrology has been consulted and made aware of her dialysis needs on a nonurgent basis as she is not short of breath and on room air.  Signed out to oncoming provider at shift change.  Barth Kirks. [place]onia Small, [place] [email or web address]  Final Clinical Impressions(s) / ED Diagnoses     ICD-10-CM   1. Hypoglycemia  E16.2   2. Hypervolemia, unspecified hypervolemia type  E87.70   3. Chest pain, unspecified type  R07.9   4. Abdominal pain, unspecified abdominal location  R10.9     ED Discharge Orders    None       Discharge Instructions Discussed with and Provided to Patient:   Discharge Instructions   None       Maudie Flakes, MD 03/22/20 [phone]

## 2020-03-22 NOTE — ED Notes (Signed)
Admitting providers bedside 

## 2020-03-22 NOTE — ED Triage Notes (Signed)
Pt here from home today with c/o hypoglycemia , cbg 25 on arrival after 1mg  of glucagon given by ems , no IV access on arrival

## 2020-03-22 NOTE — H&P (Addendum)
[facility] Admission History and Physical Service Pager: [phone]  Patient name: Ashlee Miles Medical record number: [id number] Date of birth: 07/04/1940 Age: 79 y.o. Gender: female  Primary Care Provider: [name], MD Consultants: Nephrology Code Status: FULL   Chief Complaint: AMS  Assessment and Plan: Ashlee Miles is a 79 y.o. female presenting with altered mental status and hypoglycemia. 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  Altered mental status Patient presented with altered mental status and hypoglycemia with initial blood sugar of 20. CT head without acute abnormality. S/p glucagon by EMS and 2 amps of D50 in the ED.  Patient has a history of type 2 diabetes and takes NovoLog mix insulin at home (40 units in the morning and 30 units in the evening).  Hypoglycemia most likely a result of exogenous insulin administration in the setting of missed dialysis session..  Could also have underlying insulinoma, although rare.  Can consider further work-up if hypoglycemia persists .ABG wnl.  Given her altered mental status, will await SLP eval prior to restarting home p.o. medications and diet. - admit to FPTS, med-tele, Dr. Ardelia Mems attending - 1 amp D50 prn for hypoglycemia - CBG monitoring q1h - AM labs: CBC, renal function panel - SLP - PT/OT - NPO until cleared by SLP - vitals per routine - will need to get further history from daughter in AM  ESRD On MWF dialysis.  Apparently her last HD was on 9/29 which she only completed partially due to abdominal pain.  CXR with evidence of possible pulmonary edema secondary to missed dialysis vs. Multifocal PNA; however, patient breathing comfortably on room air.  Nephrology has been consulted, appreciate involvement. - plan for HD tomorrow - am Renal function panel  Epigastric/chest pain She has a history  of epigastric pain for over the past week.  PSHx includes cholecystectomy.  She was seen in the ED recently on 9/29 and had a CT of her abdomen which was overall unremarkable except for constipation.  She was discharged with hydrocodone-acetaminophen 5-325 mg and MiraLAX with plan to follow-up with PCP. CTA chest/abdomen/pelvis obtained in the ED did not reveal AAA or aortic dissection, but did reveal mild patchy opacities which could represent multifocal pneumonia or pulmonary edema. Pain could be a result of pneumonia causing pleuritic pain, so will treat and consider further evaluation if not improving. - 0.5 mg dilaudid q4h IV  Chronic pain syndrome Very little in chart regarding her chronic pain.  Does have rx for oxycodone 10mg  QID pRN by a dr Melina Fiddler -dilaudid 0.5mg  q4h prn  Pulmonary opacities Mild, diffuse interstitial pulmonary opacities as seen on CXR and CTA which may represent multifocal pneumonia or pulmonary edema in the setting of missed HD. No fever or leukocytosis to suggest pneumonia. Will treat given epigastric/chest pain. - CTX 1g (10/3-)  T2DM Home meds: Novolog 70/30 (40 units in the morning, 30 units in the evening). - holding insulin given hypoglycemia - q1h cbg w/ D50 PRN  HFpEF Most recent TTE 06/2017 with EF 60-65%, G1DD. Home meds: carvedilol 3.125 mg BID. - holding while npo  History of DVT/PE With history of protein C deficiency.  She has a history of IVC filter and is on warfarin 5 mg daily at home. INR 2.5 - holding warfarin until passes SLP eval  Depression On paroxetine 10 mg at home.  - restart home meds when able to take  PO  GERD Home meds: famotidine 20 mg daily, pantoprazole 40 mg BID. - IV protonix 40mg  BID - restart home meds when able to take PO  COPD Home meds: albuterol prn - continue albuterol prn  FEN/GI: NPO Prophylaxis: [place], restart warfarin when able  Disposition: med-tele  History of Present Illness:  Ashlee Miles is a  79 y.o. female presenting with AMS.  Per chart review, EMS was called for altered mental status and patient was found to have a blood sugar of 20.  She was given glucagon on route.  She then became more alert, but was still confused.  She believes that she fell and hit her head.  Due to persistent hypoglycemia, she was given 2 more amps of D50 in the ED.  On interview with the patient, she states she has been having epigastric pain radiating to the right side of her abdomen for the past week. Nothing makes it feel better.  Thinks her last dialysis was Friday. Stated several times "I take it on Friday."   Review Of Systems: Per HPI with the following additions: none  ROS unable to obtain due to pt mental status  Patient Active Problem List   Diagnosis Date Noted  . AMS (altered mental status) 03/22/2020  . Hypoglycemia 03/22/2020  . Dyspnea 02/12/2019  . Memory loss 03/25/2018  . Generalized weakness 10/31/2017  . Palliative care encounter 10/31/2017  . Leg wound, left 07/12/2017  . Cellulitis of left lower extremity 07/12/2017  . HLD (hyperlipidemia) 07/12/2017  . Chest pain in adult 06/22/2017  . Elevated troponin   . Altered mental status   . Diabetes mellitus with end stage renal disease ([place]) 05/31/2017  . Depression 05/31/2017  . GERD (gastroesophageal reflux disease) 05/31/2017  . Left groin pain 05/31/2017  . Protein C deficiency ([place])   . 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 Falls 04/25/2017  . Hypotension 04/25/2017  . Epigastric pain 04/25/2017  . Constipation 04/25/2017  . ESRD on dialysis ([place]) 04/06/2017  . Anemia due to chronic kidney disease 04/06/2017  . Thrombocytopenia ([place]) 04/06/2017  . Cough in adult 04/06/2017  . DVT (deep venous thrombosis) ([place]) 04/05/2017  . Pressure injury of skin 03/14/2017  . Acute metabolic encephalopathy 56/38/7564  . Skin lesion of hand 03/12/2017  . TIA (transient ischemic attack) 03/01/2017  . Syncope 03/01/2017  . Diabetes  mellitus with complication ([place]) 33/29/5188  . Essential hypertension 03/01/2017  . Chronic diastolic CHF (congestive heart failure) ([place]) 03/01/2017    Past Medical History: Past Medical History:  Diagnosis Date  . CHF (congestive heart failure) ([place])   . Diabetes mellitus without complication ([place])   . ESRD (end stage renal disease) on dialysis ([place])   . History of pituitary adenoma   . Hypertension   . Protein C deficiency [facility])     Past Surgical History: Past Surgical History:  Procedure Laterality Date  . AV FISTULA PLACEMENT Left 03/16/2017   Procedure: INSERTION OF ARTERIOVENOUS (AV) GORE-TEX GRAFT THIGH-LEFT;  Surgeon: Angelia Mould, MD;  Location: [place];  Service: Vascular;  Laterality: Left;  . CHOLECYSTECTOMY    . DIALYSIS FISTULA CREATION     clotted off  . FALSE ANEURYSM REPAIR Left 05/10/2017   Procedure: REPAIR FALSE ANEURYSM left thigh AVGG using Gore 11mmx5cm Viabahn Endoprosthesis;  Surgeon: Serafina Mitchell, MD;  Location: [facility] OR;  Service: Vascular;  Laterality: Left;  . I & D EXTREMITY Left 07/14/2017   Procedure: IRRIGATION AND DEBRIDEMENT DISTAL THIGH ABSCESS;  Surgeon: Scot Dock,  Judeth Cornfield, MD;  Location: [place];  Service: Vascular;  Laterality: Left;  . INSERTION OF DIALYSIS CATHETER Right 03/16/2017   Procedure: INSERTION OF RIGHT FEMORAL TUNNELED DIALYSIS CATHETER;  Surgeon: Angelia Mould, MD;  Location: [place];  Service: Vascular;  Laterality: Right;  . IR AV DIALY SHUNT INTRO [place] W/PTA/IMG LEFT  11/16/2017  . IR DIALY SHUNT INTRO NEEDLE/INTRACATH INITIAL W/IMG LEFT Left 01/10/2019  . IR THROMBECTOMY AV FISTULA W/THROMBOLYSIS/PTA INC/SHUNT/IMG LEFT Left 06/22/2017  . IR THROMBECTOMY AV FISTULA W/THROMBOLYSIS/PTA/STENT INC/SHUNT/IMG LT Left 03/30/2018  . IR US GUIDE VASC ACCESS LEFT  06/22/2017  . IR US GUIDE VASC ACCESS LEFT  03/30/2018  . IVC FILTER INSERTION    . PORTACATH PLACEMENT    . THROMBECTOMY AND REVISION OF  ARTERIOVENTOUS (AV) GORETEX  GRAFT Left 06/02/2017   Procedure: EVACUATION OF LEFT THIGH HEMATOMA  AND APPLICATION OF WOUND VAC;  Surgeon: Serafina Mitchell, MD;  Location: [facility] OR;  Service: Vascular;  Laterality: Left;    Social History: Social History   Tobacco Use  . Smoking status: Never Smoker  . Smokeless tobacco: Never Used  Vaping Use  . Vaping Use: Never used  Substance Use Topics  . Alcohol use: No  . Drug use: No   Additional social history: lives with daughter  Please also refer to relevant sections of EMR.  Family History: Family History  Problem Relation Age of Onset  . Breast cancer Mother   . Diabetes Sister   . Diabetes Brother   . CAD Other   . Stroke Neg Hx     Allergies and Medications: No Known Allergies No current facility-administered medications on file prior to encounter.   Current Outpatient Medications on File Prior to Encounter  Medication Sig Dispense Refill  . albuterol (PROVENTIL) (2.5 MG/3ML) 0.083% nebulizer solution Take 3 mLs (2.5 mg total) by nebulization every 4 (four) hours as needed for wheezing or shortness of breath. Diagnosis COPD J44.9 180 mL 4  . albuterol (VENTOLIN HFA) 108 (90 Base) MCG/ACT inhaler Inhale 2 puffs into the lungs every 4 (four) hours as needed for wheezing or shortness of breath.     . Amino Acids-Protein Hydrolys (FEEDING SUPPLEMENT, PRO-STAT SUGAR FREE 64,) LIQD Take 30 mLs by mouth 2 (two) times daily. 900 mL 0  . carvedilol (COREG) 3.125 MG tablet Take 3.125 mg by mouth 2 (two) times daily with a meal.     . cinacalcet (SENSIPAR) 30 MG tablet Take 30 mg by mouth 3 (three) times a week.     . collagenase (SANTYL) ointment Apply topically 2 (two) times daily. Apply to L thigh wound BID. Wet-to-dry dressing on top Santyl BID 15 g 0  . docusate sodium (COLACE) 100 MG capsule Take 1 capsule (100 mg total) by mouth 2 (two) times daily. 10 capsule 0  . famotidine (PEPCID) 20 MG tablet Take 1 tablet (20 mg total) daily  by mouth. 30 tablet 0  . gabapentin (NEURONTIN) 100 MG capsule Take 100 mg by mouth at bedtime.     Marland Kitchen HYDROcodone-acetaminophen (NORCO/VICODIN) 5-325 MG tablet Take 1 tablet by mouth every 6 (six) hours as needed for moderate pain. 20 tablet 0  . insulin aspart protamine- aspart (NOVOLOG MIX 70/30) (70-30) 100 UNIT/ML injection Inject 30-40 Units into the skin See admin instructions. Inject 40 units subcutaneously in the morning and 30 units in the evening or Sliding Scale    . lidocaine-prilocaine (EMLA) cream Apply 1 application topically as directed.     Marland Kitchen  midodrine (PROAMATINE) 10 MG tablet Take 1 tablet (10 mg total) by mouth every Monday, Wednesday, and Friday with hemodialysis. 30 tablet 0  . multivitamin (RENA-VIT) TABS tablet Take 1 tablet by mouth at bedtime. 30 tablet 0  . Oxycodone HCl 10 MG TABS Take 10 mg by mouth every 6 (six) hours as needed (pain).     . pantoprazole (PROTONIX) 40 MG tablet Take 1 tablet (40 mg total) 2 (two) times daily before a meal by mouth. 60 tablet 0  . PARoxetine (PAXIL) 10 MG tablet Take 1 tablet (10 mg total) daily by mouth. 30 tablet 0  . polyethylene glycol (MIRALAX / GLYCOLAX) 17 g packet Take 17 g by mouth daily. 14 each 0  . sevelamer carbonate (RENVELA) 800 MG tablet Take 800 mg by mouth 3 (three) times daily with meals.    . simvastatin (ZOCOR) 40 MG tablet Take 40 mg by mouth daily at 6 PM.    . sucralfate (CARAFATE) 1 GM/10ML suspension Take 10 mLs (1 g total) 4 (four) times daily -  with meals and at bedtime by mouth. 420 mL 0  . thiamine (VITAMIN B-1) 100 MG tablet Take 1 tablet (100 mg total) by mouth daily. 30 tablet 0  . warfarin (COUMADIN) 5 MG tablet Take 5 mg by mouth daily.     Marland Kitchen ZTLIDO 1.8 % PTCH Place 1 patch onto the skin every other day.    . BD PEN NEEDLE NANO U/F 32G X 4 MM MISC 3 (three) times daily.     . calcitRIOL (ROCALTROL) 0.5 MCG capsule Take 0.5 mcg by mouth daily.      Objective: BP (!) 117/43   Pulse (!) 163   Resp  20   SpO2 97%  Exam: General: Obese woman lying in bed, uncomfortable appearing, NAD Eyes: PERRL, small pupils but equal, EOMI ENTM: MM dry Neck: supple Cardiovascular: RRR, 2/6 systolic murmur best heard at the LUSB Respiratory: diffuse faint wheezes, no respiratory distress Gastrointestinal: soft, diffuse tenderness to percussion primarily epigastric MSK: moves all extremities.  3/5 lower extremities.  4/5 upper extremities.  Derm: no rash Neuro: alert, oriented x2 to self and location (knows she is in hospital), states year is 220, able to state birth year after long pause Psych: affect appropriate, speech difficult to understand  Labs and Imaging: CBC BMET  Recent Labs  Lab 03/22/20 1130 03/22/20 1130 03/22/20 1131  WBC 9.4  --   --   HGB 10.5*   < > 11.2*  HCT 33.1*   < > 33.0*  PLT 283  --   --    < > = values in this interval not displayed.   Recent Labs  Lab 03/22/20 1130 03/22/20 1130 03/22/20 1131  NA 136   < > 136  K 5.5*   < > 5.4*  CL 95*  --   --   CO2 26  --   --   BUN 62*  --   --   CREATININE 12.35*  --   --   GLUCOSE 69*  --   --   CALCIUM 8.3*  --   --    < > = values in this interval not displayed.     CT Head Wo Contrast  Result Date: 03/22/2020 CLINICAL DATA:  Hypoglycemia EXAM: CT HEAD WITHOUT CONTRAST TECHNIQUE: Contiguous axial images were obtained from the base of the skull through the vertex without intravenous contrast. COMPARISON:  June 22, 2017, March 02, 2017. FINDINGS: Brain:  No evidence of acute infarction, hemorrhage, hydrocephalus, extra-axial collection or mass lesion/mass effect. Vascular: No hyperdense vessel or unexpected calcification. Skull: Normal. Negative for fracture or focal lesion. Sinuses/Orbits: No acute finding. Unchanged LEFT mastoid effusion. Status post RIGHT cataract surgery. Other: None. IMPRESSION: No acute intracranial abnormality. Electronically Signed   By: Valentino Saxon MD   On: 03/22/2020 11:58   DG  Chest Port 1 View  Result Date: 03/22/2020 CLINICAL DATA:  Altered mental status EXAM: PORTABLE CHEST 1 VIEW COMPARISON:  12/11/2018 FINDINGS: Right chest port catheter. Mild cardiomegaly. Mild, diffuse interstitial pulmonary opacity and possible small pleural effusions. Visualized skeletal structures are unremarkable. IMPRESSION: Mild, diffuse interstitial pulmonary opacity and possible small pleural effusions. Mild cardiomegaly. Findings may reflect edema or infection. No focal airspace opacity. Electronically Signed   By: Eddie Candle M.D.   On: 03/22/2020 12:12   CT Angio Chest/Abd/Pel for Dissection W and/or Wo Contrast  Result Date: 03/22/2020 CLINICAL DATA:  Abdominal pain, evaluate for aortic dissection EXAM: CT ANGIOGRAPHY CHEST, ABDOMEN AND PELVIS TECHNIQUE: Non-contrast CT of the chest was initially obtained. Multidetector CT imaging through the chest, abdomen and pelvis was performed using the standard protocol during bolus administration of intravenous contrast. Multiplanar reconstructed images and MIPs were obtained and reviewed to evaluate the vascular anatomy. CONTRAST:  150mL OMNIPAQUE IOHEXOL 350 MG/ML SOLN COMPARISON:  CT abdomen/pelvis dated 03/19/2020. FINDINGS: CTA CHEST FINDINGS Cardiovascular: On unenhanced CT, there is no evidence of intramural hematoma. Following contrast administration, there is preferential opacification of the thoracic aorta. No evidence of thoracic aortic aneurysm or dissection. Atherosclerotic calcifications of the aortic arch. Although not tailored for evaluation of the pulmonary arteries, but there is no evidence of pulmonary embolism to the lobar level. The heart is normal in size.  No pericardial effusion. Right chest port terminates in the lower SVC. Mediastinum/Nodes: No suspicious mediastinal lymphadenopathy. Visualized thyroid is unremarkable. Lungs/Pleura: Mild patchy opacities in the posterior left upper lobe and bilateral lower lobes, left greater than  right, suspicious for multifocal pneumonia. Small left and trace right pleural effusions. No frank interstitial edema. Evaluation of the lung parenchyma is constrained by respiratory motion, but within that constraint, there are no suspicious pulmonary nodules. No pneumothorax. Musculoskeletal: Degenerative changes of the thoracic spine. Review of the MIP images confirms the above findings. CTA ABDOMEN AND PELVIS FINDINGS VASCULAR Aorta: No evidence abdominal aortic aneurysm or dissection. Atherosclerotic calcifications of the aortic arch. Celiac: Patent.  Mild atherosclerotic calcifications at the origin. SMA: Patent.  Mild atherosclerotic calcifications at the origin. Renals: Patent bilaterally, noting mild atherosclerotic calcifications. IMA: Patent. Inflow: Patent, with atherosclerotic calcifications bilaterally. Veins: IVC filter. Left femoral stent/graft. Otherwise grossly unremarkable. Review of the MIP images confirms the above findings. NON-VASCULAR Hepatobiliary: Liver is within normal limits. Status post cholecystectomy. No intrahepatic or extrahepatic ductal dilatation. Pancreas: Within normal limits. Spleen: Within normal limits. Adrenals/Urinary Tract: Adrenal glands are within normal limits. Bilateral renal cortical atrophy with two punctate nonobstructing left lower pole renal calculi (series 5/images [id number]). No hydronephrosis. Excretory contrast in the bladder. Stomach/Bowel: Stomach is within normal limits. No evidence of bowel obstruction. Normal appendix (series 5/image 197). Lymphatic: No suspicious abdominopelvic lymphadenopathy. Reproductive: Status post hysterectomy. Bilateral ovaries are unremarkable. Other: No abdominopelvic ascites. Musculoskeletal: Degenerative changes of the lumbar spine. Review of the MIP images confirms the above findings. IMPRESSION: No evidence of thoracoabdominal aortic aneurysm or dissection. Multifocal pneumonia, left greater than right. Small left and trace  right pleural effusions. Additional ancillary findings as above. Electronically  Signed   By: Julian Hy M.D.   On: 03/22/2020 15:23    Zola Button, MD 03/22/2020, 6:53 PM PGY-1, [place] Intern pager: [phone], text pages welcome   Resident Addendum I have separately seen and examined the patient.  I have discussed the findings and exam with the resident and agree with the above note.  I helped develop the management plan that is described in the residentsnote and I agree with the content.  Changes have been made in BLUE.    Addison Naegeli, MD PGY-2 [facility] residency program

## 2020-03-23 DIAGNOSIS — R4182 Altered mental status, unspecified: Secondary | ICD-10-CM

## 2020-03-23 LAB — CBC
HCT: 26.5 % — ABNORMAL LOW (ref 36.0–46.0)
Hemoglobin: 8.7 g/dL — ABNORMAL LOW (ref 12.0–15.0)
MCH: 26.1 pg (ref 26.0–34.0)
MCHC: 32.8 g/dL (ref 30.0–36.0)
MCV: 79.6 fL — ABNORMAL LOW (ref 80.0–100.0)
Platelets: 269 10*3/uL (ref 150–400)
RBC: 3.33 MIL/uL — ABNORMAL LOW (ref 3.87–5.11)
RDW: 14.8 % (ref 11.5–15.5)
WBC: 9.5 10*3/uL (ref 4.0–10.5)
nRBC: 0 % (ref 0.0–0.2)

## 2020-03-23 LAB — RENAL FUNCTION PANEL
Albumin: 2.4 g/dL — ABNORMAL LOW (ref 3.5–5.0)
Anion gap: 13 (ref 5–15)
BUN: 70 mg/dL — ABNORMAL HIGH (ref 8–23)
CO2: 26 mmol/L (ref 22–32)
Calcium: 8.1 mg/dL — ABNORMAL LOW (ref 8.9–10.3)
Chloride: 96 mmol/L — ABNORMAL LOW (ref 98–111)
Creatinine, Ser: 13.8 mg/dL — ABNORMAL HIGH (ref 0.44–1.00)
GFR calc Af Amer: 3 mL/min — ABNORMAL LOW (ref >60)
GFR calc non Af Amer: 2 mL/min — ABNORMAL LOW (ref >60)
Glucose, Bld: 82 mg/dL (ref 70–99)
Phosphorus: 7 mg/dL — ABNORMAL HIGH (ref 2.5–4.6)
Potassium: 5.5 mmol/L — ABNORMAL HIGH (ref 3.5–5.1)
Sodium: 135 mmol/L (ref 135–145)

## 2020-03-23 LAB — GLUCOSE, CAPILLARY
Glucose-Capillary: 85 mg/dL (ref 70–99)
Glucose-Capillary: 150 mg/dL — ABNORMAL HIGH (ref 70–99)
Glucose-Capillary: 95 mg/dL (ref 70–99)
Glucose-Capillary: 100 mg/dL — ABNORMAL HIGH (ref 70–99)
Glucose-Capillary: 88 mg/dL (ref 70–99)
Glucose-Capillary: 90 mg/dL (ref 70–99)
Glucose-Capillary: 93 mg/dL (ref 70–99)
Glucose-Capillary: 82 mg/dL (ref 70–99)
Glucose-Capillary: 90 mg/dL (ref 70–99)
Glucose-Capillary: 190 mg/dL — ABNORMAL HIGH (ref 70–99)
Glucose-Capillary: 155 mg/dL — ABNORMAL HIGH (ref 70–99)

## 2020-03-23 LAB — PROTIME-INR
INR: 2.8 — ABNORMAL HIGH (ref 0.8–1.2)
Prothrombin Time: 28.6 seconds — ABNORMAL HIGH (ref 11.4–15.2)

## 2020-03-23 MED ORDER — POLYETHYLENE GLYCOL 3350 17 G PO PACK
17.0000 g | PACK | Freq: Two times a day (BID) | ORAL | Status: DC | PRN
  Filled 2020-03-23 (×2): qty 1

## 2020-03-23 MED ORDER — CHLORHEXIDINE GLUCONATE CLOTH 2 % EX PADS
6.0000 | MEDICATED_PAD | Freq: Every morning | CUTANEOUS | Status: DC
  Administered 2020-03-24 – 2020-03-25 (×2): 6 via TOPICAL

## 2020-03-23 MED ORDER — LIDOCAINE HCL (PF) 1 % IJ SOLN: 5.0000 mL | INTRAMUSCULAR | Status: DC | PRN

## 2020-03-23 MED ORDER — LIDOCAINE 5 % EX PTCH
2.0000 | MEDICATED_PATCH | CUTANEOUS | Status: DC
  Administered 2020-03-24 – 2020-03-25 (×2): 2 via TRANSDERMAL
  Filled 2020-03-23 (×3): qty 2

## 2020-03-23 MED ORDER — HEPARIN SODIUM (PORCINE) 1000 UNIT/ML DIALYSIS: 1000.0000 [IU] | INTRAMUSCULAR | Status: DC | PRN

## 2020-03-23 MED ORDER — SODIUM CHLORIDE 0.9 % IV SOLN: 100.0000 mL | INTRAVENOUS | Status: DC | PRN

## 2020-03-23 MED ORDER — SODIUM CHLORIDE 0.9 % IV SOLN
INTRAVENOUS | Status: DC | PRN
  Administered 2020-03-23: 250 mL via INTRAVENOUS

## 2020-03-23 MED ORDER — LIDOCAINE-PRILOCAINE 2.5-2.5 % EX CREA: 1.0000 "application " | TOPICAL_CREAM | CUTANEOUS | Status: DC | PRN

## 2020-03-23 MED ORDER — ALTEPLASE 2 MG IJ SOLR: 2.0000 mg | Freq: Once | INTRAMUSCULAR | Status: DC | PRN

## 2020-03-23 MED ORDER — PENTAFLUOROPROP-TETRAFLUOROETH EX AERO: 1.0000 "application " | INHALATION_SPRAY | CUTANEOUS | Status: DC | PRN

## 2020-03-23 MED ORDER — SODIUM CHLORIDE 0.9 % IV SOLN
500.0000 mg | INTRAVENOUS | Status: AC
  Administered 2020-03-23 – 2020-03-25 (×3): 500 mg via INTRAVENOUS
  Filled 2020-03-23 (×3): qty 500

## 2020-03-23 NOTE — Progress Notes (Signed)
PT Cancellation Note  Patient Details Name: Ashlee Miles MRN: [id number] DOB: Apr 15, 1941   Cancelled Treatment:    Reason Eval/Treat Not Completed: Patient at procedure or test/unavailable Pt off floor at HD. Will follow.   [name] 03/23/2020, 7:16 AM Marisa Severin, PT, DPT Acute Rehabilitation Services Pager [phone] Office [phone]

## 2020-03-23 NOTE — Progress Notes (Signed)
[facility] Progress Note   Subjective:  Seen in HD unit. UF goal 3L, tolerating UF. She is alert and oriented this am.  Denies SOB, but continued epigastric/CP.   Objective Vitals:   03/23/20 1053 03/23/20 1105 03/23/20 1108 03/23/20 1123  BP:      Pulse: 73 75 75 78  Resp: 15 (!) 21 16 18   Temp:      TempSrc:      SpO2: 99% 99% 98% 99%  Weight:         Additional Objective Labs: Basic Metabolic Panel: Recent Labs  Lab 03/19/20 1006 03/19/20 1006 03/22/20 1130 03/22/20 1131 03/23/20 0900  NA 136   < > 136 136 135  K 4.4   < > 5.5* 5.4* 5.5*  CL 95*  --  95*  --  96*  CO2 27  --  26  --  26  GLUCOSE 99  --  69*  --  82  BUN 31*  --  62*  --  70*  CREATININE 7.86*  --  12.35*  --  13.80*  CALCIUM 9.0  --  8.3*  --  8.1*  PHOS  --   --   --   --  7.0*   < > = values in this interval not displayed.   CBC: Recent Labs  Lab 03/19/20 1055 03/19/20 1055 03/22/20 1130 03/22/20 1131 03/23/20 0900  WBC 7.9  --  9.4  --  9.5  NEUTROABS 5.3  --   --   --   --   HGB 9.8*   < > 10.5* 11.2* 8.7*  HCT 31.0*   < > 33.1* 33.0* 26.5*  MCV 82.9  --  80.9  --  79.6*  PLT 188  --  283  --  269   < > = values in this interval not displayed.   Blood Culture    Component Value Date/Time   SDES URINE, RANDOM 03/19/2020 1021   SPECREQUEST  03/19/2020 1021    NONE Performed at [facility], [place] [address], [place], [place] [address]    CULT MULTIPLE SPECIES PRESENT, SUGGEST RECOLLECTION (A) 03/19/2020 1021   REPTSTATUS 03/20/2020 FINAL 03/19/2020 1021     Physical Exam General: WNWD elderly woman, nad  Heart: RRR Lungs: Clear, bilaterally  Abdomen: soft non-tender  Extremities: No sig LE edema  Dialysis Access: L thigh AVG in use on dialysis   Medications: . sodium chloride    . sodium chloride    . cefTRIAXone (ROCEPHIN)  IV Stopped (03/22/20 2109)   . Chlorhexidine Gluconate Cloth  6 each Topical q morning - 10a  . collagenase   Topical  BID  . heparin  5,000 Units Subcutaneous Q8H  . heparin lock flush  500 Units Intracatheter Once  . lidocaine  1 patch Transdermal Q48H  . lidocaine-prilocaine  1 application Topical UD  . pantoprazole (PROTONIX) IV  40 mg Intravenous Q12H    Dialysis Orders:  MWF at [facility] --> last HD 9/29, only ran 1:46hr of 4hr, but left below EDW 4hr, 400/800, EDW 92.3kg, 2K/2.5Ca, AVG, heparin 3000 unit bolus - Hectoral 35mcg IV q HD - Mircera 70mcg IV q 4 weeks (last 9/15) - Home meds: Sensipar 30mg  on MWF, Renvela 2/meals.  Assessment/Plan: 1. Hypoglycemia: Unclear etiology -- low intake v. insulin misuse v. brewing infection. Per hospitalist. 2. AMS: Felt d/t #1 -- Appears improved today.  3. Epigastric v. chest pain: No acute abdominal issues on  9/30 CT -- CTA chest 10/3 Concerning for multifocal pneumonia  Trop - high, trending. EKG without ST changes. Will see if improves following dialysis today.  4. ESRD: HD MWF. missed her last HD. CXR is wet but she looks fairly comfortable. HD today on schedule.   5. Hypertension/volume: BP low this am.  See above, UF as tolerated with next HD - EDW may need to be lowered. 6.  Anemia: Hgb. 8.7. Last ESA 9/15. Resume with next HD if Hgb remains <9.  7.  Metabolic bone disease: Ca ok. Restart home binder (Renvela) when eating. 8. T2DM: See #1. On low dose Lantus at home - holding here. Per primary. 9. 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  Lynnda Child PA-C [facility] 03/23/2020,11:26 AM  LOS: 1 day

## 2020-03-23 NOTE — TOC Initial Note (Signed)
Transition of Care [facility]) - Initial/Assessment Note    Patient Details  Name: Ashlee Miles MRN: [id number] Date of Birth: 17-Oct-1940  Transition of Care [facility]) CM/SW Contact:    Trula Ore, [place] Phone Number: 03/23/2020, 5:32 PM  Clinical Narrative:                  CSW received consult for possible SNF placement at time of discharge. CSW spoke with patients daughter [name] regarding PT recommendation of SNF placement at time of discharge. Patients daughter [name] expressed understanding of PT recommendation and is agreeable to SNF placement at time of discharge.Patient comes from home and lives with daughter angela, son-n-law, and 75 year old grandson. Patients daughter gave CSW permission to fax out initial referral to [place] area.Patients daughter [name] reports preference for [facility] as number one SNF choice Patient has received the COVID vaccines. No further questions reported at this time. CSW to continue to follow and assist with discharge planning needs.  Expected Discharge Plan: Skilled Nursing Facility Barriers to Discharge: Continued Medical Work up   Patient Goals and CMS Choice     Choice offered to / list presented to : Adult Children [name] daughter)  Expected Discharge Plan and Services Expected Discharge Plan: [place] arrangements for the past 2 months: Single Family Home                                      Prior Living Arrangements/Services Living arrangements for the past 2 months: Single Family Home Lives with:: Self, Adult Children, Other (Comment) (Lives with daughter [name], daughters husband and her grandson) Patient language and need for interpreter reviewed:: Yes Do you feel safe going back to the place where you live?: No   SNF  Need for Family Participation in Patient Care: Yes (Comment) Care giver support system in place?: Yes (comment)   Criminal Activity/Legal Involvement  Pertinent to Current Situation/Hospitalization: No - Comment as needed  Activities of Daily Living      Permission Sought/Granted Permission sought to share information with : Case Manager, Family Supports, Customer service manager Permission granted to share information with : Yes, Verbal Permission Granted  Share Information with NAME: [name]  Permission granted to share info w AGENCY: SNF  Permission granted to share info w Relationship: daughter  Permission granted to share info w Contact Information: [name] [phone]  Emotional Assessment       Orientation: : Oriented to Self Alcohol / Substance Use: Not Applicable Psych Involvement: No (comment)  Admission diagnosis:  Hypoglycemia [E16.2] Abdominal pain, unspecified abdominal location [R10.9] Hypervolemia, unspecified hypervolemia type [E87.70] Chest pain, unspecified type [R07.9] AMS (altered mental status) [R41.82] Patient Active Problem List   Diagnosis Date Noted  . AMS (altered mental status) 03/22/2020  . Hypoglycemia 03/22/2020  . Dyspnea 02/12/2019  . Memory loss 03/25/2018  . Generalized weakness 10/31/2017  . Palliative care encounter 10/31/2017  . Leg wound, left 07/12/2017  . Cellulitis of left lower extremity 07/12/2017  . HLD (hyperlipidemia) 07/12/2017  . Chest pain in adult 06/22/2017  . Elevated troponin   . Altered mental status   . 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 Depression 05/31/2017  . GERD (gastroesophageal reflux disease) 05/31/2017  . Left groin pain 05/31/2017  . Protein C deficiency ([place])   . 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 Falls  04/25/2017  . Hypotension 04/25/2017  . Epigastric pain 04/25/2017  . Constipation 04/25/2017  . ESRD on dialysis ([place]) 04/06/2017  . Anemia due to chronic kidney disease 04/06/2017  . Thrombocytopenia ([place]) 04/06/2017  . Cough in adult 04/06/2017  . DVT (deep venous thrombosis) ([place]) 04/05/2017  . Pressure  injury of skin 03/14/2017  . Acute metabolic encephalopathy 79/15/0413  . Skin lesion of hand 03/12/2017  . TIA (transient ischemic attack) 03/01/2017  . Syncope 03/01/2017  . Diabetes mellitus with complication ([place]) 64/38/3779  . Essential hypertension 03/01/2017  . Chronic diastolic CHF (congestive heart failure) ([place]) 03/01/2017   PCP:  [name], MD Pharmacy:   [id number] [place], [place] [address] Phone: [phone] Fax: [phone]  [facility] [place], [place] - [address] [employer] [address], [address] [place] [address] Phone: [phone] Fax: [phone]  [place], [place] [address] [place] [address] Phone: [phone] Fax: [phone]     Social Determinants of Health (SDOH) Interventions    Readmission Risk Interventions No flowsheet data found.

## 2020-03-23 NOTE — Evaluation (Signed)
Physical Therapy Evaluation Patient Details Name: Ashlee Miles MRN: [id number] DOB: 12-21-1940 Today's Date: 03/23/2020   History of Present Illness  Patient is a 79 y/o female who presents with AMS, abdominal pain and fall with a blood sugar of 20. Admitted with hypoglycemia. CXR- pulmonary edema secondary to missed dialysis vs multifocal PNA. PMH includes HTN, protein C deficiency, pituitary adenoma, DVT, ESRD on HD, CHF, DM, IVC filter, thrombectomy  Clinical Impression  Patient presents with generalized weakness, confusion, pain and impaired mobility s/p above. Pt not the best historian but reports being Mod I with rollator and lives with daughter PTA. Today, pt oriented to name only, difficulty stating bday needing contextual cues but able to state Month/year correctly with increased time. Poor STM noted even after only a few minutes. Performed evaluation at bed level due to pain and "not feeling well." Requires max A to roll to right/left with cues to reach for rail. Declined sitting EOB.  Likely will need second person for safe OOB mobility. Would benefit from SNF to maximize independence and mobility prior to return home. Will follow acutely.    Follow Up Recommendations SNF;Supervision for mobility/OOB    Equipment Recommendations  Other (comment) (defer to post acute)    Recommendations for Other Services       Precautions / Restrictions Precautions Precautions: Fall Restrictions Weight Bearing Restrictions: No      Mobility  Bed Mobility Overal bed mobility: Needs Assistance Bed Mobility: Rolling Rolling: Max assist         General bed mobility comments: Assist to roll to right/left with cues to reach for rail; increased time/effort. Declined EOB or OOB.  Transfers                 General transfer comment: Declined due topain, not feeling well.  Ambulation/Gait                Stairs            Wheelchair Mobility    Modified Rankin  (Stroke Patients Only)       Balance                                             Pertinent Vitals/Pain Pain Assessment: Faces Faces Pain Scale: Hurts a little bit Pain Location: abdomen Pain Descriptors / Indicators: Aching Pain Intervention(s): Monitored during session;Limited activity within patient's tolerance    Home Living Family/patient expects to be discharged to:: Skilled nursing facility Living Arrangements: Children (daughter) Available Help at Discharge: Family Type of Home: House Home Access: Stairs to enter Entrance Stairs-Rails: Psychiatric nurse of Steps: 4 Home Layout: One level Home Equipment: Environmental consultant - 4 wheels;Shower seat;Toilet riser      Prior Function Level of Independence: Independent with assistive device(s)         Comments: Uses rollator for ambulation; doing own ADLs. Daughter does IADLs.     Hand Dominance   Dominant Hand: Right    Extremity/Trunk Assessment   Upper Extremity Assessment Upper Extremity Assessment: Defer to OT evaluation    Lower Extremity Assessment Lower Extremity Assessment: Generalized weakness;Difficult to assess due to impaired cognition (Limited knee flexion AROM, limited movement due to pain)       Communication   Communication: No difficulties  Cognition Arousal/Alertness: Awake/alert Behavior During Therapy: WFL for tasks assessed/performed Overall Cognitive Status: Impaired/Different from baseline Area of  Impairment: Orientation;Memory;Awareness;Problem solving;Following commands                 Orientation Level: Disoriented to;Place;Situation   Memory: Decreased short-term memory Following Commands: Follows one step commands with increased time   Awareness: Intellectual Problem Solving: Slow processing;Requires verbal cues;Decreased initiation General Comments: Difficulty stating birthday needing contextual cues. Thinks she is in "[place]." Poor STM even  after a few minutes. Knows it is "October 2021"Confused.  Does not beileve she is at [facility].      General Comments General comments (skin integrity, edema, etc.): VSS on RA.    Exercises     Assessment/Plan    PT Assessment Patient needs continued PT services  PT Problem List Decreased strength;Decreased mobility;Decreased safety awareness;Obesity;Decreased activity tolerance;Decreased cognition;Pain;Decreased balance       PT Treatment Interventions Therapeutic activities;Gait training;Therapeutic exercise;Patient/family education;Wheelchair mobility training;Balance training;Functional mobility training    PT Goals (Current goals can be found in the Care Plan section)  Acute Rehab PT Goals Patient Stated Goal: to get some rest PT Goal Formulation: Patient unable to participate in goal setting Time For Goal Achievement: 04/06/20 Potential to Achieve Goals: Fair    Frequency Min 2X/week   Barriers to discharge   unsure    Co-evaluation               AM-PAC PT "6 Clicks" Mobility  Outcome Measure Help needed turning from your back to your side while in a flat bed without using bedrails?: Total Help needed moving from lying on your back to sitting on the side of a flat bed without using bedrails?: Total Help needed moving to and from a bed to a chair (including a wheelchair)?: Total Help needed standing up from a chair using your arms (e.g., wheelchair or bedside chair)?: Total Help needed to walk in hospital room?: Total Help needed climbing 3-5 steps with a railing? : Total 6 Click Score: 6    End of Session   Activity Tolerance: Patient limited by pain Patient left: in bed;with call bell/phone within reach;with bed alarm set Nurse Communication: Mobility status;Need for lift equipment PT Visit Diagnosis: Pain;Muscle weakness (generalized) (M62.81);Difficulty in walking, not elsewhere classified (R26.2) Pain - part of body:  (abdomen)    Time:  1400-1416 PT Time Calculation (min) (ACUTE ONLY): 16 min   Charges:   PT Evaluation $PT Eval Moderate Complexity: 1 Mod          Marisa Severin, PT, DPT Acute Rehabilitation Services Pager [phone] Office [phone]      Ashlee Miles 03/23/2020, 3:20 PM

## 2020-03-23 NOTE — Progress Notes (Signed)
Spoke to patients daughter, [name]. She is the [place] for healthcare. Patient does not have a legal guardian. FYI message deleted that designated patient had a legal guardian.

## 2020-03-23 NOTE — Progress Notes (Addendum)
Family Medicine Teaching Service Daily Progress Note Intern Pager: [phone]  Patient name: Ashlee Miles Medical record number: [id number] Date of birth: 1941-01-14 Age: 79 y.o. Gender: female  Primary Care Provider: [name], MD Consultants: Nephrology  Code Status: Full  Pt Overview and Major Events to Date:  Admitted 10/3  Assessment and Plan: Ashlee Miles is a 79 y.o. female presenting with altered mental status and hypoglycemia. 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  Altered mental status Alert and oriented to person, place and stated the year is 2020. Able to tell me her daughter's name. Patient initially presented with altered mental status and hypoglycemia with initial blood sugar of 20. Likely multifactorial, contributing factors including hypoglycemia and in the setting of already having prior gradual decline in memory loss. CT head without acute abnormality. S/p glucagon by EMS and 2 amps of D50 in the ED.  Patient has a history of type 2 diabetes and takes NovoLog mix insulin at home (40 units in the morning and 30 units in the evening).  Hypoglycemia most likely a result of exogenous insulin administration in the setting of missed dialysis session. Could also have underlying insulinoma, although rare.  Can consider further work-up if hypoglycemia persists .ABG wnl.  Given her altered mental status, will await SLP eval prior to restarting home p.o. medications and diet. CBGs beginning to gradually normalize, 150>95>100>88>90. - 1 amp D50 prn for hypoglycemia - continue CBG monitoring q1h - awaiting morning CBC - SLP, NPO until evaluated by SLP - PT/OT - vitals per routine   ESRD On MWF dialysis.  Apparently her last HD was on 9/29 which she only completed partially due to abdominal pain.  CXR with evidence of possible pulmonary edema secondary to missed dialysis vs.  Multifocal PNA; however, patient breathing comfortably on room air.  Nephrology has been consulted, appreciate involvement. - HD session today - awaiting Renal function panel  Epigastric pain Denies epigastric pain this morning. She has a history of epigastric pain for over the past week.  PSHx includes cholecystectomy.  She was seen in the ED recently on 9/29 and had a CT of her abdomen which was overall unremarkable except for constipation.  She was discharged with hydrocodone-acetaminophen 5-325 mg and MiraLAX with plan to follow-up with PCP. CTA chest/abdomen/pelvis obtained in the ED did not reveal AAA or aortic dissection, but did reveal mild patchy opacities which could represent multifocal pneumonia or pulmonary edema. Pain could be a result of pneumonia causing pleuritic pain, so will treat and consider further evaluation if not improving. - IV dilaudid 0.5 mg q4h  Chronic pain syndrome Very little in chart regarding her chronic pain.  Does have rx for oxycodone 10mg  QID pRN by a dr Melina Fiddler -dilaudid 0.5mg  q4h prn  Pulmonary opacities Mild, diffuse interstitial pulmonary opacities as seen on CXR and CTA which may represent multifocal pneumonia or pulmonary edema in the setting of missed HD. No fever or leukocytosis to suggest pneumonia. Will treat given epigastric/chest pain. - Day 2 of ceftriaxone 1g (10/3-)  T2DM Home meds: Novolog 70/30 (40 units in the morning, 30 units in the evening). - holding insulin given hypoglycemia - hourly CBG -IVF D10 40 cc/hr for 10 hrs -D50 prn for low blood sugars  HFpEF Most recent TTE 06/2017 with EF 60-65%, G1DD. Home meds: carvedilol 3.125 mg BID. - holding while npo  History of DVT/PE 2+ right LE and  1+ left LE edema on exam this morning. With history of protein C deficiency.  She has a history of IVC filter and is on warfarin 5 mg daily at home. PT-INR elevated respectively at 26.4 and 2.5. - holding warfarin until passes SLP  eval  Depression On paroxetine 10 mg at home.  - restart home meds when able to take PO  GERD Home meds: famotidine 20 mg daily, pantoprazole 40 mg BID. - IV protonix 40mg  BID - restart home meds when able to take PO  COPD Home meds: albuterol prn - continue albuterol prn  FEN/GI: NPO PPx: subq heparin    Status is: Inpatient  Remains inpatient appropriate because:Altered mental status   Dispo: The patient is from: Home              Anticipated d/c is to: Home              Anticipated d/c date is: 2 days              Patient currently is not medically stable to d/c.        Subjective:  Patient seen while in HD this morning. She denies weakness, dizziness or blurry vision. States that we can call her daughter, Ashlee Miles for further details or questions if needed. I plan to call daughter later today to get a better idea of patient's baseline neurological status as well as what specifically led to this hospitalization. Complains that she is thirsty but remains NPO for SLP evaluation.   Objective: Temp:  [98.3 F (36.8 C)-98.8 F (37.1 C)] 98.6 F (37 C) (10/04 0436) Pulse Rate:  [68-163] 68 (10/04 0436) Resp:  [10-22] 14 (10/04 0436) BP: (106-162)/(25-137) 120/48 (10/04 0436) SpO2:  [97 %-100 %] 100 % (10/04 0436) Weight:  [94.7 kg] 94.7 kg (10/04 0436) Physical Exam: General: Patient laying comfortably in bed, in no acute distress. Cardiovascular: RRR, no murmurs or gallops auscultated  Respiratory: lungs clear to auscultation bilaterally  Abdomen: soft, nontender, presence of active bowel sounds Extremities: radial and distal pulses intact bilaterally, no pedal edema noted, 2+ right LE and 1+ left LE pitting edema noted  Neuro: AOx2, follow commands appropriately  Psych: mood appropriate   Laboratory: Recent Labs  Lab 03/19/20 1055 03/22/20 1130 03/22/20 1131  WBC 7.9 9.4  --   HGB 9.8* 10.5* 11.2*  HCT 31.0* 33.1* 33.0*  PLT 188 283  --    Recent  Labs  Lab 03/19/20 1006 03/22/20 1130 03/22/20 1131  NA 136 136 136  K 4.4 5.5* 5.4*  CL 95* 95*  --   CO2 27 26  --   BUN 31* 62*  --   CREATININE 7.86* 12.35*  --   CALCIUM 9.0 8.3*  --   PROT 6.8 6.6  --   BILITOT 0.8 0.8  --   ALKPHOS 48 49  --   ALT 8 8  --   AST 14* 16  --   GLUCOSE 99 69*  --       Imaging/Diagnostic Tests: CT Head Wo Contrast  Result Date: 03/22/2020 CLINICAL DATA:  Hypoglycemia EXAM: CT HEAD WITHOUT CONTRAST TECHNIQUE: Contiguous axial images were obtained from the base of the skull through the vertex without intravenous contrast. COMPARISON:  June 22, 2017, March 02, 2017. FINDINGS: Brain: No evidence of acute infarction, hemorrhage, hydrocephalus, extra-axial collection or mass lesion/mass effect. Vascular: No hyperdense vessel or unexpected calcification. Skull: Normal. Negative for fracture or focal lesion. Sinuses/Orbits: No acute finding.  Unchanged LEFT mastoid effusion. Status post RIGHT cataract surgery. Other: None. IMPRESSION: No acute intracranial abnormality. Electronically Signed   By: Valentino Saxon MD   On: 03/22/2020 11:58   DG Chest Port 1 View  Result Date: 03/22/2020 CLINICAL DATA:  Altered mental status EXAM: PORTABLE CHEST 1 VIEW COMPARISON:  12/11/2018 FINDINGS: Right chest port catheter. Mild cardiomegaly. Mild, diffuse interstitial pulmonary opacity and possible small pleural effusions. Visualized skeletal structures are unremarkable. IMPRESSION: Mild, diffuse interstitial pulmonary opacity and possible small pleural effusions. Mild cardiomegaly. Findings may reflect edema or infection. No focal airspace opacity. Electronically Signed   By: Eddie Candle M.D.   On: 03/22/2020 12:12   CT Angio Chest/Abd/Pel for Dissection W and/or Wo Contrast  Result Date: 03/22/2020 CLINICAL DATA:  Abdominal pain, evaluate for aortic dissection EXAM: CT ANGIOGRAPHY CHEST, ABDOMEN AND PELVIS TECHNIQUE: Non-contrast CT of the chest was  initially obtained. Multidetector CT imaging through the chest, abdomen and pelvis was performed using the standard protocol during bolus administration of intravenous contrast. Multiplanar reconstructed images and MIPs were obtained and reviewed to evaluate the vascular anatomy. CONTRAST:  139mL OMNIPAQUE IOHEXOL 350 MG/ML SOLN COMPARISON:  CT abdomen/pelvis dated 03/19/2020. FINDINGS: CTA CHEST FINDINGS Cardiovascular: On unenhanced CT, there is no evidence of intramural hematoma. Following contrast administration, there is preferential opacification of the thoracic aorta. No evidence of thoracic aortic aneurysm or dissection. Atherosclerotic calcifications of the aortic arch. Although not tailored for evaluation of the pulmonary arteries, but there is no evidence of pulmonary embolism to the lobar level. The heart is normal in size.  No pericardial effusion. Right chest port terminates in the lower SVC. Mediastinum/Nodes: No suspicious mediastinal lymphadenopathy. Visualized thyroid is unremarkable. Lungs/Pleura: Mild patchy opacities in the posterior left upper lobe and bilateral lower lobes, left greater than right, suspicious for multifocal pneumonia. Small left and trace right pleural effusions. No frank interstitial edema. Evaluation of the lung parenchyma is constrained by respiratory motion, but within that constraint, there are no suspicious pulmonary nodules. No pneumothorax. Musculoskeletal: Degenerative changes of the thoracic spine. Review of the MIP images confirms the above findings. CTA ABDOMEN AND PELVIS FINDINGS VASCULAR Aorta: No evidence abdominal aortic aneurysm or dissection. Atherosclerotic calcifications of the aortic arch. Celiac: Patent.  Mild atherosclerotic calcifications at the origin. SMA: Patent.  Mild atherosclerotic calcifications at the origin. Renals: Patent bilaterally, noting mild atherosclerotic calcifications. IMA: Patent. Inflow: Patent, with atherosclerotic calcifications  bilaterally. Veins: IVC filter. Left femoral stent/graft. Otherwise grossly unremarkable. Review of the MIP images confirms the above findings. NON-VASCULAR Hepatobiliary: Liver is within normal limits. Status post cholecystectomy. No intrahepatic or extrahepatic ductal dilatation. Pancreas: Within normal limits. Spleen: Within normal limits. Adrenals/Urinary Tract: Adrenal glands are within normal limits. Bilateral renal cortical atrophy with two punctate nonobstructing left lower pole renal calculi (series 5/images [id number]). No hydronephrosis. Excretory contrast in the bladder. Stomach/Bowel: Stomach is within normal limits. No evidence of bowel obstruction. Normal appendix (series 5/image 197). Lymphatic: No suspicious abdominopelvic lymphadenopathy. Reproductive: Status post hysterectomy. Bilateral ovaries are unremarkable. Other: No abdominopelvic ascites. Musculoskeletal: Degenerative changes of the lumbar spine. Review of the MIP images confirms the above findings. IMPRESSION: No evidence of thoracoabdominal aortic aneurysm or dissection. Multifocal pneumonia, left greater than right. Small left and trace right pleural effusions. Additional ancillary findings as above. Electronically Signed   By: Julian Hy M.D.   On: 03/22/2020 15:23    Donney Dice, DO 03/23/2020, 7:30 AM PGY-1, [place] Intern pager: [phone], text pages  welcome

## 2020-03-23 NOTE — Progress Notes (Signed)
Received consult to assess port a cath dressing. Upon arrival to room, there was no dressing over port. Site cleaned well with CHG. Biopatch applied and dressing placed. Port flushes well with good blood return. RN made aware.

## 2020-03-23 NOTE — Progress Notes (Addendum)
FPTS Interim Progress Note  Spoke to patient's daughter, [name], over the phone. She shares that patient lives with daughter and daughter's husband. Ambulates with assistance using a walker and cane for 4-5 years but has also been using a wheelchair in the past 2 years. Daughter states that patient used to be able to do all ADLs except needed help bathing, has an aid that comes to help with this. Daughter has noticed a gradual decline in memory about 1-2 months ago, when patient started to forget that she ate a meal. Then noticed a more rapid decline in mentation and more confusion in the past week. According to daughter, it is not patient's baseline that she is only alert and oriented to person, which she has been for the most part. She began to forget to take her medications, either daughter or daughter's husband gives her insulin daily. Denies any recent falls or injuries, last fall was about a year ago. Patient has chronic back pain secondary to history of degenerative disc disease. Daughter has also noticed patient experiencing depression more often due to her pain and decline in mentation which is a significant change from the previous activity, patient used to be very active in the kitchen. Daughter expresses that she would like her mom to go to a SNF after discharge, they have already began to look into facilities. Daughter is unaware that patient was taking paroxetine, unsure of who is prescribing this. Dysphagia only started yesterday, patient remained NPO until SLP evaluation. Speech recommendations include thin liquid diet.    Donney Dice, DO 03/23/2020, 4:57 PM PGY-1, [place] Medicine Service pager [phone]

## 2020-03-23 NOTE — Progress Notes (Signed)
OT Cancellation Note  Patient Details Name: Ashlee Miles MRN: [id number] DOB: [birth year]   Cancelled Treatment:    Reason Eval/Treat Not Completed: Patient at procedure or test/ unavailable. Pt in hemodialysis.  [place], OTR/L Acute Rehab Services Pager [phone] Office [phone]     Almon Register 03/23/2020, 7:41 AM

## 2020-03-23 NOTE — Evaluation (Signed)
Clinical/Bedside Swallow Evaluation Patient Details  Name: Ashlee Miles MRN: [id number] Date of Birth: 12/12/1940  Today's Date: 03/23/2020 Time: SLP Start Time (ACUTE ONLY): 6433 SLP Stop Time (ACUTE ONLY): 1552 SLP Time Calculation (min) (ACUTE ONLY): 13 min  Past Medical History:  Past Medical History:  Diagnosis Date  . CHF (congestive heart failure) ([place])   . Diabetes mellitus without complication ([place])   . ESRD (end stage renal disease) on dialysis ([place])   . History of pituitary adenoma   . Hypertension   . Protein C deficiency [facility])    Past Surgical History:  Past Surgical History:  Procedure Laterality Date  . AV FISTULA PLACEMENT Left 03/16/2017   Procedure: INSERTION OF ARTERIOVENOUS (AV) GORE-TEX GRAFT THIGH-LEFT;  Surgeon: Angelia Mould, MD;  Location: [place];  Service: Vascular;  Laterality: Left;  . CHOLECYSTECTOMY    . DIALYSIS FISTULA CREATION     clotted off  . FALSE ANEURYSM REPAIR Left 05/10/2017   Procedure: REPAIR FALSE ANEURYSM left thigh AVGG using Gore 78mmx5cm Viabahn Endoprosthesis;  Surgeon: Serafina Mitchell, MD;  Location: [facility] OR;  Service: Vascular;  Laterality: Left;  . I & D EXTREMITY Left 07/14/2017   Procedure: IRRIGATION AND DEBRIDEMENT DISTAL THIGH ABSCESS;  Surgeon: Angelia Mould, MD;  Location: [place];  Service: Vascular;  Laterality: Left;  . INSERTION OF DIALYSIS CATHETER Right 03/16/2017   Procedure: INSERTION OF RIGHT FEMORAL TUNNELED DIALYSIS CATHETER;  Surgeon: Angelia Mould, MD;  Location: [place];  Service: Vascular;  Laterality: Right;  . IR AV DIALY SHUNT INTRO [place] W/PTA/IMG LEFT  11/16/2017  . IR DIALY SHUNT INTRO NEEDLE/INTRACATH INITIAL W/IMG LEFT Left 01/10/2019  . IR THROMBECTOMY AV FISTULA W/THROMBOLYSIS/PTA INC/SHUNT/IMG LEFT Left 06/22/2017  . IR THROMBECTOMY AV FISTULA W/THROMBOLYSIS/PTA/STENT INC/SHUNT/IMG LT Left 03/30/2018  . IR US GUIDE VASC ACCESS LEFT  06/22/2017  . IR US GUIDE VASC  ACCESS LEFT  03/30/2018  . IVC FILTER INSERTION    . PORTACATH PLACEMENT    . THROMBECTOMY AND REVISION OF ARTERIOVENTOUS (AV) GORETEX  GRAFT Left 06/02/2017   Procedure: EVACUATION OF LEFT THIGH HEMATOMA  AND APPLICATION OF WOUND VAC;  Surgeon: Serafina Mitchell, MD;  Location: [facility] OR;  Service: Vascular;  Laterality: Left;   HPI:  Patient is a 79 y/o female who presents with AMS, abdominal pain and fall with a blood sugar of 20. Admitted with hypoglycemia. CXR- pulmonary edema secondary to missed dialysis vs multifocal PNA. PMH includes HTN, protein C deficiency, pituitary adenoma, DVT, ESRD on HD, CHF, DM, IVC filter, thrombectomy. BSE 2019 recommended  regular/thin.    Assessment / Plan / Recommendation Clinical Impression  Pt able to consume thin liquids and puree without indications of reduced airway protection. She allowed the graham cracker to moisten verus efficient mastication due to lack of upper dentition. Respirations were stable. She did belch, grimaced and touched her sternal area denying any burning sensation. Recommend initiate Dys 3 (chopped meats) texture, thin liquids, straws allowed, pill whole in puree. Educated pt to remain upright after meals d/t GERD. No follow up needed.   SLP Visit Diagnosis: Dysphagia, unspecified (R13.10)    Aspiration Risk  Mild aspiration risk    Diet Recommendation Dysphagia 3 (Mech soft);Thin liquid   Liquid Administration via: Cup;Straw Medication Administration: Whole meds with puree Supervision: Patient able to self feed Compensations: Slow rate;Small sips/bites Postural Changes: Seated upright at 90 degrees;Remain upright for at least 30 minutes after po intake    Other  Recommendations Oral Care Recommendations: Oral care BID   Follow up Recommendations None      Frequency and Duration            Prognosis        Swallow Study   General HPI: Patient is a 79 y/o female who presents with AMS, abdominal pain and fall with a  blood sugar of 20. Admitted with hypoglycemia. CXR- pulmonary edema secondary to missed dialysis vs multifocal PNA. PMH includes HTN, protein C deficiency, pituitary adenoma, DVT, ESRD on HD, CHF, DM, IVC filter, thrombectomy. BSE 2019 recommended  regular/thin.  Type of Study: Bedside Swallow Evaluation Previous Swallow Assessment:  (see HPI) Diet Prior to this Study: NPO Temperature Spikes Noted: No History of Recent Intubation: No Behavior/Cognition: Alert;Cooperative;Requires cueing Oral Cavity Assessment: Within Functional Limits Oral Care Completed by SLP: No Oral Cavity - Dentition: Poor condition;Missing dentition Vision: Functional for self-feeding Self-Feeding Abilities: Able to feed self Patient Positioning: Upright in bed Baseline Vocal Quality: Normal Volitional Cough: Weak Volitional Swallow: Able to elicit    Oral/Motor/Sensory Function Overall Oral Motor/Sensory Function: Within functional limits   Ice Chips Ice chips: Not tested   Thin Liquid Thin Liquid: Within functional limits Presentation: Straw    Nectar Thick Nectar Thick Liquid: Not tested   Honey Thick Honey Thick Liquid: Not tested   Puree Puree: Within functional limits   Solid     Solid: Within functional limits      Mick Sell, Orbie Pyo 03/23/2020,4:10 PM  Orbie Pyo [place].Ed Risk analyst [phone] Office [phone]

## 2020-03-23 NOTE — Progress Notes (Signed)
Patient received from dialysis, resting in bed. IV dextrose 10% infusing at 40 mls/hr. In report no CBG's where done. CBG  93. Notified Dr. Larae Grooms of above information, ordered to discontinue IV Dextrose. (order expired) and continue 1 hour CBGs. Goal is to get below 80. Waiting for speech eval before providing food. Will remain NPO at this time. Reviewed with patient  Patients daughter [name] called and was updated on patients plan of care. She stated patient was very confused, just spoke to her on the phone. Patient does not know where she is and needs frequent reminders. Reoriented and then minutes later will not remember information.  Dr. Larae Grooms notified that patient has a history of being diagnosed with pituatary adenoma 5 years ago and last scan was 2-3 years ago. Information obtained from daughter to notify physician.

## 2020-03-24 ENCOUNTER — Inpatient Hospital Stay (HOSPITAL_COMMUNITY): Payer: 59

## 2020-03-24 LAB — RENAL FUNCTION PANEL
Albumin: 2.4 g/dL — ABNORMAL LOW (ref 3.5–5.0)
Anion gap: 13 (ref 5–15)
BUN: 26 mg/dL — ABNORMAL HIGH (ref 8–23)
CO2: 27 mmol/L (ref 22–32)
Calcium: 8.4 mg/dL — ABNORMAL LOW (ref 8.9–10.3)
Chloride: 97 mmol/L — ABNORMAL LOW (ref 98–111)
Creatinine, Ser: 7.14 mg/dL — ABNORMAL HIGH (ref 0.44–1.00)
GFR calc Af Amer: 6 mL/min — ABNORMAL LOW (ref >60)
GFR calc non Af Amer: 5 mL/min — ABNORMAL LOW (ref >60)
Glucose, Bld: 97 mg/dL (ref 70–99)
Phosphorus: 4.4 mg/dL (ref 2.5–4.6)
Potassium: 4.3 mmol/L (ref 3.5–5.1)
Sodium: 137 mmol/L (ref 135–145)

## 2020-03-24 LAB — GLUCOSE, CAPILLARY
Glucose-Capillary: 87 mg/dL (ref 70–99)
Glucose-Capillary: 88 mg/dL (ref 70–99)
Glucose-Capillary: 127 mg/dL — ABNORMAL HIGH (ref 70–99)
Glucose-Capillary: 105 mg/dL — ABNORMAL HIGH (ref 70–99)
Glucose-Capillary: 211 mg/dL — ABNORMAL HIGH (ref 70–99)

## 2020-03-24 LAB — CBC
HCT: 27.5 % — ABNORMAL LOW (ref 36.0–46.0)
Hemoglobin: 8.9 g/dL — ABNORMAL LOW (ref 12.0–15.0)
MCH: 26.6 pg (ref 26.0–34.0)
MCHC: 32.4 g/dL (ref 30.0–36.0)
MCV: 82.1 fL (ref 80.0–100.0)
Platelets: 294 10*3/uL (ref 150–400)
RBC: 3.35 MIL/uL — ABNORMAL LOW (ref 3.87–5.11)
RDW: 15.2 % (ref 11.5–15.5)
WBC: 7.8 10*3/uL (ref 4.0–10.5)
nRBC: 0 % (ref 0.0–0.2)

## 2020-03-24 LAB — PROTIME-INR
INR: 2.3 — ABNORMAL HIGH (ref 0.8–1.2)
Prothrombin Time: 24.4 seconds — ABNORMAL HIGH (ref 11.4–15.2)

## 2020-03-24 MED ORDER — HYDROCODONE-ACETAMINOPHEN 5-325 MG PO TABS
1.0000 | ORAL_TABLET | Freq: Four times a day (QID) | ORAL | Status: DC | PRN
  Administered 2020-03-24 – 2020-03-25 (×2): 1 via ORAL
  Filled 2020-03-24 (×2): qty 1

## 2020-03-24 MED ORDER — SERTRALINE HCL 50 MG PO TABS
50.0000 mg | ORAL_TABLET | Freq: Every day | ORAL | Status: DC
  Administered 2020-03-24 – 2020-03-26 (×3): 50 mg via ORAL
  Filled 2020-03-24 (×3): qty 1

## 2020-03-24 MED ORDER — WARFARIN - PHARMACIST DOSING INPATIENT: Freq: Every day | Status: DC

## 2020-03-24 MED ORDER — DARBEPOETIN ALFA 40 MCG/0.4ML IJ SOSY
40.0000 ug | PREFILLED_SYRINGE | INTRAMUSCULAR | Status: DC
  Administered 2020-03-25: 40 ug via INTRAVENOUS
  Filled 2020-03-24: qty 0.4

## 2020-03-24 MED ORDER — WARFARIN SODIUM 5 MG PO TABS
5.0000 mg | ORAL_TABLET | Freq: Once | ORAL | Status: AC
  Administered 2020-03-24: 5 mg via ORAL
  Filled 2020-03-24: qty 1

## 2020-03-24 NOTE — Progress Notes (Signed)
Family Medicine Teaching Service Daily Progress Note Intern Pager: [phone]  Patient name: Ashlee Miles Medical record number: [id number] Date of birth: Jun 12, 1941 Age: 79 y.o. Gender: female  Primary Care Provider: [name], MD Consultants: Nephrology  Code Status: Full   Pt Overview and Major Events to Date:  Admitted 10/3  Assessment and Plan: Kerrilyn Azbill a 79 y.o.femalepresenting with altered mental status and hypoglycemia. 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  Altered mental status Alert and oriented to person only, stated the year is 2020 and thinks that she is in a trailer. Patient initially presented with altered mental status and hypoglycemia with initial blood sugar of 20.Likely multifactorial, contributing factors including hypoglycemia and in the setting of already having prior gradual decline in memory loss. CT head without acute abnormality. CBGs beginning to gradually normalize, 150>95>100>88>90>190>155>87. CBG 87. - 1 amp D50 prn for hypoglycemia as needed  -CBG monitoring q4h - PT/OT - vitals per routine   ESRD BUN 26 and GFR 5. On MWF dialysis. Last HD 10/4 morning. XR with evidence of possible pulmonary edema. Patient breathing comfortably on room air. -nephrology consulted, continue to appreciate recs -monitor renal panel  Abdominal pain Endorses generalized abdominal tenderness that improves with bowel movements. States that she had two episodes of loose stools yesterday after given Miralax following her speech evaluation. Previous CT abdomen on 9/29 demonstrated constipation.CTA chest/abdomen/pelvis obtained in the ED did not reveal AAA or aortic dissection -continue Miralax -consider repeat imaging if pain worsens and persists    Epigastric pain Denies epigastric pain this morning. She has a history of epigastric pain for over the past week.  PSHxincludes cholecystectomy. She was seen in the ED recently on 9/29 and had a CT of her abdomen which was overall unremarkable except for constipation. She was discharged with hydrocodone-acetaminophen5-325 mgand MiraLAX with plan to follow-up with PCP. CTA chest/abdomen/pelvis obtained in the ED did not reveal AAA or aortic dissection, but did reveal mild patchy opacities which could represent multifocal pneumonia or pulmonary edema. Pain could be a result of pneumonia causing pleuritic pain, so will treat and consider further evaluation if not improving. - IV dilaudid 0.5 mg q4h  Chronic pain syndrome Degenerative disc disease Very little in chart regarding her chronic pain. Does have rx for oxycodone 10mg  QID pRN by a dr Melina Fiddler. Per daughter, patient has a long-standing history of degenerative disc disease that requires ambulation assistance from a walker or cane for the past 4-5 years which has worsened over the past 2 years requiring a wheelchair.  -dilaudid 0.5mg  q4h prn  Pulmonary opacities Mild, diffuse interstitial pulmonary opacities as seen on CXR and CTA which may represent multifocal pneumonia or pulmonary edema in the setting of missed HD. No fever or leukocytosis to suggest pneumonia. Will treat given epigastric/chest pain. - Day 3 of ceftriaxone1g(10/3-) -Day 2 of azithromycin (10/4-)  T2DM Blood glucose 97 wnl and CBG 87 wnl. Home meds: Novolog 70/30 (40 units in the morning, 30 units in the evening). - holding insulin given hypoglycemia -CBG q4h -D50 prn for low blood sugars  HFpEF Most recent TTE 06/2017 with EF 60-65%, G1DD. Home meds: carvedilol 3.125 mg BID. - consider restarting carvedilol   History of DVT/PE Patient does not endorse calf tenderness. With history of protein C deficiency. She has a history of IVC filter and is on warfarin 5 mg daily at home. PT-INR elevated respectively at 24.4 and  2.3. -consider adding back home warfarin    Depression On paroxetine 10 mg at home, although daughter is the one who administers medications and has no recollection of this medication. - consider switching to an different SSRI agent   GERD Home meds: famotidine 20 mg daily, pantoprazole 40 mg BID. - IV protonix 40mg  BID, transition to PO when appropriate  - restart home meds when able to take PO  COPD Home meds: albuterol prn - continue albuterol prn  FEN/GI: dysphagia diet   PPx: subq heparin    Status is: Inpatient  Remains inpatient appropriate because:Altered mental status   Dispo: The patient is from: Home              Anticipated d/c is to: SNF              Anticipated d/c date is: 2 days              Patient currently is not medically stable to d/c.  Subjective:  Patient may have experienced an episode of sundowning last night, became confused and attempted to pull her lines. 1:1 sitter order placed. No sitter present when I was in the room. Vascular team was present around this time, was able to ensure that lines were maintained and properly placed.  When I walk in the room, patient states that she just spoke to her sister and she did not tell her that she was in the hospital, patient thinks she is in a trailer at her sister's house. She was not too pleased to see me, initially refused to let me exam her but later agreed.  . Objective: Temp:  [97.8 F (36.6 C)-98.6 F (37 C)] 98.6 F (37 C) (10/05 0524) Pulse Rate:  [63-87] 80 (10/05 0524) Resp:  [10-21] 16 (10/05 0524) BP: (95-153)/(42-63) 152/56 (10/05 0524) SpO2:  [96 %-100 %] 97 % (10/05 0524) Weight:  [92.8 kg-94.7 kg] 92.8 kg (10/04 1124) Physical Exam: General: Patient laying comfortably in bed, in no acute distress. Cardiovascular: RRR, no murmurs auscultated  Respiratory: lungs clear to auscultation bilaterally, breathing comfortably on room air Abdomen: soft, generalized tenderness on deep palpation without rebound tenderness or guarding,  active bowel sounds present  Extremities: radial and distal pulses intact bilaterally, 2+ pitting LE edema bilaterally  Neuro: AOx1 (self), follows commands and answers questions appropriately   Laboratory: Recent Labs  Lab 03/22/20 1130 03/22/20 1130 03/22/20 1131 03/23/20 0900 03/24/20 0500  WBC 9.4  --   --  9.5 7.8  HGB 10.5*   < > 11.2* 8.7* 8.9*  HCT 33.1*   < > 33.0* 26.5* 27.5*  PLT 283  --   --  269 294   < > = values in this interval not displayed.   Recent Labs  Lab 03/19/20 1006 03/19/20 1006 03/22/20 1130 03/22/20 1130 03/22/20 1131 03/23/20 0900 03/24/20 0500  NA 136   < > 136   < > 136 135 137  K 4.4   < > 5.5*   < > 5.4* 5.5* 4.3  CL 95*   < > 95*  --   --  96* 97*  CO2 27   < > 26  --   --  26 27  BUN 31*   < > 62*  --   --  70* 26*  CREATININE 7.86*   < > 12.35*  --   --  13.80* 7.14*  CALCIUM 9.0   < > 8.3*  --   --  8.1* 8.4*  PROT 6.8  --  6.6  --   --   --   --   BILITOT 0.8  --  0.8  --   --   --   --   ALKPHOS 48  --  49  --   --   --   --   ALT 8  --  8  --   --   --   --   AST 14*  --  16  --   --   --   --   GLUCOSE 99   < > 69*  --   --  82 97   < > = values in this interval not displayed.      Imaging/Diagnostic Tests: No results found.  Donney Dice, DO 03/24/2020, 6:37 AM PGY-1, [place] Intern pager: [phone], text pages welcome

## 2020-03-24 NOTE — Progress Notes (Signed)
Pt confused and continuing to pull at lines and telemetry. Provider on call Dr Kai Levins notified and request made for tele-sitter. Provider placed order for 1:1 safety sitter. Order modified to reflect tele sitter-will trial and escalate as needed. Jessie Foot, RN

## 2020-03-24 NOTE — Evaluation (Signed)
Occupational Therapy Evaluation Patient Details Name: Ashlee Miles MRN: [id number] DOB: 03/16/1941 Today's Date: 03/24/2020    History of Present Illness Patient is a 79 y/o female who presents with AMS, abdominal pain and fall with a blood sugar of 20. Admitted with hypoglycemia. CXR- pulmonary edema secondary to missed dialysis vs multifocal PNA. PMH includes HTN, protein C deficiency, pituitary adenoma, DVT, ESRD on HD, CHF, DM, IVC filter, thrombectomy   Clinical Impression   Pt. Was cooperative with therapy. Pt. Initially refused EOB or OOB secondary to pain. Pt. Later in session wanted to use commode and was a 2 person assist for transfer. Pt.  Will need continued OT and will dc back to snf for rehab. Pt. Is an agreement with continued therapy while she is in the hospital.    Follow Up Recommendations  SNF    Equipment Recommendations  None recommended by OT    Recommendations for Other Services       Precautions / Restrictions Precautions Precautions: Fall Restrictions Weight Bearing Restrictions: No      Mobility Bed Mobility Overal bed mobility: Needs Assistance Bed Mobility: Rolling;Supine to Sit Rolling: Mod assist   Supine to sit: Max assist     General bed mobility comments: Assist to roll to right/left with cues to reach for rail; increased time/effort. Declined EOB or OOB.  Transfers Overall transfer level: Needs assistance Equipment used: 2 person hand held assist Transfers: Sit to/from [facility] Sit to Stand: Mod assist;+2 physical assistance;+2 safety/equipment Stand pivot transfers: Mod assist;+2 physical assistance;+2 safety/equipment            Balance Overall balance assessment: Needs assistance   Sitting balance-[name]: Fair       Standing balance-[name]: Poor                             ADL either performed or assessed with clinical judgement   ADL Overall ADL's : Needs  assistance/impaired Eating/Feeding: Independent   Grooming: Wash/dry hands;Wash/dry face;Set up;Supervision/safety;Sitting   Upper Body Bathing: Moderate assistance;Sitting   Lower Body Bathing: Total assistance;Sit to/from stand   Upper Body Dressing : Moderate assistance;Sitting   Lower Body Dressing: Total assistance;Sit to/from stand   Toilet Transfer: Moderate assistance;+2 for physical assistance;+2 for safety/equipment;Stand-pivot;BSC   Toileting- Clothing Manipulation and Hygiene: Total assistance;+2 for physical assistance;Sit to/from stand       Functional mobility during ADLs: Moderate assistance;+2 for physical assistance;+2 for safety/equipment (Pt. refused EOB or OOB secondary to pain. ) General ADL Comments: Pt. was cooperative wtih therapy and originally refused OOB until she had to use commode.      Vision Baseline Vision/History: Wears glasses Wears Glasses: At all times Patient Visual Report: No change from baseline       Perception     Praxis      Pertinent Vitals/Pain Pain Assessment: 0-10 Pain Score: 8  Pain Location: abdomen Pain Descriptors / Indicators: Aching Pain Intervention(s): Monitored during session;Patient requesting pain meds-RN notified     Hand Dominance Right   Extremity/Trunk Assessment Upper Extremity Assessment Upper Extremity Assessment: Generalized weakness   Lower Extremity Assessment Lower Extremity Assessment: Generalized weakness       Communication Communication Communication: No difficulties   Cognition Arousal/Alertness: Awake/alert Behavior During Therapy: WFL for tasks assessed/performed Overall Cognitive Status: Impaired/Different from baseline Area of Impairment: Orientation;Memory;Awareness;Problem solving;Following commands  Orientation Level: Time   Memory: Decreased short-term memory Following Commands: Follows one step commands consistently     Problem Solving: Slow  processing;Requires verbal cues;Decreased initiation     General Comments       Exercises     Shoulder Instructions      Home Living Family/patient expects to be discharged to:: Skilled nursing facility Living Arrangements: Children Available Help at Discharge: Family Type of Home: House Home Access: Stairs to enter [employer] of Steps: 4 Entrance Stairs-Rails: Right;Left Home Layout: One level     Bathroom Shower/Tub: Teacher, early years/pre: Standard     Home Equipment: Environmental consultant - 4 wheels;Shower seat;Toilet riser   Additional Comments: going back to rehab.       Prior Functioning/Environment Level of Independence: Independent with assistive device(s)        Comments: Uses rollator for ambulation; doing own ADLs. Daughter does IADLs.        OT Problem List: Decreased strength;Decreased activity tolerance;Decreased safety awareness;Decreased knowledge of use of DME or AE      OT Treatment/Interventions: Self-care/ADL training;Therapeutic exercise;DME and/or AE instruction;Therapeutic activities;Patient/family education    OT Goals(Current goals can be found in the care plan section) Acute Rehab OT Goals Patient Stated Goal: to feel better OT Goal Formulation: With patient Time For Goal Achievement: 04/07/20 Potential to Achieve Goals: Good ADL Goals Pt Will Perform Grooming: with modified independence;standing Pt Will Perform Upper Body Bathing: with modified independence;sitting Pt Will Perform Lower Body Bathing: sit to/from stand;with supervision;with set-up Pt Will Perform Upper Body Dressing: with modified independence;sitting Pt Will Perform Lower Body Dressing: with supervision;sit to/from stand Pt Will Transfer to Toilet: with supervision;bedside commode;ambulating Pt Will Perform Toileting - Clothing Manipulation and hygiene: with supervision;sit to/from stand  OT Frequency: Min 2X/week   Barriers to D/C:             Co-evaluation              AM-PAC OT "6 Clicks" Daily Activity     Outcome Measure Help from another person eating meals?: None Help from another person taking care of personal grooming?: A Little Help from another person toileting, which includes using toliet, bedpan, or urinal?: Total Help from another person bathing (including washing, rinsing, drying)?: Total Help from another person to put on and taking off regular upper body clothing?: A Lot Help from another person to put on and taking off regular lower body clothing?: Total 6 Click Score: 12   End of Session Nurse Communication: Patient requests pain meds  Activity Tolerance: Patient limited by pain Patient left: in bed;with call bell/phone within reach;with bed alarm set  OT Visit Diagnosis: Unsteadiness on feet (R26.81);Muscle weakness (generalized) (M62.81)                Time: 1000-1040 OT Time Calculation (min): 40 min Charges:  OT General Charges $OT Visit: 1 Visit OT Evaluation $OT Eval Moderate Complexity: 1 Mod OT Treatments $Self Care/Home Management : 8-22 mins  Reece Packer OT/L   Zaley Talley 03/24/2020, 10:44 AM

## 2020-03-24 NOTE — Progress Notes (Addendum)
ANTICOAGULATION CONSULT NOTE - Initial Consult  Pharmacy Consult for Warfarin Dosing  Indication: History of DVT/PE with Protein C deficiency   No Known Allergies  Patient Measurements: Weight: 92.8 kg (204 lb 9.4 oz)  Vital Signs: Temp: 98.6 F (37 C) (10/05 0524) Temp Source: Oral (10/05 0524) BP: 152/56 (10/05 0524) Pulse Rate: 80 (10/05 0524)  Labs: Recent Labs    03/22/20 1130 03/22/20 1130 03/22/20 1131 03/22/20 1131 03/22/20 1428 03/23/20 0900 03/23/20 1500 03/24/20 0500  HGB 10.5*   < > 11.2*   < >  --  8.7*  --  8.9*  HCT 33.1*   < > 33.0*  --   --  26.5*  --  27.5*  PLT 283  --   --   --   --  269  --  294  APTT 52*  --   --   --   --   --   --   --   LABPROT 26.4*  --   --   --   --   --  28.6* 24.4*  INR 2.5*  --   --   --   --   --  2.8* 2.3*  CREATININE 12.35*  --   --   --   --  13.80*  --  7.14*  TROPONINIHS 115*  --   --   --  111*  --   --   --    < > = values in this interval not displayed.    Estimated Creatinine Clearance: 7 mL/min (A) (by C-G formula based on SCr of 7.14 mg/dL (H)).   Medical History: Past Medical History:  Diagnosis Date  . CHF (congestive heart failure) ([place])   . Diabetes mellitus without complication ([place])   . ESRD (end stage renal disease) on dialysis ([place])   . History of pituitary adenoma   . Hypertension   . Protein C deficiency ([place])     Medications Prior to Admission  Medication Sig Dispense Refill  . albuterol (PROVENTIL) (2.5 MG/3ML) 0.083% nebulizer solution Take 3 mLs (2.5 mg total) by nebulization every 4 (four) hours as needed for wheezing or shortness of breath. Diagnosis COPD J44.9 180 mL 4  . albuterol (VENTOLIN HFA) 108 (90 Base) MCG/ACT inhaler Inhale 2 puffs into the lungs every 4 (four) hours as needed for wheezing or shortness of breath.     . Amino Acids-Protein Hydrolys (FEEDING SUPPLEMENT, PRO-STAT SUGAR FREE 64,) LIQD Take 30 mLs by mouth 2 (two) times daily. 900 mL 0  . carvedilol (COREG) 3.125  MG tablet Take 3.125 mg by mouth 2 (two) times daily with a meal.     . cinacalcet (SENSIPAR) 30 MG tablet Take 30 mg by mouth 3 (three) times a week.     . collagenase (SANTYL) ointment Apply topically 2 (two) times daily. Apply to L thigh wound BID. Wet-to-dry dressing on top Santyl BID 15 g 0  . docusate sodium (COLACE) 100 MG capsule Take 1 capsule (100 mg total) by mouth 2 (two) times daily. 10 capsule 0  . famotidine (PEPCID) 20 MG tablet Take 1 tablet (20 mg total) daily by mouth. 30 tablet 0  . gabapentin (NEURONTIN) 100 MG capsule Take 100 mg by mouth at bedtime.     Marland Kitchen HYDROcodone-acetaminophen (NORCO/VICODIN) 5-325 MG tablet Take 1 tablet by mouth every 6 (six) hours as needed for moderate pain. 20 tablet 0  . insulin aspart protamine- aspart (NOVOLOG MIX 70/30) (70-30) 100 UNIT/ML injection  Inject 30-40 Units into the skin See admin instructions. Inject 40 units subcutaneously in the morning and 30 units in the evening or Sliding Scale    . lidocaine-prilocaine (EMLA) cream Apply 1 application topically as directed.     . midodrine (PROAMATINE) 10 MG tablet Take 1 tablet (10 mg total) by mouth every Monday, Wednesday, and Friday with hemodialysis. 30 tablet 0  . multivitamin (RENA-VIT) TABS tablet Take 1 tablet by mouth at bedtime. 30 tablet 0  . Oxycodone HCl 10 MG TABS Take 10 mg by mouth every 6 (six) hours as needed (pain).     . pantoprazole (PROTONIX) 40 MG tablet Take 1 tablet (40 mg total) 2 (two) times daily before a meal by mouth. 60 tablet 0  . PARoxetine (PAXIL) 10 MG tablet Take 1 tablet (10 mg total) daily by mouth. 30 tablet 0  . polyethylene glycol (MIRALAX / GLYCOLAX) 17 g packet Take 17 g by mouth daily. 14 each 0  . sevelamer carbonate (RENVELA) 800 MG tablet Take 800 mg by mouth 3 (three) times daily with meals.    . simvastatin (ZOCOR) 40 MG tablet Take 40 mg by mouth daily at 6 PM.    . sucralfate (CARAFATE) 1 GM/10ML suspension Take 10 mLs (1 g total) 4 (four) times  daily -  with meals and at bedtime by mouth. 420 mL 0  . thiamine (VITAMIN B-1) 100 MG tablet Take 1 tablet (100 mg total) by mouth daily. 30 tablet 0  . warfarin (COUMADIN) 5 MG tablet Take 5 mg by mouth daily.     Marland Kitchen ZTLIDO 1.8 % PTCH Place 1 patch onto the skin every other day.    . BD PEN NEEDLE NANO U/F 32G X 4 MM MISC 3 (three) times daily.     . calcitRIOL (ROCALTROL) 0.5 MCG capsule Take 0.5 mcg by mouth daily.       Current Facility-Administered Medications:  .  0.9 %  sodium chloride infusion, , Intravenous, PRN, Leeanne Rio, MD, Last Rate: 10 mL/hr at 03/23/20 2157, 250 mL at 03/23/20 2157 .  acetaminophen (TYLENOL) tablet 650 mg, 650 mg, Oral, Q6H PRN **OR** acetaminophen (TYLENOL) suppository 650 mg, 650 mg, Rectal, Q6H PRN, Zola Button, MD .  albuterol (VENTOLIN HFA) 108 (90 Base) MCG/ACT inhaler 2 puff, 2 puff, Inhalation, Q4H PRN, Zola Button, MD .  azithromycin (ZITHROMAX) 500 mg in sodium chloride 0.9 % 250 mL IVPB, 500 mg, Intravenous, Q24H, Brimage, Vondra, DO, Last Rate: 250 mL/hr at 03/23/20 1507, 500 mg at 03/23/20 1507 .  cefTRIAXone (ROCEPHIN) 1 g in sodium chloride 0.9 % 100 mL IVPB, 1 g, Intravenous, Q24H, Zola Button, MD, Stopped at 03/23/20 2300 .  Chlorhexidine Gluconate Cloth 2 % PADS 6 each, 6 each, Topical, q morning - 10a, Leeanne Rio, MD, 6 each at 03/24/20 1046 .  [START ON 03/25/2020] Darbepoetin Alfa (ARANESP) injection 40 mcg, 40 mcg, Intravenous, Q Wed-HD, Ejigiri, Thomos Lemons, PA-C .  dextrose 50 % solution 50 mL, 1 ampule, Intravenous, PRN, Zola Button, MD, 50 mL at 03/22/20 1945 .  heparin lock flush 100 unit/mL, 500 Units, Intracatheter, Once, Zola Button, MD .  HYDROcodone-acetaminophen (NORCO/VICODIN) 5-325 MG per tablet 1 tablet, 1 tablet, Oral, Q6H PRN, Brimage, Vondra, DO .  [START ON 03/25/2020] lidocaine (LIDODERM) 5 % 2 patch, 2 patch, Transdermal, Q48H, Ganta, Anupa, DO .  lidocaine-prilocaine (EMLA) cream 1 application, 1  application, Topical, UD, Sun, Richard, MD .  pantoprazole (PROTONIX) injection 40 mg,  40 mg, Intravenous, Q12H, Benay Pike, MD, 40 mg at 03/24/20 1046 .  polyethylene glycol (MIRALAX / GLYCOLAX) packet 17 g, 17 g, Oral, BID PRN, Brimage, Vondra, DO .  sodium chloride flush (NS) 0.9 % injection 10-40 mL, 10-40 mL, Intracatheter, PRN, Zola Button, MD  Assessment: Patient requires anticoagulation with warfarin as patient has history of DVT/PE with protein c deficiency. Pt is currently in therapeutic range with an INR 2.3. Pt received last warfarin dose  . 5 mg dose selected despite potential dose increase with azithromycin 500 mg IV daily (day 2 of 3) as patient has not received warfarin since being admitted inpatient (last dose of warfarin 10/2).  Goal of Therapy:  Achieve target INR 2-3.      Plan:  Initiate 5 mg of warfarin daily by mouth for one dose.  Titrate dose accordingly based on daily INR results.  Monitor INR daily.  Monitor for signs of bleeding daily.  Laniesha Das, PharmD-Candidate  03/24/2020,1:13 PM

## 2020-03-24 NOTE — NC FL2 (Signed)
[place] LEVEL OF CARE SCREENING TOOL     IDENTIFICATION  Patient Name: Ashlee Miles Birthdate: [birth year] Sex: female Admission Date (Current Location): 03/22/2020  [facility] and [place] Number:  Herbalist and Address:  [facility] [place], [place] [address], [place], [place] [address]      Provider Number: [id number]  Attending Physician Name and Address:  Leeanne Rio, MD  Relative Name and Phone Number:  [name] [phone]    Current Level of Care: Hospital Recommended Level of Care: [place] Prior Approval Number:    Date Approved/Denied:   PASRR Number: [id number]  Discharge Plan: SNF    Current Diagnoses: Patient Active Problem List   Diagnosis Date Noted  . AMS (altered mental status) 03/22/2020  . Hypoglycemia 03/22/2020  . Dyspnea 02/12/2019  . Memory loss 03/25/2018  . Generalized weakness 10/31/2017  . Palliative care encounter 10/31/2017  . Leg wound, left 07/12/2017  . Cellulitis of left lower extremity 07/12/2017  . HLD (hyperlipidemia) 07/12/2017  . Chest pain in adult 06/22/2017  . Elevated troponin   . Altered mental status   . Diabetes mellitus with end stage renal disease ([place]) 05/31/2017  . Depression 05/31/2017  . GERD (gastroesophageal reflux disease) 05/31/2017  . Left groin pain 05/31/2017  . Protein C deficiency ([place])   . 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 Falls 04/25/2017  . Hypotension 04/25/2017  . Epigastric pain 04/25/2017  . Constipation 04/25/2017  . ESRD on dialysis ([place]) 04/06/2017  . Anemia due to chronic kidney disease 04/06/2017  . Thrombocytopenia ([place]) 04/06/2017  . Cough in adult 04/06/2017  . DVT (deep venous thrombosis) ([place]) 04/05/2017  . Pressure injury of skin 03/14/2017  . Acute metabolic encephalopathy 53/29/9242  . Skin lesion of hand 03/12/2017  . TIA (transient ischemic attack) 03/01/2017  . Syncope 03/01/2017  . Diabetes mellitus with  complication ([place]) 68/34/1962  . Essential hypertension 03/01/2017  . Chronic diastolic CHF (congestive heart failure) ([place]) 03/01/2017    Orientation RESPIRATION BLADDER Height & Weight     Self  Normal Incontinent, External catheter (External Urinary Catheter) Weight: 204 lb 9.4 oz (92.8 kg) Height:     BEHAVIORAL SYMPTOMS/MOOD NEUROLOGICAL BOWEL NUTRITION STATUS      Continent Diet (See Discharge Summary)  AMBULATORY STATUS COMMUNICATION OF NEEDS Skin   Extensive Assist Verbally Other (Comment) (PI Sacrum medial stage 1 and PI Heel R;L Stage 1 intact skin with nonblanchable redness of a localized area usually over a bony prominence)                       Personal Care Assistance Level of Assistance  Bathing, Feeding, Dressing Bathing Assistance: Maximum assistance Feeding assistance: Limited assistance (Needs assist) Dressing Assistance: Maximum assistance     Functional Limitations Info  Sight, Hearing, Speech Sight Info: Adequate Hearing Info: Adequate Speech Info: Adequate    SPECIAL CARE FACTORS FREQUENCY  PT (By licensed PT), OT (By licensed OT)     PT Frequency: 5x min weekly OT Frequency: 5x min weekly            Contractures      Additional Factors Info  Code Status Code Status Info: FULL code             Current Medications (03/24/2020):  This is the current hospital active medication list Current Facility-Administered Medications  Medication Dose Route Frequency Provider Last Rate Last Admin  . 0.9 %  sodium chloride  infusion   Intravenous PRN Leeanne Rio, MD 10 mL/hr at 03/23/20 2157 250 mL at 03/23/20 2157  . acetaminophen (TYLENOL) tablet 650 mg  650 mg Oral Q6H PRN Zola Button, MD       Or  . acetaminophen (TYLENOL) suppository 650 mg  650 mg Rectal Q6H PRN Zola Button, MD      . albuterol (VENTOLIN HFA) 108 (90 Base) MCG/ACT inhaler 2 puff  2 puff Inhalation Q4H PRN Zola Button, MD      . azithromycin [facility]) 500 mg in  sodium chloride 0.9 % 250 mL IVPB  500 mg Intravenous Q24H Brimage, Vondra, DO 250 mL/hr at 03/23/20 1507 500 mg at 03/23/20 1507  . cefTRIAXone (ROCEPHIN) 1 g in sodium chloride 0.9 % 100 mL IVPB  1 g Intravenous Q24H Zola Button, MD   Stopped at 03/23/20 2300  . Chlorhexidine Gluconate Cloth 2 % PADS 6 each  6 each Topical q morning - 10a Leeanne Rio, MD   6 each at 03/24/20 1046  . [START ON 03/25/2020] Darbepoetin Alfa (ARANESP) injection 40 mcg  40 mcg Intravenous Q Wed-HD Lynnda Child, PA-C      . dextrose 50 % solution 50 mL  1 ampule Intravenous PRN Zola Button, MD   50 mL at 03/22/20 1945  . heparin lock flush 100 unit/mL  500 Units Intracatheter Once Zola Button, MD      . HYDROcodone-acetaminophen (NORCO/VICODIN) 5-325 MG per tablet 1 tablet  1 tablet Oral Q6H PRN Brimage, Vondra, DO      . [START ON 03/25/2020] lidocaine (LIDODERM) 5 % 2 patch  2 patch Transdermal Q48H Ganta, Anupa, DO      . lidocaine-prilocaine (EMLA) cream 1 application  1 application Topical UD Zola Button, MD      . pantoprazole (PROTONIX) injection 40 mg  40 mg Intravenous Q12H Benay Pike, MD   40 mg at 03/24/20 1046  . polyethylene glycol (MIRALAX / GLYCOLAX) packet 17 g  17 g Oral BID PRN Brimage, Vondra, DO      . sodium chloride flush (NS) 0.9 % injection 10-40 mL  10-40 mL Intracatheter PRN Zola Button, MD         Discharge Medications: Please see discharge summary for a list of discharge medications.  Relevant Imaging Results:  Relevant Lab Results:   Additional Information [phone]  Trula Ore, LCSWA

## 2020-03-24 NOTE — Progress Notes (Signed)
[facility] Progress Note   Subjective:  Seen in room. Had no problems with dialysis yesterday.  No improvement in chest/epigastric pain today. Worse with movement, deep breaths.   Objective Vitals:   03/23/20 1618 03/23/20 2100 03/23/20 2329 03/24/20 0524  BP:  (!) 153/60 (!) 149/60 (!) 152/56  Pulse:  81 75 80  Resp:  16 18 16   Temp: 97.8 F (36.6 C) 98.4 F (36.9 C) 98.6 F (37 C) 98.6 F (37 C)  TempSrc: Oral Oral Oral Oral  SpO2:  97% 97% 97%  Weight:         Additional Objective Labs: Basic Metabolic Panel: Recent Labs  Lab 03/22/20 1130 03/22/20 1130 03/22/20 1131 03/23/20 0900 03/24/20 0500  NA 136   < > 136 135 137  K 5.5*   < > 5.4* 5.5* 4.3  CL 95*  --   --  96* 97*  CO2 26  --   --  26 27  GLUCOSE 69*  --   --  82 97  BUN 62*  --   --  70* 26*  CREATININE 12.35*  --   --  13.80* 7.14*  CALCIUM 8.3*  --   --  8.1* 8.4*  PHOS  --   --   --  7.0* 4.4   < > = values in this interval not displayed.   CBC: Recent Labs  Lab 03/19/20 1055 03/19/20 1055 03/22/20 1130 03/22/20 1130 03/22/20 1131 03/23/20 0900 03/24/20 0500  WBC 7.9   < > 9.4  --   --  9.5 7.8  NEUTROABS 5.3  --   --   --   --   --   --   HGB 9.8*   < > 10.5*   < > 11.2* 8.7* 8.9*  HCT 31.0*   < > 33.1*   < > 33.0* 26.5* 27.5*  MCV 82.9  --  80.9  --   --  79.6* 82.1  PLT 188   < > 283  --   --  269 294   < > = values in this interval not displayed.   Blood Culture    Component Value Date/Time   SDES URINE, RANDOM 03/19/2020 1021   SPECREQUEST  03/19/2020 1021    NONE Performed at [facility], [place] [address], [place], [place] [address]    CULT MULTIPLE SPECIES PRESENT, SUGGEST RECOLLECTION (A) 03/19/2020 1021   REPTSTATUS 03/20/2020 FINAL 03/19/2020 1021     Physical Exam General: WNWD elderly woman, nad  Heart: RRR Lungs: Clear, bilaterally  Abdomen: soft non-tender  Extremities: No sig LE edema  Dialysis Access: L thigh AVG in use on dialysis    Medications: . sodium chloride 250 mL (03/23/20 2157)  . azithromycin 500 mg (03/23/20 1507)  . cefTRIAXone (ROCEPHIN)  IV Stopped (03/23/20 2300)   . Chlorhexidine Gluconate Cloth  6 each Topical q morning - 10a  . heparin lock flush  500 Units Intracatheter Once  . [START ON 03/25/2020] lidocaine  2 patch Transdermal Q48H  . lidocaine-prilocaine  1 application Topical UD  . pantoprazole (PROTONIX) IV  40 mg Intravenous Q12H    Dialysis Orders:  MWF at [facility] --> last HD 9/29, only ran 1:46hr of 4hr, but left below EDW 4hr, 400/800, EDW 92.3kg, 2K/2.5Ca, AVG, heparin 3000 unit bolus - Hectoral 46mcg IV q HD - Mircera 61mcg IV q 4 weeks (last 9/15) - Home meds: Sensipar 30mg  on MWF, Renvela 2/meals.  Assessment/Plan: 1. Hypoglycemia: Unclear etiology -- low intake v. insulin misuse. Improving.  Per hospitalist. 2. AMS: Felt d/t #1 -- Improved.  3. Epigastric v. chest pain: No acute abdominal issues on 9/30 CT -- CTA chest 10/3- no aneurysm/disscetion but concerning for multifocal pneumonia  Trop - high, trending. EKG without ST changes. No improvement following dialysis.  4. ESRD: HD MWF. missed her last HD. Next HD 10/6.  5. Hypertension/volume: BP at goal.  See above, UF as tolerated with next HD - EDW may need to be lowered. 6.  Anemia: Hgb. 8.7. Last ESA 9/15. Will order Aranesp 40 with HD 37/9.  7.  Metabolic bone disease: Ca ok. Restart home binder (Renvela) when eating. 8. T2DM: See #1. On low dose Lantus at home - holding here. Per primary. 9. 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  Lynnda Child PA-C [facility] 03/24/2020,11:29 AM  LOS: 2 days

## 2020-03-24 NOTE — TOC Progression Note (Addendum)
Transition of Care [facility]) - Progression Note    Patient Details  Name: Ashlee Miles MRN: [id number] Date of Birth: Apr 02, 1941  Transition of Care [facility]) CM/SW [place], [place] Phone Number: 03/24/2020, 2:47 PM  Clinical Narrative:     Update 4:05pm-CSW spoke with patients daughter at bedside. Patient and patients daughter confirmed they would like [facility] for SNF placement. CSW called [facility] and spoke with Juliann Pulse who confirmed they can accept patient for SNF placement. CSW will coordinate with Renal Navigator to help set up dialysis for patient with facility.CSW updated insurance with facility choice.  CSW will continue to follow.  Insurance authorization has been started for patient.Insurance authorization start date is for 10/6. Insurance authorization reference number is #[id number]  CSW tried to call and give SNF bed offers to patients daughter Ashlee Miles. CSW left voicemail. CSW awaiting call back.  CSW will continue to follow.  Expected Discharge Plan: [place] Barriers to Discharge: Continued Medical Work up  Expected Discharge Plan and Services Expected Discharge Plan: [place] arrangements for the past 2 months: Single Family Home                                       Social Determinants of Health (SDOH) Interventions    Readmission Risk Interventions No flowsheet data found.

## 2020-03-25 LAB — RENAL FUNCTION PANEL
Albumin: 2.4 g/dL — ABNORMAL LOW (ref 3.5–5.0)
Anion gap: 13 (ref 5–15)
BUN: 35 mg/dL — ABNORMAL HIGH (ref 8–23)
CO2: 27 mmol/L (ref 22–32)
Calcium: 8.3 mg/dL — ABNORMAL LOW (ref 8.9–10.3)
Chloride: 97 mmol/L — ABNORMAL LOW (ref 98–111)
Creatinine, Ser: 8.57 mg/dL — ABNORMAL HIGH (ref 0.44–1.00)
GFR calc non Af Amer: 4 mL/min — ABNORMAL LOW (ref >60)
Glucose, Bld: 173 mg/dL — ABNORMAL HIGH (ref 70–99)
Phosphorus: 4.8 mg/dL — ABNORMAL HIGH (ref 2.5–4.6)
Potassium: 4.7 mmol/L (ref 3.5–5.1)
Sodium: 137 mmol/L (ref 135–145)

## 2020-03-25 LAB — CBC
HCT: 26.8 % — ABNORMAL LOW (ref 36.0–46.0)
Hemoglobin: 8.7 g/dL — ABNORMAL LOW (ref 12.0–15.0)
MCH: 26.2 pg (ref 26.0–34.0)
MCHC: 32.5 g/dL (ref 30.0–36.0)
MCV: 80.7 fL (ref 80.0–100.0)
Platelets: 294 10*3/uL (ref 150–400)
RBC: 3.32 MIL/uL — ABNORMAL LOW (ref 3.87–5.11)
RDW: 14.8 % (ref 11.5–15.5)
WBC: 7.5 10*3/uL (ref 4.0–10.5)
nRBC: 0 % (ref 0.0–0.2)

## 2020-03-25 LAB — GLUCOSE, CAPILLARY
Glucose-Capillary: 164 mg/dL — ABNORMAL HIGH (ref 70–99)
Glucose-Capillary: 95 mg/dL (ref 70–99)
Glucose-Capillary: 104 mg/dL — ABNORMAL HIGH (ref 70–99)
Glucose-Capillary: 128 mg/dL — ABNORMAL HIGH (ref 70–99)

## 2020-03-25 LAB — SARS CORONAVIRUS 2 BY RT PCR (HOSPITAL ORDER, PERFORMED IN ~~LOC~~ HOSPITAL LAB): SARS Coronavirus 2: NEGATIVE

## 2020-03-25 LAB — PROTIME-INR
INR: 2.2 — ABNORMAL HIGH (ref 0.8–1.2)
Prothrombin Time: 23.9 seconds — ABNORMAL HIGH (ref 11.4–15.2)

## 2020-03-25 MED ORDER — ALTEPLASE 2 MG IJ SOLR: 2.0000 mg | Freq: Once | INTRAMUSCULAR | Status: DC | PRN

## 2020-03-25 MED ORDER — DARBEPOETIN ALFA 40 MCG/0.4ML IJ SOSY
PREFILLED_SYRINGE | INTRAMUSCULAR | Status: AC
  Filled 2020-03-25: qty 0.4

## 2020-03-25 MED ORDER — PANTOPRAZOLE SODIUM 40 MG PO TBEC: 40.0000 mg | DELAYED_RELEASE_TABLET | Freq: Two times a day (BID) | ORAL | Status: DC

## 2020-03-25 MED ORDER — PANTOPRAZOLE SODIUM 40 MG PO TBEC
40.0000 mg | DELAYED_RELEASE_TABLET | Freq: Two times a day (BID) | ORAL | Status: DC
  Administered 2020-03-25 – 2020-03-26 (×2): 40 mg via ORAL
  Filled 2020-03-25 (×3): qty 1

## 2020-03-25 MED ORDER — THIAMINE HCL 100 MG PO TABS
100.0000 mg | ORAL_TABLET | Freq: Every day | ORAL | Status: DC
  Administered 2020-03-25 – 2020-03-26 (×2): 100 mg via ORAL
  Filled 2020-03-25 (×2): qty 1

## 2020-03-25 MED ORDER — PENTAFLUOROPROP-TETRAFLUOROETH EX AERO: 1.0000 "application " | INHALATION_SPRAY | CUTANEOUS | Status: DC | PRN

## 2020-03-25 MED ORDER — LIDOCAINE-PRILOCAINE 2.5-2.5 % EX CREA: 1.0000 "application " | TOPICAL_CREAM | CUTANEOUS | Status: DC | PRN

## 2020-03-25 MED ORDER — LIDOCAINE HCL (PF) 1 % IJ SOLN: 5.0000 mL | INTRAMUSCULAR | Status: DC | PRN

## 2020-03-25 MED ORDER — HEPARIN SODIUM (PORCINE) 1000 UNIT/ML DIALYSIS: 1000.0000 [IU] | INTRAMUSCULAR | Status: DC | PRN

## 2020-03-25 MED ORDER — WARFARIN SODIUM 5 MG PO TABS
5.0000 mg | ORAL_TABLET | Freq: Once | ORAL | Status: AC
  Administered 2020-03-25: 5 mg via ORAL
  Filled 2020-03-25: qty 1

## 2020-03-25 MED ORDER — SEVELAMER CARBONATE 800 MG PO TABS
800.0000 mg | ORAL_TABLET | Freq: Three times a day (TID) | ORAL | Status: DC
  Administered 2020-03-25 – 2020-03-26 (×4): 800 mg via ORAL
  Filled 2020-03-25 (×4): qty 1

## 2020-03-25 MED ORDER — SIMVASTATIN 20 MG PO TABS
40.0000 mg | ORAL_TABLET | Freq: Every day | ORAL | Status: DC
  Administered 2020-03-25: 40 mg via ORAL
  Filled 2020-03-25: qty 2

## 2020-03-25 MED ORDER — SODIUM CHLORIDE 0.9 % IV SOLN: 100.0000 mL | INTRAVENOUS | Status: DC | PRN

## 2020-03-25 MED ORDER — CARVEDILOL 3.125 MG PO TABS: 3.1250 mg | ORAL_TABLET | Freq: Two times a day (BID) | ORAL | Status: DC

## 2020-03-25 MED ORDER — CEFDINIR 300 MG PO CAPS
300.0000 mg | ORAL_CAPSULE | Freq: Once | ORAL | Status: AC
  Administered 2020-03-25: 300 mg via ORAL
  Filled 2020-03-25: qty 1

## 2020-03-25 MED ORDER — HEPARIN SODIUM (PORCINE) 1000 UNIT/ML DIALYSIS: 20.0000 [IU]/kg | INTRAMUSCULAR | Status: DC | PRN

## 2020-03-25 MED ORDER — CARVEDILOL 3.125 MG PO TABS
3.1250 mg | ORAL_TABLET | Freq: Two times a day (BID) | ORAL | Status: DC
  Administered 2020-03-25 – 2020-03-26 (×2): 3.125 mg via ORAL
  Filled 2020-03-25 (×2): qty 1

## 2020-03-25 MED ORDER — AMLODIPINE BESYLATE 5 MG PO TABS
5.0000 mg | ORAL_TABLET | Freq: Every day | ORAL | Status: DC
  Administered 2020-03-25: 5 mg via ORAL
  Filled 2020-03-25: qty 1

## 2020-03-25 NOTE — TOC Progression Note (Signed)
Transition of Care [facility]) - Progression Note    Patient Details  Name: Ashlee Miles MRN: [id number] Date of Birth: February 18, 1941  Transition of Care [facility]) CM/SW [place], [place] Phone Number: 03/25/2020, 2:30 PM  Clinical Narrative:      CSW spoke with patients insurance and insurance authorization has been approved. Approval date is for 10/6-10/8. Next review date will be on 10/8. Approval number is # [id number]. Navi ID is #[id number].  Patient has SNF bed at [facility]. Insurance authorization has been approved.  CSW will continue to follow. Expected Discharge Plan: [place] Barriers to Discharge: Continued Medical Work up  Expected Discharge Plan and Services Expected Discharge Plan: [place] arrangements for the past 2 months: Single Family Home                                       Social Determinants of Health (SDOH) Interventions    Readmission Risk Interventions Readmission Risk Prevention Plan 03/25/2020  Transportation Screening Complete  Palliative Care Screening Not Applicable  Skilled Nursing Facility Complete  Some recent data might be hidden

## 2020-03-25 NOTE — Progress Notes (Addendum)
Family Medicine Teaching Service Daily Progress Note Intern Pager: [phone]  Patient name: Ashlee Miles Medical record number: [id number] Date of birth: Aug 07, 1940 Age: 79 y.o. Gender: female  Primary Care Provider: [name], MD Consultants: Nephrology  Code Status: Full  Pt Overview and Major Events to Date:  Admitted 10/3  Assessment and Plan: Ashlee Miles a 79 y.o.femalepresenting with altered mental status and hypoglycemia. 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  Altered mental status Alert and oriented to person and relative place, states that she is in [place]. Patientinitiallypresented with altered mental status and hypoglycemia with initial blood sugar of 20.Likely multifactorial, contributing factors including hypoglycemia and in the setting of already having prior gradual decline in memory loss.CT head without acute abnormality.MRI demonstrated no acute findings with mild chronic microvascular changes. CBGs 127>105>211>164.  - 1 amp D50 prn for hypoglycemia as needed  -CBG monitoring q4h, consider sliding scale if persistently elevated - PT/OT - vitals per routine   ESRD BUN 35 and GFR 4. On MWF dialysis. In HD this morning. XR with evidence of possible pulmonary edema. Patient breathing comfortably on room air.  -nephrology consulted, continue to appreciate recs -monitor renal panel  HTN Normotensive this morning at 113/72. Patient has had episodes of hypertension overnight. With recent highest BP recorded at 177/69 yesterday evening.  -restarted home med carvedilol 3.125 mg bid  Abdominal pain Endorses generalized abdominal tenderness that improves with bowel movements.  Previous CT abdomen on 9/29 demonstrated constipation.CTA chest/abdomen/pelvis obtained in the ED did not reveal AAA or aortic dissection -continue Miralax -consider repeat  imaging if pain worsens and persists     Chronic pain syndrome Degenerative disc disease Very little in chart regarding her chronic pain. Does have rx for oxycodone 10mg  QID pRN by a dr Melina Fiddler. Per daughter, patient has a long-standing history of degenerative disc disease that requires ambulation assistance from a walker or cane for the past 4-5 years which has worsened over the past 2 years requiring a wheelchair.  -hydrocodone-acetaminophen  Pulmonary opacities Mild, diffuse interstitial pulmonary opacities as seen on CXR and CTA which may represent multifocal pneumonia or pulmonary edema in the setting of missed HD. No fever or leukocytosis to suggest pneumonia. Will treat given epigastric/chest pain. -d/cceftriaxone1g(10/3-10/6), transitioned to PO cefdinir 300 mg x1 -Day 3 of azithromycin (10/4-10/6) scheduled to end today  T2DM Blood glucose 173 and CBG 164. Home meds: Novolog 70/30 (40 units in the morning, 30 units in the evening). Potential increase given that patient is gradually returning back to her regular diet.  -monitor CBG, consider sliding scale if persistently elevated - holding insulin given hypoglycemia -CBG q4h -D50 prn for low blood sugars  HFpEF Most recent TTE 06/2017 with EF 60-65%, G1DD. Home meds: carvedilol 3.125 mg BID. - restarted carvedilol 3.125 mg bid  History of DVT/PE Patient does not endorse calf tenderness. With history of protein C deficiency. She has a history of IVC filter and is on warfarin 5 mg daily at home. PT-INR elevated respectively at 23.9 and 2.2. -warfarin 5 mg   Depression On paroxetine 10 mg at home, although daughter is the one who administers medications and has no recollection of this medication. - d/c paroxetine -started on sertraline 50 mg qd  GERD Endorses epigastric pain this morning.She has a history of epigastric pain for over the past week. She has started eating, ate some fish and rice at dinner  yesterday.  PSHxincludes cholecystectomy. She was seen in the ED recently on 9/29 and had a CT of her abdomen which was overall unremarkable except for constipation. She was discharged with hydrocodone-acetaminophen5-325 mgand MiraLAX with plan to follow-up with PCP. CTA chest/abdomen/pelvis obtained in the ED did not reveal AAA or aortic dissection, but did reveal mild patchy opacities which could represent multifocal pneumonia or pulmonary edema. Home meds: famotidine 20 mg daily, pantoprazole 40 mg BID. -transitioned to pantoprazole 40 mg bid   COPD Denies dyspnea. Home meds: albuterol prn - continue albuterol prn  SNF placement Patient was living with daughter [name]) and daughter's husband. Previously spoke with daughter over the phone, she states that her and her husband have started the process of looking into facilities. Daughter desires for patient to be discharged to a SNF following this hospitalization. Patient is agreeable to SNF as well.  -social work consult placed for assistance  FEN/GI: dysphagia diet  PPx: heparin   Status is: Inpatient  Remains inpatient appropriate because:Altered mental status   Dispo: The patient is from: Home              Anticipated d/c is to: SNF              Anticipated d/c date is: 1 day              Patient currently is not medically stable to d/c.        Subjective:  Patient seen and examined in HD, states that she did not enjoy her MRI. Discussed the results with her and the importance of getting this imaging done, she seems to demonstrate understanding of this. States that she has been eating well recently, ate fish and rice last night. Endorses occasional lingering cough and congestion. Denies any new concerns at this time.  Objective: Temp:  [97.6 F (36.4 C)-98.7 F (37.1 C)] 97.6 F (36.4 C) (10/06 0537) Pulse Rate:  [82] 82 (10/05 2055) Resp:  [16-18] 18 (10/06 0537) BP: (134-173)/(68-90) 168/82 (10/06 0537) SpO2:  [96  %-99 %] 99 % (10/06 0537) Weight:  [92.9 kg] 92.9 kg (10/06 0533) Physical Exam: General: Patient sitting upright comfortably, in no acute distress. Cardiovascular: RRR, no murmurs Respiratory: lungs clear to auscultation bilaterally, no focal findings, breathing comfortably on room air Abdomen: soft, generalized tenderness on deep palpation without rebound tenderness or guarding, presence of active bowel sounds Extremities: 1+ pitting LE edema bilaterally, no pedal edema noted, radial and distal pulses intact bilaterally, no gross deformities  Neuro: AOx1, appropriately conversational, seemed to demonstrate a more clear understanding of her hospitalization today   Laboratory: Recent Labs  Lab 03/23/20 0900 03/24/20 0500 03/25/20 0555  WBC 9.5 7.8 7.5  HGB 8.7* 8.9* 8.7*  HCT 26.5* 27.5* 26.8*  PLT 269 294 294   Recent Labs  Lab 03/19/20 1006 03/19/20 1006 03/22/20 1130 03/22/20 1131 03/23/20 0900 03/24/20 0500 03/25/20 0555  NA 136   < > 136   < > 135 137 137  K 4.4   < > 5.5*   < > 5.5* 4.3 4.7  CL 95*   < > 95*   < > 96* 97* 97*  CO2 27   < > 26   < > 26 27 27   BUN 31*   < > 62*   < > 70* 26* 35*  CREATININE 7.86*   < > 12.35*   < > 13.80* 7.14* 8.57*  CALCIUM 9.0   < > 8.3*   < > 8.1*  8.4* 8.3*  PROT 6.8  --  6.6  --   --   --   --   BILITOT 0.8  --  0.8  --   --   --   --   ALKPHOS 48  --  49  --   --   --   --   ALT 8  --  8  --   --   --   --   AST 14*  --  16  --   --   --   --   GLUCOSE 99   < > 69*   < > 82 97 173*   < > = values in this interval not displayed.     Imaging/Diagnostic Tests: MR BRAIN WO CONTRAST  Result Date: 03/24/2020 CLINICAL DATA:  Mental status change, persistent or worsening EXAM: MRI HEAD WITHOUT CONTRAST TECHNIQUE: Multiplanar, multiecho pulse sequences of the brain and surrounding structures were obtained without intravenous contrast. COMPARISON:  None. FINDINGS: DWI, axial T2, axial T2 FLAIR sequences were obtained. Patient could  not tolerate remainder of the study. Obtained sequences are motion degraded. Brain: No acute infarction. There is no intracranial mass, mass effect, edema, hydrocephalus, or extra-axial fluid collection. Patchy T2 hyperintensity in the supratentorial white matter is nonspecific but may reflect mild chronic microvascular ischemic changes. Vascular: Major vessel flow voids at the skull base are preserved. Skull and upper cervical spine: No aggressive osseous lesion identified. Sinuses/Orbits: Trace mucosal thickening. Bilateral lens replacements. Other: Patchy left mastoid fluid opacification. IMPRESSION: Motion degraded study demonstrates no acute infarction or other acute abnormality. Mild chronic microvascular ischemic changes. Electronically Signed   By: Macy Mis M.D.   On: 03/24/2020 14:58    Donney Dice, DO 03/25/2020, 6:47 AM PGY-1, [place] Intern pager: [phone], text pages welcome

## 2020-03-25 NOTE — Plan of Care (Signed)
  Problem: Clinical Measurements: Goal: Respiratory complications will improve Outcome: Progressing Goal: Cardiovascular complication will be avoided Outcome: Progressing   

## 2020-03-25 NOTE — Progress Notes (Addendum)
ANTICOAGULATION CONSULT NOTE - Follow-up Consult  Pharmacy Consult for Warfarin Dosing  Indication: History of DVT/PE with Protein C deficiency   No Known Allergies  Patient Measurements: Weight: 91 kg (200 lb 9.9 oz)  Vital Signs: Temp: 97.5 F (36.4 C) (10/06 1253) Temp Source: Oral (10/06 1253) BP: 175/64 (10/06 1253) Pulse Rate: 75 (10/06 1253)  Labs: Recent Labs    03/22/20 1428 03/23/20 0900 03/23/20 0900 03/23/20 1500 03/24/20 0500 03/25/20 0555  HGB  --  8.7*   < >  --  8.9* 8.7*  HCT  --  26.5*  --   --  27.5* 26.8*  PLT  --  269  --   --  294 294  LABPROT  --   --   --  28.6* 24.4* 23.9*  INR  --   --   --  2.8* 2.3* 2.2*  CREATININE  --  13.80*  --   --  7.14* 8.57*  TROPONINIHS 111*  --   --   --   --   --    < > = values in this interval not displayed.    Estimated Creatinine Clearance: 5.8 mL/min (A) (by C-G formula based on SCr of 8.57 mg/dL (H)).   Medical History: Past Medical History:  Diagnosis Date   CHF (congestive heart failure) ([place])    Diabetes mellitus without complication ([place])    ESRD (end stage renal disease) on dialysis ([place])    History of pituitary adenoma    Hypertension    Protein C deficiency ([place])     Medications Prior to Admission  Medication Sig Dispense Refill   albuterol (PROVENTIL) (2.5 MG/3ML) 0.083% nebulizer solution Take 3 mLs (2.5 mg total) by nebulization every 4 (four) hours as needed for wheezing or shortness of breath. Diagnosis COPD J44.9 180 mL 4   albuterol (VENTOLIN HFA) 108 (90 Base) MCG/ACT inhaler Inhale 2 puffs into the lungs every 4 (four) hours as needed for wheezing or shortness of breath.      Amino Acids-Protein Hydrolys (FEEDING SUPPLEMENT, PRO-STAT SUGAR FREE 64,) LIQD Take 30 mLs by mouth 2 (two) times daily. 900 mL 0   carvedilol (COREG) 3.125 MG tablet Take 3.125 mg by mouth 2 (two) times daily with a meal.      cinacalcet (SENSIPAR) 30 MG tablet Take 30 mg by mouth 3 (three) times a week.       collagenase (SANTYL) ointment Apply topically 2 (two) times daily. Apply to L thigh wound BID. Wet-to-dry dressing on top Santyl BID 15 g 0   docusate sodium (COLACE) 100 MG capsule Take 1 capsule (100 mg total) by mouth 2 (two) times daily. 10 capsule 0   famotidine (PEPCID) 20 MG tablet Take 1 tablet (20 mg total) daily by mouth. 30 tablet 0   gabapentin (NEURONTIN) 100 MG capsule Take 100 mg by mouth at bedtime.      HYDROcodone-acetaminophen (NORCO/VICODIN) 5-325 MG tablet Take 1 tablet by mouth every 6 (six) hours as needed for moderate pain. 20 tablet 0   insulin aspart protamine- aspart (NOVOLOG MIX 70/30) (70-30) 100 UNIT/ML injection Inject 30-40 Units into the skin See admin instructions. Inject 40 units subcutaneously in the morning and 30 units in the evening or Sliding Scale     lidocaine-prilocaine (EMLA) cream Apply 1 application topically as directed.      midodrine (PROAMATINE) 10 MG tablet Take 1 tablet (10 mg total) by mouth every Monday, Wednesday, and Friday with hemodialysis. 30 tablet 0  multivitamin (RENA-VIT) TABS tablet Take 1 tablet by mouth at bedtime. 30 tablet 0   Oxycodone HCl 10 MG TABS Take 10 mg by mouth every 6 (six) hours as needed (pain).      pantoprazole (PROTONIX) 40 MG tablet Take 1 tablet (40 mg total) 2 (two) times daily before a meal by mouth. 60 tablet 0   PARoxetine (PAXIL) 10 MG tablet Take 1 tablet (10 mg total) daily by mouth. 30 tablet 0   polyethylene glycol (MIRALAX / GLYCOLAX) 17 g packet Take 17 g by mouth daily. 14 each 0   sevelamer carbonate (RENVELA) 800 MG tablet Take 800 mg by mouth 3 (three) times daily with meals.     simvastatin (ZOCOR) 40 MG tablet Take 40 mg by mouth daily at 6 PM.     sucralfate (CARAFATE) 1 GM/10ML suspension Take 10 mLs (1 g total) 4 (four) times daily -  with meals and at bedtime by mouth. 420 mL 0   thiamine (VITAMIN B-1) 100 MG tablet Take 1 tablet (100 mg total) by mouth daily. 30 tablet 0   warfarin (COUMADIN)  5 MG tablet Take 5 mg by mouth daily.      ZTLIDO 1.8 % PTCH Place 1 patch onto the skin every other day.     BD PEN NEEDLE NANO U/F 32G X 4 MM MISC 3 (three) times daily.      calcitRIOL (ROCALTROL) 0.5 MCG capsule Take 0.5 mcg by mouth daily.       Current Facility-Administered Medications:    0.9 %  sodium chloride infusion, , Intravenous, PRN, Leeanne Rio, MD, Last Rate: 10 mL/hr at 03/25/20 0021, Rate Verify at 03/25/20 0021   acetaminophen (TYLENOL) tablet 650 mg, 650 mg, Oral, Q6H PRN **OR** acetaminophen (TYLENOL) suppository 650 mg, 650 mg, Rectal, Q6H PRN, Zola Button, MD   albuterol (VENTOLIN HFA) 108 (90 Base) MCG/ACT inhaler 2 puff, 2 puff, Inhalation, Q4H PRN, Zola Button, MD   amLODipine (NORVASC) tablet 5 mg, 5 mg, Oral, Daily, Brimage, Vondra, DO, 5 mg at 03/25/20 1311   azithromycin (ZITHROMAX) 500 mg in sodium chloride 0.9 % 250 mL IVPB, 500 mg, Intravenous, Q24H, Brimage, Vondra, DO, Last Rate: 250 mL/hr at 03/25/20 1315, 500 mg at 03/25/20 1315   carvedilol (COREG) tablet 3.125 mg, 3.125 mg, Oral, BID WC, Hammons, Kimberly B, RPH   cefdinir (OMNICEF) capsule 300 mg, 300 mg, Oral, Once, Zola Button, MD   Chlorhexidine Gluconate Cloth 2 % PADS 6 each, 6 each, Topical, q morning - 10a, Leeanne Rio, MD, 6 each at 03/24/20 1046   Darbepoetin Alfa (ARANESP) 40 MCG/0.4ML injection, , , ,    Darbepoetin Alfa (ARANESP) injection 40 mcg, 40 mcg, Intravenous, Q Wed-HD, Lynnda Child, PA-C, 40 mcg at 03/25/20 0921   dextrose 50 % solution 50 mL, 1 ampule, Intravenous, PRN, Zola Button, MD, 50 mL at 03/22/20 1945   heparin lock flush 100 unit/mL, 500 Units, Intracatheter, Once, Zola Button, MD   HYDROcodone-acetaminophen (NORCO/VICODIN) 5-325 MG per tablet 1 tablet, 1 tablet, Oral, Q6H PRN, Brimage, Vondra, DO, 1 tablet at 03/25/20 1311   lidocaine (LIDODERM) 5 % 2 patch, 2 patch, Transdermal, Q48H, Ganta, Anupa, DO, 2 patch at 03/25/20 1313    lidocaine-prilocaine (EMLA) cream 1 application, 1 application, Topical, UD, Sun, Richard, MD   pantoprazole (PROTONIX) EC tablet 40 mg, 40 mg, Oral, BID AC, Ganta, Anupa, DO   polyethylene glycol (MIRALAX / GLYCOLAX) packet 17 g, 17 g, Oral, BID  PRN, Lyndee Hensen, DO   sertraline (ZOLOFT) tablet 50 mg, 50 mg, Oral, Daily, Ganta, Anupa, DO, 50 mg at 03/25/20 1310   sevelamer carbonate (RENVELA) tablet 800 mg, 800 mg, Oral, TID WC, Benay Pike, MD, 800 mg at 03/25/20 1310   simvastatin (ZOCOR) tablet 40 mg, 40 mg, Oral, q1800, Benay Pike, MD   sodium chloride flush (NS) 0.9 % injection 10-40 mL, 10-40 mL, Intracatheter, PRN, Zola Button, MD   thiamine tablet 100 mg, 100 mg, Oral, Daily, Benay Pike, MD, 100 mg at 03/25/20 1311   Warfarin - Pharmacist Dosing Inpatient, , Does not apply, q1600, Hammons, Theone Murdoch, RPH  Assessment: Patient requires anticoagulation with warfarin as patient has history of DVT/PE with protein c deficiency.   Last home dose of warfarin on 10/2.  Pt is currently in therapeutic range with an INR 2.2.  Noted DDI with Azithromycin.  To complete 3d course today. No signs or symptoms of bleeding reported. H/H stable.   Goal of Therapy:  Maintain target INR 2-3.      Plan:  Repeat Warfarin 5mg  tonight for one dose.  Titrate dose accordingly based on daily INR results.  Monitor INR daily.  Monitor for signs of bleeding daily.  Alexios Keown, PharmD-Candidate  03/25/2020,1:30 PM

## 2020-03-25 NOTE — Hospital Course (Addendum)
Ashlee Miles is a 79 y.o. female presenting with altered mental status and hypoglycemia. 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  Altered mental status Patient presented with altered mental status likely secondary to hypoglycemia potentially as a result of exogenous insulin administration in the setting of her missed dialysis session.  Initial blood sugar of 20 on presentation.  CT head demonstrated no acute abnormality.  Received glucagon by EMS and 2 amps of D50 while in the ED. Home insulin was held, patient's CBGs monitored hourly and then less frequently to every 4 hours. Patient received speech evaluation, placed on dysphagia diet. Given patient's mentation was not returning to baseline, MRI performed and demonstrated no acute infarction or acute abnormality, mild chronic microvascular ischemic changes noted. CBGs improved to 128, patient tolerated regular diet well before discharge.    Pulmonary opacities  CXR demonstrated mild, diffuse interstitial pulmonary opacities.  CTA represented potential multifocal pneumonia or pulmonary edema, likely in the setting of a missed hemodialysis session.  Patient remained afebrile throughout her hospitalization with no leukocytosis present.  Patient demonstrated occasional cough and congestion, placed on 4-day ceftriaxone course which was discontinued and replaced with cefdinir 300 mg x1.  Also given 3-day course of azithromycin prior to discharge. Patient completed her full antibiotic course prior to discharge.    All other issues chronic and stable.   Issues for follow up D/c paroxetine and started on sertraline

## 2020-03-25 NOTE — Progress Notes (Signed)
[facility] Progress Note   Subjective:  Seen in dialysis unit this am. Tolerating UF. Feels ok, but endorses continued epigastric pain  Objective Vitals:   03/25/20 1000 03/25/20 1015 03/25/20 1030 03/25/20 1045  BP: (!) 159/41 (!) 153/36 (!) 138/37 (!) 145/60  Pulse:      Resp: 18 17 17 18   Temp:      TempSrc:      SpO2:      Weight:         Additional Objective Labs: Basic Metabolic Panel: Recent Labs  Lab 03/23/20 0900 03/24/20 0500 03/25/20 0555  NA 135 137 137  K 5.5* 4.3 4.7  CL 96* 97* 97*  CO2 26 27 27   GLUCOSE 82 97 173*  BUN 70* 26* 35*  CREATININE 13.80* 7.14* 8.57*  CALCIUM 8.1* 8.4* 8.3*  PHOS 7.0* 4.4 4.8*   CBC: Recent Labs  Lab 03/19/20 1055 03/19/20 1055 03/22/20 1130 03/22/20 1131 03/23/20 0900 03/24/20 0500 03/25/20 0555  WBC 7.9   < > 9.4   < > 9.5 7.8 7.5  NEUTROABS 5.3  --   --   --   --   --   --   HGB 9.8*   < > 10.5*   < > 8.7* 8.9* 8.7*  HCT 31.0*   < > 33.1*   < > 26.5* 27.5* 26.8*  MCV 82.9  --  80.9  --  79.6* 82.1 80.7  PLT 188   < > 283   < > 269 294 294   < > = values in this interval not displayed.   Blood Culture    Component Value Date/Time   SDES URINE, RANDOM 03/19/2020 1021   SPECREQUEST  03/19/2020 1021    NONE Performed at [facility], [place] [address], [place], [place] [address]    CULT MULTIPLE SPECIES PRESENT, SUGGEST RECOLLECTION (A) 03/19/2020 1021   REPTSTATUS 03/20/2020 FINAL 03/19/2020 1021     Physical Exam General: WNWD elderly woman, nad  Heart: RRR Lungs: Clear, bilaterally  Abdomen: soft non-tender  Extremities: No sig LE edema  Dialysis Access: L thigh AVG in use on dialysis   Medications: . sodium chloride 10 mL/hr at 03/25/20 0021  . [START ON 03/26/2020] sodium chloride    . [START ON 03/26/2020] sodium chloride    . azithromycin 500 mg (03/24/20 1711)  . cefTRIAXone (ROCEPHIN)  IV 1 g (03/25/20 0021)   . carvedilol  3.125 mg Oral BID WC  . Chlorhexidine  Gluconate Cloth  6 each Topical q morning - 10a  . Darbepoetin Alfa      . darbepoetin (ARANESP) injection - DIALYSIS  40 mcg Intravenous Q Wed-HD  . heparin lock flush  500 Units Intracatheter Once  . lidocaine  2 patch Transdermal Q48H  . lidocaine-prilocaine  1 application Topical UD  . pantoprazole  40 mg Oral BID AC  . sertraline  50 mg Oral Daily  . sevelamer carbonate  800 mg Oral TID WC  . simvastatin  40 mg Oral q1800  . thiamine  100 mg Oral Daily  . Warfarin - Pharmacist Dosing Inpatient   Does not apply q1600    Dialysis Orders:  MWF at [facility] --> last HD 9/29, only ran 1:46hr of 4hr, but left below EDW 4hr, 400/800, EDW 92.3kg, 2K/2.5Ca, AVG, heparin 3000 unit bolus - Hectoral 66mcg IV q HD - Mircera 5mcg IV q 4 weeks (last 9/15) - Home meds: Sensipar 30mg  on MWF, Renvela  2/meals.  Assessment/Plan: 1. Hypoglycemia: Unclear etiology -- low intake v. insulin misuse. Improving.  Per hospitalist. 2. AMS: Felt d/t #1 -- No acute findings on imaging. Improved.  3. Epigastric v. chest pain: No acute abdominal issues on 9/30 CT -- CTA chest 10/3- no aneurysm/disscetion. Trop - high, trending. EKG without ST changes. No improvement following dialysis.  4. Concern for multifocal PNA on CT - Antibiotics per primary team.  5. ESRD: HD MWF. missed her last HD. Next HD 10/6.  6. Hypertension/volume: BP at goal.  See above, UF as tolerated with next HD - EDW may need to be lowered. 7.  Anemia: Hgb. 8.7. Last ESA 9/15. Will order Aranesp 40 with HD 88/7.  8.  Metabolic bone disease: Ca ok. Restart home binder (Renvela) when eating. 9. T2DM: See #1. On low dose Lantus at home - holding here. Per primary. 10. 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  Lynnda Child PA-C [facility] 03/25/2020,11:06 AM  LOS: 3 days

## 2020-03-26 DIAGNOSIS — E162 Hypoglycemia, unspecified: Secondary | ICD-10-CM

## 2020-03-26 DIAGNOSIS — J189 Pneumonia, unspecified organism: Secondary | ICD-10-CM

## 2020-03-26 LAB — RENAL FUNCTION PANEL
Albumin: 2.3 g/dL — ABNORMAL LOW (ref 3.5–5.0)
Anion gap: 9 (ref 5–15)
BUN: 13 mg/dL (ref 8–23)
CO2: 30 mmol/L (ref 22–32)
Calcium: 8.4 mg/dL — ABNORMAL LOW (ref 8.9–10.3)
Chloride: 96 mmol/L — ABNORMAL LOW (ref 98–111)
Creatinine, Ser: 4.99 mg/dL — ABNORMAL HIGH (ref 0.44–1.00)
GFR calc non Af Amer: 8 mL/min — ABNORMAL LOW (ref >60)
Glucose, Bld: 98 mg/dL (ref 70–99)
Phosphorus: 3.4 mg/dL (ref 2.5–4.6)
Potassium: 3.9 mmol/L (ref 3.5–5.1)
Sodium: 135 mmol/L (ref 135–145)

## 2020-03-26 LAB — CBC
HCT: 27.7 % — ABNORMAL LOW (ref 36.0–46.0)
Hemoglobin: 9.1 g/dL — ABNORMAL LOW (ref 12.0–15.0)
MCH: 26.9 pg (ref 26.0–34.0)
MCHC: 32.9 g/dL (ref 30.0–36.0)
MCV: 82 fL (ref 80.0–100.0)
Platelets: 290 10*3/uL (ref 150–400)
RBC: 3.38 MIL/uL — ABNORMAL LOW (ref 3.87–5.11)
RDW: 14.8 % (ref 11.5–15.5)
WBC: 6 10*3/uL (ref 4.0–10.5)
nRBC: 0 % (ref 0.0–0.2)

## 2020-03-26 LAB — GLUCOSE, CAPILLARY
Glucose-Capillary: 107 mg/dL — ABNORMAL HIGH (ref 70–99)
Glucose-Capillary: 115 mg/dL — ABNORMAL HIGH (ref 70–99)

## 2020-03-26 LAB — PROTIME-INR
INR: 2 — ABNORMAL HIGH (ref 0.8–1.2)
Prothrombin Time: 22.3 seconds — ABNORMAL HIGH (ref 11.4–15.2)

## 2020-03-26 MED ORDER — HEPARIN SOD (PORK) LOCK FLUSH 100 UNIT/ML IV SOLN
500.0000 [IU] | INTRAVENOUS | Status: DC
  Filled 2020-03-26: qty 5

## 2020-03-26 MED ORDER — DARBEPOETIN ALFA 40 MCG/0.4ML IJ SOSY: 40.0000 ug | PREFILLED_SYRINGE | INTRAMUSCULAR | Status: DC

## 2020-03-26 MED ORDER — HEPARIN SOD (PORK) LOCK FLUSH 100 UNIT/ML IV SOLN
500.0000 [IU] | INTRAVENOUS | Status: DC | PRN
  Filled 2020-03-26: qty 5

## 2020-03-26 MED ORDER — SERTRALINE HCL 50 MG PO TABS: 50.0000 mg | ORAL_TABLET | Freq: Every day | ORAL | 0 refills | Status: AC

## 2020-03-26 NOTE — Progress Notes (Signed)
[facility] Progress Note   Subjective:  Seen in room. Says she's doing better. Chest pain improving. Planning for discharge to SNF    Objective Vitals:   03/25/20 1644 03/25/20 2100 03/26/20 0500 03/26/20 0919  BP: (!) 139/43 (!) 138/48 (!) 138/59 (!) 151/56  Pulse:  67 63 74  Resp:  18 18   Temp:  97.6 F (36.4 C) 97.6 F (36.4 C)   TempSrc:  Oral Oral   SpO2:  94% 96%   Weight:   90.5 kg      Additional Objective Labs: Basic Metabolic Panel: Recent Labs  Lab 03/24/20 0500 03/25/20 0555 03/26/20 0438  NA 137 137 135  K 4.3 4.7 3.9  CL 97* 97* 96*  CO2 27 27 30   GLUCOSE 97 173* 98  BUN 26* 35* 13  CREATININE 7.14* 8.57* 4.99*  CALCIUM 8.4* 8.3* 8.4*  PHOS 4.4 4.8* 3.4   CBC: Recent Labs  Lab 03/19/20 1055 03/19/20 1055 03/22/20 1130 03/22/20 1131 03/23/20 0900 03/23/20 0900 03/24/20 0500 03/25/20 0555 03/26/20 0439  WBC 7.9   < > 9.4   < > 9.5   < > 7.8 7.5 6.0  NEUTROABS 5.3  --   --   --   --   --   --   --   --   HGB 9.8*   < > 10.5*   < > 8.7*   < > 8.9* 8.7* 9.1*  HCT 31.0*   < > 33.1*   < > 26.5*   < > 27.5* 26.8* 27.7*  MCV 82.9   < > 80.9  --  79.6*  --  82.1 80.7 82.0  PLT 188   < > 283   < > 269   < > 294 294 290   < > = values in this interval not displayed.   Blood Culture    Component Value Date/Time   SDES URINE, RANDOM 03/19/2020 1021   SPECREQUEST  03/19/2020 1021    NONE Performed at [facility], [place] [address], [place], [place] [address]    CULT MULTIPLE SPECIES PRESENT, SUGGEST RECOLLECTION (A) 03/19/2020 1021   REPTSTATUS 03/20/2020 FINAL 03/19/2020 1021     Physical Exam General: WNWD elderly woman, nad  Heart: RRR Lungs: Clear, bilaterally  Abdomen: soft non-tender  Extremities: No sig LE edema  Dialysis Access: L thigh AVG in use on dialysis   Medications: . sodium chloride 10 mL/hr at 03/25/20 0021   . carvedilol  3.125 mg Oral BID WC  . Chlorhexidine Gluconate Cloth  6 each Topical q  morning - 10a  . darbepoetin (ARANESP) injection - DIALYSIS  40 mcg Intravenous Q Wed-HD  . heparin lock flush  500 Units Intracatheter Once  . lidocaine  2 patch Transdermal Q48H  . lidocaine-prilocaine  1 application Topical UD  . pantoprazole  40 mg Oral BID AC  . sertraline  50 mg Oral Daily  . sevelamer carbonate  800 mg Oral TID WC  . simvastatin  40 mg Oral q1800  . thiamine  100 mg Oral Daily  . Warfarin - Pharmacist Dosing Inpatient   Does not apply q1600    Dialysis Orders:  MWF at [facility] --> last HD 9/29, only ran 1:46hr of 4hr, but left below EDW 4hr, 400/800, EDW 92.3kg, 2K/2.5Ca, AVG, heparin 3000 unit bolus - Hectoral 25mcg IV q HD - Mircera 59mcg IV q 4 weeks (last 9/15) - Home meds: Sensipar 30mg  on MWF,  Renvela 2/meals.  Assessment/Plan: 1. Hypoglycemia: Unclear etiology -- low intake v. insulin misuse. Improving.  Per hospitalist. 2. AMS: Felt d/t #1 -- No acute findings on imaging. Improved.  3. Epigastric v. chest pain: No acute abdominal issues on 9/30 CT -- CTA chest 10/3- no aneurysm/disscetion. Trop - high, trending. EKG without ST changes. No improvement following dialysis.  4. Concern for multifocal PNA on CT - Antibiotics per primary team.  5. ESRD: HD MWF. missed her last HD. Back on schedule. Next HD 10/8 6. Hypertension/volume: BP at goal.  See above, UF as tolerated with next HD - EDW may need to be lowered. 7.  Anemia: Hgb. 8.7. Last ESA 9/15. Will order Aranesp 40 with HD 49/7.  8.  Metabolic bone disease: Ca ok. Restart home binder (Renvela) when eating. 9. T2DM: See #1. On low dose Lantus at home - holding here. Per primary. 10. 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  Lynnda Child PA-C [facility] 03/26/2020,9:33 AM  LOS: 4 days

## 2020-03-26 NOTE — Progress Notes (Signed)
Physical Therapy Treatment Patient Details Name: Ashlee Miles MRN: [id number] DOB: 10-Oct-1940 Today's Date: 03/26/2020    History of Present Illness Patient is a 79 y/o female who presents with AMS, abdominal pain and fall with a blood sugar of 20. Admitted with hypoglycemia. CXR- pulmonary edema secondary to missed dialysis vs multifocal PNA. PMH includes HTN, protein C deficiency, pituitary adenoma, DVT, ESRD on HD, CHF, DM, IVC filter, thrombectomy    PT Comments    Pt was seen for follow up to eval visit with a gait session today.  Her control of standing and balance was better, with minor discomfort hindering her.  Pt is very grateful to get up to walk as she is hoping to finish rehab stay quickly to home.  Follow acutely as her stay dictates.  Focus on strength and standing balance to increase independent steps on RW.  Follow Up Recommendations  SNF     Equipment Recommendations  None recommended by PT    Recommendations for Other Services       Precautions / Restrictions Precautions Precautions: Fall Precaution Comments: monitor pulse and sats Restrictions Weight Bearing Restrictions: No    Mobility  Bed Mobility Overal bed mobility: Needs Assistance Bed Mobility: Sit to Supine;Supine to Sit Rolling: Mod assist   Supine to sit: Mod assist Sit to supine: Mod assist      Transfers Overall transfer level: Needs assistance Equipment used: Rolling walker (2 wheeled);1 person hand held assist Transfers: Sit to/from Stand Sit to Stand: Mod assist            Ambulation/Gait Ambulation/Gait assistance: Min assist Gait Distance (Feet): 20 Feet (13+7) Assistive device: Rolling walker (2 wheeled);1 person hand held assist Gait Pattern/deviations: Step-to pattern;Decreased stride length Gait velocity: reduced Gait velocity interpretation: <1.31 ft/sec, indicative of household ambulator General Gait Details: sidesteps side of bed   Stairs              Wheelchair Mobility    Modified Rankin (Stroke Patients Only)       Balance Overall balance assessment: Needs assistance Sitting-balance support: Feet supported Sitting balance-[name]: Fair     Standing balance support: Bilateral upper extremity supported Standing balance-[name]: Poor                              Cognition Arousal/Alertness: Awake/alert Behavior During Therapy: WFL for tasks assessed/performed Overall Cognitive Status: Within Functional Limits for tasks assessed                                        Exercises      General Comments General comments (skin integrity, edema, etc.): stable sitting pulses and sats      Pertinent Vitals/Pain Pain Assessment: Faces Faces Pain Scale: Hurts little more Pain Location: back Pain Descriptors / Indicators: Aching Pain Intervention(s): Monitored during session;Repositioned    Home Living                      Prior Function            PT Goals (current goals can now be found in the care plan section) Progress towards PT goals: Progressing toward goals    Frequency    Min 2X/week      PT Plan Current plan remains appropriate    Co-evaluation  AM-PAC PT "6 Clicks" Mobility   Outcome Measure  Help needed turning from your back to your side while in a flat bed without using bedrails?: A Lot Help needed moving from lying on your back to sitting on the side of a flat bed without using bedrails?: A Lot Help needed moving to and from a bed to a chair (including a wheelchair)?: A Lot Help needed standing up from a chair using your arms (e.g., wheelchair or bedside chair)?: A Lot Help needed to walk in hospital room?: A Lot Help needed climbing 3-5 steps with a railing? : A Lot 6 Click Score: 12    End of Session Equipment Utilized During Treatment: Gait belt Activity Tolerance: Patient limited by fatigue Patient left: in bed;with  call bell/phone within reach;with bed alarm set Nurse Communication: Mobility status PT Visit Diagnosis: Muscle weakness (generalized) (M62.81);Unsteadiness on feet (R26.81) Pain - part of body:  (back)     Time: 1110-1135 PT Time Calculation (min) (ACUTE ONLY): 25 min  Charges:  $Gait Training: 8-22 mins $Therapeutic Activity: 8-22 mins                   Ramond Dial 03/26/2020, 11:06 PM  Mee Hives, PT MS Acute Rehab Dept. Number: [place] and [place]

## 2020-03-26 NOTE — Discharge Summary (Signed)
[facility] Discharge Summary  Patient name: Ashlee Miles Medical record number: [id number] Date of birth: [birth year] Age: 79 y.o. Gender: female Date of Admission: 03/22/2020  Date of Discharge: 03/26/2020 Admitting Physician: Benay Pike, MD  Primary Care Provider: [name], MD Consultants: Nephrology   Indication for Hospitalization: altered mental status and hypoglycemia   Discharge Diagnoses/Problem List:  Altered mental status Hypoglycemia  ESRD Hypertension Constipation  Chronic pain syndrome/degnerative disc disease Pulmonary opacities suggestive of possible pneumonia  Type 2 DM HFpEF Hx of DVT/PE Depression GERD COPD   Disposition: SNF  Discharge Condition: medically stable   Discharge Exam:   Physical exam General: Patient is well-appearing, in no acute distress. HEENT: normocephalic, atraumatic, supple neck CV: RRR, no murmurs or gallops auscultated  Resp: lungs clear to auscultation bilaterally  Abdomen: soft, mild generalized tenderness improves with BMs, presence of active bowel sounds Ext: radial and distal pulses intact bilaterally, 1+ pitting LE edema bilaterally  Neuro: AOx1 (self), CN 2-12 grossly intact, 4/5 strength left LE, 5/5 strength UE bilaterally and right LE bilaterally, gross sensation intact, cerebellar function testing normal Psych: mood appropriate   Brief Hospital Course:  Ashlee Miles is a 79 y.o. female presenting with altered mental status and hypoglycemia. 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  Altered mental status Patient presented with altered mental status likely secondary to hypoglycemia potentially as a result of exogenous insulin administration in the setting of her missed dialysis session.  Initial blood sugar of 20 on presentation.  CT head demonstrated no acute abnormality.  Received  glucagon by EMS and 2 amps of D50 while in the ED. Home insulin was held, patient's CBGs monitored hourly and then less frequently to every 4 hours. Patient received speech evaluation, placed on dysphagia diet. Given patient's mentation was not returning to baseline, MRI performed and demonstrated no acute infarction or acute abnormality, mild chronic microvascular ischemic changes noted. After her MRI pt mental status improved significantly.  CBGs improved to 128, patient tolerated regular diet well before discharge.    Pulmonary opacities  CXR demonstrated mild, diffuse interstitial pulmonary opacities.  CTA represented potential multifocal pneumonia or pulmonary edema, likely in the setting of a missed hemodialysis session.  Patient remained afebrile throughout her hospitalization with no leukocytosis present.  Patient demonstrated occasional cough and congestion, placed on 4-day ceftriaxone course which was discontinued and replaced with cefdinir 300 mg x1.  Also given 3-day course of azithromycin prior to discharge. Patient completed her full antibiotic course prior to discharge.    All other issues chronic and stable.  Issues for Follow Up:  1. Discontinued paroxetine and started on sertraline 50 mg. Follow up with PCP to reassess and make changes as needed. 2. Insulin held.  Do not continue on discharge.  Monitor CBG daily.  If they become consistently elevated (>180), consider oral medication such as linagliptin.  3. Isolated systolic hypertension.  Diastolic < 60. Amlodipine was held on discharge.  4. Pulmonary opacities - treated for presumed pna based on altered status and imaging but pt was asymptomatic otherwise.  Consider CXR in 4-6 weeks.   5. Hx of DVT/PE  And protein C deficiency.  On warfarin 5mg  daily at home.  Continue home dose and adjust accordingly based on INR.    Significant Procedures:  None  Significant Labs and Imaging:  Recent Labs  Lab 03/24/20 0500 03/25/20 0555  03/26/20 0439  WBC 7.8 7.5 6.0  HGB 8.9* 8.7* 9.1*  HCT 27.5* 26.8* 27.7*  PLT 294 294 290   Recent Labs  Lab 03/22/20 1130 03/22/20 1130 03/22/20 1131 03/22/20 1131 03/23/20 0900 03/23/20 0900 03/24/20 0500 03/24/20 0500 03/25/20 0555 03/26/20 0438  NA 136   < > 136  --  135  --  137  --  137 135  K 5.5*   < > 5.4*   < > 5.5*   < > 4.3   < > 4.7 3.9  CL 95*  --   --   --  96*  --  97*  --  97* 96*  CO2 26  --   --   --  26  --  27  --  27 30  GLUCOSE 69*  --   --   --  82  --  97  --  173* 98  BUN 62*  --   --   --  70*  --  26*  --  35* 13  CREATININE 12.35*  --   --   --  13.80*  --  7.14*  --  8.57* 4.99*  CALCIUM 8.3*  --   --   --  8.1*  --  8.4*  --  8.3* 8.4*  PHOS  --   --   --   --  7.0*  --  4.4  --  4.8* 3.4  ALKPHOS 49  --   --   --   --   --   --   --   --   --   AST 16  --   --   --   --   --   --   --   --   --   ALT 8  --   --   --   --   --   --   --   --   --   ALBUMIN 2.7*  --   --   --  2.4*  --  2.4*  --  2.4* 2.3*   < > = values in this interval not displayed.      Results/Tests Pending at Time of Discharge:  No results found.  Discharge Medications:  Allergies as of 03/26/2020   No Known Allergies     Medication List    STOP taking these medications   collagenase ointment Commonly known as: SANTYL   HYDROcodone-acetaminophen 5-325 MG tablet Commonly known as: NORCO/VICODIN   insulin aspart protamine- aspart (70-30) 100 UNIT/ML injection Commonly known as: NOVOLOG MIX 70/30   PARoxetine 10 MG tablet Commonly known as: PAXIL     TAKE these medications   albuterol 108 (90 Base) MCG/ACT inhaler Commonly known as: VENTOLIN HFA Inhale 2 puffs into the lungs every 4 (four) hours as needed for wheezing or shortness of breath.   albuterol (2.5 MG/3ML) 0.083% nebulizer solution Commonly known as: PROVENTIL Take 3 mLs (2.5 mg total) by nebulization every 4 (four) hours as needed for wheezing or shortness of breath. Diagnosis COPD J44.9    BD Pen Needle Nano U/F 32G X 4 MM Misc Generic drug: Insulin Pen Needle 3 (three) times daily.   calcitRIOL 0.5 MCG capsule Commonly known as: ROCALTROL Take 0.5 mcg by mouth daily.   carvedilol 3.125 MG tablet Commonly known as: COREG Take 3.125 mg by mouth 2 (two) times daily with a meal.   cinacalcet 30 MG tablet Commonly known as: SENSIPAR  Take 30 mg by mouth 3 (three) times a week.   Darbepoetin Alfa 40 MCG/0.4ML Sosy injection Commonly known as: ARANESP Inject 0.4 mLs (40 mcg total) into the vein every Wednesday with hemodialysis. Start taking on: April 01, 2020   docusate sodium 100 MG capsule Commonly known as: COLACE Take 1 capsule (100 mg total) by mouth 2 (two) times daily.   famotidine 20 MG tablet Commonly known as: PEPCID Take 1 tablet (20 mg total) daily by mouth.   feeding supplement (PRO-STAT SUGAR FREE 64) Liqd Take 30 mLs by mouth 2 (two) times daily.   gabapentin 100 MG capsule Commonly known as: NEURONTIN Take 100 mg by mouth at bedtime.   lidocaine-prilocaine cream Commonly known as: EMLA Apply 1 application topically as directed.   midodrine 10 MG tablet Commonly known as: PROAMATINE Take 1 tablet (10 mg total) by mouth every Monday, Wednesday, and Friday with hemodialysis.   multivitamin Tabs tablet Take 1 tablet by mouth at bedtime.   Oxycodone HCl 10 MG Tabs Take 10 mg by mouth every 6 (six) hours as needed (pain).   pantoprazole 40 MG tablet Commonly known as: PROTONIX Take 1 tablet (40 mg total) 2 (two) times daily before a meal by mouth.   polyethylene glycol 17 g packet Commonly known as: MIRALAX / GLYCOLAX Take 17 g by mouth daily.   sertraline 50 MG tablet Commonly known as: ZOLOFT Take 1 tablet (50 mg total) by mouth daily.   sevelamer carbonate 800 MG tablet Commonly known as: RENVELA Take 800 mg by mouth 3 (three) times daily with meals.   simvastatin 40 MG tablet Commonly known as: ZOCOR Take 40 mg by mouth  daily at 6 PM.   sucralfate 1 GM/10ML suspension Commonly known as: CARAFATE Take 10 mLs (1 g total) 4 (four) times daily -  with meals and at bedtime by mouth.   thiamine 100 MG tablet Commonly known as: Vitamin B-1 Take 1 tablet (100 mg total) by mouth daily.   warfarin 5 MG tablet Commonly known as: COUMADIN Take 5 mg by mouth daily.   ZTlido 1.8 % Ptch Generic drug: Lidocaine Place 1 patch onto the skin every other day.            Discharge Care Instructions  (From admission, onward)         Start     Ordered   03/26/20 0000  Discharge wound care:       Comments: Stage 1 pressure ulcers noted on right heel and sacrum.  Standard pressure ulcer treatment: offloading, repositioning.   03/26/20 0932          Discharge Instructions: Please refer to Patient Instructions section of EMR for full details.  Patient was counseled important signs and symptoms that should prompt return to medical care, changes in medications, dietary instructions, activity restrictions, and follow up appointments.   Follow-Up Appointments:  Contact information for follow-up providers    [name], MD Follow up.   Specialty: Internal Medicine Why: Follow up with PCP at your next scheduled appointment. Contact information: [address] [place] [address] [phone]            Contact information for after-discharge care    Destination    [facility] Preferred SNF .   Service: Skilled Nursing Contact information: [address] [place] [facility] [phone]                  Benay Pike, MD  03/26/2020, 12:43 PM PGY-1, [place]

## 2020-03-26 NOTE — Discharge Instructions (Signed)
You were admitted due to confusion and altered mental status that was likely due to multiple factors including low blood sugar levels. While you were hospitalized, you received dialysis per your normal Monday, Wednesday and Friday schedule. We also corrected your sugar levels by giving you sugar containing fluids and monitoring your glucose levels very closely which eventually normalized to more appropriate levels. While you were here, we discontinued your paroxetine and replaced it with sertraline 50 mg daily. Please follow up with your PCP at your next scheduled appointment. Thank you for allowing [place] to be a part of your medical care!

## 2020-03-26 NOTE — TOC Transition Note (Signed)
Transition of Care [facility]) - CM/SW Discharge Note   Patient Details  Name: Ashlee Miles MRN: [id number] Date of Birth: Jan 11, 1941  [facility] CM/SW Contact:  Trula Ore, [place] Phone Number: 03/26/2020, 1:32 PM   Clinical Narrative:     Patient will DC to: [facility]   Anticipated DC date: 03/26/2020  Family notified: [name]  Transport by: Corey Harold  ?  Per MD patient ready for DC to [facility]. RN, patient, patient's family, Renal Navigator,and facility notified of DC. Discharge Summary sent to facility. RN given number for report tele#[phone] RM#[phone]. DC packet on chart. Ambulance transport requested for patient.  CSW signing off.  Final next level of care: Skilled Nursing Facility Barriers to Discharge: No Barriers Identified   Patient Goals and CMS Choice Patient states their goals for this hospitalization and ongoing recovery are:: to go to SNF CMS Medicare.gov Compare Post Acute Care list provided to:: Patient Represenative (must comment) (Patients daughter [name]) Choice offered to / list presented to : Adult Children (patients daughter angela)  Discharge Placement              Patient chooses bed at: [facility] Patient to be transferred to facility by: [place] Name of family member notified: [name] Patient and family notified of of transfer: 03/26/20  Discharge Plan and Services                                     Social Determinants of Health (SDOH) Interventions     Readmission Risk Interventions Readmission Risk Prevention Plan 03/25/2020  Transportation Screening Complete  Palliative Care Screening Not Applicable  Skilled Nursing Facility Complete  Some recent data might be hidden

## 2020-03-26 NOTE — Progress Notes (Signed)
Attempted to call report to [facility] x 2

## 2020-04-09 ENCOUNTER — Encounter (HOSPITAL_COMMUNITY): Payer: 59

## 2020-04-09 ENCOUNTER — Ambulatory Visit: Payer: 59

## 2020-04-13 ENCOUNTER — Encounter (HOSPITAL_COMMUNITY): Payer: 59

## 2020-04-14 ENCOUNTER — Encounter (HOSPITAL_COMMUNITY): Payer: 59

## 2020-04-28 ENCOUNTER — Other Ambulatory Visit: Payer: Self-pay

## 2020-04-28 ENCOUNTER — Encounter: Payer: Self-pay | Admitting: Podiatry

## 2020-04-28 ENCOUNTER — Ambulatory Visit (INDEPENDENT_AMBULATORY_CARE_PROVIDER_SITE_OTHER): Payer: 59 | Admitting: Podiatry

## 2020-04-28 DIAGNOSIS — B351 Tinea unguium: Secondary | ICD-10-CM | POA: Diagnosis not present

## 2020-04-28 DIAGNOSIS — D689 Coagulation defect, unspecified: Secondary | ICD-10-CM

## 2020-04-28 DIAGNOSIS — E1142 Type 2 diabetes mellitus with diabetic polyneuropathy: Secondary | ICD-10-CM

## 2020-04-28 DIAGNOSIS — M79675 Pain in left toe(s): Secondary | ICD-10-CM | POA: Diagnosis not present

## 2020-04-28 DIAGNOSIS — M79674 Pain in right toe(s): Secondary | ICD-10-CM

## 2020-04-28 DIAGNOSIS — I96 Gangrene, not elsewhere classified: Secondary | ICD-10-CM | POA: Insufficient documentation

## 2020-04-28 NOTE — Progress Notes (Signed)
This patient returns to my office for at risk foot care.  This patient requires this care by a professional since this patient will be at risk due to having ESRD and history of DVT and diabetes.  This patient is unable to cut nails herself since the patient cannot reach her nails.These nails are painful walking and wearing shoes. This patient accompanies his mother to the office.  He says there is also a black area on the bottom of his moms right heel.  This area was noted at the last visit and was dispensed a heel cushion to prevent the worsening of this skin lesion.  The son says his mother wore the cushion.  This patient presents for at risk foot care today.  General Appearance  Alert, conversant and in no acute stress.  Vascular  Dorsalis pedis and posterior tibial  pulses are weakly  palpable  bilaterally.  Capillary return is within normal limits  bilaterally. Temperature is within normal limits  bilaterally.  Neurologic  Senn-Weinstein monofilament wire test within normal limits  bilaterally. Muscle power within normal limits bilaterally.  Nails Thick disfigured discolored nails with subungual debris  from hallux to fifth toes bilaterally. No evidence of bacterial infection or drainage bilaterally.  Orthopedic  No limitations of motion  feet .  No crepitus or effusions noted.  No bony pathology or digital deformities noted.  Skin  normotropic skin with no porokeratosis noted bilaterally.  No signs of infections or ulcers noted.   Skin necrosis right heel.  Onychomycosis  Pain in right toes  Pain in left toes  Skin necrosis right heel.  Consent was obtained for treatment procedures.   Mechanical debridement of nails 1-5  bilaterally performed with a nail nipper.  Filed with dremel without incident.  Debrided black skin necrosis on the lateral aspect  entire heel necrosis right foot. with a # 15 blade.  Neosporin/DSD applied to right heel.  Told the son to make sure she wears the heel cushion to  prevent the worsening of the skin necrosis.   Return office visit   3 months                  Told patient to return for periodic foot care and evaluation due to potential at risk complications.   Gardiner Barefoot DPM

## 2020-04-30 ENCOUNTER — Ambulatory Visit
Admission: RE | Admit: 2020-04-30 | Discharge: 2020-04-30 | Disposition: A | Discharge status: Home / Self Care | Payer: 59 | Source: Ambulatory Visit | Attending: Internal Medicine | Admitting: Internal Medicine

## 2020-04-30 ENCOUNTER — Other Ambulatory Visit: Payer: Self-pay

## 2020-04-30 DIAGNOSIS — Z1231 Encounter for screening mammogram for malignant neoplasm of breast: Secondary | ICD-10-CM

## 2020-05-19 ENCOUNTER — Encounter (HOSPITAL_COMMUNITY): Payer: 59

## 2020-05-21 ENCOUNTER — Other Ambulatory Visit: Payer: Self-pay

## 2020-05-21 ENCOUNTER — Ambulatory Visit (HOSPITAL_COMMUNITY)
Admission: RE | Admit: 2020-05-21 | Discharge: 2020-05-21 | Disposition: A | Discharge status: Home / Self Care | Payer: 59 | Source: Ambulatory Visit | Attending: Internal Medicine | Admitting: Internal Medicine

## 2020-05-21 DIAGNOSIS — Z452 Encounter for adjustment and management of vascular access device: Secondary | ICD-10-CM | POA: Diagnosis not present

## 2020-05-21 MED ORDER — SODIUM CHLORIDE 0.9% FLUSH
10.0000 mL | INTRAVENOUS | Status: AC | PRN
  Administered 2020-05-21: 10 mL

## 2020-05-21 MED ORDER — HEPARIN SOD (PORK) LOCK FLUSH 100 UNIT/ML IV SOLN
500.0000 [IU] | INTRAVENOUS | Status: AC | PRN
  Administered 2020-05-21: 500 [IU]

## 2020-05-21 NOTE — Progress Notes (Signed)
Implanted port was flushed with 500 units of heparin and 10 cc of normal salineas ordered by [name] MD. Port accessed and de-accessed per protocol. Patient tolerated well, alert, oriented and transported in a wheelchair.

## 2020-05-26 ENCOUNTER — Other Ambulatory Visit: Payer: Self-pay

## 2020-05-26 ENCOUNTER — Encounter (HOSPITAL_BASED_OUTPATIENT_CLINIC_OR_DEPARTMENT_OTHER): Payer: 59 | Attending: Internal Medicine | Admitting: Internal Medicine

## 2020-05-26 DIAGNOSIS — E11621 Type 2 diabetes mellitus with foot ulcer: Secondary | ICD-10-CM | POA: Diagnosis not present

## 2020-05-26 DIAGNOSIS — I120 Hypertensive chronic kidney disease with stage 5 chronic kidney disease or end stage renal disease: Secondary | ICD-10-CM | POA: Insufficient documentation

## 2020-05-26 DIAGNOSIS — L97418 Non-pressure chronic ulcer of right heel and midfoot with other specified severity: Secondary | ICD-10-CM | POA: Diagnosis not present

## 2020-05-26 DIAGNOSIS — N186 End stage renal disease: Secondary | ICD-10-CM | POA: Diagnosis not present

## 2020-05-26 DIAGNOSIS — E1122 Type 2 diabetes mellitus with diabetic chronic kidney disease: Secondary | ICD-10-CM | POA: Insufficient documentation

## 2020-05-26 DIAGNOSIS — Z992 Dependence on renal dialysis: Secondary | ICD-10-CM | POA: Insufficient documentation

## 2020-05-26 DIAGNOSIS — L97419 Non-pressure chronic ulcer of right heel and midfoot with unspecified severity: Secondary | ICD-10-CM | POA: Diagnosis present

## 2020-05-26 NOTE — Progress Notes (Signed)
Ashlee Miles, Ashlee Miles ([id number]) Visit Report for 05/26/2020 Allergy List Details Patient Name: Date of Service: Ashlee Miles, Ashlee Miles 05/26/2020 2:45 PM Medical Record Number: [id number] Patient Account Number: [id number] Date of Birth/Sex: Treating RN: [birth year] (79 y.o. Elam Dutch Primary Care Jiovany Scheffel: [name] Other Clinician: Referring Roxanna Mcever: Treating Ladarrius Bogdanski/Extender: Nida Boatman in Treatment: 0 Allergies Active Allergies No Known Allergies Allergy Notes Electronic Signature(s) Signed: 05/26/2020 5:33:51 PM By: Baruch Gouty RN, BSN Entered By: Baruch Gouty on 05/26/2020 15:38:57 -------------------------------------------------------------------------------- Arrival Information Details Patient Name: Date of Service: Ashlee Miles, Ashlee Miles 05/26/2020 2:45 PM Medical Record Number: [id number] Patient Account Number: [id number] Date of Birth/Sex: Treating RN: 1941/05/22 (79 y.o. Elam Dutch Primary Care Makynli Stills: [name] Other Clinician: Referring Gwendolyn Mclees: Treating Jeanann Balinski/Extender: Nida Boatman in Treatment: 0 Visit Information Patient Arrived: Wheel Chair Arrival Time: 15:29 Accompanied By: daughter Transfer Assistance: None Patient Identification Verified: Yes Secondary Verification Process Completed: Yes Patient Requires Transmission-Based Precautions: No Patient Has Alerts: No Electronic Signature(s) Signed: 05/26/2020 5:33:51 PM By: Baruch Gouty RN, BSN Entered By: Baruch Gouty on 05/26/2020 15:35:59 -------------------------------------------------------------------------------- Clinic Level of Care Assessment Details Patient Name: Date of Service: Ashlee Miles, Ashlee Miles 05/26/2020 2:45 PM Medical Record Number: [id number] Patient Account Number: [id number] Date of Birth/Sex: Treating RN: [birth year] (79 y.o. Orvan Falconer Primary Care Kejon Feild: [name] Other Clinician: Referring  Jorah Hua: Treating Adele Milson/Extender: Nida Boatman in Treatment: 0 Clinic Level of Care Assessment Items TOOL 2 Quantity Score X- 1 0 Use when only an EandM is performed on the INITIAL visit ASSESSMENTS - Nursing Assessment / Reassessment X- 1 20 General Physical Exam (combine w/ comprehensive assessment (listed just below) when performed on new pt. evals) X- 1 25 Comprehensive Assessment (HX, ROS, Risk Assessments, Wounds Hx, etc.) ASSESSMENTS - Wound and Skin A ssessment / Reassessment []  - 0 Simple Wound Assessment / Reassessment - one wound []  - 0 Complex Wound Assessment / Reassessment - multiple wounds []  - 0 Dermatologic / Skin Assessment (not related to wound area) ASSESSMENTS - Ostomy and/or Continence Assessment and Care []  - 0 Incontinence Assessment and Management []  - 0 Ostomy Care Assessment and Management (repouching, etc.) PROCESS - Coordination of Care X - Simple Patient / Family Education for ongoing care 1 15 []  - 0 Complex (extensive) Patient / Family Education for ongoing care X- 1 10 Staff obtains Programmer, systems, Records, T Results / Process Orders est []  - 0 Staff telephones HHA, Nursing Homes / Clarify orders / etc []  - 0 Routine Transfer to another Facility (non-emergent condition) []  - 0 Routine Hospital Admission (non-emergent condition) X- 1 15 New Admissions / Biomedical engineer / Ordering NPWT Apligraf, etc. , []  - 0 Emergency Hospital Admission (emergent condition) X- 1 10 Simple Discharge Coordination []  - 0 Complex (extensive) Discharge Coordination PROCESS - Special Needs []  - 0 Pediatric / Minor Patient Management []  - 0 Isolation Patient Management []  - 0 Hearing / Language / Visual special needs []  - 0 Assessment of Community assistance (transportation, D/C planning, etc.) []  - 0 Additional assistance / Altered mentation []  - 0 Support Surface(s) Assessment (bed, cushion, seat, etc.) INTERVENTIONS  - Wound Cleansing / Measurement []  - 0 Wound Imaging (photographs - any number of wounds) []  - 0 Wound Tracing (instead of photographs) []  - 0 Simple Wound Measurement - one wound []  - 0 Complex Wound Measurement - multiple wounds []  - 0 Simple Wound Cleansing - one wound []  - 0 Complex Wound Cleansing -  multiple wounds INTERVENTIONS - Wound Dressings []  - 0 Small Wound Dressing one or multiple wounds []  - 0 Medium Wound Dressing one or multiple wounds []  - 0 Large Wound Dressing one or multiple wounds []  - 0 Application of Medications - injection INTERVENTIONS - Miscellaneous []  - 0 External ear exam []  - 0 Specimen Collection (cultures, biopsies, blood, body fluids, etc.) []  - 0 Specimen(s) / Culture(s) sent or taken to Lab for analysis []  - 0 Patient Transfer (multiple staff / Harrel Lemon Lift / Similar devices) []  - 0 Simple Staple / Suture removal (25 or less) []  - 0 Complex Staple / Suture removal (26 or more) []  - 0 Hypo / Hyperglycemic Management (close monitor of Blood Glucose) []  - 0 Ankle / Brachial Index (ABI) - do not check if billed separately Has the patient been seen at the hospital within the last three years: Yes Total Score: 95 Level Of Care: New/Established - Level 3 Electronic Signature(s) Signed: 05/26/2020 5:28:36 PM By: Carlene Coria RN Entered By: Carlene Coria on 05/26/2020 16:40:40 -------------------------------------------------------------------------------- Encounter Discharge Information Details Patient Name: Date of Service: Ashlee Miles, Ashlee Miles 05/26/2020 2:45 PM Medical Record Number: [id number] Patient Account Number: [id number] Date of Birth/Sex: Treating RN: 11/27/1940 (79 y.o. Helene Shoe, Tammi Klippel Primary Care Lavonda Thal: [name] Other Clinician: Referring Orissa Arreaga: Treating Juanda Luba/Extender: Nida Boatman in Treatment: 0 Encounter Discharge Information Items Discharge Condition: Stable Ambulatory Status:  Wheelchair Discharge Destination: Home Transportation: Private Auto Accompanied By: daughter Schedule Follow-up Appointment: Yes Clinical Summary of Care: Electronic Signature(s) Signed: 05/26/2020 4:58:51 PM By: Deon Pilling Entered By: Deon Pilling on 05/26/2020 16:58:32 -------------------------------------------------------------------------------- Lower Extremity Assessment Details Patient Name: Date of Service: Ashlee Miles, Ashlee Miles 05/26/2020 2:45 PM Medical Record Number: [id number] Patient Account Number: [id number] Date of Birth/Sex: Treating RN: 05/20/1941 (79 y.o. Elam Dutch Primary Care Jasmeet Manton: [name] Other Clinician: Referring Nickie Deren: Treating Jewett Mcgann/Extender: Nida Boatman in Treatment: 0 Edema Assessment Assessed: Shirlyn Goltz: No] Patrice Paradise: No] Edema: [Left: Ye] [Right: s] Vascular Assessment Pulses: Dorsalis Pedis Palpable: [Right:Yes] Electronic Signature(s) Signed: 05/26/2020 5:33:51 PM By: Baruch Gouty RN, BSN Entered By: Baruch Gouty on 05/26/2020 16:00:04 -------------------------------------------------------------------------------- Multi-Disciplinary Care Plan Details Patient Name: Date of Service: Ashlee Miles, Ashlee Miles 05/26/2020 2:45 PM Medical Record Number: [id number] Patient Account Number: [id number] Date of Birth/Sex: Treating RN: [birth year] (79 y.o. Orvan Falconer Primary Care Anadelia Kintz: [name] Other Clinician: Referring Jimi Schappert: Treating Dandrea Medders/Extender: Nida Boatman in Treatment: 0 Active Inactive Electronic Signature(s) Signed: 05/26/2020 5:28:36 PM By: Carlene Coria RN Entered By: Carlene Coria on 05/26/2020 16:36:58 -------------------------------------------------------------------------------- Pain Assessment Details Patient Name: Date of Service: Ashlee Miles, Ashlee Miles 05/26/2020 2:45 PM Medical Record Number: [id number] Patient Account Number: [id number] Date of  Birth/Sex: Treating RN: [birth year] (79 y.o. Elam Dutch Primary Care Penina Reisner: [name] Other Clinician: Referring Caydee Talkington: Treating Dylynn Ketner/Extender: Nida Boatman in Treatment: 0 Active Problems Location of Pain Severity and Description of Pain Patient Has Paino No Site Locations Rate the pain. Rate the pain. Current Pain Level: 0 Worst Pain Level: 4 Character of Pain Describe the Pain: Tender Pain Management and Medication Current Pain Management: Electronic Signature(s) Signed: 05/26/2020 5:33:51 PM By: Baruch Gouty RN, BSN Entered By: Baruch Gouty on 05/26/2020 16:00:30 -------------------------------------------------------------------------------- Patient/Caregiver Education Details Patient Name: Date of Service: Ashlee Miles, Ashlee Miles 12/7/2021andnbsp2:45 PM Medical Record Number: [id number] Patient Account Number: [id number] Date of Birth/Gender: Treating RN: [birth year] (79 y.o. Orvan Falconer Primary Care Physician: [name] Other Clinician: Referring Physician:  Treating Physician/Extender: Nida Boatman in Treatment: 0 Education Assessment Education Provided To: Patient Education Topics Provided Wound/Skin Impairment: Methods: Explain/Verbal Responses: State content correctly Electronic Signature(s) Signed: 05/26/2020 5:28:36 PM By: Carlene Coria RN Entered By: Carlene Coria on 05/26/2020 16:37:22 -------------------------------------------------------------------------------- [place] Details Patient Name: Date of Service: Ashlee Miles, Ashlee Miles 05/26/2020 2:45 PM Medical Record Number: [id number] Patient Account Number: [id number] Date of Birth/Sex: Treating RN: Mar 30, 1941 (79 y.o. Elam Dutch Primary Care Brinsley Wence: Other Clinician: [name] Referring Corde Antonini: Treating Chermaine Schnyder/Extender: Nida Boatman in Treatment: 0 Vital Signs Time Taken:  15:36 Temperature (F): 98.1 Height (in): 63 Pulse (bpm): 69 Source: Stated Respiratory Rate (breaths/min): 18 Weight (lbs): 207 Blood Pressure (mmHg): 136/55 Source: Stated Capillary Blood Glucose (mg/dl): 173 Body Mass Index (BMI): 36.7 Reference Range: 80 - 120 mg / dl Electronic Signature(s) Signed: 05/26/2020 5:33:51 PM By: Baruch Gouty RN, BSN Entered By: Baruch Gouty on 05/26/2020 15:38:27

## 2020-05-26 NOTE — Progress Notes (Signed)
ETHLYN, ALTO ([id number]) Visit Report for 05/26/2020 Abuse/Suicide Risk Screen Details Patient Name: Date of Service: RAGAN, DUHON 05/26/2020 2:45 PM Medical Record Number: [id number] Patient Account Number: [id number] Date of Birth/Sex: Treating RN: [birth year] (79 y.o. Elam Dutch Primary Care Lesbia Ottaway: [name] Other Clinician: Referring Andreas Sobolewski: Treating Gabbrielle Mcnicholas/Extender: Nida Boatman in Treatment: 0 Abuse/Suicide Risk Screen Items Answer ABUSE RISK SCREEN: Has anyone close to you tried to hurt or harm you recentlyo No Do you feel uncomfortable with anyone in your familyo No Has anyone forced you do things that you didnt want to doo No Electronic Signature(s) Signed: 05/26/2020 5:33:51 PM By: Baruch Gouty RN, BSN Entered By: Baruch Gouty on 05/26/2020 15:55:18 -------------------------------------------------------------------------------- Activities of Daily Living Details Patient Name: Date of Service: DOREENA, MAULDEN 05/26/2020 2:45 PM Medical Record Number: [id number] Patient Account Number: [id number] Date of Birth/Sex: Treating RN: 1940/12/28 (79 y.o. Elam Dutch Primary Care Del Overfelt: [name] Other Clinician: Referring Rasheena Talmadge: Treating Olaf Mesa/Extender: Nida Boatman in Treatment: 0 Activities of Daily Living Items Answer Activities of Daily Living (Please select one for each item) Drive Automobile Not Able T Medications ake Need Assistance Use T elephone Completely Able Care for Appearance Need Assistance Use T oilet Completely Able Bath / Shower Need Assistance Dress Self Need Assistance Feed Self Completely Able Walk Need Assistance Get In / Out Bed Need Assistance Housework Need Assistance Prepare Meals Need Assistance Handle Money Need Assistance Shop for Self Need Assistance Electronic Signature(s) Signed: 05/26/2020 5:33:51 PM By: Baruch Gouty RN, BSN Entered By:  Baruch Gouty on 05/26/2020 15:56:17 -------------------------------------------------------------------------------- Education Screening Details Patient Name: Date of Service: CAMORA, TREMAIN 05/26/2020 2:45 PM Medical Record Number: [id number] Patient Account Number: [id number] Date of Birth/Sex: Treating RN: 04-Jul-1940 (79 y.o. Elam Dutch Primary Care Folashade Gamboa: [name] Other Clinician: Referring Refugia Laneve: Treating Teara Duerksen/Extender: Nida Boatman in Treatment: 0 Primary Learner Assessed: Patient Learning Preferences/Education Level/Primary Language Learning Preference: Explanation, Demonstration, Printed Material Highest Education Level: High School Preferred Language: English Cognitive Barrier Language Barrier: No Translator Needed: No Memory Deficit: No Emotional Barrier: No Cultural/Religious Beliefs Affecting Medical Care: No Physical Barrier Impaired Vision: Yes Glasses Impaired Hearing: No Decreased Hand dexterity: No Knowledge/Comprehension Knowledge Level: High Comprehension Level: High Ability to understand written instructions: High Ability to understand verbal instructions: High Motivation Anxiety Level: Calm Cooperation: Cooperative Education Importance: Acknowledges Need Interest in Health Problems: Asks Questions Perception: Coherent Willingness to Engage in Self-Management High Activities: Readiness to Engage in Self-Management High Activities: Electronic Signature(s) Signed: 05/26/2020 5:33:51 PM By: Baruch Gouty RN, BSN Entered By: Baruch Gouty on 05/26/2020 15:57:02 -------------------------------------------------------------------------------- Fall Risk Assessment Details Patient Name: Date of Service: BRO WN, [place] 05/26/2020 2:45 PM Medical Record Number: [id number] Patient Account Number: [id number] Date of Birth/Sex: Treating RN: 1940-07-02 (79 y.o. Elam Dutch Primary Care Samarie Pinder:  [name] Other Clinician: Referring Meilyn Heindl: Treating Floyd Lusignan/Extender: Nida Boatman in Treatment: 0 Fall Risk Assessment Items Have you had 2 or more falls in the last 12 monthso 0 No Have you had any fall that resulted in injury in the last 12 monthso 0 No FALLS RISK SCREEN History of falling - immediate or within 3 months 0 No Secondary diagnosis (Do you have 2 or more medical diagnoseso) 0 No Ambulatory aid None/bed rest/wheelchair/nurse 0 No Crutches/cane/walker 15 Yes Furniture 0 No Intravenous therapy Access/Saline/Heparin Lock 0 No Gait/Transferring Normal/ bed rest/ wheelchair 0 No Weak (short steps with or without shuffle, stooped  but able to lift head while walking, may seek 10 Yes support from furniture) Impaired (short steps with shuffle, may have difficulty arising from chair, head down, impaired 0 No balance) Mental Status Oriented to own ability 0 Yes Electronic Signature(s) Signed: 05/26/2020 5:28:36 PM By: Carlene Coria RN Signed: 05/26/2020 5:33:51 PM By: Baruch Gouty RN, BSN Entered By: Carlene Coria on 05/26/2020 16:36:36 -------------------------------------------------------------------------------- Foot Assessment Details Patient Name: Date of Service: ZYA, FINKLE 05/26/2020 2:45 PM Medical Record Number: [id number] Patient Account Number: [id number] Date of Birth/Sex: Treating RN: 07-30-1940 (79 y.o. Elam Dutch Primary Care Ko Bardon: [name] Other Clinician: Referring Thula Stewart: Treating Brittania Sudbeck/Extender: Nida Boatman in Treatment: 0 Foot Assessment Items Site Locations + = Sensation present, - = Sensation absent, C = Callus, U = Ulcer R = Redness, W = Warmth, M = Maceration, PU = Pre-ulcerative lesion F = Fissure, S = Swelling, D = Dryness Assessment Right: Left: Other Deformity: No No Prior Foot Ulcer: No No Prior Amputation: No No Charcot Joint: No No Ambulatory  Status: Ambulatory With Help Assistance Device: Walker Gait: Steady Electronic Signature(s) Signed: 05/26/2020 5:33:51 PM By: Baruch Gouty RN, BSN Entered By: Baruch Gouty on 05/26/2020 15:59:41 -------------------------------------------------------------------------------- Nutrition Risk Screening Details Patient Name: Date of Service: MICAELLA, GITTO 05/26/2020 2:45 PM Medical Record Number: [id number] Patient Account Number: [id number] Date of Birth/Sex: Treating RN: 1940/08/27 (79 y.o. Elam Dutch Primary Care Tedric Leeth: [name] Other Clinician: Referring Aretha Levi: Treating Chrystian Ressler/Extender: Nida Boatman in Treatment: 0 Height (in): 63 Weight (lbs): 207 Body Mass Index (BMI): 36.7 Nutrition Risk Screening Items Score Screening NUTRITION RISK SCREEN: I have an illness or condition that made me change the kind and/or amount of food I eat 0 No I eat fewer than two meals per day 0 No I eat few fruits and vegetables, or milk products 0 No I have three or more drinks of beer, liquor or wine almost every day 0 No I have tooth or mouth problems that make it hard for me to eat 0 No I don't always have enough money to buy the food I need 0 No I eat alone most of the time 0 No I take three or more different prescribed or over-the-counter drugs a day 1 Yes Without wanting to, I have lost or gained 10 pounds in the last six months 0 No I am not always physically able to shop, cook and/or feed myself 0 No Nutrition Protocols Good Risk Protocol 0 No interventions needed Moderate Risk Protocol High Risk Proctocol Risk Level: Good Risk Score: 1 Electronic Signature(s) Signed: 05/26/2020 5:28:36 PM By: Carlene Coria RN Signed: 05/26/2020 5:33:51 PM By: Baruch Gouty RN, BSN Entered By: Carlene Coria on 05/26/2020 16:36:39

## 2020-05-28 NOTE — Progress Notes (Signed)
Ashlee Miles, Ashlee Miles ([id number]) Visit Report for 05/26/2020 Chief Complaint Document Details Patient Name: Date of Service: Ashlee Miles, Ashlee Miles 05/26/2020 2:45 PM Medical Record Number: [id number] Patient Account Number: [id number] Date of Birth/Sex: Treating RN: [birth year] (79 y.o. F) Primary Care Provider: [name] Other Clinician: Referring Provider: Treating Provider/Extender: Nida Boatman in Treatment: 0 Information Obtained from: Patient Chief Complaint 05/26/2020; patient here for review of an area on her right plantar heel Electronic Signature(s) Signed: 05/26/2020 5:06:48 PM By: Linton Ham MD Entered By: Linton Ham on 05/26/2020 16:44:00 -------------------------------------------------------------------------------- HPI Details Patient Name: Date of Service: Ashlee Miles, Ashlee Miles 05/26/2020 2:45 PM Medical Record Number: [id number] Patient Account Number: [id number] Date of Birth/Sex: Treating RN: February 24, 1941 (79 y.o. F) Primary Care Provider: [name] Other Clinician: Referring Provider: Treating Provider/Extender: Nida Boatman in Treatment: 0 History of Present Illness HPI Description: ADMISSION 05/26/2020 This is a 79 year old patient with type 2 diabetes and end-stage renal disease on dialysis. Roughly 6 weeks ago they noted an area on her right plantar heel. She was seen by her podiatrist Dr. Gardiner Barefoot. Noted at the time she had skin necrosis of the right heel which required debridement. Recommended that she wear a heel cushion. She arrives today with her daughter. I do not know that they have been specifically dressing this area but they did buy heel cups. She is a minimal ambulator and this is not an area for standard pressure ulceration on the plantar heel. I could pick up no real history of anything different no different foot wear, increase in activity that could have precipitated this. In any case the area today  is totally epithelialized Past medical history includes end-stage renal disease on dialysis. She apparently had a hematoma in her left thigh graft site but that resolved. She has a history of a pituitary adenoma congestive heart failure, hypertension and protein C deficiency. There is also DVT listed on her problem list. Electronic Signature(s) Signed: 05/26/2020 5:06:48 PM By: Linton Ham MD Entered By: Linton Ham on 05/26/2020 16:47:17 -------------------------------------------------------------------------------- Physical Exam Details Patient Name: Date of Service: Ashlee Miles, Ashlee Miles 05/26/2020 2:45 PM Medical Record Number: [id number] Patient Account Number: [id number] Date of Birth/Sex: Treating RN: 16-Nov-1940 (79 y.o. F) Primary Care Provider: [name] Other Clinician: Referring Provider: Treating Provider/Extender: Nida Boatman in Treatment: 0 Constitutional Sitting or standing Blood Pressure is within target range for patient.. Pulse regular and within target range for patient.Marland Kitchen Respirations regular, non-labored and within target range.. Temperature is normal and within the target range for the patient.Marland Kitchen Appears in no distress. Cardiovascular Popliteal pulses are palpable.. Pedal pulses are are palpable on the right at both the dorsalis pedis and posterior tibia. Neurological Surprisingly normal test of the microfilament and vibration sense. Notes Wound exam; the area in question was on the right plantar heel near the tip. This is fully epithelialized she is somewhat tender in the general area of her heel but no open wound is identified. The tissue looks completely normal. Electronic Signature(s) Signed: 05/26/2020 5:06:48 PM By: Linton Ham MD Entered By: Linton Ham on 05/26/2020 16:49:00 -------------------------------------------------------------------------------- Physician Orders Details Patient Name: Date of Service: Ashlee Miles, Ashlee Miles 05/26/2020 2:45 PM Medical Record Number: [id number] Patient Account Number: [id number] Date of Birth/Sex: Treating RN: 1940/12/10 (79 y.o. Orvan Falconer Primary Care Provider: [name] Other Clinician: Referring Provider: Treating Provider/Extender: Nida Boatman in Treatment: 0 Verbal / Phone Orders: No Diagnosis Coding Discharge From [facility] Discharge  from [place] - monitor area , no pressure to this area to prevent reopening , float heel while in bed Electronic Signature(s) Signed: 05/26/2020 5:06:48 PM By: Linton Ham MD Signed: 05/26/2020 5:28:36 PM By: Carlene Coria RN Entered By: Carlene Coria on 05/26/2020 16:39:44 -------------------------------------------------------------------------------- Problem List Details Patient Name: Date of Service: Ashlee Miles, Ashlee Miles 05/26/2020 2:45 PM Medical Record Number: [id number] Patient Account Number: [id number] Date of Birth/Sex: Treating RN: 04-Nov-1940 (79 y.o. F) Primary Care Provider: [name] Other Clinician: Referring Provider: Treating Provider/Extender: Nida Boatman in Treatment: 0 Active Problems ICD-10 Encounter Code Description Active Date MDM Diagnosis E11.621 Type 2 diabetes mellitus with foot ulcer 05/26/2020 No Yes L97.518 Non-pressure chronic ulcer of other part of right foot with other specified 05/26/2020 No Yes severity Inactive Problems Resolved Problems Electronic Signature(s) Signed: 05/26/2020 5:06:48 PM By: Linton Ham MD Entered By: Linton Ham on 05/26/2020 16:43:03 -------------------------------------------------------------------------------- Progress Note Details Patient Name: Date of Service: Ashlee Miles, Ashlee Miles 05/26/2020 2:45 PM Medical Record Number: [id number] Patient Account Number: [id number] Date of Birth/Sex: Treating RN: 05-Aug-1940 (79 y.o. F) Primary Care Provider: [name] Other Clinician: Referring  Provider: Treating Provider/Extender: Nida Boatman in Treatment: 0 Subjective Chief Complaint Information obtained from Patient 05/26/2020; patient here for review of an area on her right plantar heel History of Present Illness (HPI) ADMISSION 05/26/2020 This is a 79 year old patient with type 2 diabetes and end-stage renal disease on dialysis. Roughly 6 weeks ago they noted an area on her right plantar heel. She was seen by her podiatrist Dr. Gardiner Barefoot. Noted at the time she had skin necrosis of the right heel which required debridement. Recommended that she wear a heel cushion. She arrives today with her daughter. I do not know that they have been specifically dressing this area but they did buy heel cups. She is a minimal ambulator and this is not an area for standard pressure ulceration on the plantar heel. I could pick up no real history of anything different no different foot wear, increase in activity that could have precipitated this. In any case the area today is totally epithelialized Past medical history includes end-stage renal disease on dialysis. She apparently had a hematoma in her left thigh graft site but that resolved. She has a history of a pituitary adenoma congestive heart failure, hypertension and protein C deficiency. There is also DVT listed on her problem list. Patient History Information obtained from Patient. Allergies No Known Allergies Family History Cancer - Mother, Diabetes - Father,Paternal Grandparents,Mother,Siblings, Hypertension - Father,Siblings, Kidney Disease - Siblings,Father, Stroke - Siblings, No family history of Heart Disease, Hereditary Spherocytosis, Lung Disease, Seizures, Thyroid Problems, Tuberculosis. Social History Never smoker, Marital Status - Separated, Alcohol Use - Never, Drug Use - No History, Caffeine Use - Never. Medical History Eyes Patient has history of Cataracts - bil removed Denies history of  Glaucoma, Optic Neuritis Cardiovascular Patient has history of Congestive Heart Failure, Deep Vein Thrombosis, Hypertension Endocrine Patient has history of Type II Diabetes Denies history of Type I Diabetes Genitourinary Patient has history of End Stage Renal Disease - HD Musculoskeletal Patient has history of Osteoarthritis Neurologic Patient has history of Neuropathy Oncologic Denies history of Received Chemotherapy, Received Radiation Psychiatric Denies history of Anorexia/bulimia, Confinement Anxiety Patient is treated with Insulin. Blood sugar is tested. Hospitalization/Surgery History - cholecystectomy. - hysterectomy. - IVC filter placement. - right femoral dialysis catheter. - AV fistula left thigh. - thrombectomy and reviosion of AV gortex graft.  Medical A Surgical History Notes nd Constitutional Symptoms (General Health) obesity Hematologic/Lymphatic protein C deficiency Cardiovascular hyperlipidemia Gastrointestinal GERD Musculoskeletal DJD Oncologic pituitary adenoma Review of Systems (ROS) Eyes Complains or has symptoms of Glasses / Contacts. Denies complaints or symptoms of Dry Eyes, Vision Changes. Ear/Nose/Mouth/Throat Denies complaints or symptoms of Chronic sinus problems or rhinitis. Respiratory Denies complaints or symptoms of Chronic or frequent coughs, Shortness of Breath. Integumentary (Skin) Denies complaints or symptoms of Wounds. Neurologic Denies complaints or symptoms of Numbness/parasthesias. Psychiatric Denies complaints or symptoms of Claustrophobia, Suicidal. Objective Constitutional Sitting or standing Blood Pressure is within target range for patient.. Pulse regular and within target range for patient.Marland Kitchen Respirations regular, non-labored and within target range.. Temperature is normal and within the target range for the patient.Marland Kitchen Appears in no distress. Vitals Time Taken: 3:36 PM, Height: 63 in, Source: Stated, Weight: 207 lbs,  Source: Stated, BMI: 36.7, Temperature: 98.1 F, Pulse: 69 bpm, Respiratory Rate: 18 breaths/min, Blood Pressure: 136/55 mmHg, Capillary Blood Glucose: 173 mg/dl. Cardiovascular Popliteal pulses are palpable.. Pedal pulses are are palpable on the right at both the dorsalis pedis and posterior tibia. Neurological Surprisingly normal test of the microfilament and vibration sense. General Notes: Wound exam; the area in question was on the right plantar heel near the tip. This is fully epithelialized she is somewhat tender in the general area of her heel but no open wound is identified. The tissue looks completely normal. Assessment Active Problems ICD-10 Type 2 diabetes mellitus with foot ulcer Non-pressure chronic ulcer of other part of right foot with other specified severity Plan Discharge From [facility] Services: Discharge from [place] - monitor area , no pressure to this area to prevent reopening , float heel while in bed 1. The patient has no open wound but did have a wound on the right plantar heel 2. I like the idea of the heel foam cups to protect the heels. 3. The patient did not appear to have any evidence of neuropathy or macrovascular disease based on clinical exam. There was no history of any different foot wear, mobility or anything that could have precipitated this. 4. There is no need for her to be followed here. Will remain available if there is further difficulties in this area Electronic Signature(s) Signed: 05/26/2020 5:06:48 PM By: Linton Ham MD Entered By: Linton Ham on 05/26/2020 16:50:31 -------------------------------------------------------------------------------- HxROS Details Patient Name: Date of Service: Ashlee Miles, Ashlee Miles 05/26/2020 2:45 PM Medical Record Number: [id number] Patient Account Number: [id number] Date of Birth/Sex: Treating RN: 02/06/1941 (79 y.o. Elam Dutch Primary Care Provider: [name] Other Clinician: Referring  Provider: Treating Provider/Extender: Nida Boatman in Treatment: 0 Information Obtained From Patient Eyes Complaints and Symptoms: Positive for: Glasses / Contacts Negative for: Dry Eyes; Vision Changes Medical History: Positive for: Cataracts - bil removed Negative for: Glaucoma; Optic Neuritis Ear/Nose/Mouth/Throat Complaints and Symptoms: Negative for: Chronic sinus problems or rhinitis Respiratory Complaints and Symptoms: Negative for: Chronic or frequent coughs; Shortness of Breath Integumentary (Skin) Complaints and Symptoms: Negative for: Wounds Neurologic Complaints and Symptoms: Negative for: Numbness/parasthesias Medical History: Positive for: Neuropathy Psychiatric Complaints and Symptoms: Negative for: Claustrophobia; Suicidal Medical History: Negative for: Anorexia/bulimia; Confinement Anxiety Constitutional Symptoms (General Health) Medical History: Past Medical History Notes: obesity Hematologic/Lymphatic Medical History: Past Medical History Notes: protein C deficiency Cardiovascular Medical History: Positive for: Congestive Heart Failure; Deep Vein Thrombosis; Hypertension Past Medical History Notes: hyperlipidemia Gastrointestinal Medical History: Past Medical History Notes: GERD Endocrine Medical History: Positive for:  Type II Diabetes Negative for: Type I Diabetes Time with diabetes: 30 yrs Treated with: Insulin Blood sugar tested every day: Yes Tested : once Genitourinary Medical History: Positive for: End Stage Renal Disease - HD Immunological Musculoskeletal Medical History: Positive for: Osteoarthritis Past Medical History Notes: DJD Oncologic Medical History: Negative for: Received Chemotherapy; Received Radiation Past Medical History Notes: pituitary adenoma HBO Extended History Items Eyes: Cataracts Immunizations Pneumococcal Vaccine: Received Pneumococcal Vaccination: Yes Implantable  Devices Yes Hospitalization / Surgery History Type of Hospitalization/Surgery cholecystectomy hysterectomy IVC filter placement right femoral dialysis catheter AV fistula left thigh thrombectomy and reviosion of AV gortex graft Family and Social History Cancer: Yes - Mother; Diabetes: Yes - Father,Paternal Grandparents,Mother,Siblings; Heart Disease: No; Hereditary Spherocytosis: No; Hypertension: Yes - Father,Siblings; Kidney Disease: Yes - Siblings,Father; Lung Disease: No; Seizures: No; Stroke: Yes - Siblings; Thyroid Problems: No; Tuberculosis: No; Never smoker; Marital Status - Separated; Alcohol Use: Never; Drug Use: No History; Caffeine Use: Never; Financial Concerns: No; Food, Clothing or Shelter Needs: No; Support System Lacking: No; Transportation Concerns: No Electronic Signature(s) Signed: 05/26/2020 5:06:48 PM By: Linton Ham MD Signed: 05/26/2020 5:33:51 PM By: Baruch Gouty RN, BSN Entered By: Baruch Gouty on 05/26/2020 15:55:09 -------------------------------------------------------------------------------- [place] Details Patient Name: Date of Service: Ashlee Miles, Ashlee Miles 05/26/2020 Medical Record Number: [id number] Patient Account Number: [id number] Date of Birth/Sex: Treating RN: 11-09-1940 (79 y.o. F) Primary Care Provider: [name] Other Clinician: Referring Provider: Treating Provider/Extender: Nida Boatman in Treatment: 0 Diagnosis Coding ICD-10 Codes Code Description [phone] Type 2 diabetes mellitus with foot ulcer L97.518 Non-pressure chronic ulcer of other part of right foot with other specified severity Facility Procedures CPT4 Code: 22482500 Description: 99213 - WOUND CARE VISIT-LEV 3 EST PT Modifier: Quantity: 1 Physician Procedures : CPT4 Code Description Modifier [id number] - WC PHYS LEVEL 2 - NEW PT ICD-10 Diagnosis Description E11.621 Type 2 diabetes mellitus with foot ulcer L97.518 Non-pressure chronic  ulcer of other part of right foot with other specified severity Quantity: 1 Electronic Signature(s) Signed: 05/26/2020 5:28:36 PM By: Carlene Coria RN Signed: 05/28/2020 3:37:20 PM By: Linton Ham MD Previous Signature: 05/26/2020 5:06:48 PM Version By: Linton Ham MD Entered By: Carlene Coria on 05/26/2020 17:17:26

## 2020-06-23 ENCOUNTER — Encounter (HOSPITAL_COMMUNITY): Payer: 59

## 2020-06-25 ENCOUNTER — Encounter (HOSPITAL_BASED_OUTPATIENT_CLINIC_OR_DEPARTMENT_OTHER): Payer: 59 | Admitting: Internal Medicine

## 2020-07-21 ENCOUNTER — Ambulatory Visit: Payer: 59 | Admitting: Podiatry

## 2020-07-23 ENCOUNTER — Other Ambulatory Visit: Payer: Self-pay

## 2020-07-23 ENCOUNTER — Ambulatory Visit (INDEPENDENT_AMBULATORY_CARE_PROVIDER_SITE_OTHER): Payer: 59 | Admitting: Podiatry

## 2020-07-23 ENCOUNTER — Encounter: Payer: Self-pay | Admitting: Podiatry

## 2020-07-23 DIAGNOSIS — M79675 Pain in left toe(s): Secondary | ICD-10-CM | POA: Diagnosis not present

## 2020-07-23 DIAGNOSIS — M79674 Pain in right toe(s): Secondary | ICD-10-CM

## 2020-07-23 DIAGNOSIS — B351 Tinea unguium: Secondary | ICD-10-CM

## 2020-07-24 NOTE — Progress Notes (Signed)
Subjective:   Patient ID: Ashlee Miles, female   DOB: 80 y.o.   MRN: [id number]   HPI Patient presents nail disease bilateral thick and she cannot cut   ROS      Objective:  Physical Exam  Neurovascular status intact with thick yellow brittle nailbeds 1-5 both feet painful      Assessment:  Mycotic nail infection 1-5 both feet painful     Plan:  Debride painful nailbeds 1-5 both feet no iatrogenic bleeding noted

## 2020-07-30 ENCOUNTER — Other Ambulatory Visit: Payer: Self-pay

## 2020-07-30 ENCOUNTER — Ambulatory Visit (HOSPITAL_COMMUNITY)
Admission: RE | Admit: 2020-07-30 | Discharge: 2020-07-30 | Disposition: A | Discharge status: Home / Self Care | Payer: 59 | Source: Ambulatory Visit | Attending: Internal Medicine | Admitting: Internal Medicine

## 2020-07-30 DIAGNOSIS — Z452 Encounter for adjustment and management of vascular access device: Secondary | ICD-10-CM | POA: Diagnosis not present

## 2020-07-30 MED ORDER — HEPARIN SOD (PORK) LOCK FLUSH 100 UNIT/ML IV SOLN
500.0000 [IU] | INTRAVENOUS | Status: AC | PRN
  Administered 2020-07-30: 500 [IU]
  Filled 2020-07-30: qty 5

## 2020-07-30 MED ORDER — SODIUM CHLORIDE 0.9% FLUSH
10.0000 mL | INTRAVENOUS | Status: AC | PRN
  Administered 2020-07-30: 10 mL

## 2020-09-02 ENCOUNTER — Encounter (HOSPITAL_COMMUNITY): Payer: 59

## 2020-09-08 ENCOUNTER — Inpatient Hospital Stay (HOSPITAL_COMMUNITY): Admission: RE | Admit: 2020-09-08 | Payer: 59 | Source: Ambulatory Visit

## 2020-10-13 ENCOUNTER — Non-Acute Institutional Stay (HOSPITAL_COMMUNITY)
Admission: RE | Admit: 2020-10-13 | Discharge: 2020-10-13 | Disposition: A | Discharge status: Home / Self Care | Payer: 59 | Source: Ambulatory Visit | Attending: Internal Medicine | Admitting: Internal Medicine

## 2020-10-13 ENCOUNTER — Other Ambulatory Visit: Payer: Self-pay

## 2020-10-13 DIAGNOSIS — Z452 Encounter for adjustment and management of vascular access device: Secondary | ICD-10-CM | POA: Diagnosis present

## 2020-10-13 MED ORDER — SODIUM CHLORIDE 0.9% FLUSH
10.0000 mL | INTRAVENOUS | Status: AC | PRN
  Administered 2020-10-13: 10 mL

## 2020-10-13 MED ORDER — HEPARIN SOD (PORK) LOCK FLUSH 100 UNIT/ML IV SOLN
500.0000 [IU] | INTRAVENOUS | Status: AC | PRN
  Administered 2020-10-13: 500 [IU]

## 2020-10-13 NOTE — Progress Notes (Signed)
PATIENT CARE CENTER NOTE    Provider: [name], MD   Procedure: Port-a-cath flush   Note: Patient's PAC flushed with Heparin and 0.9% Sodium Chloride. Sterile technique used. Patient tolerated well. Discharge instructions given.Patient alert, oriented and transferred in wheelchair at discharge.

## 2020-10-15 ENCOUNTER — Other Ambulatory Visit: Payer: Self-pay

## 2020-10-15 ENCOUNTER — Other Ambulatory Visit: Payer: 59 | Admitting: Student

## 2020-10-15 DIAGNOSIS — N186 End stage renal disease: Secondary | ICD-10-CM

## 2020-10-15 DIAGNOSIS — Z515 Encounter for palliative care: Secondary | ICD-10-CM

## 2020-10-15 DIAGNOSIS — R52 Pain, unspecified: Secondary | ICD-10-CM

## 2020-10-15 NOTE — Progress Notes (Signed)
[facility] Pall[facility] Care Consult Note Telephone: [phone]  Fax: [phone]    Date of encounter: 10/15/20 PATIENT NAME: Ashlee Miles   [phone] (home)  DOB: [birth year] MRN: [id number] PRIMARY CARE PROVIDER:    [name], MD,  [address] [place] [address] [phone]  REFERRING PROVIDER:   [name], MD [address] [place],  [place] [address] [phone]  RESPONSIBLE PARTY:    Contact Information    Name Relation Home Work Ashlee Miles Daughter [phone]  [phone]   Leggett,Ashlee Miles Daughter [phone]  [phone]   Ashlee Miles   [phone]       I met face to face with patient and family in the home. Palliative Care was asked to follow this patient by consultation request of  Ashlee Cuff, MD to address advance care planning and complex medical decision making. This is a follow up visit.                                   ASSESSMENT AND PLAN / RECOMMENDATIONS:   Advance Care Planning/Goals of Care: Goals include to maximize quality of life and symptom management. Our advance care planning conversation included a discussion about:     The value and importance of advance care planning   Experiences with loved ones who have been seriously Miles or have died   Exploration of personal, cultural or spiritual beliefs that might influence medical decisions   Exploration of goals of care in the event of a sudden injury or illness   Identification and preparation of a healthcare agent   Review and updating or creation of an  advance directive document .  Decision not to resuscitate or to de-escalate disease focused treatments due to poor prognosis.  CODE STATUS: DNR  Plans to continue dialysis; extensive conversation held. Discussion on Palliative vs. Hospice services. Reviewed MOST form.  Symptom Management/Plan:  ESRD-patient missed 2 HD  sessions. She states she had diarrhea. She is returning to HD tomorrow. Diarrhea has improved. Recommend continuing HD as scheduled. Follow up with nephrology as scheduled. Spoke with nurse at [facility]; INR to be drawn tomorrow and results faxed to PCP.  Chronic pain-patient with chronic pain, bilateral knee pain. Continue oxycodone 33m TID, gabapentin 1062mQHS. Follow up with Ashlee Miles as scheduled; appointment this afternoon.   Daughter Ashlee Dyxpresses caregiver fatigue as she has her own health issues. Patient and family are in agreement with patient going to facility for LT placement. Ashlee Dytates that [facility] to help assist with placement, long range planning.   Follow up Palliative Care Visit: Palliative care will continue to follow for complex medical decision making, advance care planning, and clarification of goals. Return in 4 weeks or prn.  I spent 95 minutes providing this consultation. More than 50% of the time in this consultation was spent in counseling and care coordination.   PPS: 50%  HOSPICE ELIGIBILITY/DIAGNOSIS: TBD  Chief Complaint: Palliative Medicine follow up visit; ESRD.  HISTORY OF PRESENT ILLNESS:  Ashlee Miles a [age].o. year old female  with ESRD. Diagnoses also include chronic diastolic heart failure, diabetes, TIA, DVT, hypotension, GERD, anemia of chronic disease.   Patient with increased confusion. Not taking medications daughter has to do medication box, last had dialysis  this past Friday. Patient reports feeling sick, but unable to specify. Has aid coming daily. Back pain, bilateral knee pain. Patient states she had diarrhea earlier this week, so she did not go to dialysis. Dr. Daphene Miles comes out monthly. Checks blood sugar daily 1-2 times a day; blood sugar 117m/dL today. Denies increased shortness of breath, fatigue. Endorses worsening edema to LE.  [facility] [phone] Patient and daughter both upset, expressed frustration during visit with each other. Daughter expresses caregiver fatigue. [facility]  History obtained from review of EMR, discussion with primary team, and interview with family, facility staff/caregiver and/or Ashlee Miles  I reviewed available labs, medications, imaging, studies and related documents from the EMR.  Records reviewed and summarized above.   ROS  General: NAD EYES: denies vision changes ENMT: denies dysphagia Cardiovascular: denies chest pain Pulmonary: denies cough, denies increased SOB Abdomen: endorses good appetite, loose stools, usually continent of bowel GU: denies dysuria MSK: weakness, no falls reported Skin: denies rashes or wounds Neurological: denies pain, denies insomnia Psych: Endorses stable mood Heme/lymph/immuno: denies bruises, abnormal bleeding  Physical Exam: Pulse: 80, resp 20, b/p 150/70 Constitutional: NAD General: frail appearing EYES: anicteric sclera, lids intact, no discharge  ENMT: intact hearing, oral mucous membranes moist, dentition intact CV: S1S2, RRR, 2+LE edema, HD catheter to left upper thigh Pulmonary: LCTA, no increased work of breathing, no cough, room air Abdomen: normo-active BS + 4 quadrants, soft and non tender GU: deferred MSK: no sarcopenia, moves all extremities, ambulatory with rollator Skin: warm and dry, no rashes or wounds on visible skin Neuro:  no generalized weakness, forgetful Psych: non-anxious affect, A & O x 2 Hem/lymph/immuno: no widespread bruising   Thank you for the opportunity to participate in the care of Ashlee Miles  The palliative care team will continue to follow. Please call our office at [phone] we can be of additional assistance.   LEzekiel Slocumb NP   COVID-19 PATIENT SCREENING TOOL Asked and negative response unless otherwise noted:   Have you had  symptoms of covid, tested positive or been in contact with someone with symptoms/positive test in the past 5-10 days? No

## 2020-10-16 ENCOUNTER — Telehealth: Payer: Self-pay

## 2020-10-16 NOTE — Telephone Encounter (Signed)
10/16/20 '@1230PM'$ : Palliative care SW outreached patients daughter, Ashlee Miles, per NP request.   SW discussed daughters concern around patients current care needs and desire for placement. Daughter shared that patient is a dialysis patients however, she is noncompliant at times and goes when she feels like going. Patient is also noncompliant with medication at times as daughter has difficult time with keeping up with when patient has taking her meds. Daughter expresses caregiver fatigue as she has her own health concerns and is unable to continue caring for patient. Daughter shares that patient is aware that family is looking into placement for her, she is receptive to this.  Daughter shares that she has placed patient on the waiting list at [facility], but has not receive an update on where they stand on the list currently. SW discussed other SNF's in [place] as well as possible ALF's as daughter describes that patient is mobile with a RW, independent with transfer and can toilet self. Daughter shared that patients receives  CNA services through [facility] in the home but patient is requiring more assistance with cueing and medication management than the agency can provide.   SW emailed a list of ALF's to daughter ([email or web address]) for her and family to also look into for patient, should she not be able to admit into [facility].   [facility] [id number] [place]; [place], [place] [address] ([place]; [place], [place] [address] ([place]; [place], [place] [address] [phone]  [place]; [place], [place] [address] [phone]  [place]; [place], [place] [address] [phone]  Daughter  had no other concerns for SW at this time. Daughter has SW contact information if needed.

## 2020-10-18 ENCOUNTER — Emergency Department (HOSPITAL_COMMUNITY)
Admission: EM | Admit: 2020-10-18 | Discharge: 2020-10-19 | Disposition: A | Discharge status: Home / Self Care | Payer: 59 | Attending: Emergency Medicine | Admitting: Emergency Medicine

## 2020-10-18 ENCOUNTER — Encounter (HOSPITAL_COMMUNITY): Payer: Self-pay

## 2020-10-18 ENCOUNTER — Other Ambulatory Visit: Payer: Self-pay

## 2020-10-18 DIAGNOSIS — Z794 Long term (current) use of insulin: Secondary | ICD-10-CM | POA: Diagnosis not present

## 2020-10-18 DIAGNOSIS — N186 End stage renal disease: Secondary | ICD-10-CM | POA: Insufficient documentation

## 2020-10-18 DIAGNOSIS — Y848 Other medical procedures as the cause of abnormal reaction of the patient, or of later complication, without mention of misadventure at the time of the procedure: Secondary | ICD-10-CM | POA: Diagnosis not present

## 2020-10-18 DIAGNOSIS — I132 Hypertensive heart and chronic kidney disease with heart failure and with stage 5 chronic kidney disease, or end stage renal disease: Secondary | ICD-10-CM | POA: Diagnosis not present

## 2020-10-18 DIAGNOSIS — R0789 Other chest pain: Secondary | ICD-10-CM | POA: Insufficient documentation

## 2020-10-18 DIAGNOSIS — T82838A Hemorrhage of vascular prosthetic devices, implants and grafts, initial encounter: Secondary | ICD-10-CM | POA: Diagnosis present

## 2020-10-18 DIAGNOSIS — Z992 Dependence on renal dialysis: Secondary | ICD-10-CM | POA: Diagnosis not present

## 2020-10-18 DIAGNOSIS — I5032 Chronic diastolic (congestive) heart failure: Secondary | ICD-10-CM | POA: Insufficient documentation

## 2020-10-18 DIAGNOSIS — Z955 Presence of coronary angioplasty implant and graft: Secondary | ICD-10-CM | POA: Diagnosis not present

## 2020-10-18 DIAGNOSIS — Z7901 Long term (current) use of anticoagulants: Secondary | ICD-10-CM | POA: Diagnosis not present

## 2020-10-18 DIAGNOSIS — E1122 Type 2 diabetes mellitus with diabetic chronic kidney disease: Secondary | ICD-10-CM | POA: Diagnosis not present

## 2020-10-18 DIAGNOSIS — E875 Hyperkalemia: Secondary | ICD-10-CM | POA: Diagnosis not present

## 2020-10-18 NOTE — ED Triage Notes (Signed)
Pt arrives from EMS, EMS report pt has dialysis on Friday, port left leg, granddaughter was changing the bandage and  Leg started spurting out blood. EMS reports applied pressure for a total of 20 mins with bleeding still uncontrolled then applied a tourniquet @ 2316  with pulses lost to left foot after placement of tourniquet. In route pt CC of chest pain. EMS States pt on warfarin   Hx of this happening    110 104  HR 76  CBG 220

## 2020-10-18 NOTE — ED Notes (Addendum)
Dr. Leonette Monarch at bedside at this time. MD removed tourniquet from left thigh. No bleeding noted at this time.

## 2020-10-19 ENCOUNTER — Emergency Department (HOSPITAL_COMMUNITY): Payer: 59

## 2020-10-19 DIAGNOSIS — T82838A Hemorrhage of vascular prosthetic devices, implants and grafts, initial encounter: Secondary | ICD-10-CM | POA: Diagnosis not present

## 2020-10-19 LAB — BASIC METABOLIC PANEL
Anion gap: 12 (ref 5–15)
BUN: 40 mg/dL — ABNORMAL HIGH (ref 8–23)
CO2: 26 mmol/L (ref 22–32)
Calcium: 8 mg/dL — ABNORMAL LOW (ref 8.9–10.3)
Chloride: 102 mmol/L (ref 98–111)
Creatinine, Ser: 9.66 mg/dL — ABNORMAL HIGH (ref 0.44–1.00)
GFR, Estimated: 4 mL/min — ABNORMAL LOW (ref >60)
Glucose, Bld: 219 mg/dL — ABNORMAL HIGH (ref 70–99)
Potassium: 5.9 mmol/L — ABNORMAL HIGH (ref 3.5–5.1)
Sodium: 140 mmol/L (ref 135–145)

## 2020-10-19 LAB — CBC
HCT: 26.5 % — ABNORMAL LOW (ref 36.0–46.0)
Hemoglobin: 8.4 g/dL — ABNORMAL LOW (ref 12.0–15.0)
MCH: 26 pg (ref 26.0–34.0)
MCHC: 31.7 g/dL (ref 30.0–36.0)
MCV: 82 fL (ref 80.0–100.0)
Platelets: 177 10*3/uL (ref 150–400)
RBC: 3.23 MIL/uL — ABNORMAL LOW (ref 3.87–5.11)
RDW: 17.3 % — ABNORMAL HIGH (ref 11.5–15.5)
WBC: 5.8 10*3/uL (ref 4.0–10.5)
nRBC: 0.3 % — ABNORMAL HIGH (ref 0.0–0.2)

## 2020-10-19 LAB — CBG MONITORING, ED: Glucose-Capillary: 194 mg/dL — ABNORMAL HIGH (ref 70–99)

## 2020-10-19 LAB — PROTIME-INR
INR: 3.4 — ABNORMAL HIGH (ref 0.8–1.2)
Prothrombin Time: 34 seconds — ABNORMAL HIGH (ref 11.4–15.2)

## 2020-10-19 LAB — TROPONIN I (HIGH SENSITIVITY)
Troponin I (High Sensitivity): 24 ng/L — ABNORMAL HIGH (ref <18)
Troponin I (High Sensitivity): 25 ng/L — ABNORMAL HIGH (ref <18)

## 2020-10-19 MED ORDER — SODIUM ZIRCONIUM CYCLOSILICATE 5 G PO PACK
5.0000 g | PACK | Freq: Once | ORAL | Status: AC
  Administered 2020-10-19: 5 g via ORAL
  Filled 2020-10-19: qty 1

## 2020-10-19 NOTE — ED Notes (Signed)
PTAR CALLED  °

## 2020-10-19 NOTE — Discharge Instructions (Signed)
Please go to dialysis as scheduled today

## 2020-10-19 NOTE — ED Notes (Signed)
No bleeding noted from left lower extremity fistula.

## 2020-10-19 NOTE — ED Notes (Signed)
No bleeding noted to left lower extremity. Fistula - thrill and bruit present.

## 2020-10-19 NOTE — ED Provider Notes (Signed)
[facility] EMERGENCY DEPARTMENT Provider Note  CSN: [id number] Arrival date & time: 10/18/20 2330  Chief Complaint(s) Vascular Access Problem, Chest Pain, and hemorrhage   HPI Ashlee Miles is a 80 y.o. female with a past medical history listed below including ESRD on dialysis MWF, protein C deficiency on warfarin who presents to the emergency department for bleeding of the AV fistula site in her left upper thigh.  EMS reports that family was changing the dressing from her dialysis session on Friday.  Upon removal the site began to bleed profusely prompting the call to EMS.  EMS had difficulty obtaining hemostasis with pressure and applied a tourniquet over the AV fistula.   In route patient began to have right-sided chest pressure which she describes as "feeling like I was about to go."  Pain was severe and sudden.  Has subsided on its own without intervention.  Pain is worse with movement and palpation of the chest.  Patient reports having similar sensation but not this intense.  She reports that the pain was nonradiating.  No associated shortness of breath or nausea.  No emesis.  HPI  Past Medical History Past Medical History:  Diagnosis Date  . CHF (congestive heart failure) ([place])   . Diabetes mellitus without complication ([place])   . ESRD (end stage renal disease) on dialysis ([place])   . History of pituitary adenoma   . Hypertension   . Protein C deficiency [facility])    Patient Active Problem List   Diagnosis Date Noted  . Skin necrosis ([place]) 04/28/2020  . AMS (altered mental status) 03/22/2020  . Hypoglycemia 03/22/2020  . Dyspnea 02/12/2019  . Memory loss 03/25/2018  . Generalized weakness 10/31/2017  . Palliative care encounter 10/31/2017  . Leg wound, left 07/12/2017  . Cellulitis of left lower extremity 07/12/2017  . HLD (hyperlipidemia) 07/12/2017  . Chest pain in adult 06/22/2017  . Elevated troponin   . Altered mental status   . Diabetes mellitus with end  stage renal disease ([place]) 05/31/2017  . Depression 05/31/2017  . GERD (gastroesophageal reflux disease) 05/31/2017  . Left groin pain 05/31/2017  . Protein C deficiency ([place])   . 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 Falls 04/25/2017  . Hypotension 04/25/2017  . Epigastric pain 04/25/2017  . Constipation 04/25/2017  . ESRD on dialysis ([place]) 04/06/2017  . Anemia due to chronic kidney disease 04/06/2017  . Thrombocytopenia ([place]) 04/06/2017  . Cough in adult 04/06/2017  . DVT (deep venous thrombosis) ([place]) 04/05/2017  . Pressure injury of skin 03/14/2017  . Acute metabolic encephalopathy [id number]  . Skin lesion of hand 03/12/2017  . TIA (transient ischemic attack) 03/01/2017  . Syncope 03/01/2017  . Diabetes mellitus with complication ([place]) [id number]  . Essential hypertension 03/01/2017  . Chronic diastolic CHF (congestive heart failure) ([place]) 03/01/2017   Home Medication(s) Prior to Admission medications   Medication Sig Start Date End Date Taking? Authorizing Provider  ACCU-CHEK AVIVA PLUS test strip 1 each 3 (three) times daily. 04/15/20   [provider]  albuterol (PROVENTIL) (2.5 MG/3ML) 0.083% nebulizer solution Take 3 mLs (2.5 mg total) by nebulization every 4 (four) hours as needed for wheezing or shortness of breath. Diagnosis COPD J44.9 12/20/18   Collene Gobble, MD  albuterol (VENTOLIN HFA) 108 (90 Base) MCG/ACT inhaler Inhale 2 puffs into the lungs every 4 (four) hours as needed for wheezing or shortness of breath.     [provider]  Amino Acids-Protein Hydrolys (FEEDING SUPPLEMENT, PRO-STAT SUGAR FREE  64,) LIQD Take 30 mLs by mouth 2 (two) times daily. 04/12/17   Sheikh, Omair Latif, DO  amLODipine (NORVASC) 5 MG tablet Take 5 mg by mouth at bedtime. Patient not taking: Reported on 10/15/2020 03/11/20   [provider]  BD PEN NEEDLE NANO U/F 32G X 4 MM MISC 3 (three) times daily.  01/18/19   [provider]  calcitRIOL (ROCALTROL) 0.5 MCG  capsule Take 0.5 mcg by mouth daily.    [provider]  carvedilol (COREG) 3.125 MG tablet Take 3.125 mg by mouth 2 (two) times daily with a meal.  Patient not taking: Reported on 10/15/2020 04/16/18   [provider]  cinacalcet (SENSIPAR) 30 MG tablet Take 30 mg by mouth 3 (three) times a week.  02/11/19   [provider]  Darbepoetin Alfa (ARANESP, ALBUMIN FREE,) 40 MCG/ML SOLN Aranesp (in polysorbate) 40 mcg/mL 03/28/20   [provider]  docusate sodium (COLACE) 100 MG capsule Take 1 capsule (100 mg total) by mouth 2 (two) times daily. 04/12/17   Raiford Noble Latif, DO  famotidine (PEPCID) 20 MG tablet Take 1 tablet (20 mg total) daily by mouth. 04/28/17   Lavina Hamman, MD  gabapentin (NEURONTIN) 100 MG capsule Take 100 mg by mouth at bedtime.  01/19/19   [provider]  HUMULIN R 100 UNIT/ML injection  04/20/20   [provider]  HYDROcodone-acetaminophen (NORCO/VICODIN) 5-325 MG tablet Take 1 tablet by mouth 3 (three) times daily as needed. Patient not taking: Reported on 10/15/2020 04/08/20   [provider]  lidocaine-prilocaine (EMLA) cream Apply 1 application topically as directed.  10/10/18   [provider]  midodrine (PROAMATINE) 10 MG tablet Take 1 tablet (10 mg total) by mouth every Monday, Wednesday, and Friday with hemodialysis. 04/14/17   Raiford Noble Latif, DO  multivitamin (RENA-VIT) TABS tablet Take 1 tablet by mouth at bedtime. 04/12/17   Raiford Noble Latif, DO  Oxycodone HCl 10 MG TABS Take 10 mg by mouth 3 (three) times daily as needed (pain).    [provider]  pantoprazole (PROTONIX) 40 MG tablet Take 1 tablet (40 mg total) 2 (two) times daily before a meal by mouth. 04/28/17   Lavina Hamman, MD  polyethylene glycol (MIRALAX / GLYCOLAX) 17 g packet Take 17 g by mouth daily. Patient not taking: Reported on 10/15/2020 03/19/20   Milton Ferguson, MD  sertraline (ZOLOFT) 50 MG tablet Take 1 tablet  (50 mg total) by mouth daily. 03/26/20   Benay Pike, MD  sevelamer carbonate (RENVELA) 800 MG tablet Take 800 mg by mouth 3 (three) times daily with meals.    [provider]  simvastatin (ZOCOR) 40 MG tablet Take 40 mg by mouth daily at 6 PM.    [provider]  sucralfate (CARAFATE) 1 GM/10ML suspension Take 10 mLs (1 g total) 4 (four) times daily -  with meals and at bedtime by mouth. Patient not taking: Reported on 10/15/2020 04/28/17   Lavina Hamman, MD  thiamine (VITAMIN B-1) 100 MG tablet Take 1 tablet (100 mg total) by mouth daily. Patient not taking: Reported on 10/15/2020 06/09/17   Dessa Phi, DO  warfarin (COUMADIN) 5 MG tablet Take 5 mg by mouth daily.     [provider]  ZTLIDO 1.8 % PTCH Place 1 patch onto the skin every other day. 02/28/20   [provider]  Past Surgical History Past Surgical History:  Procedure Laterality Date  . AV FISTULA PLACEMENT Left 03/16/2017   Procedure: INSERTION OF ARTERIOVENOUS (AV) GORE-TEX GRAFT THIGH-LEFT;  Surgeon: Angelia Mould, MD;  Location: [facility];  Service: Vascular;  Laterality: Left;  . CHOLECYSTECTOMY    . DIALYSIS FISTULA CREATION     clotted off  . FALSE ANEURYSM REPAIR Left 05/10/2017   Procedure: REPAIR FALSE ANEURYSM left thigh AVGG using Gore 39mx5cm Viabahn Endoprosthesis;  Surgeon: BSerafina Mitchell MD;  [facility];  Service: Vascular;  Laterality: Left;  . I & D EXTREMITY Left 07/14/2017   Procedure: IRRIGATION AND DEBRIDEMENT DISTAL THIGH ABSCESS;  Surgeon: DAngelia Mould MD;  [facility]  Service: Vascular;  Laterality: Left;  . INSERTION OF DIALYSIS CATHETER Right 03/16/2017   Procedure: INSERTION OF RIGHT FEMORAL TUNNELED DIALYSIS CATHETER;  Surgeon: DAngelia Mould MD;  [facility]  Service: Vascular;  Laterality:  Right;  . IR AV DIALY SHUNT INTRO [place]/PT[place]/IMG LEFT  11/16/2017  . IR DIALY SHUNT INTRO NEEDLE/INTRACATH INITIAL W/IMG LEFT Left 01/10/2019  . IR THROMBECTOMY AV FISTULA W/THROMBOLYSIS/PTA INC/SHUNT/IMG LEFT Left 06/22/2017  . IR THROMBECTOMY AV FISTULA W/THROMBOLYSIS/PTA/STENT INC/SHUNT/IMG LT Left 03/30/2018  . IR [place]ASC ACCESS LEFT  06/22/2017  . IR [place] VASC ACCESS LEFT  03/30/2018  . IVC FILTER INSERTION    . PORTACATH PLACEMENT    . THROMBECTOMY AND REVISION OF ARTERIOVENTOUS (AV) GORETEX  GRAFT Left 06/02/2017   Procedure: EVACUATION OF LEFT THIGH HEMATOMA  AND APPLICATION OF WOUND VAC;  Surgeon: BSerafina Mitchell MD;  [facility];  Service: Vascular;  Laterality: Left;   Family History Family History  Problem Relation Age of Onset  . Breast cancer Mother   . Diabetes Sister   . Diabetes Brother   . CAD Other   . Stroke Neg Hx     Social History Social History   Tobacco Use  . Smoking status: Never Smoker  . Smokeless tobacco: Never Used  Vaping Use  . Vaping Use: Never used  Substance Use Topics  . Alcohol use: No  . Drug use: No   Allergies Patient has no known allergies.  Review of Systems Review of Systems All other systems are reviewed and are negative for acute change except as noted in the HPI  Physical Exam Vital Signs  I have reviewed the triage vital signs BP (!) 133/50   Pulse (!) 57   Temp 98.1 F (36.7 C) (Oral)   Resp 13   Ht '5\' 4"'$  (1.626 m)   Wt 95.3 kg   SpO2 99%   BMI 36.05 kg/m   Physical Exam Vitals reviewed.  Constitutional:      General: She is not in acute distress.    Appearance: She is well-developed. She is not diaphoretic.  HENT:     Head: Normocephalic and atraumatic.     Nose: Nose normal.  Eyes:     General: No scleral icterus.       Right eye: No discharge.        Left eye: No discharge.     Conjunctiva/sclera: Conjunctivae normal.     Pupils: Pupils are equal, round, and reactive  to light.  Cardiovascular:     Rate and Rhythm: Normal rate and regular rhythm.     Chest Wall: Thrill present.     Heart sounds: No murmur heard. No friction rub. No gallop.      Arteriovenous  access: right and left arteriovenous access is present.    Comments: Left thigh AVF with tourniquet over it. Noted to be hemostatic after removal of tourniquet.  Good thrill. Pulmonary:     Effort: Pulmonary effort is normal. No respiratory distress.     Breath sounds: Normal breath sounds. No stridor. No rales.  Chest:     Chest wall: Tenderness present.    Abdominal:     General: There is no distension.     Palpations: Abdomen is soft.     Tenderness: There is no abdominal tenderness.  Musculoskeletal:        General: No tenderness.     Cervical back: Normal range of motion and neck supple.     Right lower leg: 1+ Pitting Edema present.     Left lower leg: 1+ Pitting Edema present.  Skin:    General: Skin is warm and dry.     Findings: No erythema or rash.  Neurological:     Mental Status: She is alert and oriented to person, place, and time.     ED Results and Treatments Labs (all labs ordered are listed, but only abnormal results are displayed) Labs Reviewed  BASIC METABOLIC PANEL - Abnormal; Notable for the following components:      Result Value   Potassium 5.9 (*)    Glucose, Bld 219 (*)    BUN 40 (*)    Creatinine, Ser 9.66 (*)    Calcium 8.0 (*)    GFR, Estimated 4 (*)    All other components within normal limits  CBC - Abnormal; Notable for the following components:   RBC 3.23 (*)    Hemoglobin 8.4 (*)    HCT 26.5 (*)    RDW 17.3 (*)    nRBC 0.3 (*)    All other components within normal limits  PROTIME-INR - Abnormal; Notable for the following components:   Prothrombin Time 34.0 (*)    INR 3.4 (*)    All other components within normal limits  TROPONIN I (HIGH SENSITIVITY) - Abnormal; Notable for the following components:   Troponin I (High Sensitivity) 24 (*)     All other components within normal limits  TROPONIN I (HIGH SENSITIVITY) - Abnormal; Notable for the following components:   Troponin I (High Sensitivity) 25 (*)    All other components within normal limits  CBG MONITORING, ED                                                                                                                         EKG  EKG Interpretation  Date/Time:  Sunday Oct 18 2020 23:42:44 EDT Ventricular Rate:  70 PR Interval:  190 QRS Duration: 82 QT Interval:  395 QTC Calculation: 427 R Axis:   17 Text Interpretation: Sinus rhythm Low voltage, precordial leads No acute changes Confirmed by Addison Lank [phone]) on 10/19/2020 12:44:46 AM      Radiology DG Chest 2 View  Result Date: 10/19/2020 CLINICAL  DATA:  Chest pain. EXAM: CHEST - 2 VIEW COMPARISON:  March 22, 2020 FINDINGS: There is stable right-sided venous Port-A-Cath positioning. There is no evidence of acute infiltrate, pleural effusion or pneumothorax. The cardiac silhouette is enlarged and unchanged in size. Moderate severity calcification of the aortic arch is noted. The visualized skeletal structures are unremarkable. IMPRESSION: Stable cardiomegaly without active cardiopulmonary disease. Electronically Signed   By: Virgina Norfolk M.D.   On: 10/19/2020 02:03    Pertinent labs & imaging results that were available during my care of the patient were reviewed by me and considered in my medical decision making (see chart for details).  Medications Ordered in ED Medications  sodium zirconium cyclosilicate (LOKELMA) packet 5 g (5 g Oral Given 10/19/20 0310)                                                                                                                                    Procedures .1-3 Lead EKG Interpretation Performed by: Fatima Blank, MD Authorized by: Fatima Blank, MD     Interpretation: normal     ECG rate:  79   ECG rate assessment: normal     Rhythm:  sinus rhythm     Ectopy: none     Conduction: normal   Ultrasound ED Peripheral IV (Provider)  Date/Time: 10/19/2020 12:45 AM Performed by: Fatima Blank, MD Authorized by: Fatima Blank, MD   Procedure details:    Indications: multiple failed IV attempts     Skin Prep: chlorhexidine gluconate     Location:  Left AC   Angiocath:  20 G   Bedside Ultrasound Guided: Yes     Images: archived     Patient tolerated procedure without complications: Yes     Dressing applied: Yes      (including critical care time)  Medical Decision Making / ED Course I have reviewed the nursing notes for this encounter and the patient's prior records (if available in EHR or on provided paperwork).   Sandi Mieles was evaluated in Emergency Department on 10/19/2020 for the symptoms described in the history of present illness. She was evaluated in the context of the global COVID-19 pandemic, which necessitated consideration that the patient might be at risk for infection with the SARS-CoV-2 virus that causes COVID-19. Institutional protocols and algorithms that pertain to the evaluation of patients at risk for COVID-19 are in a state of rapid change based on information released by regulatory bodies including the CDC and federal and state organizations. These policies and algorithms were followed during the patient's care in the ED.  Bleeding AV fistula site. Controlled after tourniquet placed by EMS. Labs with stable hemoglobin  In route patient completed chest pain. Highly atypical and reproducible with palpation to the right side of the chest. EKG without acute ischemic changes. Serial troponins flat x2. Doubt ACS. Presentation not classic for aortic dissection or esophageal perforation.  Patient's INR is therapeutic and have low suspicion for PE.  Chest x-ray without evidence suggestive of pneumonia, pneumothorax, pneumomediastinum.  No abnormal contour of the mediastinum to suggest  dissection. No evidence of acute injuries.  Labs notable for mild hypokalemia.  No EKG changes. Patient given Lokelma.  Scheduled for dialysis later on today.  Pain improved. Family and patient updated.     Final Clinical Impression(s) / ED Diagnoses Final diagnoses:  Hemorrhage of hemodialysis arteriovenous fistula of left thigh (HCC)  Atypical chest pain  Chest wall pain  Hyperkalemia    The patient appears reasonably screened and/or stabilized for discharge and I doubt any other medical condition or other [facility] requiring further screening, evaluation, or treatment in the ED at this time prior to discharge. Safe for discharge with strict return precautions.  Disposition: Discharge  Condition: Good  I have discussed the results, Dx and Tx plan with the patient/family who expressed understanding and agree(s) with the plan. Discharge instructions discussed at length. The patient/family was given strict return precautions who verbalized understanding of the instructions. No further questions at time of discharge.    ED Discharge Orders    None       Follow Up: [name], MD [address] [place] [address] [phone]  Call  to schedule an appointment for close follow up     This chart was dictated using voice recognition software.  Despite best efforts to proofread,  errors can occur which can change the documentation meaning.   Fatima Blank, MD 10/19/20 [phone]

## 2020-10-20 ENCOUNTER — Ambulatory Visit: Payer: 59 | Admitting: Podiatry

## 2020-10-22 ENCOUNTER — Inpatient Hospital Stay (HOSPITAL_COMMUNITY)
Admission: EM | Admit: 2020-10-22 | Discharge: 2020-11-05 | DRG: 270 | Disposition: A | Discharge status: Skilled Nursing Facility | Payer: 59 | Attending: Infectious Diseases | Admitting: Infectious Diseases

## 2020-10-22 ENCOUNTER — Encounter (HOSPITAL_COMMUNITY): Payer: Self-pay | Admitting: Anesthesiology

## 2020-10-22 ENCOUNTER — Other Ambulatory Visit: Payer: Self-pay

## 2020-10-22 DIAGNOSIS — R1013 Epigastric pain: Secondary | ICD-10-CM | POA: Diagnosis present

## 2020-10-22 DIAGNOSIS — Z20822 Contact with and (suspected) exposure to covid-19: Secondary | ICD-10-CM | POA: Diagnosis present

## 2020-10-22 DIAGNOSIS — K219 Gastro-esophageal reflux disease without esophagitis: Secondary | ICD-10-CM | POA: Diagnosis present

## 2020-10-22 DIAGNOSIS — T829XXA Unspecified complication of cardiac and vascular prosthetic device, implant and graft, initial encounter: Secondary | ICD-10-CM | POA: Diagnosis not present

## 2020-10-22 DIAGNOSIS — Z9115 Patient's noncompliance with renal dialysis: Secondary | ICD-10-CM

## 2020-10-22 DIAGNOSIS — Z86718 Personal history of other venous thrombosis and embolism: Secondary | ICD-10-CM

## 2020-10-22 DIAGNOSIS — I5032 Chronic diastolic (congestive) heart failure: Secondary | ICD-10-CM | POA: Diagnosis present

## 2020-10-22 DIAGNOSIS — R072 Precordial pain: Secondary | ICD-10-CM | POA: Diagnosis not present

## 2020-10-22 DIAGNOSIS — Z8673 Personal history of transient ischemic attack (TIA), and cerebral infarction without residual deficits: Secondary | ICD-10-CM

## 2020-10-22 DIAGNOSIS — D631 Anemia in chronic kidney disease: Secondary | ICD-10-CM | POA: Diagnosis present

## 2020-10-22 DIAGNOSIS — Z86711 Personal history of pulmonary embolism: Secondary | ICD-10-CM

## 2020-10-22 DIAGNOSIS — R109 Unspecified abdominal pain: Secondary | ICD-10-CM

## 2020-10-22 DIAGNOSIS — Z7901 Long term (current) use of anticoagulants: Secondary | ICD-10-CM

## 2020-10-22 DIAGNOSIS — Z9049 Acquired absence of other specified parts of digestive tract: Secondary | ICD-10-CM

## 2020-10-22 DIAGNOSIS — Z66 Do not resuscitate: Secondary | ICD-10-CM | POA: Diagnosis present

## 2020-10-22 DIAGNOSIS — E1122 Type 2 diabetes mellitus with diabetic chronic kidney disease: Secondary | ICD-10-CM | POA: Diagnosis present

## 2020-10-22 DIAGNOSIS — Z794 Long term (current) use of insulin: Secondary | ICD-10-CM

## 2020-10-22 DIAGNOSIS — N186 End stage renal disease: Secondary | ICD-10-CM | POA: Diagnosis not present

## 2020-10-22 DIAGNOSIS — N189 Chronic kidney disease, unspecified: Secondary | ICD-10-CM | POA: Diagnosis present

## 2020-10-22 DIAGNOSIS — R059 Cough, unspecified: Secondary | ICD-10-CM | POA: Diagnosis not present

## 2020-10-22 DIAGNOSIS — Z8249 Family history of ischemic heart disease and other diseases of the circulatory system: Secondary | ICD-10-CM

## 2020-10-22 DIAGNOSIS — Z8679 Personal history of other diseases of the circulatory system: Secondary | ICD-10-CM | POA: Insufficient documentation

## 2020-10-22 DIAGNOSIS — Z79899 Other long term (current) drug therapy: Secondary | ICD-10-CM

## 2020-10-22 DIAGNOSIS — Y832 Surgical operation with anastomosis, bypass or graft as the cause of abnormal reaction of the patient, or of later complication, without mention of misadventure at the time of the procedure: Secondary | ICD-10-CM | POA: Diagnosis present

## 2020-10-22 DIAGNOSIS — D6859 Other primary thrombophilia: Secondary | ICD-10-CM | POA: Diagnosis present

## 2020-10-22 DIAGNOSIS — Z992 Dependence on renal dialysis: Secondary | ICD-10-CM | POA: Diagnosis not present

## 2020-10-22 DIAGNOSIS — Z803 Family history of malignant neoplasm of breast: Secondary | ICD-10-CM

## 2020-10-22 DIAGNOSIS — E8889 Other specified metabolic disorders: Secondary | ICD-10-CM | POA: Diagnosis present

## 2020-10-22 DIAGNOSIS — E875 Hyperkalemia: Secondary | ICD-10-CM | POA: Diagnosis present

## 2020-10-22 DIAGNOSIS — Z751 Person awaiting admission to adequate facility elsewhere: Secondary | ICD-10-CM

## 2020-10-22 DIAGNOSIS — I953 Hypotension of hemodialysis: Secondary | ICD-10-CM | POA: Diagnosis not present

## 2020-10-22 DIAGNOSIS — I509 Heart failure, unspecified: Secondary | ICD-10-CM | POA: Diagnosis not present

## 2020-10-22 DIAGNOSIS — F32A Depression, unspecified: Secondary | ICD-10-CM | POA: Diagnosis present

## 2020-10-22 DIAGNOSIS — T82898A Other specified complication of vascular prosthetic devices, implants and grafts, initial encounter: Secondary | ICD-10-CM | POA: Diagnosis not present

## 2020-10-22 DIAGNOSIS — I10 Essential (primary) hypertension: Secondary | ICD-10-CM | POA: Diagnosis present

## 2020-10-22 DIAGNOSIS — Z833 Family history of diabetes mellitus: Secondary | ICD-10-CM

## 2020-10-22 DIAGNOSIS — I132 Hypertensive heart and chronic kidney disease with heart failure and with stage 5 chronic kidney disease, or end stage renal disease: Secondary | ICD-10-CM | POA: Diagnosis present

## 2020-10-22 DIAGNOSIS — G894 Chronic pain syndrome: Secondary | ICD-10-CM | POA: Diagnosis present

## 2020-10-22 DIAGNOSIS — D62 Acute posthemorrhagic anemia: Secondary | ICD-10-CM | POA: Diagnosis present

## 2020-10-22 DIAGNOSIS — F1123 Opioid dependence with withdrawal: Secondary | ICD-10-CM | POA: Diagnosis not present

## 2020-10-22 DIAGNOSIS — T82838A Hemorrhage of vascular prosthetic devices, implants and grafts, initial encounter: Secondary | ICD-10-CM | POA: Diagnosis not present

## 2020-10-22 LAB — BASIC METABOLIC PANEL
Anion gap: 12 (ref 5–15)
BUN: 27 mg/dL — ABNORMAL HIGH (ref 8–23)
CO2: 26 mmol/L (ref 22–32)
Calcium: 8.6 mg/dL — ABNORMAL LOW (ref 8.9–10.3)
Chloride: 102 mmol/L (ref 98–111)
Creatinine, Ser: 7.38 mg/dL — ABNORMAL HIGH (ref 0.44–1.00)
GFR, Estimated: 5 mL/min — ABNORMAL LOW (ref >60)
Glucose, Bld: 109 mg/dL — ABNORMAL HIGH (ref 70–99)
Potassium: 5.5 mmol/L — ABNORMAL HIGH (ref 3.5–5.1)
Sodium: 140 mmol/L (ref 135–145)

## 2020-10-22 LAB — CBC WITH DIFFERENTIAL/PLATELET
Abs Immature Granulocytes: 0.04 10*3/uL (ref 0.00–0.07)
Basophils Absolute: 0 10*3/uL (ref 0.0–0.1)
Basophils Relative: 0 %
Eosinophils Absolute: 0.3 10*3/uL (ref 0.0–0.5)
Eosinophils Relative: 4 %
HCT: 25.1 % — ABNORMAL LOW (ref 36.0–46.0)
Hemoglobin: 7.8 g/dL — ABNORMAL LOW (ref 12.0–15.0)
Immature Granulocytes: 1 %
Lymphocytes Relative: 22 %
Lymphs Abs: 1.5 10*3/uL (ref 0.7–4.0)
MCH: 26.3 pg (ref 26.0–34.0)
MCHC: 31.1 g/dL (ref 30.0–36.0)
MCV: 84.5 fL (ref 80.0–100.0)
Monocytes Absolute: 0.6 10*3/uL (ref 0.1–1.0)
Monocytes Relative: 10 %
Neutro Abs: 4.3 10*3/uL (ref 1.7–7.7)
Neutrophils Relative %: 63 %
Platelets: 216 10*3/uL (ref 150–400)
RBC: 2.97 MIL/uL — ABNORMAL LOW (ref 3.87–5.11)
RDW: 18.2 % — ABNORMAL HIGH (ref 11.5–15.5)
WBC: 6.8 10*3/uL (ref 4.0–10.5)
nRBC: 0 % (ref 0.0–0.2)

## 2020-10-22 LAB — PROTIME-INR
INR: 5.2 (ref 0.8–1.2)
Prothrombin Time: 48.1 seconds — ABNORMAL HIGH (ref 11.4–15.2)

## 2020-10-22 MED ORDER — SODIUM CHLORIDE 0.9% FLUSH
10.0000 mL | Freq: Two times a day (BID) | INTRAVENOUS | Status: DC
  Administered 2020-10-23 – 2020-10-25 (×4): 10 mL
  Administered 2020-10-25: 20 mL
  Administered 2020-10-26: 10 mL
  Administered 2020-10-27: 20 mL
  Administered 2020-10-28 – 2020-11-05 (×11): 10 mL

## 2020-10-22 MED ORDER — SODIUM CHLORIDE 0.9% FLUSH: 10.0000 mL | INTRAVENOUS | Status: DC | PRN

## 2020-10-22 MED ORDER — CHLORHEXIDINE GLUCONATE CLOTH 2 % EX PADS
6.0000 | MEDICATED_PAD | Freq: Every day | CUTANEOUS | Status: DC
  Administered 2020-10-23 – 2020-11-04 (×13): 6 via TOPICAL

## 2020-10-22 MED ORDER — ACETAMINOPHEN 650 MG RE SUPP: 650.0000 mg | Freq: Four times a day (QID) | RECTAL | Status: DC | PRN

## 2020-10-22 MED ORDER — ACETAMINOPHEN 325 MG PO TABS
650.0000 mg | ORAL_TABLET | Freq: Four times a day (QID) | ORAL | Status: DC | PRN
  Administered 2020-10-23 – 2020-10-27 (×3): 650 mg via ORAL
  Filled 2020-10-22 (×3): qty 2

## 2020-10-22 MED ORDER — CHLORHEXIDINE GLUCONATE CLOTH 2 % EX PADS
6.0000 | MEDICATED_PAD | Freq: Every day | CUTANEOUS | Status: DC
  Administered 2020-10-23 – 2020-11-05 (×13): 6 via TOPICAL

## 2020-10-22 MED ORDER — VITAMIN K1 10 MG/ML IJ SOLN
1.0000 mg | Freq: Once | INTRAVENOUS | Status: AC
  Administered 2020-10-23: 1 mg via INTRAVENOUS
  Filled 2020-10-22: qty 0.1

## 2020-10-22 MED ORDER — VITAMIN K1 10 MG/ML IJ SOLN: 2.5000 mg | Freq: Once | INTRAVENOUS | Status: DC

## 2020-10-22 MED ORDER — SODIUM CHLORIDE 0.9% FLUSH
3.0000 mL | Freq: Two times a day (BID) | INTRAVENOUS | Status: DC
  Administered 2020-10-23 – 2020-11-01 (×5): 3 mL via INTRAVENOUS

## 2020-10-22 NOTE — Progress Notes (Signed)
Called about this patient sent from HD due to suspicious lesion on there L thigh AVG. They did not feel safe dialyzing her and sent her to ED.  On exam there is an eschar at the most distal end of the L thigh avg about 0.5 cm x 0.5 cm. Recommend asking [facility] to evaluate the access. Will follow.   [name], MD 10/22/2020, 3:33 PM

## 2020-10-22 NOTE — H&P (Addendum)
Date: 10/22/2020               Patient Name:  Ashlee Miles MRN: [id number]  DOB: 1940-10-21 Age / Sex: 80 y.o., female   PCP: [name], MD         Medical Service: Internal Medicine Teaching Service         Attending Physician: Dr. Sid Falcon, MD    First Contact: Dr. Collene Gobble Pager: [phone]  Second Contact: Dr. Marva Panda Pager: [phone]       After Hours (After 5p/  First Contact Pager: [phone]  weekends / holidays): Second Contact Pager: [phone]   Chief Complaint: fistula issue  History of Present Illness:  Ashlee Miles is 80yo person living with ESRD on MWF HD, HFpEF, T2DM, protein C deficiency, hx DVT/PE on Coumadin presenting for evaluation of fistula. She reports profuse bleeding from her left thigh fistula earlier this week after dressing was changed. Per chart review, she was evaluated in the ED on 5/1 for this issue. Her INR at the time was found to be supratherapeutic and she was discharged. She further reports attending full dialysis session yesterday without any issues. She had planned on getting her Friday session today so she could attend the local strawberry festival. However, HD was unable to be completed due to the eschar located on the fistula and she was sent to the ED. She does mention missing a few HD sessions, last being earlier last week. Otherwise reports she hasn't eaten since yesterday. Denies fevers, chills, chest pain, dyspnea, cough, nausea, vomiting, abdominal pain, constipation, dysuria. She does report an episode of diarrhea last week.  Of note, patient has seen palliative services in the outpatient setting. She mentions she is okay with revision of current graft, but would not want another graft placed if the current one is deemed unusable.   Meds:  No outpatient medications have been marked as taking for the 10/22/20 encounter [facility] Encounter).   Allergies: Allergies as of 10/22/2020  . (No Known Allergies)   Past Medical History:   Diagnosis Date  . CHF (congestive heart failure) ([place])   . Diabetes mellitus without complication ([place])   . ESRD (end stage renal disease) on dialysis ([place])   . History of pituitary adenoma   . Hypertension   . Protein C deficiency ([place])    Family History:  Family History  Problem Relation Age of Onset  . Breast cancer Mother   . Diabetes Sister   . Diabetes Brother   . CAD Other   . Stroke Neg Hx    Social History:  Patient lives with her daughter's family (including her husband and 2 grandchildren) in [place]. She has aid to help her with ADL's. No history of tobacco use, alcohol use or illicit drug use.   Review of Systems: A complete ROS was negative except as per HPI.   Physical Exam: Blood pressure 134/61, pulse 75, temperature 98.7 F (37.1 C), resp. rate 15, SpO2 99 %. General: Laying in bed comfortably, no acute distress HENT: Normocephalic, atraumatic. Vision grossly in tact. CV: Regular rate, rhythm. 2/6 systolic murmur appreciated at LUSB Pulm: Normal work of breathing, no use accessory muscles. Clear to auscultation bilaterally from anterior fields. GI: Soft, non-tender, non-distended. Normoactive bowel sounds. Skin: Warm, dry. L thigh graft with palpable thrill. Small eschar appreciated, no active drainage or bleeding present. No erythema appreciated. MSK: Bilateral lower extremity non-pitting edema. Normal bulk, tone. Neuro: Awake, alert, oriented x4. Answering questions and  moving extremities appropriately. Psych: Normal mood, speech.  CBC Latest Ref Rng & Units 10/22/2020 10/18/2020 03/26/2020  WBC 4.0 - 10.5 K/uL 6.8 5.8 6.0  Hemoglobin 12.0 - 15.0 g/dL 7.8(L) 8.4(L) 9.1(L)  Hematocrit 36.0 - 46.0 % 25.1(L) 26.5(L) 27.7(L)  Platelets 150 - 400 K/uL 216 177 290   BMP Latest Ref Rng & Units 10/22/2020 10/18/2020 03/26/2020  Glucose 70 - 99 mg/dL 109(H) 219(H) 98  BUN 8 - 23 mg/dL 27(H) 40(H) 13  Creatinine 0.44 - 1.00 mg/dL 7.38(H) 9.66(H) 4.99(H)  Sodium 135  - 145 mmol/L 140 140 135  Potassium 3.5 - 5.1 mmol/L 5.5(H) 5.9(H) 3.9  Chloride 98 - 111 mmol/L 102 102 96(L)  CO2 22 - 32 mmol/L '26 26 30  '$ Calcium 8.9 - 10.3 mg/dL 8.6(L) 8.0(L) 8.4(L)   EKG: personally reviewed my interpretation is normal sinus rhythm, prolonged PR interval similar to previous  Assessment & Plan by Problem: Ashlee Miles is 80yo person living with ESRD on MWF HD, HFpEF, T2DM, protein C deficiency, hx DVT/PE on Coumadin admitted 5/5 with ulceration over left thigh AV graft with plans for revision with vascular surgery.  Active Problems:   S/P arteriovenous (AV) fistula repair  #AV graft eschar Patient presenting to ED with eschar present on AV graft on L thigh. Patient previously found to be supratherapeutic with INR 3.4 three days ago after bleeding from site.  In ED, patient afebrile and hemodynamically stable, overall having no symptoms right now. Vascular surgery and nephrology consulted in ED, appreciate recommendations. Per vascular surgery will check INR with plans for revision tentatively for tomorrow. If INR is not therapeutic will have to wait until Monday. - AV graft revision tomorrow - F/u INR - Hold anticoagulation - NPO @ [place]  #ESRD on MWF HD #Hyperkalemia She goes to HD at [facility], last session yesterday 5/4. K 5.5 on arrival. Per vascular surgery prefer to proceed with dialysis prior to surgery tomorrow. Will discuss with nephrology with plans for HD tonight.  - Appreciate nephrology recs - HD tonight per nephrology - F/u BMP in AM  #HFpEF Last Echo 06/2017 w/ EF [id number], grade I diastolic dysfunction. Patient hemodynamically stable on arrival. She has some non-pitting edema in bilateral lower extremities but appears to be non-cardiac related. She does have murmur but likely related to AV fistula.  - Hold home amlodipine, carvedilol  #Type 2 DM Last A1c 6.2% in April of this year. Patient reports compliance with medications. - CBG  monitoring - SSI  #Protein C deficiency #Hx DVT/PE on [facility] Patient has history of DVT on Coumadin. Last INR 3.4 a few days ago. No signs of DVT or PE on exam. Will hold anticoagulation at this time with plans for surgery tomorrow.  - Hold anticoagulation  DIET: CM IVF: n/a DVT PPX: SCD BOWEL: n/a CODE: DNR/DNI FAM COM: n/a  Dispo: Admit patient to Observation with expected length of stay less than 2 midnights.  Signed: [name], MD 10/22/2020, 4:59 PM  Pager: [phone] After 5pm on weekdays and 1pm on weekends: On Call pager: [phone]

## 2020-10-22 NOTE — ED Notes (Signed)
Report attempted RN told will call back for report.

## 2020-10-22 NOTE — ED Triage Notes (Signed)
Pt from dialysis, normally MWF schedule but went today because of vacation planned for tomorrow. Pt has no complaints but did not receive dialysis d/t staff stating that they did not want to access fistula in LLE d/t its appearance of looking like it might burst.

## 2020-10-22 NOTE — ED Provider Notes (Signed)
[facility] EMERGENCY DEPARTMENT Provider Note   CSN: [id number] Arrival date & time: 10/22/20  1240     History Chief Complaint  Patient presents with  . Vascular Access Problem    Ashlee Miles is a 80 y.o. female.  HPI  Patient presents with fistula concern; she is unclear on the details of the history so I called her daughter [name] at her request, confirmed identifiers, obtained history. [name] tells me the history on the chart of recent hemorrhage which was controlled in the ED. Today, daughter requested dialysis early and they agreed to try today. They were unable to access the port, said it could not be used, and recommended she go back home. Daughter requested EMS transport to ED. Pt has been seeing palliative care and recently made the decision that if her access stopped working, she did not want to pursue any further access and understood she would pass away from this. The patient at this time, per the daughter, does not recall this, and the daughter asked again today and the patient said she did not want to die. The family discussed and are not sure that the patient is decisional.      Past Medical History:  Diagnosis Date  . CHF (congestive heart failure) ([place])   . Diabetes mellitus without complication ([place])   . ESRD (end stage renal disease) on dialysis ([place])   . History of pituitary adenoma   . Hypertension   . Protein C deficiency [facility])     Patient Active Problem List   Diagnosis Date Noted  . Skin necrosis ([place]) 04/28/2020  . AMS (altered mental status) 03/22/2020  . Hypoglycemia 03/22/2020  . Dyspnea 02/12/2019  . Memory loss 03/25/2018  . Generalized weakness 10/31/2017  . Palliative care encounter 10/31/2017  . Leg wound, left 07/12/2017  . Cellulitis of left lower extremity 07/12/2017  . HLD (hyperlipidemia) 07/12/2017  . Chest pain in adult 06/22/2017  . Elevated troponin   . Altered mental status   . Diabetes mellitus with end  stage renal disease ([place]) 05/31/2017  . Depression 05/31/2017  . GERD (gastroesophageal reflux disease) 05/31/2017  . Left groin pain 05/31/2017  . Protein C deficiency ([place])   . 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 Falls 04/25/2017  . Hypotension 04/25/2017  . Epigastric pain 04/25/2017  . Constipation 04/25/2017  . ESRD on dialysis ([place]) 04/06/2017  . Anemia due to chronic kidney disease 04/06/2017  . Thrombocytopenia ([place]) 04/06/2017  . Cough in adult 04/06/2017  . DVT (deep venous thrombosis) ([place]) 04/05/2017  . Pressure injury of skin 03/14/2017  . Acute metabolic encephalopathy [id number]  . Skin lesion of hand 03/12/2017  . TIA (transient ischemic attack) 03/01/2017  . Syncope 03/01/2017  . Diabetes mellitus with complication ([place]) [id number]  . Essential hypertension 03/01/2017  . Chronic diastolic CHF (congestive heart failure) ([place]) 03/01/2017    Past Surgical History:  Procedure Laterality Date  . AV FISTULA PLACEMENT Left 03/16/2017   Procedure: INSERTION OF ARTERIOVENOUS (AV) GORE-TEX GRAFT THIGH-LEFT;  Surgeon: Angelia Mould, MD;  Location: [facility];  Service: Vascular;  Laterality: Left;  . CHOLECYSTECTOMY    . DIALYSIS FISTULA CREATION     clotted off  . FALSE ANEURYSM REPAIR Left 05/10/2017   Procedure: REPAIR FALSE ANEURYSM left thigh AVGG using Gore 5mx5cm Viabahn Endoprosthesis;  Surgeon: BSerafina Mitchell MD;  [facility];  Service: Vascular;  Laterality: Left;  . I & D EXTREMITY Left 07/14/2017   Procedure: IRRIGATION AND  DEBRIDEMENT DISTAL THIGH ABSCESS;  Surgeon: Angelia Mould, MD;  Location: [facility];  Service: Vascular;  Laterality: Left;  . INSERTION OF DIALYSIS CATHETER Right 03/16/2017   Procedure: INSERTION OF RIGHT FEMORAL TUNNELED DIALYSIS CATHETER;  Surgeon: Angelia Mould, MD;  Location: [facility];  Service: Vascular;  Laterality: Right;  . IR AV DIALY SHUNT INTRO [place] W/PTA/IMG LEFT  11/16/2017  . IR DIALY SHUNT  INTRO NEEDLE/INTRACATH INITIAL W/IMG LEFT Left 01/10/2019  . IR THROMBECTOMY AV FISTULA W/THROMBOLYSIS/PTA INC/SHUNT/IMG LEFT Left 06/22/2017  . IR THROMBECTOMY AV FISTULA W/THROMBOLYSIS/PTA/STENT INC/SHUNT/IMG LT Left 03/30/2018  . IR US GUIDE VASC ACCESS LEFT  06/22/2017  . IR US GUIDE VASC ACCESS LEFT  03/30/2018  . IVC FILTER INSERTION    . PORTACATH PLACEMENT    . THROMBECTOMY AND REVISION OF ARTERIOVENTOUS (AV) GORETEX  GRAFT Left 06/02/2017   Procedure: EVACUATION OF LEFT THIGH HEMATOMA  AND APPLICATION OF WOUND VAC;  Surgeon: Serafina Mitchell, MD;  Location: [facility];  Service: Vascular;  Laterality: Left;     OB History   No obstetric history on file.     Family History  Problem Relation Age of Onset  . Breast cancer Mother   . Diabetes Sister   . Diabetes Brother   . CAD Other   . Stroke Neg Hx     Social History   Tobacco Use  . Smoking status: Never Smoker  . Smokeless tobacco: Never Used  Vaping Use  . Vaping Use: Never used  Substance Use Topics  . Alcohol use: No  . Drug use: No    Home Medications Prior to Admission medications   Medication Sig Start Date End Date Taking? Authorizing Provider  ACCU-CHEK AVIVA PLUS test strip 1 each 3 (three) times daily. 04/15/20   [provider]  albuterol (PROVENTIL) (2.5 MG/3ML) 0.083% nebulizer solution Take 3 mLs (2.5 mg total) by nebulization every 4 (four) hours as needed for wheezing or shortness of breath. Diagnosis COPD J44.9 12/20/18   Collene Gobble, MD  albuterol (VENTOLIN HFA) 108 (90 Base) MCG/ACT inhaler Inhale 2 puffs into the lungs every 4 (four) hours as needed for wheezing or shortness of breath.     [provider]  Amino Acids-Protein Hydrolys (FEEDING SUPPLEMENT, PRO-STAT SUGAR FREE 64,) LIQD Take 30 mLs by mouth 2 (two) times daily. 04/12/17   Sheikh, Omair Latif, DO  amLODipine (NORVASC) 5 MG tablet Take 5 mg by mouth at bedtime. Patient not taking: Reported on 10/15/2020 03/11/20    [provider]  BD PEN NEEDLE NANO U/F 32G X 4 MM MISC 3 (three) times daily.  01/18/19   [provider]  calcitRIOL (ROCALTROL) 0.5 MCG capsule Take 0.5 mcg by mouth daily.    [provider]  carvedilol (COREG) 3.125 MG tablet Take 3.125 mg by mouth 2 (two) times daily with a meal.  Patient not taking: Reported on 10/15/2020 04/16/18   [provider]  cinacalcet (SENSIPAR) 30 MG tablet Take 30 mg by mouth 3 (three) times a week.  02/11/19   [provider]  Darbepoetin Alfa (ARANESP, ALBUMIN FREE,) 40 MCG/ML SOLN Aranesp (in polysorbate) 40 mcg/mL 03/28/20   [provider]  docusate sodium (COLACE) 100 MG capsule Take 1 capsule (100 mg total) by mouth 2 (two) times daily. 04/12/17   Raiford Noble Latif, DO  famotidine (PEPCID) 20 MG tablet Take 1 tablet (20 mg total) daily by mouth. 04/28/17   Lavina Hamman, MD  gabapentin (NEURONTIN) 100 MG capsule Take 100 mg by mouth at bedtime.  01/19/19   [provider]  HUMULIN R 100 UNIT/ML injection  04/20/20   [provider]  HYDROcodone-acetaminophen (NORCO/VICODIN) 5-325 MG tablet Take 1 tablet by mouth 3 (three) times daily as needed. Patient not taking: Reported on 10/15/2020 04/08/20   [provider]  lidocaine-prilocaine (EMLA) cream Apply 1 application topically as directed.  10/10/18   [provider]  midodrine (PROAMATINE) 10 MG tablet Take 1 tablet (10 mg total) by mouth every Monday, Wednesday, and Friday with hemodialysis. 04/14/17   Raiford Noble Latif, DO  multivitamin (RENA-VIT) TABS tablet Take 1 tablet by mouth at bedtime. 04/12/17   Raiford Noble Latif, DO  Oxycodone HCl 10 MG TABS Take 10 mg by mouth 3 (three) times daily as needed (pain).    [provider]  pantoprazole (PROTONIX) 40 MG tablet Take 1 tablet (40 mg total) 2 (two) times daily before a meal by mouth. 04/28/17   Lavina Hamman, MD  polyethylene glycol (MIRALAX / GLYCOLAX)  17 g packet Take 17 g by mouth daily. Patient not taking: Reported on 10/15/2020 03/19/20   Milton Ferguson, MD  sertraline (ZOLOFT) 50 MG tablet Take 1 tablet (50 mg total) by mouth daily. 03/26/20   Benay Pike, MD  sevelamer carbonate (RENVELA) 800 MG tablet Take 800 mg by mouth 3 (three) times daily with meals.    [provider]  simvastatin (ZOCOR) 40 MG tablet Take 40 mg by mouth daily at 6 PM.    [provider]  sucralfate (CARAFATE) 1 GM/10ML suspension Take 10 mLs (1 g total) 4 (four) times daily -  with meals and at bedtime by mouth. Patient not taking: Reported on 10/15/2020 04/28/17   Lavina Hamman, MD  thiamine (VITAMIN B-1) 100 MG tablet Take 1 tablet (100 mg total) by mouth daily. Patient not taking: Reported on 10/15/2020 06/09/17   Dessa Phi, DO  warfarin (COUMADIN) 5 MG tablet Take 5 mg by mouth daily.     [provider]  ZTLIDO 1.8 % PTCH Place 1 patch onto the skin every other day. 02/28/20   [provider]    Allergies    Patient has no known allergies.  Review of Systems   Review of Systems  Constitutional: Negative for chills and fever.  HENT: Negative for ear pain and sore throat.   Eyes: Negative for pain and visual disturbance.  Respiratory: Negative for cough and shortness of breath.   Cardiovascular: Negative for chest pain and palpitations.  Gastrointestinal: Negative for abdominal pain and vomiting.  Genitourinary: Negative for dysuria and hematuria.  Musculoskeletal: Negative for arthralgias and back pain.  Skin: Negative for color change and rash.  Neurological: Positive for weakness (generalized). Negative for seizures and syncope.  All other systems reviewed and are negative.   Physical Exam Updated Vital Signs BP (!) 145/48   Pulse 72   Temp 98.7 F (37.1 C)   Resp 15   SpO2 95%   Physical Exam Vitals and nursing note reviewed.  Constitutional:      General: She is not in acute distress.     Appearance: She is well-developed.  HENT:     Head: Normocephalic and atraumatic.  Eyes:     Conjunctiva/sclera: Conjunctivae normal.  Cardiovascular:     Rate and Rhythm: Normal rate and regular rhythm.     Heart sounds: No murmur heard.     Comments: Palpable thrill  in AV fistula in L thigh Small scabbed lesion on fistula no active bleeding Pulmonary:     Effort: Pulmonary effort is normal. No respiratory distress.     Breath sounds: Normal breath sounds.  Abdominal:     General: There is no distension.     Palpations: Abdomen is soft.  Musculoskeletal:     Cervical back: Normal range of motion and neck supple.  Skin:    General: Skin is warm and dry.  Neurological:     General: No focal deficit present.     Mental Status: She is alert.     Cranial Nerves: No cranial nerve deficit.     ED Results / Procedures / Treatments   Labs (all labs ordered are listed, but only abnormal results are displayed) Labs Reviewed  CBC WITH DIFFERENTIAL/PLATELET - Abnormal; Notable for the following components:      Result Value   RBC 2.97 (*)    Hemoglobin 7.8 (*)    HCT 25.1 (*)    RDW 18.2 (*)    All other components within normal limits  BASIC METABOLIC PANEL - Abnormal; Notable for the following components:   Potassium 5.5 (*)    Glucose, Bld 109 (*)    BUN 27 (*)    Creatinine, Ser 7.38 (*)    Calcium 8.6 (*)    GFR, Estimated 5 (*)    All other components within normal limits    EKG None  Radiology No results found.  Procedures Procedures   Medications Ordered in ED Medications - No data to display  ED Course  I have reviewed the triage vital signs and the nursing notes.  Pertinent labs & imaging results that were available during my care of the patient were reviewed by me and considered in my medical decision making (see chart for details).    MDM Rules/Calculators/A&P                          Patient presents due to inability to get dialysis Fistula exam  is reassuring Labs show mild hyperkalemia and Hb drop; she has not rebled, Hb is likely dilutional, explains the weakness. No CAD to indicate emergent transfusion but this will need close follow-up and this  Was discussed No shortness of breath or oxygen requirement; chest x-ray is not emergently indicated. Will get ECG due to hyperkalemia ECG pending at time of handoff. Nephrology evaluated, requested vascular consultation for eval to see if this is operative. Otherwise nephro feels appropriate for d/c and f/u tomorrow. Paged vascular, waiting to hear back at time of handoff Care handed off to Dr. Miki Kins. ECG, vascular consultation, family discussion pending.   Final Clinical Impression(s) / ED Diagnoses Final diagnoses:  None    Rx / DC Orders ED Discharge Orders    None       Aris Lot, MD 10/22/20 1541    Lucrezia Starch, MD 10/23/20 2020

## 2020-10-22 NOTE — ED Provider Notes (Signed)
Emergency Medicine Provider Triage Evaluation Note  Ashlee Miles , a 80 y.o. female  was evaluated in triage.  Pt complains of dialysis graft bleeding  Review of Systems  Positive: Graft bleeding Negative:   Physical Exam  BP (!) 162/68 (BP Location: Right Arm)   Pulse 72   Temp 98.7 F (37.1 C)   Resp 16   SpO2 100%  Gen:   Awake, no distress   Resp:  Normal effort  MSK:   Moves extremities without difficulty  Other:    Medical Decision Making  Medically screening exam initiated at 12:49 PM.  Appropriate orders placed.  Ashlee Miles was informed that the remainder of the evaluation will be completed by another provider, this initial triage assessment does not replace that evaluation, and the importance of remaining in the ED until their evaluation is complete.     Sidney Ace 10/22/20 1250    Davonna Belling, MD 10/22/20 [phone]

## 2020-10-22 NOTE — Progress Notes (Signed)
Patient to go to operating room tomorrow.  Vein and vascular surgery requested dialysis 10/22/2020   blood pressure 139/79 pulse 75 temperature 98.7 O2 sats 99% room  Sodium 140 potassium 5.5 chloride 102 CO2 26 BUN 27 creatinine 7.38 glucose 109 calcium 8.6 hemoglobin 7.8  Dialysis orders.  180 NRE Optiflex   BFR 400 DFR 500 EDW 90.5                             Duration 4 hours    Average fluid gains 3 to 4 L.  Not reaching goal  Has missed 5 dialysis treatments last 30 days  Micera 100 mcg every 2 weeks Iron sucrose 50 mg weekly Calcitriol 1 mcg 3 times weekly

## 2020-10-22 NOTE — ED Notes (Signed)
Report attempted, no

## 2020-10-22 NOTE — ED Notes (Signed)
Pt not wanting to be stuck and wants her port accessed. IV consult placed. Both ED and admitting providers are ok with waiting for port to be accessed for pending blood work.

## 2020-10-22 NOTE — ED Notes (Signed)
Report called to Threasa Beards, RN denied questions or concerns.

## 2020-10-22 NOTE — ED Provider Notes (Signed)
History of ESRD on dialysis For eschar on thigh fistula Nephrology evaluated patient, would like vascular surgery consult Physical Exam  BP 134/61   Pulse 75   Temp 98.7 F (37.1 C)   Resp 15   SpO2 99%   Physical Exam  ED Course/Procedures     Procedures  MDM  Vascular surgery came to evaluate patient.  The state is okay to dialyze around fistula.  We will plan procedure for tomorrow.  Recommend admission for this to be accomplished.  Patient updated and in agreement with plan.  Admitted to hospitalist service with no acute change in status.       Suzan Nailer, DO 10/22/20 2107    Tegeler, Gwenyth Allegra, MD 10/22/20 2134

## 2020-10-22 NOTE — H&P (Addendum)
Hospital Consult    Reason for Consult: Ulceration over left thigh AV graft Referring Physician: ED MRN #:  [id number]  History of Present Illness: This is a 80 y.o. female with history of end-stage renal disease on hemodialysis Monday Wednesday Friday, diabetes, CHF, hx DVT/PE on coumadin that vascular surgery has been consulted for ulceration over her left thigh AV graft.  Patient states she was sent here today by her dialysis center.  She was seen by nephrology who asked vascular surgery to be consulted.  She's had no bleeding today and currently there is no bleeding over the AV graft.  She did state that there was some bleeding from this ulcerated area several days ago that was controlled manual pressure.  It appears her left thigh AV graft was placed in 2018 by Dr. Scot Dock.  She has had additional intervention with Viabahn covered stenting of the venous outflow.  Of note, her last INR 3 days ago was 3.4.    Past Medical History:  Diagnosis Date  . CHF (congestive heart failure) ([place])   . Diabetes mellitus without complication ([place])   . ESRD (end stage renal disease) on dialysis ([place])   . History of pituitary adenoma   . Hypertension   . Protein C deficiency [facility])     Past Surgical History:  Procedure Laterality Date  . AV FISTULA PLACEMENT Left 03/16/2017   Procedure: INSERTION OF ARTERIOVENOUS (AV) GORE-TEX GRAFT THIGH-LEFT;  Surgeon: Angelia Mould, MD;  Location: [place];  Service: Vascular;  Laterality: Left;  . CHOLECYSTECTOMY    . DIALYSIS FISTULA CREATION     clotted off  . FALSE ANEURYSM REPAIR Left 05/10/2017   Procedure: REPAIR FALSE ANEURYSM left thigh AVGG using Gore 5mx5cm Viabahn Endoprosthesis;  Surgeon: BSerafina Mitchell MD;  [facility];  Service: Vascular;  Laterality: Left;  . I & D EXTREMITY Left 07/14/2017   Procedure: IRRIGATION AND DEBRIDEMENT DISTAL THIGH ABSCESS;  Surgeon: DAngelia Mould MD;  [place]  Service: Vascular;   Laterality: Left;  . INSERTION OF DIALYSIS CATHETER Right 03/16/2017   Procedure: INSERTION OF RIGHT FEMORAL TUNNELED DIALYSIS CATHETER;  Surgeon: DAngelia Mould MD;  [place]  Service: Vascular;  Laterality: Right;  . IR AV DIALY SHUNT INTRO [place]/PTA/IMG LEFT  11/16/2017  . IR DIALY SHUNT INTRO NEEDLE/INTRACATH INITIAL W/IMG LEFT Left 01/10/2019  . IR THROMBECTOMY AV FISTULA W/THROMBOLYSIS/PTA INC/SHUNT/IMG LEFT Left 06/22/2017  . IR THROMBECTOMY AV FISTULA W/THROMBOLYSIS/PTA/STENT INC/SHUNT/IMG LT Left 03/30/2018  . IR [place] VASC ACCESS LEFT  06/22/2017  . IR [place] VASC ACCESS LEFT  03/30/2018  . IVC FILTER INSERTION    . PORTACATH PLACEMENT    . THROMBECTOMY AND REVISION OF ARTERIOVENTOUS (AV) GORETEX  GRAFT Left 06/02/2017   Procedure: EVACUATION OF LEFT THIGH HEMATOMA  AND APPLICATION OF WOUND VAC;  Surgeon: BSerafina Mitchell MD;  [place]  Service: Vascular;  Laterality: Left;    No Known Allergies  Prior to Admission medications   Medication Sig Start Date End Date Taking? Authorizing Provider  ACCU-CHEK AVIVA PLUS test strip 1 each 3 (three) times daily. 04/15/20   [provider]  albuterol (PROVENTIL) (2.5 MG/3ML) 0.083% nebulizer solution Take 3 mLs (2.5 mg total) by nebulization every 4 (four) hours as needed for wheezing or shortness of breath. Diagnosis COPD J44.9 12/20/18   BCollene Gobble MD  albuterol (VENTOLIN HFA) 108 (90 Base) MCG/ACT inhaler Inhale 2 puffs into the lungs every  4 (four) hours as needed for wheezing or shortness of breath.     [provider]  Amino Acids-Protein Hydrolys (FEEDING SUPPLEMENT, PRO-STAT SUGAR FREE 64,) LIQD Take 30 mLs by mouth 2 (two) times daily. 04/12/17   Sheikh, Omair Latif, DO  amLODipine (NORVASC) 5 MG tablet Take 5 mg by mouth at bedtime. Patient not taking: Reported on 10/15/2020 03/11/20   [provider]  BD PEN NEEDLE NANO U/F 32G X 4 MM MISC 3 (three) times  daily.  01/18/19   [provider]  calcitRIOL (ROCALTROL) 0.5 MCG capsule Take 0.5 mcg by mouth daily.    [provider]  carvedilol (COREG) 3.125 MG tablet Take 3.125 mg by mouth 2 (two) times daily with a meal.  Patient not taking: Reported on 10/15/2020 04/16/18   [provider]  cinacalcet (SENSIPAR) 30 MG tablet Take 30 mg by mouth 3 (three) times a week.  02/11/19   [provider]  Darbepoetin Alfa (ARANESP, ALBUMIN FREE,) 40 MCG/ML SOLN Aranesp (in polysorbate) 40 mcg/mL 03/28/20   [provider]  docusate sodium (COLACE) 100 MG capsule Take 1 capsule (100 mg total) by mouth 2 (two) times daily. 04/12/17   Raiford Noble Latif, DO  famotidine (PEPCID) 20 MG tablet Take 1 tablet (20 mg total) daily by mouth. 04/28/17   Lavina Hamman, MD  gabapentin (NEURONTIN) 100 MG capsule Take 100 mg by mouth at bedtime.  01/19/19   [provider]  HUMULIN R 100 UNIT/ML injection  04/20/20   [provider]  HYDROcodone-acetaminophen (NORCO/VICODIN) 5-325 MG tablet Take 1 tablet by mouth 3 (three) times daily as needed. Patient not taking: Reported on 10/15/2020 04/08/20   [provider]  lidocaine-prilocaine (EMLA) cream Apply 1 application topically as directed.  10/10/18   [provider]  midodrine (PROAMATINE) 10 MG tablet Take 1 tablet (10 mg total) by mouth every Monday, Wednesday, and Friday with hemodialysis. 04/14/17   Raiford Noble Latif, DO  multivitamin (RENA-VIT) TABS tablet Take 1 tablet by mouth at bedtime. 04/12/17   Raiford Noble Latif, DO  Oxycodone HCl 10 MG TABS Take 10 mg by mouth 3 (three) times daily as needed (pain).    [provider]  pantoprazole (PROTONIX) 40 MG tablet Take 1 tablet (40 mg total) 2 (two) times daily before a meal by mouth. 04/28/17   Lavina Hamman, MD  polyethylene glycol (MIRALAX / GLYCOLAX) 17 g packet Take 17 g by mouth daily. Patient not taking: Reported on 10/15/2020  03/19/20   Milton Ferguson, MD  sertraline (ZOLOFT) 50 MG tablet Take 1 tablet (50 mg total) by mouth daily. 03/26/20   Benay Pike, MD  sevelamer carbonate (RENVELA) 800 MG tablet Take 800 mg by mouth 3 (three) times daily with meals.    [provider]  simvastatin (ZOCOR) 40 MG tablet Take 40 mg by mouth daily at 6 PM.    [provider]  sucralfate (CARAFATE) 1 GM/10ML suspension Take 10 mLs (1 g total) 4 (four) times daily -  with meals and at bedtime by mouth. Patient not taking: Reported on 10/15/2020 04/28/17   Lavina Hamman, MD  thiamine (VITAMIN B-1) 100 MG tablet Take 1 tablet (100 mg total) by mouth daily. Patient not taking: Reported on 10/15/2020 06/09/17   Dessa Phi, DO  warfarin (COUMADIN) 5 MG tablet Take 5 mg by mouth daily.     [provider]  ZTLIDO 1.8 % PTCH Place 1 patch onto  the skin every other day. 02/28/20   [provider]    Social History   Socioeconomic History  . Marital status: Married    Spouse name: Not on file  . Number of children: Not on file  . Years of education: Not on file  . Highest education level: Not on file  Occupational History  . Occupation: retired  Tobacco Use  . Smoking status: Never Smoker  . Smokeless tobacco: Never Used  Vaping Use  . Vaping Use: Never used  Substance and Sexual Activity  . Alcohol use: No  . Drug use: No  . Sexual activity: Not on file  Other Topics Concern  . Not on file  Social History Narrative  . Not on file   Social Determinants of Health   Financial Resource Strain: Not on file  Food Insecurity: Not on file  Transportation Needs: Not on file  Physical Activity: Not on file  Stress: Not on file  Social Connections: Not on file  Intimate Partner Violence: Not on file     Family History  Problem Relation Age of Onset  . Breast cancer Mother   . Diabetes Sister   . Diabetes Brother   . CAD Other   . Stroke Neg Hx     ROS: '[x]'$  Positive   '[ ]'$   Negative   '[ ]'$  All sytems reviewed and are negative  Cardiovascular: '[]'$  chest pain/pressure '[]'$  palpitations '[]'$  SOB lying flat '[]'$  DOE '[]'$  pain in legs while walking '[]'$  pain in legs at rest '[]'$  pain in legs at night '[]'$  non-healing ulcers '[]'$  hx of DVT '[]'$  swelling in legs  Pulmonary: '[]'$  productive cough '[]'$  asthma/wheezing '[]'$  home O2  Neurologic: '[]'$  weakness in '[]'$  arms '[]'$  legs '[]'$  numbness in '[]'$  arms '[]'$  legs '[]'$  hx of CVA '[]'$  mini stroke '[]'$ difficulty speaking or slurred speech '[]'$  temporary loss of vision in one eye '[]'$  dizziness  Hematologic: '[]'$  hx of cancer '[]'$  bleeding problems '[]'$  problems with blood clotting easily  Endocrine:   '[]'$  diabetes '[]'$  thyroid disease  GI '[]'$  vomiting blood '[]'$  blood in stool  GU: '[]'$  CKD/renal failure '[]'$  HD--'[]'$  M/W/F or '[]'$  T/T/S '[]'$  burning with urination '[]'$  blood in urine  Psychiatric: '[]'$  anxiety '[]'$  depression  Musculoskeletal: '[]'$  arthritis '[]'$  joint pain  Integumentary: '[]'$  rashes '[]'$  ulcers  Constitutional: '[]'$  fever '[]'$  chills   Physical Examination  Vitals:   10/22/20 1515 10/22/20 1530  BP: (!) 151/49 (!) 145/48  Pulse: 73 72  Resp: 18 15  Temp:    SpO2: 97% 95%   There is no height or weight on file to calculate BMI.  General: NAD Gait: Not observed HENT: WNL, normocephalic Pulmonary: no respiratory distress Cardiac: regular Abdomen: soft, NT/ND Vascular Exam/Pulses: Left thigh AV graft with appreciable thrill.  There is an ulceration with scab on the lateral side of the graft on the arterial side as pictured below Musculoskeletal: no muscle wasting or atrophy  Neurologic: A&O X 3; Appropriate Affect ; SENSATION: normal; MOTOR FUNCTION:  moving all extremities equally. Speech is fluent/normal       CBC    Component Value Date/Time   WBC 6.8 10/22/2020 1259   RBC 2.97 (L) 10/22/2020 1259   HGB 7.8 (L) 10/22/2020 1259   HCT 25.1 (L) 10/22/2020 1259   PLT 216 10/22/2020 1259   MCV 84.5 10/22/2020 1259   MCH  26.3 10/22/2020 1259   MCHC 31.1 10/22/2020 1259   RDW 18.2 (H) 10/22/2020 1259  LYMPHSABS 1.5 10/22/2020 1259   MONOABS 0.6 10/22/2020 1259   EOSABS 0.3 10/22/2020 1259   BASOSABS 0.0 10/22/2020 1259    BMET    Component Value Date/Time   NA 140 10/22/2020 1259   K 5.5 (H) 10/22/2020 1259   CL 102 10/22/2020 1259   CO2 26 10/22/2020 1259   GLUCOSE 109 (H) 10/22/2020 1259   BUN 27 (H) 10/22/2020 1259   CREATININE 7.38 (H) 10/22/2020 1259   CALCIUM 8.6 (L) 10/22/2020 1259   CALCIUM 7.8 (L) 03/13/2017 1559   GFRNONAA 5 (L) 10/22/2020 1259   GFRAA 6 (L) 03/24/2020 0500    COAGS: Lab Results  Component Value Date   INR 3.4 (H) 10/19/2020   INR 2.0 (H) 03/26/2020   INR 2.2 (H) 03/25/2020     Non-Invasive Vascular Imaging:    None   ASSESSMENT/PLAN: This is a 80 y.o. female with multiple medical comorbidities including end-stage renal disease on HD that vascular surgery has been consulted for ulceration over her left thigh AV graft.  There is no active bleeding and she has had no bleeding today at the time of my exam in the ED.  She states she had a minor bleeding event several days ago from this area that resolved with manual pressure.  Given these findings it would be reasonable to go ahead and get her admitted with plans for left thigh AV graft revision.  I would plan likely interposition graft around the ulcerated area.  I did review her old fistulogram images and she has multiple covered Viabahn stents of the venous outflow side of the graft but the ulceration is on the arterial side and should be amendable to revision in the OR.  That being said she is on Coumadin and her last INR earlier this week was 3.2.  I have asked the ED to recheck an INR today.  Will likely need her INR corrected.  In addition her potassium is elevated and I would try and get her dialyzed in preparation for the OR to prevent her from being canceled.  It should be okay to stick the graft away from the  ulcerated area.  I have tentatively put her on the schedule for tomorrow with one of my partners so please keep her n.p.o. after midnight.  Covid test ordered.  Certainly if her INR is not corrected and she cannot proceed to the OR likely will have to do her Monday.     Marty Heck, MD [facility] Office: [place]

## 2020-10-23 ENCOUNTER — Other Ambulatory Visit (HOSPITAL_COMMUNITY): Payer: Self-pay

## 2020-10-23 ENCOUNTER — Encounter (HOSPITAL_COMMUNITY): Payer: Self-pay | Admitting: Internal Medicine

## 2020-10-23 DIAGNOSIS — E1122 Type 2 diabetes mellitus with diabetic chronic kidney disease: Secondary | ICD-10-CM | POA: Diagnosis present

## 2020-10-23 DIAGNOSIS — Z9049 Acquired absence of other specified parts of digestive tract: Secondary | ICD-10-CM | POA: Diagnosis not present

## 2020-10-23 DIAGNOSIS — D6859 Other primary thrombophilia: Secondary | ICD-10-CM | POA: Diagnosis present

## 2020-10-23 DIAGNOSIS — D62 Acute posthemorrhagic anemia: Secondary | ICD-10-CM | POA: Diagnosis present

## 2020-10-23 DIAGNOSIS — Y832 Surgical operation with anastomosis, bypass or graft as the cause of abnormal reaction of the patient, or of later complication, without mention of misadventure at the time of the procedure: Secondary | ICD-10-CM | POA: Diagnosis present

## 2020-10-23 DIAGNOSIS — D631 Anemia in chronic kidney disease: Secondary | ICD-10-CM | POA: Diagnosis present

## 2020-10-23 DIAGNOSIS — I5032 Chronic diastolic (congestive) heart failure: Secondary | ICD-10-CM | POA: Diagnosis present

## 2020-10-23 DIAGNOSIS — N186 End stage renal disease: Secondary | ICD-10-CM | POA: Diagnosis present

## 2020-10-23 DIAGNOSIS — Z66 Do not resuscitate: Secondary | ICD-10-CM | POA: Diagnosis present

## 2020-10-23 DIAGNOSIS — R1084 Generalized abdominal pain: Secondary | ICD-10-CM | POA: Diagnosis not present

## 2020-10-23 DIAGNOSIS — E8889 Other specified metabolic disorders: Secondary | ICD-10-CM | POA: Diagnosis present

## 2020-10-23 DIAGNOSIS — Z803 Family history of malignant neoplasm of breast: Secondary | ICD-10-CM | POA: Diagnosis not present

## 2020-10-23 DIAGNOSIS — Z7901 Long term (current) use of anticoagulants: Secondary | ICD-10-CM | POA: Diagnosis not present

## 2020-10-23 DIAGNOSIS — Z8673 Personal history of transient ischemic attack (TIA), and cerebral infarction without residual deficits: Secondary | ICD-10-CM | POA: Diagnosis not present

## 2020-10-23 DIAGNOSIS — T829XXA Unspecified complication of cardiac and vascular prosthetic device, implant and graft, initial encounter: Secondary | ICD-10-CM | POA: Diagnosis present

## 2020-10-23 DIAGNOSIS — Z992 Dependence on renal dialysis: Secondary | ICD-10-CM | POA: Diagnosis not present

## 2020-10-23 DIAGNOSIS — K219 Gastro-esophageal reflux disease without esophagitis: Secondary | ICD-10-CM | POA: Diagnosis present

## 2020-10-23 DIAGNOSIS — Z8249 Family history of ischemic heart disease and other diseases of the circulatory system: Secondary | ICD-10-CM | POA: Diagnosis not present

## 2020-10-23 DIAGNOSIS — E875 Hyperkalemia: Secondary | ICD-10-CM | POA: Diagnosis present

## 2020-10-23 DIAGNOSIS — Z20822 Contact with and (suspected) exposure to covid-19: Secondary | ICD-10-CM | POA: Diagnosis present

## 2020-10-23 DIAGNOSIS — F1123 Opioid dependence with withdrawal: Secondary | ICD-10-CM | POA: Diagnosis not present

## 2020-10-23 DIAGNOSIS — Z79899 Other long term (current) drug therapy: Secondary | ICD-10-CM | POA: Diagnosis not present

## 2020-10-23 DIAGNOSIS — T82838A Hemorrhage of vascular prosthetic devices, implants and grafts, initial encounter: Secondary | ICD-10-CM | POA: Diagnosis present

## 2020-10-23 DIAGNOSIS — T82898A Other specified complication of vascular prosthetic devices, implants and grafts, initial encounter: Secondary | ICD-10-CM | POA: Diagnosis not present

## 2020-10-23 DIAGNOSIS — Z86718 Personal history of other venous thrombosis and embolism: Secondary | ICD-10-CM | POA: Diagnosis not present

## 2020-10-23 DIAGNOSIS — I132 Hypertensive heart and chronic kidney disease with heart failure and with stage 5 chronic kidney disease, or end stage renal disease: Secondary | ICD-10-CM | POA: Diagnosis present

## 2020-10-23 DIAGNOSIS — R1013 Epigastric pain: Secondary | ICD-10-CM | POA: Diagnosis not present

## 2020-10-23 DIAGNOSIS — I10 Essential (primary) hypertension: Secondary | ICD-10-CM | POA: Diagnosis not present

## 2020-10-23 DIAGNOSIS — Z794 Long term (current) use of insulin: Secondary | ICD-10-CM | POA: Diagnosis not present

## 2020-10-23 DIAGNOSIS — Z833 Family history of diabetes mellitus: Secondary | ICD-10-CM | POA: Diagnosis not present

## 2020-10-23 LAB — RENAL FUNCTION PANEL
Albumin: 2.8 g/dL — ABNORMAL LOW (ref 3.5–5.0)
Anion gap: 8 (ref 5–15)
BUN: 9 mg/dL (ref 8–23)
CO2: 28 mmol/L (ref 22–32)
Calcium: 8.3 mg/dL — ABNORMAL LOW (ref 8.9–10.3)
Chloride: 100 mmol/L (ref 98–111)
Creatinine, Ser: 3.67 mg/dL — ABNORMAL HIGH (ref 0.44–1.00)
GFR, Estimated: 12 mL/min — ABNORMAL LOW (ref >60)
Glucose, Bld: 82 mg/dL (ref 70–99)
Phosphorus: 3.2 mg/dL (ref 2.5–4.6)
Potassium: 3.6 mmol/L (ref 3.5–5.1)
Sodium: 136 mmol/L (ref 135–145)

## 2020-10-23 LAB — PREPARE RBC (CROSSMATCH)

## 2020-10-23 LAB — SARS CORONAVIRUS 2 (TAT 6-24 HRS): SARS Coronavirus 2: NEGATIVE

## 2020-10-23 LAB — CBC
HCT: 21.8 % — ABNORMAL LOW (ref 36.0–46.0)
Hemoglobin: 6.8 g/dL — CL (ref 12.0–15.0)
MCH: 25.8 pg — ABNORMAL LOW (ref 26.0–34.0)
MCHC: 31.2 g/dL (ref 30.0–36.0)
MCV: 82.6 fL (ref 80.0–100.0)
Platelets: 196 10*3/uL (ref 150–400)
RBC: 2.64 MIL/uL — ABNORMAL LOW (ref 3.87–5.11)
RDW: 17.8 % — ABNORMAL HIGH (ref 11.5–15.5)
WBC: 5.8 10*3/uL (ref 4.0–10.5)
nRBC: 0 % (ref 0.0–0.2)

## 2020-10-23 LAB — MRSA PCR SCREENING: MRSA by PCR: NEGATIVE

## 2020-10-23 LAB — HEMOGLOBIN AND HEMATOCRIT, BLOOD
HCT: 27.2 % — ABNORMAL LOW (ref 36.0–46.0)
Hemoglobin: 9 g/dL — ABNORMAL LOW (ref 12.0–15.0)

## 2020-10-23 LAB — PROTIME-INR
INR: 3.2 — ABNORMAL HIGH (ref 0.8–1.2)
INR: 2.2 — ABNORMAL HIGH (ref 0.8–1.2)
Prothrombin Time: 32.5 seconds — ABNORMAL HIGH (ref 11.4–15.2)
Prothrombin Time: 24 seconds — ABNORMAL HIGH (ref 11.4–15.2)

## 2020-10-23 LAB — APTT: aPTT: 50 seconds — ABNORMAL HIGH (ref 24–36)

## 2020-10-23 MED ORDER — CALCITRIOL 0.25 MCG PO CAPS
1.0000 ug | ORAL_CAPSULE | Freq: Once | ORAL | Status: AC
  Administered 2020-10-23: 1 ug via ORAL
  Filled 2020-10-23: qty 4

## 2020-10-23 MED ORDER — SERTRALINE HCL 50 MG PO TABS
50.0000 mg | ORAL_TABLET | Freq: Every day | ORAL | Status: DC
  Administered 2020-10-23 – 2020-11-05 (×13): 50 mg via ORAL
  Filled 2020-10-23 (×14): qty 1

## 2020-10-23 MED ORDER — CINACALCET HCL 30 MG PO TABS
30.0000 mg | ORAL_TABLET | Freq: Once | ORAL | Status: AC
  Administered 2020-10-23: 30 mg via ORAL
  Filled 2020-10-23: qty 1

## 2020-10-23 MED ORDER — CALCITRIOL 0.25 MCG PO CAPS
1.0000 ug | ORAL_CAPSULE | ORAL | Status: DC
  Administered 2020-10-28 – 2020-11-04 (×3): 1 ug via ORAL
  Filled 2020-10-23 (×2): qty 4

## 2020-10-23 MED ORDER — PANTOPRAZOLE SODIUM 40 MG PO TBEC
40.0000 mg | DELAYED_RELEASE_TABLET | Freq: Two times a day (BID) | ORAL | Status: DC
  Administered 2020-10-23 – 2020-11-05 (×25): 40 mg via ORAL
  Filled 2020-10-23 (×27): qty 1

## 2020-10-23 MED ORDER — CINACALCET HCL 30 MG PO TABS
30.0000 mg | ORAL_TABLET | ORAL | Status: DC
  Administered 2020-10-28 – 2020-11-04 (×3): 30 mg via ORAL
  Filled 2020-10-23 (×3): qty 1

## 2020-10-23 MED ORDER — SODIUM CHLORIDE 0.9% IV SOLUTION: Freq: Once | INTRAVENOUS | Status: AC

## 2020-10-23 MED ORDER — SIMVASTATIN 20 MG PO TABS
40.0000 mg | ORAL_TABLET | Freq: Every day | ORAL | Status: DC
  Administered 2020-10-23 – 2020-11-05 (×14): 40 mg via ORAL
  Filled 2020-10-23 (×15): qty 2

## 2020-10-23 MED ORDER — SEVELAMER CARBONATE 800 MG PO TABS
800.0000 mg | ORAL_TABLET | Freq: Three times a day (TID) | ORAL | Status: DC
  Administered 2020-10-23 – 2020-11-05 (×27): 800 mg via ORAL
  Filled 2020-10-23 (×31): qty 1

## 2020-10-23 MED ORDER — ALBUTEROL SULFATE (2.5 MG/3ML) 0.083% IN NEBU: 2.5000 mg | INHALATION_SOLUTION | RESPIRATORY_TRACT | Status: DC | PRN

## 2020-10-23 MED ORDER — GABAPENTIN 100 MG PO CAPS
100.0000 mg | ORAL_CAPSULE | Freq: Every day | ORAL | Status: DC
  Administered 2020-10-23 – 2020-11-05 (×14): 100 mg via ORAL
  Filled 2020-10-23 (×14): qty 1

## 2020-10-23 NOTE — Progress Notes (Signed)
Subjective:  HD1  Evaluated at bedside this AM. States she's doing alright, no current complaints. Denies bleeding or drainage from graft site. Discussed plan for revision surgery on Monday.  Objective:  Vital signs in last 24 hours: Vitals:   10/23/20 0213 10/23/20 0223 10/23/20 0417 10/23/20 0747  BP: (!) 124/39 (!) 126/54 (!) 114/34 (!) 141/43  Pulse:  81 73 73  Resp:  '16 15 16  '$ Temp:  98.7 F (37.1 C) 99.1 F (37.3 C) 98.2 F (36.8 C)  TempSrc:  Oral Oral Oral  SpO2:  99% 98% 100%  Weight:  87 kg     Physical Exam: General: Laying in bed, no acute distress CV: Regular rate, rhythm. Systolic murmur present. Pulm: Normal work of breathing, clear to auscultation bilaterally. Skin: Small ulceration over left thigh graft. No drainage, erythema, warmth. Neuro: Awake, alert, responding to questions appropriately.  CBC Latest Ref Rng & Units 10/23/2020 10/22/2020 10/18/2020  WBC 4.0 - 10.5 K/uL 5.8 6.8 5.8  Hemoglobin 12.0 - 15.0 g/dL 6.8(LL) 7.8(L) 8.4(L)  Hematocrit 36.0 - 46.0 % 21.8(L) 25.1(L) 26.5(L)  Platelets 150 - 400 K/uL 196 216 177   BMP Latest Ref Rng & Units 10/23/2020 10/22/2020 10/18/2020  Glucose 70 - 99 mg/dL 82 109(H) 219(H)  BUN 8 - 23 mg/dL 9 27(H) 40(H)  Creatinine 0.44 - 1.00 mg/dL 3.67(H) 7.38(H) 9.66(H)  Sodium 135 - 145 mmol/L 136 140 140  Potassium 3.5 - 5.1 mmol/L 3.6 5.5(H) 5.9(H)  Chloride 98 - 111 mmol/L 100 102 102  CO2 22 - 32 mmol/L '28 26 26  '$ Calcium 8.9 - 10.3 mg/dL 8.3(L) 8.6(L) 8.0(L)   Assessment/Plan: Sheera Filipi is 80yo person living with ESRD on MWF HD, HFpEF, T2DM, protein C deficiency, hx DVT/PE on Coumadin admitted 5/5 with ulceration over left thigh AV graft, planning on surgical revision on Monday 5/9.  Active Problems:   S/P arteriovenous (AV) fistula repair  #AV graft ulceration #Supratherapeutic INR Patient continues to be hemodynamically stable, no acute issues overnight. INR last night 5.2, given vitamin K with improvement  down to 3.2 this AM. However, will need to be lowered further for surgical clearance. Will continue holding anticoagulation at this time, plan to start heparin once INR subtherapeutic given she has history of protein C deficiency with hx DVT/PE. Appreciate vascular surgery recommendations. - Surgical revision Monday 5/9 with vascular - Holding anticoagulation - F/u INR in AM  #ESRD on MWF HD #Hyperkalemia, resolved Patient goes to [facility] for HD. Patient underwent dialysis last night, resolving hyperkalemia on this morning labs. Nephrology following, appreciate recommendations. - Next HD Monday per nephrology - Daily BMP   #Acute on chronic normocytic anemia Hgb 6.8 this AM from 7.8 yesterday. Patient does have chronic anemia in setting of chronic kidney disease. No obvious bleeding on exam, continues to be hemodynamically stable and asymptomatic. Will give 1u pRBC, follow-up H/H. - 1u pRBC, f/u post-transfusion H/H - Daily CBC  #Chronic diastolic heart failure #Hypertension Holding home blood pressure medications at this time given soft/low blood pressure. Can re-start pending vitals. - Hold home amlodipine, carvedilol  #Type 2 DM Glucose at goal, will continue with sliding scale, CM diet. - CBG monitoring - SSI  DIET: CM IVF: n/a DVT PPX: n/a BOWEL: n/a CODE: DNR/DNI FAM COM: n/a  Prior to Admission Living Arrangement: Home Anticipated Discharge Location: Home Barriers to Discharge: medical management Dispo: Anticipated discharge in approximately 4 day(s).   [name], MD 10/23/2020, 12:38 PM Pager: [phone]  After 5pm on weekdays and 1pm on weekends: On Call pager [phone]

## 2020-10-23 NOTE — Progress Notes (Signed)
Pt has eaten very little today and has significant lower leg pain upon movement and palpation. Has a hx of DVT and PE none of those symptoms were noted today. Need to reevaluate tomorrow.   Chrisandra Carota, RN 10/23/2020 7:00 PM

## 2020-10-23 NOTE — Progress Notes (Signed)
Patient taken to Dialysis.

## 2020-10-23 NOTE — Progress Notes (Signed)
Patient back from Dialysis.

## 2020-10-23 NOTE — Progress Notes (Signed)
Patient arrived via stretcher from [facility] and orin. Colletta [place] R.N. into to assess patient. Patient orin. To room

## 2020-10-23 NOTE — Consult Note (Signed)
Renal Service Consult Note [facility]  Ashlee Miles 10/23/2020 Sol Blazing, MD Requesting Physician: Dr. Daryll Drown  Reason for Consult: ESRD pt w/ concern for lesion on thigh graft HPI: The patient is a 80 y.o. 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  Pt had presented for HD on Thursday am (off sched d/t planned vacation) 5/05. Pt reported a significant bleeding episode at home a few days prior after removing the dressing. Went to ED 5/1 and INR was up. In HD yest 5/05 there was a distal lesion/ eschar and HD wasn't comfortable dialyzing her so she was sent to ED. We are asked to see for dialysis.   Pt w/o SOB, CP, abd pain or n/v/d.  No fevers or chills.    Pt was admitted last night and got her dialysis overnight.  K+ this am is down to wnl range.    This am pt w/o any complaints. No SOB, cough , n/v/d, abd pain or CP.    ROS  denies CP  no joint pain   no HA  no blurry vision  no rash  no diarrhea  no nausea/ vomiting    Past Medical History  Past Medical History:  Diagnosis Date  . CHF (congestive heart failure) ([place])   . Diabetes mellitus without complication ([place])   . ESRD (end stage renal disease) on dialysis ([place])   . History of pituitary adenoma   . Hypertension   . Protein C deficiency [facility])    Past Surgical History  Past Surgical History:  Procedure Laterality Date  . AV FISTULA PLACEMENT Left 03/16/2017   Procedure: INSERTION OF ARTERIOVENOUS (AV) GORE-TEX GRAFT THIGH-LEFT;  Surgeon: Angelia Mould, MD;  Location: [place];  Service: Vascular;  Laterality: Left;  . CHOLECYSTECTOMY    . DIALYSIS FISTULA CREATION     clotted off  . FALSE ANEURYSM REPAIR Left 05/10/2017   Procedure: REPAIR FALSE ANEURYSM left thigh AVGG using Gore 62mx5cm Viabahn Endoprosthesis;  Surgeon: BSerafina Mitchell MD;  [facility];  Service: Vascular;   Laterality: Left;  . I & D EXTREMITY Left 07/14/2017   Procedure: IRRIGATION AND DEBRIDEMENT DISTAL THIGH ABSCESS;  Surgeon: DAngelia Mould MD;  [place] [facility]  Service: Vascular;  Laterality: Left;  . INSERTION OF DIALYSIS CATHETER Right 03/16/2017   Procedure: INSERTION OF RIGHT FEMORAL TUNNELED DIALYSIS CATHETER;  Surgeon: DAngelia Mould MD;  [place] [facility]  Service: Vascular;  Laterality: Right;  . IR AV DIALY [place]O [place]PT[place]/IMG LEFT  11/16/2017  . IR DIALY SHUNT INTRO NEEDLE/INTRACATH INITIAL W/IMG LEFT Left 01/10/2019  . IR THROMBECTOMY AV FISTULA W/THROMBOLYSIS/PTA INC/SHUNT/IMG LEFT Left 06/22/2017  . IR THROMBECTOMY AV FISTULA W/THROMBOLYSIS/PTA/STENT INC/SHUNT/IMG LT Left 03/30/2018  . IR [place] V[place] ACCESS LEFT  06/22/2017  . IR [place] VASC ACCESS LEFT  03/30/2018  . IVC FILTER INSERTION    . PORTACATH PLACEMENT    . THROMBECTOMY AND REVISION OF ARTERIOVENTOUS (AV) GORETEX  GRAFT Left 06/02/2017   Procedure: EVACUATION OF LEFT THIGH HEMATOMA  AND APPLICATION OF WOUND VAC;  Surgeon: BSerafina Mitchell MD;  [facility];  Service: Vascular;  Laterality: Left;   Family History  Family History  Problem Relation Age of Onset  . Breast cancer Mother   . Diabetes Sister   . Diabetes Brother   . CAD Other   .  Stroke Neg Hx    Social History  reports that she has never smoked. She has never used smokeless tobacco. She reports that she does not drink alcohol and does not use drugs. Allergies No Known Allergies Home medications Prior to Admission medications   Medication Sig Start Date End Date Taking? Authorizing Provider  ACCU-CHEK AVIVA PLUS test strip 1 each 3 (three) times daily. 04/15/20   [provider]  albuterol (PROVENTIL) (2.5 MG/3ML) 0.083% nebulizer solution Take 3 mLs (2.5 mg total) by nebulization every 4 (four) hours as needed for wheezing or shortness of breath. Diagnosis COPD J44.9 12/20/18   Collene Gobble, MD   albuterol (VENTOLIN HFA) 108 (90 Base) MCG/ACT inhaler Inhale 2 puffs into the lungs every 4 (four) hours as needed for wheezing or shortness of breath.     [provider]  Amino Acids-Protein Hydrolys (FEEDING SUPPLEMENT, PRO-STAT SUGAR FREE 64,) LIQD Take 30 mLs by mouth 2 (two) times daily. 04/12/17   Sheikh, Omair Latif, DO  amLODipine (NORVASC) 5 MG tablet Take 5 mg by mouth at bedtime. Patient not taking: Reported on 10/15/2020 03/11/20   [provider]  BD PEN NEEDLE NANO U/F 32G X 4 MM MISC 3 (three) times daily.  01/18/19   [provider]  calcitRIOL (ROCALTROL) 0.5 MCG capsule Take 0.5 mcg by mouth daily.    [provider]  carvedilol (COREG) 3.125 MG tablet Take 3.125 mg by mouth 2 (two) times daily with a meal.  Patient not taking: Reported on 10/15/2020 04/16/18   [provider]  cinacalcet (SENSIPAR) 30 MG tablet Take 30 mg by mouth 3 (three) times a week.  02/11/19   [provider]  Darbepoetin Alfa (ARANESP, ALBUMIN FREE,) 40 MCG/ML SOLN Aranesp (in polysorbate) 40 mcg/mL 03/28/20   [provider]  docusate sodium (COLACE) 100 MG capsule Take 1 capsule (100 mg total) by mouth 2 (two) times daily. 04/12/17   Raiford Noble Latif, DO  famotidine (PEPCID) 20 MG tablet Take 1 tablet (20 mg total) daily by mouth. 04/28/17   Lavina Hamman, MD  gabapentin (NEURONTIN) 100 MG capsule Take 100 mg by mouth at bedtime.  01/19/19   [provider]  HUMULIN R 100 UNIT/ML injection  04/20/20   [provider]  HYDROcodone-acetaminophen (NORCO/VICODIN) 5-325 MG tablet Take 1 tablet by mouth 3 (three) times daily as needed. Patient not taking: Reported on 10/15/2020 04/08/20   [provider]  lidocaine-prilocaine (EMLA) cream Apply 1 application topically as directed.  10/10/18   [provider]  midodrine (PROAMATINE) 10 MG tablet Take 1 tablet (10 mg total) by mouth every Monday, Wednesday, and Friday  with hemodialysis. 04/14/17   Raiford Noble Latif, DO  multivitamin (RENA-VIT) TABS tablet Take 1 tablet by mouth at bedtime. 04/12/17   Raiford Noble Latif, DO  Oxycodone HCl 10 MG TABS Take 10 mg by mouth 3 (three) times daily as needed (pain).    [provider]  pantoprazole (PROTONIX) 40 MG tablet Take 1 tablet (40 mg total) 2 (two) times daily before a meal by mouth. 04/28/17   Lavina Hamman, MD  polyethylene glycol (MIRALAX / GLYCOLAX) 17 g packet Take 17 g by mouth daily. Patient not taking: Reported on 10/15/2020 03/19/20   Milton Ferguson, MD  sertraline (ZOLOFT) 50 MG tablet Take 1 tablet (50 mg total) by mouth daily. 03/26/20   Benay Pike, MD  sevelamer carbonate (RENVELA) 800 MG tablet Take 800 mg by mouth  3 (three) times daily with meals.    [provider]  simvastatin (ZOCOR) 40 MG tablet Take 40 mg by mouth daily at 6 PM.    [provider]  sucralfate (CARAFATE) 1 GM/10ML suspension Take 10 mLs (1 g total) 4 (four) times daily -  with meals and at bedtime by mouth. Patient not taking: Reported on 10/15/2020 04/28/17   Lavina Hamman, MD  thiamine (VITAMIN B-1) 100 MG tablet Take 1 tablet (100 mg total) by mouth daily. Patient not taking: Reported on 10/15/2020 06/09/17   Dessa Phi, DO  warfarin (COUMADIN) 5 MG tablet Take 5 mg by mouth daily.     [provider]  ZTLIDO 1.8 % PTCH Place 1 patch onto the skin every other day. 02/28/20   [provider]     Vitals:   10/23/20 [id number] 10/23/20 0223 10/23/20 0417 10/23/20 0747  BP: (!) 124/39 (!) 126/54 (!) 114/34 (!) 141/43  Pulse:  81 73 73  Resp:  '16 15 16  '$ Temp:  98.7 F (37.1 C) 99.1 F (37.3 C) 98.2 F (36.8 C)  TempSrc:  Oral Oral Oral  SpO2:  99% 98% 100%  Weight:  87 kg     Exam Gen alert, no distress No rash, cyanosis or gangrene Sclera anicteric, throat clear  No jvd or bruits Chest clear bilat to bases, no rales/ wheezing RRR no MRG Abd soft ntnd no mass or  ascites +bs GU defer MS no joint effusions or deformity Ext no LE or UE edema, no wounds or ulcers Neuro is alert, Ox 3 , nf       Home meds:  - norvasc 5/ coreg 3.125 bid/ midodrine 10 tiw prehd  - warfarin 5 qd  - carafate 1gm qid/ protonix 40 bid/ pepcid qd  - sensipar 30 tiw/ renvela 800 ac tid  - zocor 40 qd  - zoloft 50 qd/ oxy IR 10 tid prn/ norco qid prn/ neurontin 100 hs  - prn's/ vitamins/ supplements      OP HD: MWF [facility]   4h  4/00/500  90.5kg  3K/2.5 bath Hep none L thigh AVG  - mircera 100 q2 last 4/29, due 5/13  - calcitriol 1 ug po tiw     Na 136  K 3.6  BUN 9  Cr 3.6  Ca 8.3  Hb  6.8     INR 3.2      BP 140 /45  HR 75  RR 15  afeb  100% RA    Assessment/ Plan: 1. AV graft eschar - w/ recent significant bleeding episode at home.  Seen by VVS, plan is for AVG revision, rescheduled for Monday d/t Jeannie Done.  2. Hyperkalemia - resolved w/ HD last night 3. ESRD - on HD MWF. Got HD overnight last night, labs and volume are good. Next HD Monday.  4. BP/ volume - BP's low normal, home norvasc/ coreg on hold. Can get pre HD midodrine if needed.  5. Anemia ckd - Hb 6.8, to get 1u prbc today. Next esa due on 5/13.  6. MBD ckd - cont vdra, binder, sensipar 30 tiw 7. DM2 - per pmd 8. Prot C def/ hx of DVT and PE - per pmd, correcting INR and holding coumadin.       [name]  MD 10/23/2020, 11:18 AM  Recent Labs  Lab 10/22/20 1259 10/23/20 0611  WBC 6.8 5.8  HGB 7.8* 6.8*   Recent Labs  Lab 10/22/20 1259 10/23/20 [id number]  K 5.5* 3.6  BUN 27* 9  CREATININE 7.38* 3.67*  CALCIUM 8.6* 8.3*  PHOS  --  3.2

## 2020-10-23 NOTE — Progress Notes (Signed)
  Date: 10/23/2020  Patient name: Ashlee Miles  Medical record number: [id number]  Date of birth: May 18, 1941   I have seen and evaluated Ashlee Miles and discussed their care with the Residency Team. Briefly, Ms. Hardwicke is a 80 year old woman with PMH of HTN, ESRD on HD, DM2, GERD, protein C deficiency on coumadin who presented with ulceration over left thigh graft.  She was noted to need revision of the graft by vascular surgery.  She was noted to have a supratherapeutic INR which was not low enough to proceed with surgery today.  She will have surgery with vascular surgery on Monday.  Nephrology was also consulted for her care.    Vitals:   10/23/20 1626 10/23/20 1726  BP: (!) 153/61 (!) 175/61  Pulse: 73 74  Resp: 19 15  Temp: 99 F (37.2 C) 98.6 F (37 C)  SpO2: 99% 100%   Gen: Sitting up in bed, fatigued Eyes: Anicteric sclerae CV: + systolic murmur, RR, normal rhythm, no peripheral edema, vascular graft on left thigh, well bandaged, eschar overlying, + thrill Pulm: Breathing easily on room air, no wheezing Abd: soft, ND, NT Skin: Warm, dry, no wound on exposed skin beyond eschar noted on left thigh graft.   Assessment and Plan: I have seen and evaluated the patient as outlined above. I agree with the formulated Assessment and Plan as detailed in the residents' note, with the following changes:   AV Graft ulceration - Pending surgical revision on Monday  Supratherapeutic INR - s/p vitamin K - Start heparin when INR < 2 - Daily INR  Other issues per Dr. Lorelee Cover note.   Sid Falcon, MD 5/6/20227:33 PM

## 2020-10-23 NOTE — Progress Notes (Signed)
[facility]  Subjective  -got dialysis overnight.  Got vitamin K.  INR 5.2 --> 3.2 this morning.  No bleeding from left thigh AV graft.   Objective (!) 114/34 73 99.1 F (37.3 C) (Oral) 15 98%  Intake/Output Summary (Last 24 hours) at 10/23/2020 0650 Last data filed at 10/23/2020 0235 Gross per 24 hour  Intake 50 ml  Output 3322 ml  Net -3272 ml    Left thigh AV graft with lateral ulceration as previously noted, no bleeding  Laboratory Lab Results: Recent Labs    10/22/20 1259  WBC 6.8  HGB 7.8*  HCT 25.1*  PLT 216   BMET Recent Labs    10/22/20 1259  NA 140  K 5.5*  CL 102  CO2 26  GLUCOSE 109*  BUN 27*  CREATININE 7.38*  CALCIUM 8.6*    COAG Lab Results  Component Value Date   INR 3.2 (H) 10/23/2020   INR 5.2 (HH) 10/22/2020   INR 3.4 (H) 10/19/2020   No results found for: PTT  Assessment/Planning:  80 yo F with ESRD seen in the ED last night with ulceration over her left thigh AV graft.  She was admitted to medicine with plans to try and correct her INR and also get her dialyzed given hyperkalemia.  This morning her INR is 5.2 --> 3.2.  It is not safe to proceed to the OR given she is fully therapeutic on Coumadin still.  I have canceled her OR case and moved her to Monday with me.  I updated the patient at bedside.  Marty Heck 10/23/2020 6:50 AM --

## 2020-10-24 DIAGNOSIS — R1013 Epigastric pain: Secondary | ICD-10-CM

## 2020-10-24 LAB — CBC
HCT: 25.3 % — ABNORMAL LOW (ref 36.0–46.0)
Hemoglobin: 8.5 g/dL — ABNORMAL LOW (ref 12.0–15.0)
MCH: 27.2 pg (ref 26.0–34.0)
MCHC: 33.6 g/dL (ref 30.0–36.0)
MCV: 80.8 fL (ref 80.0–100.0)
Platelets: 193 10*3/uL (ref 150–400)
RBC: 3.13 MIL/uL — ABNORMAL LOW (ref 3.87–5.11)
RDW: 16.8 % — ABNORMAL HIGH (ref 11.5–15.5)
WBC: 5.5 10*3/uL (ref 4.0–10.5)
nRBC: 0 % (ref 0.0–0.2)

## 2020-10-24 LAB — HEPATIC FUNCTION PANEL
ALT: 9 U/L (ref 0–44)
AST: 18 U/L (ref 15–41)
Albumin: 2.9 g/dL — ABNORMAL LOW (ref 3.5–5.0)
Alkaline Phosphatase: 44 U/L (ref 38–126)
Bilirubin, Direct: 0.1 mg/dL (ref 0.0–0.2)
Indirect Bilirubin: 0.6 mg/dL (ref 0.3–0.9)
Total Bilirubin: 0.7 mg/dL (ref 0.3–1.2)
Total Protein: 5.6 g/dL — ABNORMAL LOW (ref 6.5–8.1)

## 2020-10-24 LAB — BASIC METABOLIC PANEL
Anion gap: 10 (ref 5–15)
BUN: 16 mg/dL (ref 8–23)
CO2: 29 mmol/L (ref 22–32)
Calcium: 8.2 mg/dL — ABNORMAL LOW (ref 8.9–10.3)
Chloride: 98 mmol/L (ref 98–111)
Creatinine, Ser: 5.46 mg/dL — ABNORMAL HIGH (ref 0.44–1.00)
GFR, Estimated: 7 mL/min — ABNORMAL LOW (ref >60)
Glucose, Bld: 94 mg/dL (ref 70–99)
Potassium: 3.9 mmol/L (ref 3.5–5.1)
Sodium: 137 mmol/L (ref 135–145)

## 2020-10-24 LAB — HEPARIN LEVEL (UNFRACTIONATED): Heparin Unfractionated: 0.4 IU/mL (ref 0.30–0.70)

## 2020-10-24 LAB — GLUCOSE, CAPILLARY
Glucose-Capillary: 122 mg/dL — ABNORMAL HIGH (ref 70–99)
Glucose-Capillary: 134 mg/dL — ABNORMAL HIGH (ref 70–99)
Glucose-Capillary: 173 mg/dL — ABNORMAL HIGH (ref 70–99)

## 2020-10-24 LAB — PROTIME-INR
INR: 1.6 — ABNORMAL HIGH (ref 0.8–1.2)
Prothrombin Time: 18.8 seconds — ABNORMAL HIGH (ref 11.4–15.2)

## 2020-10-24 LAB — TROPONIN I (HIGH SENSITIVITY): Troponin I (High Sensitivity): 32 ng/L — ABNORMAL HIGH (ref <18)

## 2020-10-24 LAB — LIPASE, BLOOD: Lipase: 24 U/L (ref 11–51)

## 2020-10-24 MED ORDER — HEPARIN (PORCINE) 25000 UT/250ML-% IV SOLN
1200.0000 [IU]/h | INTRAVENOUS | Status: DC
  Administered 2020-10-24: 1200 [IU]/h via INTRAVENOUS
  Filled 2020-10-24: qty 250

## 2020-10-24 MED ORDER — INSULIN ASPART 100 UNIT/ML IJ SOLN
0.0000 [IU] | Freq: Three times a day (TID) | INTRAMUSCULAR | Status: DC
  Administered 2020-10-25: 3 [IU] via SUBCUTANEOUS
  Administered 2020-10-26 – 2020-11-04 (×4): 1 [IU] via SUBCUTANEOUS

## 2020-10-24 MED ORDER — FAMOTIDINE 20 MG PO TABS
10.0000 mg | ORAL_TABLET | Freq: Every day | ORAL | Status: DC
  Administered 2020-10-24: 10 mg via ORAL
  Filled 2020-10-24 (×2): qty 1

## 2020-10-24 MED ORDER — SUCRALFATE 1 GM/10ML PO SUSP
1.0000 g | Freq: Once | ORAL | Status: AC
  Administered 2020-10-24: 1 g via ORAL
  Filled 2020-10-24: qty 10

## 2020-10-24 NOTE — Progress Notes (Signed)
  [facility] Progress Note   Subjective:  Seen in room - eating breakfast. No overnight CP or dyspnea.  Objective Vitals:   10/23/20 2333 10/24/20 0419 10/24/20 0822 10/24/20 0929  BP: (!) 158/50 (!) 156/53 (!) 151/67 124/77  Pulse: 73 70 70 71  Resp: '20 18 17 16  '$ Temp: 98.8 F (37.1 C) 98.7 F (37.1 C) 98.2 F (36.8 C) 98.3 F (36.8 C)  TempSrc: Oral Oral Oral Oral  SpO2: 100% 100% 100% 94%  Weight:  87 kg     Physical Exam General: Well appearing woman, NAD. Room air. Heart: RRR; no murmur Lungs: CTAB Abdomen: soft Extremities: No LE edema Dialysis Access: L thigh AVG, bandages in place still  Additional Objective Labs: Basic Metabolic Panel: Recent Labs  Lab 10/22/20 1259 10/23/20 0611 10/24/20 0300  NA 140 136 137  K 5.5* 3.6 3.9  CL 102 100 98  CO2 '26 28 29  '$ GLUCOSE 109* 82 94  BUN 27* 9 16  CREATININE 7.38* 3.67* 5.46*  CALCIUM 8.6* 8.3* 8.2*  PHOS  --  3.2  --    Liver Function Tests: Recent Labs  Lab 10/23/20 0611  ALBUMIN 2.8*   CBC: Recent Labs  Lab 10/18/20 2340 10/22/20 1259 10/23/20 0611 10/23/20 2140 10/24/20 0300  WBC 5.8 6.8 5.8  --  5.5  NEUTROABS  --  4.3  --   --   --   HGB 8.4* 7.8* 6.8* 9.0* 8.5*  HCT 26.5* 25.1* 21.8* 27.2* 25.3*  MCV 82.0 84.5 82.6  --  80.8  PLT 177 216 196  --  193   Medications: . heparin     . [START ON 10/26/2020] calcitRIOL  1 mcg Oral Q M,W,F-HD  . Chlorhexidine Gluconate Cloth  6 each Topical Daily  . Chlorhexidine Gluconate Cloth  6 each Topical Q0600  . [START ON 10/26/2020] cinacalcet  30 mg Oral Q M,W,F-HD  . famotidine  10 mg Oral Daily  . gabapentin  100 mg Oral QHS  . pantoprazole  40 mg Oral BID AC  . sertraline  50 mg Oral Daily  . sevelamer carbonate  800 mg Oral TID WC  . simvastatin  40 mg Oral q1800  . sodium chloride flush  10-40 mL Intracatheter Q12H  . sodium chloride flush  3 mL Intravenous Q12H    Dialysis Orders: MWF [facility]   4h  4/00/500  90.5kg  3K/2.5  bath Hep none L thigh AVG  - mircera 100 q2 last 4/29, due 5/13  - calcitriol 1 ug po tiw  Assessment/Plan: 1. AV graft eschar - w/ recent significant bleeding episode at home.  Seen by VVS, plan is for AVG revision, rescheduled for Monday d/t supratherapeutic INR on admit.  2. Hyperkalemia - resolved s/p HD 3. ESRD: Usual MWF schedule, got HD overnight Thur-Fri for hyperK -> resume home schedule with next HD on Monday 5/9.   4. BP/ volume: Low on admit, then up with holding meds - last BP stable, follow. 5. Anemia of ESRD: Hgb 6.8 on admit, now s/p 1U PRBCs on 5/6. ESA due on 5/13.  6. MBD of ESRD: Continue binder, VDRA/sensipar q HD 7. DM2 - per pmd 8. Prot C def/ hx of DVT and PE - per pmd, correcting INR and holding coumadin.    Veneta Penton, PA-C 10/24/2020, 10:32 AM  [employer]

## 2020-10-24 NOTE — Progress Notes (Signed)
Mobility Specialist: Progress Note   10/24/20 1447  Mobility  Activity Ambulated in hall  Level of Assistance Minimal assist, patient does 75% or more  Assistive Device Front wheel walker  Distance Ambulated (ft) 60 ft  Mobility Response Tolerated well  Mobility performed by Mobility specialist  $Mobility charge 1 Mobility   Pre-Mobility: 70 HR, 155/52 BP, 100% SpO2 During Mobility: 75 HR, 98% SpO2 Post-Mobility: 78 HR, 97% SpO2  Pt reluctant but agreeable to ambulate. Pt required on seated break after returning to room before going to the BR. Pt to BR and then back to bed with call bell at her side.   [facility] Latisha Lasch Mobility Specialist Mobility Specialist Phone: [phone]

## 2020-10-24 NOTE — Progress Notes (Signed)
Spoke with Dr. Daryll Drown regarding infusing heparin through patients port. Unable to obtain a PIV at this time. She stated OK to hang heparin in port. Heparin hung as ordered. See MAR. Will monitor patient. Ashlee Miles, Bettina Gavia rN

## 2020-10-24 NOTE — Progress Notes (Signed)
ANTICOAGULATION CONSULT NOTE   Pharmacy Consult for Heparin Indication: h/o DVT/PE  No Known Allergies  Patient Measurements: Weight: 87 kg (191 lb 12.8 oz) Heparin Dosing Weight: 80 kg  Vital Signs: Temp: 97.9 F (36.6 C) (05/07 1956) Temp Source: Oral (05/07 1956) BP: 166/60 (05/07 1956) Pulse Rate: 80 (05/07 1956)  Labs: Recent Labs    10/22/20 1259 10/22/20 1900 10/23/20 0611 10/23/20 1300 10/23/20 2140 10/24/20 0300 10/24/20 0934 10/24/20 1928  HGB 7.8*  --  6.8*  --  9.0* 8.5*  --   --   HCT 25.1*  --  21.8*  --  27.2* 25.3*  --   --   PLT 216  --  196  --   --  193  --   --   APTT  --   --  50*  --   --   --   --   --   LABPROT  --    < > 32.5* 24.0*  --  18.8*  --   --   INR  --    < > 3.2* 2.2*  --  1.6*  --   --   HEPARINUNFRC  --   --   --   --   --   --   --  0.40  CREATININE 7.38*  --  3.67*  --   --  5.46*  --   --   TROPONINIHS  --   --   --   --   --   --  32*  --    < > = values in this interval not displayed.    Estimated Creatinine Clearance: 8.9 mL/min (A) (by C-G formula based on SCr of 5.46 mg/dL (H)).   Medical History: Past Medical History:  Diagnosis Date  . CHF (congestive heart failure) ([place])   . Diabetes mellitus without complication ([place])   . ESRD (end stage renal disease) on dialysis ([place])   . History of pituitary adenoma   . Hypertension   . Protein C deficiency ([place])     Medications:  Scheduled:  . [START ON 10/26/2020] calcitRIOL  1 mcg Oral Q M,W,F-HD  . Chlorhexidine Gluconate Cloth  6 each Topical Daily  . Chlorhexidine Gluconate Cloth  6 each Topical Q0600  . [START ON 10/26/2020] cinacalcet  30 mg Oral Q M,W,F-HD  . famotidine  10 mg Oral Daily  . gabapentin  100 mg Oral QHS  . insulin aspart  0-6 Units Subcutaneous TID WC  . pantoprazole  40 mg Oral BID AC  . sertraline  50 mg Oral Daily  . sevelamer carbonate  800 mg Oral TID WC  . simvastatin  40 mg Oral q1800  . sodium chloride flush  10-40 mL Intracatheter Q12H   . sodium chloride flush  3 mL Intravenous Q12H    Assessment: 80 y.o. female with h/o PE/DVT, Coumadin on hold and INR subtherapeutic, for heparin  Initial heparin level therapeutic on 1200 units/hr  Goal of Therapy:  Heparin level 0.3-0.7 units/ml Monitor platelets by anticoagulation protocol: Yes   Plan:  Continue heparin gtt at 1200 units/hr Daily heparin level, CBC, s/s bleeding F/u OR plans next week for transition back to [place], PharmD Clinical Pharmacist ED Pharmacist Phone # [phone] 10/24/2020 8:31 PM

## 2020-10-24 NOTE — Progress Notes (Signed)
ANTICOAGULATION CONSULT NOTE - Initial Consult  Pharmacy Consult for Heparin Indication: h/o DVT/PE  No Known Allergies  Patient Measurements: Weight: 87 kg (191 lb 12.8 oz) Heparin Dosing Weight: 80 kg  Vital Signs: Temp: 98.7 F (37.1 C) (05/07 0419) Temp Source: Oral (05/07 0419) BP: 156/53 (05/07 0419) Pulse Rate: 70 (05/07 0419)  Labs: Recent Labs    10/22/20 1259 10/22/20 1900 10/23/20 0611 10/23/20 1300 10/23/20 2140 10/24/20 0300  HGB 7.8*  --  6.8*  --  9.0* 8.5*  HCT 25.1*  --  21.8*  --  27.2* 25.3*  PLT 216  --  196  --   --  193  APTT  --   --  50*  --   --   --   LABPROT  --    < > 32.5* 24.0*  --  18.8*  INR  --    < > 3.2* 2.2*  --  1.6*  CREATININE 7.38*  --  3.67*  --   --  5.46*   < > = values in this interval not displayed.    Estimated Creatinine Clearance: 8.9 mL/min (A) (by C-G formula based on SCr of 5.46 mg/dL (H)).   Medical History: Past Medical History:  Diagnosis Date  . CHF (congestive heart failure) ([place])   . Diabetes mellitus without complication ([place])   . ESRD (end stage renal disease) on dialysis ([place])   . History of pituitary adenoma   . Hypertension   . Protein C deficiency ([place])     Medications:  Scheduled:  . [START ON 10/26/2020] calcitRIOL  1 mcg Oral Q M,W,F-HD  . Chlorhexidine Gluconate Cloth  6 each Topical Daily  . Chlorhexidine Gluconate Cloth  6 each Topical Q0600  . [START ON 10/26/2020] cinacalcet  30 mg Oral Q M,W,F-HD  . gabapentin  100 mg Oral QHS  . pantoprazole  40 mg Oral BID AC  . sertraline  50 mg Oral Daily  . sevelamer carbonate  800 mg Oral TID WC  . simvastatin  40 mg Oral q1800  . sodium chloride flush  10-40 mL Intracatheter Q12H  . sodium chloride flush  3 mL Intravenous Q12H    Assessment: 80 y.o. female with h/o PE/DVT, Coumadin on hold and INR subtherapeutic, for heparin Goal of Therapy:  Heparin level 0.3-0.7 units/ml Monitor platelets by anticoagulation protocol: Yes   Plan:  Start  heparin 1200 units/hr Check heparin level in 8 hours.   [name] 10/24/2020,6:27 AM

## 2020-10-24 NOTE — Progress Notes (Signed)
     Subjective: Patient had no events overnight.  She notes upper abdominal pain this morning.  She has not eaten much in the last few days and thinks that might be related. She is overall feeling unwell this morning.   Objective:  Vital signs in last 24 hours: Vitals:   10/24/20 0419 10/24/20 0822 10/24/20 0929 10/24/20 1127  BP: (!) 156/53 (!) 151/67 124/77 (!) 143/57  Pulse: 70 70 71 66  Resp: '18 17 16 14  '$ Temp: 98.7 F (37.1 C) 98.2 F (36.8 C) 98.3 F (36.8 C) 98.4 F (36.9 C)  TempSrc: Oral Oral Oral Oral  SpO2: 100% 100% 94% 100%  Weight: 87 kg      Gen: Sitting in bed, no distress Eyes: Anicteric sclerae HENT: Neck is supple CV: RR, NR, no murmur, no peripheral edema Pulm: Breathing comfortably, no wheezing Abd: + TTP in the epigastrium and mildly in the RUQ and LUQ, no distention, +BS MSK: AV graft in left thigh with eschar and thrill.  Unchanged from yesterday.   Assessment/Plan:  Complication of vascular access for dialysis - Eschar in place, needs revision of left thigh access - Plan for OR Monday - INR down to 1.6 - heparin started  Essential hypertension - BP elevated on non HD days, such as today - Home meds amlodipine, coreg - consider restarting these today or tomorrow.  Would restart slowly given bleeding and hypotension on admission.    Chronic diastolic CHF (congestive heart failure) - Seen on TTE, 2019 - She is on a statin - She has volume managed with dialysis    ESRD on dialysis ([place]) Anemia due to chronic kidney disease - HD yesterday, nephrology following - Hgb < 7 yesterday, received 1 unit PRBC with improvement to 9 - Monitor - Continue calcitriol, cinacalcet, renvela per nephrology    Epigastric pain - Lipase, LFTs were WNL.  Troponin mildly elevated, but flat from previous - Sucralfate and famotidine added - Monitor for improvement.     Diabetes mellitus with end stage renal disease ([place]) - Will add CBG monitoring and sensitive  SSI    Protein C deficiency - Coumadin on hold - INR 1.6 today - Daily INR - Heparin gtt until surgery   Dispo: Anticipated discharge in approximately 3-4 day(s).   Sid Falcon, MD 10/24/2020, 11:32 AM

## 2020-10-25 ENCOUNTER — Encounter (HOSPITAL_COMMUNITY)
Admission: EM | Disposition: A | Discharge status: Skilled Nursing Facility | Payer: Self-pay | Source: Home / Self Care | Attending: Internal Medicine

## 2020-10-25 ENCOUNTER — Inpatient Hospital Stay (HOSPITAL_COMMUNITY): Payer: 59

## 2020-10-25 ENCOUNTER — Encounter (HOSPITAL_COMMUNITY): Payer: Self-pay | Admitting: Internal Medicine

## 2020-10-25 ENCOUNTER — Inpatient Hospital Stay (HOSPITAL_COMMUNITY): Payer: 59 | Admitting: Certified Registered"

## 2020-10-25 DIAGNOSIS — I5032 Chronic diastolic (congestive) heart failure: Secondary | ICD-10-CM

## 2020-10-25 DIAGNOSIS — E1122 Type 2 diabetes mellitus with diabetic chronic kidney disease: Secondary | ICD-10-CM

## 2020-10-25 HISTORY — PX: AV FISTULA PLACEMENT: SHX1204

## 2020-10-25 LAB — PROTIME-INR
INR: 1.3 — ABNORMAL HIGH (ref 0.8–1.2)
Prothrombin Time: 16.3 seconds — ABNORMAL HIGH (ref 11.4–15.2)

## 2020-10-25 LAB — CBC
HCT: 29.1 % — ABNORMAL LOW (ref 36.0–46.0)
Hemoglobin: 9.7 g/dL — ABNORMAL LOW (ref 12.0–15.0)
MCH: 26.9 pg (ref 26.0–34.0)
MCHC: 33.3 g/dL (ref 30.0–36.0)
MCV: 80.8 fL (ref 80.0–100.0)
Platelets: 192 10*3/uL (ref 150–400)
RBC: 3.6 MIL/uL — ABNORMAL LOW (ref 3.87–5.11)
RDW: 16.5 % — ABNORMAL HIGH (ref 11.5–15.5)
WBC: 7.5 10*3/uL (ref 4.0–10.5)
nRBC: 0 % (ref 0.0–0.2)

## 2020-10-25 LAB — GLUCOSE, CAPILLARY
Glucose-Capillary: 178 mg/dL — ABNORMAL HIGH (ref 70–99)
Glucose-Capillary: 177 mg/dL — ABNORMAL HIGH (ref 70–99)
Glucose-Capillary: 296 mg/dL — ABNORMAL HIGH (ref 70–99)
Glucose-Capillary: 267 mg/dL — ABNORMAL HIGH (ref 70–99)

## 2020-10-25 SURGERY — ARTERIOVENOUS (AV) FISTULA CREATION
Anesthesia: General | Site: Thigh | Laterality: Left

## 2020-10-25 MED ORDER — SUGAMMADEX SODIUM 200 MG/2ML IV SOLN
INTRAVENOUS | Status: DC | PRN
  Administered 2020-10-25: 200 mg via INTRAVENOUS

## 2020-10-25 MED ORDER — ALUM & MAG HYDROXIDE-SIMETH 200-200-20 MG/5ML PO SUSP
30.0000 mL | Freq: Once | ORAL | Status: AC
  Administered 2020-10-25: 30 mL via ORAL
  Filled 2020-10-25: qty 30

## 2020-10-25 MED ORDER — 0.9 % SODIUM CHLORIDE (POUR BTL) OPTIME
TOPICAL | Status: DC | PRN
  Administered 2020-10-25: 2000 mL

## 2020-10-25 MED ORDER — OXYCODONE HCL 5 MG/5ML PO SOLN: 5.0000 mg | Freq: Once | ORAL | Status: DC | PRN

## 2020-10-25 MED ORDER — HYDRALAZINE HCL 20 MG/ML IJ SOLN
10.0000 mg | Freq: Once | INTRAMUSCULAR | Status: AC
  Administered 2020-10-25: 10 mg via INTRAVENOUS
  Filled 2020-10-25: qty 1

## 2020-10-25 MED ORDER — ACETAMINOPHEN 10 MG/ML IV SOLN: 1000.0000 mg | Freq: Once | INTRAVENOUS | Status: DC | PRN

## 2020-10-25 MED ORDER — PROTAMINE SULFATE 10 MG/ML IV SOLN
INTRAVENOUS | Status: DC | PRN
  Administered 2020-10-25: 30 mg via INTRAVENOUS

## 2020-10-25 MED ORDER — FENTANYL CITRATE (PF) 250 MCG/5ML IJ SOLN
INTRAMUSCULAR | Status: AC
  Filled 2020-10-25: qty 5

## 2020-10-25 MED ORDER — PROPOFOL 10 MG/ML IV BOLUS
INTRAVENOUS | Status: DC | PRN
  Administered 2020-10-25: 80 mg via INTRAVENOUS
  Administered 2020-10-25: 20 mg via INTRAVENOUS

## 2020-10-25 MED ORDER — PHENYLEPHRINE HCL (PRESSORS) 10 MG/ML IV SOLN
INTRAVENOUS | Status: DC | PRN
  Administered 2020-10-25: 40 ug via INTRAVENOUS

## 2020-10-25 MED ORDER — HEPARIN SODIUM (PORCINE) 1000 UNIT/ML IJ SOLN
INTRAMUSCULAR | Status: DC | PRN
  Administered 2020-10-25: 8000 [IU] via INTRAVENOUS

## 2020-10-25 MED ORDER — FENTANYL CITRATE (PF) 100 MCG/2ML IJ SOLN
25.0000 ug | INTRAMUSCULAR | Status: DC | PRN
  Administered 2020-10-25 (×2): 25 ug via INTRAVENOUS

## 2020-10-25 MED ORDER — HYDROMORPHONE HCL 1 MG/ML IJ SOLN
0.5000 mg | Freq: Three times a day (TID) | INTRAMUSCULAR | Status: DC | PRN
  Administered 2020-10-25 – 2020-10-27 (×4): 0.5 mg via INTRAVENOUS
  Filled 2020-10-25 (×5): qty 1

## 2020-10-25 MED ORDER — SODIUM CHLORIDE 0.9 % IV SOLN
INTRAVENOUS | Status: DC | PRN
  Administered 2020-10-25: 500 mL

## 2020-10-25 MED ORDER — ROCURONIUM 10MG/ML (10ML) SYRINGE FOR MEDFUSION PUMP - OPTIME
INTRAVENOUS | Status: DC | PRN
  Administered 2020-10-25: 40 mg via INTRAVENOUS

## 2020-10-25 MED ORDER — HEPARIN (PORCINE) 25000 UT/250ML-% IV SOLN
1050.0000 [IU]/h | INTRAVENOUS | Status: DC
  Administered 2020-10-25: 1200 [IU]/h via INTRAVENOUS
  Administered 2020-10-26: 1050 [IU]/h via INTRAVENOUS
  Filled 2020-10-25 (×2): qty 250

## 2020-10-25 MED ORDER — SUCCINYLCHOLINE CHLORIDE 200 MG/10ML IV SOSY
PREFILLED_SYRINGE | INTRAVENOUS | Status: DC | PRN
  Administered 2020-10-25: 120 mg via INTRAVENOUS

## 2020-10-25 MED ORDER — ACETAMINOPHEN 160 MG/5ML PO SOLN: 1000.0000 mg | Freq: Once | ORAL | Status: DC | PRN

## 2020-10-25 MED ORDER — FENTANYL CITRATE (PF) 100 MCG/2ML IJ SOLN
INTRAMUSCULAR | Status: AC
  Filled 2020-10-25: qty 2

## 2020-10-25 MED ORDER — ONDANSETRON HCL 4 MG/2ML IJ SOLN
INTRAMUSCULAR | Status: DC | PRN
  Administered 2020-10-25: 4 mg via INTRAVENOUS

## 2020-10-25 MED ORDER — PHENYLEPHRINE HCL-NACL 10-0.9 MG/250ML-% IV SOLN
INTRAVENOUS | Status: DC | PRN
  Administered 2020-10-25: 50 ug/min via INTRAVENOUS

## 2020-10-25 MED ORDER — OXYCODONE HCL 5 MG PO TABS
5.0000 mg | ORAL_TABLET | Freq: Once | ORAL | Status: DC | PRN
Start: 2020-10-25 — End: 2020-10-25

## 2020-10-25 MED ORDER — CEFAZOLIN SODIUM-DEXTROSE 2-3 GM-%(50ML) IV SOLR
INTRAVENOUS | Status: DC | PRN
  Administered 2020-10-25: 2 g via INTRAVENOUS

## 2020-10-25 MED ORDER — ACETAMINOPHEN 500 MG PO TABS: 1000.0000 mg | ORAL_TABLET | Freq: Once | ORAL | Status: DC | PRN

## 2020-10-25 MED ORDER — HEMOSTATIC AGENTS (NO CHARGE) OPTIME
TOPICAL | Status: DC | PRN
  Administered 2020-10-25: 1 via TOPICAL

## 2020-10-25 MED ORDER — CHLORHEXIDINE GLUCONATE 0.12 % MT SOLN
OROMUCOSAL | Status: AC
  Filled 2020-10-25: qty 15

## 2020-10-25 MED ORDER — PROPOFOL 10 MG/ML IV BOLUS
INTRAVENOUS | Status: AC
  Filled 2020-10-25: qty 20

## 2020-10-25 MED ORDER — CEFAZOLIN SODIUM-DEXTROSE 2-4 GM/100ML-% IV SOLN: 2.0000 g | INTRAVENOUS | Status: AC

## 2020-10-25 MED ORDER — ACETAMINOPHEN 10 MG/ML IV SOLN
INTRAVENOUS | Status: AC
  Filled 2020-10-25: qty 100

## 2020-10-25 MED ORDER — SODIUM CHLORIDE 0.9 % IV SOLN: INTRAVENOUS | Status: DC | PRN

## 2020-10-25 MED ORDER — LIDOCAINE 2% (20 MG/ML) 5 ML SYRINGE
INTRAMUSCULAR | Status: DC | PRN
  Administered 2020-10-25: 80 mg via INTRAVENOUS

## 2020-10-25 MED ORDER — FENTANYL CITRATE (PF) 250 MCG/5ML IJ SOLN
INTRAMUSCULAR | Status: DC | PRN
  Administered 2020-10-25: 100 ug via INTRAVENOUS

## 2020-10-25 MED ORDER — SODIUM CHLORIDE 0.9 % IV SOLN
INTRAVENOUS | Status: AC
  Filled 2020-10-25: qty 1.2

## 2020-10-25 MED ORDER — FAMOTIDINE 20 MG PO TABS
20.0000 mg | ORAL_TABLET | Freq: Every day | ORAL | Status: DC
  Administered 2020-10-25 – 2020-11-05 (×12): 20 mg via ORAL
  Filled 2020-10-25 (×12): qty 1

## 2020-10-25 SURGICAL SUPPLY — 40 items
ARMBAND PINK RESTRICT EXTREMIT (MISCELLANEOUS) ×4 IMPLANT
BLADE CLIPPER SURG (BLADE) ×2 IMPLANT
CANISTER SUCT 3000ML PPV (MISCELLANEOUS) ×2 IMPLANT
CATH EMB 4FR 40CM (CATHETERS) ×2 IMPLANT
CLIP VESOCCLUDE MED 6/CT (CLIP) ×2 IMPLANT
CLIP VESOCCLUDE SM WIDE 6/CT (CLIP) ×2 IMPLANT
COVER PROBE W GEL 5X96 (DRAPES) ×2 IMPLANT
COVER WAND RF STERILE (DRAPES) ×2 IMPLANT
DECANTER SPIKE VIAL GLASS SM (MISCELLANEOUS) ×2 IMPLANT
DERMABOND ADHESIVE PROPEN (GAUZE/BANDAGES/DRESSINGS) ×1
DERMABOND ADVANCED (GAUZE/BANDAGES/DRESSINGS) ×1
DERMABOND ADVANCED .7 DNX12 (GAUZE/BANDAGES/DRESSINGS) ×1 IMPLANT
DERMABOND ADVANCED .7 DNX6 (GAUZE/BANDAGES/DRESSINGS) ×1 IMPLANT
ELECT REM PT RETURN 9FT ADLT (ELECTROSURGICAL) ×2
ELECTRODE REM PT RTRN 9FT ADLT (ELECTROSURGICAL) ×1 IMPLANT
GLOVE BIO SURGEON STRL SZ7.5 (GLOVE) ×2 IMPLANT
GLOVE SRG 8 PF TXTR STRL LF DI (GLOVE) ×1 IMPLANT
GLOVE SURG UNDER POLY LF SZ8 (GLOVE) ×1
GOWN STRL REUS W/ TWL LRG LVL3 (GOWN DISPOSABLE) ×2 IMPLANT
GOWN STRL REUS W/ TWL XL LVL3 (GOWN DISPOSABLE) ×2 IMPLANT
GOWN STRL REUS W/TWL LRG LVL3 (GOWN DISPOSABLE) ×2
GOWN STRL REUS W/TWL XL LVL3 (GOWN DISPOSABLE) ×2
GRAFT PROPATEN THIN WALL 6X40 (Vascular Products) ×2 IMPLANT
HEMOSTAT SNOW SURGICEL 2X4 (HEMOSTASIS) ×2 IMPLANT
HEMOSTAT SPONGE AVITENE ULTRA (HEMOSTASIS) IMPLANT
INSERT FOGARTY SM (MISCELLANEOUS) ×2 IMPLANT
KIT BASIN OR (CUSTOM PROCEDURE TRAY) ×2 IMPLANT
KIT TURNOVER KIT B (KITS) ×2 IMPLANT
MARKER SKIN DUAL TIP RULER LAB (MISCELLANEOUS) ×2 IMPLANT
NS IRRIG 1000ML POUR BTL (IV SOLUTION) ×2 IMPLANT
PACK CV ACCESS (CUSTOM PROCEDURE TRAY) ×2 IMPLANT
PAD ARMBOARD 7.5X6 YLW CONV (MISCELLANEOUS) ×4 IMPLANT
SUT MNCRL AB 4-0 PS2 18 (SUTURE) ×2 IMPLANT
SUT PROLENE 6 0 BV (SUTURE) ×8 IMPLANT
SUT PROLENE 7 0 BV 1 (SUTURE) IMPLANT
SUT VIC AB 3-0 SH 27 (SUTURE) ×2
SUT VIC AB 3-0 SH 27X BRD (SUTURE) ×2 IMPLANT
TOWEL GREEN STERILE (TOWEL DISPOSABLE) ×2 IMPLANT
UNDERPAD 30X36 HEAVY ABSORB (UNDERPADS AND DIAPERS) ×2 IMPLANT
WATER STERILE IRR 1000ML POUR (IV SOLUTION) ×2 IMPLANT

## 2020-10-25 NOTE — Progress Notes (Addendum)
0900 upon attempting to assist patient out of bed to use rest room patient left thigh graft began to spray blood. Pressure immediately held and PA made aware, assistance arrived at bedside within 1 min.    0904 Heparin Stopped 0920 Pressure being held by [place] PA at this time. Vital signs obtained and monitored during this time.  Hydralizine ordered and given.     Patient transported to short stay/OR with nursing staff and Leontine Locket PAC holding pressure.  report given to short stay staff at bedside in [facility].Samantha  PAC still at bedside holding pressure to graft site.   Sarafina Puthoff, Bettina Gavia RN

## 2020-10-25 NOTE — Progress Notes (Addendum)
Called to bedside for bleeding AVG.    Pressure held and pt remained stable throughout.  IV hydralazine was given for hypertension.    Pt taken emergently to OR for left thigh AVG revision by Dr. Carlis Abbott this morning.   Ashlee Miles, [facility] 10/25/2020 10:03 AM

## 2020-10-25 NOTE — Progress Notes (Signed)
ANTICOAGULATION CONSULT NOTE   Pharmacy Consult for Heparin Indication: h/o DVT/PE  No Known Allergies  Patient Measurements: Weight: 87 kg (191 lb 12.8 oz) Heparin Dosing Weight: 80 kg  Vital Signs: Temp: 98 F (36.7 C) (05/08 0300) Temp Source: Oral (05/08 0300) BP: 100/73 (05/08 0300) Pulse Rate: 73 (05/08 0300)  Labs: Recent Labs    10/22/20 1259 10/22/20 1900 10/23/20 0611 10/23/20 1300 10/23/20 2140 10/24/20 0300 10/24/20 0934 10/24/20 1928  HGB 7.8*  --  6.8*  --  9.0* 8.5*  --   --   HCT 25.1*  --  21.8*  --  27.2* 25.3*  --   --   PLT 216  --  196  --   --  193  --   --   APTT  --   --  50*  --   --   --   --   --   LABPROT  --    < > 32.5* 24.0*  --  18.8*  --   --   INR  --    < > 3.2* 2.2*  --  1.6*  --   --   HEPARINUNFRC  --   --   --   --   --   --   --  0.40  CREATININE 7.38*  --  3.67*  --   --  5.46*  --   --   TROPONINIHS  --   --   --   --   --   --  32*  --    < > = values in this interval not displayed.    Estimated Creatinine Clearance: 8.9 mL/min (A) (by C-G formula based on SCr of 5.46 mg/dL (H)).   Medical History: Past Medical History:  Diagnosis Date  . CHF (congestive heart failure) ([place])   . Diabetes mellitus without complication ([place])   . ESRD (end stage renal disease) on dialysis ([place])   . History of pituitary adenoma   . Hypertension   . Protein C deficiency ([place])     Medications:  Scheduled:  . [START ON 10/26/2020] calcitRIOL  1 mcg Oral Q M,W,F-HD  . Chlorhexidine Gluconate Cloth  6 each Topical Daily  . Chlorhexidine Gluconate Cloth  6 each Topical Q0600  . [START ON 10/26/2020] cinacalcet  30 mg Oral Q M,W,F-HD  . famotidine  10 mg Oral Daily  . gabapentin  100 mg Oral QHS  . insulin aspart  0-6 Units Subcutaneous TID WC  . pantoprazole  40 mg Oral BID AC  . sertraline  50 mg Oral Daily  . sevelamer carbonate  800 mg Oral TID WC  . simvastatin  40 mg Oral q1800  . sodium chloride flush  10-40 mL Intracatheter Q12H   . sodium chloride flush  3 mL Intravenous Q12H    Assessment: 80 y.o. female with h/o PE/DVT, Coumadin on hold and INR subtherapeutic, for heparin.   Patient previously therapeutic x1 on heparin 1200 units/hr, no bolus. Confirmatory heparin level unable to be obtained this morning due to access issues and bleeding from AVG. Heparin stopped and pt taken to OR for AVG revision. Pharmacy has been consulted to resume heparin without bolus at 2000.  Goal of Therapy:  Heparin level 0.3-0.7 units/ml Monitor platelets by anticoagulation protocol: Yes   Plan:  Resume heparin 1200 units/hr at 2000 8 hour heparin level, monitor for s/s bleeding F/U transition back to oral [facility]  Romilda Garret, PharmD PGY1 [place]  Resident 10/25/2020 8:09 AM  Please check [email or web address] for unit specific pharmacy phone numbers.

## 2020-10-25 NOTE — Anesthesia Postprocedure Evaluation (Signed)
Anesthesia Post Note  Patient: Rogan Noa  Procedure(s) Performed: ARTERIOVENOUS (AV) FISTULA REVISION LEFT THIGH GRAFT WITH INTERPOSITION GRAFT WITH THROMBECTOMY (Left Thigh)     Patient location during evaluation: PACU Anesthesia Type: General Level of consciousness: awake and alert Pain management: pain level controlled Vital Signs Assessment: post-procedure vital signs reviewed and stable Respiratory status: spontaneous breathing, nonlabored ventilation, respiratory function stable and patient connected to nasal cannula oxygen Cardiovascular status: blood pressure returned to baseline and stable Postop Assessment: no apparent nausea or vomiting Anesthetic complications: no   No complications documented.  Last Vitals:  Vitals:   10/25/20 1614 10/25/20 1618  BP: (!) 144/54   Pulse: 77   Resp: 13   Temp:  36.6 C  SpO2: 99%     Last Pain:  Vitals:   10/25/20 1618  TempSrc: Oral  PainSc:                  Shamar Engelmann

## 2020-10-25 NOTE — Progress Notes (Signed)
[facility] Progress Note   Subjective:  Seen in room - eating breakfast. No CP or dyspnea. Per notes, plan is for L thigh AVG revision tomorrow morning.  Objective Vitals:   10/24/20 1645 10/24/20 1956 10/24/20 2300 10/25/20 0300  BP: (!) 143/94 (!) 166/60 (!) 149/48 100/73  Pulse: 74 80 74 73  Resp: '19 18 20 18  '$ Temp: 98.4 F (36.9 C) 97.9 F (36.6 C) 98.3 F (36.8 C) 98 F (36.7 C)  TempSrc: Oral Oral Oral Oral  SpO2: 100% 99% 100% 99%  Weight:       Physical Exam General: Well appearing woman, NAD. Room air. Heart: RRR; no murmur Lungs: CTAB Abdomen: soft Extremities: No LE edema Dialysis Access: L thigh AVG, + bruit. Unable to fully examine.  Additional Objective Labs: Basic Metabolic Panel: Recent Labs  Lab 10/22/20 1259 10/23/20 0611 10/24/20 0300  NA 140 136 137  K 5.5* 3.6 3.9  CL 102 100 98  CO2 '26 28 29  '$ GLUCOSE 109* 82 94  BUN 27* 9 16  CREATININE 7.38* 3.67* 5.46*  CALCIUM 8.6* 8.3* 8.2*  PHOS  --  3.2  --    Liver Function Tests: Recent Labs  Lab 10/23/20 0611 10/24/20 0934  AST  --  18  ALT  --  9  ALKPHOS  --  44  BILITOT  --  0.7  PROT  --  5.6*  ALBUMIN 2.8* 2.9*   Recent Labs  Lab 10/24/20 0934  LIPASE 24   CBC: Recent Labs  Lab 10/18/20 2340 10/22/20 1259 10/23/20 0611 10/23/20 2140 10/24/20 0300  WBC 5.8 6.8 5.8  --  5.5  NEUTROABS  --  4.3  --   --   --   HGB 8.4* 7.8* 6.8* 9.0* 8.5*  HCT 26.5* 25.1* 21.8* 27.2* 25.3*  MCV 82.0 84.5 82.6  --  80.8  PLT 177 216 196  --  193   Medications: . [START ON 10/26/2020]  ceFAZolin (ANCEF) IV    . heparin 1,200 Units/hr (10/25/20 0219)   . [START ON 10/26/2020] calcitRIOL  1 mcg Oral Q M,W,F-HD  . Chlorhexidine Gluconate Cloth  6 each Topical Daily  . Chlorhexidine Gluconate Cloth  6 each Topical Q0600  . [START ON 10/26/2020] cinacalcet  30 mg Oral Q M,W,F-HD  . famotidine  10 mg Oral Daily  . gabapentin  100 mg Oral QHS  . insulin aspart  0-6 Units  Subcutaneous TID WC  . pantoprazole  40 mg Oral BID AC  . sertraline  50 mg Oral Daily  . sevelamer carbonate  800 mg Oral TID WC  . simvastatin  40 mg Oral q1800  . sodium chloride flush  10-40 mL Intracatheter Q12H  . sodium chloride flush  3 mL Intravenous Q12H    Dialysis Orders: MWF [facility] 4h 4/00/500 90.5kg 3K/2.5 bath Hep none L thigh AVG - mircera 100 q2 last 4/29, due 5/13 - calcitriol 1 ug po tiw  Assessment/Plan: 1. AV graft eschar - w/ recent significant bleeding episode at home. Seen by [facility], plan is for AVG revision, rescheduled for Monday d/t supratherapeutic INR on admit.  2. Hyperkalemia - resolved s/p HD 3. ESRD: Usual MWF schedule, got HD overnight Thur-Fri for hyperK -> resume home schedule with next HD on Monday 5/9 - will do after planned surgery.   4. BP/ volume: Low on admit, then up with holding meds - last BP stable, follow. 5. Anemia of ESRD: Hgb 6.8 on admit, s/p  1U PRBCs on 5/6. ESA due on 5/13.  6. MBD of ESRD: Continue binder, VDRA/sensipar q HD 7. DM2 - per pmd 8. Prot C deficiency/Hx DVT/PE: Warfarin on hold, INR 1.6 yesterday.   Veneta Penton, PA-C 10/25/2020, 9:01 AM  [employer]

## 2020-10-25 NOTE — Op Note (Addendum)
Date: Oct 25, 2020  Preoperative diagnosis: Bleeding from left thigh AV graft with overlying ulceration  Postoperative diagnosis: Same  Procedure: 1.  Thrombectomy of left thigh AV graft 2.  Revision of left thigh AV graft with 6 mm Gore-Tex interposition bypass around ulcerated segment of graft  Surgeon: Dr. Marty Heck, MD  Assistant: Leontine Locket, PA  Indications: Patient is a 80 year old female with end-stage renal disease who is currently dialyzing via a left thigh AV loop graft.  She was seen in the ED on Thursday night with ulceration over her thigh graft and we had recommended revision but this had to be delayed given an INR greater than 5.  She was on the schedule tomorrow morning for AV graft revision with me but had acute bleeding event this morning.  She presents emergently to the OR for AV graft revision and control of bleeding.  An assistant was needed for exposure and to expedite the case.  Findings: Ulceration was over the arterial limb of the left thigh AV loop graft.  I made two counterincisions proximal and distal to the ulceration and ultimately a new 6 mm PTFE limb was tunneled lateral to the ulcerated graft.  I then performed two new end-to-end anastomosis.  I did pass a #4 Fogarty on both the arterial and venous limbs to ensure there was no thrombus given patient had had extensive manual pressure held on the floor.  Good thrill at completion.  Anesthesia: General  Details: Patient was taken emergently to the operating room although I did have a chance to talk to her in preop holding.  Anesthesia was induced while manual pressure was held on the bleeding ulcerated area over the arterial limb on the left lateral part of the thigh.  The left groin and thigh were then prepped and draped in standard sterile fashion to prep the thigh graft into the field.  Timeout was performed.  Antibiotics were given.  At that point in time, I then made two transverse incisions over  the lateral limb of the graft proximal and distal to the ulcerated segment.  I dissected down with Bovie cautery in each incision and got Vesseloops for proximal and distal control.  Patient was then given 8000 units of IV heparin.  I then used Metzenbaum scissors to ensure we had enough of the graft dissected through each of the incisions in order to perform a new anastomosis.  That point in time I then pulled up on the Vesseloops and the ulcerated segment of graft was then disconnected with scissors.  I had to use fistula clamps on both the arterial and venous ends of the graft in order to have better vascular control.  I used a #4 Fogarty and passed this proximally in the arterial limb and had good arterial inflow.  I also passed it distally through the venous limb and got no thrombus with good backbleeding.  That point time I used a curved tunneler and a new subcutaneous tunnel was created from the one incision to the other and I passed a 6 mm PTFE limb through the tunnel.  I performed new end-to-end anastomosis through each of the incisions in order to create a new limb that was tunneled lateral to the existing limb and this disconnected the bleeding segment of graft.  Anastomosis was performed with 6-0 Prolene in parachute technique.  We had good hemostasis and everything was de-aired prior to completion.  Protamine was given for reversal.  I used Surgicel snow for hemostasis.  The incisions were irrigated out and closed with 3-0 Vicryl 4-0 Monocryl and Dermabond.  Taken to holding in stable condition.  Complication: None  Condition: Stable  Marty Heck, MD [facility] Office: [place]

## 2020-10-25 NOTE — Transfer of Care (Signed)
Immediate Anesthesia Transfer of Care Note  Patient: Ashlee Miles  Procedure(s) Performed: ARTERIOVENOUS (AV) FISTULA REVISION LEFT THIGH GRAFT WITH INTERPOSITION GRAFT WITH THROMBECTOMY (Left Thigh)  Patient Location: PACU  Anesthesia Type:General  Level of Consciousness: awake, alert , drowsy, patient cooperative and responds to stimulation  Airway & Oxygen Therapy: Patient Spontanous Breathing and Patient connected to face mask oxygen  Post-op Assessment: Report given to RN, Post -op Vital signs reviewed and stable and Patient moving all extremities X 4  Post vital signs: Reviewed and stable  Last Vitals:  Vitals Value Taken Time  BP 158/55 10/25/20 1142  Temp    Pulse 75 10/25/20 1146  Resp 21 10/25/20 1146  SpO2 100 % 10/25/20 1146  Vitals shown include unvalidated device data.  Last Pain:  Vitals:   10/25/20 0930  TempSrc:   PainSc: 0-No pain         Complications: No complications documented.

## 2020-10-25 NOTE — Anesthesia Procedure Notes (Signed)
Procedure Name: Intubation Date/Time: 10/25/2020 10:08 AM Performed by: Claris Che, CRNA Pre-anesthesia Checklist: Patient identified, Emergency Drugs available, Suction available, Patient being monitored and Timeout performed Patient Re-evaluated:Patient Re-evaluated prior to induction Oxygen Delivery Method: Circle system utilized Preoxygenation: Pre-oxygenation with 100% oxygen Induction Type: IV induction, Rapid sequence and Cricoid Pressure applied Ventilation: Mask ventilation without difficulty Laryngoscope Size: Mac and 4 Grade View: Grade II Tube type: Oral Tube size: 7.5 mm Airway Equipment and Method: Stylet Placement Confirmation: ETT inserted through vocal cords under direct vision,  positive ETCO2 and breath sounds checked- equal and bilateral Secured at: 23 cm Tube secured with: Tape Dental Injury: Teeth and Oropharynx as per pre-operative assessment

## 2020-10-25 NOTE — Anesthesia Preprocedure Evaluation (Signed)
Anesthesia Evaluation  Patient identified by MRN, date of birth, ID band Patient awake    Reviewed: Allergy & Precautions, NPO status , Patient's Chart, lab work & pertinent test results  History of Anesthesia Complications Negative for: history of anesthetic complications  Airway Mallampati: III  TM Distance: >3 FB Neck ROM: Full    Dental  (+) Teeth Intact, Dental Advisory Given   Pulmonary shortness of breath, neg sleep apnea, neg COPD, neg recent URI,  Covid-19 Nucleic Acid Test Results Lab Results      Component                Value               Date                      [place]              NEGATIVE            10/22/2020                [place]              NEGATIVE            03/25/2020                [place]              NEGATIVE            03/22/2020                [place]              NEGATIVE            02/09/2019                [place]              Not Detected        09/06/2018              breath sounds clear to auscultation       Cardiovascular hypertension, Pt. on medications and Pt. on home beta blockers (-) angina+CHF  (-) Past MI  Rhythm:Regular  - Left ventricle: The cavity size was normal. There was mild  concentric hypertrophy. Systolic function was normal. The  estimated ejection fraction was in the range of 60% to 65%. Wall  motion was normal; there were no regional wall motion  abnormalities. Doppler parameters are consistent with abnormal  left ventricular relaxation (grade 1 diastolic dysfunction).  - Aortic valve: Valve area (Vmax): 1.65 cm^2.  - Right atrium: The atrium was mildly to moderately dilated.  - Tricuspid valve: There was mild-moderate regurgitation.  - Pulmonary arteries: Systolic pressure was mildly to moderately  increased. PA peak pressure: 44 mm Hg (S).    Neuro/Psych neg Seizures Depression TIA   GI/Hepatic Neg liver ROS, GERD  Medicated and  Controlled,  Endo/Other  diabetes, Insulin Dependent  Renal/GU ESRF and DialysisRenal diseaseLab Results      Component                Value               Date                      K  3.9                 10/24/2020           HD Friday     Musculoskeletal   Abdominal   Peds  Hematology  (+) Blood dyscrasia, anemia ,   Anesthesia Other Findings   Reproductive/Obstetrics                             Anesthesia Physical Anesthesia Plan  ASA: IV and emergent  Anesthesia Plan: General   Post-op Pain Management:    Induction: Intravenous  PONV Risk Score and Plan: 3 and Ondansetron and Dexamethasone  Airway Management Planned: Oral ETT  Additional Equipment: None  Intra-op Plan:   Post-operative Plan: Extubation in OR and Possible Post-op intubation/ventilation  Informed Consent: I have reviewed the patients History and Physical, chart, labs and discussed the procedure including the risks, benefits and alternatives for the proposed anesthesia with the patient or authorized representative who has indicated his/her understanding and acceptance.     Dental advisory given  Plan Discussed with: CRNA and Surgeon  Anesthesia Plan Comments:         Anesthesia Quick Evaluation

## 2020-10-25 NOTE — Progress Notes (Signed)
Patient arrive back from PACU to 4e18 vital signs obtained and patient placed on monitor. Patient with dressing intact to left thigh graft. Left foot warm and palpable pulses. Will monitor patient.Andersyn Fragoso, Bettina Gavia RN

## 2020-10-25 NOTE — Progress Notes (Addendum)
Subjective: HD # 2 Overnight, no acute events reported.   This morning, Ashlee Ashlee Miles was evaluated at bedside. She is resting comfortably in bed. Ashlee Miles states she's had epigastric pain since she's been here that is bothersome. She says she takes medication at home for this. She says she hasn't eaten too much for breakfast.  Discussed plans for fistula revision tomorrow.  Apparently, around 9 AM patient had an acute bleed from her fistula site for which vascular was notified and patient emergently taken to the OR for repair.  Objective:  Vital signs in last 24 hours: Vitals:   10/25/20 0925 10/25/20 0935 10/25/20 1145 10/25/20 1200  BP: (!) 158/137 137/85 (!) 158/55 (!) 142/47  Pulse: 73 79 75 73  Resp: '17 20 20 19  '$ Temp:   (!) 97.4 F (36.3 C)   TempSrc:      SpO2: 100% 100% 100% 100%  Weight:       CBC Latest Ref Rng & Units 10/24/2020 10/23/2020 10/23/2020  WBC 4.0 - 10.5 K/uL 5.5 - 5.8  Hemoglobin 12.0 - 15.0 g/dL 8.5(L) 9.0(L) 6.8(LL)  Hematocrit 36.0 - 46.0 % 25.3(L) 27.2(L) 21.8(L)  Platelets 150 - 400 K/uL 193 - 196   CMP Latest Ref Rng & Units 10/24/2020 10/23/2020 10/22/2020  Glucose 70 - 99 mg/dL 94 82 109(H)  BUN 8 - 23 mg/dL 16 9 27(H)  Creatinine 0.44 - 1.00 mg/dL 5.46(H) 3.67(H) 7.38(H)  Sodium 135 - 145 mmol/L 137 136 140  Potassium 3.5 - 5.1 mmol/L 3.9 3.6 5.5(H)  Chloride 98 - 111 mmol/L 98 100 102  CO2 22 - 32 mmol/L '29 28 26  '$ Calcium 8.9 - 10.3 mg/dL 8.2(L) 8.3(L) 8.6(L)  Total Protein 6.5 - 8.1 g/dL 5.6(L) - -  Total Bilirubin 0.3 - 1.2 mg/dL 0.7 - -  Alkaline Phos 38 - 126 U/L 44 - -  AST 15 - 41 U/L 18 - -  ALT 0 - 44 U/L 9 - -   Physical Exam  Constitutional: Elderly female, well-developed and well-nourished. No distress.  HENT: Normocephalic and atraumatic, EOMI, moist mucus membranes Cardiovascular: Normal rate, regular rhythm, S1 and S2 present, no murmurs, rubs, gallops.  Distal pulses intact Respiratory: No respiratory distress. Lungs are clear  to auscultation bilaterally. On room air GI: Nondistended, soft, tender to palpation in epigastric region, active bowel sounds Musculoskeletal: Normal bulk and tone.  No peripheral edema noted. Neurological: Is alert and oriented x4, no apparent focal deficits noted. Skin: Warm and dry.  No rash, erythema, lesions noted. AV graft in left thigh with stable eschar and thrill Psychiatric: Normal mood and affect. Behavior is normal. Judgment and thought content normal.   Assessment/Plan:  Principal Problem:   Complication of vascular access for dialysis Active Problems:   Essential hypertension   Chronic diastolic CHF (congestive heart failure) (H[facility])   ESRD on dialysis ([place])   Anemia due to chronic kidney disease   Epigastric pain   Diabetes mellitus with end stage renal disease ([place])   Protein C deficiency [facility])  Ashlee Miles is a 80 year old female with PMHx of ESRD on HD MWF, HFpEF, Type II DM, Protein C defiency and prior DVT on Coumadin admitted with AV graft ulceration.   AV graft ulceration Patient with eschar over her vascular access site on left thigh.  This morning, patient noted to have spurting blood from site of ulceration for which she was emergently taken to the OR for repair.   -Follow  vascular surgery recommendations -Heparin drip on hold til 8pm tonight -Repeat CBC in p.m., transfuse as needed for Hb>7  ESRD on HD MWF  Last HD session on Friday.  No significant electrolyte abnormalities noted on labs today.  Plans for HD session tomorrow.  -HD per nephrology -Monitor renal function daily -Avoid nephrotoxic agents as able  Epigastric pain: Suspect 2/2 GERD. Patient is on protonix and famotidine at home. Protonix was resumed on admission and famotidine '10mg'$  daily added yesterday with sucralfate. She continues to have epigastric pain and tenderness to palpation.  - Continue protonix '40mg'$  bid, increase famotidine to '20mg'$  daily - GI cocktail  Chronic normocytic  anemia  Secondary to chronic kidney disease. She did receive 1u pRBC thusfar for Hb 6.8 on second day of admission. Hb 8.5 this morning; however, did have episode of acute blood loss this morning and suspect that she may need another transfusion. - Repeat CBC in PM, transfuse prn for Hb > 7 - Trend CBC - Monitor for any signs of bleeding  HFpEF  Hypertension Blood pressures have been soft since yesterday; she is on amlodipine and coreg at home. However, in setting of acute blood loss, can hold off on resuming these today to prevent further augmentation of hypotension. Volume status managed with HD which she is scheduled for tomorrow. - Continue to monitor  Type II DM:  - Continue CBG monitoring - Continue SSI (sensitive)  Diet: CM IVF: N/A DVT Prophylaxis: heparin gtt Code status: DNR  Prior to Admission Living Arrangement: Home Anticipated Discharge Location: Home Barriers to Discharge: Continued medical management  Dispo: Anticipated discharge in approximately 1-2 day(s).   Harvie Heck, MD  [facility] PGY-2 10/25/2020, 12:16 PM Pager: [phone] After 5pm on weekdays and 1pm on weekends: On Call pager [phone]

## 2020-10-25 NOTE — Progress Notes (Signed)
Pt pre-op'd for Monday 10/26/2020 for revision of left thigh graft.  Npo/consent/labs ordered.  INR 1.6 yesterday.  Please schedule pt's dialysis later in the day on Monday due to having surgery in the morning.  Thanks.   Leontine Locket, [facility] 10/25/2020 7:30 AM

## 2020-10-25 NOTE — Progress Notes (Signed)
Attending team updated on patient. Made aware of patients request of medication for pain. Will monitor . Willistine Ferrall, Bettina Gavia RN

## 2020-10-26 ENCOUNTER — Encounter (HOSPITAL_COMMUNITY)
Admission: EM | Disposition: A | Discharge status: Skilled Nursing Facility | Payer: Self-pay | Source: Home / Self Care | Attending: Internal Medicine

## 2020-10-26 ENCOUNTER — Encounter (HOSPITAL_COMMUNITY): Payer: Self-pay | Admitting: Vascular Surgery

## 2020-10-26 LAB — TYPE AND SCREEN
ABO/RH(D): B POS
Antibody Screen: NEGATIVE
Unit division: 0
Unit division: 0
Unit division: 0

## 2020-10-26 LAB — PROTIME-INR
INR: 1.4 — ABNORMAL HIGH (ref 0.8–1.2)
Prothrombin Time: 17.4 seconds — ABNORMAL HIGH (ref 11.4–15.2)

## 2020-10-26 LAB — CBC
HCT: 27.6 % — ABNORMAL LOW (ref 36.0–46.0)
Hemoglobin: 8.8 g/dL — ABNORMAL LOW (ref 12.0–15.0)
MCH: 26.3 pg (ref 26.0–34.0)
MCHC: 31.9 g/dL (ref 30.0–36.0)
MCV: 82.4 fL (ref 80.0–100.0)
Platelets: 203 10*3/uL (ref 150–400)
RBC: 3.35 MIL/uL — ABNORMAL LOW (ref 3.87–5.11)
RDW: 16.6 % — ABNORMAL HIGH (ref 11.5–15.5)
WBC: 6.1 10*3/uL (ref 4.0–10.5)
nRBC: 0 % (ref 0.0–0.2)

## 2020-10-26 LAB — BPAM RBC
Blood Product Expiration Date: 202205252359
Blood Product Expiration Date: 202205282359
Blood Product Expiration Date: 202205282359
ISSUE DATE / TIME: 202205061315
Unit Type and Rh: 7300
Unit Type and Rh: 7300
Unit Type and Rh: 7300

## 2020-10-26 LAB — RENAL FUNCTION PANEL
Albumin: 2.7 g/dL — ABNORMAL LOW (ref 3.5–5.0)
Anion gap: 11 (ref 5–15)
BUN: 39 mg/dL — ABNORMAL HIGH (ref 8–23)
CO2: 25 mmol/L (ref 22–32)
Calcium: 7.9 mg/dL — ABNORMAL LOW (ref 8.9–10.3)
Chloride: 102 mmol/L (ref 98–111)
Creatinine, Ser: 8.9 mg/dL — ABNORMAL HIGH (ref 0.44–1.00)
GFR, Estimated: 4 mL/min — ABNORMAL LOW (ref >60)
Glucose, Bld: 161 mg/dL — ABNORMAL HIGH (ref 70–99)
Phosphorus: 5 mg/dL — ABNORMAL HIGH (ref 2.5–4.6)
Potassium: 5.1 mmol/L (ref 3.5–5.1)
Sodium: 138 mmol/L (ref 135–145)

## 2020-10-26 LAB — GLUCOSE, CAPILLARY
Glucose-Capillary: 167 mg/dL — ABNORMAL HIGH (ref 70–99)
Glucose-Capillary: 125 mg/dL — ABNORMAL HIGH (ref 70–99)
Glucose-Capillary: 110 mg/dL — ABNORMAL HIGH (ref 70–99)
Glucose-Capillary: 152 mg/dL — ABNORMAL HIGH (ref 70–99)

## 2020-10-26 LAB — HEPARIN LEVEL (UNFRACTIONATED)
Heparin Unfractionated: 0.65 IU/mL (ref 0.30–0.70)
Heparin Unfractionated: 0.89 IU/mL — ABNORMAL HIGH (ref 0.30–0.70)

## 2020-10-26 SURGERY — REVISON OF ARTERIOVENOUS FISTULA
Anesthesia: Choice | Laterality: Left

## 2020-10-26 MED ORDER — LIDOCAINE-PRILOCAINE 2.5-2.5 % EX CREA
TOPICAL_CREAM | CUTANEOUS | Status: DC
  Filled 2020-10-26 (×2): qty 5

## 2020-10-26 MED ORDER — HYDROMORPHONE HCL 1 MG/ML IJ SOLN
0.5000 mg | Freq: Once | INTRAMUSCULAR | Status: AC
  Administered 2020-10-26: 0.5 mg via INTRAVENOUS
  Filled 2020-10-26: qty 1

## 2020-10-26 MED ORDER — HYDROMORPHONE HCL 1 MG/ML IJ SOLN
INTRAMUSCULAR | Status: AC
  Administered 2020-10-26: 0.5 mg via INTRAVENOUS
  Filled 2020-10-26: qty 0.5

## 2020-10-26 MED ORDER — ALUM & MAG HYDROXIDE-SIMETH 200-200-20 MG/5ML PO SUSP
30.0000 mL | Freq: Once | ORAL | Status: AC
  Administered 2020-10-26: 30 mL via ORAL
  Filled 2020-10-26: qty 30

## 2020-10-26 MED ORDER — CALCITRIOL 0.5 MCG PO CAPS
ORAL_CAPSULE | ORAL | Status: AC
  Administered 2020-10-26: 1 ug via ORAL
  Filled 2020-10-26: qty 2

## 2020-10-26 MED ORDER — WARFARIN SODIUM 2.5 MG PO TABS
2.5000 mg | ORAL_TABLET | Freq: Once | ORAL | Status: AC
  Administered 2020-10-26: 2.5 mg via ORAL
  Filled 2020-10-26: qty 1

## 2020-10-26 MED ORDER — CINACALCET HCL 30 MG PO TABS
ORAL_TABLET | ORAL | Status: AC
  Administered 2020-10-26: 30 mg via ORAL
  Filled 2020-10-26: qty 1

## 2020-10-26 MED ORDER — WARFARIN - PHARMACIST DOSING INPATIENT: Freq: Every day | Status: DC

## 2020-10-26 NOTE — Discharge Instructions (Signed)
[facility]  Discharge Instructions  AV Fistula or Graft Surgery for Dialysis Access  Please refer to the following instructions for your post-procedure care. Your surgeon or physician assistant will discuss any changes with you.  Activity  You may drive the day following your surgery, if you are comfortable and no longer taking prescription pain medication. Resume full activity as the soreness in your incision resolves.  Bathing/Showering  You may shower after you go home. Keep your incision dry for 48 hours. Do not soak in a bathtub, hot tub, or swim until the incision heals completely. You may not shower if you have a hemodialysis catheter.  Incision Care  Clean your incision with mild soap and water after 48 hours. Pat the area dry with a clean towel. You do not need a bandage unless otherwise instructed. Do not apply any ointments or creams to your incision. You may have skin glue on your incision. Do not peel it off. It will come off on its own in about one week. Your arm may swell a bit after surgery. To reduce swelling use pillows to elevate your arm so it is above your heart. Your doctor will tell you if you need to lightly wrap your arm with an ACE bandage.  Diet  Resume your normal diet. There are not special food restrictions following this procedure. In order to heal from your surgery, it is CRITICAL to get adequate nutrition. Your body requires vitamins, minerals, and protein. Vegetables are the best source of vitamins and minerals. Vegetables also provide the perfect balance of protein. Processed food has little nutritional value, so try to avoid this.  Medications  Resume taking all of your medications. If your incision is causing pain, you may take over-the counter pain relievers such as acetaminophen (Tylenol). If you were prescribed a stronger pain medication, please be aware these medications can cause nausea and constipation. Prevent  nausea by taking the medication with a snack or meal. Avoid constipation by drinking plenty of fluids and eating foods with high amount of fiber, such as fruits, vegetables, and grains.  Do not take Tylenol if you are taking prescription pain medications.  Follow up Your surgeon may want to see you in the office following your access surgery. If so, this will be arranged at the time of your surgery.  Please call us immediately for any of the following conditions:  . Increased pain, redness, drainage (pus) from your incision site . Fever of 101 degrees or higher . Severe or worsening pain at your incision site . Hand pain or numbness. .  Reduce your risk of vascular disease:  . Stop smoking. If you would like help, call [facility] at 1-800-QUIT-NOW (267) 361-1103) or [place] at [phone]  . Manage your cholesterol . Maintain a desired weight . Control your diabetes . Keep your blood pressure down  Dialysis  It will take several weeks to several months for your new dialysis access to be ready for use. Your surgeon will determine when it is okay to use it. Your nephrologist will continue to direct your dialysis. You can continue to use your Permcath until your new access is ready for use.   10/26/2020 [name] [id number] 1940/08/30  Surgeon(s): Marty Heck, MD  Procedure(s): ARTERIOVENOUS (AV) FISTULA REVISION LEFT THIGH GRAFT WITH INTERPOSITION GRAFT WITH THROMBECTOMY  x May stick graft on designated area only:  Do NOT stick between incisions for 4 weeks. Stick on the medial portion of  the graft for 4 weeks SEE DIAGRAM.   If you have any questions, please call the office at [phone].  Information on my medicine - Coumadin   (Warfarin)  This medication education was reviewed with me or my healthcare representative as part of my discharge preparation.  You were taking this medication prior to this hospital admission.    Why was Coumadin prescribed  for you? Coumadin was prescribed for you because you have a blood clot or a medical condition that can cause an increased risk of forming blood clots. Blood clots can cause serious health problems by blocking the flow of blood to the heart, lung, or brain. Coumadin can prevent harmful blood clots from forming. As a reminder your indication for Coumadin is:   Blood Clotting Disorder  History of PE, DVT, Protein C deficiency.   What test will check on my response to Coumadin? While on Coumadin (warfarin) you will need to have an INR test regularly to ensure that your dose is keeping you in the desired range. The INR (international normalized ratio) number is calculated from the result of the laboratory test called prothrombin time (PT).  If an INR APPOINTMENT HAS NOT ALREADY BEEN MADE FOR YOU please schedule an appointment to have this lab work done by your health care provider within 7 days. Your INR goal is usually a number between:  2 to 3 or your provider may give you a more narrow range like 2-2.5.  Ask your health care provider during an office visit what your goal INR is.  What  do you need to  know  About  COUMADIN? Take Coumadin (warfarin) exactly as prescribed by your healthcare provider about the same time each day.  DO NOT stop taking without talking to the doctor who prescribed the medication.  Stopping without other blood clot prevention medication to take the place of Coumadin may increase your risk of developing a new clot or stroke.  Get refills before you run out.  What do you do if you miss a dose? If you miss a dose, take it as soon as you remember on the same day then continue your regularly scheduled regimen the next day.  Do not take two doses of Coumadin at the same time.  Important Safety Information A possible side effect of Coumadin (Warfarin) is an increased risk of bleeding. You should call your healthcare provider right away if you experience any of the  following: ? Bleeding from an injury or your nose that does not stop. ? Unusual colored urine (red or dark [place]) or unusual colored stools (red or black). ? Unusual bruising for unknown reasons. ? A serious fall or if you hit your head (even if there is no bleeding).  Some foods or medicines interact with Coumadin (warfarin) and might alter your response to warfarin. To help avoid this: ? Eat a balanced diet, maintaining a consistent amount of Vitamin K. ? Notify your provider about major diet changes you plan to make. ? Avoid alcohol or limit your intake to 1 drink for women and 2 drinks for men per day. (1 drink is 5 oz. wine, 12 oz. beer, or 1.5 oz. liquor.)  Make sure that ANY health care provider who prescribes medication for you knows that you are taking Coumadin (warfarin).  Also make sure the healthcare provider who is monitoring your Coumadin knows when you have started a new medication including herbals and non-prescription products.  Coumadin (Warfarin)  Major Drug Interactions  Increased  Warfarin Effect Decreased Warfarin Effect  Alcohol (large quantities) Antibiotics (esp. Septra/Bactrim, Flagyl, Cipro) Amiodarone (Cordarone) Aspirin (ASA) Cimetidine (Tagamet) Megestrol (Megace) NSAIDs (ibuprofen, naproxen, etc.) Piroxicam (Feldene) Propafenone (Rythmol SR) Propranolol (Inderal) Isoniazid (INH) Posaconazole (Noxafil) Barbiturates (Phenobarbital) Carbamazepine (Tegretol) Chlordiazepoxide (Librium) Cholestyramine (Questran) Griseofulvin Oral Contraceptives Rifampin Sucralfate (Carafate) Vitamin K   Coumadin (Warfarin) Major Herbal Interactions  Increased Warfarin Effect Decreased Warfarin Effect  Garlic Ginseng Ginkgo biloba Coenzyme Q10 Green tea St. John's wort    Coumadin (Warfarin) FOOD Interactions  Eat a consistent number of servings per week of foods HIGH in Vitamin K (1 serving =  cup)  Collards (cooked, or boiled & drained) Kale (cooked, or  boiled & drained) Mustard greens (cooked, or boiled & drained) Parsley *serving size only =  cup Spinach (cooked, or boiled & drained) Swiss chard (cooked, or boiled & drained) Turnip greens (cooked, or boiled & drained)  Eat a consistent number of servings per week of foods MEDIUM-HIGH in Vitamin K (1 serving = 1 cup)  Asparagus (cooked, or boiled & drained) Broccoli (cooked, boiled & drained, or raw & chopped) Brussel sprouts (cooked, or boiled & drained) *serving size only =  cup Lettuce, raw (green leaf, endive, romaine) Spinach, raw Turnip greens, raw & chopped   These websites have more information on Coumadin (warfarin):  FailFactory.se; VeganReport.com.au;

## 2020-10-26 NOTE — Procedures (Signed)
Seen and examined on dialysis.  Left thigh graft is cannulated.  Blood pressure initially [id number] systolic.  Gave bolus 250 mL kept out of UF.  Retook BP with cuff.  117/52.  She feels ok.  UF goal reduced to 0.5 kg as tolerated for now.    Claudia Desanctis, MD 10/26/2020 4:24 PM

## 2020-10-26 NOTE — Progress Notes (Signed)
ANTICOAGULATION CONSULT NOTE   Pharmacy Consult for Heparin Indication: h/o DVT/PE, Protein C deficiency  No Known Allergies  Patient Measurements: Weight: 91.5 kg (201 lb 11.5 oz) Heparin Dosing Weight: 80 kg  Vital Signs: Temp: 97.8 F (36.6 C) (05/09 0752) Temp Source: Oral (05/09 0752) BP: 150/56 (05/09 0751) Pulse Rate: 69 (05/09 0752)  Labs: Recent Labs    10/24/20 0300 10/24/20 0934 10/24/20 1928 10/25/20 1535 10/26/20 0721  HGB 8.5*  --   --  9.7* 8.8*  HCT 25.3*  --   --  29.1* 27.6*  PLT 193  --   --  192 203  LABPROT 18.8*  --   --  16.3* 17.4*  INR 1.6*  --   --  1.3* 1.4*  HEPARINUNFRC  --   --  0.40  --  0.65  CREATININE 5.46*  --   --   --  8.90*  TROPONINIHS  --  32*  --   --   --     Estimated Creatinine Clearance: 5.6 mL/min (A) (by C-G formula based on SCr of 8.9 mg/dL (H)).   Medical History: Past Medical History:  Diagnosis Date  . CHF (congestive heart failure) ([place])   . Diabetes mellitus without complication ([place])   . ESRD (end stage renal disease) on dialysis ([place])   . History of pituitary adenoma   . Hypertension   . Protein C deficiency ([place])     Medications:  Scheduled:  . alum & mag hydroxide-simeth  30 mL Oral Once  . calcitRIOL  1 mcg Oral Q M,W,F-HD  . Chlorhexidine Gluconate Cloth  6 each Topical Daily  . Chlorhexidine Gluconate Cloth  6 each Topical Q0600  . cinacalcet  30 mg Oral Q M,W,F-HD  . famotidine  20 mg Oral Daily  . gabapentin  100 mg Oral QHS  .  HYDROmorphone (DILAUDID) injection  0.5 mg Intravenous Once  . insulin aspart  0-6 Units Subcutaneous TID WC  . lidocaine-prilocaine   Topical QODAY  . pantoprazole  40 mg Oral BID AC  . sertraline  50 mg Oral Daily  . sevelamer carbonate  800 mg Oral TID WC  . simvastatin  40 mg Oral q1800  . sodium chloride flush  10-40 mL Intracatheter Q12H  . sodium chloride flush  3 mL Intravenous Q12H    Assessment: 80 y.o. female on Coumadin prior to admission for  h/o  PE/DVT and protein C deficiency. Supratherapeutic INR at 5.2 on admission 10/22/20.  Coumadin placed on hold and started on IV Heparin.   Heparin drip was stopped 5/8 and patient was taken emergently to OR for bleeding AVG, Ulceration of left thigh AV graft s/p revision 5/8.  Protamine '30mg'$  IV given on 5/8.  Heparin infusion was resumed on 5/8 at 20:00.   HL = 0.65 therapeutic on heparin drip at 1200 ut/hr.  INR 1.4, Hgb stable 8s-9s, pltc within normal limits.  Received blood transfusion on 5/6.   Pharmacy consulted to resume Warfarin tonight 5/9.   PTA Warfarin dose:  Ou patient pharmacy reports last filled dates for warfarin as follows:   Warfarin '2mg'$  LF date 08/19/20 and '5mg'$  LF date 08/06/20.   Have attempted to confirm home medication /dosage with patient's family member and this is still in process.  Last hospitalization note in Epic indicated: Warfarin '5mg'$  daily per discharge summary on 03/26/2020.    Goal of Therapy:  Heparin level 0.3-0.7 units/ml Monitor platelets by anticoagulation protocol: Yes   Plan:  Continue heparin 1200 units/hr. Restart Warfarin tonight, give 2.5 mg po x1  Monitor for s/s bleeding Daily HL, INR and CBC.  Thank you for allowing pharmacy to be part of this patients care team. Nicole Cella, [place] Pharmacist [phone] 10/26/2020 10:09 AM  Please check [email or web address] for unit specific pharmacy phone numbers.

## 2020-10-26 NOTE — Progress Notes (Addendum)
[place] for IV Heparin >> Warfarin Indication: h/o DVT/PE, Protein C deficiency  No Known Allergies  Patient Measurements: Weight: 92 kg (202 lb 13.2 oz) Heparin Dosing Weight: 80 kg  Vital Signs: Temp: 98.2 F (36.8 C) (05/09 1400) Temp Source: Oral (05/09 1400) BP: 73/30 (05/09 1730) Pulse Rate: 68 (05/09 1620)  Labs: Recent Labs    10/24/20 0300 10/24/20 0934 10/24/20 1928 10/25/20 1535 10/26/20 0721 10/26/20 1726  HGB 8.5*  --   --  9.7* 8.8*  --   HCT 25.3*  --   --  29.1* 27.6*  --   PLT 193  --   --  192 203  --   LABPROT 18.8*  --   --  16.3* 17.4*  --   INR 1.6*  --   --  1.3* 1.4*  --   HEPARINUNFRC  --   --  0.40  --  0.65 0.89*  CREATININE 5.46*  --   --   --  8.90*  --   TROPONINIHS  --  32*  --   --   --   --     Estimated Creatinine Clearance: 5.6 mL/min (A) (by C-G formula based on SCr of 8.9 mg/dL (H)).   Medical History: Past Medical History:  Diagnosis Date  . CHF (congestive heart failure) ([place])   . Diabetes mellitus without complication ([place])   . ESRD (end stage renal disease) on dialysis ([place])   . History of pituitary adenoma   . Hypertension   . Protein C deficiency [facility])     Assessment: 80 y.o. female on HD on warfarin PTA for hx PE/DVT and protein C deficiency; supratherapeutic INR at 5.2 on admission 10/22/20.  Warfarin was placed on hold, and pt was started on IV Heparin.   Heparin infusion was stopped 5/8 and patient was taken emergently to OR for bleeding AVG, ulceration of left thigh AV graft, S/P revision 5/8.  Protamine 30 mg IV given on 5/8.   Heparin infusion was resumed on 5/8 at 2000 PM; heparin level was therapeutic (0.64 units/ml.) on heparin infusion at 1200 units/hr earlier today. INR 1.4, Hgb stable 8s-9s, plt WNL. Pt rec'd blood transfusion on 5/6.   Pharmacy was consulted to resume warfarin tonight, 5/9.  PTA Warfarin dose:  Outpatient pharmacy reports last filled (LF) dates for  warfarin as follows:  Warfarin 2 mg LF date 08/19/20 and 5 mg LF date 08/06/20 (have attempted to confirm home medication /dosage with patient's family member and this is still in process).  Last hospitalization note in Epic indicated: Warfarin 5 mg daily per discharge summary on 03/26/2020.   Confirmatory heparin level on heparin infusion at 1200 units/hr was 0.89 units/ml this afternoon (drawn ~10 hrs after earlier therapeutic level today), which is above the goal range for this pt. Per HD RN, no issues with IV or bleeding observed.  Goal of Therapy:  Heparin level 0.3-0.7 units/ml Monitor platelets by anticoagulation protocol: Yes   Plan:  Reduce heparin infusion to 1050 units/hr Check heparin level in 8 hrs Restart warfarin tonight, give 2.5 mg PO X 1  Monitor daily heparin level, INR and CBC Monitor for bleeding  Gillermina Hu, PharmD, BCPS, [facility] Clinical Pharmacist 10/26/2020 6:11 PM

## 2020-10-26 NOTE — Progress Notes (Addendum)
[facility] Progress Note   Subjective:  Bleeding from AVG yesterday-->To OR emergently for revision of left thigh graft  Seen in room. Endorses some pain in thigh following surgery. For dialysis today.   Objective Vitals:   10/26/20 0359 10/26/20 0400 10/26/20 0751 10/26/20 0752  BP:  (!) 135/52 (!) 150/56   Pulse: 69 69  69  Resp: '12 11  16  '$ Temp:  98.4 F (36.9 C)  97.8 F (36.6 C)  TempSrc:  Oral  Oral  SpO2: 100% 100%  100%  Weight: 91.5 kg      Physical Exam General: Well appearing woman, NAD. Room air. Heart: RRR; no murmur Lungs: CTAB Abdomen: soft Extremities: No LE edema Dialysis Access: L thigh AVG, + bruit.   Additional Objective Labs: Basic Metabolic Panel: Recent Labs  Lab 10/22/20 1259 10/23/20 0611 10/24/20 0300  NA 140 136 137  K 5.5* 3.6 3.9  CL 102 100 98  CO2 '26 28 29  '$ GLUCOSE 109* 82 94  BUN 27* 9 16  CREATININE 7.38* 3.67* 5.46*  CALCIUM 8.6* 8.3* 8.2*  PHOS  --  3.2  --    Liver Function Tests: Recent Labs  Lab 10/23/20 0611 10/24/20 0934  AST  --  18  ALT  --  9  ALKPHOS  --  44  BILITOT  --  0.7  PROT  --  5.6*  ALBUMIN 2.8* 2.9*   Recent Labs  Lab 10/24/20 0934  LIPASE 24   CBC: Recent Labs  Lab 10/22/20 1259 10/23/20 0611 10/23/20 2140 10/24/20 0300 10/25/20 1535 10/26/20 0721  WBC 6.8 5.8  --  5.5 7.5 6.1  NEUTROABS 4.3  --   --   --   --   --   HGB 7.8* 6.8*   < > 8.5* 9.7* 8.8*  HCT 25.1* 21.8*   < > 25.3* 29.1* 27.6*  MCV 84.5 82.6  --  80.8 80.8 82.4  PLT 216 196  --  193 192 203   < > = values in this interval not displayed.   Medications: .  ceFAZolin (ANCEF) IV    . heparin 1,200 Units/hr (10/26/20 0548)   . calcitRIOL  1 mcg Oral Q M,W,F-HD  . Chlorhexidine Gluconate Cloth  6 each Topical Daily  . Chlorhexidine Gluconate Cloth  6 each Topical Q0600  . cinacalcet  30 mg Oral Q M,W,F-HD  . famotidine  20 mg Oral Daily  . gabapentin  100 mg Oral QHS  .  HYDROmorphone (DILAUDID)  injection  0.5 mg Intravenous Once  . insulin aspart  0-6 Units Subcutaneous TID WC  . pantoprazole  40 mg Oral BID AC  . sertraline  50 mg Oral Daily  . sevelamer carbonate  800 mg Oral TID WC  . simvastatin  40 mg Oral q1800  . sodium chloride flush  10-40 mL Intracatheter Q12H  . sodium chloride flush  3 mL Intravenous Q12H    Dialysis Orders: MWF NW 4h 4/00/500 90.5kg 3K/2.5 bath Hep none L thigh AVG - mircera 100 q2 last 4/29, due 5/13 - calcitriol 1 ug po tiw  Assessment/Plan: 1. AV graft eschar - w/ recent significant bleeding episode at home. VVS following --s/p revision of left thigh AVG on 5/8. Dr. Carlis Abbott.   2. Hyperkalemia - resolved s/p HD 3. ESRD: Usual MWF schedule, got HD overnight Thur-Fri for hyperK -> Back on schedule next HD on Monday 5/9.  4. BP/ volume: Low on admit, then up with holding  meds -BP stable this am. No gross volume on exam  5. Anemia of ESRD: Hgb 6.8 on admit, s/p 1U PRBCs on 5/6. ESA due on 5/13. Hgb 8.8  6. MBD of ESRD: Continue binder, VDRA/sensipar q HD 7. DM2 - per pmd 8. 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 Heparin/warfarin per pharmacy    [facility] 10/26/2020,8:41 AM  Seen and examined independently this AM.  Agree with note and exam as documented above by physician extender and as noted here.  Taken to OR emergently on 5/8 for left thigh AVG revision after bleeding AVG.  Patient states episode was scary but she's coping and thankful for care she has received.  Per vascular can use her graft for dialysis but stick the medial side where it is marked  General adult female in bed in no acute distress HEENT normocephalic atraumatic extraocular movements intact sclera anicteric Neck supple trachea midline Lungs clear to auscultation bilaterally normal work of breathing at rest  Heart S1S2 no rub Abdomen soft nontender nondistended Extremities no edema  Psych normal mood and affect Neuro -  alert and oriented x 3 provides hx and follows commands Access; left thigh graft wrapped  AV graft eschar with bleeding - s/p emergent revision 5/8. Appreciate vascular   ESRD - HD MWF   Anemia CKD and acute blood loss- redose ESA on 5/13 when due  Claudia Desanctis, MD 10/26/2020  9:35 AM

## 2020-10-26 NOTE — Progress Notes (Signed)
Subjective:  HD4  No acute events overnight. Patient evaluated at bedside this AM. Says she is doing "terrible" due to her pain. Reports abdominal pain that has persisted and had improved with pain medication through her port. Does not currently have an appetite. Reports [place] stool, no blood or black stools. Denies nausea, chest pain, dyspnea. Discussed plan for HD today, bridge anticoagulation.   Objective:  Vital signs in last 24 hours: Vitals:   10/25/20 2000 10/26/20 0000 10/26/20 0359 10/26/20 0400  BP: 96/72 (!) 123/59  (!) 135/52  Pulse:  71 69 69  Resp:  '15 12 11  '$ Temp: 97.9 F (36.6 C) 98.5 F (36.9 C)  98.4 F (36.9 C)  TempSrc: Oral Oral  Oral  SpO2:  99% 100% 100%  Weight:   91.5 kg    Physical Exam: General: Chronically ill-appearing, laying in bed no acute distress CV: Regular rate, rhythm. No m/r/g Pulm: Normal work of breathing on room air. Abdomen: Soft, non-distended. Epigastric tenderness present Skin: Bandage over L thigh surgical site. No erythema or drainage visible.  CBC Latest Ref Rng & Units 10/26/2020 10/25/2020 10/24/2020  WBC 4.0 - 10.5 K/uL 6.1 7.5 5.5  Hemoglobin 12.0 - 15.0 g/dL 8.8(L) 9.7(L) 8.5(L)  Hematocrit 36.0 - 46.0 % 27.6(L) 29.1(L) 25.3(L)  Platelets 150 - 400 K/uL 203 192 193   BMP Latest Ref Rng & Units 10/26/2020 10/24/2020 10/23/2020  Glucose 70 - 99 mg/dL 161(H) 94 82  BUN 8 - 23 mg/dL 39(H) 16 9  Creatinine 0.44 - 1.00 mg/dL 8.90(H) 5.46(H) 3.67(H)  Sodium 135 - 145 mmol/L 138 137 136  Potassium 3.5 - 5.1 mmol/L 5.1 3.9 3.6  Chloride 98 - 111 mmol/L 102 98 100  CO2 22 - 32 mmol/L '25 29 28  '$ Calcium 8.9 - 10.3 mg/dL 7.9(L) 8.2(L) 8.3(L)   Assessment/Plan: Ashlee Miles is 80yo person living with ESRD on MWF HD, HFpEF, T2DM, protein C deficiency with prior DVT on Coumadin admitted 5/5 with AV graft ulceration, now POD1 from revision.   Principal Problem:   Complication of vascular access for dialysis Active Problems:   Essential  hypertension   Chronic diastolic CHF (congestive heart failure) ([facility])   ESRD on dialysis ([place])   Anemia due to chronic kidney disease   Epigastric pain   Diabetes mellitus with end stage renal disease ([place])   Protein C deficiency ([facility])  #AV graft ulceration, POD1 s/p revision #Protein C deficiency with prior DVT Patient underwent emergent graft revision yesterday. Tolerated procedure well, no current complications. Vascular surgery following, appreciate recommendations. Re-started heparin drip last night, plan to re-start warfarin today for bridge. INR 1.4 this AM, goal 2-3. Appreciate pharmacy assistance. Will continue dilaudid for pain control - C/w heparin gtt - Start warfarin this evening per pharmacy - Dilaudid 0.'5mg'$  q8h PRN - Daily CBC, INR, heparin level  #ESRD on MWF HD Patient appears euvolemic on exam today. No electrolyte abnormalities on routine labs this AM. Plan for HD today. Per patient request will re-start home lidocaine cream to help with pain. - HD today per nephrology, appreciate assistance - Home lidocaine-prilocaine cream 1h prior to HD - C/w binder, plan for Mircera 5/13  #Epigastric pain Continues to have epigastric pain despite PPI, H2 blocker, and overall unremarkable work-up this far (lipase, HFP, troponin, abdominal XR). Will give another GI cocktail today, hopeful for improvement. Most likely etiology is GERD, but if continues to persist despite treatment likely will need further imaging/work-up.  - Protonix '40mg'$  BID -  Famotidine '20mg'$  QHS - GI cocktail   #Normocytic anemia 2/2 chronic kidney disease Hgb stable, 8.8 this AM despite blood loss and surgery yesterday. No signs of bleeding this morning. She is due for Mircera at 5/13 per nephrology. - Daily CBC - Transfuse Hgb <7 - Mircera 5/13  #Type 2 DM CBG's elevated overnight. However, patient is currently not eating and given she is ESRD on dialysis will hold off further titration of insulin. If  continues to have glucose >200 can consider low-dose long acting. - CBG monitoring - SSI  #HFpEF #Hypertension BP mildly elevated this morning, however she is due for HD today. Will continue to hold amlodipine and coreg, especially as she is not eating very well. Will continue to monitor. - Hold home amlodipine, coreg  DIET: CM IVF: n/a DVT PPX: heparin, warfarin BOWEL: n/a CODE: DNR/DNI FAM COM: n/a  Prior to Admission Living Arrangement: Home Anticipated Discharge Location: Home Barriers to Discharge: medical management, coumadin bridge Dispo: Anticipated discharge in approximately 2-3 day(s).   [name], MD 10/26/2020, 6:44 AM Pager: [phone] After 5pm on weekdays and 1pm on weekends: On Call pager [phone]

## 2020-10-26 NOTE — Progress Notes (Addendum)
  Progress Note    10/26/2020 7:17 AM 1 Day Post-Op  Subjective:  Says she has pain from surgery  Afebrile   Vitals:   10/26/20 0359 10/26/20 0400  BP:  (!) 135/52  Pulse: 69 69  Resp: 12 11  Temp:  98.4 F (36.9 C)  SpO2: 100% 100%    Physical Exam: General:  No distress Lungs:  Non labored Incisions:  Clean and dry Extremities:  +thrill in graft   CBC    Component Value Date/Time   WBC 7.5 10/25/2020 1535   RBC 3.60 (L) 10/25/2020 1535   HGB 9.7 (L) 10/25/2020 1535   HCT 29.1 (L) 10/25/2020 1535   PLT 192 10/25/2020 1535   MCV 80.8 10/25/2020 1535   MCH 26.9 10/25/2020 1535   MCHC 33.3 10/25/2020 1535   RDW 16.5 (H) 10/25/2020 1535   LYMPHSABS 1.5 10/22/2020 1259   MONOABS 0.6 10/22/2020 1259   EOSABS 0.3 10/22/2020 1259   BASOSABS 0.0 10/22/2020 1259    BMET    Component Value Date/Time   NA 137 10/24/2020 0300   K 3.9 10/24/2020 0300   CL 98 10/24/2020 0300   CO2 29 10/24/2020 0300   GLUCOSE 94 10/24/2020 0300   BUN 16 10/24/2020 0300   CREATININE 5.46 (H) 10/24/2020 0300   CALCIUM 8.2 (L) 10/24/2020 0300   CALCIUM 7.8 (L) 03/13/2017 1559   GFRNONAA 7 (L) 10/24/2020 0300   GFRAA 6 (L) 03/24/2020 0500    INR    Component Value Date/Time   INR 1.3 (H) 10/25/2020 1535     Intake/Output Summary (Last 24 hours) at 10/26/2020 0717 Last data filed at 10/26/2020 0548 Gross per 24 hour  Intake 1069.68 ml  Output 20 ml  Net 1049.68 ml     Assessment:  80 y.o. female is s/p:  Revision of left thigh AVG with interposition graft  1 Day Post-Op  Plan: -+thrill in left AVG.  Do not stick between incisions on the lateral aspect of the graft x 4 weeks.  Stick medial side for 4 weeks. -can follow up with VVS as needed. -DVT prophylaxis:  Heparin gtt ordered to restart last evening   Leontine Locket, PA-C [facility] [phone] 10/26/2020 7:17 AM  I have seen and evaluated the patient. I agree with the PA note as documented  above.  Postop day 1 status post revision of left thigh AV loop graft with interposition after acute bleeding event over the weekend.  Incisions are clean dry and intact.  She has appreciable thrill in the graft.  Heparin has been restarted overnight and suggest no bolus.  Can use graft for dialysis but stick the medial side where it is marked.  Marty Heck, MD [facility] [place] Office: [phone]

## 2020-10-27 DIAGNOSIS — R109 Unspecified abdominal pain: Secondary | ICD-10-CM

## 2020-10-27 DIAGNOSIS — R1084 Generalized abdominal pain: Secondary | ICD-10-CM

## 2020-10-27 LAB — CBC
HCT: 30.2 % — ABNORMAL LOW (ref 36.0–46.0)
Hemoglobin: 9.5 g/dL — ABNORMAL LOW (ref 12.0–15.0)
MCH: 26.6 pg (ref 26.0–34.0)
MCHC: 31.5 g/dL (ref 30.0–36.0)
MCV: 84.6 fL (ref 80.0–100.0)
Platelets: 177 10*3/uL (ref 150–400)
RBC: 3.57 MIL/uL — ABNORMAL LOW (ref 3.87–5.11)
RDW: 17.1 % — ABNORMAL HIGH (ref 11.5–15.5)
WBC: 8.4 10*3/uL (ref 4.0–10.5)
nRBC: 0 % (ref 0.0–0.2)

## 2020-10-27 LAB — PROTIME-INR
INR: 1.4 — ABNORMAL HIGH (ref 0.8–1.2)
Prothrombin Time: 17 seconds — ABNORMAL HIGH (ref 11.4–15.2)

## 2020-10-27 LAB — RENAL FUNCTION PANEL
Albumin: 2.7 g/dL — ABNORMAL LOW (ref 3.5–5.0)
Anion gap: 10 (ref 5–15)
BUN: 16 mg/dL (ref 8–23)
CO2: 26 mmol/L (ref 22–32)
Calcium: 7.5 mg/dL — ABNORMAL LOW (ref 8.9–10.3)
Chloride: 100 mmol/L (ref 98–111)
Creatinine, Ser: 4.58 mg/dL — ABNORMAL HIGH (ref 0.44–1.00)
GFR, Estimated: 9 mL/min — ABNORMAL LOW (ref >60)
Glucose, Bld: 168 mg/dL — ABNORMAL HIGH (ref 70–99)
Phosphorus: 3.2 mg/dL (ref 2.5–4.6)
Potassium: 4.2 mmol/L (ref 3.5–5.1)
Sodium: 136 mmol/L (ref 135–145)

## 2020-10-27 LAB — GLUCOSE, CAPILLARY
Glucose-Capillary: 149 mg/dL — ABNORMAL HIGH (ref 70–99)
Glucose-Capillary: 139 mg/dL — ABNORMAL HIGH (ref 70–99)
Glucose-Capillary: 121 mg/dL — ABNORMAL HIGH (ref 70–99)
Glucose-Capillary: 218 mg/dL — ABNORMAL HIGH (ref 70–99)

## 2020-10-27 LAB — HEPARIN LEVEL (UNFRACTIONATED)
Heparin Unfractionated: 0.68 IU/mL (ref 0.30–0.70)
Heparin Unfractionated: 0.41 IU/mL (ref 0.30–0.70)

## 2020-10-27 MED ORDER — WARFARIN SODIUM 4 MG PO TABS: 4.0000 mg | ORAL_TABLET | Freq: Once | ORAL | Status: DC

## 2020-10-27 MED ORDER — HYDROMORPHONE HCL 1 MG/ML IJ SOLN
0.5000 mg | Freq: Four times a day (QID) | INTRAMUSCULAR | Status: DC | PRN
Start: 2020-10-27 — End: 2020-10-27

## 2020-10-27 MED ORDER — HYDROMORPHONE HCL 1 MG/ML IJ SOLN
1.0000 mg | Freq: Three times a day (TID) | INTRAMUSCULAR | Status: DC
  Administered 2020-10-27 – 2020-10-28 (×3): 1 mg via INTRAVENOUS
  Filled 2020-10-27 (×3): qty 1

## 2020-10-27 MED ORDER — HYDROMORPHONE HCL 1 MG/ML IJ SOLN: 2.0000 mg | INTRAMUSCULAR | Status: DC

## 2020-10-27 MED ORDER — HYDROMORPHONE HCL 1 MG/ML IJ SOLN: 0.5000 mg | Freq: Four times a day (QID) | INTRAMUSCULAR | Status: DC

## 2020-10-27 MED ORDER — ACETAMINOPHEN 325 MG PO TABS
650.0000 mg | ORAL_TABLET | Freq: Four times a day (QID) | ORAL | Status: DC | PRN
  Administered 2020-10-27 – 2020-11-04 (×3): 650 mg via ORAL
  Filled 2020-10-27 (×4): qty 2

## 2020-10-27 MED ORDER — ACETAMINOPHEN 325 MG PO TABS: 650.0000 mg | ORAL_TABLET | Freq: Four times a day (QID) | ORAL | Status: DC

## 2020-10-27 NOTE — Progress Notes (Signed)
Pt called staff to room due to bleeding from left thigh graft. Upon entering room, pt was sitting on edge of bed with blood in floor underneath her. Pressure applied to site. Staff assisted pt back in bed. Vitals obtained and stable. Vascular team notified. Heparin drip stopped, per MD.

## 2020-10-27 NOTE — Progress Notes (Signed)
[facility] [place]  Subjective  -still having pain left thigh graft status post revision   Objective (!) 91/40 78 98.4 F (36.9 C) (Oral) 20 100%  Intake/Output Summary (Last 24 hours) at 10/27/2020 0749 Last data filed at 10/27/2020 0428 Gross per 24 hour  Intake 375.25 ml  Output -491 ml  Net 866.25 ml    Left thigh AV graft revision incisions clean dry intact Appreciable thrill  Laboratory Lab Results: Recent Labs    10/26/20 0721 10/27/20 0309  WBC 6.1 8.4  HGB 8.8* 9.5*  HCT 27.6* 30.2*  PLT 203 177   BMET Recent Labs    10/26/20 0721 10/27/20 0309  NA 138 136  K 5.1 4.2  CL 102 100  CO2 25 26  GLUCOSE 161* 168*  BUN 39* 16  CREATININE 8.90* 4.58*  CALCIUM 7.9* 7.5*    COAG Lab Results  Component Value Date   INR 1.4 (H) 10/27/2020   INR 1.4 (H) 10/26/2020   INR 1.3 (H) 10/25/2020   No results found for: PTT  Assessment/Planning:  80 year old female status post revision of left thigh AV graft with interposition for bleeding.  Incisions look good this morning and has a thrill in the graft.  I saw her in dialysis yesterday afternoon and she was finally amendable to sticking on the medial side of the graft with skin cream and pain medicine.  Discussed if she can continue to tolerate this we can avoid tunneled dialysis catheter.  We will arrange follow-up in 1 month.  Marty Heck 10/27/2020 7:49 AM --

## 2020-10-27 NOTE — Progress Notes (Signed)
[facility] for IV Heparin >> Warfarin Indication: h/o DVT/PE, Protein C deficiency  No Known Allergies  Patient Measurements: Weight: 93.9 kg (207 lb 0.2 oz) Heparin Dosing Weight: 80 kg  Vital Signs: Temp: 98.4 F (36.9 C) (05/10 0347) Temp Source: Oral (05/10 0347) BP: 91/40 (05/10 0347) Pulse Rate: 78 (05/10 0347)  Labs: Recent Labs    10/24/20 0934 10/24/20 1928 10/25/20 1535 10/26/20 0721 10/26/20 1726 10/27/20 0309  HGB  --    < > 9.7* 8.8*  --  9.5*  HCT  --   --  29.1* 27.6*  --  30.2*  PLT  --   --  192 203  --  177  LABPROT  --   --  16.3* 17.4*  --  17.0*  INR  --   --  1.3* 1.4*  --  1.4*  HEPARINUNFRC  --    < >  --  0.65 0.89* 0.68  CREATININE  --   --   --  8.90*  --  4.58*  TROPONINIHS 32*  --   --   --   --   --    < > = values in this interval not displayed.    Estimated Creatinine Clearance: 11.1 mL/min (A) (by C-G formula based on SCr of 4.58 mg/dL (H)).   Medical History: Past Medical History:  Diagnosis Date  . CHF (congestive heart failure) ([place])   . Diabetes mellitus without complication ([place])   . ESRD (end stage renal disease) on dialysis ([place])   . History of pituitary adenoma   . Hypertension   . Protein C deficiency [facility])     Assessment: 80 y.o. female on HD on warfarin PTA for hx PE/DVT and protein C deficiency; supratherapeutic INR at 5.2 on admission 10/22/20.  Warfarin was placed on hold, and pt was started on IV Heparin.   Heparin infusion was stopped 5/8 and patient was taken emergently to OR for bleeding AVG, ulceration of left thigh AV graft, S/P revision 5/8.  Protamine 30 mg IV given on 5/8.   Heparin infusion was resumed on 5/8 at 2000 PM; heparin level was therapeutic (0.64 units/ml.) on heparin infusion at 1200 units/hr earlier today. INR 1.4, Hgb stable 8s-9s, plt WNL. Pt rec'd blood transfusion on 5/6.   Pharmacy was consulted to resume warfarin tonight, 5/9.  PTA Warfarin dose:   Outpatient pharmacy reports last filled (LF) dates for warfarin as follows:  Warfarin 2 mg LF date 08/19/20 and 5 mg LF date 08/06/20 (have attempted to confirm home medication /dosage with patient's family member and this is still in process).  Last hospitalization note in Epic indicated: Warfarin 5 mg daily per discharge summary on 03/26/2020.   Confirmatory heparin level on heparin infusion at 1200 units/hr was 0.89 units/ml this afternoon (drawn ~10 hrs after earlier therapeutic level today), which is above the goal range for this pt. Per HD RN, no issues with IV or bleeding observed.  5/10 AM update:  Heparin level therapeutic after rate decrease  Goal of Therapy:  Heparin level 0.3-0.7 units/ml Monitor platelets by anticoagulation protocol: Yes   Plan:  Cont heparin 1050 units/hr 1200 heparin level  Narda Bonds, PharmD, BCPS Clinical Pharmacist Phone: [phone]

## 2020-10-27 NOTE — Progress Notes (Signed)
Subjective:  HD5  Patient evaluated at bedside this AM. Ms. Wohlers states continues to endorse diffuse abdominal pain since surgery that seems to be associated with food. She has been having normal BM's and last one was last night. She denies any nausea or vomiting. She does endorse substernal CP when she's trying to eat that she cannot describe that occurs when she eats. She says this pain had been intermittent before she was in the hospital although has gotten worse since she's been in the hospital. She is unsure whether medications are helping her pain. She says she continues to make a little bit of urine. Denies fevers or dysuria.   Objective:  Vital signs in last 24 hours: Vitals:   10/26/20 1830 10/26/20 1901 10/26/20 2329 10/27/20 0347  BP: (!) 95/50 124/65 (!) 121/42 (!) 91/40  Pulse: 73 82 75 78  Resp: '14 16 16 20  '$ Temp: (!) 97.5 F (36.4 C) 97.7 F (36.5 C) 97.8 F (36.6 C) 98.4 F (36.9 C)  TempSrc: Oral Oral Oral Oral  SpO2: 96% 97% 90% 100%  Weight: 92.2 kg   93.9 kg   Physical Exam: General: Chronically ill-appearing, laying in bed in no acute distress CV: Regular rate, rhythm. No m/r/g appreciated Chest: Pain with light palpation of chest, mostly on left side Pulm: Normal work of breathing, no use accessory muscles Abdomen: Soft, non-distended. Exquisitely tender to light palpation diffusely. Skin: Bandage over L thigh graft. No erythema or drainage visible.  CBC Latest Ref Rng & Units 10/27/2020 10/26/2020 10/25/2020  WBC 4.0 - 10.5 K/uL 8.4 6.1 7.5  Hemoglobin 12.0 - 15.0 g/dL 9.5(L) 8.8(L) 9.7(L)  Hematocrit 36.0 - 46.0 % 30.2(L) 27.6(L) 29.1(L)  Platelets 150 - 400 K/uL 177 203 192   BMP Latest Ref Rng & Units 10/27/2020 10/26/2020 10/24/2020  Glucose 70 - 99 mg/dL 168(H) 161(H) 94  BUN 8 - 23 mg/dL 16 39(H) 16  Creatinine 0.44 - 1.00 mg/dL 4.58(H) 8.90(H) 5.46(H)  Sodium 135 - 145 mmol/L 136 138 137  Potassium 3.5 - 5.1 mmol/L 4.2 5.1 3.9  Chloride 98 - 111  mmol/L 100 102 98  CO2 22 - 32 mmol/L '26 25 29  '$ Calcium 8.9 - 10.3 mg/dL 7.5(L) 7.9(L) 8.2(L)   Assessment/Plan: Boyd Muraco is 80yo person living with ESRD on MWF HD, HFpEF, T2DM, protein C deficiency with prior DVT on Coumadin admitted 5/5 with AV graft ulceration, now POD2 from revision, continuing to have diffuse abdominal pain.  Principal Problem:   Complication of vascular access for dialysis Active Problems:   Essential hypertension   Chronic diastolic CHF (congestive heart failure) (HCC)   ESRD on dialysis ([place])   Anemia due to chronic kidney disease   Epigastric pain   Diabetes mellitus with end stage renal disease ([place])   Protein C deficiency (H[facility])  #AV graft ulceration, POD2 s/p revision #Protein C deficiency with prior DVT Patient continues to be afebrile, hemodynamically stable. Revision appears to be doing well with HD. INR 1.4 this AM, appreciate pharmacy for dosing recommendations. Will need to have INR 2-3 for therapeutic warfarin dosing. Of note, patient takes oxycodone '10mg'$  q6h at home, confirmed with PDMP. Will increase dilaudid here for better pain control.  - C/w heparin gtt - Warfarin per pharmacy, INR goal 2-3 - IV dilaudid '1mg'$  q8h - Daily CBC, INR, heparin level  #Diffuse abdominal pain Patient has had pantoprazole, famotidine, and multiple GI cocktails without relief of pain. This morning on exam abdominal pain appears to  be worse. Patient was wincing with very light palpation. Given she likely has poor vasculature concern for possible mesenteric ischemia. She is also having chest pain, but this appears to be similar pain to her abdominal pain. Previous abdominal XR on 5/8 without obstructive bowel gas pattern. Troponin on arrival at baseline.  Discussed with nephrology, okay to obtain CTA with HD tomorrow. In addition, appears she is on oxycodone '10mg'$  q6h PRN at home, last rx written 4/28 per PDMP. Also possible patient just requires increased pain dosing at  baseline. - F/u abdominal CTA - Pantoprazole '40mg'$  BID - Famotidine '20mg'$  QHS - IV dilaudid '1mg'$  q8h  #ESRD on MWF HD Patient euvolemic on exam, no electrolyte abnormalities on lab work this morning. Will plan for HD tomorrow. Appreciate nephrology's assistance. - HD tomorrow - C/w binder, plan for Mircera 5/13  #Normocytic anemia 2/2 chronic kidney disease Hgb stable, no signs of bleeding. Will continue bridging to warfarin. Mircera due 5/13 per nephrology.  - Daily CBC - Transfuse Hgb <7 - Mircera 5/13  #Type 2 DM CBG's have been well-controlled over last 24h. Likely will have to adjust when patient is able to start eating again. - CBG monitoring - SSI  #HFpEF #Hypertension BP and volume control with HD, holding home antihypertensives although apparently not taking. Currently stable.  DIET: CM IVF: n/a DVT PPX: heparin, warfarin BOWEL: n/a CODE: DNR/DNI FAM COM: n/a  Prior to Admission Living Arrangement: Home Anticipated Discharge Location: Home Barriers to Discharge: medical management Dispo: Anticipated discharge in approximately 2-3 day(s).   [name], MD 10/27/2020, 7:36 AM Pager: [phone] After 5pm on weekdays and 1pm on weekends: On Call pager [phone]

## 2020-10-27 NOTE — Progress Notes (Addendum)
[facility] Progress Note   Subjective:  Completed dialysis yesterday -minimal UF with hypotension L thigh Graft site remains tender s/p revision    Objective Vitals:   10/26/20 1901 10/26/20 2329 10/27/20 0347 10/27/20 0803  BP: 124/65 (!) 121/42 (!) 91/40 (!) 108/42  Pulse: 82 75 78 75  Resp: '16 16 20 12  '$ Temp: 97.7 F (36.5 C) 97.8 F (36.6 C) 98.4 F (36.9 C) 98.5 F (36.9 C)  TempSrc: Oral Oral Oral Oral  SpO2: 97% 90% 100% 99%  Weight:   93.9 kg    Physical Exam General: Well appearing woman, NAD. Room air. Heart: RRR; no murmur Lungs: CTAB Abdomen: soft Extremities: No LE edema Dialysis Access: L thigh AVG, + bruit.; very tender to palpation   Additional Objective Labs: Basic Metabolic Panel: Recent Labs  Lab 10/23/20 0611 10/24/20 0300 10/26/20 0721 10/27/20 0309  NA 136 137 138 136  K 3.6 3.9 5.1 4.2  CL 100 98 102 100  CO2 '28 29 25 26  '$ GLUCOSE 82 94 161* 168*  BUN 9 16 39* 16  CREATININE 3.67* 5.46* 8.90* 4.58*  CALCIUM 8.3* 8.2* 7.9* 7.5*  PHOS 3.2  --  5.0* 3.2   Liver Function Tests: Recent Labs  Lab 10/24/20 0934 10/26/20 0721 10/27/20 0309  AST 18  --   --   ALT 9  --   --   ALKPHOS 44  --   --   BILITOT 0.7  --   --   PROT 5.6*  --   --   ALBUMIN 2.9* 2.7* 2.7*   Recent Labs  Lab 10/24/20 0934  LIPASE 24   CBC: Recent Labs  Lab 10/22/20 1259 10/23/20 0611 10/23/20 2140 10/24/20 0300 10/25/20 1535 10/26/20 0721 10/27/20 0309  WBC 6.8 5.8  --  5.5 7.5 6.1 8.4  NEUTROABS 4.3  --   --   --   --   --   --   HGB 7.8* 6.8*   < > 8.5* 9.7* 8.8* 9.5*  HCT 25.1* 21.8*   < > 25.3* 29.1* 27.6* 30.2*  MCV 84.5 82.6  --  80.8 80.8 82.4 84.6  PLT 216 196  --  193 192 203 177   < > = values in this interval not displayed.   Medications: . heparin 1,050 Units/hr (10/27/20 0428)   . calcitRIOL  1 mcg Oral Q M,W,F-HD  . Chlorhexidine Gluconate Cloth  6 each Topical Daily  . Chlorhexidine Gluconate Cloth  6 each  Topical Q0600  . cinacalcet  30 mg Oral Q M,W,F-HD  . famotidine  20 mg Oral Daily  . gabapentin  100 mg Oral QHS  . insulin aspart  0-6 Units Subcutaneous TID WC  . lidocaine-prilocaine   Topical QODAY  . pantoprazole  40 mg Oral BID AC  . sertraline  50 mg Oral Daily  . sevelamer carbonate  800 mg Oral TID WC  . simvastatin  40 mg Oral q1800  . sodium chloride flush  10-40 mL Intracatheter Q12H  . sodium chloride flush  3 mL Intravenous Q12H  . Warfarin - Pharmacist Dosing Inpatient   Does not apply [id number]    Dialysis Orders: MWF NW 4h 4/00/500 90.5kg 3K/2.5 bath Hep none L thigh AVG - mircera 100 q2 last 4/29, due 5/13 - calcitriol 1 ug po tiw  Assessment/Plan: 1. AV graft eschar - w/ recent significant bleeding episode at home. VVS following --s/p revision of left thigh AVG on 5/8. Dr. Carlis Abbott.  2. Hyperkalemia - resolved s/p HD 3. ESRD: Usual MWF schedule, got HD overnight Thur-Fri for hyperK -> Back on schedule next HD 5/11 4. BP/ volume: Low on admit, then up with holding meds -BP drop on HD. No gross volume on exam. Minimal UF  5. Anemia of ESRD: Hgb 6.8 on admit, s/p 1U PRBCs on 5/6. ESA due on 5/13. Hgb 8.8>9.5   6. MBD of ESRD: Corr Ca/Phos ok. Continue binder, VDRA/sensipar q HD 7. DM2 - per pmd 8. 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 Heparin/warfarin per pharmacy    [place] 10/27/2020,9:58 AM   Seen and examined independently.  Agree with note and exam as documented above by physician extender and as noted here.  General adult female in bed uncomfortable citing operative pain HEENT normocephalic atraumatic extraocular movements intact sclera anicteric Neck supple trachea midline Lungs clear to auscultation bilaterally normal work of breathing at rest  Heart S1S2 no rub Abdomen soft nontender nondistended Extremities no pitting edema  Psych normal mood and affect Neuro - alert and oriented x 3 provides hx and  follows commands Access left thigh graft - tender to palpation; bruit noted  AV graft eschar with bleeding - s/p emergent revision 5/8.  Appreciate vascular  ESRD on HD MWF - for HD tomorrow.  She did let us stick her graft (medial aspect as per vascular recs) after some discussion and got HD on 5/9.   Hypotension - not on anti-hypertensive agents here or at home per pt report. Post-op and on pain meds.   Anemia CKD - Hb stable post-op.  ESA due 5/13  Claudia Desanctis, MD 10/27/2020  10:15 AM

## 2020-10-27 NOTE — Progress Notes (Addendum)
  Progress Note    10/27/2020 3:17 PM 2 Days Post-Op  Subjective:  Called by RN for bleeding from left thigh graft   Vitals:   10/27/20 0803 10/27/20 1148  BP: (!) 108/42 (!) 108/39  Pulse: 75 72  Resp: 12 12  Temp: 98.5 F (36.9 C) 98.3 F (36.8 C)  SpO2: 99% 99%   Physical Exam: Left thigh graft incisions intact. When dressings taken down there was no active bleeding. Visible cannulation sites it appears is where she was bleeding from. Some pulsatility palpable. No hematoma.    Assessment/Plan:  80 y.o. female is s/p revision of left thigh graft. Graft revision is intact and graft with good thrill. Had some active bleeding from cannulation site but now no further bleeding. Will stop heparin. Will order morning labs. She presently is hemodynamically stable. Continue to monitor  Karoline Caldwell, PA-C [facility] [phone] 10/27/2020 3:17 PM   I have examined the patient, reviewed and agree with above.  No bleeding currently.  Nurse reports that bleeding was not coming from the 2 surgical incisions for the revision.  Were coming from the puncture access sites on the distalmost portion of her femoral loop.  Will hold heparin.  INR 1.4.  Curt Jews, MD 10/27/2020 4:32 PM

## 2020-10-27 NOTE — Progress Notes (Signed)
Pt's daughter, [name], provided update on phone.

## 2020-10-27 NOTE — Progress Notes (Signed)
Mobility Specialist - Progress Note   10/27/20 1437  Mobility  Activity Dangled on edge of bed  Level of Assistance Maximum assist, patient does 25-49%  Assistive Device None  Mobility Sit up in bed/chair position for meals  Mobility Response Tolerated fair  Mobility performed by Mobility specialist  $Mobility charge 1 Mobility   Pre-mobility: 75 HR, 98% SpO2 During mobility: 77 HR, 135/56 BP, 97% SpO2 Post-mobility: 76 HR, 97% SpO2  Pt resistant to any kind of mobility, she eventually agreed to sit up on the edge of bed to eat her lunch. She required max assist to move her LE as well as to sit up due to her lack of participation. Pt stated she felt unwell when sitting up; but denied dizziness, lightheadedness, or nausea. Pt left sitting up on edge of bed w/ call bell at side. She was instructed to call staff when she'd like to lay down.   Pricilla Handler Mobility Specialist Mobility Specialist Phone: [phone]

## 2020-10-27 NOTE — Progress Notes (Signed)
[facility] for IV Heparin >> Warfarin Indication: h/o DVT/PE, Protein C deficiency  No Known Allergies  Patient Measurements: Weight: 93.9 kg (207 lb 0.2 oz) Heparin Dosing Weight: 80 kg  Vital Signs: Temp: 98.3 F (36.8 C) (05/10 1148) Temp Source: Oral (05/10 1148) BP: 108/39 (05/10 1148) Pulse Rate: 72 (05/10 1148)  Labs: Recent Labs    10/25/20 1535 10/26/20 0721 10/26/20 1726 10/27/20 0309 10/27/20 1202  HGB 9.7* 8.8*  --  9.5*  --   HCT 29.1* 27.6*  --  30.2*  --   PLT 192 203  --  177  --   LABPROT 16.3* 17.4*  --  17.0*  --   INR 1.3* 1.4*  --  1.4*  --   HEPARINUNFRC  --  0.65 0.89* 0.68 0.41  CREATININE  --  8.90*  --  4.58*  --     Estimated Creatinine Clearance: 11.1 mL/min (A) (by C-G formula based on SCr of 4.58 mg/dL (H)).   Medical History: Past Medical History:  Diagnosis Date  . CHF (congestive heart failure) ([place])   . Diabetes mellitus without complication ([place])   . ESRD (end stage renal disease) on dialysis ([place])   . History of pituitary adenoma   . Hypertension   . Protein C deficiency [facility])     Assessment: 80 y.o. female on HD on warfarin PTA for hx PE/DVT and protein C deficiency; supratherapeutic INR at 5.2 on admission 10/22/20.  Warfarin was placed on hold, and pt was started on IV Heparin. Heparin infusion was stopped 5/8 and patient was taken emergently to OR for bleeding AVG, ulceration of left thigh AV graft, S/P revision 5/8.  Protamine 30 mg IV given on 5/8.   PTA Warfarin dose:  Outpatient pharmacy reports last filled (LF) dates for warfarin as follows:  Warfarin 2 mg LF date 08/19/20 and 5 mg LF date 08/06/20 (have attempted to confirm home medication /dosage with patient's family member and this is still in process).  Last hospitalization note in Epic indicated: Warfarin 5 mg daily per discharge summary on 03/26/2020.   Heparin infusion was resumed on 5/8 at 2000 PM and pharmacy was consulted to  resume warfarin  5/9 with continued heparin bridge. Today 10/27/20 the confirmatory heparin level  remains therapeutic on heparin infusion at 1050 units/hr. No IV issues or  bleeding noted per RN's report. Hgb stable 8s-9s stable , pltc wnl - s/p reversal of INR ( 5.2 on 5/6) with Vitamin K 1 mg IV x1 on 5/6.   INR = 1.4 today   Goal of Therapy:  Heparin level 0.3-0.7 units/ml Monitor platelets by anticoagulation protocol: Yes    Plan:  Continue IV heparin drip at 1050 units/hr Give Warfarin 4 mg po today x1 Daily HL, INR, and CBC Monitor for bleeding  Nicole Cella, [facility] Clinical Pharmacist [phone] Please check AMION for all [place] phone numbers After 10:00 PM, call [place] [phone]  10/27/2020 1:49 PM

## 2020-10-28 ENCOUNTER — Inpatient Hospital Stay (HOSPITAL_COMMUNITY): Payer: 59

## 2020-10-28 LAB — CBC
HCT: 23.5 % — ABNORMAL LOW (ref 36.0–46.0)
Hemoglobin: 7.5 g/dL — ABNORMAL LOW (ref 12.0–15.0)
MCH: 26.5 pg (ref 26.0–34.0)
MCHC: 31.9 g/dL (ref 30.0–36.0)
MCV: 83 fL (ref 80.0–100.0)
Platelets: 130 10*3/uL — ABNORMAL LOW (ref 150–400)
RBC: 2.83 MIL/uL — ABNORMAL LOW (ref 3.87–5.11)
RDW: 16.5 % — ABNORMAL HIGH (ref 11.5–15.5)
WBC: 7.1 10*3/uL (ref 4.0–10.5)
nRBC: 0 % (ref 0.0–0.2)

## 2020-10-28 LAB — GLUCOSE, CAPILLARY
Glucose-Capillary: 109 mg/dL — ABNORMAL HIGH (ref 70–99)
Glucose-Capillary: 93 mg/dL (ref 70–99)
Glucose-Capillary: 102 mg/dL — ABNORMAL HIGH (ref 70–99)
Glucose-Capillary: 103 mg/dL — ABNORMAL HIGH (ref 70–99)

## 2020-10-28 LAB — RENAL FUNCTION PANEL
Albumin: 2.2 g/dL — ABNORMAL LOW (ref 3.5–5.0)
Anion gap: 8 (ref 5–15)
BUN: 27 mg/dL — ABNORMAL HIGH (ref 8–23)
CO2: 30 mmol/L (ref 22–32)
Calcium: 7.8 mg/dL — ABNORMAL LOW (ref 8.9–10.3)
Chloride: 97 mmol/L — ABNORMAL LOW (ref 98–111)
Creatinine, Ser: 6.58 mg/dL — ABNORMAL HIGH (ref 0.44–1.00)
GFR, Estimated: 6 mL/min — ABNORMAL LOW (ref >60)
Glucose, Bld: 115 mg/dL — ABNORMAL HIGH (ref 70–99)
Phosphorus: 4.6 mg/dL (ref 2.5–4.6)
Potassium: 4.3 mmol/L (ref 3.5–5.1)
Sodium: 135 mmol/L (ref 135–145)

## 2020-10-28 LAB — PROTIME-INR
INR: 1.3 — ABNORMAL HIGH (ref 0.8–1.2)
Prothrombin Time: 15.8 seconds — ABNORMAL HIGH (ref 11.4–15.2)

## 2020-10-28 LAB — HEPATITIS B SURFACE ANTIGEN: Hepatitis B Surface Ag: NONREACTIVE

## 2020-10-28 LAB — HEPARIN LEVEL (UNFRACTIONATED): Heparin Unfractionated: 0.1 IU/mL — ABNORMAL LOW (ref 0.30–0.70)

## 2020-10-28 LAB — PREPARE RBC (CROSSMATCH)

## 2020-10-28 MED ORDER — IOHEXOL 350 MG/ML SOLN
100.0000 mL | Freq: Once | INTRAVENOUS | Status: AC | PRN
  Administered 2020-10-28: 100 mL via INTRAVENOUS

## 2020-10-28 MED ORDER — HEPARIN SODIUM (PORCINE) 1000 UNIT/ML DIALYSIS: 1000.0000 [IU] | INTRAMUSCULAR | Status: DC | PRN

## 2020-10-28 MED ORDER — SODIUM CHLORIDE 0.9 % IV SOLN: 100.0000 mL | INTRAVENOUS | Status: DC | PRN

## 2020-10-28 MED ORDER — SODIUM CHLORIDE 0.9% IV SOLUTION: Freq: Once | INTRAVENOUS | Status: AC

## 2020-10-28 MED ORDER — WARFARIN SODIUM 4 MG PO TABS
4.0000 mg | ORAL_TABLET | Freq: Once | ORAL | Status: AC
  Administered 2020-10-28: 4 mg via ORAL
  Filled 2020-10-28: qty 1

## 2020-10-28 MED ORDER — PENTAFLUOROPROP-TETRAFLUOROETH EX AERO: 1.0000 "application " | INHALATION_SPRAY | CUTANEOUS | Status: DC | PRN

## 2020-10-28 MED ORDER — LIDOCAINE HCL (PF) 1 % IJ SOLN: 5.0000 mL | INTRAMUSCULAR | Status: DC | PRN

## 2020-10-28 MED ORDER — CALCITRIOL 0.5 MCG PO CAPS
ORAL_CAPSULE | ORAL | Status: AC
  Filled 2020-10-28: qty 2

## 2020-10-28 MED ORDER — LIDOCAINE-PRILOCAINE 2.5-2.5 % EX CREA: 1.0000 "application " | TOPICAL_CREAM | CUTANEOUS | Status: DC | PRN

## 2020-10-28 MED ORDER — HYDROMORPHONE HCL 1 MG/ML IJ SOLN: 1.0000 mg | Freq: Three times a day (TID) | INTRAMUSCULAR | Status: DC | PRN

## 2020-10-28 MED ORDER — HYDROMORPHONE HCL 2 MG PO TABS
4.0000 mg | ORAL_TABLET | Freq: Three times a day (TID) | ORAL | Status: DC
  Administered 2020-10-28 – 2020-10-30 (×6): 4 mg via ORAL
  Filled 2020-10-28 (×6): qty 2

## 2020-10-28 MED ORDER — WARFARIN SODIUM 2.5 MG PO TABS: 2.5000 mg | ORAL_TABLET | Freq: Once | ORAL | Status: DC

## 2020-10-28 MED ORDER — HEPARIN (PORCINE) 25000 UT/250ML-% IV SOLN
1150.0000 [IU]/h | INTRAVENOUS | Status: DC
  Administered 2020-10-28 – 2020-10-29 (×2): 1000 [IU]/h via INTRAVENOUS
  Administered 2020-10-30 – 2020-11-04 (×5): 1150 [IU]/h via INTRAVENOUS
  Filled 2020-10-28 (×7): qty 250

## 2020-10-28 NOTE — Progress Notes (Addendum)
[facility] Progress Note   Subjective:  Seen in HD unit. AVG running well but still having a lot of pain in thigh.     Objective Vitals:   10/28/20 0900 10/28/20 0930 10/28/20 1000 10/28/20 1030  BP: (!) 174/70 (!) 128/46 (!) 130/48 (!) 112/41  Pulse: 77 71  69  Resp: '12 13  14  '$ Temp:      TempSrc:      SpO2:      Weight:       Physical Exam General: Well appearing woman, NAD. Room air. Heart: RRR; no murmur Lungs: CTAB Abdomen: soft Extremities: No LE edema Dialysis Access: L thigh AVG, + bruit.; very tender to palpation   Additional Objective Labs: Basic Metabolic Panel: Recent Labs  Lab 10/26/20 0721 10/27/20 0309 10/28/20 0507  NA 138 136 135  K 5.1 4.2 4.3  CL 102 100 97*  CO2 '25 26 30  '$ GLUCOSE 161* 168* 115*  BUN 39* 16 27*  CREATININE 8.90* 4.58* 6.58*  CALCIUM 7.9* 7.5* 7.8*  PHOS 5.0* 3.2 4.6   Liver Function Tests: Recent Labs  Lab 10/24/20 0934 10/26/20 0721 10/27/20 0309 10/28/20 0507  AST 18  --   --   --   ALT 9  --   --   --   ALKPHOS 44  --   --   --   BILITOT 0.7  --   --   --   PROT 5.6*  --   --   --   ALBUMIN 2.9* 2.7* 2.7* 2.2*   Recent Labs  Lab 10/24/20 0934  LIPASE 24   CBC: Recent Labs  Lab 10/22/20 1259 10/23/20 0611 10/24/20 0300 10/25/20 1535 10/26/20 0721 10/27/20 0309 10/28/20 0507  WBC 6.8   < > 5.5 7.5 6.1 8.4 7.1  NEUTROABS 4.3  --   --   --   --   --   --   HGB 7.8*   < > 8.5* 9.7* 8.8* 9.5* 7.5*  HCT 25.1*   < > 25.3* 29.1* 27.6* 30.2* 23.5*  MCV 84.5   < > 80.8 80.8 82.4 84.6 83.0  PLT 216   < > 193 192 203 177 130*   < > = values in this interval not displayed.   Medications: . [START ON 10/29/2020] sodium chloride    . [START ON 10/29/2020] sodium chloride     . calcitRIOL      . calcitRIOL  1 mcg Oral Q M,W,F-HD  . Chlorhexidine Gluconate Cloth  6 each Topical Daily  . Chlorhexidine Gluconate Cloth  6 each Topical Q0600  . cinacalcet  30 mg Oral Q M,W,F-HD  . famotidine  20  mg Oral Daily  . gabapentin  100 mg Oral QHS  . HYDROmorphone  4 mg Oral Q8H  . insulin aspart  0-6 Units Subcutaneous TID WC  . lidocaine-prilocaine   Topical QODAY  . pantoprazole  40 mg Oral BID AC  . sertraline  50 mg Oral Daily  . sevelamer carbonate  800 mg Oral TID WC  . simvastatin  40 mg Oral q1800  . sodium chloride flush  10-40 mL Intracatheter Q12H  . sodium chloride flush  3 mL Intravenous Q12H  . warfarin  4 mg Oral ONCE-1600  . Warfarin - Pharmacist Dosing Inpatient   Does not apply [id number]    Dialysis Orders: MWF NW 4h 4/00/500 90.5kg 3K/2.5 bath Hep none L thigh AVG - mircera 100 q2 last 4/29, due 5/13 -  calcitriol 1 ug po tiw  Assessment/Plan: 1. AV graft eschar - w/ recent significant bleeding episode at home. VVS following --s/p revision of left thigh AVG on 5/8. Dr. Carlis Abbott.   2. Hyperkalemia - resolved s/p HD 3. ESRD: Usual MWF schedule,Back on schedule HD today.  4. BP/ volume: Variable BP-BP drops on HD. No gross volume on exam. Minimal UF  5. Anemia of ESRD: Hgb 6.8 on admit, s/p 1U PRBCs on 5/6. ESA due on 5/13. Hgb 8.8>9.5>7.5.   6. MBD of ESRD: Corr Ca/Phos ok. Continue binder, VDRA/sensipar q HD 7. DM2 - per pmd 8. 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 Heparin/warfarin per [place] 10/28/2020,10:37 AM  Seen and examined independently this am.  Agree with note and exam as documented above by physician extender and as noted here.  Patient had bleeding yesterday after she worked quite a bit with PT she states.  Had no bleeding after her last HD session.  Her bleeding was from her HD access site rather than surgical aspect per charting. Heparin gtt was stopped.  She has had continued discomfort.  She agreed to let us use the left thigh graft for HD today (medial aspect as per vascular recs).  General adult female in bed in no acute distress HEENT normocephalic atraumatic extraocular movements  intact sclera anicteric Neck supple trachea midline Lungs clear to auscultation bilaterally normal work of breathing at rest  Heart S1S2 no rub Abdomen soft nontender nondistended Extremities no edema  Psych normal mood and affect Neuro - alert and oriented x 3 provides hx and follows commands  Access left thigh graft with bruit; no active bleeding on my exam this am; her leg is tender  AV graft eschar with bleeding. S/p emergent revision. Appreciate vascular.  Heparin gtt is on hold   ESRD - HD per MWF schedule - today  Hypotension - improved  Anemia of CKD - PRBC's per primary team .  ESA due 5/13.  Claudia Desanctis, MD 10/28/2020  11:37 AM

## 2020-10-28 NOTE — TOC Initial Note (Signed)
Transition of Care [facility]) - Initial/Assessment Note    Patient Details  Name: Ashlee Miles MRN: [id number] Date of Birth: Jan 28, 1941  Transition of Care [facility]) CM/SW Contact:    Ashlee Sam, LCSW Phone Number: 10/28/2020, 2:40 PM  Clinical Narrative:                  CSW received call from patient's daughter Ashlee Miles stating she is interested in short term rehab for her mother, they have a preference for [facility] as patient and her husband have been there in the past. CSW explained that insurance will not cover SNF unless recommended by PT/OT which patient has yet to work with. Daughter Ashlee Miles expressed understanding and would like to be followed up with once PT/OT evals are in.   CSW has reached out to MD for orders for PT/OT to assess discharge level of care needed.   Expected Discharge Plan: Skilled Nursing Facility Barriers to Discharge: Continued Medical Work up   Patient Goals and CMS Choice   CMS Medicare.gov Compare Post Acute Care list provided to:: Patient Represenative (must comment) (daughter Ashlee Miles) Choice offered to / list presented to : Adult Children  Expected Discharge Plan and Services Expected Discharge Plan: [place] Choice: [place] arrangements for the past 2 months: Single Family Home                                      Prior Living Arrangements/Services Living arrangements for the past 2 months: Single Family Home Lives with:: Self,Spouse   Do you feel safe going back to the place where you live?: No   wants SNF  Need for Family Participation in Patient Care: Yes (Comment) Care giver support system in place?: Yes (comment)      Activities of Daily Living      Permission Sought/Granted Permission sought to share information with : Case Manager,Facility Contact Representative,Family Supports Permission granted to share information with : Yes, Verbal Permission  Granted  Share Information with NAME: Ashlee Miles  Permission granted to share info w AGENCY: SNFs  Permission granted to share info w Relationship: daughter  Permission granted to share info w Contact Information: [phone]  Emotional Assessment       Orientation: : Oriented to Self,Oriented to Place,Oriented to  Time,Oriented to Situation Alcohol / Substance Use: Not Applicable Psych Involvement: No (comment)  Admission diagnosis:  Complication of vascular access for dialysis, initial encounter [T82.9XXA] S/P arteriovenous (AV) fistula repair [id number], Z86.79] Patient Active Problem List   Diagnosis Date Noted  . Abdominal pain   . Complication of vascular access for dialysis   . S/P arteriovenous (AV) fistula repair 10/22/2020  . Skin necrosis ([place]) 04/28/2020  . AMS (altered mental status) 03/22/2020  . Hypoglycemia 03/22/2020  . Dyspnea 02/12/2019  . Memory loss 03/25/2018  . Generalized weakness 10/31/2017  . Palliative care encounter 10/31/2017  . Leg wound, left 07/12/2017  . Cellulitis of left lower extremity 07/12/2017  . HLD (hyperlipidemia) 07/12/2017  . Chest pain in adult 06/22/2017  . Elevated troponin   . Altered mental status   . Diabetes mellitus with end stage renal disease ([place]) 05/31/2017  . Depression 05/31/2017  . GERD (gastroesophageal reflux disease) 05/31/2017  . Left groin pain 05/31/2017  . Protein C deficiency ([place])   . 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 Falls 04/25/2017  .  Hypotension 04/25/2017  . Epigastric pain 04/25/2017  . Constipation 04/25/2017  . ESRD on dialysis ([place]) 04/06/2017  . Anemia due to chronic kidney disease 04/06/2017  . Thrombocytopenia ([place]) 04/06/2017  . Cough in adult 04/06/2017  . DVT (deep venous thrombosis) ([place]) 04/05/2017  . Pressure injury of skin 03/14/2017  . Acute metabolic encephalopathy [id number]  . Skin lesion of hand 03/12/2017  . TIA (transient ischemic attack) 03/01/2017  . Syncope 03/01/2017  .  Diabetes mellitus with complication ([place]) [id number]  . Essential hypertension 03/01/2017  . Chronic diastolic CHF (congestive heart failure) ([place]) 03/01/2017   PCP:  Ashlee Cuff, MD Pharmacy:   [id number] [place], [place]Phone: [phone]: [phone] [facility] [place] [place]Phone: [phone]: [phone] [address] [address]Phone: [phone]: [phone]    Social Determinants of Health (SDOH) Interventions    Readmission Risk Interventions Readmission Risk Prevention Plan 03/25/2020  Transportation Screening Complete  Palliative Care Screening Not Applicable  Skilled Nursing Facility Complete  Some recent data might be hidden

## 2020-10-28 NOTE — Progress Notes (Signed)
Attempted to obtain a PIV with no success. Pt currently has a R PAC with blood infusing. Poor vasculature. Present veins very deep. Pt refused a second assess on the right arm. Will report to primary RN.

## 2020-10-28 NOTE — Progress Notes (Signed)
[place] for IV Heparin >> Warfarin Indication: h/o DVT/PE, Protein C deficiency  No Known Allergies  Patient Measurements: Weight: 91 kg (200 lb 9.9 oz) Heparin Dosing Weight: 80 kg  Vital Signs: Temp: 98.2 F (36.8 C) (05/11 1348) Temp Source: Oral (05/11 1348) BP: 148/51 (05/11 1348) Pulse Rate: 87 (05/11 1348)  Labs: Recent Labs    10/26/20 0721 10/26/20 1726 10/27/20 0309 10/27/20 1202 10/28/20 0507  HGB 8.8*  --  9.5*  --  7.5*  HCT 27.6*  --  30.2*  --  23.5*  PLT 203  --  177  --  130*  LABPROT 17.4*  --  17.0*  --  15.8*  INR 1.4*  --  1.4*  --  1.3*  HEPARINUNFRC 0.65 0.89* 0.68 0.41  --   CREATININE 8.90*  --  4.58*  --  6.58*    Estimated Creatinine Clearance: 7.6 mL/min (A) (by C-G formula based on SCr of 6.58 mg/dL (H)).   Medical History: Past Medical History:  Diagnosis Date  . CHF (congestive heart failure) ([place])   . Diabetes mellitus without complication ([place])   . ESRD (end stage renal disease) on dialysis ([place])   . History of pituitary adenoma   . Hypertension   . Protein C deficiency [facility])     Assessment: 80 y.o. female on HD on warfarin PTA for hx PE/DVT and protein C deficiency; supratherapeutic INR at 5.2 on admission 10/22/20.  Warfarin was placed on hold, and pt was started on IV Heparin. Heparin infusion was stopped 5/8 and patient was taken emergently to OR for bleeding AVG, ulceration of left thigh AV graft, S/P revision 5/8.  Protamine 30 mg IV given on 5/8.   PTA Warfarin dose:  Outpatient pharmacy reports last filled (LF) dates for warfarin as follows:  Warfarin 2 mg LF date 08/19/20 and 5 mg LF date 08/06/20 (have attempted to confirm home medication /dosage with patient's family member and this is still in process).  Last hospitalization note in Epic indicated: Warfarin 5 mg daily per discharge summary on 03/26/2020.   Heparin stopped yesterday afternoon after nursing reported bleeding from left  thigh graft. Per vascular bleeding from cannulation site and no bleeding from surgical site revision and bleeding has resolved. Heparin and warfarin to restart this afternoon.   Heparin level 0.41 on 1050/hr, will restart at 1000/hr to keep at low end of range. INR 1.3, no warfarin given last night. Hgb also trending down to 7.5 this morning with plt count 130.   Goal of Therapy:  Heparin level 0.3-0.7 units/ml Monitor platelets by anticoagulation protocol: Yes    Plan:  Restart IV heparin drip at 1000 units/hr Give Warfarin 4 mg po today x1 Daily HL, INR, and CBC Monitor for bleeding  Erin Hearing PharmD., BCPS Clinical Pharmacist 10/28/2020 2:17 PM

## 2020-10-28 NOTE — Progress Notes (Signed)
[place] for IV Heparin >> Warfarin Indication: h/o DVT/PE, Protein C deficiency  No Known Allergies  Patient Measurements: Weight: 91 kg (200 lb 9.9 oz) Heparin Dosing Weight: 80 kg  Vital Signs: Temp: 98.9 F (37.2 C) (05/11 2107) Temp Source: Oral (05/11 2107) BP: 145/50 (05/11 2107) Pulse Rate: 78 (05/11 2107)  Labs: Recent Labs    10/26/20 0721 10/26/20 1726 10/27/20 0309 10/27/20 1202 10/28/20 0507 10/28/20 2305  HGB 8.8*  --  9.5*  --  7.5*  --   HCT 27.6*  --  30.2*  --  23.5*  --   PLT 203  --  177  --  130*  --   LABPROT 17.4*  --  17.0*  --  15.8*  --   INR 1.4*  --  1.4*  --  1.3*  --   HEPARINUNFRC 0.65   < > 0.68 0.41  --  0.10*  CREATININE 8.90*  --  4.58*  --  6.58*  --    < > = values in this interval not displayed.    Estimated Creatinine Clearance: 7.6 mL/min (A) (by C-G formula based on SCr of 6.58 mg/dL (H)).   Medical History: Past Medical History:  Diagnosis Date  . CHF (congestive heart failure) ([place])   . Diabetes mellitus without complication ([place])   . ESRD (end stage renal disease) on dialysis ([place])   . History of pituitary adenoma   . Hypertension   . Protein C deficiency [facility])     Assessment: 80 y.o. female on HD on warfarin PTA for hx PE/DVT and protein C deficiency; supratherapeutic INR at 5.2 on admission 10/22/20.  Warfarin was placed on hold, and pt was started on IV Heparin. Heparin infusion was stopped 5/8 and patient was taken emergently to OR for bleeding AVG, ulceration of left thigh AV graft, S/P revision 5/8.  Protamine 30 mg IV given on 5/8.   PTA Warfarin dose:  Outpatient pharmacy reports last filled (LF) dates for warfarin as follows:  Warfarin 2 mg LF date 08/19/20 and 5 mg LF date 08/06/20 (have attempted to confirm home medication /dosage with patient's family member and this is still in process).  Last hospitalization note in Epic indicated: Warfarin 5 mg daily per discharge  summary on 03/26/2020.   Heparin stopped 5/10 afternoon after nursing reported bleeding from left thigh graft. Per vascular bleeding from cannulation site and no bleeding from surgical site revision and bleeding has resolved. Heparin and warfarin restarted 5/11 pm.   Heparin restart was delayed due to blood being given and only one IV line. Heparin level 0.1 is inaccurate as drawn ~1hr post heparin restart.   Goal of Therapy:  Heparin level 0.3-0.7 units/ml Monitor platelets by anticoagulation protocol: Yes    Plan:  Continue IV heparin drip at 1000 units/hr F/u 8 hr heparin level  Sherlon Handing, PharmD, BCPS Please see amion for complete clinical pharmacist phone list 10/28/2020 11:33 PM

## 2020-10-28 NOTE — Progress Notes (Signed)
Subjective:  HD6  Patient evaluated at bedside this AM. Ashlee Miles says she is not feeling well this morning due to continued abdominal pain. She states it radiates up from her abdomen into her chest. She says she takes oxycodone 3 times daily at home although sometimes will take a fourth dose if needed. She confirms she did have some bleeding during her dialysis session yesterday. She says "God told her she wasn't supposed to be moving" and "knew she shouldn't have moved" and is concerned she "was treated wrong". She says placing cream on her site before dialysis makes her arm hurt less.   Objective:  Vital signs in last 24 hours: Vitals:   10/27/20 1613 10/27/20 1932 10/27/20 2309 10/28/20 0445  BP: (!) 114/38 (!) 128/37 (!) 110/36 (!) 112/41  Pulse: 73 74 65 70  Resp: '16 17 13 12  '$ Temp: 98.4 F (36.9 C) 98.5 F (36.9 C) 98.2 F (36.8 C) 98.3 F (36.8 C)  TempSrc: Oral Oral Oral Oral  SpO2: 100% 100% 100% 99%  Weight:    92 kg   Physical Exam: General: Laying in bed, no acute distress, appears tired CV: Regular rate, rhythm. Systolic murmur present.  Pulm: Normal work of breathing without use accessory muscles. Abdomen: Soft, non-distended. Exquisitely tender throughout. Skin: Graft site appears in tact. Cannulation sites with bandage, no bleeding or drainage visible.  CBC Latest Ref Rng & Units 10/28/2020 10/27/2020 10/26/2020  WBC 4.0 - 10.5 K/uL 7.1 8.4 6.1  Hemoglobin 12.0 - 15.0 g/dL 7.5(L) 9.5(L) 8.8(L)  Hematocrit 36.0 - 46.0 % 23.5(L) 30.2(L) 27.6(L)  Platelets 150 - 400 K/uL 130(L) 177 203   BMP Latest Ref Rng & Units 10/28/2020 10/27/2020 10/26/2020  Glucose 70 - 99 mg/dL 115(H) 168(H) 161(H)  BUN 8 - 23 mg/dL 27(H) 16 39(H)  Creatinine 0.44 - 1.00 mg/dL 6.58(H) 4.58(H) 8.90(H)  Sodium 135 - 145 mmol/L 135 136 138  Potassium 3.5 - 5.1 mmol/L 4.3 4.2 5.1  Chloride 98 - 111 mmol/L 97(L) 100 102  CO2 22 - 32 mmol/L '30 26 25  '$ Calcium 8.9 - 10.3 mg/dL 7.8(L) 7.5(L) 7.9(L)    Assessment/Plan: Ashlee Miles is 80yo person living with ESRD on MWF HD, HFpEF, T2DM, protein C deficiency with prior DVT on Coumadin admitted 5/5 with AV graft ulceration, now POD3 from revision with bleeding incident from cannulation sites yesterday and continued abdominal pain.  Principal Problem:   Complication of vascular access for dialysis Active Problems:   Essential hypertension   Chronic diastolic CHF (congestive heart failure) (H[facility])   ESRD on dialysis ([place])   Anemia due to chronic kidney disease   Epigastric pain   Diabetes mellitus with end stage renal disease ([facility])   Protein C deficiency ([facility])   Abdominal pain  #AV graft ulceration, POD3 s/p revision #Proetein C deficiency with prior DVT Yesterday patient had bleeding incident from cannulation sites. Surgical graft site clean, dry, intact. At that time heparin was stopped, INR this AM 1.3. Will plan to re-start heparin this evening along with Coumadin. Goal INR 2-3 prior to discharge. Appreciate vascular surgery and pharmacy recommendations and assistance. - Re-start heparin gtt this evening - Warfarin per pharmacy - Goal INR 2-3 - Daily CBC, INR  #ESRD on MWF Patient continues to appear euvolemic without electrolyte abnormalities on labs. Plan for HD today. Appreciate nephrology assistance. - HD today - Lidocaine cream prior to HD - C/w binder  #Acute on chronic anemia Hgb 7.5 this AM from 9.5 yesterday  after bleeding event. Will plan to transfuse today with dialysis given poor vasculature and acute bleed yesterday. Anticipate transfusion would also help her feel better overall. Plan for Mircera 5/3 per nephrology - Transfuse 1u pRBC w/ HD - F/u post-transfusion H/H - Daily CBC - Mircera 5/13  #Diffuse abdominal pain #Chronic opioid use disorder CTA negative for mesenteric ischemia. Most likely pain 2/2 mild withdrawal from chronic opioid use. Will start oral dilaudid today with IV breakthrough pain. Will also  continue dyspepsia medications for now, can consider d/c famotidine if increased opioid medication resolves pain. - po dilaudid '4mg'$  q8h - IV dilaudid '1mg'$  q8h PRN breakthrough - Pantoprazole '40mg'$  BID - Famotidine '20mg'$  QHS  #Type 2 DM CBG's appropriate, will continue to monitor as patient is able to start eating.  - CBG monitoring - SSI  #HFpEF #HTN BP and volume control w/ HD. No acute changes, hemodynamically stable.  DIET: CM IVF: n/a DVT PPX: warfarin, re-start heparin this PM BOWEL: n/a CODE: DNR/DNI FAM COM: n/a  Prior to Admission Living Arrangement: Home Anticipated Discharge Location: Home Barriers to Discharge: medical management Dispo: Anticipated discharge in approximately 2-3 day(s).   [name], MD 10/28/2020, 6:27 AM Pager: [phone] After 5pm on weekdays and 1pm on weekends: On Call pager [phone]

## 2020-10-28 NOTE — Progress Notes (Signed)
  Progress Note    10/28/2020 8:17 AM 3 Days Post-Op  Subjective:  Says her thigh hurts  afebrile  Vitals:   10/27/20 2309 10/28/20 0445  BP: (!) 110/36 (!) 112/41  Pulse: 65 70  Resp: 13 12  Temp: 98.2 F (36.8 C) 98.3 F (36.8 C)  SpO2: 100% 99%    Physical Exam: General:  Sleeping-wakes easily Lungs:  Non labored Incisions:  Both incisions are clean and dry; ulceration site under bandage is clean.  Extremities:  +thrill in graft and easily palpable.  No further bleeding from cannulation site   CBC    Component Value Date/Time   WBC 7.1 10/28/2020 0507   RBC 2.83 (L) 10/28/2020 0507   HGB 7.5 (L) 10/28/2020 0507   HCT 23.5 (L) 10/28/2020 0507   PLT 130 (L) 10/28/2020 0507   MCV 83.0 10/28/2020 0507   MCH 26.5 10/28/2020 0507   MCHC 31.9 10/28/2020 0507   RDW 16.5 (H) 10/28/2020 0507   LYMPHSABS 1.5 10/22/2020 1259   MONOABS 0.6 10/22/2020 1259   EOSABS 0.3 10/22/2020 1259   BASOSABS 0.0 10/22/2020 1259    BMET    Component Value Date/Time   NA 135 10/28/2020 0507   K 4.3 10/28/2020 0507   CL 97 (L) 10/28/2020 0507   CO2 30 10/28/2020 0507   GLUCOSE 115 (H) 10/28/2020 0507   BUN 27 (H) 10/28/2020 0507   CREATININE 6.58 (H) 10/28/2020 0507   CALCIUM 7.8 (L) 10/28/2020 0507   CALCIUM 7.8 (L) 03/13/2017 1559   GFRNONAA 6 (L) 10/28/2020 0507   GFRAA 6 (L) 03/24/2020 0500    INR    Component Value Date/Time   INR 1.3 (H) 10/28/2020 0507     Intake/Output Summary (Last 24 hours) at 10/28/2020 0817 Last data filed at 10/27/2020 1700 Gross per 24 hour  Intake 352.61 ml  Output --  Net 352.61 ml     Assessment:  80 y.o. female is s/p:  S/p revision of left thigh AVG with interposition graft  3 Days Post-Op  Plan: -bleeding from cannulation site yesterday and no bleeding from surgical revision site.  This has resolved.  The graft has a good thrill.   -DVT prophylaxis:  Heparin is on hold currently.     Leontine Locket, PA-C [facility] [phone] 10/28/2020 8:17 AM

## 2020-10-29 LAB — TYPE AND SCREEN
ABO/RH(D): B POS
Antibody Screen: NEGATIVE
Unit division: 0

## 2020-10-29 LAB — BPAM RBC
Blood Product Expiration Date: 202206012359
ISSUE DATE / TIME: 202205111655
Unit Type and Rh: 7300

## 2020-10-29 LAB — GLUCOSE, CAPILLARY
Glucose-Capillary: 77 mg/dL (ref 70–99)
Glucose-Capillary: 122 mg/dL — ABNORMAL HIGH (ref 70–99)
Glucose-Capillary: 112 mg/dL — ABNORMAL HIGH (ref 70–99)
Glucose-Capillary: 156 mg/dL — ABNORMAL HIGH (ref 70–99)

## 2020-10-29 LAB — RENAL FUNCTION PANEL
Albumin: 2.4 g/dL — ABNORMAL LOW (ref 3.5–5.0)
Anion gap: 8 (ref 5–15)
BUN: 14 mg/dL (ref 8–23)
CO2: 29 mmol/L (ref 22–32)
Calcium: 7.6 mg/dL — ABNORMAL LOW (ref 8.9–10.3)
Chloride: 99 mmol/L (ref 98–111)
Creatinine, Ser: 4.2 mg/dL — ABNORMAL HIGH (ref 0.44–1.00)
GFR, Estimated: 10 mL/min — ABNORMAL LOW (ref >60)
Glucose, Bld: 72 mg/dL (ref 70–99)
Phosphorus: 3.7 mg/dL (ref 2.5–4.6)
Potassium: 3.8 mmol/L (ref 3.5–5.1)
Sodium: 136 mmol/L (ref 135–145)

## 2020-10-29 LAB — CBC
HCT: 27.6 % — ABNORMAL LOW (ref 36.0–46.0)
Hemoglobin: 9.1 g/dL — ABNORMAL LOW (ref 12.0–15.0)
MCH: 27.3 pg (ref 26.0–34.0)
MCHC: 33 g/dL (ref 30.0–36.0)
MCV: 82.9 fL (ref 80.0–100.0)
Platelets: 140 10*3/uL — ABNORMAL LOW (ref 150–400)
RBC: 3.33 MIL/uL — ABNORMAL LOW (ref 3.87–5.11)
RDW: 15.4 % (ref 11.5–15.5)
WBC: 7.3 10*3/uL (ref 4.0–10.5)
nRBC: 0 % (ref 0.0–0.2)

## 2020-10-29 LAB — PROTIME-INR
INR: 1.2 (ref 0.8–1.2)
Prothrombin Time: 15.6 seconds — ABNORMAL HIGH (ref 11.4–15.2)

## 2020-10-29 LAB — HEPARIN LEVEL (UNFRACTIONATED): Heparin Unfractionated: 0.36 IU/mL (ref 0.30–0.70)

## 2020-10-29 MED ORDER — HYDROMORPHONE HCL 1 MG/ML IJ SOLN
0.5000 mg | Freq: Once | INTRAMUSCULAR | Status: AC
  Administered 2020-10-29: 0.5 mg via INTRAVENOUS
  Filled 2020-10-29: qty 1

## 2020-10-29 MED ORDER — DARBEPOETIN ALFA 150 MCG/0.3ML IJ SOSY
150.0000 ug | PREFILLED_SYRINGE | INTRAMUSCULAR | Status: DC
  Filled 2020-10-29: qty 0.3

## 2020-10-29 MED ORDER — WARFARIN SODIUM 4 MG PO TABS
4.0000 mg | ORAL_TABLET | Freq: Once | ORAL | Status: AC
  Administered 2020-10-29: 4 mg via ORAL
  Filled 2020-10-29: qty 1

## 2020-10-29 NOTE — Evaluation (Signed)
Physical Therapy Evaluation Patient Details Name: Ashlee Miles MRN: [id number] DOB: [birth year] Today's Date: 10/29/2020   History of Present Illness  Ashlee Miles is 80yo person living with ESRD on MWF HD, HFpEF, T2DM, protein C deficiency, hx DVT/PE on Coumadin presenting for evaluation of fistula due to profuse bleeding from her left thigh fistula. Per chart review, she was evaluated in the ED on 5/1 for this issue. Pt missing a few HD sessions.  Denies fevers, chills, chest pain, dyspnea, cough, nausea, vomiting, abdominal pain, constipation, dysuria; an episode of diarrhea last week  Clinical Impression  Pt admitted with/for profuse bleeding from her left thigh fistula.  Pt presently needing light moderate to maximal assist, having been mostly immobile for days and due to L LE pain.Marland Kitchen  Pt currently limited functionally due to the problems listed. ( See problems list.)   Pt will benefit from PT to maximize function and safety in order to get ready for next venue listed below.     Follow Up Recommendations SNF;Supervision/Assistance - 24 hour    Equipment Recommendations  None recommended by PT;Other (comment) (TBA next venue)    Recommendations for Other Services       Precautions / Restrictions Precautions Precautions: Fall      Mobility  Bed Mobility Overal bed mobility: Needs Assistance Bed Mobility: Supine to Sit;Sit to Supine     Supine to sit: Mod assist;Max assist Sit to supine: Max assist   General bed mobility comments: mod A to use rails, max A to use pads to scoot hips around and to EOB, max A with LEs back onto bed. pt required encouragement and increased time to initiate.    Transfers Overall transfer level: Needs assistance Equipment used: Rolling walker (2 wheeled) Transfers: Sit to/from [facility] Sit to Stand: Mod assist Stand pivot transfers: Mod assist       General transfer comment: cues for hand placement and assist to boost and  also come forward.  Ambulation/Gait Ambulation/Gait assistance: Min assist;Mod assist Gait Distance (Feet): 3 Feet Assistive device: Rolling walker (2 wheeled) Gait Pattern/deviations: Step-to pattern   Gait velocity interpretation: <1.31 ft/sec, indicative of household ambulator General Gait Details: side stepping to get closer to [facility] before sitting.  Stairs            Wheelchair Mobility    Modified Rankin (Stroke Patients Only)       Balance Overall balance assessment: Needs assistance Sitting-balance support: No upper extremity supported;Feet supported Sitting balance-[name]: Fair     Standing balance support: Bilateral upper extremity supported;During functional activity Standing balance-[name]: Poor                               Pertinent Vitals/Pain Pain Assessment: No/denies pain    Home Living Family/patient expects to be discharged to:: Skilled nursing facility Living Arrangements: Children                    Prior Function Level of Independence: Independent with assistive device(s);Needs assistance   Gait / Transfers Assistance Needed: uses rollator for mobility  ADL's / Homemaking Assistance Needed: assist from aide with LB bathing and dressing  Comments: Uses rollator for ambulation; has aide to assist her with bathing and dressing. Pt confused, providing conflicting info about PLOF     Hand Dominance   Dominant Hand: Right    Extremity/Trunk Assessment   Upper Extremity Assessment Upper Extremity Assessment: Defer  to OT evaluation    Lower Extremity Assessment Lower Extremity Assessment: RLE deficits/detail;LLE deficits/detail RLE Deficits / Details: generally weak, but functional RLE Coordination: decreased fine motor LLE Deficits / Details: painful, stiff, generally weak.  Pt unable to lift against gravity without assist due to ant thigh incisional pain.       Communication   Communication: No  difficulties  Cognition Arousal/Alertness: Awake/alert Behavior During Therapy: WFL for tasks assessed/performed Overall Cognitive Status: No family/caregiver present to determine baseline cognitive functioning                                 General Comments: pt with hx of cognitive impairments, but baseline unknown at this time. Pt confused and giving conflicitng answers with PLOF      General Comments General comments (skin integrity, edema, etc.): vss    Exercises     Assessment/Plan    PT Assessment Patient needs continued PT services  PT Problem List Decreased strength;Decreased activity tolerance;Decreased balance;Decreased mobility;Decreased knowledge of use of DME;Decreased cognition;Pain;Decreased safety awareness       PT Treatment Interventions DME instruction;Gait training;Functional mobility training;Therapeutic activities;Balance training;Patient/family education    PT Goals (Current goals can be found in the Care Plan section)  Acute Rehab PT Goals Patient Stated Goal: go homem, Family wishes pt to get some rehab at [facility] before coming home. PT Goal Formulation: With patient Time For Goal Achievement: 11/12/20 Potential to Achieve Goals: Good    Frequency Min 3X/week   Barriers to discharge        Co-evaluation               AM-PAC PT "6 Clicks" Mobility  Outcome Measure Help needed turning from your back to your side while in a flat bed without using bedrails?: A Lot Help needed moving from lying on your back to sitting on the side of a flat bed without using bedrails?: A Lot Help needed moving to and from a bed to a chair (including a wheelchair)?: A Lot Help needed standing up from a chair using your arms (e.g., wheelchair or bedside chair)?: A Lot Help needed to walk in hospital room?: A Lot Help needed climbing 3-5 steps with a railing? : A Lot 6 Click Score: 12    End of Session   Activity Tolerance: Patient tolerated  treatment well;Patient limited by fatigue (not feeling well.) Patient left: in bed;with call bell/phone within reach;with bed alarm set Nurse Communication: Mobility status PT Visit Diagnosis: Unsteadiness on feet (R26.81);Other abnormalities of gait and mobility (R26.89);Muscle weakness (generalized) (M62.81);Difficulty in walking, not elsewhere classified (R26.2);Pain Pain - Right/Left: Left Pain - part of body: Leg    Time: 1415-1448 PT Time Calculation (min) (ACUTE ONLY): 33 min   Charges:   PT Evaluation $PT Eval Moderate Complexity: 1 Mod PT Treatments $Therapeutic Activity: 8-22 mins        10/29/2020  Ginger Carne., PT Acute Rehabilitation Services [phone]  (pager) [phone]  (office)  Tessie Fass Vernida Mcnicholas 10/29/2020, 4:11 PM

## 2020-10-29 NOTE — Progress Notes (Signed)
Mobility Specialist: Progress Note   10/29/20 1653  Mobility  Activity Refused mobility   Pt refused mobility due to RLE pain. Pt said none of the pain medications are helping her either. Will f/u tomorrow.   [facility] Cordai Rodrigue Mobility Specialist Mobility Specialist Phone: [phone]

## 2020-10-29 NOTE — Progress Notes (Addendum)
[facility] Progress Note   Subjective:  Seen in room. Completed dialysis yesterday without issues.  No further bleeding. No new complaints.  Hasn't got up with PT yet.    Objective Vitals:   10/28/20 2341 10/29/20 0359 10/29/20 0608 10/29/20 0744  BP: (!) 158/50 (!) 156/48  106/77  Pulse: 77 74  73  Resp: '18 15  20  '$ Temp: 98.4 F (36.9 C) 98.7 F (37.1 C)  98.6 F (37 C)  TempSrc: Oral Oral  Oral  SpO2: 100% 100%  100%  Weight:   92.1 kg    Physical Exam General: Well appearing woman, NAD. Room air. Heart: RRR; no murmur Lungs: CTAB Abdomen: soft Extremities: No LE edema Dialysis Access: L thigh AVG, + bruit.; very tender to palpation   Additional Objective Labs: Basic Metabolic Panel: Recent Labs  Lab 10/27/20 0309 10/28/20 0507 10/29/20 0500  NA 136 135 136  K 4.2 4.3 3.8  CL 100 97* 99  CO2 '26 30 29  '$ GLUCOSE 168* 115* 72  BUN 16 27* 14  CREATININE 4.58* 6.58* 4.20*  CALCIUM 7.5* 7.8* 7.6*  PHOS 3.2 4.6 3.7   Liver Function Tests: Recent Labs  Lab 10/24/20 0934 10/26/20 0721 10/27/20 0309 10/28/20 0507 10/29/20 0500  AST 18  --   --   --   --   ALT 9  --   --   --   --   ALKPHOS 44  --   --   --   --   BILITOT 0.7  --   --   --   --   PROT 5.6*  --   --   --   --   ALBUMIN 2.9*   < > 2.7* 2.2* 2.4*   < > = values in this interval not displayed.   Recent Labs  Lab 10/24/20 0934  LIPASE 24   CBC: Recent Labs  Lab 10/22/20 1259 10/23/20 0611 10/25/20 1535 10/26/20 0721 10/27/20 0309 10/28/20 0507 10/29/20 0500  WBC 6.8   < > 7.5 6.1 8.4 7.1 7.3  NEUTROABS 4.3  --   --   --   --   --   --   HGB 7.8*   < > 9.7* 8.8* 9.5* 7.5* 9.1*  HCT 25.1*   < > 29.1* 27.6* 30.2* 23.5* 27.6*  MCV 84.5   < > 80.8 82.4 84.6 83.0 82.9  PLT 216   < > 192 203 177 130* 140*   < > = values in this interval not displayed.   Medications: . heparin 1,000 Units/hr (10/28/20 2132)   . calcitRIOL  1 mcg Oral Q M,W,F-HD  . Chlorhexidine  Gluconate Cloth  6 each Topical Daily  . Chlorhexidine Gluconate Cloth  6 each Topical Q0600  . cinacalcet  30 mg Oral Q M,W,F-HD  . famotidine  20 mg Oral Daily  . gabapentin  100 mg Oral QHS  .  HYDROmorphone (DILAUDID) injection  0.5 mg Intravenous Once  . HYDROmorphone  4 mg Oral Q8H  . insulin aspart  0-6 Units Subcutaneous TID WC  . lidocaine-prilocaine   Topical QODAY  . pantoprazole  40 mg Oral BID AC  . sertraline  50 mg Oral Daily  . sevelamer carbonate  800 mg Oral TID WC  . simvastatin  40 mg Oral q1800  . sodium chloride flush  10-40 mL Intracatheter Q12H  . sodium chloride flush  3 mL Intravenous Q12H  . warfarin  4 mg Oral ONCE-1600  .  Warfarin - Pharmacist Dosing Inpatient   Does not apply [id number]    Dialysis Orders: MWF NW 4h 4/00/500 90.5kg 3K/2.5 bath Hep none L thigh AVG - mircera 100 q2 last 4/29, due 5/13 - calcitriol 1 ug po tiw  Assessment/Plan: 1. AV graft eschar --- w/ recent significant bleeding episode at home. VVS following --s/p revision of left thigh AVG on 5/8. Dr. Carlis Abbott.   2. Hyperkalemia - resolved s/p HD 3. ESRD -- Usual MWF schedule,Back on schedule Next HD 5/13.  4. BP/ volume-- Variable BP-BP drops on HD. No meds here. No gross volume on exam. UF to EDW as tolerated  5. Anemia of ESRD/ABLA  ---Hgb 6.8 on admit.  Hgb 9.1 s/p 1U PRBCs on 5/6. ESA due on 5/13 -will order.  6. MBD-- Corr Ca/Phos ok. Continue binder, VDRA/sensipar q HD 7. DM2 --- per pmd 8. Prot C deficiency/Hx DVT/PE -- On AC. Heparin/warfarin per pharmacy    [place] 10/29/2020,11:08 AM   Seen and examined independently this am.  Agree with note and exam as documented above by physician extender and as noted here.  Feels well.  As of my exam this am no issues with bleeding today and she states no trouble with bleeding after dialysis yesterday. States dialysis went ok. Left leg still very tender.   General adult female in  bed in no acute distress HEENT normocephalic atraumatic extraocular movements intact sclera anicteric Neck supple trachea midline Lungs clear to auscultation bilaterally normal work of breathing at rest; on room air  Heart S1S2 no rub Abdomen soft nontender nondistended Extremities no edema; tender over left thigh Psych normal mood and affect Neuro alert and oriented x 3 provides hx and follows commands Access: left thigh graft with bruit; tender  AV graft eschar with bleeding - s/p emergent revision - appreciate vascular.   ESRD - on HD per MWF schedule - next tx tomorrow. Sticking medial aspect of graft.   Hypotension resolved  Anemia - acute blood loss and CKD. S/p PRBC's this admit. will order aranesp 150 mcg for tomorrow and every Friday for now.   Claudia Desanctis, MD 10/29/2020  12:04 PM

## 2020-10-29 NOTE — Progress Notes (Signed)
Subjective:  HD7  Ashlee Miles states that she "wouldn't be able to tell us" how her abdomen feels although says she continues to have pain. She says she feels as though she'll start crying and is having a hard time being in the hospital. She does not noticed feeling different after getting blood yesterday. She said she used to feel very good when she completes her dialysis sessions. She has been eating okay and denies any further bleeding. She says she doesn't feel like doing anything right now and doesn't want PT to come see her. When asked if she's feeling down, she says "I don't know what to say". She denies SI/HI.   Objective:  Vital signs in last 24 hours: Vitals:   10/28/20 2107 10/28/20 2341 10/29/20 0359 10/29/20 0608  BP: (!) 145/50 (!) 158/50 (!) 156/48   Pulse: 78 77 74   Resp: '16 18 15   '$ Temp: 98.9 F (37.2 C) 98.4 F (36.9 C) 98.7 F (37.1 C)   TempSrc: Oral Oral Oral   SpO2: 100% 100% 100%   Weight:    92.1 kg   Physical Exam: General: Laying in bed, no acute distress, appears improved from yesterday CV: Regular rate, rhythm. Systolic murmur present. Pulm: Normal work of breathing on room air Abdomen: Soft, non-distended. Tender throughout.  Skin: Surgical sites appear in tact, no drainage or bleeding visible. Psych: Flat affect, anhedonia. normal speech.  Assessment/Plan: Ashlee Miles is 80yo person living with ESRD on MWF HD, HFpEF, T2DM, protein C deficiency with prior DVT on Coumadin at home admitted 5/5 with AV graft ulceration, now POD4 from revision, bridging anticoagulants for goal INR.  Principal Problem:   Complication of vascular access for dialysis Active Problems:   Essential hypertension   Chronic diastolic CHF (congestive heart failure) (HCC)   ESRD on dialysis ([place])   Anemia due to chronic kidney disease   Epigastric pain   Diabetes mellitus with end stage renal disease (HCC)   Protein C deficiency (HCC)   Abdominal pain  #AV graft  ulceration, POD4 s/p revision #Protein C deficiency with prior DVT Surgical sites appear to be clean and in tact. Heparin was re-started yesterday afternoon, INR 1.2 and heparin level at goal this AM. Will continue with heparin, warfarin with goal INR 2-3. Appreciate vascular surgery and pharmacy. - Heparin gtt - Warfarin per pharmacy - Goal INR 2-3 - Daily CBC, INR, heparin level  #ESRD on MWF HD Patient underwent HD yesterday, appears euvolemic on exam this morning. Appreciate nephrology assistance. Will need to coordinate with SNF to ensure she can still get to HD once stable and ready for discharge. - HD tomorrow - Lidocaine cream prior to HD - C/w binder  #Acute on chronic anemia Hgb 9.1 today s/p 1u pRBC yesterday. No further episodes of bleeding per patient, no signs of bleeding on exam. Patient appears clinically improved today with blood transfusion, hopeful she has no further episodes of bleeding. Mircera scheduled for tomorrow. - Daily CBC - Mircera tomorrow  #Diffuse abdominal pain #Chronic opioid use disorder  Patient continues to have diffuse abdominal pain, although reports no other GI symptoms. Eating some, but not much. Likely abdominal pain 2/2 opioid withdrawal. Hopeful abdominal pain improves with this current regimen. - PO dilaudid '4mg'$  q8h - IV dilaudid '1mg'$  q8h PRN breakthrough - Protonix '40mg'$  BID - Famotidine '20mg'$  QHS  #Type 2 DM CBG's appropriate, will continue to monitor. - CBG monitoring - SSI  #Deconditioning Per patient's daughter, patient has  been declining functionally recently. PT/OT evaluation earlier today suggesting SNF. Will work with social work for placement, continue with PT/OT. - PT/OT  #Anhedonia This morning expressing feelings of hopelessness, although denying SI/HI. Unfortunately it sounds like family members are unable to come see her today. Can consider starting SSRI, will continue to monitor and discuss with Ashlee Miles.   DIET:  CM IVF: n/a DVT PPX: warfarin, heparin BOWEL: n/a CODE: DNR/DNI FAM COM: n/a  Prior to Admission Living Arrangement: Home Anticipated Discharge Location: Home Barriers to Discharge: medical management Dispo: Anticipated discharge in approximately 4-5 day(s).   [name], MD 10/29/2020, 7:26 AM Pager: [phone] After 5pm on weekdays and 1pm on weekends: On Call pager [phone]

## 2020-10-29 NOTE — Evaluation (Signed)
Occupational Therapy Evaluation Patient Details Name: Ashlee Miles MRN: [id number] DOB: [birth year] Today's Date: 10/29/2020    History of Present Illness Ashlee Miles is 80yo person living with ESRD on MWF HD, HFpEF, T2DM, protein C deficiency, hx DVT/PE on Coumadin presenting for evaluation of fistula due to profuse bleeding from her left thigh fistula. Per chart review, she was evaluated in the ED on 5/1 for this issue. Pt missing a few HD sessions.  Denies fevers, chills, chest pain, dyspnea, cough, nausea, vomiting, abdominal pain, constipation, dysuria; an episode of diarrhea last week   Clinical Impression   Pt presents with decline in function and safety with ADLs and ADL mobility with impaired strength, balance and endurance; pt with hx of cognitive impairments. Pt confused and giving conflicting reports about PLOF. PLOF info obtained from admission notes and previous chart. PTA pt lived at home with he daughter, son in law and 2 grand children. Pt used a RW for mobility and had assist from a PCA for LB ADLs but could groom, toilet and feed herself. Pt currently requires mod/max A for bed mobility to sit EOB, mod A to stand at RW to transfer to [facility], max A with LB ADLs, min guard A with UB selfcare and grooming seated, pt stood at [facility] with RW to manage clothing mod A and peri hygiene min A. Pt would benefit from acute OT services to address impairments to maximize level of function and safety    Follow Up Recommendations  SNF    Equipment Recommendations  Other (comment) (TBD at next venue of care)    Recommendations for Other Services PT consult     Precautions / Restrictions Restrictions Weight Bearing Restrictions: No      Mobility Bed Mobility Overal bed mobility: Needs Assistance Bed Mobility: Supine to Sit;Sit to Supine     Supine to sit: Mod assist;Max assist Sit to supine: Max assist   General bed mobility comments: mod A to use rails, max A to use pads to scoot  hips around and to EOB, max A with LEs back onto bed. pt required increased time to initiate and complete due to being easily dirtsracted and hyperverbose    Transfers Overall transfer level: Needs assistance Equipment used: Rolling walker (2 wheeled) Transfers: Sit to/from [facility] Sit to Stand: Mod assist Stand pivot transfers: Mod assist            Balance Overall balance assessment: Needs assistance Sitting-balance support: No upper extremity supported;Feet supported Sitting balance-[name]: Fair     Standing balance support: Bilateral upper extremity supported;During functional activity Standing balance-[name]: Poor                             ADL either performed or assessed with clinical judgement   ADL Overall ADL's : Needs assistance/impaired Eating/Feeding: Set up;Independent;Sitting   Grooming: Wash/dry hands;Wash/dry face;Min guard;Sitting   Upper Body Bathing: Min guard;Sitting   Lower Body Bathing: Maximal assistance   Upper Body Dressing : Min guard;Sitting   Lower Body Dressing: Maximal assistance   Toilet Transfer: Moderate assistance;RW;Ambulation;Stand-pivot;BSC;Cueing for safety;Cueing for sequencing   Toileting- Clothing Manipulation and Hygiene: Moderate assistance;Sit to/from stand Toileting - Clothing Manipulation Details (indicate cue type and reason): mod A with clothing mgt, min A with anterior peri hygiene standing at RW     Functional mobility during ADLs: Moderate assistance;Rolling walker;Cueing for safety;Cueing for sequencing       Vision Baseline  Vision/History: Wears glasses Wears Glasses: At all times Patient Visual Report: No change from baseline       Perception     Praxis      Pertinent Vitals/Pain Pain Assessment: No/denies pain     Hand Dominance Right   Extremity/Trunk Assessment Upper Extremity Assessment Upper Extremity Assessment: Generalized weakness   Lower  Extremity Assessment Lower Extremity Assessment: Defer to PT evaluation       Communication Communication Communication: No difficulties   Cognition Arousal/Alertness: Awake/alert Behavior During Therapy: WFL for tasks assessed/performed Overall Cognitive Status: No family/caregiver present to determine baseline cognitive functioning                                 General Comments: pt with hx of cognitive impairments, but baseline unknown at this time. Pt confused and giving conflicitng answers with PLOF questions and stated that she didn't know where she lived when asked   General Comments       Exercises     Shoulder Instructions      Home Living Family/patient expects to be discharged to:: Skilled nursing facility Living Arrangements: Children                                      Prior Functioning/Environment Level of Independence: Independent with assistive device(s);Needs assistance  Gait / Transfers Assistance Needed: uses rollate for mobility ADL's / Homemaking Assistance Needed: assist from aide with LB bathing and dressing   Comments: Uses rollator for ambulation; has aide to assist her with bathing and dressing. Pt confused, providing conflicting info about PLOF        OT Problem List: Decreased strength;Impaired balance (sitting and/or standing);Decreased cognition;Decreased safety awareness;Decreased activity tolerance;Decreased knowledge of use of DME or AE      OT Treatment/Interventions: Self-care/ADL training;Therapeutic exercise;Patient/family education;Therapeutic activities;DME and/or AE instruction    OT Goals(Current goals can be found in the care plan section) Acute Rehab OT Goals Patient Stated Goal: go home OT Goal Formulation: With patient Time For Goal Achievement: 11/12/20 Potential to Achieve Goals: Good ADL Goals Pt Will Perform Grooming: with supervision;with set-up;sitting Pt Will Perform Upper Body  Bathing: with supervision;with set-up;sitting Pt Will Perform Lower Body Bathing: with mod assist;with min assist;sitting/lateral leans Pt Will Perform Upper Body Dressing: with set-up;with supervision;sitting Pt Will Transfer to Toilet: with min assist;ambulating;stand pivot transfer Pt Will Perform Toileting - Clothing Manipulation and hygiene: with min assist;with min guard assist;sit to/from stand  OT Frequency: Min 2X/week   Barriers to D/C:            Co-evaluation              AM-PAC OT "6 Clicks" Daily Activity     Outcome Measure Help from another person eating meals?: None Help from another person taking care of personal grooming?: A Little Help from another person toileting, which includes using toliet, bedpan, or urinal?: A Lot Help from another person bathing (including washing, rinsing, drying)?: A Lot Help from another person to put on and taking off regular upper body clothing?: A Little Help from another person to put on and taking off regular lower body clothing?: A Lot 6 Click Score: 16   End of Session Equipment Utilized During Treatment: Gait belt;Rolling walker Nurse Communication: Mobility status  Activity Tolerance: Patient tolerated treatment well Patient left: in bed;with  call bell/phone within reach;with bed alarm set  OT Visit Diagnosis: Other abnormalities of gait and mobility (R26.89);Unsteadiness on feet (R26.81);Muscle weakness (generalized) (M62.81);Other symptoms and signs involving cognitive function                Time: [id number] OT Time Calculation (min): 32 min Charges:  OT Evaluation $OT Eval Moderate Complexity: 1 Mod OT Treatments $Self Care/Home Management : 8-22 mins    Britt Bottom 10/29/2020, 12:26 PM

## 2020-10-29 NOTE — Care Management Important Message (Signed)
Important Message  Patient Details  Name: Ashlee Miles MRN: [id number] Date of Birth: 1940/07/01   Medicare Important Message Given:  Yes     Verle Brillhart Montine Circle 10/29/2020, 2:41 PM

## 2020-10-29 NOTE — Progress Notes (Signed)
[place] for IV Heparin >> Warfarin Indication: h/o DVT/PE, Protein C deficiency  No Known Allergies  Patient Measurements: Weight: 92.1 kg (203 lb 0.7 oz) Heparin Dosing Weight: 80 kg  Vital Signs: Temp: 98.6 F (37 C) (05/12 0744) Temp Source: Oral (05/12 0744) BP: 106/77 (05/12 0744) Pulse Rate: 73 (05/12 0744)  Labs: Recent Labs    10/27/20 0309 10/27/20 1202 10/28/20 0507 10/28/20 2305 10/29/20 0500 10/29/20 0553  HGB 9.5*  --  7.5*  --  9.1*  --   HCT 30.2*  --  23.5*  --  27.6*  --   PLT 177  --  130*  --  140*  --   LABPROT 17.0*  --  15.8*  --   --  15.6*  INR 1.4*  --  1.3*  --   --  1.2  HEPARINUNFRC 0.68 0.41  --  0.10*  --  0.36  CREATININE 4.58*  --  6.58*  --  4.20*  --     Estimated Creatinine Clearance: 12 mL/min (A) (by C-G formula based on SCr of 4.2 mg/dL (H)).   Medical History: Past Medical History:  Diagnosis Date  . CHF (congestive heart failure) ([place])   . Diabetes mellitus without complication ([place])   . ESRD (end stage renal disease) on dialysis ([place])   . History of pituitary adenoma   . Hypertension   . Protein C deficiency [facility])     Assessment: 80 y.o. female on HD on warfarin PTA for hx PE/DVT and protein C deficiency; supratherapeutic INR at 5.2 on admission 10/22/20.  Warfarin was placed on hold, and pt was started on IV Heparin. Heparin infusion was stopped 5/8 and patient was taken emergently to OR for bleeding AVG, ulceration of left thigh AV graft, S/P revision 5/8.  Protamine 30 mg IV given on 5/8.   PTA Warfarin dose:  Outpatient pharmacy reports last filled (LF) dates for warfarin as follows:  Warfarin 2 mg LF date 08/19/20 and 5 mg LF date 08/06/20 (have attempted to confirm home medication /dosage with patient's family member and this is still in process).  Last hospitalization note in Epic indicated: Warfarin 5 mg daily per discharge summary on 03/26/2020.   Heparin stopped 5/10 afternoon  after nursing reported bleeding from left thigh graft. Per vascular bleeding from cannulation site and no bleeding from surgical site revision and bleeding has resolved. Heparin and warfarin restarted 5/11 pm.     Heparin level 0.36 on Heparin drip at 1000/hr,  therapeutic level.  Hgb improved to 9.1 post  blood transfusion on 5/11. with plt count 140.    INR = 1.2  No bleeding noted today per RN's report to me this morning.   Goal of Therapy:  Heparin level 0.3-0.7 units/ml Monitor platelets by anticoagulation protocol: Yes    Plan:  Continue IV heparin drip at 1000 units/hr Give Warfarin 4 mg today x1 Daily heparin level, INR and CBC  Thank you for allowing pharmacy to be part of this patients care team.  Nicole Cella, RPh Clinical Pharmacist [phone] Please see amion for complete clinical pharmacist phone list 10/29/2020 8:15 AM

## 2020-10-29 NOTE — NC FL2 (Signed)
[place] LEVEL OF CARE SCREENING TOOL     IDENTIFICATION  Patient Name: Ashlee Miles Birthdate: 19-Mar-1941 Sex: female Admission Date (Current Location): 10/22/2020  [facility] and [place] Number:  Herbalist and Address:  [facility] [place], [place] [address] [place], [place] [address]      Provider Number: [id number]  Attending Physician Name and Address:  Sid Falcon, MD  Relative Name and Phone Number:  [name] daughter, [phone]    Current Level of Care: Hospital Recommended Level of Care: [place] Prior Approval Number:    Date Approved/Denied:   PASRR Number: [id number]  Discharge Plan: SNF    Current Diagnoses: Patient Active Problem List   Diagnosis Date Noted  . Abdominal pain   . Complication of vascular access for dialysis   . S/P arteriovenous (AV) fistula repair 10/22/2020  . Skin necrosis ([place]) 04/28/2020  . AMS (altered mental status) 03/22/2020  . Hypoglycemia 03/22/2020  . Dyspnea 02/12/2019  . Memory loss 03/25/2018  . Generalized weakness 10/31/2017  . Palliative care encounter 10/31/2017  . Leg wound, left 07/12/2017  . Cellulitis of left lower extremity 07/12/2017  . HLD (hyperlipidemia) 07/12/2017  . Chest pain in adult 06/22/2017  . Elevated troponin   . Altered mental status   . Diabetes mellitus with end stage renal disease ([place]) 05/31/2017  . Depression 05/31/2017  . GERD (gastroesophageal reflux disease) 05/31/2017  . Left groin pain 05/31/2017  . Protein C deficiency ([place])   . 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 Falls 04/25/2017  . Hypotension 04/25/2017  . Epigastric pain 04/25/2017  . Constipation 04/25/2017  . ESRD on dialysis ([place]) 04/06/2017  . Anemia due to chronic kidney disease 04/06/2017  . Thrombocytopenia ([place]) 04/06/2017  . Cough in adult 04/06/2017  . DVT (deep venous thrombosis) ([place]) 04/05/2017  . Pressure injury of skin 03/14/2017  . Acute metabolic  encephalopathy [id number]  . Skin lesion of hand 03/12/2017  . TIA (transient ischemic attack) 03/01/2017  . Syncope 03/01/2017  . Diabetes mellitus with complication ([place]) [id number]  . Essential hypertension 03/01/2017  . Chronic diastolic CHF (congestive heart failure) ([place]) 03/01/2017    Orientation RESPIRATION BLADDER Height & Weight     Self,Time,Situation,Place  Normal Continent Weight: 203 lb 0.7 oz (92.1 kg) Height:     BEHAVIORAL SYMPTOMS/MOOD NEUROLOGICAL BOWEL NUTRITION STATUS      Continent Diet (Please see DC Summary)  AMBULATORY STATUS COMMUNICATION OF NEEDS Skin   Extensive Assist Verbally Surgical wounds (Closed incision on left thigh)                       Personal Care Assistance Level of Assistance  Bathing,Feeding,Dressing Bathing Assistance: Maximum assistance Feeding assistance: Limited assistance Dressing Assistance: Limited assistance     Functional Limitations Info  Sight,Hearing,Speech Sight Info: Impaired Hearing Info: Adequate Speech Info: Adequate    SPECIAL CARE FACTORS FREQUENCY  PT (By licensed PT),OT (By licensed OT)     PT Frequency: 5x/week OT Frequency: 5x/week            Contractures Contractures Info: Not present    Additional Factors Info  Code Status,Allergies,Isolation Precautions,Psychotropic,Insulin Sliding Scale Code Status Info: DNR Allergies Info: NKA Psychotropic Info: Zoloft Insulin Sliding Scale Info: See dc summary Isolation Precautions Info: MRSA     Current Medications (10/29/2020):  This is the current hospital active medication list Current Facility-Administered Medications  Medication Dose Route Frequency Provider Last Rate Last Admin  .  acetaminophen (TYLENOL) tablet 650 mg  650 mg Oral Q6H PRN [name], MD   650 mg at 10/27/20 2008  . albuterol (PROVENTIL) (2.5 MG/3ML) 0.083% nebulizer solution 2.5 mg  2.5 mg Nebulization Q4H PRN Rhyne, Samantha J, PA-C      . calcitRIOL (ROCALTROL)  capsule 1 mcg  1 mcg Oral Q M,W,F-HD Rhyne, Samantha J, PA-C   1 mcg at 10/28/20 0930  . Chlorhexidine Gluconate Cloth 2 % PADS 6 each  6 each Topical Daily Gabriel Earing, PA-C   6 each at 10/29/20 [id number]  . Chlorhexidine Gluconate Cloth 2 % PADS 6 each  6 each Topical Q0600 Gabriel Earing, PA-C   6 each at 10/29/20 0545  . cinacalcet (SENSIPAR) tablet 30 mg  30 mg Oral Q M,W,F-HD Rhyne, Samantha J, PA-C   30 mg at 10/28/20 1358  . [START ON 10/30/2020] Darbepoetin Alfa (ARANESP) injection 150 mcg  150 mcg Intravenous Q Fri-HD Harrie Jeans C, MD      . famotidine (PEPCID) tablet 20 mg  20 mg Oral Daily Harvie Heck, MD   20 mg at 10/29/20 0832  . gabapentin (NEURONTIN) capsule 100 mg  100 mg Oral QHS Rhyne, Samantha J, PA-C   100 mg at 10/28/20 2111  . heparin ADULT infusion 100 units/mL (25000 units/257m)  1,000 Units/hr Intravenous Continuous WLyndee Leo RPH 10 mL/hr at 10/28/20 2132 1,000 Units/hr at 10/28/20 2132  . HYDROmorphone (DILAUDID) injection 1 mg  1 mg Intravenous Q8H PRN BSanjuan Dame MD      . HYDROmorphone (DILAUDID) tablet 4 mg  4 mg Oral Q8H BSanjuan Dame MD   4 mg at 10/29/20 1615  . insulin aspart (novoLOG) injection 0-6 Units  0-6 Units Subcutaneous TID WC Rhyne, Samantha J, PA-C   1 Units at 10/26/20 0720  . lidocaine-prilocaine (EMLA) cream   Topical QShawna Clamp MD      . pantoprazole (PROTONIX) EC tablet 40 mg  40 mg Oral BID AC Rhyne, Samantha J, PA-C   40 mg at 10/29/20 1616  . sertraline (ZOLOFT) tablet 50 mg  50 mg Oral Daily Rhyne, SHulen Shouts PA-C   50 mg at 10/29/20 [id number] . sevelamer carbonate (RENVELA) tablet 800 mg  800 mg Oral TID WC Rhyne, Samantha J, PA-C   800 mg at 10/29/20 1616  . simvastatin (ZOCOR) tablet 40 mg  40 mg Oral q1800 Rhyne, Samantha J, PA-C   40 mg at 10/29/20 1617  . sodium chloride flush (NS) 0.9 % injection 10-40 mL  10-40 mL Intracatheter Q12H Rhyne, Samantha J, PA-C   10 mL at 10/29/20 [id number] . sodium chloride  flush (NS) 0.9 % injection 10-40 mL  10-40 mL Intracatheter PRN Rhyne, Samantha J, PA-C      . sodium chloride flush (NS) 0.9 % injection 3 mL  3 mL Intravenous Q12H Rhyne, Samantha J, PA-C   3 mL at 10/26/20 2112  . Warfarin - Pharmacist Dosing Inpatient   Does not apply q1600 [facility]endee Beavers [facility]  Given at 10/29/20 1617     Discharge Medications: Please see discharge summary for a list of discharge medications.  Relevant Imaging Results:  Relevant Lab Results:   Additional Information [id number] [facility]. PTravilahCOVID-19 Vaccine 05/16/20, 09/17/2019 , 08/18/2019  NBenard Halsted LCSW

## 2020-10-29 NOTE — Progress Notes (Signed)
Renal Navigator will collaborate with TOC CSW/N. Rayyan regarding outpatient HD and possible discharge to SNF pending recommendation by PT. OT recommends SNF and per TOC initial note, patient's daughter is requesting short term rehab at discharge.  Navigator informed CSW that's patient's outpatient HD seat schedule is currently MWF 11:10am (10:50am arrival) at [facility]). CSW states patient can go to [facility], as preferred. She can utilize [employer].  Navigator requests that patient be discharged on a NON-HD day if possible, as long as this does not prolong her stay in the hospital.  Navigator will follow.   Alphonzo Cruise, [place] Renal Navigator [phone]

## 2020-10-29 NOTE — Progress Notes (Signed)
Visit to room to reaccess port; pt states needs to use the restroom; VAST to return at later time

## 2020-10-30 LAB — RENAL FUNCTION PANEL
Albumin: 2.7 g/dL — ABNORMAL LOW (ref 3.5–5.0)
Anion gap: 8 (ref 5–15)
BUN: 24 mg/dL — ABNORMAL HIGH (ref 8–23)
CO2: 29 mmol/L (ref 22–32)
Calcium: 8.1 mg/dL — ABNORMAL LOW (ref 8.9–10.3)
Chloride: 97 mmol/L — ABNORMAL LOW (ref 98–111)
Creatinine, Ser: 5.6 mg/dL — ABNORMAL HIGH (ref 0.44–1.00)
GFR, Estimated: 7 mL/min — ABNORMAL LOW (ref >60)
Glucose, Bld: 189 mg/dL — ABNORMAL HIGH (ref 70–99)
Phosphorus: 3.9 mg/dL (ref 2.5–4.6)
Potassium: 4.1 mmol/L (ref 3.5–5.1)
Sodium: 134 mmol/L — ABNORMAL LOW (ref 135–145)

## 2020-10-30 LAB — CBC
HCT: 29.4 % — ABNORMAL LOW (ref 36.0–46.0)
Hemoglobin: 9.8 g/dL — ABNORMAL LOW (ref 12.0–15.0)
MCH: 27.3 pg (ref 26.0–34.0)
MCHC: 33.3 g/dL (ref 30.0–36.0)
MCV: 81.9 fL (ref 80.0–100.0)
Platelets: 160 10*3/uL (ref 150–400)
RBC: 3.59 MIL/uL — ABNORMAL LOW (ref 3.87–5.11)
RDW: 15.7 % — ABNORMAL HIGH (ref 11.5–15.5)
WBC: 8.1 10*3/uL (ref 4.0–10.5)
nRBC: 0 % (ref 0.0–0.2)

## 2020-10-30 LAB — GLUCOSE, CAPILLARY
Glucose-Capillary: 144 mg/dL — ABNORMAL HIGH (ref 70–99)
Glucose-Capillary: 140 mg/dL — ABNORMAL HIGH (ref 70–99)
Glucose-Capillary: 198 mg/dL — ABNORMAL HIGH (ref 70–99)
Glucose-Capillary: 182 mg/dL — ABNORMAL HIGH (ref 70–99)

## 2020-10-30 LAB — PROTIME-INR
INR: 1.2 (ref 0.8–1.2)
Prothrombin Time: 14.8 seconds (ref 11.4–15.2)

## 2020-10-30 LAB — HEPARIN LEVEL (UNFRACTIONATED)
Heparin Unfractionated: 0.22 IU/mL — ABNORMAL LOW (ref 0.30–0.70)
Heparin Unfractionated: 0.46 IU/mL (ref 0.30–0.70)

## 2020-10-30 MED ORDER — FENTANYL 12 MCG/HR TD PT72
1.0000 | MEDICATED_PATCH | TRANSDERMAL | Status: DC
  Administered 2020-10-30 – 2020-11-05 (×3): 1 via TRANSDERMAL
  Filled 2020-10-30 (×3): qty 1

## 2020-10-30 MED ORDER — HYDROMORPHONE HCL 2 MG PO TABS
1.0000 mg | ORAL_TABLET | Freq: Three times a day (TID) | ORAL | Status: AC
  Administered 2020-10-30: 1 mg via ORAL
  Filled 2020-10-30: qty 1

## 2020-10-30 MED ORDER — WARFARIN SODIUM 5 MG PO TABS
5.0000 mg | ORAL_TABLET | Freq: Once | ORAL | Status: AC
  Administered 2020-10-30: 5 mg via ORAL
  Filled 2020-10-30: qty 1

## 2020-10-30 MED ORDER — CALCITRIOL 0.5 MCG PO CAPS
ORAL_CAPSULE | ORAL | Status: AC
  Administered 2020-10-30: 1 ug via ORAL
  Filled 2020-10-30: qty 2

## 2020-10-30 MED ORDER — DARBEPOETIN ALFA 150 MCG/0.3ML IJ SOSY
PREFILLED_SYRINGE | INTRAMUSCULAR | Status: AC
  Administered 2020-10-30: 150 ug via INTRAVENOUS
  Filled 2020-10-30: qty 0.3

## 2020-10-30 MED ORDER — HYDROMORPHONE HCL 2 MG PO TABS
2.0000 mg | ORAL_TABLET | Freq: Three times a day (TID) | ORAL | Status: DC
Start: 2020-10-31 — End: 2020-11-06
  Administered 2020-10-31 – 2020-11-05 (×18): 2 mg via ORAL
  Filled 2020-10-30 (×18): qty 1

## 2020-10-30 MED ORDER — BENZONATATE 100 MG PO CAPS: 100.0000 mg | ORAL_CAPSULE | Freq: Three times a day (TID) | ORAL | Status: DC | PRN

## 2020-10-30 NOTE — Progress Notes (Signed)
[facility] Progress Note   Subjective: Seen in HD, using the medial limb of the L thigh aVG.    Objective Vitals:   10/30/20 0809 10/30/20 1429 10/30/20 1434 10/30/20 1500  BP: 112/84 (!) 177/83 (!) 161/70 (!) 143/59  Pulse: 70 73 71 66  Resp: '17 15 13 13  '$ Temp: 97.8 F (36.6 C) 98.3 F (36.8 C)    TempSrc: Oral Oral    SpO2: 96% 100%    Weight:  89.8 kg     Physical Exam General: Well appearing woman, NAD. Room air. Heart: RRR; no murmur Lungs: CTAB Abdomen: soft Extremities: No LE edema Dialysis Access: L thigh AVG, + bruit.; tender to palpation   Additional Objective Labs: Basic Metabolic Panel: Recent Labs  Lab 10/28/20 0507 10/29/20 0500 10/30/20 0219  NA 135 136 134*  K 4.3 3.8 4.1  CL 97* 99 97*  CO2 '30 29 29  '$ GLUCOSE 115* 72 189*  BUN 27* 14 24*  CREATININE 6.58* 4.20* 5.60*  CALCIUM 7.8* 7.6* 8.1*  PHOS 4.6 3.7 3.9   Liver Function Tests: Recent Labs  Lab 10/24/20 0934 10/26/20 0721 10/28/20 0507 10/29/20 0500 10/30/20 0219  AST 18  --   --   --   --   ALT 9  --   --   --   --   ALKPHOS 44  --   --   --   --   BILITOT 0.7  --   --   --   --   PROT 5.6*  --   --   --   --   ALBUMIN 2.9*   < > 2.2* 2.4* 2.7*   < > = values in this interval not displayed.   Recent Labs  Lab 10/24/20 0934  LIPASE 24   CBC: Recent Labs  Lab 10/26/20 0721 10/27/20 0309 10/28/20 0507 10/29/20 0500 10/30/20 0219  WBC 6.1 8.4 7.1 7.3 8.1  HGB 8.8* 9.5* 7.5* 9.1* 9.8*  HCT 27.6* 30.2* 23.5* 27.6* 29.4*  MCV 82.4 84.6 83.0 82.9 81.9  PLT 203 177 130* 140* 160   Medications: . heparin 1,150 Units/hr (10/30/20 0635)   . calcitRIOL  1 mcg Oral Q M,W,F-HD  . Chlorhexidine Gluconate Cloth  6 each Topical Daily  . Chlorhexidine Gluconate Cloth  6 each Topical Q0600  . cinacalcet  30 mg Oral Q M,W,F-HD  . darbepoetin (ARANESP) injection - DIALYSIS  150 mcg Intravenous Q Fri-HD  . famotidine  20 mg Oral Daily  . fentaNYL  1 patch  Transdermal Q72H  . gabapentin  100 mg Oral QHS  . HYDROmorphone  1 mg Oral Q8H  . [START ON 10/31/2020] HYDROmorphone  2 mg Oral Q8H  . insulin aspart  0-6 Units Subcutaneous TID WC  . lidocaine-prilocaine   Topical QODAY  . pantoprazole  40 mg Oral BID AC  . sertraline  50 mg Oral Daily  . sevelamer carbonate  800 mg Oral TID WC  . simvastatin  40 mg Oral q1800  . sodium chloride flush  10-40 mL Intracatheter Q12H  . sodium chloride flush  3 mL Intravenous Q12H  . warfarin  5 mg Oral ONCE-1600  . Warfarin - Pharmacist Dosing Inpatient   Does not apply [id number]    Dialysis Orders: MWF NW 4h 4/00/500 90.5kg 3K/2.5 bath Hep none L thigh AVG - mircera 100 q2 last 4/29, due 5/13 - calcitriol 1 ug po tiw  Assessment/Plan: 1. AV graft eschar --- w/ recent significant  bleeding episode at home. VVS following --s/p revision of left thigh AVG on 5/8. Dr. Carlis Abbott. See VVS drawing page for where to stick the AVG for next 4- 6 wks (thru June 6-20). We are to stick the medial limb until the lateral limb revision has healed up.   2. Hyperkalemia - resolved 3. ESRD - on HD MWF. HD today.  4. BP/ volume-- Variable BP-BP drops on HD. No meds here. No gross volume on exam. UF to EDW as tolerated  5. Anemia of ESRD/ABLA  ---Hgb 6.8 on admit.  Hgb 9.1 s/p 1U PRBCs on 5/6. ESA due on 5/13 -will order.  6. MBD-- Corr Ca/Phos ok. Continue binder, VDRA/sensipar q HD 7. DM2 --- per pmd 8. Prot C deficiency/Hx DVT/PE -- On AC. Heparin/warfarin per pharmacy   [name], MD 10/30/2020, 3:20 PM

## 2020-10-30 NOTE — Progress Notes (Signed)
Subjective:   Patient evaluated this AM. Ashlee Miles states she is not feeling well. She says this is because she continues to have abdominal pain. She was able to eat a little bit. She says "I don't know, but I'm okay". Denies any further bleeding. She notes a tickle in her throat with increased cough that is productive although denies any SOB. She says she feels cold. Denies any fevers.  Objective:  Vital signs in last 24 hours: Vitals:   10/29/20 1603 10/29/20 2035 10/29/20 2318 10/30/20 0503  BP: (!) 160/62 (!) 145/48 (!) 141/59 (!) 133/48  Pulse: 80 77 74 70  Resp: '16 16 17 16  '$ Temp: 98.2 F (36.8 C) 98.9 F (37.2 C) 98.6 F (37 C) 98.2 F (36.8 C)  TempSrc: Oral Oral Oral Oral  SpO2: 100% 99% 100% 100%  Weight:    92.1 kg   Physical Exam: General: Laying in bed, no acute distress CV: Regular rate, rhythm. Systolic murmur present. Pulm: Normal work of breathing on room air. Clear to auscultation bilaterally.  Abdomen: Soft, non-distended, mildly tender throughout. Skin: Surgical scars appear clean, dry, in tact. Psych: Normal mood, speech.   CBC Latest Ref Rng & Units 10/30/2020 10/29/2020 10/28/2020  WBC 4.0 - 10.5 K/uL 8.1 7.3 7.1  Hemoglobin 12.0 - 15.0 g/dL 9.8(L) 9.1(L) 7.5(L)  Hematocrit 36.0 - 46.0 % 29.4(L) 27.6(L) 23.5(L)  Platelets 150 - 400 K/uL 160 140(L) 130(L)   BMP Latest Ref Rng & Units 10/30/2020 10/29/2020 10/28/2020  Glucose 70 - 99 mg/dL 189(H) 72 115(H)  BUN 8 - 23 mg/dL 24(H) 14 27(H)  Creatinine 0.44 - 1.00 mg/dL 5.60(H) 4.20(H) 6.58(H)  Sodium 135 - 145 mmol/L 134(L) 136 135  Potassium 3.5 - 5.1 mmol/L 4.1 3.8 4.3  Chloride 98 - 111 mmol/L 97(L) 99 97(L)  CO2 22 - 32 mmol/L '29 29 30  '$ Calcium 8.9 - 10.3 mg/dL 8.1(L) 7.6(L) 7.8(L)   Assessment/Plan: Ashlee Miles is 80yo person living with ESRD on MWF HD, HFpEF, T2DM, protein C deficiency with prior DVT on Coumadin at home admitted 5/5 with AV graft ulceration, now POD5 s/p revision, continuing to  bridge warfarin to goal INR.  Principal Problem:   Complication of vascular access for dialysis Active Problems:   Essential hypertension   Chronic diastolic CHF (congestive heart failure) (HCC)   ESRD on dialysis ([place])   Anemia due to chronic kidney disease   Epigastric pain   Diabetes mellitus with end stage renal disease (HCC)   Protein C deficiency (HCC)   Abdominal pain  #AV graft ulceration, POD5 s/p revision #Protein C deficiency with prior DVT Graft site continues to be doing well, vascular surgery signed off this AM. INR continues to be sub-therapeutic at 1.2, heparin level also sub-therapeutic. Appreciate pharmacy assistance in this. Goal INR 2-3. - Heparin gtt - Warfarin per pharmacy - Goal INR 2-3 - Daily CBC, INR, heparin level  #ESRD on MWF HD Continues to appear euvolemic. Appears to have high blood pressures overnight, will monitor after HD to see if she will require antihypertensive. Previously she was not on anything at home.  - HD tomorrow - Lidocaine cream prior to HD - C/w binder  #Diffuse abdominal pain #Chronic opioid use disorder This morning appears abdominal pain has improved, although still bothersome. Continues to not endorse other GI symptoms, had BM earlier today and has been able to eat some. Most likely abdominal pain 2/2 opioid withdrawal. Will adjust opioid regimen today.  - Patient already  received po dilaudid '4mg'$  this AM, will have two more doses of po dilaudid '1mg'$  this afternoon and evening - Start fentanyl patch 7mg/hr - Tomorrow will start po dilaudid '2mg'$  q8h. - Hold opioids & call MD if SBP<90, HR<65, RR<10, O2<90, or altered mental status  #Cough Patient endorsing mild productive cough this AM. On exam patient is afebrile, lungs clear to auscultation bilaterally. Patient has been hospitalized for a week now, will encourage PT/OT and start flutter valve, incentive spirometry. - Tessalon '100mg'$  TID PRN - Flutter valve - Incentive  spirometry  #Acute on chronic anemia, resolved Hgb 9.8<9.1, no further signs of bleeding from surgical site. Patient to receive Mircera today per nephrology. Will continue to monitor. - Daily CBC - Mircera today  #Deconditioning  PT/OT recommending SNF. Will continue to work with CSW for placement. - PT/OT - SNF placement  #Anhedonia Mood appears somewhat improved today, but overall continues to feel down. Will continue to encourage her to work with PT/OT, hopeful family members will be able to visit as well. Can still consider SSRI if no improvement.  DIET: CM IVF: n/a DVT PPX: warfarin, heparin BOWEL: n/a CODE: DNR/DNI FAM COM: n/a  Prior to Admission Living Arrangement: Home Anticipated Discharge Location: SNF Barriers to Discharge: INR goal, SNF placement Dispo: Anticipated discharge in approximately 4 day(s).   BSanjuan Dame MD 10/30/2020, 6:04 AM Pager: [phone] 5pm on weekdays and 1pm on weekends: On Call pager [phone]

## 2020-10-30 NOTE — TOC Progression Note (Signed)
Transition of Care [facility]) - Progression Note    Patient Details  Name: Ashlee Miles MRN: [id number] Date of Birth: Aug 20, 1940  [facility]) CM/SW [place], [place] Phone Number: 10/30/2020, 3:49 PM  Clinical Narrative:     CSW informed [place] per MD notes-patient is not medically stable for discharge at this time.   Thurmond Butts, MSW, LCSW Clinical Social Worker   Expected Discharge Plan: Skilled Nursing Facility Barriers to Discharge: Continued Medical Work up  Expected Discharge Plan and Services Expected Discharge Plan: [place] Choice: [place] arrangements for the past 2 months: Single Family Home                                       Social Determinants of Health (SDOH) Interventions    Readmission Risk Interventions Readmission Risk Prevention Plan 03/25/2020  Transportation Screening Complete  Palliative Care Screening Not Applicable  Skilled Nursing Facility Complete  Some recent data might be hidden

## 2020-10-30 NOTE — Progress Notes (Signed)
Mobility Specialist: Progress Note   10/30/20 1221  Mobility  Activity  (STS x2 with LE exercises)  Level of Assistance Moderate assist, patient does 50-74%  Assistive Device Front wheel walker  Mobility Ambulated with assistance in room  Mobility Response Tolerated fair  Mobility performed by Mobility specialist  $Mobility charge 1 Mobility   Pre-Mobility: 67 HR Post-Mobility: 73 HR  Pt c/o 10/10 pain in LLE during mobility. Pt was modA to sit EOB from supine as well as to stand. Pt able to stand for 10 seconds during first bout and 15-20 seconds during second bout. Pt unable to march in place during either standing bout so further gait differed. Pt back in bed with RN present in room.   [facility] Masaru Chamberlin Mobility Specialist Mobility Specialist Phone: [phone]

## 2020-10-30 NOTE — Progress Notes (Signed)
[place] for IV Heparin >> Warfarin Indication: h/o DVT/PE, Protein C deficiency  No Known Allergies  Patient Measurements: Weight: 92.1 kg (203 lb 0.7 oz) Heparin Dosing Weight: 80 kg  Vital Signs: Temp: 98.6 F (37 C) (05/12 2318) Temp Source: Oral (05/12 2318) BP: 141/59 (05/12 2318) Pulse Rate: 74 (05/12 2318)  Labs: Recent Labs    10/28/20 0507 10/28/20 2305 10/29/20 0500 10/29/20 0553 10/30/20 0219  HGB 7.5*  --  9.1*  --  9.8*  HCT 23.5*  --  27.6*  --  29.4*  PLT 130*  --  140*  --  160  LABPROT 15.8*  --   --  15.6* 14.8  INR 1.3*  --   --  1.2 1.2  HEPARINUNFRC  --  0.10*  --  0.36 0.22*  CREATININE 6.58*  --  4.20*  --  5.60*    Estimated Creatinine Clearance: 9 mL/min (A) (by C-G formula based on SCr of 5.6 mg/dL (H)).   Medical History: Past Medical History:  Diagnosis Date  . CHF (congestive heart failure) ([place])   . Diabetes mellitus without complication ([place])   . ESRD (end stage renal disease) on dialysis ([place])   . History of pituitary adenoma   . Hypertension   . Protein C deficiency [facility])     Assessment: 80 y.o. female on HD on warfarin PTA for hx PE/DVT and protein C deficiency; supratherapeutic INR at 5.2 on admission 10/22/20.  Warfarin was placed on hold, and pt was started on IV Heparin. Heparin infusion was stopped 5/8 and patient was taken emergently to OR for bleeding AVG, ulceration of left thigh AV graft, S/P revision 5/8.  Protamine 30 mg IV given on 5/8.   PTA Warfarin dose:  Outpatient pharmacy reports last filled (LF) dates for warfarin as follows:  Warfarin 2 mg LF date 08/19/20 and 5 mg LF date 08/06/20 (have attempted to confirm home medication /dosage with patient's family member and this is still in process).  Last hospitalization note in Epic indicated: Warfarin 5 mg daily per discharge summary on 03/26/2020.   Heparin stopped 5/10 afternoon after nursing reported bleeding from left thigh  graft. Per vascular bleeding from cannulation site and no bleeding from surgical site revision and bleeding has resolved. Heparin and warfarin restarted 5/11 pm.    Heparin level down to subtherapeutic (0.22) on gtt at 1000 units/hr. No issues with line or bleeding reported per RN.  Goal of Therapy:  Heparin level 0.3-0.7 units/ml Monitor platelets by anticoagulation protocol: Yes    Plan:  Increase IV heparin drip to 1150 units/hr F/u 8 hr heparin level  Thank you for allowing pharmacy to be part of this patients care team.  Sherlon Handing, PharmD, BCPS Please see amion for complete clinical pharmacist phone list 10/30/2020 3:26 AM

## 2020-10-30 NOTE — Progress Notes (Signed)
[place] for IV Heparin >> Warfarin Indication: h/o DVT/PE, Protein C deficiency  No Known Allergies  Patient Measurements:  Height: 5 ft 4inches Weight: 89.8 kg (197 lb 15.6 oz) Heparin Dosing Weight: 80 kg  Vital Signs: Temp: 98.3 F (36.8 C) (05/13 1429) Temp Source: Oral (05/13 1429) BP: 143/59 (05/13 1500) Pulse Rate: 66 (05/13 1500)  Labs: Recent Labs    10/28/20 0507 10/28/20 2305 10/29/20 0500 10/29/20 0553 10/30/20 0219 10/30/20 1239  HGB 7.5*  --  9.1*  --  9.8*  --   HCT 23.5*  --  27.6*  --  29.4*  --   PLT 130*  --  140*  --  160  --   LABPROT 15.8*  --   --  15.6* 14.8  --   INR 1.3*  --   --  1.2 1.2  --   HEPARINUNFRC  --    < >  --  0.36 0.22* 0.46  CREATININE 6.58*  --  4.20*  --  5.60*  --    < > = values in this interval not displayed.    Estimated Creatinine Clearance: 8.8 mL/min (A) (by C-G formula based on SCr of 5.6 mg/dL (H)).   Medical History: Past Medical History:  Diagnosis Date  . CHF (congestive heart failure) ([place])   . Diabetes mellitus without complication ([place])   . ESRD (end stage renal disease) on dialysis ([place])   . History of pituitary adenoma   . Hypertension   . Protein C deficiency [facility])     Assessment: 80 y.o. female on HD on warfarin PTA for hx PE/DVT and protein C deficiency; supratherapeutic INR at 5.2 on admission 10/22/20.  Warfarin was placed on hold, and pt was started on IV Heparin. Heparin infusion was stopped 5/8 and patient was taken emergently to OR for bleeding AVG, ulceration of left thigh AV graft, S/P revision 5/8.  Protamine 30 mg IV given on 5/8.   PTA Warfarin dose:  Outpatient pharmacy reports last filled (LF) dates for warfarin as follows:  Warfarin 2 mg LF date 08/19/20 and 5 mg LF date 08/06/20 (have attempted to confirm home medication /dosage with patient's family member and this is still in process).  Last hospitalization note in Epic indicated: Warfarin 5 mg  daily per discharge summary on 03/26/2020.   Heparin stopped 5/10 afternoon after nursing reported bleeding from left thigh graft. Per vascular bleeding from cannulation site and no bleeding from surgical site revision and bleeding has resolved. Heparin and warfarin restarted 5/11 pm.   Today early AM,  Heparin level down to subtherapeutic (0.22) on gtt at 1000 units/hr. No issues with line or bleeding reported per RN.  Heparin drip was increased to 1150 units/hr.   Now the 8 hour HL is  0.46, therapeutic.  INR 1.2, no change, after receiving 2.'5mg'$  , dose held per MD 5/10 then '4mg'$  and '4mg'$  .    Hgb up to 9.8, pltc up to 160k,  No bleeding reported.    Goal of Therapy:  INR 2-3 Heparin level 0.3-0.7 units/ml Monitor platelets by anticoagulation protocol: Yes    Plan:  Continue IV heparin drip to 1150 units/hr Warfarin '5mg'$  today x1 Daily  heparin level , INR and CBC Monitor for bleeding  Thank you for allowing pharmacy to be part of this patients care team.  Nicole Cella, [place] Pharmacist [phone] Please check AMION for all [place] phone numbers After 10:00 PM, call  [place] [phone]  10/30/2020 3:20 PM

## 2020-10-30 NOTE — Progress Notes (Addendum)
Progress Note    10/30/2020 8:18 AM 5 Days Post-Op  Subjective:  Mild pain left thigh. Completed HD treatment  yesterday   Vitals:   10/30/20 0503 10/30/20 0809  BP: (!) 133/48 112/84  Pulse: 70 70  Resp: 16 17  Temp: 98.2 F (36.8 C) 97.8 F (36.6 C)  SpO2: 100% 96%    Physical Exam: General appearance: Awake, alert in no apparent distress Cardiac: Heart rate and rhythm are regular Respirations: Nonlabored Incisions: Left thigh incisions are all well approximated without bleeding or hematoma Extremities: Left thigh is edematous. All bandages removed>>no active bleeding. Good bruit in graft. Small area of dry eschar noted. No erythema or increased warmth.  CBC    Component Value Date/Time   WBC 8.1 10/30/2020 0219   RBC 3.59 (L) 10/30/2020 0219   HGB 9.8 (L) 10/30/2020 0219   HCT 29.4 (L) 10/30/2020 0219   PLT 160 10/30/2020 0219   MCV 81.9 10/30/2020 0219   MCH 27.3 10/30/2020 0219   MCHC 33.3 10/30/2020 0219   RDW 15.7 (H) 10/30/2020 0219   LYMPHSABS 1.5 10/22/2020 1259   MONOABS 0.6 10/22/2020 1259   EOSABS 0.3 10/22/2020 1259   BASOSABS 0.0 10/22/2020 1259    BMET    Component Value Date/Time   NA 134 (L) 10/30/2020 0219   K 4.1 10/30/2020 0219   CL 97 (L) 10/30/2020 0219   CO2 29 10/30/2020 0219   GLUCOSE 189 (H) 10/30/2020 0219   BUN 24 (H) 10/30/2020 0219   CREATININE 5.60 (H) 10/30/2020 0219   CALCIUM 8.1 (L) 10/30/2020 0219   CALCIUM 7.8 (L) 03/13/2017 1559   GFRNONAA 7 (L) 10/30/2020 0219   GFRAA 6 (L) 03/24/2020 0500     Intake/Output Summary (Last 24 hours) at 10/30/2020 0818 Last data filed at 10/30/2020 0809 Gross per 24 hour  Intake 484 ml  Output 0 ml  Net 484 ml    HOSPITAL MEDICATIONS Scheduled Meds: . calcitRIOL  1 mcg Oral Q M,W,F-HD  . Chlorhexidine Gluconate Cloth  6 each Topical Daily  . Chlorhexidine Gluconate Cloth  6 each Topical Q0600  . cinacalcet  30 mg Oral Q M,W,F-HD  . darbepoetin (ARANESP) injection -  DIALYSIS  150 mcg Intravenous Q Fri-HD  . famotidine  20 mg Oral Daily  . gabapentin  100 mg Oral QHS  . HYDROmorphone  4 mg Oral Q8H  . insulin aspart  0-6 Units Subcutaneous TID WC  . lidocaine-prilocaine   Topical QODAY  . pantoprazole  40 mg Oral BID AC  . sertraline  50 mg Oral Daily  . sevelamer carbonate  800 mg Oral TID WC  . simvastatin  40 mg Oral q1800  . sodium chloride flush  10-40 mL Intracatheter Q12H  . sodium chloride flush  3 mL Intravenous Q12H  . Warfarin - Pharmacist Dosing Inpatient   Does not apply q1600   Continuous Infusions: . heparin 1,150 Units/hr (10/30/20 0635)   PRN Meds:.acetaminophen, albuterol, HYDROmorphone (DILAUDID) injection, sodium chloride flush  Assessment and Plan:  ESRD: POD 5 left thigh AVG revision with interposition graft by Dr. Carlis Abbott secondary to overlying ulceration with bleeding. Graft accessed yesterday without complications. VVS wil sign off and be available as needed.   -DVT prophylaxis:  On heparin infusion   Risa Grill, PA-C [facility] [phone] 10/30/2020  8:18 AM   I have seen and evaluated the patient. I agree with the PA note as documented above.  Still has a good thrill in  the graft and would continue to access this graft medially for dialsysis at least 1 month postop while her incisions heal up.  Please call vascular surgery if there is questions or concerns.  Will sign off.  Good thrill in graft.  Marty Heck, MD [facility] [place] Office: [phone]

## 2020-10-31 LAB — CBC
HCT: 26.7 % — ABNORMAL LOW (ref 36.0–46.0)
Hemoglobin: 8.9 g/dL — ABNORMAL LOW (ref 12.0–15.0)
MCH: 27.2 pg (ref 26.0–34.0)
MCHC: 33.3 g/dL (ref 30.0–36.0)
MCV: 81.7 fL (ref 80.0–100.0)
Platelets: 155 10*3/uL (ref 150–400)
RBC: 3.27 MIL/uL — ABNORMAL LOW (ref 3.87–5.11)
RDW: 15.8 % — ABNORMAL HIGH (ref 11.5–15.5)
WBC: 5.5 10*3/uL (ref 4.0–10.5)
nRBC: 0 % (ref 0.0–0.2)

## 2020-10-31 LAB — PROTIME-INR
INR: 1.3 — ABNORMAL HIGH (ref 0.8–1.2)
Prothrombin Time: 15.9 seconds — ABNORMAL HIGH (ref 11.4–15.2)

## 2020-10-31 LAB — RENAL FUNCTION PANEL
Albumin: 2.4 g/dL — ABNORMAL LOW (ref 3.5–5.0)
Anion gap: 7 (ref 5–15)
BUN: 10 mg/dL (ref 8–23)
CO2: 29 mmol/L (ref 22–32)
Calcium: 8 mg/dL — ABNORMAL LOW (ref 8.9–10.3)
Chloride: 99 mmol/L (ref 98–111)
Creatinine, Ser: 3.72 mg/dL — ABNORMAL HIGH (ref 0.44–1.00)
GFR, Estimated: 12 mL/min — ABNORMAL LOW (ref >60)
Glucose, Bld: 173 mg/dL — ABNORMAL HIGH (ref 70–99)
Phosphorus: 2.7 mg/dL (ref 2.5–4.6)
Potassium: 3.8 mmol/L (ref 3.5–5.1)
Sodium: 135 mmol/L (ref 135–145)

## 2020-10-31 LAB — GLUCOSE, CAPILLARY
Glucose-Capillary: 124 mg/dL — ABNORMAL HIGH (ref 70–99)
Glucose-Capillary: 125 mg/dL — ABNORMAL HIGH (ref 70–99)
Glucose-Capillary: 162 mg/dL — ABNORMAL HIGH (ref 70–99)
Glucose-Capillary: 133 mg/dL — ABNORMAL HIGH (ref 70–99)

## 2020-10-31 LAB — HEPARIN LEVEL (UNFRACTIONATED): Heparin Unfractionated: 0.33 IU/mL (ref 0.30–0.70)

## 2020-10-31 MED ORDER — HYDROMORPHONE HCL 2 MG PO TABS
1.0000 mg | ORAL_TABLET | Freq: Once | ORAL | Status: AC
  Administered 2020-10-31: 1 mg via ORAL
  Filled 2020-10-31: qty 1

## 2020-10-31 MED ORDER — WARFARIN SODIUM 7.5 MG PO TABS
7.5000 mg | ORAL_TABLET | Freq: Once | ORAL | Status: AC
  Administered 2020-10-31: 7.5 mg via ORAL
  Filled 2020-10-31: qty 1

## 2020-10-31 NOTE — Progress Notes (Signed)
[place] for IV Heparin >> Warfarin Indication: h/o DVT/PE, Protein C deficiency  No Known Allergies  Patient Measurements:  Height: 5 ft 4inches Weight: 89 kg (196 lb 3.4 oz) Heparin Dosing Weight: 80 kg  Vital Signs: Temp: 98.2 F (36.8 C) (05/14 0335) Temp Source: Oral (05/14 0335) BP: 132/42 (05/14 0335) Pulse Rate: 64 (05/14 0335)  Labs: Recent Labs    10/29/20 0500 10/29/20 0553 10/30/20 0219 10/30/20 1239 10/31/20 0119  HGB 9.1*  --  9.8*  --  8.9*  HCT 27.6*  --  29.4*  --  26.7*  PLT 140*  --  160  --  155  LABPROT  --  15.6* 14.8  --  15.9*  INR  --  1.2 1.2  --  1.3*  HEPARINUNFRC  --  0.36 0.22* 0.46 0.33  CREATININE 4.20*  --  5.60*  --  3.72*    Estimated Creatinine Clearance: 13.2 mL/min (A) (by C-G formula based on SCr of 3.72 mg/dL (H)).   Medical History: Past Medical History:  Diagnosis Date  . CHF (congestive heart failure) ([place])   . Diabetes mellitus without complication ([place])   . ESRD (end stage renal disease) on dialysis ([place])   . History of pituitary adenoma   . Hypertension   . Protein C deficiency [facility])     Assessment: 80 y.o. female on HD on warfarin PTA for hx PE/DVT and protein C deficiency;   PTA Warfarin dose:  Outpatient pharmacy reports last filled (LF) dates for warfarin as follows:  Warfarin 2 mg LF date 08/19/20 and 5 mg LF date 08/06/20 (have attempted to confirm home medication /dosage with patient's family member and this is still in process).   Heparin stopped 5/10 afternoon after nursing reported bleeding from left thigh graft. Per vascular bleeding from cannulation site and no bleeding from surgical site revision and bleeding has resolved. Heparin and warfarin restarted 5/11 pm.   Today Hgb down from 9.8 to 8.9, PLT stable around 150. Heparin level  therapeutic (0.33). No issues with line or bleeding reported.   INR 1.3, after receiving 4-'5mg'$  for the past three days. Hgb up to  9.8, pltc up to 160k,  No bleeding reported.    Goal of Therapy:  INR 2-3 Heparin level 0.3-0.7 units/ml Monitor platelets by anticoagulation protocol: Yes    Plan:  Continue IV heparin drip to 1150 units/hr Warfarin 7.'5mg'$  today x1 Daily  heparin level , INR and CBC Monitor for bleeding  Thank you for allowing pharmacy to be part of this patients care team.  Norina Buzzard, PharmD PGY1 Pharmacy Resident 10/31/2020 8:55 AM  Please check AMION for all [place] phone numbers After 10:00 PM, call [place] [phone]

## 2020-10-31 NOTE — Progress Notes (Signed)
[facility] Progress Note   Subjective:  Seen in room, having pain in L leg still , worried about it.    Objective Vitals:   10/31/20 0335 10/31/20 0825 10/31/20 1230 10/31/20 1637  BP: (!) 132/42 (!) 127/59 (!) 142/47 (!) 153/54  Pulse: 64 64 78 68  Resp: '14 15 15 18  '$ Temp: 98.2 F (36.8 C) 98 F (36.7 C) 98.2 F (36.8 C) 98.2 F (36.8 C)  TempSrc: Oral Oral Oral Oral  SpO2: 99% 100% 98% 98%  Weight: 89 kg      Physical Exam General: Well appearing woman, NAD. Room air. Heart: RRR; no murmur Lungs: CTAB Abdomen: soft Extremities: mild LLE edema Dialysis Access: L thigh AVG, + bruit, tender to palpation. Good perfusion and movement of L toes/ foot.   Additional Objective Labs: Basic Metabolic Panel: Recent Labs  Lab 10/29/20 0500 10/30/20 0219 10/31/20 0119  NA 136 134* 135  K 3.8 4.1 3.8  CL 99 97* 99  CO2 '29 29 29  '$ GLUCOSE 72 189* 173*  BUN 14 24* 10  CREATININE 4.20* 5.60* 3.72*  CALCIUM 7.6* 8.1* 8.0*  PHOS 3.7 3.9 2.7   Liver Function Tests: Recent Labs  Lab 10/29/20 0500 10/30/20 0219 10/31/20 0119  ALBUMIN 2.4* 2.7* 2.4*   No results for input(s): LIPASE, AMYLASE in the last 168 hours. CBC: Recent Labs  Lab 10/27/20 0309 10/28/20 0507 10/29/20 0500 10/30/20 0219 10/31/20 0119  WBC 8.4 7.1 7.3 8.1 5.5  HGB 9.5* 7.5* 9.1* 9.8* 8.9*  HCT 30.2* 23.5* 27.6* 29.4* 26.7*  MCV 84.6 83.0 82.9 81.9 81.7  PLT 177 130* 140* 160 155   Medications: . heparin 1,150 Units/hr (10/31/20 0651)   . calcitRIOL  1 mcg Oral Q M,W,F-HD  . Chlorhexidine Gluconate Cloth  6 each Topical Daily  . Chlorhexidine Gluconate Cloth  6 each Topical Q0600  . cinacalcet  30 mg Oral Q M,W,F-HD  . darbepoetin (ARANESP) injection - DIALYSIS  150 mcg Intravenous Q Fri-HD  . famotidine  20 mg Oral Daily  . fentaNYL  1 patch Transdermal Q72H  . gabapentin  100 mg Oral QHS  . HYDROmorphone  2 mg Oral Q8H  . insulin aspart  0-6 Units Subcutaneous TID WC  .  lidocaine-prilocaine   Topical QODAY  . pantoprazole  40 mg Oral BID AC  . sertraline  50 mg Oral Daily  . sevelamer carbonate  800 mg Oral TID WC  . simvastatin  40 mg Oral q1800  . sodium chloride flush  10-40 mL Intracatheter Q12H  . sodium chloride flush  3 mL Intravenous Q12H  . warfarin  7.5 mg Oral ONCE-1600  . Warfarin - Pharmacist Dosing Inpatient   Does not apply [id number]    Dialysis Orders: MWF NW 4h 4/00/500 90.5kg 3K/2.5 bath Hep none L thigh AVG - mircera 100 q2 last 4/29, due 5/13 - calcitriol 1 ug po tiw  Assessment/Plan: 1. AV graft eschar --- w/ recent significant bleeding episode at home. VVS following --s/p revision of left thigh AVG on 5/8. Dr. Carlis Abbott. See VVS instructions page (up in HD unit) for where to stick the AVG for next 4- 6 wks (thru June 6-20).   2. ESRD - on HD MWF. Next HD Monday.  3. BP/ volume-- Variable BP-BP drops on HD. No meds here. No gross volume on exam. Just under dry 4. Anemia of ESRD/ABLA  ---Hgb 6.8 on admit.  Hgb 9.1 s/p 1U PRBCs on 5/6. Got IV darbe  150 ug on 5/13 here.   5. MBD-- Corr Ca/Phos ok. Continue binder, VDRA/sensipar q HD 6. DM2 --- per pmd 7. Prot C deficiency/Hx DVT/PE -- On AC. Heparin/warfarin per pharmacy, awaiting therapeutic INR 8. Dispo - awaiting SNF placement  [name], MD 10/31/2020, 5:04 PM

## 2020-10-31 NOTE — Progress Notes (Signed)
Subjective:   Patient resting comfortably in bed this morning. She reports still feeling bad but slightly better than yesterday. Breathing is good. Decreased appetite due to abdominal pain but notes that this has also slightly improved with change in pain medication regimen. Last bowel movement was yesterday. No further bleeding episodes.   Objective:  Vital signs in last 24 hours: Vitals:   10/31/20 0015 10/31/20 0017 10/31/20 0300 10/31/20 0335  BP:  (!) 131/38  (!) 132/42  Pulse:  67  64  Resp:  20  14  Temp:  98.6 F (37 C) 98.2 F (36.8 C) 98.2 F (36.8 C)  TempSrc: Oral Oral  Oral  SpO2:  100%  99%  Weight:    89 kg   Physical Exam: General: Laying in bed, no acute distress CV: Regular rate, rhythm. No m/r/g appreciated. Pulm: Normal work of breathing on room air Abdomen: Soft, non-distended, diffusely tender. Normoactive bowel sounds Skin: Surgical sites appear clean dry, in tact. No bleeding or purulence noted.  CBC Latest Ref Rng & Units 10/31/2020 10/30/2020 10/29/2020  WBC 4.0 - 10.5 K/uL 5.5 8.1 7.3  Hemoglobin 12.0 - 15.0 g/dL 8.9(L) 9.8(L) 9.1(L)  Hematocrit 36.0 - 46.0 % 26.7(L) 29.4(L) 27.6(L)  Platelets 150 - 400 K/uL 155 160 140(L)   BMP Latest Ref Rng & Units 10/31/2020 10/30/2020 10/29/2020  Glucose 70 - 99 mg/dL 173(H) 189(H) 72  BUN 8 - 23 mg/dL 10 24(H) 14  Creatinine 0.44 - 1.00 mg/dL 3.72(H) 5.60(H) 4.20(H)  Sodium 135 - 145 mmol/L 135 134(L) 136  Potassium 3.5 - 5.1 mmol/L 3.8 4.1 3.8  Chloride 98 - 111 mmol/L 99 97(L) 99  CO2 22 - 32 mmol/L '29 29 29  '$ Calcium 8.9 - 10.3 mg/dL 8.0(L) 8.1(L) 7.6(L)   Assessment/Plan: Ashlee Miles is 80yo person living with ESRD on MWF HD, HFpEF, T2DM, protein C deficiency with prior DVT on Coumadin at home admitted 5/5 with AV graft ulceration, now POD6 s/p revision, pending anticoagulant bridge and SNF placement.  Principal Problem:   Complication of vascular access for dialysis Active Problems:   Essential  hypertension   Chronic diastolic CHF (congestive heart failure) ([facility])   ESRD on dialysis ([place])   Anemia due to chronic kidney disease   Epigastric pain   Diabetes mellitus with end stage renal disease ([facility])   Protein C deficiency ([facility])   Abdominal pain  #AV graft ulceration, POD6 s/p revision #ESRD on MWF HD Graft site doing well, no further episodes of bleeding. Plan to use medial access for graft for one month while incisions are healing. Overall continues to be euvolemic after HD yesterday. BP more at goal overnight after HD yesterday. Appears per chart review she has had intermittently lower blood pressures with HD, will hold off antihypertensives. Appreciate nephrology assistance. Will try to discharge on non-dialysis day, ideally Tuesday vs Thursday.  - HD Monday - Use medial graft access - Monitor BMP, CBC  #Protein C deficiency with prior DVT INR 1.3 this morning from 1.2 yesterday. Goal INR 2-3.  Appreciate pharmacy assistance in bridging to home warfarin. Hopeful with increase in warfarin today will see improvement in INR over next 24-48 hours. - Heparin gtt - Warfarin per pharmacy - Goal INR 2-3 - Daily CBC, INR, heparin level  #Diffuse abdominal pain #Chronic opioid use disorder Pain continues to occur for Ashlee Miles Shark, although she does state changes made yesterday have improved her symptoms. Most likely this pain 2/2 opioid withdrawal, will continue with current  regimen. - Fentanyl patch 65mg/hr - po dilaudid '2mg'$  q8h - Hold & call MD if SBP<90, HR<65, RR<10, O2<90, or altered mental status  #Chronic anemia Hgb overall stable, 8.9 this AM. No further episodes of bleeding overnight. Does not appear patient received MIrcera, will touch base with nephrology on Monday.  - Daily CBC - Mircera Monday - Aranesp every Friday  #Deconditioning PT/OT continuing to work with patient. Believe the more she is able to get out of bed it will improve her overall mood and symptoms. -  PT/OT - SNF placement  #Anhedonia Mood stable, continues to appear down. This morning she did ask that I contact her daughter, ALevada Dy Plan on discussing this with ALevada Dyfor further insight. Can still consider SSRI in future.  DIET: CM IVF: n/a DVT PPX: warfarin, heparin BOWEL: n/a CODE: DNR/DNI  FAM COM: Will call daughter ALevada Dylater today.  Prior to Admission Living Arrangement: Home Anticipated Discharge Location: SNF Barriers to Discharge: INR goal, SNF Dispo: Anticipated discharge in approximately 3-5 day(s).   BSanjuan Dame MD 10/31/2020, 5:52 AM Pager: [phone] 5pm on weekdays and 1pm on weekends: On Call pager [phone]

## 2020-11-01 LAB — RENAL FUNCTION PANEL
Albumin: 2.4 g/dL — ABNORMAL LOW (ref 3.5–5.0)
Anion gap: 7 (ref 5–15)
BUN: 19 mg/dL (ref 8–23)
CO2: 26 mmol/L (ref 22–32)
Calcium: 8.4 mg/dL — ABNORMAL LOW (ref 8.9–10.3)
Chloride: 102 mmol/L (ref 98–111)
Creatinine, Ser: 5.72 mg/dL — ABNORMAL HIGH (ref 0.44–1.00)
GFR, Estimated: 7 mL/min — ABNORMAL LOW (ref >60)
Glucose, Bld: 109 mg/dL — ABNORMAL HIGH (ref 70–99)
Phosphorus: 3.5 mg/dL (ref 2.5–4.6)
Potassium: 4 mmol/L (ref 3.5–5.1)
Sodium: 135 mmol/L (ref 135–145)

## 2020-11-01 LAB — CBC
HCT: 29.4 % — ABNORMAL LOW (ref 36.0–46.0)
Hemoglobin: 9.7 g/dL — ABNORMAL LOW (ref 12.0–15.0)
MCH: 27.4 pg (ref 26.0–34.0)
MCHC: 33 g/dL (ref 30.0–36.0)
MCV: 83.1 fL (ref 80.0–100.0)
Platelets: 169 10*3/uL (ref 150–400)
RBC: 3.54 MIL/uL — ABNORMAL LOW (ref 3.87–5.11)
RDW: 15.9 % — ABNORMAL HIGH (ref 11.5–15.5)
WBC: 5.6 10*3/uL (ref 4.0–10.5)
nRBC: 0 % (ref 0.0–0.2)

## 2020-11-01 LAB — GLUCOSE, CAPILLARY
Glucose-Capillary: 111 mg/dL — ABNORMAL HIGH (ref 70–99)
Glucose-Capillary: 112 mg/dL — ABNORMAL HIGH (ref 70–99)
Glucose-Capillary: 145 mg/dL — ABNORMAL HIGH (ref 70–99)
Glucose-Capillary: 157 mg/dL — ABNORMAL HIGH (ref 70–99)

## 2020-11-01 LAB — HEPARIN LEVEL (UNFRACTIONATED): Heparin Unfractionated: 0.58 IU/mL (ref 0.30–0.70)

## 2020-11-01 LAB — PROTIME-INR
INR: 1.4 — ABNORMAL HIGH (ref 0.8–1.2)
Prothrombin Time: 16.7 seconds — ABNORMAL HIGH (ref 11.4–15.2)

## 2020-11-01 MED ORDER — WARFARIN SODIUM 10 MG PO TABS
10.0000 mg | ORAL_TABLET | Freq: Once | ORAL | Status: AC
  Administered 2020-11-01: 10 mg via ORAL
  Filled 2020-11-01: qty 1

## 2020-11-01 NOTE — Progress Notes (Signed)
Subjective: HD # 9 Overnight, no acute events reported.  [name] is resting comfortably in bed this morning. She reports feeling "alright" but does endorse pain in the right leg. No other acute concerns at this time.   Objective:  Vital signs in last 24 hours: Vitals:   10/31/20 2042 11/01/20 0003 11/01/20 0351 11/01/20 0809  BP: (!) 147/52 (!) 148/66 (!) 127/58 (!) 117/45  Pulse: 72 72 66 (!) 56  Resp: '16 18 16 12  '$ Temp: 98 F (36.7 C) (!) 97.4 F (36.3 C) 98.2 F (36.8 C)   TempSrc: Oral Oral Oral   SpO2: 100% 100% 98% 99%  Weight:   89.6 kg    CBC Latest Ref Rng & Units 10/31/2020 10/30/2020 10/29/2020  WBC 4.0 - 10.5 K/uL 5.5 8.1 7.3  Hemoglobin 12.0 - 15.0 g/dL 8.9(L) 9.8(L) 9.1(L)  Hematocrit 36.0 - 46.0 % 26.7(L) 29.4(L) 27.6(L)  Platelets 150 - 400 K/uL 155 160 140(L)   BMP Latest Ref Rng & Units 11/01/2020 10/31/2020 10/30/2020  Glucose 70 - 99 mg/dL 109(H) 173(H) 189(H)  BUN 8 - 23 mg/dL 19 10 24(H)  Creatinine 0.44 - 1.00 mg/dL 5.72(H) 3.72(H) 5.60(H)  Sodium 135 - 145 mmol/L 135 135 134(L)  Potassium 3.5 - 5.1 mmol/L 4.0 3.8 4.1  Chloride 98 - 111 mmol/L 102 99 97(L)  CO2 22 - 32 mmol/L '26 29 29  '$ Calcium 8.9 - 10.3 mg/dL 8.4(L) 8.0(L) 8.1(L)    Physical Exam  Constitutional: chronically ill appearing elderly female, No distress.  HENT: Normocephalic and atraumatic, EOMI, moist mucous membranes Cardiovascular: Normal rate, regular rhythm, S1 and S2 present, no murmurs, rubs, gallops.  Distal pulses intact Respiratory: No respiratory distress, no accessory muscle use.  Effort is normal. Neurological: Is alert and oriented x4, no apparent focal deficits noted. Skin: Warm and dry. Surgical site appears clean/dry/intact. No bleeding or purulence noted.   Assessment/Plan:  Principal Problem:   Complication of vascular access for dialysis Active Problems:   Essential hypertension   Chronic diastolic CHF (congestive heart failure) (H[facility])   ESRD on  dialysis ([place])   Anemia due to chronic kidney disease   Epigastric pain   Diabetes mellitus with end stage renal disease ([facility])   Protein C deficiency ([facility])   Abdominal pain  [name] with PMHx of ESRD on HD MWF, HFpEF, protein C deficiency with prior DVT on Coumadin admitted with AV graft ulceration s/p revision, pending anticoagulant bridge and SNF placement.   AV graft ulceration s/p revision POD 7 ESRD on HD MWF Patient reports pain at the graft site this morning. Surgical site remains clean, dry and intact without signs of bleeding. Plan to continue using medial access site for graft for one month while incision healing. Patient is able to tolerate HD without early termination of sessions.  - HD session Monday per nephrology - Use medial graft access - Continue to monitor renal function  - Avoid nephrotoxic agents as able   Subtherapeutic INR Protein C deficiency with prior DVT  Most recent INR 1.3 with goal INR 2-3. Patient remains on heparin gtt with last INR 1.4 today. No signs of bleeding.  - Heparin gtt and warfarin dosing per pharmacy - Goal INR 2-3 - Daily CBC, INR, heparin level  Anemia of chronic disease: Hemoglobin improved to 9.7 this morning. S/p 1u pRBC on 5/6 and IV darbe on 5/13. No signs of bleeding at this time. - Continue to monitor  CBC  Physical deconditioning:  PT/OT continuing to work with patient and is recommending SNF. Patient and family are agreeable to this. - Continue PT/OT eval - SNF placement   Chronic opioid use disorder  - Continue fentanyl patch 82mg/hr and dilaudid '2mg'$  q8h - Hold precautions in place  Diet: CM Fluids: None DVT Prophylaxis: heparin gtt, warfarin Bowel regimen: none Code status: DNR/DNI Family communication: Daughter, ALevada Miles updated on 5/14  Prior to Admission Living Arrangement: Home Anticipated Discharge Location: SNF Barriers to Discharge: INR goal, SNF placement  Dispo: Anticipated  discharge in approximately 2-3 day(s).   AHarvie Heck MD  [facility] PGY-2 11/01/2020, 9:35 AM Pager: [phone] 5pm on weekdays and 1pm on weekends: On Call pager [phone]

## 2020-11-01 NOTE — Progress Notes (Signed)
[facility] Progress Note   Subjective:  Seen in room, no new c/o   Objective Vitals:   11/01/20 0351 11/01/20 0809 11/01/20 0814 11/01/20 1300  BP: (!) 127/58 (!) 117/45  (!) 136/43  Pulse: 66 (!) 56  70  Resp: '16 12  15  '$ Temp: 98.2 F (36.8 C)  98.3 F (36.8 C) 98 F (36.7 C)  TempSrc: Oral  Oral Oral  SpO2: 98% 99%  100%  Weight: 89.6 kg      Physical Exam General: Well appearing woman, NAD. Room air. Heart: RRR; no murmur Lungs: CTAB Abdomen: soft Extremities: mild LLE edema Dialysis Access: L thigh AVG, + bruit, tender to palpation. Good perfusion and movement of L toes/ foot.   Additional Objective Labs: Basic Metabolic Panel: Recent Labs  Lab 10/30/20 0219 10/31/20 0119 11/01/20 0638  NA 134* 135 135  K 4.1 3.8 4.0  CL 97* 99 102  CO2 '29 29 26  '$ GLUCOSE 189* 173* 109*  BUN 24* 10 19  CREATININE 5.60* 3.72* 5.72*  CALCIUM 8.1* 8.0* 8.4*  PHOS 3.9 2.7 3.5   Liver Function Tests: Recent Labs  Lab 10/30/20 0219 10/31/20 0119 11/01/20 0638  ALBUMIN 2.7* 2.4* 2.4*   No results for input(s): LIPASE, AMYLASE in the last 168 hours. CBC: Recent Labs  Lab 10/28/20 0507 10/29/20 0500 10/30/20 0219 10/31/20 0119 11/01/20 0943  WBC 7.1 7.3 8.1 5.5 5.6  HGB 7.5* 9.1* 9.8* 8.9* 9.7*  HCT 23.5* 27.6* 29.4* 26.7* 29.4*  MCV 83.0 82.9 81.9 81.7 83.1  PLT 130* 140* 160 155 169   Medications: . heparin 1,150 Units/hr (10/31/20 2315)   . calcitRIOL  1 mcg Oral Q M,W,F-HD  . Chlorhexidine Gluconate Cloth  6 each Topical Daily  . Chlorhexidine Gluconate Cloth  6 each Topical Q0600  . cinacalcet  30 mg Oral Q M,W,F-HD  . darbepoetin (ARANESP) injection - DIALYSIS  150 mcg Intravenous Q Fri-HD  . famotidine  20 mg Oral Daily  . fentaNYL  1 patch Transdermal Q72H  . gabapentin  100 mg Oral QHS  . HYDROmorphone  2 mg Oral Q8H  . insulin aspart  0-6 Units Subcutaneous TID WC  . lidocaine-prilocaine   Topical QODAY  . pantoprazole  40 mg Oral  BID AC  . sertraline  50 mg Oral Daily  . sevelamer carbonate  800 mg Oral TID WC  . simvastatin  40 mg Oral q1800  . sodium chloride flush  10-40 mL Intracatheter Q12H  . sodium chloride flush  3 mL Intravenous Q12H  . warfarin  10 mg Oral ONCE-1600  . Warfarin - Pharmacist Dosing Inpatient   Does not apply [id number]    Dialysis Orders: MWF NW 4h 4/00/500 90.5kg 3K/2.5 bath Hep none L thigh AVG - mircera 100 q2 last 4/29, due 5/13 - calcitriol 1 ug po tiw  Assessment/Plan: 1. AV graft eschar --- w/ recent significant bleeding episode at home. VVS following --s/p revision of left thigh AVG on 5/8. Dr. Carlis Abbott. See VVS instructions page (up in HD unit) for where to stick the AVG for next 4- 6 wks (thru June 6-20).   2. ESRD - on HD MWF. Next HD Monday.  3. BP/ volume-- Variable BP-BP drops on HD. No meds here. No gross volume on exam. Just under dry 4. Anemia of ESRD/ABLA  ---Hgb 6.8 on admit.  Hgb 9.1 s/p 1U PRBCs on 5/6. Got IV darbe 150 ug on 5/13 here.   5. MBD-- Corr Ca/Phos  ok. Continue binder, VDRA/sensipar q HD 6. DM2 --- per pmd 7. Prot C deficiency/Hx DVT/PE -- On AC. Heparin/warfarin per pharmacy, awaiting therapeutic INR 8. Dispo - awaiting SNF placement  [name], MD 11/01/2020, 5:06 PM

## 2020-11-01 NOTE — Progress Notes (Signed)
[facility] [place] for IV Heparin >> Warfarin Indication: h/o DVT/PE, Protein C deficiency  No Known Allergies  Patient Measurements:  Height: 5 ft 4inches Weight: 89.6 kg (197 lb 8.5 oz) Heparin Dosing Weight: 80 kg  Vital Signs: Temp: 98.2 F (36.8 C) (05/15 0351) Temp Source: Oral (05/15 0351) BP: 127/58 (05/15 0351) Pulse Rate: 66 (05/15 0351)  Labs: Recent Labs    10/30/20 0219 10/30/20 1239 10/31/20 0119 11/01/20 [id number] 11/01/20 0943 11/01/20 1010  HGB 9.8*  --  8.9*  --  9.7*  --   HCT 29.4*  --  26.7*  --  29.4*  --   PLT 160  --  155  --  169  --   LABPROT 14.8  --  15.9*  --   --  16.7*  INR 1.2  --  1.3*  --   --  1.4*  HEPARINUNFRC 0.22* 0.46 0.33  --  0.58  --   CREATININE 5.60*  --  3.72* 5.72*  --   --     Estimated Creatinine Clearance: 8.6 mL/min (A) (by C-G formula based on SCr of 5.72 mg/dL (H)).   Medical History: Past Medical History:  Diagnosis Date  . CHF (congestive heart failure) ([place])   . Diabetes mellitus without complication ([place])   . ESRD (end stage renal disease) on dialysis ([place])   . History of pituitary adenoma   . Hypertension   . Protein C deficiency [facility])     Assessment: 80 y.o. female on HD on warfarin PTA for hx PE/DVT and protein C deficiency;   PTA Warfarin dose:  Outpatient pharmacy reports last filled (LF) dates for warfarin as follows:  Warfarin 2 mg LF date 08/19/20 and 5 mg LF date 08/06/20. Confirmed home dose with daughter PTA '10mg'$  daily except '5mg'$  on Sunday and Thursday  Heparin stopped 5/10 afternoon after nursing reported bleeding from left thigh graft. Per vascular bleeding from cannulation site and no bleeding from surgical site revision and bleeding has resolved. Heparin and warfarin restarted 5/11 pm.   Heparin level  therapeutic (0.58). No issues with line.  INR 1.4, after receiving 4-7.'5mg'$  for the past four days. Will go up on the dose to reflect what she was taking at home. Per  daughter she is rarely therapeutic and often runs low. Hgb up to 9.8, pltc up to 160k,  No bleeding reported.    Goal of Therapy:  INR 2-3 Heparin level 0.3-0.7 units/ml Monitor platelets by anticoagulation protocol: Yes    Plan:  Continue IV heparin drip to 1150 units/hr Warfarin '10mg'$  today x1 Daily  heparin level , INR and CBC Monitor for bleeding  Thank you for allowing pharmacy to be part of this patients care team.  Norina Buzzard, PharmD PGY1 Pharmacy Resident 11/01/2020 7:27 AM  Please check AMION for all [place] phone numbers After 10:00 PM, call [place] [phone]

## 2020-11-01 NOTE — Plan of Care (Signed)

## 2020-11-02 DIAGNOSIS — Z992 Dependence on renal dialysis: Secondary | ICD-10-CM

## 2020-11-02 DIAGNOSIS — D631 Anemia in chronic kidney disease: Secondary | ICD-10-CM

## 2020-11-02 DIAGNOSIS — N186 End stage renal disease: Secondary | ICD-10-CM | POA: Diagnosis not present

## 2020-11-02 DIAGNOSIS — T829XXA Unspecified complication of cardiac and vascular prosthetic device, implant and graft, initial encounter: Secondary | ICD-10-CM | POA: Diagnosis not present

## 2020-11-02 LAB — CBC
HCT: 30.6 % — ABNORMAL LOW (ref 36.0–46.0)
Hemoglobin: 9.8 g/dL — ABNORMAL LOW (ref 12.0–15.0)
MCH: 27.1 pg (ref 26.0–34.0)
MCHC: 32 g/dL (ref 30.0–36.0)
MCV: 84.8 fL (ref 80.0–100.0)
Platelets: 189 10*3/uL (ref 150–400)
RBC: 3.61 MIL/uL — ABNORMAL LOW (ref 3.87–5.11)
RDW: 16.3 % — ABNORMAL HIGH (ref 11.5–15.5)
WBC: 6.6 10*3/uL (ref 4.0–10.5)
nRBC: 0 % (ref 0.0–0.2)

## 2020-11-02 LAB — RENAL FUNCTION PANEL
Albumin: 2.6 g/dL — ABNORMAL LOW (ref 3.5–5.0)
Anion gap: 8 (ref 5–15)
BUN: 22 mg/dL (ref 8–23)
CO2: 26 mmol/L (ref 22–32)
Calcium: 8.7 mg/dL — ABNORMAL LOW (ref 8.9–10.3)
Chloride: 98 mmol/L (ref 98–111)
Creatinine, Ser: 6.56 mg/dL — ABNORMAL HIGH (ref 0.44–1.00)
GFR, Estimated: 6 mL/min — ABNORMAL LOW (ref >60)
Glucose, Bld: 152 mg/dL — ABNORMAL HIGH (ref 70–99)
Phosphorus: 3.4 mg/dL (ref 2.5–4.6)
Potassium: 4.5 mmol/L (ref 3.5–5.1)
Sodium: 132 mmol/L — ABNORMAL LOW (ref 135–145)

## 2020-11-02 LAB — GLUCOSE, CAPILLARY
Glucose-Capillary: 150 mg/dL — ABNORMAL HIGH (ref 70–99)
Glucose-Capillary: 128 mg/dL — ABNORMAL HIGH (ref 70–99)
Glucose-Capillary: 142 mg/dL — ABNORMAL HIGH (ref 70–99)
Glucose-Capillary: 158 mg/dL — ABNORMAL HIGH (ref 70–99)

## 2020-11-02 LAB — PROTIME-INR
INR: 1.5 — ABNORMAL HIGH (ref 0.8–1.2)
Prothrombin Time: 17.6 seconds — ABNORMAL HIGH (ref 11.4–15.2)

## 2020-11-02 LAB — HEPARIN LEVEL (UNFRACTIONATED): Heparin Unfractionated: 0.44 IU/mL (ref 0.30–0.70)

## 2020-11-02 MED ORDER — WARFARIN SODIUM 10 MG PO TABS
10.0000 mg | ORAL_TABLET | Freq: Once | ORAL | Status: AC
  Administered 2020-11-02: 10 mg via ORAL
  Filled 2020-11-02: qty 1

## 2020-11-02 NOTE — Progress Notes (Signed)
Physical Therapy Treatment Patient Details Name: Ashlee Miles MRN: [id number] DOB: 1941/01/11 Today's Date: 11/02/2020    History of Present Illness [name] is 80yo person living with ESRD on MWF HD, HFpEF, T2DM, protein C deficiency, hx DVT/PE on Coumadin presenting for evaluation of fistula due to profuse bleeding from her left thigh fistula. Per chart review, she was evaluated in the ED on 5/1 for this issue. Pt missing a few HD sessions. Denies fevers, chills, chest pain, dyspnea, cough, nausea, vomiting, abdominal pain, constipation, dysuria; an episode of diarrhea last week    PT Comments    PT received in supine, agreeable to therapy session and with good participation after encouragement and tolerance for transfer training and short gait task at bedside. Emphasis on bed mobility, safety with transfers, gradual progression of mobility within pain and activity tolerance and self-monitoring for symptoms of pain/dizziness. Pt requiring up to +2 modA for safety with transfers/standing tasks. Pt continues to benefit from PT services to progress toward functional mobility goals. Continue to recommend SNF.   Follow Up Recommendations  SNF;Supervision/Assistance - 24 hour     Equipment Recommendations  None recommended by PT;Other (comment)    Recommendations for Other Services       Precautions / Restrictions Precautions Precautions: Fall Restrictions Weight Bearing Restrictions: No    Mobility  Bed Mobility Overal bed mobility: Needs Assistance Bed Mobility: Supine to Sit;Sit to Supine;Rolling Rolling: Min assist   Supine to sit: Mod assist Sit to supine: Mod assist   General bed mobility comments: mod A to elevate trunk and  for LEs back onto bed. Pt able to advance hips while seated EOB with minA. Required increased time and effort to perform and cues throughout for self-assist.    Transfers Overall transfer level: Needs assistance Equipment used: Rolling walker (2  wheeled) Transfers: Sit to/from [facility] Sit to Stand: Mod assist;Min assist Stand pivot transfers: Min assist       General transfer comment: cues for hand placement and min/modA for steadying upon standing at RW; decreased assist needed standing from [facility] to RW.  Ambulation/Gait Ambulation/Gait assistance: Min assist;+2 safety/equipment Gait Distance (Feet): 5 Feet (x2) Assistive device: Rolling walker (2 wheeled) Gait Pattern/deviations: Step-to pattern;Shuffle (forward head/downward gaze)     General Gait Details: cues for posture needed, min guard to minA at most for stability, good use of RW; very slow, guarded steps, no overt LOB or buckling seen but distance limited 2/2 pt fatigue; from EOB<>BSC placed ~66f from bed.   Stairs             Wheelchair Mobility    Modified Rankin (Stroke Patients Only)       Balance Overall balance assessment: Needs assistance Sitting-balance support: No upper extremity supported;Feet supported Sitting balance-[name]: Fair Sitting balance - Comments: pt able to sit and weight shift/reach within BOS to assist with hygiene tasks no LOB   Standing balance support: Bilateral upper extremity supported;During functional activity Standing balance-[name]: Poor Standing balance comment: reliant on RW and external assist                            Cognition Arousal/Alertness: Awake/alert Behavior During Therapy: WFL for tasks assessed/performed Overall Cognitive Status: No family/caregiver present to determine baseline cognitive functioning  General Comments: hx of cognitive impairments, pt following 1-step commands with increased time, pt somewhat self-directed as well.      Exercises General Exercises - Lower Extremity Ankle Circles/Pumps: AROM;Both;15 reps;Seated    General Comments General comments (skin integrity, edema, etc.): HR 69-70's bpm  with exertion; poor pleth signal but no increased WOB; no dizziness reported      Pertinent Vitals/Pain Pain Assessment: No/denies pain    Home Living                      Prior Function            PT Goals (current goals can now be found in the care plan section) Acute Rehab PT Goals Patient Stated Goal: get some rehab at SNF before coming home. PT Goal Formulation: With patient Time For Goal Achievement: 11/12/20 Potential to Achieve Goals: Good Progress towards PT goals: Progressing toward goals    Frequency    Min 3X/week      PT Plan Current plan remains appropriate    Co-evaluation              AM-PAC PT "6 Clicks" Mobility   Outcome Measure  Help needed turning from your back to your side while in a flat bed without using bedrails?: A Little Help needed moving from lying on your back to sitting on the side of a flat bed without using bedrails?: A Lot Help needed moving to and from a bed to a chair (including a wheelchair)?: A Lot Help needed standing up from a chair using your arms (e.g., wheelchair or bedside chair)?: A Lot Help needed to walk in hospital room?: A Lot Help needed climbing 3-5 steps with a railing? : A Lot 6 Click Score: 13    End of Session Equipment Utilized During Treatment: Gait belt Activity Tolerance: Patient tolerated treatment well;Patient limited by fatigue (tired after recently working with OT as well.) Patient left: in bed;with call bell/phone within reach;with bed alarm set;with nursing/sitter in room (NT present to assist her) Nurse Communication: Mobility status PT Visit Diagnosis: Unsteadiness on feet (R26.81);Other abnormalities of gait and mobility (R26.89);Muscle weakness (generalized) (M62.81);Difficulty in walking, not elsewhere classified (R26.2);Pain Pain - Right/Left: Left Pain - part of body: Leg     Time: [id number] PT Time Calculation (min) (ACUTE ONLY): 32 min  Charges:  $Gait Training: 8-22  mins $Therapeutic Activity: 8-22 mins                     Hassen Bruun P., PTA Acute Rehabilitation Services Pager: [phone] Office: [place] 11/02/2020, 3:49 PM

## 2020-11-02 NOTE — Discharge Summary (Addendum)
Name: Ashlee Miles MRN: [id number] DOB: 05-01-1941 80 y.o. PCP: Ashlee Cuff, MD  Date of Admission: 10/22/2020 12:40 PM Date of Discharge: 11/05/2020 Attending Physician: Campbell Riches, MD  Subjective: Ms. Ashlee Miles says she has her appetite back and ate all her breakfast. She denies any CP, abdominal pain, SOB, bleeding, constipation, or any other symptoms. She says she is feeling "much better" but not 100% back to her previous self. Discussed plan for discharge today to SNF.  Discharge Diagnosis: 1. 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 ESRD on MWF HD 3. 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 Chronic opioid use disorder 5. Chronic anemia 6. Hypertension 7. Depression  Discharge Medications: Allergies as of 11/05/2020   No Known Allergies     Medication List    STOP taking these medications   amLODipine 5 MG tablet Commonly known as: NORVASC   carvedilol 3.125 MG tablet Commonly known as: COREG   Daily-Vite Multivitamin Tabs   HYDROcodone-acetaminophen 5-325 MG tablet Commonly known as: NORCO/VICODIN   polyethylene glycol 17 g packet Commonly known as: MIRALAX / GLYCOLAX   sucralfate 1 GM/10ML suspension Commonly known as: CARAFATE   thiamine 100 MG tablet Commonly known as: Vitamin B-1     TAKE these medications   Accu-Chek Aviva Plus test strip Generic drug: glucose blood 1 each 3 (three) times daily.   albuterol 108 (90 Base) MCG/ACT inhaler Commonly known as: VENTOLIN HFA Inhale 2 puffs into the lungs every 4 (four) hours as needed for wheezing or shortness of breath. What changed: Another medication with the same name was removed. Continue taking this medication, and follow the directions you see here.   Aranesp (Albumin Free) 40 MCG/ML Soln Generic drug: Darbepoetin Alfa Aranesp (in polysorbate) 40 mcg/mL   BD Pen Needle Nano U/F 32G X 4 MM Misc Generic drug: Insulin Pen Needle 3 (three) times daily.   calcitRIOL 0.5 MCG  capsule Commonly known as: ROCALTROL Take 0.5 mcg by mouth daily.   cinacalcet 30 MG tablet Commonly known as: SENSIPAR Take 30 mg by mouth 3 (three) times a week.   docusate sodium 100 MG capsule Commonly known as: COLACE Take 1 capsule (100 mg total) by mouth 2 (two) times daily.   famotidine 20 MG tablet Commonly known as: PEPCID Take 1 tablet (20 mg total) daily by mouth.   feeding supplement (PRO-STAT SUGAR FREE 64) Liqd Take 30 mLs by mouth 2 (two) times daily.   gabapentin 100 MG capsule Commonly known as: NEURONTIN Take 100 mg by mouth at bedtime.   HumuLIN R 100 units/mL injection Generic drug: insulin regular   lidocaine-prilocaine cream Commonly known as: EMLA Apply 1 application topically as directed.   midodrine 10 MG tablet Commonly known as: PROAMATINE Take 1 tablet (10 mg total) by mouth every Monday, Wednesday, and Friday with hemodialysis.   multivitamin Tabs tablet Take 1 tablet by mouth at bedtime.   Oxycodone HCl 10 MG Tabs Take 10 mg by mouth every 6 (six) hours as needed (pain).   pantoprazole 40 MG tablet Commonly known as: PROTONIX Take 1 tablet (40 mg total) 2 (two) times daily before a meal by mouth.   sertraline 50 MG tablet Commonly known as: ZOLOFT Take 1 tablet (50 mg total) by mouth daily.   sevelamer carbonate 800 MG tablet Commonly known as: RENVELA Take 800 mg by mouth 3 (three) times daily with meals.   simvastatin 40 MG tablet Commonly known as: ZOCOR Take 40 mg by mouth  daily at 6 PM.   warfarin 5 MG tablet Commonly known as: COUMADIN Take 5 mg by mouth daily.   ZTlido 1.8 % Ptch Generic drug: Lidocaine Place 1 patch onto the skin every other day.       Disposition and follow-up:   Ms.Ashlee Miles was discharged from [facility] in Stable condition.  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  Patient to follow-up with vascular surgery.   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 She should continue taking warfarin '5mg'$  daily.   Chronic opioid use disorder: Discharged with home oxycodone. Can consider switching to dilaudid given ESRD.   Hypertension: Patient not on antihypertensives, would continue holding given variability that occurs with dialysis.   Depression: Patient has suffered from depression chronically, can consider further titration of SSRI to help with mood.    Labs / imaging needed at time of follow-up: CBC, INR, BMP   Pending labs/ test needing follow-up: n/a  Follow-up Appointments:  Contact information for follow-up providers    [facility] Follow up in 4 week(s).   Specialty: Vascular Surgery Why: sent  Contact information: [place] [id number] [phone]       Ashlee Cuff, MD Follow up in 1 week(s).   Specialty: Internal Medicine Contact information: [address] [place] [address] [phone]            Contact information for after-discharge care    Destination    [facility] Preferred SNF .   Service: Skilled Nursing Contact information: [address] [place] Hospital Course by problem list: 1. 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 On arrival patient was afebrile, hemodynamically stable, and asymptomatic. Vascular surgery was consulted in ED with plans for revision. At the time patient's INR was supratherapeutic, so surgery was to wait until Monday 5/9. On Sunday 5/8 patient had bleeding from AV graft site and was taken  emergently to the OR for revision. Patient tolerated the procedure well. Throughout rest of admission graft remained in tact. Per vascular surgery, will need to hold off use of graft site until 4-6 weeks after surgery. Patient to follow-up with vascular surgery in the outpatient setting.  2. ESRD on MWF HD: Patient underwent dialysis as scheduled until May 16th. At that time she had to be rescheduled for Tuesday, May 17th. Unfortunately patient was suffering a great deal of pain at that time which prevented her from receiving dialysis then. She resumed her regular schedule on Wednesday, May 18th. Renal navigator has assisted in finding appropriate HD for when patient is discharged to SNF.   3. 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 In the ED she was given vitamin K and her home warfarin was held. After surgical revision of AV graft, patient was continued on heparin drip as well as warfarin for bridge. Unfortunately patient suffered bleeding incident from HD cannulation sites on 5/10 and heparin had to be stopped.  After 24 hour holiday, heparin was re-started and INR was checked daily. Patient became therapeutic on Wednesday, May 18th and heparin was stopped at that time. Pharmacy assisted in dosing warfarin throughout hospitalization, appreciate assistance.  4. Chronic opioid use disorder: Patient began reporting severe abdominal pain a couple days into her admission. Initially thought to be related to gastritis, she was started on pantoprazole. H2 blocker was then added along with sulcrafate. Unfortunately these interventions did little for her abdominal pain. Given the pain was extremely severe and out of proportion in setting of her poor vasculature, CTA was obtained to rule out mesenteric ischemia. This was negative. It was then determined her abdominal pain was likely due to opioid withdrawals from chronic oxycodone use for chronic pain  syndrome. After a few days of adjusting medications patient's pain became more tolerable on Fentanyl patch and po dilaudid. Would recommend similar regimen as outpatient.   5. Chronic anemia: Patient suffers from chronic anemia, most likely due to her chronic kidney disease. When she had the bleeding event from her cannulation site, hemoglobin dropped below 8. At that time it was determined to give her a unit of blood given her poor vasculature and overall not feeling well. Over the next few days after infusion patient appeared better clinically and hemoglobin stayed at goal. She did receive Aranesp on Friday May 13th in lieu of regularly scheduled Mircera per nephrology.  6. Hypertension: Patient has antihypertensives listed on her home medications, however patient is not taking these. Throughout admission patient's blood pressure remained at goal. During HD BP varies, therefore will continue holding any home antihypertensives.  7. Depression: Throughout admission, patient was noted to have significant anhedonia. Discussed with patient's daughter, appears to be chronic issue for Ms. Owens Shark. She is seen by palliative in the outpatient setting, could benefit from further titration of antidepressants. Given her variable po intake, could consider mirtazapine.   Discharge Exam:   BP (!) 124/38 (BP Location: Right Wrist)   Pulse 61   Temp 98 F (36.7 C) (Oral)   Resp 12   Wt 94.3 kg   SpO2 99%   BMI 35.68 kg/m  General: Laying in bed, no acute distress HENT: Normocephalic, atraumatic. CV: Regular rate, rhythm. No m/r/g Pulm: Normal work of breathing on room air. Skin: Graft site covered with bandage. No bleeding or purulence visible. Neuro: Awake, alert, answering questions appropriately.   Pertinent Labs, Studies, and Procedures:  CBC Latest Ref Rng & Units 11/05/2020 11/04/2020 11/03/2020  WBC 4.0 - 10.5 K/uL 6.3 6.4 5.8  Hemoglobin 12.0 - 15.0 g/dL 9.5(L) 9.4(L) 9.2(L)  Hematocrit 36.0 - 46.0  % 29.5(L) 28.8(L) 28.6(L)  Platelets 150 - 400 K/uL 192 203 186   BMP Latest Ref Rng & Units 11/05/2020 11/04/2020 11/03/2020  Glucose 70 - 99 mg/dL 110(H) 100(H) 106(H)  BUN 8 - 23 mg/dL 18 34(H) 28(H)  Creatinine 0.44 - 1.00 mg/dL 6.22(H) 9.20(H) 8.33(H)  Sodium 135 - 145 mmol/L 134(L) 130(L) 132(L)  Potassium 3.5 - 5.1 mmol/L 4.8 5.1 4.6  Chloride 98 - 111 mmol/L 99 96(L) 97(L)  CO2 22 - 32 mmol/L '29 27 27  '$ Calcium 8.9 - 10.3 mg/dL 8.6(L) 8.9 8.4(L)   Revision of AV graft revision 10/26/20  Discharge Instructions:  Ms. Stoldt, I am glad you are feeling better and we were able to fix your graft site. Your blood counts are stable and your blood has stayed thin enough for you to go home! Please see the following notes:  You can continue your previous pain medication you were on. We are not changing any medications currently. Please make sure to continue taking your warfarin and following up with your primary care doctor.  You will continue dialysis on Mondays, Wednesdays, and Fridays.  It was a pleasure to meet you, Ms. Owens Shark. I wish you the best and hope you stay happy and healthy!  Thank you, [name], MD  Signed: [name], MD 11/05/2020, 7:24 AM   Pager: [phone]

## 2020-11-02 NOTE — Progress Notes (Signed)
Hemodialysis treatment rescheduled for Tuesday, May 17 per Dr. Jonnie Finner.  Primary RN Rupert Stacks was notified of change.

## 2020-11-02 NOTE — Progress Notes (Signed)
   11/02/20 1411  Clinical Encounter Type  Visited With Patient  Visit Type Spiritual support  Referral From Nurse  Consult/Referral To Chaplain  Spiritual Encounters  Spiritual Needs Emotional  Stress Factors  Patient Stress Factors Health changes  Chaplain responded to the consult for Ashlee Miles.   We were able to talk, and she said at the beginning of her arrival she didn't want to talk with anyone.  She knew she was not feeling good and stated "She felt like she was leaving here" Ashlee Miles seemed to be in better spirits, she was ready for her physical therapy to begin and said she is doing better than last week.   Chaplain Shadiamond Koska [place] [phone]

## 2020-11-02 NOTE — Progress Notes (Signed)
ANTICOAGULATION CONSULT NOTE - Follow Up Consult  Pharmacy Consult for Heparin >> Warfarin Indication: hx DVT/PE, Protein C deficiency  No Known Allergies  Patient Measurements: Height: '5\' 4"'$  (162.6 cm) Weight: 94.3 kg (207 lb 14.3 oz) IBW/kg (Calculated) : 54.7 Heparin Dosing Weight: 80 kg  Vital Signs: Temp: 97.7 F (36.5 C) (05/16 0725) Temp Source: Oral (05/16 0725) BP: 120/47 (05/16 0725) Pulse Rate: 62 (05/16 0725)  Labs: Recent Labs    10/31/20 0119 11/01/20 [id number] 11/01/20 0943 11/01/20 1010 11/02/20 0235  HGB 8.9*  --  9.7*  --  9.8*  HCT 26.7*  --  29.4*  --  30.6*  PLT 155  --  169  --  189  LABPROT 15.9*  --   --  16.7* 17.6*  INR 1.3*  --   --  1.4* 1.5*  HEPARINUNFRC 0.33  --  0.58  --  0.44  CREATININE 3.72* 5.72*  --   --  6.56*   ESRD  Assessment: 80 y.o. female on warfarin PTA for hx PE/DVT and protein C deficiency. Warfarin held for procedure, resumed 5/9. On IV heparin bridge.   Heparin stopped and warfarin held 5/10 afternoon after nursing reported bleeding from left thigh graft. Per vascular bleeding from cannulation site and no bleeding from surgical site revision and bleeding has resolved. Heparin and warfarin restarted 5/11 pm.   Heparin level remains therapeutic (0.44) on 1150 units/hr. INR 1.5, slow to increase. Has had 5 mg > 7.5 mg > 10 mg the last 3 days.   PTA Warfarin dose:  Outpatient pharmacy reports last filled (LF) dates for warfarin as follows: Warfarin 2 mg LF date 08/19/20 and 5 mg LF date 08/06/20, previously attempted to confirm home medication /dosage with patient's family member.  On 56/15, reported 10 mg daily except 5 mg on Sunday and Thursday. .  Goal of Therapy:  INR 2-3 Heparin level 0.3-0.7 units/ml Monitor platelets by anticoagulation protocol: Yes   Plan:  Continue heparin drip at 1150 units/hr Warfarin 10 mg x 1 again today. Daily heparin level, PT/INR and CBC.  Arty Baumgartner, RPh 11/02/2020,10:22  AM

## 2020-11-02 NOTE — Progress Notes (Addendum)
Subjective:   Ashlee Miles states that her appetite is returning. She says she's doing "okay" this morning but is bothered by left thigh pain. She says things keep going through her mind since she's been here but that she's "not going to give up".   Objective:  Vital signs in last 24 hours: Vitals:   11/01/20 1742 11/01/20 1911 11/01/20 2352 11/02/20 0413  BP: (!) 153/47 (!) 146/59 (!) 153/49 (!) 124/38  Pulse: 67 68 66 61  Resp: '17 16 15 12  '$ Temp: 98.2 F (36.8 C) 98.4 F (36.9 C) 98.2 F (36.8 C) 98 F (36.7 C)  TempSrc: Oral Oral Oral Oral  SpO2: 100% 100% 100% 99%  Weight:    94.3 kg   Physical Exam: General: Laying in bed, no acute distress CV: Regular rate, rhythm. No m/r/g appreciated. Pulm: Normal work of breathing, normal effort. Abdomen: Soft, non-distended, diffusely mildly tender Skin: Warm, dry. Surgical sites appear clean, dry, in tact. No drainage or bleeding appreciated.  CBC Latest Ref Rng & Units 11/02/2020 11/01/2020 10/31/2020  WBC 4.0 - 10.5 K/uL 6.6 5.6 5.5  Hemoglobin 12.0 - 15.0 g/dL 9.8(L) 9.7(L) 8.9(L)  Hematocrit 36.0 - 46.0 % 30.6(L) 29.4(L) 26.7(L)  Platelets 150 - 400 K/uL 189 169 155   BMP Latest Ref Rng & Units 11/02/2020 11/01/2020 10/31/2020  Glucose 70 - 99 mg/dL 152(H) 109(H) 173(H)  BUN 8 - 23 mg/dL '22 19 10  '$ Creatinine 0.44 - 1.00 mg/dL 6.56(H) 5.72(H) 3.72(H)  Sodium 135 - 145 mmol/L 132(L) 135 135  Potassium 3.5 - 5.1 mmol/L 4.5 4.0 3.8  Chloride 98 - 111 mmol/L 98 102 99  CO2 22 - 32 mmol/L '26 26 29  '$ Calcium 8.9 - 10.3 mg/dL 8.7(L) 8.4(L) 8.0(L)   Assessment/Plan: Ashlee Miles is 80yo person living with ESRD on MWF HD, HFpEF, T2DM, protein C deficiency with prior DVT on Coumadin at home admitted 5/5 with AV graft ulceration, now POD8 s/p revision, continuing to bridge anticoagulant to therapeutic INR and pending SNF placement.  Principal Problem:   Complication of vascular access for dialysis Active Problems:   Essential  hypertension   Chronic diastolic CHF (congestive heart failure) (HCC)   ESRD on dialysis ([place])   Anemia due to chronic kidney disease   Epigastric pain   Diabetes mellitus with end stage renal disease (HCC)   Protein C deficiency (HCC)   Abdominal pain  #AV graft ulceration, POD8 s/p revision #ESRD on MWF HD Surgical site continues to do well, no other episodes of bleeding over the weekend. Discussed with patient this morning that it could take some time for the left thigh pain to improve. Will continue to use medial access for graft per vascular surgery (through June 20). Will plan for HD today, appreciate nephrology assistance. Still planning on discharge on non-dialysis day if possible. Will continue BP management and volume control w/ HD given variability of BP.  - HD today - Lidocaine cream 57mn-1hr prior to HD - Use medial access until 6/20 - Daily BMP, CBC  #Subtherapeutic INR #Protein deficiency with prior DVT INR 1.5, heparin level at goal this AM. Appreciate pharmacy assistance with bridging to warfarin. Per chart review, daughter states she has been on '10mg'$  daily (except two days of the week). INR slowly improving, likely taking prolonged time due to previously given vitamin K during bleeding event.  - Heparin gtt - Warfarin per pharmacy - Goal INR 2-3 - Daily CBC, INR, heparin level.  #Anemia of chronic disease  Hgb continues to be stable, no further bleeding events since last week. Patient did receive Aranesp on Friday with HD. Will continue to monitor. - Daily CBC  #Deconditioning Patient continues to work with physical therapy. Discussed with daughter over the weekend, who agrees with SNF placement (as does the patient). Overall feel her symptoms and outlook will be improved once she is able to build some strength and be more independent. - PT/OT - SNF placement   #Chronic opioid use disorder No acute changes, appears abdominal pain has improved since last week. Will  continue with current regimen. - PO dilaudid '2mg'$  q8h - Fentanyl patch 12 mcg/hr - Hold & Call MD if SBP<90, HR<65, RR<10, O2<90, or altered mental status.  DIET: CM IVF: n/a DVT PPX: warfarin, heparin BOWEL: n/a CODE: DNR/DNI FAM COM: Discussed with daughter, Ashlee Miles, over the weekend. Will touch base with her this week prior to discharge.  Prior to Admission Living Arrangement: Home Anticipated Discharge Location: SNF Barriers to Discharge: INR goal, SNF placement Dispo: Anticipated discharge in approximately 3-8 day(s).   Ashlee Dame, MD 11/02/2020, 7:01 AM Pager: [phone] After 5pm on weekdays and 1pm on weekends: On Call pager [phone]  This patient's plan of care was discussed with the house staff. Please see their note for complete details. I concur with their findings.

## 2020-11-02 NOTE — Plan of Care (Signed)

## 2020-11-02 NOTE — Progress Notes (Addendum)
[facility] Progress Note   Subjective:   Patient seen and examined at bedside.  Reports throbbing pain in L leg last night with associated nausea but improved today.  Denies SOB, CP, n/v/d, abdominal pain and fatigue.    Objective Vitals:   11/01/20 2352 11/02/20 0413 11/02/20 0725 11/02/20 1000  BP: (!) 153/49 (!) 124/38 (!) 120/47   Pulse: 66 61 62   Resp: '15 12 16   '$ Temp: 98.2 F (36.8 C) 98 F (36.7 C) 97.7 F (36.5 C)   TempSrc: Oral Oral Oral   SpO2: 100% 99% 99%   Weight:  94.3 kg    Height:    '5\' 4"'$  (1.626 m)   Physical Exam General:Well appearing female in NAD Heart:RRR, no mrg Lungs:mostly CTAB Abdomen:soft, NTND Extremities:trace LE edema,+tenderness to palpation Dialysis Access: L thigh AVG +bruit   Filed Weights   10/31/20 0335 11/01/20 0351 11/02/20 0413  Weight: 89 kg 89.6 kg 94.3 kg    Intake/Output Summary (Last 24 hours) at 11/02/2020 1113 Last data filed at 11/02/2020 0557 Gross per 24 hour  Intake 645.11 ml  Output --  Net 645.11 ml    Additional Objective Labs: Basic Metabolic Panel: Recent Labs  Lab 10/31/20 0119 11/01/20 0638 11/02/20 0235  NA 135 135 132*  K 3.8 4.0 4.5  CL 99 102 98  CO2 '29 26 26  '$ GLUCOSE 173* 109* 152*  BUN '10 19 22  '$ CREATININE 3.72* 5.72* 6.56*  CALCIUM 8.0* 8.4* 8.7*  PHOS 2.7 3.5 3.4   Liver Function Tests: Recent Labs  Lab 10/31/20 0119 11/01/20 0638 11/02/20 0235  ALBUMIN 2.4* 2.4* 2.6*   CBC: Recent Labs  Lab 10/29/20 0500 10/30/20 0219 10/31/20 0119 11/01/20 0943 11/02/20 0235  WBC 7.3 8.1 5.5 5.6 6.6  HGB 9.1* 9.8* 8.9* 9.7* 9.8*  HCT 27.6* 29.4* 26.7* 29.4* 30.6*  MCV 82.9 81.9 81.7 83.1 84.8  PLT 140* 160 155 169 189   CBG: Recent Labs  Lab 11/01/20 0634 11/01/20 1302 11/01/20 1740 11/01/20 2055 11/02/20 0611  GLUCAP 111* 112* 145* 157* 150*    Lab Results  Component Value Date   INR 1.5 (H) 11/02/2020   INR 1.4 (H) 11/01/2020   INR 1.3 (H) 10/31/2020     Medications: . heparin 1,150 Units/hr (11/01/20 2359)   . calcitRIOL  1 mcg Oral Q M,W,F-HD  . Chlorhexidine Gluconate Cloth  6 each Topical Daily  . Chlorhexidine Gluconate Cloth  6 each Topical Q0600  . cinacalcet  30 mg Oral Q M,W,F-HD  . darbepoetin (ARANESP) injection - DIALYSIS  150 mcg Intravenous Q Fri-HD  . famotidine  20 mg Oral Daily  . fentaNYL  1 patch Transdermal Q72H  . gabapentin  100 mg Oral QHS  . HYDROmorphone  2 mg Oral Q8H  . insulin aspart  0-6 Units Subcutaneous TID WC  . lidocaine-prilocaine   Topical QODAY  . pantoprazole  40 mg Oral BID AC  . sertraline  50 mg Oral Daily  . sevelamer carbonate  800 mg Oral TID WC  . simvastatin  40 mg Oral q1800  . sodium chloride flush  10-40 mL Intracatheter Q12H  . sodium chloride flush  3 mL Intravenous Q12H  . warfarin  10 mg Oral ONCE-1600  . Warfarin - Pharmacist Dosing Inpatient   Does not apply [id number]    Dialysis Orders: MWF NW 4h 4/00/500 90.5kg 3K/2.5 bath Hep none L thigh AVG - mircera 100 q2 last 4/29, due 5/13 - calcitriol  1 ug po tiw  Assessment/Plan: 1. AV graft eschar - recent significant bleeding event at home. s/p revision of left thigh AVG w/interposition graft on 5/8 by Dr. Carlis Abbott. See VVS instructions page (up in HD unit) for where to stick the AVG (medially) for next 4- 6 wks (thru June 6-20) while incision heals.   2. ESRD - on HD MWF. HD today per regular schedule.  3. BP/ volume - BP variable with hypotension on HD. No meds here.  Does not appear grossly volume overloaded on exam.  Was getting a little under dry weight.  Get standing weights for accuracy.  4. Anemiaof ESRD/ABLA  ---Hgb6.8 on admit.  now improved to 9.8 s/p 1U PRBCs on 5/6. Got IV darbe 150 ug last 5/13.  5. MBD - Corr Ca/Phos ok. Continue binder, VDRA/sensipar q HD 6. DM2 - per pmd 7. Prot C deficiency/Hx DVT/PE -- On AC. Heparin/warfarin per pharmacy, awaiting therapeutic INR 2-3 8. Dispo - awaiting SNF  placement   Jen Mow, PA-C [place] 11/02/2020,11:13 AM  LOS: 10 days

## 2020-11-02 NOTE — Care Management Important Message (Signed)
Important Message  Patient Details  Name: Ashlee Miles MRN: [id number] Date of Birth: Jun 30, 1940   Medicare Important Message Given:  Yes     Shelda Altes 11/02/2020, 10:59 AM

## 2020-11-02 NOTE — Progress Notes (Signed)
Occupational Therapy Treatment Patient Details Name: Ashlee Miles MRN: [id number] DOB: 01-23-1941 Today's Date: 11/02/2020    History of present illness Ashlee Miles is 80yo person living with ESRD on MWF HD, HFpEF, T2DM, protein C deficiency, hx DVT/PE on Coumadin presenting for evaluation of fistula due to profuse bleeding from her left thigh fistula. Per chart review, she was evaluated in the ED on 5/1 for this issue. Pt missing a few HD sessions.  Denies fevers, chills, chest pain, dyspnea, cough, nausea, vomiting, abdominal pain, constipation, dysuria; an episode of diarrhea last week   OT comments  Pt making progress with functional goals, states that she is feeling better. Session focused on bed mobility to sit EOB, UB dressing, sit - stand with RW, ambulating x 3 ft to [facility], toilet transfers, toileting tasks, standing for peri hygiene and standing to wash and dry hands and face. OT will continue to follow acutely to maximize level of function and safety  Follow Up Recommendations  SNF    Equipment Recommendations  Other (comment) (TBD at SNF)    Recommendations for Other Services      Precautions / Restrictions Precautions Precautions: Fall Restrictions Weight Bearing Restrictions: No       Mobility Bed Mobility Overal bed mobility: Needs Assistance Bed Mobility: Supine to Sit;Sit to Supine     Supine to sit: Mod assist Sit to supine: Mod assist   General bed mobility comments: mod A to elevate trunk and  for LEs back onto bed. min verbal cues to scoot hips forward sitting EOB. Required increased time and effort    Transfers Overall transfer level: Needs assistance Equipment used: Rolling walker (2 wheeled) Transfers: Sit to/from [facility] Sit to Stand: Mod assist Stand pivot transfers: Min assist       General transfer comment: cues for hand placement and assist to boost and also come forward.    Balance Overall balance assessment: Needs  assistance Sitting-balance support: No upper extremity supported;Feet supported Sitting balance-[name]: Fair     Standing balance support: Bilateral upper extremity supported;During functional activity Standing balance-[name]: Poor                             ADL either performed or assessed with clinical judgement   ADL Overall ADL's : Needs assistance/impaired     Grooming: Wash/dry hands;Wash/dry face;Min guard;Standing       Lower Body Bathing: Moderate assistance;Sitting/lateral leans Lower Body Bathing Details (indicate cue type and reason): simulated seated EOB Upper Body Dressing : Set up;Supervision/safety;Sitting Upper Body Dressing Details (indicate cue type and reason): donned clean gown     Toilet Transfer: Moderate assistance;Minimal assistance;Ambulation;RW;Stand-pivot;BSC;Cueing for safety   Toileting- Clothing Manipulation and Hygiene: Minimal assistance;Sit to/from stand       Functional mobility during ADLs: Moderate assistance;Minimal assistance;Cueing for safety;Rolling walker       Vision Baseline Vision/History: Wears glasses Wears Glasses: At all times Patient Visual Report: No change from baseline     Perception     Praxis      Cognition Arousal/Alertness: Awake/alert Behavior During Therapy: WFL for tasks assessed/performed Overall Cognitive Status: No family/caregiver present to determine baseline cognitive functioning                                          Exercises     Shoulder  Instructions       General Comments      Pertinent Vitals/ Pain       Pain Assessment: No/denies pain  Home Living                                          Prior Functioning/Environment              Frequency  Min 2X/week        Progress Toward Goals  OT Goals(current goals can now be found in the care plan section)  Progress towards OT goals: Progressing toward goals  Acute  Rehab OT Goals Patient Stated Goal: get some rehab at SNF before coming home.  Plan Discharge plan remains appropriate    Co-evaluation                 AM-PAC OT "6 Clicks" Daily Activity     Outcome Measure   Help from another person eating meals?: None Help from another person taking care of personal grooming?: A Little Help from another person toileting, which includes using toliet, bedpan, or urinal?: A Little Help from another person bathing (including washing, rinsing, drying)?: A Lot Help from another person to put on and taking off regular upper body clothing?: A Little Help from another person to put on and taking off regular lower body clothing?: A Lot 6 Click Score: 17    End of Session Equipment Utilized During Treatment: Gait belt;Rolling walker;Other (comment) (BSC)  OT Visit Diagnosis: Other abnormalities of gait and mobility (R26.89);Unsteadiness on feet (R26.81);Muscle weakness (generalized) (M62.81);Other symptoms and signs involving cognitive function   Activity Tolerance Patient tolerated treatment well   Patient Left in bed;with call bell/phone within reach;with bed alarm set   Nurse Communication          Time: [id number] OT Time Calculation (min): 19 min  Charges: OT General Charges $OT Visit: 1 Visit OT Treatments $Self Care/Home Management : 8-22 mins     Britt Bottom 11/02/2020, 3:09 PM

## 2020-11-03 DIAGNOSIS — N186 End stage renal disease: Secondary | ICD-10-CM | POA: Diagnosis not present

## 2020-11-03 DIAGNOSIS — I10 Essential (primary) hypertension: Secondary | ICD-10-CM

## 2020-11-03 DIAGNOSIS — D6859 Other primary thrombophilia: Secondary | ICD-10-CM | POA: Diagnosis not present

## 2020-11-03 DIAGNOSIS — T829XXA Unspecified complication of cardiac and vascular prosthetic device, implant and graft, initial encounter: Secondary | ICD-10-CM | POA: Diagnosis not present

## 2020-11-03 LAB — RENAL FUNCTION PANEL
Albumin: 2.4 g/dL — ABNORMAL LOW (ref 3.5–5.0)
Anion gap: 8 (ref 5–15)
BUN: 28 mg/dL — ABNORMAL HIGH (ref 8–23)
CO2: 27 mmol/L (ref 22–32)
Calcium: 8.4 mg/dL — ABNORMAL LOW (ref 8.9–10.3)
Chloride: 97 mmol/L — ABNORMAL LOW (ref 98–111)
Creatinine, Ser: 8.33 mg/dL — ABNORMAL HIGH (ref 0.44–1.00)
GFR, Estimated: 5 mL/min — ABNORMAL LOW (ref >60)
Glucose, Bld: 106 mg/dL — ABNORMAL HIGH (ref 70–99)
Phosphorus: 3.9 mg/dL (ref 2.5–4.6)
Potassium: 4.6 mmol/L (ref 3.5–5.1)
Sodium: 132 mmol/L — ABNORMAL LOW (ref 135–145)

## 2020-11-03 LAB — CBC
HCT: 28.6 % — ABNORMAL LOW (ref 36.0–46.0)
Hemoglobin: 9.2 g/dL — ABNORMAL LOW (ref 12.0–15.0)
MCH: 26.8 pg (ref 26.0–34.0)
MCHC: 32.2 g/dL (ref 30.0–36.0)
MCV: 83.4 fL (ref 80.0–100.0)
Platelets: 186 10*3/uL (ref 150–400)
RBC: 3.43 MIL/uL — ABNORMAL LOW (ref 3.87–5.11)
RDW: 16.4 % — ABNORMAL HIGH (ref 11.5–15.5)
WBC: 5.8 10*3/uL (ref 4.0–10.5)
nRBC: 0 % (ref 0.0–0.2)

## 2020-11-03 LAB — GLUCOSE, CAPILLARY
Glucose-Capillary: 120 mg/dL — ABNORMAL HIGH (ref 70–99)
Glucose-Capillary: 135 mg/dL — ABNORMAL HIGH (ref 70–99)
Glucose-Capillary: 151 mg/dL — ABNORMAL HIGH (ref 70–99)
Glucose-Capillary: 124 mg/dL — ABNORMAL HIGH (ref 70–99)

## 2020-11-03 LAB — PROTIME-INR
INR: 1.9 — ABNORMAL HIGH (ref 0.8–1.2)
Prothrombin Time: 21.8 seconds — ABNORMAL HIGH (ref 11.4–15.2)

## 2020-11-03 LAB — HEPARIN LEVEL (UNFRACTIONATED): Heparin Unfractionated: 0.5 IU/mL (ref 0.30–0.70)

## 2020-11-03 MED ORDER — WARFARIN SODIUM 10 MG PO TABS
10.0000 mg | ORAL_TABLET | Freq: Once | ORAL | Status: AC
  Administered 2020-11-03: 10 mg via ORAL
  Filled 2020-11-03: qty 1

## 2020-11-03 MED ORDER — HYDROMORPHONE HCL 1 MG/ML IJ SOLN
0.5000 mg | Freq: Once | INTRAMUSCULAR | Status: AC | PRN
  Administered 2020-11-03: 0.5 mg via INTRAVENOUS
  Filled 2020-11-03: qty 1

## 2020-11-03 NOTE — TOC Progression Note (Signed)
Transition of Care [facility]) - Progression Note    Patient Details  Name: Ashlee Miles MRN: [id number] Date of Birth: Aug 21, 1940  Transition of Care [facility]) CM/SW [place], [place] Phone Number: 11/03/2020, 3:18 PM  Clinical Narrative:     Authorization started reference # [id number]  Thurmond Butts, MSW, LCSW Clinical Social Worker    Expected Discharge Plan: Skilled Nursing Facility Barriers to Discharge: Continued Medical Work up  Expected Discharge Plan and Services Expected Discharge Plan: [place]: [place] arrangements for the past 2 months: Single Family Home                                       Social Determinants of Health (SDOH) Interventions    Readmission Risk Interventions Readmission Risk Prevention Plan 03/25/2020  Transportation Screening Complete  Palliative Care Screening Not Applicable  Skilled Nursing Facility Complete  Some recent data might be hidden

## 2020-11-03 NOTE — Progress Notes (Signed)
ANTICOAGULATION CONSULT NOTE - Follow Up Consult  Pharmacy Consult for Heparin >> Warfarin Indication: hx DVT/PE, Protein C deficiency  No Known Allergies  Patient Measurements: Height: '5\' 4"'$  (162.6 cm) Weight: 91.2 kg (201 lb 1 oz) IBW/kg (Calculated) : 54.7 Heparin Dosing Weight: 80 kg  Vital Signs: Temp: 98.1 F (36.7 C) (05/17 0825) Temp Source: Oral (05/17 0825) BP: 114/48 (05/17 0825) Pulse Rate: 69 (05/17 0825)  Labs: Recent Labs    11/01/20 [id number] 11/01/20 0943 11/01/20 0943 11/01/20 1010 11/02/20 0235 11/03/20 0600  HGB  --  9.7*   < >  --  9.8* 9.2*  HCT  --  29.4*  --   --  30.6* 28.6*  PLT  --  169  --   --  189 186  LABPROT  --   --   --  16.7* 17.6* 21.8*  INR  --   --   --  1.4* 1.5* 1.9*  HEPARINUNFRC  --  0.58  --   --  0.44 0.50  CREATININE 5.72*  --   --   --  6.56* 8.33*   < > = values in this interval not displayed.   ESRD  Assessment: 80 y.o. female on warfarin PTA for hx PE/DVT and protein C deficiency. Warfarin held for procedure, resumed 5/9. On IV heparin bridge.   Heparin stopped and warfarin held 5/10 afternoon after nursing reported bleeding from left thigh graft. Per vascular bleeding from cannulation site and no bleeding from surgical site revision and bleeding has resolved. Heparin and warfarin restarted 5/11 pm.   Heparin level remains therapeutic (0.50) on 1150 units/hr. INR 1.9, slow to increase. Has had 5 mg > 7.5 mg > 10 mg x 2 over the last 4 days.  PTA Warfarin dose:  Outpatient pharmacy reports last filled (LF) dates for warfarin as follows: Warfarin 2 mg LF date 08/19/20 and 5 mg LF date 08/06/20, previously attempted to confirm home medication /dosage with patient's family member.  On 5/15, reported 10 mg daily except 5 mg on Sunday and Thursday. .  Goal of Therapy:  INR 2-3 Heparin level 0.3-0.7 units/ml Monitor platelets by anticoagulation protocol: Yes   Plan:  Continue heparin drip at 1150 units/hr Warfarin 10 mg x 1  again today. Daily heparin level, PT/INR and CBC.  Arty Baumgartner, [place] 11/03/2020,10:03 AM

## 2020-11-03 NOTE — Progress Notes (Signed)
Dr. Collene Gobble and Dr. Jonnie Finner notified that patient is refusing to go to dialysis today due to severe left leg pain. Dr. Collene Gobble has ordered a one time dose of IV Dilaudid to see if that will help with her pain. Dialysis will be rescheduled for tomorrow.

## 2020-11-03 NOTE — Progress Notes (Signed)
Mobility Specialist: Progress Note   11/03/20 1747  Mobility  Activity Dangled on edge of bed  Level of Assistance Minimal assist, patient does 75% or more  Assistive Device None  Mobility Sit up in bed/chair position for meals  Mobility Response Tolerated well  Mobility performed by Mobility specialist  $Mobility charge 1 Mobility   Post-Mobility: 69 HR, 142/56 BP  Pt was minA to transfer from supine to sitting EOB. Pt performed LE exercises while dangling on the edge of the bed, c/o 8-9/10 pain in LLE. Pt otherwise asx. Pt sitting EOB to eat dinner, RN notified.   [facility] Odyssey Vasbinder Mobility Specialist Mobility Specialist Phone: [phone]

## 2020-11-03 NOTE — Progress Notes (Signed)
[facility] Progress Note   Subjective:   Patient seen and examined at bedside.  Waiting to go to dialysis.  Denies CP, SOB, edema, orthopnea, and n/v/d.  Reports pain is mostly well controlled on my exam but a little while later nurse reported worsened pain and patient refusing to go to dialysis.    Objective Vitals:   11/03/20 0407 11/03/20 0522 11/03/20 0825 11/03/20 1145  BP: (!) 122/40  (!) 114/48 (!) 146/52  Pulse: (!) 57 (!) 55 69 69  Resp: '10 12 18 12  '$ Temp: (!) 97.5 F (36.4 C)  98.1 F (36.7 C) 98.1 F (36.7 C)  TempSrc: Oral  Oral Oral  SpO2: 100% 100% 99% 100%  Weight:  91.2 kg    Height:       Physical Exam General:well appearing female in NAD Heart:RRR Lungs:CTAB, nml WOB on RA Abdomen:soft, NTND Extremities:no LE edema Dialysis Access: L thigh AVG +b/t, +tenderness to palpation   Filed Weights   11/01/20 0351 11/02/20 0413 11/03/20 0522  Weight: 89.6 kg 94.3 kg 91.2 kg    Intake/Output Summary (Last 24 hours) at 11/03/2020 1252 Last data filed at 11/03/2020 0845 Gross per 24 hour  Intake 240 ml  Output --  Net 240 ml    Additional Objective Labs: Basic Metabolic Panel: Recent Labs  Lab 11/01/20 0638 11/02/20 0235 11/03/20 0600  NA 135 132* 132*  K 4.0 4.5 4.6  CL 102 98 97*  CO2 '26 26 27  '$ GLUCOSE 109* 152* 106*  BUN 19 22 28*  CREATININE 5.72* 6.56* 8.33*  CALCIUM 8.4* 8.7* 8.4*  PHOS 3.5 3.4 3.9   Liver Function Tests: Recent Labs  Lab 11/01/20 0638 11/02/20 0235 11/03/20 0600  ALBUMIN 2.4* 2.6* 2.4*   CBC: Recent Labs  Lab 10/30/20 0219 10/31/20 0119 11/01/20 0943 11/02/20 0235 11/03/20 0600  WBC 8.1 5.5 5.6 6.6 5.8  HGB 9.8* 8.9* 9.7* 9.8* 9.2*  HCT 29.4* 26.7* 29.4* 30.6* 28.6*  MCV 81.9 81.7 83.1 84.8 83.4  PLT 160 155 169 189 186    CBG: Recent Labs  Lab 11/02/20 1251 11/02/20 1632 11/02/20 2114 11/03/20 0618 11/03/20 1140  GLUCAP 128* 142* 158* 120* 135*    Medications: . heparin 1,150  Units/hr (11/03/20 0227)   . calcitRIOL  1 mcg Oral Q M,W,F-HD  . Chlorhexidine Gluconate Cloth  6 each Topical Daily  . Chlorhexidine Gluconate Cloth  6 each Topical Q0600  . cinacalcet  30 mg Oral Q M,W,F-HD  . darbepoetin (ARANESP) injection - DIALYSIS  150 mcg Intravenous Q Fri-HD  . famotidine  20 mg Oral Daily  . fentaNYL  1 patch Transdermal Q72H  . gabapentin  100 mg Oral QHS  . HYDROmorphone  2 mg Oral Q8H  . insulin aspart  0-6 Units Subcutaneous TID WC  . lidocaine-prilocaine   Topical QODAY  . pantoprazole  40 mg Oral BID AC  . sertraline  50 mg Oral Daily  . sevelamer carbonate  800 mg Oral TID WC  . simvastatin  40 mg Oral q1800  . sodium chloride flush  10-40 mL Intracatheter Q12H  . sodium chloride flush  3 mL Intravenous Q12H  . warfarin  10 mg Oral ONCE-1600  . Warfarin - Pharmacist Dosing Inpatient   Does not apply q1600    Dialysis Orders: MWF NW 4h 4/00/500 90.5kg 3K/2.5 bath Hep none L thigh AVG - mircera 100 q2 last 4/29, due 5/13 - calcitriol 1 ug po tiw  Assessment/Plan: 1. AV  graft eschar - recent significant bleeding event at home. s/p revision of left thigh AVG w/interposition graft on 5/8 by Dr. Carlis Abbott. See VVS instructions page (up in HD unit) for where to stick the AVG (medially) for next 4- 6 wks (thru June 6-20) while incision heals. Pain control per primary.  2. ESRD - on HD MWF. Roll over HD from yesterday due to high patient census.  Patient refused to go when they came to pick up due to pain surrounding her AVG.  No signs of respiratory distress, labs stable (k 4.6, BUN 28, Scr 8.33) no indications for urgent HD today.  Plan to more dialysis tomorrow 1st shift and resume regular schedule.  3. BP/ volume - BP variable with hypotension on HD. No meds here.  Does not appear grossly volume overloaded on exam.  Was getting a little under dry weight.  Get standing weights for accuracy if able.  4. Anemiaof ESRD/ABLA - Hgb6.8 on admit. now  improved to 9.2 s/p 1U PRBCs on 5/6. Got IV darbe 150 ug last 5/13.  5. MBD - Corr Ca/Phos ok. Continue binder, VDRA/sensipar q HD 6. DM2 - per pmd 7. Prot C deficiency/Hx DVT/PE -- On AC. Heparin/warfarin per pharmacy, awaiting therapeutic INR 2-3 8. Dispo - awaiting SNF placement   Jen Mow, PA-C [place] 11/03/2020,12:52 PM  LOS: 11 days

## 2020-11-03 NOTE — Progress Notes (Signed)
Subjective:   Patient evaluated at bedside this AM. Doing okay this morning, no further episodes of bleeding. She still has some pain in the left leg. Discussed discharge to rehab for which patient is agreeable but notes that she would be more excited if she was going home. Notes left leg still hurts and is more swollen.   Objective:  Vital signs in last 24 hours: Vitals:   11/02/20 2026 11/02/20 2336 11/03/20 0407 11/03/20 0522  BP: (!) 135/39 (!) 125/44 (!) 122/40   Pulse: 60 (!) 58 (!) 57 (!) 55  Resp: '16 11 10 12  '$ Temp: 97.8 F (36.6 C) (!) 97.5 F (36.4 C) (!) 97.5 F (36.4 C)   TempSrc: Oral Oral Oral   SpO2: 100% 99% 100% 100%  Weight:    91.2 kg  Height:       Physical Exam: General: Laying in bed, no acute distress CV: Regular rate, rhythm. No m/r/g appreciated Pulm: Normal work of breathing on room air Abdomen: Soft, mildly tender, non-distended. Normoactive bowel sounds. Skin: Surgical sites clean, dry, in tact. No bleeding or purulence noted.  CBC Latest Ref Rng & Units 11/03/2020 11/02/2020 11/01/2020  WBC 4.0 - 10.5 K/uL 5.8 6.6 5.6  Hemoglobin 12.0 - 15.0 g/dL 9.2(L) 9.8(L) 9.7(L)  Hematocrit 36.0 - 46.0 % 28.6(L) 30.6(L) 29.4(L)  Platelets 150 - 400 K/uL 186 189 169   BMP Latest Ref Rng & Units 11/03/2020 11/02/2020 11/01/2020  Glucose 70 - 99 mg/dL 106(H) 152(H) 109(H)  BUN 8 - 23 mg/dL 28(H) 22 19  Creatinine 0.44 - 1.00 mg/dL 8.33(H) 6.56(H) 5.72(H)  Sodium 135 - 145 mmol/L 132(L) 132(L) 135  Potassium 3.5 - 5.1 mmol/L 4.6 4.5 4.0  Chloride 98 - 111 mmol/L 97(L) 98 102  CO2 22 - 32 mmol/L '27 26 26  '$ Calcium 8.9 - 10.3 mg/dL 8.4(L) 8.7(L) 8.4(L)   Assessment/Plan: Ashlee Miles is 80yo person living with ESRD on MWF HD, HFpEF, T2DM, protein C deficiency with prior DVT on Coumadin at home admitted 5/5 with AV graft ulceration, now POD9 s/p revision, briding anticoagulant to therapeutic INR, pending SNF placement.   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 This morning received message that patient is refusing dialysis today 2/2 left leg pain. Per nephrology will hold off dialysis for another day. Per CSW patient has bed available but pending insurance authorization. At this point expect patient to be discharged on Thursday, her non-dialysis day. BP management and volume control per HD. Continues to appear euvolemic, hemodynamically stable. - HD tomorrow - Lidocaine cream 49mn-1hr prior to HD - Medial access on HD until 6/20 - Daily BMP, CBC  #Subtherapeutic INR #Protein C deficiency with prior DVT  INR 1.9 this AM, heparin at goal. Will continue heparin one more day, expect INR to be therapeutic in AM and stop heparin in the morning with plans for discharge on Thursday. Appreciate pharmacy assistance and recommendations. - Heparin gtt - Warfarin per pharmacy - Goal INR 2-3 - Daily CBC, INR, heparin level  #Chronic opioid use disorder Patient is  experiencing some left thigh pain this morning, unable to undergo HD. Will give extra dose of IV dilaudid this AM, continue with current regimen. - IV dilaudid 0.'5mg'$  once - PO dilaudid '2mg'$  q8h - Fentanyl patch 12 mcg/hr - Hold & Call MD if SBP<90, HR<65, RR<10, O2<90, or altered mental status.  #Deconditioning Patient working with physical therapy. Mentioned this morning she has been getting out of bed some and walking around. Planning for SNF, currently awaiting insurance authorization. - PT/OT - SNF placement  #Anemia of chronic disease Hgb stable, no further bleeding events.  Received Aranesp last Friday. - Daily CBC  DIET: CM IVF: n/a DVT PPX: warfarin, heparin BOWEL: n/a CODE: DNR/DNI FAM COM: Will touch base with her this week prior to discharge.  Prior to Admission Living Arrangement: Home Anticipated Discharge Location: SNF Barriers to Discharge: INR goal, SNF placement Dispo: Anticipated discharge in approximately 2 day(s).   [name], MD 11/03/2020, 5:58 AM Pager: [phone] After 5pm on weekdays and 1pm on weekends: On Call pager [phone]

## 2020-11-04 LAB — GLUCOSE, CAPILLARY
Glucose-Capillary: 90 mg/dL (ref 70–99)
Glucose-Capillary: 68 mg/dL — ABNORMAL LOW (ref 70–99)
Glucose-Capillary: 69 mg/dL — ABNORMAL LOW (ref 70–99)
Glucose-Capillary: 182 mg/dL — ABNORMAL HIGH (ref 70–99)
Glucose-Capillary: 149 mg/dL — ABNORMAL HIGH (ref 70–99)

## 2020-11-04 LAB — RENAL FUNCTION PANEL
Albumin: 2.6 g/dL — ABNORMAL LOW (ref 3.5–5.0)
Anion gap: 7 (ref 5–15)
BUN: 34 mg/dL — ABNORMAL HIGH (ref 8–23)
CO2: 27 mmol/L (ref 22–32)
Calcium: 8.9 mg/dL (ref 8.9–10.3)
Chloride: 96 mmol/L — ABNORMAL LOW (ref 98–111)
Creatinine, Ser: 9.2 mg/dL — ABNORMAL HIGH (ref 0.44–1.00)
GFR, Estimated: 4 mL/min — ABNORMAL LOW (ref >60)
Glucose, Bld: 100 mg/dL — ABNORMAL HIGH (ref 70–99)
Phosphorus: 3.9 mg/dL (ref 2.5–4.6)
Potassium: 5.1 mmol/L (ref 3.5–5.1)
Sodium: 130 mmol/L — ABNORMAL LOW (ref 135–145)

## 2020-11-04 LAB — CBC
HCT: 28.8 % — ABNORMAL LOW (ref 36.0–46.0)
Hemoglobin: 9.4 g/dL — ABNORMAL LOW (ref 12.0–15.0)
MCH: 27.2 pg (ref 26.0–34.0)
MCHC: 32.6 g/dL (ref 30.0–36.0)
MCV: 83.2 fL (ref 80.0–100.0)
Platelets: 203 10*3/uL (ref 150–400)
RBC: 3.46 MIL/uL — ABNORMAL LOW (ref 3.87–5.11)
RDW: 16.8 % — ABNORMAL HIGH (ref 11.5–15.5)
WBC: 6.4 10*3/uL (ref 4.0–10.5)
nRBC: 0 % (ref 0.0–0.2)

## 2020-11-04 LAB — HEPARIN LEVEL (UNFRACTIONATED): Heparin Unfractionated: 0.47 [IU]/mL (ref 0.30–0.70)

## 2020-11-04 LAB — PROTIME-INR
INR: 2.4 — ABNORMAL HIGH (ref 0.8–1.2)
Prothrombin Time: 26.3 s — ABNORMAL HIGH (ref 11.4–15.2)

## 2020-11-04 LAB — SARS CORONAVIRUS 2 (TAT 6-24 HRS): SARS Coronavirus 2: NEGATIVE

## 2020-11-04 MED ORDER — WARFARIN SODIUM 7.5 MG PO TABS
7.5000 mg | ORAL_TABLET | Freq: Once | ORAL | Status: AC
  Administered 2020-11-04: 7.5 mg via ORAL
  Filled 2020-11-04: qty 1

## 2020-11-04 NOTE — Progress Notes (Addendum)
Hemodialysis- Tolerated well without issue. UF goal 2L met. Pressure held to avg x15-36mns via arterial cannulation site, surgifoam applied as well as mild pressure dressing. Report called to primary RN. Patient notified not to remove dressing until tomorrow am.

## 2020-11-04 NOTE — Progress Notes (Signed)
Renal Navigator received update from [facility] CSW/C. Wynetta Emery today that patient has [facility] and [employer] is requesting that Renal Navigator arrange first 3 transports, even though this is not a new service for her. Navigator agreed and has scheduled transportation for Friday, 5/20, Monday, 5/23, and Wednesday, 5/25. Pick up window from [employer] to [facility] is between 9:20-9:50am for her 10:50am lobby time (11:10am chair time). Her return to [facility] from HD Clinic is between 3:45-4:15pm (which is what [facility] said is her normal pick up window).  Alphonzo Cruise, [place] Renal Navigator [phone]

## 2020-11-04 NOTE — Progress Notes (Signed)
ANTICOAGULATION CONSULT NOTE - Follow Up Consult  Pharmacy Consult for Heparin >> Warfarin Indication: hx DVT/PE, Protein C deficiency  No Known Allergies  Patient Measurements: Height: '5\' 4"'$  (162.6 cm) Weight: 89.2 kg (196 lb 10.4 oz) IBW/kg (Calculated) : 54.7 Heparin Dosing Weight: 80 kg  Vital Signs: Temp: 97.6 F (36.4 C) (05/18 1220) Temp Source: Oral (05/18 1220) BP: 155/47 (05/18 1220) Pulse Rate: 72 (05/18 1220)  Labs: Recent Labs    11/02/20 0235 11/03/20 0600 11/04/20 0245  HGB 9.8* 9.2* 9.4*  HCT 30.6* 28.6* 28.8*  PLT 189 186 203  LABPROT 17.6* 21.8* 26.3*  INR 1.5* 1.9* 2.4*  HEPARINUNFRC 0.44 0.50 0.47  CREATININE 6.56* 8.33* 9.20*   ESRD  Assessment: 80 y.o. female on warfarin PTA for hx PE/DVT and protein C deficiency. Warfarin held for procedure, resumed 5/9. On IV heparin bridge.   Heparin stopped and warfarin held 5/10 afternoon after nursing reported bleeding from left thigh graft. Per vascular bleeding from cannulation site and no bleeding from surgical site revision and bleeding has resolved. Heparin and warfarin restarted 5/11 pm.   Heparin level remains therapeutic (0.47) on 1150 units/hr. INR 1.4>2.4 at goal and big jumps after starting PTA dose of warafrin '10mg'$  daily x3 doses No bleeding noted and cbc stable  Concern with continuing warfarin '10mg'$  - will back down to 7.'5mg'$  tonight and re-evaluate in am  PTA Warfarin dose:  Outpatient pharmacy reports last filled (LF) dates for warfarin as follows: Warfarin 2 mg LF date 08/19/20 and 5 mg LF date 08/06/20, previously attempted to confirm home medication /dosage with patient's family member.  On 5/15, reported 10 mg daily except 5 mg on Sunday and Thursday. .  Goal of Therapy:  INR 2-3 Heparin level 0.3-0.7 units/ml Monitor platelets by anticoagulation protocol: Yes   Plan:  Stop heparin drip with INR at goal > 2 Warfarin 7.5 mg x 1  today. Daily heparin level, PT/INR and CBC.  Bonnita Nasuti Pharm.D. CPP, BCPS Clinical Pharmacist [phone] 11/04/2020 1:07 PM

## 2020-11-04 NOTE — Progress Notes (Signed)
Mobility Specialist: Progress Note   11/04/20 1606  Mobility  Activity Ambulated in room  Level of Assistance +2 (takes two people)  Games developer wheel walker  Distance Ambulated (ft) 6 ft (3'x2)  Mobility Ambulated with assistance in room  Mobility Response Tolerated well  Mobility performed by Mobility specialist;Other (comment) (PTA C[place])  $Mobility charge 1 Mobility   Pre-Mobility: 65 HR  Pt +2 assist for safety and equipment. Pt was assisted to get standing weight. Pt differed further mobility due to fatigue from HD earlier today. Pt back to bed after session per request with call bell and phone at her side.   [facility] Adylene Dlugosz Mobility Specialist Mobility Specialist Phone: [phone]

## 2020-11-04 NOTE — Progress Notes (Signed)
Physical Therapy Treatment Patient Details Name: Ashlee Miles MRN: [id number] DOB: [birth year] Today's Date: 11/04/2020    History of Present Illness Ashlee Miles is 80yo person presenting for evaluation of fistula due to profuse bleeding from her left thigh fistula. Per chart review, she was evaluated in the ED on 5/1 for this issue. Pt missing a few HD sessions. Denies fevers, chills, chest pain, dyspnea, cough, nausea, vomiting, abdominal pain, constipation, dysuria; an episode of diarrhea last week. PMH: ESRD on MWF HD, HFpEF, T2DM, protein C deficiency, hx DVT/PE on Coumadin    PT Comments    Pt received in supine, agreeable to therapy session and with good participation and fair tolerance for short gait task at bedside. Pt agreeable to ambulate to/from standing scale for weight but deferred transfer to chair due to significant fatigue after hemodialysis. Pt continue to require +2 min to modA for all functional mobility tasks. Emphasis on self-assist with bed mobility, safety with sit<>stand transfers, supine HEP for strengthening (bring handout next session), importance of OOB to chair during the day for hemodynamics and improved lung function/strengthening. Pt continues to benefit from PT services to progress toward functional mobility goals. Continue to recommend SNF.   Follow Up Recommendations  SNF;Supervision/Assistance - 24 hour     Equipment Recommendations  None recommended by PT;Other (comment)    Recommendations for Other Services       Precautions / Restrictions Precautions Precautions: Fall Restrictions Weight Bearing Restrictions: No    Mobility  Bed Mobility Overal bed mobility: Needs Assistance Bed Mobility: Supine to Sit;Sit to Supine;Rolling Rolling: Min assist   Supine to sit: Min assist;HOB elevated;+2 for physical assistance Sit to supine: Mod assist   General bed mobility comments: minA to elevate trunk with heavy instruction for technique and use of  both bed rails and modA for BLEs back onto bed. Pt able to advance hips while seated EOB with minA to min guard assist. Required increased time and effort to perform and cues throughout for self-assist.    Transfers Overall transfer level: Needs assistance Equipment used: Rolling walker (2 wheeled) Transfers: Sit to/from [facility] Sit to Stand: Min assist;+2 physical assistance         General transfer comment: cues for hand placement and minA for steadying upon standing at RW due to posterior bias; needs to use momentum strategy to achieve upright.  Ambulation/Gait Ambulation/Gait assistance: Min assist;+2 safety/equipment Gait Distance (Feet): 8 Feet (64f x2 with standing break at scale) Assistive device: Rolling walker (2 wheeled) Gait Pattern/deviations: Step-to pattern;Shuffle;Leaning posteriorly (forward head/downward gaze)     General Gait Details: cues for posture needed, minA at most for stability; very slow, guarded steps, mild posterior LOB and reliant on RW for support; from EOB<>standing scale placed ~473ffrom bed then lateral steps x2 (each LE) toward HOB.   Stairs             Wheelchair Mobility    Modified Rankin (Stroke Patients Only)       Balance Overall balance assessment: Needs assistance Sitting-balance support: No upper extremity supported;Feet supported Sitting balance-[name]: Fair Sitting balance - Comments: pt able to sit and weight shift at EOB no LOB   Standing balance support: Bilateral upper extremity supported;During functional activity Standing balance-[name]: Poor Standing balance comment: reliant on RW and external assist, pt verbalized fear of falls  Cognition Arousal/Alertness: Awake/alert Behavior During Therapy: WFL for tasks assessed/performed Overall Cognitive Status: No family/caregiver present to determine baseline cognitive functioning                                  General Comments: hx of cognitive impairments, pt following 1-step commands with increased time, pt somewhat self-directed as well but does better with familiar staff members. remembers this therapist from previous session.      Exercises General Exercises - Lower Extremity Ankle Circles/Pumps: AROM;Both;15 reps;Supine Quad Sets: AAROM;Both;10 reps;Supine (tcs for technique) Gluteal Sets: AROM;Both;5 reps;Supine    General Comments General comments (skin integrity, edema, etc.): L anterior thigh pressure dressing clean/dry/intact no visible drainage; HR 65 bpm seated EOB; no acute distress and VSS per chart review, not further assessed      Pertinent Vitals/Pain Pain Assessment: Faces Faces Pain Scale: Hurts a little bit Pain Location: L thigh/groin where pressure dressing is from HD access Pain Descriptors / Indicators: Discomfort;Grimacing Pain Intervention(s): Monitored during session;Repositioned;Limited activity within patient's tolerance    Home Living                      Prior Function            PT Goals (current goals can now be found in the care plan section) Acute Rehab PT Goals Patient Stated Goal: get some rehab at SNF before coming home. PT Goal Formulation: With patient Time For Goal Achievement: 11/12/20 Potential to Achieve Goals: Good Progress towards PT goals: Progressing toward goals    Frequency    Min 3X/week      PT Plan Current plan remains appropriate    Co-evaluation              AM-PAC PT "6 Clicks" Mobility   Outcome Measure  Help needed turning from your back to your side while in a flat bed without using bedrails?: A Little Help needed moving from lying on your back to sitting on the side of a flat bed without using bedrails?: A Lot Help needed moving to and from a bed to a chair (including a wheelchair)?: A Lot Help needed standing up from a chair using your arms (e.g., wheelchair or bedside  chair)?: A Lot Help needed to walk in hospital room?: A Lot (+2 assist for balance although minA) Help needed climbing 3-5 steps with a railing? : A Lot 6 Click Score: 13    End of Session Equipment Utilized During Treatment: Gait belt Activity Tolerance: Patient tolerated treatment well;Patient limited by fatigue (tired after HD session) Patient left: in bed;with call bell/phone within reach;with bed alarm set (B heels floated) Nurse Communication: Mobility status;Other (comment) (standing weight taken) PT Visit Diagnosis: Unsteadiness on feet (R26.81);Other abnormalities of gait and mobility (R26.89);Muscle weakness (generalized) (M62.81);Difficulty in walking, not elsewhere classified (R26.2);Pain Pain - Right/Left: Left Pain - part of body: Leg     Time: [id number] PT Time Calculation (min) (ACUTE ONLY): 17 min  Charges:  $Gait Training: 8-22 mins                     Linsi Humann P., PTA Acute Rehabilitation Services Pager: [phone] Office: [place] 11/04/2020, 4:28 PM

## 2020-11-04 NOTE — Plan of Care (Signed)

## 2020-11-04 NOTE — Progress Notes (Signed)
OT Cancellation Note  Patient Details Name: Ashlee Miles MRN: [id number] DOB: 03-Jun-1941   Cancelled Treatment:    Reason Eval/Treat Not Completed: Patient at procedure or test/ unavailable Pt off unit for HD this AM. Will follow-up for OT session as schedule permits.   Layla Maw 11/04/2020, 9:34 AM

## 2020-11-04 NOTE — Progress Notes (Signed)
[facility] Progress Note   Subjective:   Patient seen and examined in dialysis.  Pain in thigh improved today.  Denies CP, SOB, n/v/d and abdominal pain.   Objective Vitals:   11/04/20 0830 11/04/20 0900 11/04/20 0930 11/04/20 1000  BP: (!) 138/54 (!) 143/66 (!) 138/54 (!) 131/47  Pulse: (!) 56 (!) 58 (!) 58 (!) 55  Resp:      Temp:      TempSrc:      SpO2:      Weight:      Height:       Physical Exam General:well appearing female in NAD Heart:RRR, no mrg appreciated Lungs:CTAB, nml WOB on RA Abdomen:soft, NTND Extremities:no LE edema Dialysis Access: L thigh AVG accessed, +b/t in lateral limb as well, +tenderness to palpation  Filed Weights   11/02/20 0413 11/03/20 0522 11/04/20 0755  Weight: 94.3 kg 91.2 kg 91.2 kg    Intake/Output Summary (Last 24 hours) at 11/04/2020 1020 Last data filed at 11/03/2020 1637 Gross per 24 hour  Intake 459.04 ml  Output --  Net 459.04 ml    Additional Objective Labs: Basic Metabolic Panel: Recent Labs  Lab 11/02/20 0235 11/03/20 0600 11/04/20 0245  NA 132* 132* 130*  K 4.5 4.6 5.1  CL 98 97* 96*  CO2 '26 27 27  '$ GLUCOSE 152* 106* 100*  BUN 22 28* 34*  CREATININE 6.56* 8.33* 9.20*  CALCIUM 8.7* 8.4* 8.9  PHOS 3.4 3.9 3.9   Liver Function Tests: Recent Labs  Lab 11/02/20 0235 11/03/20 0600 11/04/20 0245  ALBUMIN 2.6* 2.4* 2.6*   CBC: Recent Labs  Lab 10/31/20 0119 11/01/20 0943 11/02/20 0235 11/03/20 0600 11/04/20 0245  WBC 5.5 5.6 6.6 5.8 6.4  HGB 8.9* 9.7* 9.8* 9.2* 9.4*  HCT 26.7* 29.4* 30.6* 28.6* 28.8*  MCV 81.7 83.1 84.8 83.4 83.2  PLT 155 169 189 186 203   CBG: Recent Labs  Lab 11/03/20 0618 11/03/20 1140 11/03/20 1729 11/03/20 2143 11/04/20 0623  GLUCAP 120* 135* 151* 124* 90    Medications:  . calcitRIOL  1 mcg Oral Q M,W,F-HD  . Chlorhexidine Gluconate Cloth  6 each Topical Daily  . Chlorhexidine Gluconate Cloth  6 each Topical Q0600  . cinacalcet  30 mg Oral Q  M,W,F-HD  . darbepoetin (ARANESP) injection - DIALYSIS  150 mcg Intravenous Q Fri-HD  . famotidine  20 mg Oral Daily  . fentaNYL  1 patch Transdermal Q72H  . gabapentin  100 mg Oral QHS  . HYDROmorphone  2 mg Oral Q8H  . insulin aspart  0-6 Units Subcutaneous TID WC  . lidocaine-prilocaine   Topical QODAY  . pantoprazole  40 mg Oral BID AC  . sertraline  50 mg Oral Daily  . sevelamer carbonate  800 mg Oral TID WC  . simvastatin  40 mg Oral q1800  . sodium chloride flush  10-40 mL Intracatheter Q12H  . sodium chloride flush  3 mL Intravenous Q12H  . Warfarin - Pharmacist Dosing Inpatient   Does not apply [id number]    Dialysis Orders: MWF NW 4h 4/00/500 90.5kg 3K/2.5 bath Hep none L thigh AVG - mircera 100 q2 last 4/29, due 5/13 - calcitriol 1 ug po tiw  Assessment/Plan: 1. AV graft eschar -recent significant bleeding event at home.s/p revision of left thigh AVG w/interposition grafton 5/8 byDr. Carlis Abbott. See VVS instructions page (up in HD unit) for where to stick the AVG (medially)for next 4- 6 wks (thru June 6-20) while incision heals.Pain  control per primary.  2. ESRD - on HD MWF.HD today per regular schedule.  Patient refused to go to HD yesterday due to pain surrounding her AVG, improved today.  K 5.1.  3. BP/ volume- BP variable with hypotension on HD. No meds here.Does not appear grossly volume overloaded on exam. Net UF goal 2L today.  Get standing weights for accuracy if able.  4. Anemiaof ESRD/ABLA - Hgb6.8 on admit.now improved to 9.4s/p 1U PRBCs on 5/6. Continue IV darbe 150 ug qwk, last 5/13. 5. MBD - Corr Ca/Phos ok. Continue binder, VDRA/sensipar q HD 6. DM2 - per pmd 7. Prot C deficiency/Hx DVT/PE -- On AC. Heparin/warfarin per pharmacy, awaiting therapeutic INR2-3, 2.4 today 8. Dispo - to SNF on d/c   Jen Mow, PA-C [place] 11/04/2020,10:20 AM  LOS: 12 days

## 2020-11-04 NOTE — Progress Notes (Signed)
PT Cancellation Note  Patient Details Name: Ashlee Miles MRN: [id number] DOB: 06/04/1941   Cancelled Treatment:    Reason Eval/Treat Not Completed: (P) Patient at procedure or test/unavailable (pt at HD dept.) Will continue efforts in afternoon per PT POC as schedule permits.   Ashlee Miles 11/04/2020, 11:12 AM

## 2020-11-04 NOTE — Progress Notes (Signed)
Subjective:  HD12  Patient seen in dialysis this morning. States she's feeling okay, no current complaints. Says pain has improved from yesterday. Discussed plan for hopeful discharge tomorrow.   Objective:  Vital signs in last 24 hours: Vitals:   11/03/20 1746 11/03/20 2015 11/04/20 0026 11/04/20 0458  BP: (!) 142/56  (!) 141/50 (!) 112/45  Pulse: 69 69 66 60  Resp: '20 16 16 16  '$ Temp: (!) 97.4 F (36.3 C) 98 F (36.7 C) 98 F (36.7 C) 98.2 F (36.8 C)  TempSrc: Oral Oral Oral Oral  SpO2: 99% 98% 98% 98%  Weight:      Height:       Physical Exam: General: Laying comfortably in bed, no acute distress CV: Regular rate, rhythm. No m/r/g appreciated. Pulm: Normal work of breathing on room air. No use accessory muscles Neuro: Awake, alert, answering questions appropriately. Psych: Normal mood, speech.  CBC Latest Ref Rng & Units 11/04/2020 11/03/2020 11/02/2020  WBC 4.0 - 10.5 K/uL 6.4 5.8 6.6  Hemoglobin 12.0 - 15.0 g/dL 9.4(L) 9.2(L) 9.8(L)  Hematocrit 36.0 - 46.0 % 28.8(L) 28.6(L) 30.6(L)  Platelets 150 - 400 K/uL 203 186 189   BMP Latest Ref Rng & Units 11/04/2020 11/03/2020 11/02/2020  Glucose 70 - 99 mg/dL 100(H) 106(H) 152(H)  BUN 8 - 23 mg/dL 34(H) 28(H) 22  Creatinine 0.44 - 1.00 mg/dL 9.20(H) 8.33(H) 6.56(H)  Sodium 135 - 145 mmol/L 130(L) 132(L) 132(L)  Potassium 3.5 - 5.1 mmol/L 5.1 4.6 4.5  Chloride 98 - 111 mmol/L 96(L) 97(L) 98  CO2 22 - 32 mmol/L '27 27 26  '$ Calcium 8.9 - 10.3 mg/dL 8.9 8.4(L) 8.7(L)   Assessment/Plan: Marinelle Hodgman is 80yo person living with ESRD on MWF HD, HFpEF, T2DM, protein C deficiency with prior DVT on Coumadin at home admitted 5/5 with AV graft ulceration, now POD10 s/p revision, now therapeutic INR, pending SNF tomorrow.   Principal Problem:   Complication of vascular access for dialysis Active Problems:   Essential hypertension   Chronic diastolic CHF (congestive heart failure) (HCC)   ESRD on dialysis ([place])   Anemia due to  chronic kidney disease   Epigastric pain   Diabetes mellitus with end stage renal disease (HCC)   Protein C deficiency (HCC)   Abdominal pain  #AV graft ulceration, POD10 s/p revision #ESRD on MWF HD Patient refused dialysis yesterday given extreme left leg thigh pain. This morning she was in dialysis, tolerating well. Appreciate nephrology's assistance. Per CSW insurance has authorized SNF bed, plan to discharge tomorrow. Appears euvolemic, continues to be hemodynamically stable. - HD today - Lidocaine cream 55mn - 1hr prior to HD - Medial access on HD until 6/20 - Daily BMP, CBC  #Therapeutic INR #Protein C deficiency with prior DVT INR 2.4 this AM, back at goal, heparin level also at goal this morning. Thankfully will be able to stop heparin drip this morning, continue on warfarin. Appreciate pharmacy assistance with dosing. Will touch base with them regarding home dose starting tomorrow. - D/ct heparin gtt - Warfarin per pharmacy - Daily INR, CBC  #Chronic opioid use disorder Patient doing much better this morning, able to tolerate dialysis. Will continue with current regimen. Expect to re-start home regimen upon discharge. - PO dilaudid '2mg'$  q8h - Fentanyl patch 12 mcg/hr - Hold & Call MD if SBP<90, HR<65, RR<10, O2<90, or altered mental status.  #Deconditioning Patient will need to continue working with physical therapy. Plan to discharge to SNF tomorrow. - PT/OT -  SNF tomorrow  #Anemia of chronic disease Hgb stable, no further bleeding events. Received Aranesp last Friday. - Daily CBC  DIET: CM IVF: n/a DVT PPX: warfarin BOWEL: n/a CODE: DNR/DNI FAM COM: Will call her daughter today vs tomorrow  Prior to Admission Living Arrangement: Home Anticipated Discharge Location: SNF Barriers to Discharge: SNF placement Dispo: Anticipated discharge in approximately 1 day(s).   [name], MD 11/04/2020, 6:30 AM Pager: [phone] After 5pm on weekdays and 1pm on  weekends: On Call pager [phone]

## 2020-11-04 NOTE — TOC Progression Note (Signed)
Transition of Care [facility]) - Progression Note    Patient Details  Name: Ashlee Miles MRN: [id number] Date of Birth: [birth year]  Transition of Care [facility]) CM/SW [place],  Phone Number: 11/04/2020, 12:58 PM  Clinical Narrative:     Received Josem Kaufmann 05/18-05/20 reference # [id number] Informed GHC/SNF anticipate d/c tomorrow  Will provide transportation details to [facility] once arranged by Renal Navigator Colleen.   Thurmond Butts, MSW, LCSW Clinical Social Worker   Expected Discharge Plan: Skilled Nursing Facility Barriers to Discharge: Continued Medical Work up  Expected Discharge Plan and Services Expected Discharge Plan: [place] Choice: [place] arrangements for the past 2 months: Single Family Home                                       Social Determinants of Health (SDOH) Interventions    Readmission Risk Interventions Readmission Risk Prevention Plan 03/25/2020  Transportation Screening Complete  Palliative Care Screening Not Applicable  Skilled Nursing Facility Complete  Some recent data might be hidden

## 2020-11-05 LAB — RENAL FUNCTION PANEL
Albumin: 2.4 g/dL — ABNORMAL LOW (ref 3.5–5.0)
Anion gap: 6 (ref 5–15)
BUN: 18 mg/dL (ref 8–23)
CO2: 29 mmol/L (ref 22–32)
Calcium: 8.6 mg/dL — ABNORMAL LOW (ref 8.9–10.3)
Chloride: 99 mmol/L (ref 98–111)
Creatinine, Ser: 6.22 mg/dL — ABNORMAL HIGH (ref 0.44–1.00)
GFR, Estimated: 6 mL/min — ABNORMAL LOW (ref >60)
Glucose, Bld: 110 mg/dL — ABNORMAL HIGH (ref 70–99)
Phosphorus: 3.6 mg/dL (ref 2.5–4.6)
Potassium: 4.8 mmol/L (ref 3.5–5.1)
Sodium: 134 mmol/L — ABNORMAL LOW (ref 135–145)

## 2020-11-05 LAB — GLUCOSE, CAPILLARY
Glucose-Capillary: 118 mg/dL — ABNORMAL HIGH (ref 70–99)
Glucose-Capillary: 172 mg/dL — ABNORMAL HIGH (ref 70–99)

## 2020-11-05 LAB — CBC
HCT: 29.5 % — ABNORMAL LOW (ref 36.0–46.0)
Hemoglobin: 9.5 g/dL — ABNORMAL LOW (ref 12.0–15.0)
MCH: 27 pg (ref 26.0–34.0)
MCHC: 32.2 g/dL (ref 30.0–36.0)
MCV: 83.8 fL (ref 80.0–100.0)
Platelets: 192 10*3/uL (ref 150–400)
RBC: 3.52 MIL/uL — ABNORMAL LOW (ref 3.87–5.11)
RDW: 17 % — ABNORMAL HIGH (ref 11.5–15.5)
WBC: 6.3 10*3/uL (ref 4.0–10.5)
nRBC: 0 % (ref 0.0–0.2)

## 2020-11-05 LAB — PROTIME-INR
INR: 2.7 — ABNORMAL HIGH (ref 0.8–1.2)
Prothrombin Time: 29 seconds — ABNORMAL HIGH (ref 11.4–15.2)

## 2020-11-05 MED ORDER — WARFARIN SODIUM 5 MG PO TABS
5.0000 mg | ORAL_TABLET | Freq: Every day | ORAL | Status: DC
  Administered 2020-11-05: 5 mg via ORAL
  Filled 2020-11-05: qty 1

## 2020-11-05 MED ORDER — HEPARIN SOD (PORK) LOCK FLUSH 100 UNIT/ML IV SOLN
500.0000 [IU] | INTRAVENOUS | Status: AC | PRN
  Administered 2020-11-05: 500 [IU]
  Filled 2020-11-05: qty 5

## 2020-11-05 MED ORDER — OXYCODONE HCL 10 MG PO TABS: 10.0000 mg | ORAL_TABLET | Freq: Four times a day (QID) | ORAL | 0 refills | Status: AC | PRN

## 2020-11-05 MED ORDER — CHLORHEXIDINE GLUCONATE CLOTH 2 % EX PADS: 6.0000 | MEDICATED_PAD | Freq: Every day | CUTANEOUS | Status: DC

## 2020-11-05 MED ORDER — WARFARIN SODIUM 7.5 MG PO TABS: 7.5000 mg | ORAL_TABLET | Freq: Every day | ORAL | Status: DC

## 2020-11-05 NOTE — Progress Notes (Signed)
Physical Therapy Treatment Patient Details Name: Ashlee Miles MRN: [id number] DOB: [birth year] Today's Date: 11/05/2020    History of Present Illness Ashlee Miles is 80yo person presenting for evaluation of fistula due to profuse bleeding from her left thigh fistula. Per chart review, she was evaluated in the ED on 5/1 for this issue. Pt missing a few HD sessions. Denies fevers, chills, chest pain, dyspnea, cough, nausea, vomiting, abdominal pain, constipation, dysuria; an episode of diarrhea last week. PMH: ESRD on MWF HD, HFpEF, T2DM, protein C deficiency, hx DVT/PE on Coumadin    PT Comments    Pt received in supine, agreeable to therapy session with encouragement and with good participation and tolerance for transfer and gait training. Pt making good progress toward mobility goals, able to ambulate short household distances with use of RW and minA but needs chair follow for safety. Pt needs up to [facility] for transfer safety and verbal/tactile cues for hand placement with transfers. Pt continues to benefit from PT services to progress toward functional mobility goals. Continue to recommend SNF.  Follow Up Recommendations  SNF;Supervision/Assistance - 24 hour     Equipment Recommendations  None recommended by PT;Other (comment) (TBA next venue)    Recommendations for Other Services       Precautions / Restrictions Precautions Precautions: Fall Restrictions Weight Bearing Restrictions: No    Mobility  Bed Mobility Overal bed mobility: Needs Assistance Bed Mobility: Supine to Sit     Supine to sit: Min assist;HOB elevated;+2 for safety/equipment     General bed mobility comments: minA to elevate trunk with heavy instruction for technique. Pt able to advance hips while seated EOB with minA to min guard assist. Required increased time and effort to perform and cues throughout for self-assist.    Transfers Overall transfer level: Needs assistance Equipment used: Rolling walker (2  wheeled) Transfers: Sit to/from Stand Sit to Stand: Min assist;+2 physical assistance;Min guard  ModA at times for stand>sit when fatigued       General transfer comment: cues for hand placement and minA for steadying upon standing at RW due to posterior bias; improved initiation this session and does not always need boost assist; from EOB x2 and chair x2 reps  Ambulation/Gait Ambulation/Gait assistance: Min assist;+2 safety/equipment Gait Distance (Feet): 30 Feet (72f, seated break, 386f Assistive device: Rolling walker (2 wheeled) Gait Pattern/deviations: Shuffle;Step-through pattern;Decreased stride length;Drifts right/left (forward head/downward gaze)     General Gait Details: cues for posture needed, minA at most for stability; very slow, guarded steps, reliant on RW for support; chair follow for safety as pt quick to fatigue but no buckling; needs some assist to manage RW mostly during turns and tending to run into objects in tight spaces   Stairs             Wheelchair Mobility    Modified Rankin (Stroke Patients Only)       Balance Overall balance assessment: Needs assistance Sitting-balance support: No upper extremity supported;Feet supported Sitting balance-[name]: Fair Sitting balance - Comments: pt able to sit and weight shift at EOB no LOB   Standing balance support: Bilateral upper extremity supported;During functional activity Standing balance-[name]: Poor Standing balance comment: reliant on RW and external assist, pt verbalized fear of falls                            Cognition Arousal/Alertness: Awake/alert Behavior During Therapy: WFL for tasks assessed/performed Overall Cognitive Status: No  family/caregiver present to determine baseline cognitive functioning                                 General Comments: hx of cognitive impairments, pt following 1-step commands with increased time, cooperative and  pleasant.      Exercises General Exercises - Lower Extremity Ankle Circles/Pumps: AROM;Both;15 reps;Supine Long Arc Quad: AROM;5 reps;Both;Seated    General Comments General comments (skin integrity, edema, etc.): VSS on RA throughout; no dizziness with positional changes and BP not further assessed.      Pertinent Vitals/Pain Pain Assessment: Faces Faces Pain Scale: Hurts a little bit Pain Location: LLE Pain Descriptors / Indicators: Discomfort Pain Intervention(s): Monitored during session;Repositioned    Home Living                      Prior Function            PT Goals (current goals can now be found in the care plan section) Acute Rehab PT Goals Patient Stated Goal: get some rehab at SNF before coming home. PT Goal Formulation: With patient Time For Goal Achievement: 11/12/20 Potential to Achieve Goals: Good Progress towards PT goals: Progressing toward goals    Frequency    Min 3X/week      PT Plan Current plan remains appropriate    Co-evaluation              AM-PAC PT "6 Clicks" Mobility   Outcome Measure  Help needed turning from your back to your side while in a flat bed without using bedrails?: A Little Help needed moving from lying on your back to sitting on the side of a flat bed without using bedrails?: A Lot (increased assist for sit>supine less for supine>sit) Help needed moving to and from a bed to a chair (including a wheelchair)?: A Little Help needed standing up from a chair using your arms (e.g., wheelchair or bedside chair)?: A Little Help needed to walk in hospital room?: A Little (chair follow) Help needed climbing 3-5 steps with a railing? : Total 6 Click Score: 15    End of Session Equipment Utilized During Treatment: Gait belt Activity Tolerance: Patient tolerated treatment well Patient left: with call bell/phone within reach;in chair;with chair alarm set (B heels floated) Nurse Communication: Mobility status PT  Visit Diagnosis: Unsteadiness on feet (R26.81);Other abnormalities of gait and mobility (R26.89);Muscle weakness (generalized) (M62.81);Difficulty in walking, not elsewhere classified (R26.2);Pain Pain - Right/Left: Left Pain - part of body: Leg     Time: 1012-1040 PT Time Calculation (min) (ACUTE ONLY): 28 min  Charges:  $Gait Training: 8-22 mins $Therapeutic Activity: 8-22 mins                     Cassy Sprowl P., PTA Acute Rehabilitation Services Pager: [phone] Office: [place] 11/05/2020, 11:52 AM

## 2020-11-05 NOTE — Progress Notes (Signed)
Ashlee Miles Progress Note   Subjective:   Patient seen and examined at bedside.  Reports prolonged bleeding post HD yesterday.  Concerned about going home today with bleeding yesterday.  Nurse reported having to hold pressure for 15-52mn then applied Surgifoam dressing.  No other complaints.  Pain mostly well controlled.  Denies CP, SOB, n/v/d, abdominal pain and fatigue.   Objective Vitals:   11/04/20 2333 11/05/20 0358 11/05/20 0420 11/05/20 0744  BP: (!) 134/47 (!) 133/47  (!) 133/46  Pulse: 60 62  64  Resp: '13 16  16  '$ Temp: 98.4 F (36.9 C) 98.3 F (36.8 C)  98.2 F (36.8 C)  TempSrc: Oral Oral  Oral  SpO2: 97% 100%  99%  Weight:   91.5 kg   Height:       Physical Exam General:well appearing female in NAD Heart:RRR, no mrg Lungs:CTAB, nml WOB Abdomen:soft, NTND Extremities:no LE edema Dialysis Access: L thigh AVG with dressing in place, +b/t   Filed Weights   11/04/20 1100 11/04/20 1607 11/05/20 0420  Weight: 89.2 kg 91.4 kg 91.5 kg    Intake/Output Summary (Last 24 hours) at 11/05/2020 1007 Last data filed at 11/04/2020 2100 Gross per 24 hour  Intake 360 ml  Output 2000 ml  Net -1640 ml    Additional Objective Labs: Basic Metabolic Panel: Recent Labs  Lab 11/03/20 0600 11/04/20 0245 11/05/20 0530  NA 132* 130* 134*  K 4.6 5.1 4.8  CL 97* 96* 99  CO2 '27 27 29  '$ GLUCOSE 106* 100* 110*  BUN 28* 34* 18  CREATININE 8.33* 9.20* 6.22*  CALCIUM 8.4* 8.9 8.6*  PHOS 3.9 3.9 3.6   Liver Function Tests: Recent Labs  Lab 11/03/20 0600 11/04/20 0245 11/05/20 0530  ALBUMIN 2.4* 2.6* 2.4*   CBC: Recent Labs  Lab 11/01/20 0943 11/02/20 0235 11/03/20 0600 11/04/20 0245 11/05/20 0530  WBC 5.6 6.6 5.8 6.4 6.3  HGB 9.7* 9.8* 9.2* 9.4* 9.5*  HCT 29.4* 30.6* 28.6* 28.8* 29.5*  MCV 83.1 84.8 83.4 83.2 83.8  PLT 169 189 186 203 192   CBG: Recent Labs  Lab 11/04/20 0623 11/04/20 1219 11/04/20 1256 11/04/20 1612 11/04/20 2143  GLUCAP  90 68* 69* 182* 149*   Studies/Results: No results found.  Medications:  . calcitRIOL  1 mcg Oral Q M,W,F-HD  . Chlorhexidine Gluconate Cloth  6 each Topical Daily  . Chlorhexidine Gluconate Cloth  6 each Topical Q0600  . cinacalcet  30 mg Oral Q M,W,F-HD  . darbepoetin (ARANESP) injection - DIALYSIS  150 mcg Intravenous Q Fri-HD  . famotidine  20 mg Oral Daily  . fentaNYL  1 patch Transdermal Q72H  . gabapentin  100 mg Oral QHS  . HYDROmorphone  2 mg Oral Q8H  . lidocaine-prilocaine   Topical QODAY  . pantoprazole  40 mg Oral BID AC  . sertraline  50 mg Oral Daily  . sevelamer carbonate  800 mg Oral TID WC  . simvastatin  40 mg Oral q1800  . sodium chloride flush  10-40 mL Intracatheter Q12H  . sodium chloride flush  3 mL Intravenous Q12H  . Warfarin - Pharmacist Dosing Inpatient   Does not apply [id number]   Dialysis Orders: MWF NW 4h 4/00/500 90.5kg 3K/2.5 bath Hep none L thigh AVG - mircera 100 q2 last 4/29, due 5/13 - calcitriol 1 ug po tiw  Assessment/Plan: 1. AV graft eschar -recent significant bleeding event at home.s/p revision of left thigh AVG w/interposition grafton 5/8 byDr.  Clark. See VVS instructions page (up in HD unit) for where to stick the AVG (medially)for next 4- 6 wks (thru June 6-20) while incision heals.Pain control per primary.Prolonged bleeding post HD - plan to keep patient until at least tomorrow to see if bleeds again.  If so may need to re consult vascular to take a look at it.  2. ESRD - on HD MWF.HD tomorrow per regular schedule.  3. BP/ volume- BP variable with hypotension on HD. No meds here.Does not appear grossly volume overloaded on exam. Get standing weights for accuracyif able.  4. Anemiaof ESRD/ABLA - Hgb6.8 on admit.now improved to 9.5 s/p 1U PRBCs on 5/6. Continue IV darbe 150 ug qwk, last 5/13. 5. MBD - Corr Ca/Phos ok. Continue binder, VDRA/sensipar q HD 6. DM2 - per pmd 7. Prot C deficiency/Hx DVT/PE -- On AC.  Heparin/warfarin per pharmacy, awaiting therapeutic INR2-3, 2.7 today 8. Dispo -hold on d/c to see if patient has prolonged bleeding from AVG again tomorrow.    Jen Mow, PA-C [facility] 11/05/2020,10:07 AM  LOS: 13 days

## 2020-11-05 NOTE — TOC Transition Note (Signed)
Transition of Care [facility]) - CM/SW Discharge Note   Patient Details  Name: Ashlee Miles MRN: [id number] Date of Birth: 14-Dec-1940  [facility]) CM/SW Contact:  Vinie Sill, LCSW Phone Number: 11/05/2020, 2:31 PM   Clinical Narrative:     Patient will Discharge to: [facility] Discharge Date:11/05/2020 Family Notified:Angela,daughter Transport [id number]  Per MD patient is ready for discharge. RN, patient, and facility notified of discharge. Discharge Summary sent to facility. RN given number for [phone],Room 101. Ambulance transport requested for patient.   Clinical Social Worker signing off.  Thurmond Butts, MSW, LCSW Clinical Social Worker    Final next level of care: Skilled Nursing Facility Barriers to Discharge: Barriers Resolved   Patient Goals and CMS Choice   CMS Medicare.gov Compare Post Acute Care list provided to:: Patient Represenative (must comment) (daughter [name]) Choice offered to / list presented to : Adult Children  Discharge Placement              Patient chooses bed at: [facility] Patient to be transferred to facility by: [place] Name of family member notified: daughter Patient and family notified of of transfer: 11/05/20  Discharge Plan and Services     Post Acute Care Choice: [place]                               Social Determinants of Health (SDOH) Interventions     Readmission Risk Interventions Readmission Risk Prevention Plan 03/25/2020  Transportation Screening Complete  Palliative Care Screening Not Applicable  Skilled Nursing Facility Complete  Some recent data might be hidden

## 2020-11-05 NOTE — Progress Notes (Addendum)
ANTICOAGULATION CONSULT NOTE - Follow Up Consult  Pharmacy Consult for Heparin >> Warfarin Indication: hx DVT/PE, Protein C deficiency  No Known Allergies  Patient Measurements: Height: '5\' 4"'$  (162.6 cm) Weight: 91.5 kg (201 lb 11.5 oz) IBW/kg (Calculated) : 54.7 Heparin Dosing Weight: 80 kg  Vital Signs: Temp: 97.8 F (36.6 C) (05/19 1102) Temp Source: Oral (05/19 1102) BP: 135/42 (05/19 1102) Pulse Rate: 70 (05/19 1102)  Labs: Recent Labs    11/03/20 0600 11/04/20 0245 11/05/20 0530  HGB 9.2* 9.4* 9.5*  HCT 28.6* 28.8* 29.5*  PLT 186 203 192  LABPROT 21.8* 26.3* 29.0*  INR 1.9* 2.4* 2.7*  HEPARINUNFRC 0.50 0.47  --   CREATININE 8.33* 9.20* 6.22*   ESRD  Assessment: 80 y.o. female on warfarin PTA for hx PE/DVT and protein C deficiency. Warfarin held for procedure, resumed 5/9. On IV heparin bridge.   Heparin stopped and warfarin held 5/10 afternoon after nursing reported bleeding from left thigh graft. Per vascular bleeding from cannulation site and no bleeding from surgical site revision and bleeding has resolved. Heparin and warfarin restarted 5/11 pm.   Heparin stopped with INR > 2 INR 2.7 at goal Hd big jumps after starting PTA dose of warafrin '10mg'$  daily x3 doses cbc stable and no bleeding today but does have lots of bleeding when AVG accessed for HD  - will try to keep INR closer to 2 Concern with continuing warfarin '10mg'$  - PTA Warfarin dose:  Outpatient pharmacy reports last filled (LF) dates for warfarin as follows: Warfarin 2 mg LF date 08/19/20 and 5 mg LF date 08/06/20, previously attempted to confirm home medication /dosage with patient's family member.  On 5/15, reported 10 mg daily except 5 mg on Sunday and Thursday. .  Goal of Therapy:  INR 2-3 Heparin level 0.3-0.7 units/ml Monitor platelets by anticoagulation protocol: Yes   Plan:  Warfarin '5mg'$  x1 today Maybe warfarin 7.'5mg'$  daily start tomorrow Daily heparin level, PT/INR and CBC.  Bonnita Nasuti  Pharm.D. CPP, BCPS Clinical Pharmacist [phone] 11/05/2020 12:41 PM

## 2020-11-05 NOTE — Progress Notes (Signed)
Mobility Specialist: Progress Note   11/05/20 1442  Mobility  Activity Ambulated in hall  Level of Assistance Minimal assist, patient does 75% or more  Assistive Device Front wheel walker  Distance Ambulated (ft) 56 ft (32' then 24')  Mobility Ambulated with assistance in hallway  Mobility Response Tolerated well  Mobility performed by Mobility specialist  Bed Position Chair  $Mobility charge 1 Mobility   Pre-Mobility: 70 HR Post-Mobility: 73 HR  Pt c/o soreness in BLE during ambulation. Pt able to ambulate 32' before taking a seated break. Pt rested for 1-2 minutes and then returned to the recliner after walk.  [facility] Kunio Cummiskey Mobility Specialist Mobility Specialist Phone: [phone]

## 2020-11-05 NOTE — Progress Notes (Signed)
Occupational Therapy Treatment Patient Details Name: Ashlee Miles MRN: [id number] DOB: Dec 04, 1940 Today's Date: 11/05/2020    History of present illness Ashlee Miles is 80yo person presenting for evaluation of fistula due to profuse bleeding from her left thigh fistula. Per chart review, she was evaluated in the ED on 5/1 for this issue. Pt missing a few HD sessions. Denies fevers, chills, chest pain, dyspnea, cough, nausea, vomiting, abdominal pain, constipation, dysuria; an episode of diarrhea last week. PMH: ESRD on MWF HD, HFpEF, T2DM, protein C deficiency, hx DVT/PE on Coumadin   OT comments  Pt making good progress with functional goals. Pt in recliner upon arrival, session focused on ADL mobility with RW, toilet transfers, toileting tasks, grooming/hygiene standing and ADLs seated in recliner. Pt hopeful to d/c to SNF todat for ST rehab.   Follow Up Recommendations  SNF    Equipment Recommendations  Other (comment) (TBD at SNF)    Recommendations for Other Services      Precautions / Restrictions Precautions Precautions: Fall Restrictions Weight Bearing Restrictions: No       Mobility Bed Mobility Overal bed mobility: Needs Assistance Bed Mobility: Supine to Sit     Supine to sit: Min assist;HOB elevated;+2 for safety/equipment     General bed mobility comments: pt in recliner upon arrival    Transfers Overall transfer level: Needs assistance Equipment used: Rolling walker (2 wheeled) Transfers: Sit to/from Stand Sit to Stand: Min assist Stand pivot transfers: Min guard       General transfer comment: cues for hand placement and for slow/safe stand - sit transiton to toilet and back to recliner    Balance Overall balance assessment: Needs assistance Sitting-balance support: No upper extremity supported;Feet supported Sitting balance-[name]: Fair Sitting balance - Comments: pt able to sit and weight shift at EOB no LOB   Standing balance support:  Bilateral upper extremity supported;During functional activity Standing balance-[name]: Poor Standing balance comment: reliant on RW and external assist, pt verbalized fear of falls                           ADL either performed or assessed with clinical judgement   ADL Overall ADL's : Needs assistance/impaired     Grooming: Wash/dry hands;Wash/dry face;Min guard;Standing   Upper Body Bathing: Set up;Supervision/ safety;Sitting Upper Body Bathing Details (indicate cue type and reason): simulated seated in recliner     Upper Body Dressing : Set up;Supervision/safety;Sitting       Toilet Transfer: Ambulation;RW;BSC;Cueing for safety;Minimal assistance;Min guard   Toileting- Clothing Manipulation and Hygiene: Minimal assistance;Sit to/from stand       Functional mobility during ADLs: Cueing for safety;Rolling walker;Minimal assistance;Min guard       Vision Baseline Vision/History: Wears glasses Wears Glasses: At all times Patient Visual Report: No change from baseline     Perception     Praxis      Cognition Arousal/Alertness: Awake/alert Behavior During Therapy: WFL for tasks assessed/performed Overall Cognitive Status: No family/caregiver present to determine baseline cognitive functioning                                 General Comments: hx of cognitive impairments, pt following 1-step commands with increased time, cooperative and pleasant.        Exercises Exercises: General Lower Extremity General Exercises - Lower Extremity Ankle Circles/Pumps: AROM;Both;15 reps;Supine Long Arc Quad: AROM;5 reps;Both;Seated  Shoulder Instructions       General Comments VSS on RA throughout; no dizziness with positional changes and BP not further assessed.    Pertinent Vitals/ Pain       Pain Assessment: Faces Faces Pain Scale: Hurts a little bit Pain Location: LLE Pain Descriptors / Indicators: Discomfort Pain Intervention(s):  Monitored during session;Repositioned  Home Living                                          Prior Functioning/Environment              Frequency  Min 2X/week        Progress Toward Goals  OT Goals(current goals can now be found in the care plan section)  Progress towards OT goals: Progressing toward goals  Acute Rehab OT Goals Patient Stated Goal: go to rehab and go home  Plan Discharge plan remains appropriate    Co-evaluation                 AM-PAC OT "6 Clicks" Daily Activity     Outcome Measure   Help from another person eating meals?: None Help from another person taking care of personal grooming?: A Little Help from another person toileting, which includes using toliet, bedpan, or urinal?: A Little Help from another person bathing (including washing, rinsing, drying)?: A Lot Help from another person to put on and taking off regular upper body clothing?: None Help from another person to put on and taking off regular lower body clothing?: A Lot 6 Click Score: 18    End of Session Equipment Utilized During Treatment: Gait belt;Rolling walker;Other (comment) (3 in 1 over toilet)  OT Visit Diagnosis: Other abnormalities of gait and mobility (R26.89);Unsteadiness on feet (R26.81);Muscle weakness (generalized) (M62.81);Other symptoms and signs involving cognitive function   Activity Tolerance Patient tolerated treatment well   Patient Left with call bell/phone within reach;in chair;with chair alarm set   Nurse Communication          Time: [phone] OT Time Calculation (min): 23 min  Charges: OT General Charges $OT Visit: 1 Visit OT Treatments $Self Care/Home Management : 8-22 mins $Therapeutic Activity: 8-22 mins    Britt Bottom 11/05/2020, 2:20 PM

## 2020-11-05 NOTE — Progress Notes (Addendum)
  Progress Note    11/05/2020 12:55 PM 11 Days Post-Op  Subjective:  No complaints. Patient reports no bleeding issues or complications at dialysis yesterday   Vitals:   11/05/20 0744 11/05/20 1102  BP: (!) 133/46 (!) 135/42  Pulse: 64 70  Resp: 16 16  Temp: 98.2 F (36.8 C) 97.8 F (36.6 C)  SpO2: 99% 99%   Physical Exam: Cardiac: regular Lungs: non labored Incisions:  Left thigh AV graft with some ulceration along lateral aspect where graft has been revised. Incisions look good and are healing well. Bandages from dialysis are c/d/i   Neurologic: alert and oriented  CBC    Component Value Date/Time   WBC 6.3 11/05/2020 0530   RBC 3.52 (L) 11/05/2020 0530   HGB 9.5 (L) 11/05/2020 0530   HCT 29.5 (L) 11/05/2020 0530   PLT 192 11/05/2020 0530   MCV 83.8 11/05/2020 0530   MCH 27.0 11/05/2020 0530   MCHC 32.2 11/05/2020 0530   RDW 17.0 (H) 11/05/2020 0530   LYMPHSABS 1.5 10/22/2020 1259   MONOABS 0.6 10/22/2020 1259   EOSABS 0.3 10/22/2020 1259   BASOSABS 0.0 10/22/2020 1259    BMET    Component Value Date/Time   NA 134 (L) 11/05/2020 0530   K 4.8 11/05/2020 0530   CL 99 11/05/2020 0530   CO2 29 11/05/2020 0530   GLUCOSE 110 (H) 11/05/2020 0530   BUN 18 11/05/2020 0530   CREATININE 6.22 (H) 11/05/2020 0530   CALCIUM 8.6 (L) 11/05/2020 0530   CALCIUM 7.8 (L) 03/13/2017 1559   GFRNONAA 6 (L) 11/05/2020 0530   GFRAA 6 (L) 03/24/2020 0500    INR    Component Value Date/Time   INR 2.7 (H) 11/05/2020 0530     Intake/Output Summary (Last 24 hours) at 11/05/2020 1255 Last data filed at 11/05/2020 0800 Gross per 24 hour  Intake 480 ml  Output --  Net 480 ml     Assessment/Plan:  80 y.o. female is s/p left thigh AVG revision with interposition graftby Dr. Carlis Abbott secondary to overlying ulceration with bleeding 11 Days Post-Op. She had been doing well since last week with no issues with graft. Per Nephrology note she had some bleeding after HD yesterday  requiring prolonged pressure hemostasis and use of Surgifoam. Per patient she does not recall any bleeding issues. She has no active bleeding. Heparin was stopped with INR in therapeutic range. On Coumadin currently with INR of 2.7 today. Pharmacy to dose warfarin at lower dose to keep INR closer to 2. Recommend seeing how she does with HD tomorrow. She may require fistulogram if continued bleeding issues but likely this is just due to her INR being therapeutic now. Vascular will be available if continued issues  DVT prophylaxis:  Coumadin   Karoline Caldwell, PA-C [facility] [phone] 11/05/2020 12:55 PM   Addendum: Discussed with Primary team and she is okay for discharge from vascular standpoint. If she has ongoing issues with bleeding at outpatient HD she can follow up with [place] as an outpatient. I suspect that her bleeding issues are secondary to her INR now being therapeutic. She is not having any issues with spontaneous bleeding

## 2020-11-12 ENCOUNTER — Encounter (HOSPITAL_COMMUNITY): Payer: 59

## 2020-11-19 ENCOUNTER — Encounter (HOSPITAL_COMMUNITY): Payer: 59

## 2020-11-24 ENCOUNTER — Encounter: Payer: Self-pay | Admitting: Physician Assistant

## 2020-11-24 ENCOUNTER — Other Ambulatory Visit: Payer: Self-pay

## 2020-11-24 ENCOUNTER — Ambulatory Visit (INDEPENDENT_AMBULATORY_CARE_PROVIDER_SITE_OTHER): Payer: 59 | Admitting: Physician Assistant

## 2020-11-24 VITALS — BP 179/65 | HR 70 | Temp 98.5°F | Resp 20 | Ht 64.0 in | Wt 200.0 lb

## 2020-11-24 DIAGNOSIS — J42 Unspecified chronic bronchitis: Secondary | ICD-10-CM | POA: Insufficient documentation

## 2020-11-24 DIAGNOSIS — Z7901 Long term (current) use of anticoagulants: Secondary | ICD-10-CM | POA: Insufficient documentation

## 2020-11-24 DIAGNOSIS — M25432 Effusion, left wrist: Secondary | ICD-10-CM | POA: Insufficient documentation

## 2020-11-24 DIAGNOSIS — G629 Polyneuropathy, unspecified: Secondary | ICD-10-CM | POA: Insufficient documentation

## 2020-11-24 DIAGNOSIS — Z86718 Personal history of other venous thrombosis and embolism: Secondary | ICD-10-CM | POA: Insufficient documentation

## 2020-11-24 DIAGNOSIS — N186 End stage renal disease: Secondary | ICD-10-CM

## 2020-11-24 DIAGNOSIS — L299 Pruritus, unspecified: Secondary | ICD-10-CM | POA: Insufficient documentation

## 2020-11-24 DIAGNOSIS — G894 Chronic pain syndrome: Secondary | ICD-10-CM | POA: Insufficient documentation

## 2020-11-24 DIAGNOSIS — L97409 Non-pressure chronic ulcer of unspecified heel and midfoot with unspecified severity: Secondary | ICD-10-CM | POA: Insufficient documentation

## 2020-11-24 DIAGNOSIS — Z992 Dependence on renal dialysis: Secondary | ICD-10-CM

## 2020-11-24 DIAGNOSIS — Z4889 Encounter for other specified surgical aftercare: Secondary | ICD-10-CM | POA: Insufficient documentation

## 2020-11-24 NOTE — Progress Notes (Signed)
    Postoperative Access Visit   History of Present Illness   Ashlee Miles is a 80 y.o. year old female who presents for postoperative follow-up for:  1. Thrombectomy of left thigh AV graft 2.  Revision of left thigh AV graft with 6 mm Gore-Tex interposition bypass around ulcerated segment of graft by Dr. Carlis Abbott on 10/25/20. The patient's wounds are well healed. The patient notes no steal symptoms. They are using her graft for dialysis without any issues with its function however she has had continuous prolonged bleeding requiring pressure hemostasis followed by compressive dressings. She is on Coumadin and was having issues during her hospitalization with bleeding secondary to supratherapeautic INR.   Physical Examination   Vitals:   11/24/20 0955  BP: (!) 179/65  Pulse: 70  Resp: 20  Temp: 98.5 F (36.9 C)  TempSrc: Temporal  SpO2: 100%  Weight: 200 lb (90.7 kg)  Height: '5\' 4"'$  (1.626 m)   Body mass index is 34.33 kg/m.  left arm Incision is well healed, no bleeding, palpable thrill, bruit can be auscultated     Medical Decision Making   Ashlee Miles is a 80 y.o. year old female who presents s/p 1. Thrombectomy of left thigh AV graft 2.  Revision of left thigh AV graft with 6 mm Gore-Tex interposition bypass around ulcerated segment of graft by Dr. Carlis Abbott on 10/25/20. Left thigh incisions well healed. Patent is without signs or symptoms of steal syndrome. She has been having prolonged bleeding after dialysis. While in hospital we suspected that this was due to transitioning from Heparin to coumadin and having supratherapeautic INR levels. However with continued bleeding issues I recommend she have a shuntogram of left thigh graft to see if any stenosis causing the bleeding. She is on coumadin. She dialyzes on MWF so will try to arranged this around her dialysis schedule   Karoline Caldwell, PA-C [facility] [place] Office: [facility] MD: Carlis Abbott /  Stanford Breed

## 2020-12-01 ENCOUNTER — Encounter (HOSPITAL_COMMUNITY): Payer: 59

## 2020-12-03 ENCOUNTER — Encounter (HOSPITAL_COMMUNITY): Payer: Self-pay | Admitting: Vascular Surgery

## 2020-12-03 ENCOUNTER — Ambulatory Visit (HOSPITAL_COMMUNITY)
Admission: RE | Admit: 2020-12-03 | Discharge: 2020-12-03 | Disposition: A | Discharge status: Home / Self Care | Payer: 59 | Attending: Vascular Surgery | Admitting: Vascular Surgery

## 2020-12-03 ENCOUNTER — Encounter (HOSPITAL_COMMUNITY)
Admission: RE | Disposition: A | Discharge status: Home / Self Care | Payer: Self-pay | Source: Home / Self Care | Attending: Vascular Surgery

## 2020-12-03 ENCOUNTER — Other Ambulatory Visit: Payer: Self-pay

## 2020-12-03 DIAGNOSIS — T82838A Hemorrhage of vascular prosthetic devices, implants and grafts, initial encounter: Secondary | ICD-10-CM | POA: Diagnosis not present

## 2020-12-03 DIAGNOSIS — Z7901 Long term (current) use of anticoagulants: Secondary | ICD-10-CM | POA: Insufficient documentation

## 2020-12-03 DIAGNOSIS — T82510A Breakdown (mechanical) of surgically created arteriovenous fistula, initial encounter: Secondary | ICD-10-CM | POA: Diagnosis present

## 2020-12-03 DIAGNOSIS — Z992 Dependence on renal dialysis: Secondary | ICD-10-CM | POA: Diagnosis not present

## 2020-12-03 DIAGNOSIS — Y832 Surgical operation with anastomosis, bypass or graft as the cause of abnormal reaction of the patient, or of later complication, without mention of misadventure at the time of the procedure: Secondary | ICD-10-CM | POA: Diagnosis not present

## 2020-12-03 DIAGNOSIS — Y841 Kidney dialysis as the cause of abnormal reaction of the patient, or of later complication, without mention of misadventure at the time of the procedure: Secondary | ICD-10-CM | POA: Insufficient documentation

## 2020-12-03 HISTORY — PX: A/V SHUNTOGRAM: CATH118297

## 2020-12-03 LAB — PROTIME-INR
INR: 3.9 — ABNORMAL HIGH (ref 0.8–1.2)
Prothrombin Time: 38.4 seconds — ABNORMAL HIGH (ref 11.4–15.2)

## 2020-12-03 LAB — POCT I-STAT, CHEM 8
BUN: 19 mg/dL (ref 8–23)
Calcium, Ion: 0.77 mmol/L — CL (ref 1.15–1.40)
Chloride: 98 mmol/L (ref 98–111)
Creatinine, Ser: 5.5 mg/dL — ABNORMAL HIGH (ref 0.44–1.00)
Glucose, Bld: 79 mg/dL (ref 70–99)
HCT: 36 % (ref 36.0–46.0)
Hemoglobin: 12.2 g/dL (ref 12.0–15.0)
Potassium: 4.7 mmol/L (ref 3.5–5.1)
Sodium: 135 mmol/L (ref 135–145)
TCO2: 30 mmol/L (ref 22–32)

## 2020-12-03 LAB — GLUCOSE, CAPILLARY
Glucose-Capillary: 78 mg/dL (ref 70–99)
Glucose-Capillary: 69 mg/dL — ABNORMAL LOW (ref 70–99)
Glucose-Capillary: 85 mg/dL (ref 70–99)

## 2020-12-03 SURGERY — A/V SHUNTOGRAM
Anesthesia: LOCAL | Laterality: Left

## 2020-12-03 MED ORDER — SODIUM CHLORIDE 0.9% FLUSH: 3.0000 mL | Freq: Two times a day (BID) | INTRAVENOUS | Status: DC

## 2020-12-03 MED ORDER — HEPARIN (PORCINE) IN NACL 1000-0.9 UT/500ML-% IV SOLN
INTRAVENOUS | Status: DC | PRN
  Administered 2020-12-03: 500 mL

## 2020-12-03 MED ORDER — ACETAMINOPHEN 325 MG PO TABS
ORAL_TABLET | ORAL | Status: AC
  Filled 2020-12-03: qty 2

## 2020-12-03 MED ORDER — HEPARIN (PORCINE) IN NACL 1000-0.9 UT/500ML-% IV SOLN
INTRAVENOUS | Status: AC
  Filled 2020-12-03: qty 1000

## 2020-12-03 MED ORDER — SODIUM CHLORIDE 0.9 % IV SOLN: 250.0000 mL | INTRAVENOUS | Status: DC | PRN

## 2020-12-03 MED ORDER — IODIXANOL 320 MG/ML IV SOLN
INTRAVENOUS | Status: DC | PRN
  Administered 2020-12-03: 30 mL

## 2020-12-03 MED ORDER — SODIUM CHLORIDE 0.9% FLUSH: 3.0000 mL | INTRAVENOUS | Status: DC | PRN

## 2020-12-03 MED ORDER — ACETAMINOPHEN 325 MG PO TABS
650.0000 mg | ORAL_TABLET | Freq: Once | ORAL | Status: AC
  Administered 2020-12-03: 650 mg via ORAL
  Filled 2020-12-03: qty 2

## 2020-12-03 MED ORDER — LIDOCAINE HCL (PF) 1 % IJ SOLN
INTRAMUSCULAR | Status: AC
  Filled 2020-12-03: qty 30

## 2020-12-03 MED ORDER — LIDOCAINE HCL (PF) 1 % IJ SOLN
INTRAMUSCULAR | Status: DC | PRN
  Administered 2020-12-03: 2 mL via INTRADERMAL

## 2020-12-03 SURGICAL SUPPLY — 9 items
BAG SNAP BAND KOVER 36X36 (MISCELLANEOUS) ×2 IMPLANT
COVER DOME SNAP 22 D (MISCELLANEOUS) ×2 IMPLANT
KIT MICROPUNCTURE NIT STIFF (SHEATH) ×2 IMPLANT
PROTECTION STATION PRESSURIZED (MISCELLANEOUS) ×2
SHEATH PROBE COVER 6X72 (BAG) ×2 IMPLANT
STATION PROTECTION PRESSURIZED (MISCELLANEOUS) ×1 IMPLANT
STOPCOCK MORSE 400PSI 3WAY (MISCELLANEOUS) ×2 IMPLANT
TRAY PV CATH (CUSTOM PROCEDURE TRAY) ×2 IMPLANT
TUBING CIL FLEX 10 FLL-RA (TUBING) ×2 IMPLANT

## 2020-12-03 NOTE — H&P (Signed)
History and Physical Interval Note:  12/03/2020 7:33 AM  Ashlee Miles  has presented today for surgery, with the diagnosis of peripheral vascular disease.  The various methods of treatment have been discussed with the patient and family. After consideration of risks, benefits and other options for treatment, the patient has consented to  Procedure(s): A/V SHUNTOGRAM- (Left) as a surgical intervention.  The patient's history has been reviewed, patient examined, no change in status, stable for surgery.  I have reviewed the patient's chart and labs.  Questions were answered to the patient's satisfaction.    Left thigh shuntogram  Ashlee Miles  Postoperative Access Visit     History of Present Illness    Ashlee Miles is a 80 y.o. year old female who presents for postoperative follow-up for: 1. Thrombectomy of left thigh AV graft 2.  Revision of left thigh AV graft with 6 mm Gore-Tex interposition bypass around ulcerated segment of graft by Dr. Carlis Abbott on 10/25/20. The patient's wounds are well healed. The patient notes no steal symptoms. They are using her graft for dialysis without any issues with its function however she has had continuous prolonged bleeding requiring pressure hemostasis followed by compressive dressings. She is on Coumadin and was having issues during her hospitalization with bleeding secondary to supratherapeautic INR.    Physical Examination       Vitals:    11/24/20 0955  BP: (!) 179/65  Pulse: 70  Resp: 20  Temp: 98.5 F (36.9 C)  TempSrc: Temporal  SpO2: 100%  Weight: 200 lb (90.7 kg)  Height: '5\' 4"'$  (1.626 m)    Body mass index is 34.33 kg/m.   left arm Incision is well healed, no bleeding, palpable thrill, bruit can be auscultated      Medical Decision Making    Ashlee Miles is a 80 y.o. year old female who presents s/p 1. Thrombectomy of left thigh AV graft 2.  Revision of left thigh AV graft with 6 mm Gore-Tex interposition bypass around  ulcerated segment of graft by Dr. Carlis Abbott on 10/25/20. Left thigh incisions well healed. Patent is without signs or symptoms of steal syndrome. She has been having prolonged bleeding after dialysis. While in hospital we suspected that this was due to transitioning from Heparin to coumadin and having supratherapeautic INR levels. However with continued bleeding issues I recommend she have a shuntogram of left thigh graft to see if any stenosis causing the bleeding. She is on coumadin. She dialyzes on MWF so will try to arranged this around her dialysis schedule     Karoline Caldwell, PA-C [facility] [place] Office: [facility] MD: Carlis Abbott / Stanford Breed

## 2020-12-03 NOTE — Progress Notes (Signed)
Notified Aparna Vanderweele of Ca level 0.77. no new orders.

## 2020-12-03 NOTE — Op Note (Signed)
    OPERATIVE NOTE   PROCEDURE: left thigh arteriovenous graft cannulation under ultrasound guidance left thigh fistulogram  PRE-OPERATIVE DIAGNOSIS: Prolonged bleeding needle holes left thigh AVG  POST-OPERATIVE DIAGNOSIS: same as above   SURGEON: Marty Heck, MD  ANESTHESIA: local  ESTIMATED BLOOD LOSS: 5 cc  FINDING(S): The 3 stents on the venous side of the left thigh graft were widely patent.  There was about a 30% stenosis in the graft near the venous anastomosis but this did not appear flow-limiting.  Otherwise the remainder of the graft was widely patent.  The recent jump graft on the arterial side was also widely patent.  There was an excellent thrill.  I suspect her prolonged bleeding is from supratherapeutic INR.  Even after holding her Coumadin for 3 days her INR is 3.9 today.  There is a small hematoma on the arterial side of the graft and discussed with patient dialysis needs to not access around this segment until the hematoma has time to resolve.  SPECIMEN(S):  None  CONTRAST: 30 cc  INDICATIONS: Ashlee Miles is a 80 y.o. female who  presents with malfunctioning left thigh arteriovenous graft.  The patient is scheduled for left thigh fistulogram.  The patient is aware the risks include but are not limited to: bleeding, infection, thrombosis of the cannulated access, and possible anaphylactic reaction to the contrast.  The patient is aware of the risks of the procedure and elects to proceed forward.    DESCRIPTION: After full informed written consent was obtained, the patient was brought back to the angiography suite and placed supine upon the angiography table.  The patient was connected to monitoring equipment.  The left thigh was prepped and draped in the standard fashion for a left thigh fistulogram.  Under ultrasound guidance, the fistula was evaluated, it was patent, an image was saved.  The left thigh arteriovenous graft was cannulated with a  micropuncture needle.  The microwire was advanced into the fistula and the needle was exchanged for the a microsheath, which was lodged 2 cm into the access.  The wire was removed and the sheath was connected to the IV extension tubing.  Hand injections were completed to image the access.  The central venous structures were also imaged by hand injections.  Based on the images, this patient will need: no intervention.  A 4-0 Monocryl purse-string suture was sewn around the sheath.  The sheath was removed while tying down the suture.  A sterile bandage was applied to the puncture site.  COMPLICATIONS: None  CONDITION: Stable  Marty Heck, MD [facility] Office: [place]  12/03/2020 8:27 AM

## 2021-01-18 DEATH — deceased
